# Patient Record
Sex: Female | Born: 1940 | Race: White | Hispanic: No | Smoking: Former smoker
Health system: Southern US, Community
[De-identification: ages and names within clinical notes are randomized; demographics above are authoritative.]

## PROBLEM LIST (undated history)

## (undated) DIAGNOSIS — M1990 Unspecified osteoarthritis, unspecified site: Secondary | ICD-10-CM

## (undated) DIAGNOSIS — I38 Endocarditis, valve unspecified: Secondary | ICD-10-CM

## (undated) DIAGNOSIS — Z8669 Personal history of other diseases of the nervous system and sense organs: Secondary | ICD-10-CM

## (undated) DIAGNOSIS — K219 Gastro-esophageal reflux disease without esophagitis: Secondary | ICD-10-CM

## (undated) DIAGNOSIS — Z8719 Personal history of other diseases of the digestive system: Secondary | ICD-10-CM

## (undated) DIAGNOSIS — N83202 Unspecified ovarian cyst, left side: Secondary | ICD-10-CM

## (undated) DIAGNOSIS — I5022 Chronic systolic (congestive) heart failure: Secondary | ICD-10-CM

## (undated) DIAGNOSIS — I340 Nonrheumatic mitral (valve) insufficiency: Secondary | ICD-10-CM

## (undated) DIAGNOSIS — T8189XA Other complications of procedures, not elsewhere classified, initial encounter: Secondary | ICD-10-CM

## (undated) DIAGNOSIS — E119 Type 2 diabetes mellitus without complications: Secondary | ICD-10-CM

## (undated) DIAGNOSIS — I071 Rheumatic tricuspid insufficiency: Secondary | ICD-10-CM

## (undated) DIAGNOSIS — R0602 Shortness of breath: Secondary | ICD-10-CM

## (undated) DIAGNOSIS — Z972 Presence of dental prosthetic device (complete) (partial): Secondary | ICD-10-CM

## (undated) DIAGNOSIS — I10 Essential (primary) hypertension: Secondary | ICD-10-CM

## (undated) DIAGNOSIS — F419 Anxiety disorder, unspecified: Secondary | ICD-10-CM

## (undated) DIAGNOSIS — I4891 Unspecified atrial fibrillation: Secondary | ICD-10-CM

## (undated) DIAGNOSIS — M4800 Spinal stenosis, site unspecified: Secondary | ICD-10-CM

## (undated) DIAGNOSIS — K279 Peptic ulcer, site unspecified, unspecified as acute or chronic, without hemorrhage or perforation: Secondary | ICD-10-CM

## (undated) DIAGNOSIS — I493 Ventricular premature depolarization: Secondary | ICD-10-CM

## (undated) DIAGNOSIS — H409 Unspecified glaucoma: Secondary | ICD-10-CM

## (undated) DIAGNOSIS — I428 Other cardiomyopathies: Secondary | ICD-10-CM

## (undated) DIAGNOSIS — E039 Hypothyroidism, unspecified: Secondary | ICD-10-CM

## (undated) DIAGNOSIS — N3941 Urge incontinence: Secondary | ICD-10-CM

## (undated) HISTORY — DX: Unspecified glaucoma: H40.9

## (undated) HISTORY — DX: Unspecified osteoarthritis, unspecified site: M19.90

## (undated) HISTORY — PX: TUBAL LIGATION: SHX77

## (undated) HISTORY — PX: VENTRAL HERNIA REPAIR: SHX424

## (undated) HISTORY — PX: HERNIA REPAIR: SHX51

## (undated) HISTORY — PX: TONSILLECTOMY: SUR1361

## (undated) HISTORY — DX: Spinal stenosis, site unspecified: M48.00

## (undated) HISTORY — PX: ABDOMINAL HERNIA REPAIR: SHX539

## (undated) HISTORY — PX: CARDIOVASCULAR STRESS TEST: SHX262

## (undated) HISTORY — PX: THYROIDECTOMY: SHX17

## (undated) HISTORY — DX: Anxiety disorder, unspecified: F41.9

## (undated) HISTORY — PX: OTHER SURGICAL HISTORY: SHX169

## (undated) HISTORY — DX: Ventricular premature depolarization: I49.3

## (undated) HISTORY — DX: Essential (primary) hypertension: I10

## (undated) HISTORY — DX: Other cardiomyopathies: I42.8

## (undated) HISTORY — DX: Endocarditis, valve unspecified: I38

---

## 1976-06-16 HISTORY — PX: GLAUCOMA SURGERY: SHX656

## 1977-06-16 HISTORY — PX: APPENDECTOMY: SHX54

## 1998-01-09 ENCOUNTER — Other Ambulatory Visit: Admission: RE | Admit: 1998-01-09 | Discharge: 1998-01-09 | Payer: Self-pay | Admitting: *Deleted

## 1998-02-02 ENCOUNTER — Other Ambulatory Visit: Admission: RE | Admit: 1998-02-02 | Discharge: 1998-02-02 | Payer: Self-pay | Admitting: *Deleted

## 1998-10-31 ENCOUNTER — Ambulatory Visit (HOSPITAL_COMMUNITY): Admission: RE | Admit: 1998-10-31 | Discharge: 1998-10-31 | Payer: Self-pay | Admitting: Cardiology

## 1999-07-02 ENCOUNTER — Other Ambulatory Visit: Admission: RE | Admit: 1999-07-02 | Discharge: 1999-07-02 | Payer: Self-pay | Admitting: *Deleted

## 2001-03-21 ENCOUNTER — Encounter: Payer: Self-pay | Admitting: Family Medicine

## 2001-03-21 ENCOUNTER — Encounter: Admission: RE | Admit: 2001-03-21 | Discharge: 2001-03-21 | Payer: Self-pay | Admitting: Family Medicine

## 2001-08-11 ENCOUNTER — Other Ambulatory Visit: Admission: RE | Admit: 2001-08-11 | Discharge: 2001-08-11 | Payer: Self-pay | Admitting: *Deleted

## 2002-08-17 ENCOUNTER — Other Ambulatory Visit: Admission: RE | Admit: 2002-08-17 | Discharge: 2002-08-17 | Payer: Self-pay | Admitting: *Deleted

## 2003-09-06 ENCOUNTER — Other Ambulatory Visit: Admission: RE | Admit: 2003-09-06 | Discharge: 2003-09-06 | Payer: Self-pay | Admitting: *Deleted

## 2004-06-05 ENCOUNTER — Ambulatory Visit (HOSPITAL_BASED_OUTPATIENT_CLINIC_OR_DEPARTMENT_OTHER): Admission: RE | Admit: 2004-06-05 | Discharge: 2004-06-05 | Payer: Self-pay | Admitting: Orthopedic Surgery

## 2004-06-05 ENCOUNTER — Ambulatory Visit (HOSPITAL_COMMUNITY): Admission: RE | Admit: 2004-06-05 | Discharge: 2004-06-05 | Payer: Self-pay | Admitting: Orthopedic Surgery

## 2004-06-05 HISTORY — PX: KNEE ARTHROSCOPY W/ MENISCECTOMY: SHX1879

## 2004-09-16 ENCOUNTER — Other Ambulatory Visit: Admission: RE | Admit: 2004-09-16 | Discharge: 2004-09-16 | Payer: Self-pay | Admitting: *Deleted

## 2005-12-10 ENCOUNTER — Other Ambulatory Visit: Admission: RE | Admit: 2005-12-10 | Discharge: 2005-12-10 | Payer: Self-pay | Admitting: *Deleted

## 2008-04-13 ENCOUNTER — Inpatient Hospital Stay (HOSPITAL_COMMUNITY): Admission: AD | Admit: 2008-04-13 | Discharge: 2008-04-15 | Payer: Self-pay | Admitting: Family Medicine

## 2008-04-13 ENCOUNTER — Ambulatory Visit: Payer: Self-pay | Admitting: Family Medicine

## 2008-04-21 ENCOUNTER — Encounter: Admission: RE | Admit: 2008-04-21 | Discharge: 2008-04-21 | Payer: Self-pay | Admitting: General Surgery

## 2008-05-12 ENCOUNTER — Encounter: Admission: RE | Admit: 2008-05-12 | Discharge: 2008-05-12 | Payer: Self-pay | Admitting: General Surgery

## 2008-08-18 ENCOUNTER — Encounter: Admission: RE | Admit: 2008-08-18 | Discharge: 2008-08-18 | Payer: Self-pay | Admitting: Family Medicine

## 2008-08-23 ENCOUNTER — Encounter: Admission: RE | Admit: 2008-08-23 | Discharge: 2008-08-23 | Payer: Self-pay | Admitting: Family Medicine

## 2009-01-01 ENCOUNTER — Encounter: Admission: RE | Admit: 2009-01-01 | Discharge: 2009-01-01 | Payer: Self-pay | Admitting: Family Medicine

## 2009-12-03 ENCOUNTER — Encounter: Admission: RE | Admit: 2009-12-03 | Discharge: 2009-12-03 | Payer: Self-pay | Admitting: General Surgery

## 2009-12-24 ENCOUNTER — Encounter: Admission: RE | Admit: 2009-12-24 | Discharge: 2009-12-24 | Payer: Self-pay | Admitting: Family Medicine

## 2010-02-01 ENCOUNTER — Ambulatory Visit: Payer: Self-pay | Admitting: Cardiology

## 2010-02-22 ENCOUNTER — Ambulatory Visit: Payer: Self-pay | Admitting: Cardiology

## 2010-04-12 ENCOUNTER — Ambulatory Visit: Payer: Self-pay | Admitting: Cardiovascular Disease

## 2010-07-12 DIAGNOSIS — R5383 Other fatigue: Secondary | ICD-10-CM

## 2010-07-12 DIAGNOSIS — I509 Heart failure, unspecified: Secondary | ICD-10-CM | POA: Insufficient documentation

## 2010-07-12 DIAGNOSIS — M19049 Primary osteoarthritis, unspecified hand: Secondary | ICD-10-CM | POA: Insufficient documentation

## 2010-07-12 DIAGNOSIS — E119 Type 2 diabetes mellitus without complications: Secondary | ICD-10-CM | POA: Insufficient documentation

## 2010-07-12 DIAGNOSIS — H409 Unspecified glaucoma: Secondary | ICD-10-CM | POA: Insufficient documentation

## 2010-07-12 DIAGNOSIS — R5381 Other malaise: Secondary | ICD-10-CM | POA: Insufficient documentation

## 2010-07-12 DIAGNOSIS — I10 Essential (primary) hypertension: Secondary | ICD-10-CM | POA: Insufficient documentation

## 2010-07-12 DIAGNOSIS — M19079 Primary osteoarthritis, unspecified ankle and foot: Secondary | ICD-10-CM | POA: Insufficient documentation

## 2010-07-16 ENCOUNTER — Ambulatory Visit: Payer: Self-pay | Admitting: Cardiology

## 2010-07-17 ENCOUNTER — Other Ambulatory Visit (HOSPITAL_COMMUNITY): Payer: Self-pay

## 2010-07-26 ENCOUNTER — Ambulatory Visit (HOSPITAL_COMMUNITY): Payer: Medicare Other | Attending: Cardiology

## 2010-07-26 DIAGNOSIS — E119 Type 2 diabetes mellitus without complications: Secondary | ICD-10-CM | POA: Insufficient documentation

## 2010-07-26 DIAGNOSIS — I079 Rheumatic tricuspid valve disease, unspecified: Secondary | ICD-10-CM | POA: Insufficient documentation

## 2010-07-26 DIAGNOSIS — I428 Other cardiomyopathies: Secondary | ICD-10-CM | POA: Insufficient documentation

## 2010-07-26 DIAGNOSIS — I080 Rheumatic disorders of both mitral and aortic valves: Secondary | ICD-10-CM | POA: Insufficient documentation

## 2010-07-26 DIAGNOSIS — I10 Essential (primary) hypertension: Secondary | ICD-10-CM | POA: Insufficient documentation

## 2010-07-26 DIAGNOSIS — I379 Nonrheumatic pulmonary valve disorder, unspecified: Secondary | ICD-10-CM | POA: Insufficient documentation

## 2010-07-26 DIAGNOSIS — F17200 Nicotine dependence, unspecified, uncomplicated: Secondary | ICD-10-CM | POA: Insufficient documentation

## 2010-09-12 ENCOUNTER — Encounter: Payer: Self-pay | Admitting: Cardiology

## 2010-09-12 ENCOUNTER — Encounter: Payer: Self-pay | Admitting: Nurse Practitioner

## 2010-09-12 ENCOUNTER — Ambulatory Visit (INDEPENDENT_AMBULATORY_CARE_PROVIDER_SITE_OTHER): Payer: Medicare Other | Admitting: Nurse Practitioner

## 2010-09-12 ENCOUNTER — Telehealth: Payer: Self-pay | Admitting: *Deleted

## 2010-09-12 VITALS — BP 120/78 | HR 72 | Ht 63.0 in | Wt 143.6 lb

## 2010-09-12 DIAGNOSIS — Z79899 Other long term (current) drug therapy: Secondary | ICD-10-CM

## 2010-09-12 DIAGNOSIS — I428 Other cardiomyopathies: Secondary | ICD-10-CM

## 2010-09-12 DIAGNOSIS — E119 Type 2 diabetes mellitus without complications: Secondary | ICD-10-CM

## 2010-09-12 DIAGNOSIS — Z720 Tobacco use: Secondary | ICD-10-CM

## 2010-09-12 DIAGNOSIS — F17200 Nicotine dependence, unspecified, uncomplicated: Secondary | ICD-10-CM

## 2010-09-12 DIAGNOSIS — I10 Essential (primary) hypertension: Secondary | ICD-10-CM

## 2010-09-12 LAB — LIPID PANEL: Triglycerides: 150 mg/dL — ABNORMAL HIGH (ref 0.0–149.0)

## 2010-09-12 LAB — COMPREHENSIVE METABOLIC PANEL
Calcium: 9.3 mg/dL (ref 8.4–10.5)
Creatinine, Ser: 0.9 mg/dL (ref 0.4–1.2)
Sodium: 144 mEq/L (ref 135–145)
Total Protein: 6.6 g/dL (ref 6.0–8.3)

## 2010-09-12 MED ORDER — CARVEDILOL 12.5 MG PO TABS: 12.5000 mg | ORAL_TABLET | Freq: Two times a day (BID) | ORAL | Status: DC

## 2010-09-12 NOTE — Telephone Encounter (Signed)
Pt notified of appointment with Dr. Caryl Comes on June 8 at 1015 am to discuss ICD implantation.

## 2010-09-12 NOTE — Progress Notes (Signed)
History of Present Illness: Teresa [name] comes in today for a follow up visit. She is seen for Dr. Doreatha Lew. She seems fairly well compensated from the standpoint of her heart failure. Her repeat echo last month still shows an EF of 25%. We discussed possible ICD implant and she is interested once she is out for the summer. She denies chest pain. She is not really short of breath. She does have some cramps in her feet. Her weight has been stable. She has had one episode of getting dizzy after she had been bent over working in the yard. She has not noticed any palpitations.  Current Outpatient Prescriptions on File Prior to Visit  Medication Sig Dispense Refill  . ALPRAZolam (NIRAVAM) 0.25 MG dissolvable tablet Take 0.25 mg by mouth 3 (three) times daily.        . fish oil-omega-3 fatty acids 1000 MG capsule Take 2 g by mouth 2 (two) times daily.        . furosemide (LASIX) 20 MG tablet Take 40 mg by mouth as directed. Taking one tablet (40mg ) in the am and taking 1/2 tab at night      . HYDROcodone-acetaminophen (VICODIN) 5-500 MG per tablet Take 1 tablet by mouth 3 (three) times daily.        Marland Kitchen levothyroxine (SYNTHROID, LEVOTHROID) 112 MCG tablet Take 112 mcg by mouth daily.        Marland Kitchen losartan (COZAAR) 50 MG tablet Take 50 mg by mouth daily.        . carvedilol (COREG) 12.5 MG tablet Take 1 tablet (12.5 mg total) by mouth 2 (two) times daily.  60 tablet  11    No Known Allergies  Past Medical History  Diagnosis Date  . Nonischemic cardiomyopathy     EF is 25% per echo February 2012  . HTN (hypertension)   . PVC (premature ventricular contraction)   . Diabetes mellitus   . Tobacco abuse   . Heart palpitations   . Valvular heart disease     Moderate to severe MR, moderate TR, LAE per echo 7/11  . Glaucoma   . Glaucoma     history of  . Cardiomyopathy, nonischemic   . PVC (premature ventricular contraction)   . Spinal stenosis     Past Surgical History  Procedure Date  . Knee arthroscopy    . Tubal ligation     History  Smoking status  . Current Everyday Smoker  . Types: Cigarettes  Smokeless tobacco  . Never Used    History  Alcohol Use No    Family History  Problem Relation Age of Onset  . Stroke Brother     Review of Systems: The review of systems is as above.  All other systems were reviewed and are negative.  Physical Exam: BP 120/78  Pulse 72  Ht 5\' 3"  (1.6 m)  Wt 143 lb 9.6 oz (65.137 kg)  BMI 25.44 kg/m2 She is pleasant and in no acute distress. Skin is warm and dry. Color is normal. She smells of tobacco. HEENT is unremarkable. Lungs are coarse. Cardiac exam shows a regular rate and rhythm. She has occasional ectopics. She has no edema. Gait and ROM are intact. She has no gross neurologic deficits.   ECG: N/A  ECHO from 2/12 shows EF still at 25%  Assessment / Plan:

## 2010-09-12 NOTE — Telephone Encounter (Signed)
Message copied by Raelene Bott on Thu Sep 12, 2010  2:00 PM ------      Message from: Truitt Merle      Created: Thu Sep 12, 2010  9:49 AM       Needs an appointment in June with Dr. Caryl Comes to discuss ICD implantation.             Teresa [name]

## 2010-09-12 NOTE — Assessment & Plan Note (Signed)
Blood pressure is satisfactory.

## 2010-09-12 NOTE — Assessment & Plan Note (Signed)
We discussed ICD implant. She would like to see Dr. Caryl Comes in June. I have increased her Coreg to 12.5mg  bid. I will see her back in 1 month to reassess. I think we need to try and maximize her medical regimen.

## 2010-09-12 NOTE — Assessment & Plan Note (Signed)
She is counseled once again about her tobacco. She is not ready to quit at this time.

## 2010-09-12 NOTE — Patient Instructions (Addendum)
Smoking cessation is encouraged. Continue with your current meds except we are going to increase your Coreg to 12.5 mg BID I will see you in one month.

## 2010-09-16 ENCOUNTER — Telehealth: Payer: Self-pay | Admitting: *Deleted

## 2010-09-16 NOTE — Telephone Encounter (Signed)
LM to call back for lab results.  

## 2010-09-16 NOTE — Telephone Encounter (Signed)
Pt notified of lab results and will try to restart Simvastatin 20 mg daily.  Pt will f/u with Cecille Rubin on 10/18/10.

## 2010-09-16 NOTE — Telephone Encounter (Signed)
Message copied by Raelene Bott on Mon Sep 16, 2010  2:05 PM ------      Message from: Truitt Merle      Created: Fri Sep 13, 2010  8:07 AM       Lipids are not good. Does not look like she is on therapy. Will discuss at return visit in one month.

## 2010-10-11 ENCOUNTER — Other Ambulatory Visit: Payer: Self-pay | Admitting: *Deleted

## 2010-10-11 DIAGNOSIS — I509 Heart failure, unspecified: Secondary | ICD-10-CM

## 2010-10-11 MED ORDER — FUROSEMIDE 40 MG PO TABS: ORAL_TABLET | ORAL | Status: DC

## 2010-10-18 ENCOUNTER — Encounter: Payer: Self-pay | Admitting: Nurse Practitioner

## 2010-10-18 ENCOUNTER — Telehealth: Payer: Self-pay | Admitting: *Deleted

## 2010-10-18 ENCOUNTER — Ambulatory Visit (INDEPENDENT_AMBULATORY_CARE_PROVIDER_SITE_OTHER): Payer: Medicare Other | Admitting: Nurse Practitioner

## 2010-10-18 DIAGNOSIS — I10 Essential (primary) hypertension: Secondary | ICD-10-CM

## 2010-10-18 DIAGNOSIS — I428 Other cardiomyopathies: Secondary | ICD-10-CM

## 2010-10-18 DIAGNOSIS — R0600 Dyspnea, unspecified: Secondary | ICD-10-CM

## 2010-10-18 DIAGNOSIS — F17200 Nicotine dependence, unspecified, uncomplicated: Secondary | ICD-10-CM

## 2010-10-18 DIAGNOSIS — R079 Chest pain, unspecified: Secondary | ICD-10-CM | POA: Insufficient documentation

## 2010-10-18 DIAGNOSIS — Z720 Tobacco use: Secondary | ICD-10-CM

## 2010-10-18 DIAGNOSIS — R0609 Other forms of dyspnea: Secondary | ICD-10-CM

## 2010-10-18 DIAGNOSIS — I509 Heart failure, unspecified: Secondary | ICD-10-CM

## 2010-10-18 LAB — BASIC METABOLIC PANEL
BUN: 22 mg/dL (ref 6–23)
CO2: 26 mEq/L (ref 19–32)
Calcium: 9 mg/dL (ref 8.4–10.5)
Chloride: 105 mEq/L (ref 96–112)
Creatinine, Ser: 0.8 mg/dL (ref 0.4–1.2)
GFR: 74.31 mL/min (ref >60.00)
Glucose, Bld: 115 mg/dL — ABNORMAL HIGH (ref 70–99)
Potassium: 4 mEq/L (ref 3.5–5.1)
Sodium: 139 mEq/L (ref 135–145)

## 2010-10-18 LAB — BRAIN NATRIURETIC PEPTIDE: Pro B Natriuretic peptide (BNP): 268 pg/mL — ABNORMAL HIGH (ref 0.0–100.0)

## 2010-10-18 MED ORDER — CARVEDILOL 25 MG PO TABS: 25.0000 mg | ORAL_TABLET | Freq: Two times a day (BID) | ORAL | Status: DC

## 2010-10-18 NOTE — Patient Instructions (Addendum)
We are going to check some labs today. Continue with your current medicines, except we are going to increase the Coreg to 25 mg BID I have sent a new prescription to [facility]. Use 2 of your 12.5 mg tablets 2 x day to use them up. Weigh yourself each morning and record. Take extra dose of diuretic for weight gain of 3 pounds in 24 hours. Limit sodium intake.  See Dr. Caryl Comes next week. I will see you in 2 months.

## 2010-10-18 NOTE — Assessment & Plan Note (Signed)
Once again, she is encouraged to stop.

## 2010-10-18 NOTE — Assessment & Plan Note (Addendum)
She appears to be NYHA Class 2. I have increased her Coreg on up to 25 mg BID. New prescription is sent to [facility]. I have asked her to take an extra dose of her Lasix today to get her weight back down. She is on ARB. We will consider adding Aldactone on her return visit. She will see Dr. Caryl Comes next week for ICD consideration. Patient is agreeable to this plan and will call if any problems develop in the interim.

## 2010-10-18 NOTE — Assessment & Plan Note (Signed)
Coreg is increased today.

## 2010-10-18 NOTE — Progress Notes (Signed)
Teresa [name] Date of Birth: 08-01-1940   History of Present Illness: Teresa [name] is seen back today for a follow up visit. She is seen for Dr. Doreatha [name]. She is doing well. She actually feels a little stronger. She is only short of breath with going up hills. She is now on 12.5mg  of Coreg BID. She has an appointment to see Dr. Caryl [name] on Tuesday for an ICD. She notes that her weight has gone up over night. She has not used extra Lasix. She has a few "pings" of chest pain. No edema. She is not bloated. She can lie flat and has no PND. She continues to smoke.   Current Outpatient Prescriptions on File Prior to Visit  Medication Sig Dispense Refill  . ALPRAZolam (NIRAVAM) 0.25 MG dissolvable tablet Take 0.25 mg by mouth at bedtime as needed.       . carvedilol (COREG) 12.5 MG tablet Take 1 tablet (12.5 mg total) by mouth 2 (two) times daily.  60 tablet  11  . fish oil-omega-3 fatty acids 1000 MG capsule Take 2 g by mouth 2 (two) times daily.        Marland Kitchen HYDROcodone-acetaminophen (VICODIN) 5-500 MG per tablet Take 1 tablet by mouth 3 (three) times daily.        Marland Kitchen levothyroxine (SYNTHROID, LEVOTHROID) 112 MCG tablet Take 112 mcg by mouth daily.        Marland Kitchen losartan (COZAAR) 50 MG tablet Take 50 mg by mouth daily.        . simvastatin (ZOCOR) 40 MG tablet Take 40 mg by mouth 3 (three) times a week.        Marland Kitchen DISCONTD: furosemide (LASIX) 40 MG tablet Take one to two tablets daily as needed.  60 tablet  6    No Known Allergies  Past Medical History  Diagnosis Date  . Nonischemic cardiomyopathy     EF is 25% per echo February 2012  . HTN (hypertension)   . PVC (premature ventricular contraction)   . Diabetes mellitus   . Tobacco abuse   . Heart palpitations   . Valvular heart disease     Moderate to severe MR, moderate TR, LAE per echo 7/11  . Glaucoma   . Glaucoma     history of  . Cardiomyopathy, nonischemic   . PVC (premature ventricular contraction)   . Spinal stenosis     Past Surgical  History  Procedure Date  . Knee arthroscopy   . Tubal ligation     History  Smoking status  . Current Everyday Smoker  . Types: Cigarettes  Smokeless tobacco  . Never Used    History  Alcohol Use No    Family History  Problem Relation Age of Onset  . Stroke Brother     Review of Systems: The review of systems is positive for chronic pain. She has just switched to a new PCP.  She is not lightheaded. She denies palpitations. She continues to work with no significant limitations. All other systems were reviewed and are negative.  Physical Exam: BP 140/90  Pulse 73  Ht 5\' 3"  (1.6 m)  Wt 148 lb 9.6 oz (67.405 kg)  BMI 26.32 kg/m2 Patient is very pleasant and in no acute distress. She smells of tobacco. Skin is warm and dry. Color is normal.  HEENT is unremarkable. Normocephalic/atraumatic. PERRL. Sclera are nonicteric. Neck is supple. No masses. No JVD. Lungs are coarse with a few wheezes. Cardiac exam shows a regular rate and rhythm.  Abdomen is soft. Extremities are without edema. Gait and ROM are intact. No gross neurologic deficits noted.  LABORATORY DATA:   BMET and BNP are pending. EKG today shows sinus with a rate of 72. She has diffuse T wave changes. The tracing is unchanged from prior tracing.    Assessment / Plan:

## 2010-10-18 NOTE — Telephone Encounter (Signed)
Labs reported

## 2010-10-18 NOTE — Assessment & Plan Note (Signed)
I don't think her "pings" are angina. She will continue to monitor.

## 2010-10-22 ENCOUNTER — Ambulatory Visit (INDEPENDENT_AMBULATORY_CARE_PROVIDER_SITE_OTHER): Payer: Medicare Other | Admitting: Internal Medicine

## 2010-10-22 ENCOUNTER — Encounter: Payer: Self-pay | Admitting: Internal Medicine

## 2010-10-22 DIAGNOSIS — I10 Essential (primary) hypertension: Secondary | ICD-10-CM

## 2010-10-22 DIAGNOSIS — Z720 Tobacco use: Secondary | ICD-10-CM

## 2010-10-22 DIAGNOSIS — F17200 Nicotine dependence, unspecified, uncomplicated: Secondary | ICD-10-CM

## 2010-10-22 DIAGNOSIS — I428 Other cardiomyopathies: Secondary | ICD-10-CM

## 2010-10-22 MED ORDER — SPIRONOLACTONE 25 MG PO TABS: ORAL_TABLET | ORAL | Status: DC

## 2010-10-22 NOTE — Assessment & Plan Note (Signed)
Encouraged to stop smoking

## 2010-10-22 NOTE — Patient Instructions (Addendum)
Your physician has recommended you make the following change in your medication: Start Aldactone 25mg  1/2 tablet by mouth once daily.  Your physician recommends that you return for lab work in: 2 weeks- BMP (425.4;401.1)  Your physician has requested that you have an echocardiogram just prior to your follow up appointment. Echocardiography is a painless test that uses sound waves to create images of your heart. It provides your doctor with information about the size and shape of your heart and how well your heart's chambers and valves are working. This procedure takes approximately one hour. There are no restrictions for this procedure.  Your physician recommends that you schedule a follow-up appointment in: 10 weeks.

## 2010-10-22 NOTE — Assessment & Plan Note (Signed)
The patient has persistent cardiomyopathy. There has been gradual up titration of her medications with a change last week from 12-1/2-25 carvedilol. I would like to add Aldactone and reassess her LV function in about 10 weeks. If he remains significantly impaired, at that point ICD implantation is clearly appropriate as maximal oncological therapy would have been  pursued.

## 2010-10-22 NOTE — Progress Notes (Signed)
HPI: Teresa [name] is a 70 y.o. female Seen at the request of Dr. Doreatha Lew for consideration of ICD implantation.  She presented to cardiac care July 2011 symptoms of orthopnea nocturnal dyspnea dyspnea on exertion and peripheral edema. She was found to have an ejection fraction of 20-25% with moderate to severe mitral and tricuspid regurgitation. A Myoview was consistent with nonischemic cardiomyopathy. She's been treated medically. Her ejection fraction has remained in the 20-25% range over the last 9 months despite a extensive medical therapy. Her symptoms have largely resolved she is currently functioning class II.  She has a history of irregular palpitations which she thinks is atrial fibrillation; she also thinks she may have PVCs. She has not had syncope.  Currently she has no orthopnea nocturnal dyspnea or peripheral edema.  Her last echo was earlier this month demonstrated ejection fraction 20-25% with now mild MR/TR Current Outpatient Prescriptions  Medication Sig Dispense Refill  . ALPRAZolam (NIRAVAM) 0.25 MG dissolvable tablet Take 0.25 mg by mouth at bedtime as needed.       . carvedilol (COREG) 25 MG tablet Take 1 tablet (25 mg total) by mouth 2 (two) times daily.  60 tablet  11  . fish oil-omega-3 fatty acids 1000 MG capsule Take 2 g by mouth 2 (two) times daily.        . furosemide (LASIX) 40 MG tablet 40 mg 2 (two) times daily. Take one  IN THE AM, AND 1/2 AT NIGHT.      Marland Kitchen HYDROcodone-acetaminophen (VICODIN) 5-500 MG per tablet Take 1 tablet by mouth 3 (three) times daily.        Marland Kitchen levothyroxine (SYNTHROID, LEVOTHROID) 112 MCG tablet Take 112 mcg by mouth daily.        Marland Kitchen losartan (COZAAR) 50 MG tablet Take 50 mg by mouth daily.        . simvastatin (ZOCOR) 40 MG tablet Take 40 mg by mouth 3 (three) times a week.          No Known Allergies  Past Medical History  Diagnosis Date  . Nonischemic cardiomyopathy     EF is 25% per echo February 2012  . HTN (hypertension)   .  PVC (premature ventricular contraction)   . Diabetes mellitus   . Tobacco abuse   . Heart palpitations   . Valvular heart disease     Moderate to severe MR, moderate TR, LAE per echo 7/11  . Glaucoma   . Glaucoma     history of  . Cardiomyopathy, nonischemic   . PVC (premature ventricular contraction)   . Spinal stenosis     Past Surgical History  Procedure Date  . Knee arthroscopy   . Tubal ligation     Family History  Problem Relation Age of Onset  . Stroke Brother     History   Social History  . Marital Status: Married    Spouse Name: N/A    Number of Children: N/A  . Years of Education: N/A   Occupational History  . Not on file.   Social History Main Topics  . Smoking status: Current Everyday Smoker    Types: Cigarettes  . Smokeless tobacco: Never Used  . Alcohol Use: No  . Drug Use: No  . Sexually Active:    Other Topics Concern  . Not on file   Social History Narrative  . No narrative on file    Fourteen point review of systems was negative except as noted in HPI and PMH  apart from anxiety; pain in her feet for bone spurs and chronic systemic pain for which she takes narcotics  PHYSICAL EXAMINATION  Blood pressure 140/68, pulse 66, height 5\' 3"  (1.6 m), weight 146 lb 12.8 oz (66.588 kg).   Well developed and nourished The Caucasian female appearing her stated age in no acute distress HENT normal with alopecia Neck supple with JVP-flat Carotids brisk and full without bruits Back without scoliosis or kyphosis Clear Irregular rate and rhythm consistent with PVCs PMI is displaced , no murmurs or gallops Abd-soft with active BS without hepatomegaly or midline pulsation Femoral pulses 2+ distal pulses intact No Clubbing cyanosis edema Skin-warm and dry LN-neg submandibular and supraclavicular A & Oriented CN 3-12 normal  Grossly normal sensory and motor function Affect engaging . ECG from last week demonstrated sinus rhythm at 73.16  5.09/0.37 Nonspecific ST-T changes

## 2010-10-22 NOTE — Assessment & Plan Note (Signed)
Blood pressure remained mildly elevated which gives [place] a chance to Aldactone. We'll check a metabolic profile in 2 weeks.

## 2010-10-29 NOTE — H&P (Signed)
NAMEEVEA, STANCO                ACCOUNT NO.:  [id number]   MEDICAL RECORD NO.:  [id number]          PATIENT TYPE:  INP   LOCATION:  4710                         FACILITY:  [facility]   PHYSICIAN:  Dalbert Mayotte, MD        DATE OF BIRTH:  1940-12-19   DATE OF ADMISSION:  04/13/2008  DATE OF DISCHARGE:                              HISTORY & PHYSICAL   PRIMARY CARE PHYSICIAN:  Suszanne Conners, MD   CHIEF COMPLAINT:  Abdominal pain, nausea, and vomiting.   HISTORY OF PRESENT ILLNESS:  Ms. Parchment is a 70 year old female with a  history of multiple abdominal surgeries, hypertension, diabetes,  hyperlipidemia, and hypothyroidism who presents with abdominal pain,  nausea, and vomiting since this past Tuesday night.  She states that she  ate dinner and then after that started having abdominal pain and felt  like she had gas, but Gas-X did not help.  She then continually has been  vomiting, initially bringing up food and then bringing up dark green  substance today.  Most recently, she has been having vomiting, but no  nausea and the pain has become worsen in her left lower quadrant.  She  has not passed gas or had a bowel movement since this past Tuesday.  She  denied any melena, blood in her stool, diarrhea, or constipation  previous to this.  She also states that she has not been urinating since  this morning.  She denies fever, but does report feeling cold and  clammy.  She went to urgent care today where a KUB was done that was  suspicious for a small bowel obstruction.  She was then directly  admitted to the hospital.   PAST MEDICAL HISTORY:  1. Hyperlipidemia.  2. Hypothyroidism status post thyroidectomy.  3. Osteoarthritis.  4. Hypertension, now not taking any medications since weight loss.  5. GERD.  6. History of peptic ulcer disease, but no currently active ulcers.  7. Type 2 diabetes.   PAST SURGICAL HISTORY:  1. Appendectomy approximately 30 years ago.  2. Five to six  abdominal hernia repairs in 5-10 years following her      appendectomy.  3. Thyroidectomy.  4. Medial meniscal tear repair in 2005.   MEDICATIONS:  1. Zocor 40 mg p.o. daily.  2. Metformin 500 mg p.o. b.i.d.  3. Synthroid 125 mg p.o. daily.  4. Pepcid AC.  5. Xanax p.r.n.  6. Darvocet p.r.n.  7. Hydrocodone p.r.n.  8. Gas-X p.r.n.   ALLERGIES:  No known drug allergies.   SOCIAL HISTORY:  The patient lives home alone and her husband is in the  nursing home.  She smokes 1/2 pack a day with a long-standing smoking  history.  She denies any alcohol or drugs.   FAMILY HISTORY:  The patient states diabetes, stroke, MI, cancer all run  in her family.  She has 7 siblings.  She states that she has a daughter  with cancer, which she describes as a tumor between her heart and her  lungs.  She also has a son with heart failure.   REVIEW  OF SYSTEMS:  GENERAL:  Endorses chills and sweats.  Denies  fevers.  HEENT:  Denies headache or sore throat.  CARDIOVASCULAR:  Denies chest pain or edema.  RESPIRATORY:  Denies cough or dyspnea.  GI:  Nausea, vomiting, and abdominal pain as per HPI.  Denies diarrhea,  hemoptysis, bright red per rectum, or melena.  GU:  Denies dysuria,  however, notes decreased urine output today.  SKIN:  Denies rash.  MUSCULOSKELETAL:  Denies myalgias.  Endorses multiple arthralgias due to  her osteoarthritis.  NEURO:  Endorses some feeling or weakness.  ENDO:  Denies heat or cold intolerance.   PHYSICAL EXAMINATION:  VITAL SIGNS:  Temperature 98.0, pulse 102-108,  respirations 20, blood pressure 136-146/64-72, and pulse ox 94-95% on  room air.  GENERAL:  Lying in bed, in no acute distress.  HEENT:  Somewhat dry and tacky mucous membranes.  No erythema or exudate  of the oropharynx.  Pupils equal, round, and reactive to light and  accommodation.  Extraocular movements intact.  NECK:  Supple.  No carotid bruits.  CARDIOVASCULAR:  Regular rate and rhythm.  No murmurs,  rubs, or gallops.  LUNGS:  Clear to auscultation bilaterally.  No wheezes, rhonchi, or  rales.  ABDOMEN:  Soft and nondistended with slight tenderness to palpation in  the left lower quadrant mainly, but diffuse.  No rebound or guarding.  No masses or hepatosplenomegaly.  Bowel sounds present but decreased.  BACK:  No lesions.  GU:  Deferred.  RECTAL:  Deferred.  EXTREMITIES:  1+ nonpitting lower extremity edema bilaterally.  DP  pulses 2+ bilaterally.  NEURO:  Cranial nerves II through XII intact.  Normal sensation  throughout.  MUSCULOSKELETAL:  Normal strength throughout.   LABORATORY DATA AND STUDIES:  EKG shows some possible ST depression in  leads V4 through V6 but difficult to tell if this is a change from  previous EKGs.  KUB per verbal report from urgent care, large dilated  bowel and air-fluid levels.  The formal report is not available.   ASSESSMENT AND PLAN:  Ms. Pellicane is a 70 year old female with history of  multiple abdominal surgeries who presents with abdominal pain, nausea,  and vomiting x3 days.  1. Small bowel obstruction:  This is the likely diagnosis based on x-      ray findings and the patient's history of multiple abdominal      surgeries.  We will order an acute abdominal series now and a      repeat in the morning.  The patient is n.p.o. entirely and we are      holding all of her p.o. medications.  We will drop an NG tube and      set it to intermittent suction.  The patient should receive      abdominal exams every 2 hours.  Surgery has been consulted and they      agree with this plan, and we will further evaluate the patient in      the morning.  We will also hydrate the patient with IV fluids 1 L      bolus of normal saline now followed by normal saline at 125 mL per      hour.  The patient will have strict Is and Os, and we will order a      CBC and a complete metabolic panel now.  We have given the patient      Zofran, Ativan, and morphine as  needed.  2. EKG, ST  changes:  This represents possibly new ST depression on      EKG.  Given the patient's comorbidities, we will rule out      myocardial infarction though the patient has not endorsed any chest      pain.  We will cycle cardiac enzymes x3 and repeat an EKG in the      morning.  The patient is also on telemetry.  3. Diabetes:  We will hold metformin in case the patient requires any      studies that require contrast.  The patient is on sliding scale      insulin.  4. Hypothyroidism:  We will continue the patient's Synthroid.  Fluids,      electrolytes, nutrition and prophylaxis:  The patient is n.p.o.      now, we are hydrating the patient with 1 L of bolus of normal      saline followed by 125 mL per hour of normal saline.  We will also      give the patient Protonix 40 mg IV daily.      Carin Hock, MD  Electronically Signed      Dalbert Mayotte, MD  Electronically Signed    SO/MEDQ  D:  04/13/2008  T:  04/14/2008  Job:  [id number]

## 2010-10-29 NOTE — Discharge Summary (Signed)
NAMESOILA, Teresa [name]                ACCOUNT NO.:  [id number]   MEDICAL RECORD NO.:  [id number]          PATIENT TYPE:  INP   LOCATION:  4710                         FACILITY:  [facility]   PHYSICIAN:  Jamal Collin. Hensel, M.D.DATE OF BIRTH:  [birth year]   DATE OF ADMISSION:  04/13/2008  DATE OF DISCHARGE:  04/15/2008                               DISCHARGE SUMMARY   DISCHARGE DIAGNOSES:  1. Partial small bowel obstruction, resolving.  2. Hyperlipidemia.  3. Hypothyroid.  4. Hypertension.  5. Type 2 diabetes.  6. Gastroesophageal reflux disease.  7. EKG changes - asymptomatic.   DISCHARGE MEDICATIONS:  1. Metformin 500 mg p.o. b.i.d.  2. Synthroid 0.125 mg p.o. daily.  3. Zocor 40 mg p.o. daily.  4. Darvocet p.r.n.  5. Xanax 0.5 mg p.r.n.  6. Gas-X p.r.n.  7. Pepcid a.c. p.o. nightly.   FOLLOWUP:  The patient was instructed on importance stressed to follow  up with [facility] on Sunday, April 16, 2008.  She will bring in the pink discharge sheet and we will hopefully get a  KUB x-ray.  The patient was also instructed to follow up with the office  of her primary care physician, Dr. Earlie Counts, which she will call for an  appointment in 1-2 weeks.   HOSPITAL COURSE:  This is a pleasant 70 year old female who was admitted  with severe nausea, vomiting, and unable to have a bowel movement and  found to have a partial small bowel obstruction at the [facility].  Repeat imaging also showed a partial small bowel obstruction.  Please see following for details:  1. Small bowel obstruction:  The patient was admitted and initially      was going to have an NG tube placed.  However, she had a bowel      movement fairly quickly and there was no need to place an NG tube.      Instead, we kept her n.p.o.  We kept her on aggressive IV fluid      hydration.  We repeated imaging her during hospital stay and we      consulted [facility].  The surgeons  felt that she was      not in need of surgery at this point and they recommended bowel      rest as well as repeat imaging.  Over hospital course, although the      repeat imaging did not appear to change the severity of the partial      small bowel obstruction, she clinically improved.  She was having      both loose and formed bowel movements.  She did not have any more      nausea and vomiting and she was tolerating a clear liquid diet.  On      day of discharge, the patient told us she did not want to stay and      she promised to have close followup the next day.  The surgeons      felt like one more day of observation with a repeat  KUB would      benefit her.  The patient was under undue stress with a sick family      member at the nursing home and we felt it would to be beneficial to      her to be able to go home and have close followup.  The most recent      KUB showed dilated bowel loops that was essentially unchanged.      However, the patient was remarkably better clinically.  2. Diabetes:  We held the patient's metformin while she was in the      hospital as she was n.p.o. and tolerating minimal diet.  She      received sliding scale only and her sugars remained in the 80s      throughout the hospital stay.  She was instructed to resume her      metformin once she was tolerating a regular diet.  3. Hypothyroid:  We initially put her on IV Synthroid; however, we      switched this to a p.o. dose once she was tolerating p.o.  4. Gastroesophageal reflux disease:  We we put the patient on Protonix      while she was here.  5. Hypertension:  The patient was on any medications and her blood      pressure remained well controlled during this hospital stay.  6. EKG changes:  It was noted that although the patient did not have      chest pain, a routine EKG was obtained in the emergency room given      the fact that she had nausea and vomiting and it was noted that      there were  some inverted T-waves in V1 through V6.  On compared to      previous x-rays, these were new findings.  However, the patient's      cardiac enzymes remained negative and she never had chest pain nor      shortness of breath throughout this hospital stay.  We hope that      her primary care physician can follow up this EKG and repeat      another one and likely have an outpatient stress test for further      cardiac workup.   PERTINENT IMAGES:  Initial acute abdominal series showed markedly  dilated loops of small bowel in the upper abdomen and stair-step air  fluid levels that coincide with the partial small bowel obstruction  without free intraperitoneal air that was on April 13, 2008, and  April 14, 2008.  Repeat imaging showed no significant change on  April 15, 2008, a KUB was performed which again showed no interval  change in the small bowel obstruction.  However, she clinically remained  improved.   PERTINENT LABORATORY DATA:  On the day of admission, April 13, 2008,  white count was normal at 7.7, hemoglobin 11.8, and platelets over  83,000, otherwise labs unremarkable.  Comprehensive metabolic panel on  April 13, 2008, showed a low potassium at 3.2 and elevated creatinine  1.69.  Normal LFTs.  AST 15 and ALT 12.  On the day of discharge, white  blood cells remained normal and hemoglobin remained stable at 11.4.  Basic metabolic panel showed potassium normal at 4.5 and her creatinine  normal now at 0.66.  Point-of-care enzymes remained negative.   DISCHARGE CONDITION:  Stable and improved.   ACTIVITY:  As tolerated.   FOLLOWUP:  Again, I would like  to reiterate that a fairly lengthy  discussion was had with this patient to discuss her request for early  discharge.  She understands the risk of having one less day of  observation to watch her small bowel obstruction.  She understands that  if she goes home she must have close followup the following day on  April 16, 2008, at the [facility] to  receive another KUB.  She understands that things and red flags to look  out for include severe nausea, vomiting, and unable to have bowel  movements as well as abdominal pain.  She understands that she is at  risk of having a small bowel obstruction worsen without having a  surgeon at hand to help her.  I do feel that she is stable and I have  talked our attending who agrees she is stable for discharge; however, we  did not want to go over these risks and red flags with her.  She is in  agreement and will follow up in the morning.      Kasandra Knudsen, M.D.  Electronically Signed      Jamal Collin. Andria Frames, M.D.  Electronically Signed    JT/MEDQ  D:  04/15/2008  T:  04/16/2008  Job:  [id number]   cc:   [facility]  Suszanne Conners, M.D.

## 2010-10-30 ENCOUNTER — Telehealth: Payer: Self-pay | Admitting: Internal Medicine

## 2010-10-30 NOTE — Telephone Encounter (Signed)
Pt calling to see if echo on 5-29 is too soon thought it was to be closer to July appt due to new med she's on, pls advise

## 2010-10-30 NOTE — Telephone Encounter (Signed)
I called and spoke with the pt. She does need her echo closer to her July appt. I have r/s her to 12/27/10.

## 2010-11-01 NOTE — Op Note (Signed)
NAMEWALAA, RIX                ACCOUNT NO.:  [id number]   MEDICAL RECORD NO.:  [id number]          PATIENT TYPE:  AMB   LOCATION:  DSC                          FACILITY:  [facility]   PHYSICIAN:  Alta Corning, M.D.   DATE OF BIRTH:  07-08-1940   DATE OF PROCEDURE:  06/05/2004  DATE OF DISCHARGE:                                 OPERATIVE REPORT   PREOPERATIVE DIAGNOSIS:  Medial meniscal tear.   POSTOPERATIVE DIAGNOSES:  1.  Medial meniscal tear.  2.  Lateral meniscal tear.  3.  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  4.  Chondromalacia of the patellofemoral compartment.   PRINCIPAL PROCEDURE:  1.  Partial medial and partial lateral meniscectomy with corresponding      debridement in the medial lateral compartment.  2.  Debridement of chondromalacia of patella.   SURGEON:  Alta Corning, M.D.   ASSISTANT:  Gary Fleet, PA.   ANESTHESIA:  General.   BRIEF HISTORY:  Ms. Clarey is a 70 year old female with a long history of  having significant for right knee pain.  She was evaluated in the office and  felt to have a medial meniscal tear.  She failed conservative care because  of continuing complaints of pain she was ultimately taken to the operating  room for arthroscopic evaluation of her right knee.   PROCEDURE:  The patient was taken to the operating room and general  anesthesia was obtained with endotracheal anesthesia.  The patient was  placed supine on the operating table.  The right leg was prepped and draped  in the usual sterile fashion.  Following this routine arthroscopic  examination of the knee revealed there was an obvious posterior horn medial  meniscal tear with a flap folded anteriorly.  The patellofemoral joint  showed some grade III and IV change over laterally.  There was also a  meniscal tear which was flapped anteriorly.  The medial compartment was  debrided back to smooth, stable rim and the medial femoral condyle was  debrided back to  smooth, stable rim.  There was still some articular flaking  around the corner even with about 100 degrees of flexion.  We really could  not get back to it but we did clean up all that we could get to.  On the  lateral side there was minimal chondromalacia with fairly significant  anterior horn lateral meniscal tear which was debrided.  Up in the  patellofemoral joint debridement was undertaken.  There was descent patella  tracking and there was no significant patellofemoral compression, no lateral  release was performed.   At this point the knee was copiously irrigated and suctioned dry.  Followup  check was made for any loose, fragment pieces to make sure the menisci were  stable both medial and lateral.  This was checked with a probe.  Once this  was accomplished the knee was copiously irrigated and suctioned dry and  closed with a bandage.  Sterile compressive dressing was applied.   The patient was taken to the recovery room and was noted to be in  satisfactory  condition.   ESTIMATED BLOOD LOSS:  None.      Juanita Craver  D:  06/05/2004  T:  06/06/2004  Job:  [id number]

## 2010-11-08 ENCOUNTER — Telehealth: Payer: Self-pay | Admitting: Cardiology

## 2010-11-08 NOTE — Telephone Encounter (Signed)
PT SAID SHE FORGOT TO TAKE HER HEART MEDS FOR AN ENTIRE DAY AND WANTS TO KNOW IF THIS WAS HARMFUL TO HER IN ANY WAY, CHART IN BOX.

## 2010-11-08 NOTE — Telephone Encounter (Signed)
Instructed pt to take her afternoon meds as instructed and not to double up since she missed the morning doses;  She verbalized and understanding of this

## 2010-11-12 ENCOUNTER — Other Ambulatory Visit (HOSPITAL_COMMUNITY): Payer: Medicare Other

## 2010-11-12 ENCOUNTER — Telehealth: Payer: Self-pay | Admitting: Internal Medicine

## 2010-11-12 ENCOUNTER — Other Ambulatory Visit (INDEPENDENT_AMBULATORY_CARE_PROVIDER_SITE_OTHER): Payer: Medicare Other | Admitting: *Deleted

## 2010-11-12 DIAGNOSIS — I428 Other cardiomyopathies: Secondary | ICD-10-CM

## 2010-11-12 DIAGNOSIS — I10 Essential (primary) hypertension: Secondary | ICD-10-CM

## 2010-11-12 LAB — BASIC METABOLIC PANEL
BUN: 34 mg/dL — ABNORMAL HIGH (ref 6–23)
CO2: 24 mEq/L (ref 19–32)
Chloride: 105 mEq/L (ref 96–112)
Potassium: 4.2 mEq/L (ref 3.5–5.1)

## 2010-11-12 NOTE — Telephone Encounter (Signed)
Copy of labs faxed to [place] [facility] at [phone].

## 2010-11-21 ENCOUNTER — Telehealth: Payer: Self-pay | Admitting: Internal Medicine

## 2010-11-21 NOTE — Telephone Encounter (Signed)
Pt rtn call to get test results

## 2010-11-21 NOTE — Telephone Encounter (Signed)
Lab results given to pt. She verbalized understanding.

## 2010-11-22 ENCOUNTER — Institutional Professional Consult (permissible substitution): Payer: Medicare Other | Admitting: Internal Medicine

## 2010-11-25 ENCOUNTER — Telehealth: Payer: Self-pay | Admitting: Cardiology

## 2010-11-25 NOTE — Telephone Encounter (Signed)
Pt requesting to receive a letter to be dismissed from [employer].  Dr. Doreatha Lew notified and instructed RN it was ok to excuse pt from [facility]  RN notified pt and pt will call back with the information needed for the letter.

## 2010-11-25 NOTE — Telephone Encounter (Signed)
Called in because she got called in for jury duty on July the 11th and she dosen't believe that she could sit for the entire duration of the trial. She takes medication that makes her use the restroom frequently and would like to receive a letter to be dismissed from jury duty. Please call back.

## 2010-11-26 ENCOUNTER — Encounter: Payer: Self-pay | Admitting: *Deleted

## 2010-11-26 ENCOUNTER — Telehealth: Payer: Self-pay | Admitting: *Deleted

## 2010-11-26 DIAGNOSIS — I428 Other cardiomyopathies: Secondary | ICD-10-CM

## 2010-11-26 NOTE — Telephone Encounter (Signed)
See most recent bmp.

## 2010-11-26 NOTE — Telephone Encounter (Signed)
RN mailed jury duty dismissal letter to pt per pt's request.

## 2010-12-04 ENCOUNTER — Telehealth: Payer: Self-pay | Admitting: Cardiology

## 2010-12-04 NOTE — Telephone Encounter (Signed)
Spoke with pt, pt is c/o pain with urination, frequency, and blood in urine.  RN advised pt to f/u with PCP ASAP.

## 2010-12-04 NOTE — Telephone Encounter (Signed)
Pt thinks she may have uti please call

## 2010-12-09 ENCOUNTER — Other Ambulatory Visit (INDEPENDENT_AMBULATORY_CARE_PROVIDER_SITE_OTHER): Payer: Medicare Other | Admitting: *Deleted

## 2010-12-09 ENCOUNTER — Telehealth: Payer: Self-pay | Admitting: Cardiology

## 2010-12-09 ENCOUNTER — Telehealth: Payer: Self-pay | Admitting: Internal Medicine

## 2010-12-09 DIAGNOSIS — I428 Other cardiomyopathies: Secondary | ICD-10-CM

## 2010-12-09 LAB — BASIC METABOLIC PANEL
CO2: 22 mEq/L (ref 19–32)
Calcium: 9.1 mg/dL (ref 8.4–10.5)
Creatinine, Ser: 0.9 mg/dL (ref 0.4–1.2)
GFR: 69.32 mL/min (ref >60.00)
Glucose, Bld: 113 mg/dL — ABNORMAL HIGH (ref 70–99)

## 2010-12-09 NOTE — Telephone Encounter (Signed)
Walk In pt Form " Pt dropped off Application for Handicapped Palcard" please completed and Mail when completed sent to Message Nurse 12/09/10/km

## 2010-12-09 NOTE — Telephone Encounter (Signed)
Dr. Doreatha Lew suggested pt follow up with Dr. Percival Spanish.  Pt would like to go ahead and have an appointment scheduled for Dr. Percival Spanish for mid August.  RN notified pt of appointment on 01/27/11 at 51 am at [facility].

## 2010-12-09 NOTE — Telephone Encounter (Signed)
PT CONFUSED AS TO WHO IS TAKING OVER HER CARE SINCE DR TENNANT HAS LEFT.

## 2010-12-12 ENCOUNTER — Telehealth: Payer: Self-pay | Admitting: *Deleted

## 2010-12-12 NOTE — Telephone Encounter (Signed)
[name] handicap placard renewal was mailed to her.

## 2010-12-16 ENCOUNTER — Ambulatory Visit: Payer: Medicare Other | Admitting: Internal Medicine

## 2010-12-23 ENCOUNTER — Ambulatory Visit: Payer: Medicare Other | Admitting: Nurse Practitioner

## 2010-12-24 ENCOUNTER — Ambulatory Visit: Payer: Medicare Other | Admitting: Internal Medicine

## 2010-12-27 ENCOUNTER — Ambulatory Visit (HOSPITAL_COMMUNITY): Payer: Medicare Other | Attending: Internal Medicine | Admitting: Radiology

## 2010-12-27 DIAGNOSIS — I079 Rheumatic tricuspid valve disease, unspecified: Secondary | ICD-10-CM | POA: Insufficient documentation

## 2010-12-27 DIAGNOSIS — I379 Nonrheumatic pulmonary valve disorder, unspecified: Secondary | ICD-10-CM | POA: Insufficient documentation

## 2010-12-27 DIAGNOSIS — I10 Essential (primary) hypertension: Secondary | ICD-10-CM | POA: Insufficient documentation

## 2010-12-27 DIAGNOSIS — E119 Type 2 diabetes mellitus without complications: Secondary | ICD-10-CM | POA: Insufficient documentation

## 2010-12-27 DIAGNOSIS — I428 Other cardiomyopathies: Secondary | ICD-10-CM | POA: Insufficient documentation

## 2010-12-27 DIAGNOSIS — I080 Rheumatic disorders of both mitral and aortic valves: Secondary | ICD-10-CM | POA: Insufficient documentation

## 2010-12-27 HISTORY — PX: TRANSTHORACIC ECHOCARDIOGRAM: SHX275

## 2011-01-03 ENCOUNTER — Ambulatory Visit: Payer: Medicare Other | Admitting: Internal Medicine

## 2011-01-23 ENCOUNTER — Other Ambulatory Visit: Payer: Self-pay | Admitting: Cardiology

## 2011-01-23 DIAGNOSIS — I119 Hypertensive heart disease without heart failure: Secondary | ICD-10-CM

## 2011-01-23 MED ORDER — LOSARTAN POTASSIUM 50 MG PO TABS: 50.0000 mg | ORAL_TABLET | Freq: Every day | ORAL | Status: DC

## 2011-01-23 NOTE — Telephone Encounter (Signed)
Wants RN to call [facility]. About her [place] Rx.  She said that the pharmacy called for a refill a couple of days ago and has not heard from [place] yet.

## 2011-01-23 NOTE — Telephone Encounter (Signed)
Left message on patients voice mail  Rx called to [facility]

## 2011-01-27 ENCOUNTER — Ambulatory Visit (INDEPENDENT_AMBULATORY_CARE_PROVIDER_SITE_OTHER): Payer: Medicare Other | Admitting: Cardiology

## 2011-01-27 ENCOUNTER — Encounter: Payer: Self-pay | Admitting: Cardiology

## 2011-01-27 VITALS — BP 120/68 | HR 60 | Ht 63.0 in | Wt 148.0 lb

## 2011-01-27 DIAGNOSIS — I428 Other cardiomyopathies: Secondary | ICD-10-CM

## 2011-01-27 DIAGNOSIS — F17200 Nicotine dependence, unspecified, uncomplicated: Secondary | ICD-10-CM

## 2011-01-27 DIAGNOSIS — I10 Essential (primary) hypertension: Secondary | ICD-10-CM

## 2011-01-27 DIAGNOSIS — Z720 Tobacco use: Secondary | ICD-10-CM

## 2011-01-27 NOTE — Assessment & Plan Note (Signed)
She seems to be euvolemic.  At this point, no change in therapy is indicated.  We have reviewed salt and fluid restrictions.  No further cardiovascular testing is indicated.   

## 2011-01-27 NOTE — Patient Instructions (Addendum)
Follow up in 6 months with Dr Percival Spanish.  You will receive a letter in the mail 2 months before you are due.  Please call us when you receive this letter to schedule your follow up appointment. The current medical regimen is effective;  continue present plan and medications.

## 2011-01-27 NOTE — Assessment & Plan Note (Signed)
The blood pressure is at target. No change in medications is indicated. We will continue with therapeutic lifestyle changes (TLC).  

## 2011-01-27 NOTE — Assessment & Plan Note (Signed)
We discussed this for greater than 3 minutes. She will try nicotine replacement first.  She will call me if she decides to take Chantix.

## 2011-01-27 NOTE — Progress Notes (Signed)
The patient presents for followup of a nonischemic cardiomyopathy. She was being considered for an ICD but her ejection fraction on echo last month was up to almost 50%.  She is continuing medical management and doing well with this.  The patient denies any new symptoms such as chest discomfort, neck or arm discomfort. There has been no new shortness of breath, PND or orthopnea. There have been no reported palpitations, presyncope or syncope.  I reviewed the echo results and reveiwed this with her today.    No Known Allergies  Current Outpatient Prescriptions  Medication Sig Dispense Refill  . ALPRAZolam (NIRAVAM) 0.25 MG dissolvable tablet Take 0.25 mg by mouth at bedtime as needed.       . carvedilol (COREG) 25 MG tablet Take 1 tablet (25 mg total) by mouth 2 (two) times daily.  60 tablet  11  . famotidine (PEPCID) 10 MG tablet Take 10 mg by mouth 2 (two) times daily.        . fish oil-omega-3 fatty acids 1000 MG capsule Take 2 g by mouth 2 (two) times daily.        . furosemide (LASIX) 40 MG tablet 40 mg 2 (two) times daily. Take one  IN THE AM, AND 1/2 AT NIGHT.      Marland Kitchen HYDROcodone-acetaminophen (VICODIN) 5-500 MG per tablet Take 1 tablet by mouth 3 (three) times daily.        Marland Kitchen levothyroxine (SYNTHROID, LEVOTHROID) 112 MCG tablet Take 112 mcg by mouth daily.       Marland Kitchen losartan (COZAAR) 50 MG tablet Take 1 tablet (50 mg total) by mouth daily.  30 tablet  1  . spironolactone (ALDACTONE) 25 MG tablet Take 1/2 tablet by mouth once daily  30 tablet  6    Past Medical History  Diagnosis Date  . Nonischemic cardiomyopathy     EF is 25% per echo February 2012, non ischemic myoview 12/2009  . HTN (hypertension)   . PVC (premature ventricular contraction)   . Diabetes mellitus   . Tobacco abuse   . Heart palpitations   . Valvular heart disease     Moderate to severe MR, moderate TR, LAE per echo 7/11  . Glaucoma   . Glaucoma     history of  . Spinal stenosis     Past Surgical History    Procedure Date  . Knee arthroscopy   . Tubal ligation   . Glaucoma surgery   . Abdominal hernia repair   . Right knee surgery   . Thyroidectomy     ROS:  Heartburn.  Otherwise as stated in the HPI and negative for all other systems.  PHYSICAL EXAM BP 120/68  Pulse 60  Ht 5\' 3"  (1.6 m)  Wt 148 lb (67.132 kg)  BMI 26.22 kg/m2 GENERAL:  Well appearing HEENT:  Pupils equal round and reactive, fundi not visualized, oral mucosa unremarkable NECK:  No jugular venous distention, waveform within normal limits, carotid upstroke brisk and symmetric, no bruits, no thyromegaly LYMPHATICS:  No cervical, inguinal adenopathy LUNGS:  Clear to auscultation bilaterally BACK:  No CVA tenderness CHEST:  Unremarkable HEART:  PMI not displaced or sustained,S1 and S2 within normal limits, no S3, no S4, no clicks, no rubs, no murmurs ABD:  Flat, positive bowel sounds normal in frequency in pitch, no bruits, no rebound, no guarding, no midline pulsatile mass, no hepatomegaly, no splenomegaly EXT:  2 plus pulses throughout, no edema, no cyanosis no clubbing SKIN:  No rashes no  nodules NEURO:  Cranial nerves II through XII grossly intact, motor grossly intact throughout PSYCH:  Cognitively intact, oriented to person place and time   EKG:  Sinus bradycardia, left deviation, left ventricular hypertrophy, and inferolateral T wave inversions  ASSESSMENT AND PLAN

## 2011-03-16 ENCOUNTER — Other Ambulatory Visit: Payer: Self-pay | Admitting: Cardiology

## 2011-03-18 LAB — CARDIAC PANEL(CRET KIN+CKTOT+MB+TROPI)
CK, MB: 3
CK, MB: 3.8
Relative Index: INVALID
Total CK: 60
Total CK: 66
Troponin I: 0.03

## 2011-03-18 LAB — BASIC METABOLIC PANEL
BUN: 30 — ABNORMAL HIGH
Calcium: 7.9 — ABNORMAL LOW
Calcium: 7.9 — ABNORMAL LOW
Calcium: 8.5
Creatinine, Ser: 0.87
GFR calc Af Amer: >60
GFR calc non Af Amer: 41 — ABNORMAL LOW
GFR calc non Af Amer: >60
GFR calc non Af Amer: >60
Glucose, Bld: 91
Potassium: 4.5
Sodium: 140
Sodium: 143

## 2011-03-18 LAB — COMPREHENSIVE METABOLIC PANEL
AST: 15
CO2: 22
Chloride: 102
Creatinine, Ser: 1.69 — ABNORMAL HIGH
GFR calc Af Amer: 37 — ABNORMAL LOW
GFR calc non Af Amer: 30 — ABNORMAL LOW
Total Bilirubin: 0.6

## 2011-03-18 LAB — GLUCOSE, CAPILLARY
Glucose-Capillary: 83
Glucose-Capillary: 89

## 2011-03-18 LAB — CBC
HCT: 34 — ABNORMAL LOW
HCT: 36
Hemoglobin: 11.4 — ABNORMAL LOW
Hemoglobin: 11.8 — ABNORMAL LOW
Platelets: 158
RBC: 3.81 — ABNORMAL LOW
WBC: 5.1
WBC: 7.7

## 2011-04-15 ENCOUNTER — Inpatient Hospital Stay (HOSPITAL_COMMUNITY): Payer: Medicare Other

## 2011-04-15 ENCOUNTER — Inpatient Hospital Stay (HOSPITAL_COMMUNITY)
Admission: EM | Admit: 2011-04-15 | Discharge: 2011-05-02 | DRG: 329 | Disposition: A | Discharge status: Home Health | Payer: Medicare Other | Attending: Surgery | Admitting: Surgery

## 2011-04-15 ENCOUNTER — Emergency Department (HOSPITAL_COMMUNITY): Payer: Medicare Other

## 2011-04-15 DIAGNOSIS — R109 Unspecified abdominal pain: Secondary | ICD-10-CM

## 2011-04-15 DIAGNOSIS — I429 Cardiomyopathy, unspecified: Secondary | ICD-10-CM | POA: Diagnosis present

## 2011-04-15 DIAGNOSIS — I079 Rheumatic tricuspid valve disease, unspecified: Secondary | ICD-10-CM | POA: Diagnosis present

## 2011-04-15 DIAGNOSIS — E871 Hypo-osmolality and hyponatremia: Secondary | ICD-10-CM | POA: Diagnosis present

## 2011-04-15 DIAGNOSIS — I071 Rheumatic tricuspid insufficiency: Secondary | ICD-10-CM | POA: Diagnosis present

## 2011-04-15 DIAGNOSIS — K9189 Other postprocedural complications and disorders of digestive system: Secondary | ICD-10-CM | POA: Diagnosis not present

## 2011-04-15 DIAGNOSIS — I129 Hypertensive chronic kidney disease with stage 1 through stage 4 chronic kidney disease, or unspecified chronic kidney disease: Secondary | ICD-10-CM | POA: Diagnosis present

## 2011-04-15 DIAGNOSIS — I428 Other cardiomyopathies: Secondary | ICD-10-CM

## 2011-04-15 DIAGNOSIS — E039 Hypothyroidism, unspecified: Secondary | ICD-10-CM | POA: Diagnosis present

## 2011-04-15 DIAGNOSIS — N179 Acute kidney failure, unspecified: Secondary | ICD-10-CM | POA: Diagnosis present

## 2011-04-15 DIAGNOSIS — M1990 Unspecified osteoarthritis, unspecified site: Secondary | ICD-10-CM | POA: Diagnosis present

## 2011-04-15 DIAGNOSIS — K567 Ileus, unspecified: Secondary | ICD-10-CM | POA: Diagnosis not present

## 2011-04-15 DIAGNOSIS — E876 Hypokalemia: Secondary | ICD-10-CM

## 2011-04-15 DIAGNOSIS — K560 Paralytic ileus: Secondary | ICD-10-CM | POA: Diagnosis not present

## 2011-04-15 DIAGNOSIS — I2589 Other forms of chronic ischemic heart disease: Secondary | ICD-10-CM | POA: Diagnosis present

## 2011-04-15 DIAGNOSIS — I059 Rheumatic mitral valve disease, unspecified: Secondary | ICD-10-CM | POA: Diagnosis present

## 2011-04-15 DIAGNOSIS — F17200 Nicotine dependence, unspecified, uncomplicated: Secondary | ICD-10-CM | POA: Diagnosis present

## 2011-04-15 DIAGNOSIS — E785 Hyperlipidemia, unspecified: Secondary | ICD-10-CM | POA: Diagnosis present

## 2011-04-15 DIAGNOSIS — N189 Chronic kidney disease, unspecified: Secondary | ICD-10-CM | POA: Diagnosis present

## 2011-04-15 DIAGNOSIS — K631 Perforation of intestine (nontraumatic): Secondary | ICD-10-CM | POA: Diagnosis present

## 2011-04-15 DIAGNOSIS — K56609 Unspecified intestinal obstruction, unspecified as to partial versus complete obstruction: Secondary | ICD-10-CM

## 2011-04-15 DIAGNOSIS — K219 Gastro-esophageal reflux disease without esophagitis: Secondary | ICD-10-CM | POA: Diagnosis present

## 2011-04-15 DIAGNOSIS — K565 Intestinal adhesions [bands], unspecified as to partial versus complete obstruction: Principal | ICD-10-CM | POA: Diagnosis present

## 2011-04-15 DIAGNOSIS — I340 Nonrheumatic mitral (valve) insufficiency: Secondary | ICD-10-CM

## 2011-04-15 LAB — URINE MICROSCOPIC-ADD ON

## 2011-04-15 LAB — CBC
Hemoglobin: 15.7 g/dL — ABNORMAL HIGH (ref 12.0–15.0)
MCH: 33.4 pg (ref 26.0–34.0)
Platelets: 265 10*3/uL (ref 150–400)
RBC: 4.7 MIL/uL (ref 3.87–5.11)
WBC: 9 10*3/uL (ref 4.0–10.5)

## 2011-04-15 LAB — URINALYSIS, ROUTINE W REFLEX MICROSCOPIC
Glucose, UA: NEGATIVE mg/dL
Specific Gravity, Urine: 1.024 (ref 1.005–1.030)
pH: 5 (ref 5.0–8.0)

## 2011-04-15 LAB — BASIC METABOLIC PANEL
CO2: 30 mEq/L (ref 19–32)
Calcium: 9.2 mg/dL (ref 8.4–10.5)
Chloride: <65 mEq/L — CL (ref 96–112)
Glucose, Bld: 114 mg/dL — ABNORMAL HIGH (ref 70–99)
Potassium: 4.6 mEq/L (ref 3.5–5.1)
Sodium: 123 mEq/L — ABNORMAL LOW (ref 135–145)

## 2011-04-15 LAB — DIFFERENTIAL
Basophils Absolute: 0 10*3/uL (ref 0.0–0.1)
Basophils Relative: 0 % (ref 0–1)
Lymphocytes Relative: 26 % (ref 12–46)
Monocytes Relative: 17 % — ABNORMAL HIGH (ref 3–12)
Neutro Abs: 5.2 10*3/uL (ref 1.7–7.7)
Neutrophils Relative %: 57 % (ref 43–77)

## 2011-04-16 ENCOUNTER — Inpatient Hospital Stay (HOSPITAL_COMMUNITY): Payer: Medicare Other

## 2011-04-16 LAB — BASIC METABOLIC PANEL WITH GFR
BUN: 93 mg/dL — ABNORMAL HIGH (ref 6–23)
CO2: 25 meq/L (ref 19–32)
Calcium: 7.4 mg/dL — ABNORMAL LOW (ref 8.4–10.5)
Chloride: 80 meq/L — ABNORMAL LOW (ref 96–112)
Creatinine, Ser: 5.09 mg/dL — ABNORMAL HIGH (ref 0.50–1.10)
GFR calc Af Amer: 9 mL/min — ABNORMAL LOW
GFR calc non Af Amer: 8 mL/min — ABNORMAL LOW
Glucose, Bld: 84 mg/dL (ref 70–99)
Potassium: 3.4 meq/L — ABNORMAL LOW (ref 3.5–5.1)
Sodium: 127 meq/L — ABNORMAL LOW (ref 135–145)

## 2011-04-16 LAB — CBC
MCV: 91.6 fL (ref 78.0–100.0)
Platelets: 180 10*3/uL (ref 150–400)
RBC: 3.92 MIL/uL (ref 3.87–5.11)
RDW: 12.6 % (ref 11.5–15.5)
WBC: 6.6 10*3/uL (ref 4.0–10.5)

## 2011-04-16 LAB — PRO B NATRIURETIC PEPTIDE: Pro B Natriuretic peptide (BNP): 1097 pg/mL — ABNORMAL HIGH (ref 0–125)

## 2011-04-16 LAB — OSMOLALITY: Osmolality: 286 mosm/kg (ref 275–300)

## 2011-04-16 LAB — MAGNESIUM: Magnesium: 1.9 mg/dL (ref 1.5–2.5)

## 2011-04-16 LAB — OSMOLALITY, URINE: Osmolality, Ur: 363 mosm/kg — ABNORMAL LOW (ref 390–1090)

## 2011-04-16 LAB — HEMOGLOBIN A1C
Hgb A1c MFr Bld: 5.7 % — ABNORMAL HIGH
Mean Plasma Glucose: 117 mg/dL — ABNORMAL HIGH

## 2011-04-16 LAB — TSH: TSH: 0.663 u[IU]/mL (ref 0.350–4.500)

## 2011-04-17 ENCOUNTER — Inpatient Hospital Stay (HOSPITAL_COMMUNITY): Payer: Medicare Other

## 2011-04-17 LAB — BASIC METABOLIC PANEL
BUN: 49 mg/dL — ABNORMAL HIGH (ref 6–23)
Calcium: 8.4 mg/dL (ref 8.4–10.5)
Calcium: 8.4 mg/dL (ref 8.4–10.5)
GFR calc Af Amer: 23 mL/min — ABNORMAL LOW (ref >90)
GFR calc Af Amer: 30 mL/min — ABNORMAL LOW (ref >90)
GFR calc non Af Amer: 20 mL/min — ABNORMAL LOW (ref >90)
GFR calc non Af Amer: 25 mL/min — ABNORMAL LOW (ref >90)
Glucose, Bld: 139 mg/dL — ABNORMAL HIGH (ref 70–99)
Glucose, Bld: 133 mg/dL — ABNORMAL HIGH (ref 70–99)
Potassium: 2.5 mEq/L — CL (ref 3.5–5.1)
Sodium: 136 mEq/L (ref 135–145)
Sodium: 135 mEq/L (ref 135–145)

## 2011-04-17 LAB — CBC
Hemoglobin: 12.2 g/dL (ref 12.0–15.0)
MCH: 32.4 pg (ref 26.0–34.0)
MCHC: 34.1 g/dL (ref 30.0–36.0)
RDW: 12.3 % (ref 11.5–15.5)

## 2011-04-18 ENCOUNTER — Other Ambulatory Visit (INDEPENDENT_AMBULATORY_CARE_PROVIDER_SITE_OTHER): Payer: Self-pay | Admitting: General Surgery

## 2011-04-18 DIAGNOSIS — K55059 Acute (reversible) ischemia of intestine, part and extent unspecified: Secondary | ICD-10-CM

## 2011-04-18 HISTORY — PX: OTHER SURGICAL HISTORY: SHX169

## 2011-04-18 LAB — GLUCOSE, CAPILLARY: Glucose-Capillary: 139 mg/dL — ABNORMAL HIGH (ref 70–99)

## 2011-04-18 LAB — BASIC METABOLIC PANEL
BUN: 38 mg/dL — ABNORMAL HIGH (ref 6–23)
Chloride: 90 mEq/L — ABNORMAL LOW (ref 96–112)
Creatinine, Ser: 1.53 mg/dL — ABNORMAL HIGH (ref 0.50–1.10)
Glucose, Bld: 146 mg/dL — ABNORMAL HIGH (ref 70–99)
Potassium: 3 mEq/L — ABNORMAL LOW (ref 3.5–5.1)

## 2011-04-18 NOTE — Consult Note (Signed)
Teresa [name], Teresa [name] NO.:  [id number]  MEDICAL RECORD NO.:  [id number]  LOCATION:  [id number]                         FACILITY:  [facility]  PHYSICIAN:  Thayer Headings, M.D. DATE OF BIRTH:  [birth year]  DATE OF CONSULTATION:  04/15/2011 DATE OF DISCHARGE:                                CONSULTATION   HISTORY OF PRESENT ILLNESS:  Teresa [name] is a 70 year old female with a history of nonischemic cardiomyopathy.  She is admitted with small bowel obstruction.  The patient has a history of nonischemic cardiomyopathy in the past. She has been on medical therapy and her ejection fraction has improved dramatically.  Her last EF by echo was 45% to 50%.  She denies any cardiac complaints.  Specifically, she denies any chest pain, shortness of breath, syncope, or presyncope.  She has been able to do all of her normal activities without any significant problems.  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  She does her housework and her yard work without any problems.  The patient has a history of small bowel obstruction in the past.  She started having some abdominal pain and some constipation yesterday. Today, she had intense belly pain and came to the [facility] Emergency Room.  CURRENT MEDICATIONS: 1. Alprazolam 0.25 mg at bedtime as needed. 2. Carvedilol 25 mg 2 times a day. 3. Pepcid 10 mg 2 times a day. 4. Omega-3 fatty acids 2 g twice a day. 5. Lasix 40 mg in the morning and 20 mg at night. 6. Hydrocodone 1 tablet 3 times a day. 7. Synthroid 112 mcg a day. 8. Cozaar 50 mg a day. 9. Spironolactone 12.5 mg a day.  ALLERGIES:  She has no known drug allergies.  PAST MEDICAL HISTORY: 1. History of congestive heart failure.  Her ejection fraction was 25%     by echo in 2012.  She has now been on good medical therapy and her     last EF is 45% to 50%.  She had a nonischemic Myoview in July 2011. 2. Hypertension. 3. Premature ventricular contractions. 4.  Diabetes mellitus. 5. History of cigarette smoking. 6. History of moderate-to-severe mitral regurgitation with moderate     tricuspid regurgitation.  FAMILY HISTORY:  Negative for premature cardiac disease.  REVIEW OF SYSTEMS:  Reviewed in the HPI.  All other systems are negative.  PHYSICAL EXAMINATION:  GENERAL:  She is an elderly female in no acute distress.  She is feeling a lot better now that she has an NG placed. VITAL SIGNS:  Her blood pressure is 95/52, heart rate is 95. NECK:  2+ carotids.  She has no bruits.  No JVD.  There is no thyromegaly.  Supple. LUNG:  Clear.  Her back is nontender. HEART:  Regular rate.  S1, S2.  She has no murmurs.  Her PMI is nondisplaced. ABDOMEN:  She is somewhat tender.  Her abdomen is distended.  She has an NG in place. EXTREMITIES:  She has no clubbing, cyanosis, or edema.  There are no palpable cords. NEURO:  Nonfocal.  Cranial nerves II through XII are intact.  Her motor and sensory function intact.  LABORATORY DATA:  Her white  blood cell count is 9.0, hemoglobin is 15.5, hematocrit is 43%.  Remaining labs are pending.  IMAGING STUDIES:  Her chest x-ray is not performed yet.  IMPRESSION/PLAN:  History of cardiomyopathy.  The patient is very well compensated.  Her ejection fraction has increased from 25% up to around 50% on medical therapy.  I do not think that she will have any immediate complications.  Currently, she is NPO because her medicines need to be held.  At this point, I do not think that she needs any of her medications as her blood pressure is a little low.  Specifically, she does not need furosemide as long as she is not taking in fluids or food.  I would slowly add back her heart failure medications once she is taking p.o.  If she becomes tachycardic, we can use IV metoprolol.  I do not think that she will have any significant worsening of her congestive heart failure.  There was a question about the use of IV  fluids.  Certainly, she can receive the IV fluids, deemed appropriate by the hospitalist team.  Her creatinine has been normal as recently as December 09, 2010.  I doubt that she will need to go to Surgery.  If she does need surgery, I think that she is at low risk for any cardiovascular complications since her ejection fraction is fairly well preserved.  We will sign off at this time.  Please call us if there are any other questions.     Thayer Headings, M.D.     PJN/MEDQ  D:  04/15/2011  T:  04/16/2011  Job:  [id number]  cc:   Minus Breeding, MD, [place]. [place] [address]  Electronically Signed by Mertie Moores M.D. on 04/18/2011 03:20:30 PM

## 2011-04-18 NOTE — Consult Note (Signed)
NAMEJAHNICE, PRINE NO.:  [id number]  MEDICAL RECORD NO.:  [id number]  LOCATION:  WLED                         FACILITY:  [facility]  PHYSICIAN:  Reyne Dumas, MD       DATE OF BIRTH:  1940/07/16  DATE OF CONSULTATION:  04/15/2011 DATE OF DISCHARGE:                                CONSULTATION   PRIMARY CARE PHYSICIAN:  Wendie Agreste, M.D. at [facility].  PRIMARY CARDIOLOGIST:  Minus Breeding, MD, [facility]  CHIEF COMPLAINT:  Nausea and vomiting.  SUBJECTIVE:  This is a 70 year old female, who presents to the ER with a chief complaint of nausea and vomiting for the last 4 days.  The symptoms started last Saturday, and she has been unable to eat and keep any fluids down.  The patient has been having bilious vomiting associated with some abdominal pain.  She does have a history of small bowel obstruction and multiple abdominal surgeries in the past.  She has not had a BM since yesterday.  She is not passing any flatus.  The patient was admitted by the Surgical Service for her small bowel obstruction.  Currently, she has an NG tube in place and appears to be comfortable with no recurrent nausea, vomiting.  In the ER, she was found to be mildly hypertensive and in acute renal failure.  Therefore, medical consultation was requested.  The patient is unaware of her baseline creatinine.  She states that she has been holding her Lasix over the last 4 to 5 days because of the nausea, vomiting, and inability to keep any fluids down.  PAST MEDICAL HISTORY:  History of nonischemic cardiomyopathy, hypertension, premature ventricular contractions, diabetes mellitus, tobacco abuse, valvular heart disease with moderate-to-severe mitral regurgitation, moderate tricuspid regurgitation, glaucoma, history of spinal stenosis.  PAST SURGICAL HISTORY:  Knee arthroscopy, tubal ligation, glaucoma, surgery of abdominal hernia repair, right knee surgery, thyroidectomy.  REVIEW  OF SYSTEMS:  Fourteen point review of systems was done and documented in HPI.  ALLERGIES:  No known drug allergies.  CURRENT HOME MEDICATIONS:  Fish oil, Clear Eyes, Levoxyl, Lasix, losartan, Coreg, Aldactone, alprazolam, hydrocodone, Tums, Gas-X, and Zofran.  SOCIAL HISTORY:  The patient lives home alone and her husband is in the nursing home.  She smokes half a pack a day, and has been a long- standing smoker.  FAMILY HISTORY:  The patient states that diabetes, stroke, MI and cancer, all run in the family.  She has 7 siblings, and she has a daughter with cancer.  She describes __________ tumor between the heart and the lungs.  PHYSICAL EXAMINATION:  VITAL SIGNS:  Blood pressure 95/52, heart rate 75. GENERAL:  Comfortable currently.  No acute cardiopulmonary distress. HEENT:  NG tube in place. NECK:  Supple.  No JVD. LUNGS:  Clear to auscultation bilaterally.  No wheezes.  No crackles. No rhonchi. CARDIOVASCULAR:  Regular rate and rhythm.  No murmurs, rubs, or gallops. ABDOMEN:  Soft, hyperactive bowel sounds. NEUROLOGIC:  Cranial nerves II-XII grossly intact. PSYCHIATRIC:  Appropriate mood and affect.  LABORATORY DATA:  Abdominal films done in [facility] confirmed that the patient has small-bowel obstruction.  Sodium 123, potassium 4.6,  chloride less than 65, bicarbonate 30, glucose 114, BUN 90, creatinine 6.42, calcium 9.2.  Urinalysis shows moderate amount of bilirubin, small amount of leukocytes, 3 to 6 rbc's per high-power field.  Cardiac enzymes are negative. CBC:  WBC 9.0, hemoglobin 15.7, hematocrit 43.0, and a platelet count of 265.  ASSESSMENT AND PLAN: 1. History of nonischemic cardiomyopathy, evaluated by Cardiology in     the ED.  Okay to go for surgery if surgery is indicated from a     cardiac standpoint. 2. Hypertension.  Currently hypotensive because of small bowel     obstruction and dehydration. 3. History of premature ventricular contraction  without any evidence     of significant electrolyte abnormalities. 4. Hyponatremia. 5. Acute on chronic renal insufficiency with a baseline creatinine     that is unknown.  PLAN:  The patient will be admitted to Telemetry.  We will discontinue her losartan and her Lasix and her Aldactone in the setting of her acute renal failure.  We will continue on her Coreg with hold parameters.  The patient will be closely monitored for her volume status.  We will avoid any NSAIDs or ACE inhibitors because of her acute renal failure.  Renal ultrasound will be obtained because of her hyponatremia, which I feel is due to dehydration.  The patient will have a urine osmolality, urine creatinine, and a urine sodium.  The most likely the patient has acute renal failure secondary to prerenal etiology in the setting of the use of diuretics as well as ARBs.  The patient does not have any gross electrolyte abnormalities to require an urgent nephrology consultation. If the patient's creatinine does not improve over the course of the next 1 to 2 days, then a Nephrology consultation will be considered.  We thank [facility] for this consultation.  We will continue to follow the patient.     Reyne Dumas, MD     NA/MEDQ  D:  04/15/2011  T:  04/15/2011  Job:  [id number]  Electronically Signed by Reyne Dumas MD on 04/18/2011 10:45:22 AM

## 2011-04-19 LAB — BASIC METABOLIC PANEL
CO2: 24 mEq/L (ref 19–32)
Calcium: 7 mg/dL — ABNORMAL LOW (ref 8.4–10.5)
Chloride: 108 mEq/L (ref 96–112)
Potassium: 4.3 mEq/L (ref 3.5–5.1)
Sodium: 140 mEq/L (ref 135–145)

## 2011-04-19 LAB — URINALYSIS, MICROSCOPIC ONLY
Bilirubin Urine: NEGATIVE
Ketones, ur: NEGATIVE mg/dL
Nitrite: NEGATIVE
Urobilinogen, UA: 0.2 mg/dL (ref 0.0–1.0)

## 2011-04-19 LAB — PHOSPHORUS: Phosphorus: 1.9 mg/dL — ABNORMAL LOW (ref 2.3–4.6)

## 2011-04-19 LAB — CBC
Platelets: 188 10*3/uL (ref 150–400)
RBC: 3.96 MIL/uL (ref 3.87–5.11)
WBC: 21.5 10*3/uL — ABNORMAL HIGH (ref 4.0–10.5)

## 2011-04-19 LAB — MAGNESIUM: Magnesium: 1.6 mg/dL (ref 1.5–2.5)

## 2011-04-19 MED ORDER — FAMOTIDINE IN NACL 20-0.9 MG/50ML-% IV SOLN
20.0000 mg | Freq: Two times a day (BID) | INTRAVENOUS | Status: DC
  Administered 2011-04-20 – 2011-04-24 (×10): 20 mg via INTRAVENOUS
  Filled 2011-04-19 (×15): qty 50

## 2011-04-19 MED ORDER — DIPHENHYDRAMINE HCL 12.5 MG/5ML PO ELIX: 12.5000 mg | ORAL_SOLUTION | Freq: Four times a day (QID) | ORAL | Status: DC | PRN

## 2011-04-19 MED ORDER — CHLORHEXIDINE GLUCONATE 0.12 % MT SOLN
15.0000 mL | Freq: Two times a day (BID) | OROMUCOSAL | Status: DC
  Administered 2011-04-20 – 2011-05-01 (×11): 15 mL via OROMUCOSAL
  Filled 2011-04-19 (×29): qty 15

## 2011-04-19 MED ORDER — KCL IN DEXTROSE-NACL 10-5-0.45 MEQ/L-%-% IV SOLN
INTRAVENOUS | Status: DC
  Administered 2011-04-20 – 2011-04-23 (×4): via INTRAVENOUS
  Administered 2011-04-23: 100 mL via INTRAVENOUS
  Administered 2011-04-24: 20 mL/h via INTRAVENOUS
  Filled 2011-04-19 (×15): qty 1000

## 2011-04-19 MED ORDER — ONDANSETRON HCL 4 MG/2ML IJ SOLN: 4.0000 mg | Freq: Four times a day (QID) | INTRAMUSCULAR | Status: DC | PRN

## 2011-04-19 MED ORDER — PROMETHAZINE HCL 25 MG/ML IJ SOLN: 6.2500 mg | Freq: Four times a day (QID) | INTRAMUSCULAR | Status: DC | PRN

## 2011-04-19 MED ORDER — KCL IN DEXTROSE-NACL 30-5-0.45 MEQ/L-%-% IV SOLN: INTRAVENOUS | Status: DC

## 2011-04-19 MED ORDER — DIPHENHYDRAMINE HCL 50 MG/ML IJ SOLN: 12.5000 mg | Freq: Every evening | INTRAMUSCULAR | Status: DC | PRN

## 2011-04-19 MED ORDER — DIPHENHYDRAMINE HCL 50 MG/ML IJ SOLN: 12.5000 mg | Freq: Four times a day (QID) | INTRAMUSCULAR | Status: DC | PRN

## 2011-04-19 MED ORDER — ONDANSETRON HCL 4 MG/2ML IJ SOLN: 4.0000 mg | INTRAMUSCULAR | Status: DC | PRN

## 2011-04-19 MED ORDER — KCL IN DEXTROSE-NACL 20-5-0.45 MEQ/L-%-% IV SOLN: INTRAVENOUS | Status: DC

## 2011-04-19 MED ORDER — SODIUM CHLORIDE 0.9 % IJ SOLN: 9.0000 mL | INTRAMUSCULAR | Status: DC | PRN

## 2011-04-19 MED ORDER — BIOTENE DRY MOUTH MT LIQD: 15.0000 mL | OROMUCOSAL | Status: DC

## 2011-04-19 MED ORDER — PROSIGHT PO TABS
1.0000 | ORAL_TABLET | Freq: Three times a day (TID) | ORAL | Status: DC | PRN
  Filled 2011-04-19: qty 1

## 2011-04-19 MED ORDER — NALOXONE HCL 0.4 MG/ML IJ SOLN: 0.4000 mg | INTRAMUSCULAR | Status: DC | PRN

## 2011-04-19 MED ORDER — DEXTROSE-NACL 5-0.45 % IV SOLN: INTRAVENOUS | Status: DC

## 2011-04-19 MED ORDER — CARVEDILOL 6.25 MG PO TABS
6.2500 mg | ORAL_TABLET | Freq: Two times a day (BID) | ORAL | Status: DC
  Filled 2011-04-19 (×4): qty 1

## 2011-04-19 MED ORDER — HYDROMORPHONE 0.3 MG/ML IV SOLN
INTRAVENOUS | Status: DC
  Administered 2011-04-20: 0.4 mg via INTRAVENOUS
  Administered 2011-04-20: 0.6 mg via INTRAVENOUS
  Administered 2011-04-20 (×3): 0.2 mg via INTRAVENOUS
  Administered 2011-04-20: 0.6 mg via INTRAVENOUS
  Administered 2011-04-21: 0.399 mg via INTRAVENOUS
  Administered 2011-04-22: 0.4 mg via INTRAVENOUS
  Administered 2011-04-22: 12:00:00 via INTRAVENOUS
  Administered 2011-04-22 – 2011-04-23 (×3): 0.2 mg via INTRAVENOUS
  Administered 2011-04-23: 0.199 mg via INTRAVENOUS
  Administered 2011-04-23: 0.2 mg via INTRAVENOUS
  Administered 2011-04-24: 0.6 mg via INTRAVENOUS
  Administered 2011-04-24: 1 mg via INTRAVENOUS
  Administered 2011-04-24: 1.99 mg via INTRAVENOUS
  Administered 2011-04-24: 0.6 mg via INTRAVENOUS
  Administered 2011-04-25: 4 mg via INTRAVENOUS
  Administered 2011-04-25: 0.2 mg via INTRAVENOUS
  Filled 2011-04-19 (×2): qty 25

## 2011-04-19 MED ORDER — HEPARIN SODIUM (PORCINE) 5000 UNIT/ML IJ SOLN
5000.0000 [IU] | Freq: Three times a day (TID) | INTRAMUSCULAR | Status: DC
  Administered 2011-04-20 – 2011-05-02 (×37): 5000 [IU] via SUBCUTANEOUS
  Filled 2011-04-19 (×43): qty 1

## 2011-04-19 MED ORDER — LORAZEPAM 2 MG/ML IJ SOLN
0.5000 mg | Freq: Four times a day (QID) | INTRAMUSCULAR | Status: DC | PRN
  Administered 2011-04-20 – 2011-04-23 (×3): 0.5 mg via INTRAVENOUS
  Filled 2011-04-19: qty 1

## 2011-04-20 LAB — CBC
HCT: 30 % — ABNORMAL LOW (ref 36.0–46.0)
Hemoglobin: 10.5 g/dL — ABNORMAL LOW (ref 12.0–15.0)
MCH: 33.8 pg (ref 26.0–34.0)
MCHC: 35 g/dL (ref 30.0–36.0)
RBC: 3.11 MIL/uL — ABNORMAL LOW (ref 3.87–5.11)

## 2011-04-20 LAB — URINE CULTURE
Colony Count: NO GROWTH
Culture  Setup Time: 201211031354
Culture: NO GROWTH
Special Requests: NEGATIVE

## 2011-04-20 LAB — BASIC METABOLIC PANEL
BUN: 19 mg/dL (ref 6–23)
CO2: 24 mEq/L (ref 19–32)
GFR calc non Af Amer: 58 mL/min — ABNORMAL LOW (ref >90)
Glucose, Bld: 105 mg/dL — ABNORMAL HIGH (ref 70–99)
Potassium: 3.6 mEq/L (ref 3.5–5.1)

## 2011-04-20 MED ORDER — POTASSIUM PHOSPHATE DIBASIC 3 MMOLE/ML IV SOLN
15.0000 mmol | Freq: Once | INTRAVENOUS | Status: AC
  Administered 2011-04-20: 15 mmol via INTRAVENOUS
  Filled 2011-04-20: qty 5

## 2011-04-20 MED ORDER — LORAZEPAM 2 MG/ML IJ SOLN
INTRAMUSCULAR | Status: AC
  Administered 2011-04-20: 0.5 mg via INTRAVENOUS
  Filled 2011-04-20: qty 1

## 2011-04-20 NOTE — Progress Notes (Signed)
  Subjective: Alert and reasonably comfortable. Getting out of bed to chair. Denies nausea but nasogastric drainage is high. No stool or flatus. No shortness of breath.  Objective: Vital signs in last 24 hours: Temp:  [98.3 F (36.8 C)-100.3 F (37.9 C)] 98.5 F (36.9 C) (11/04 0400) Pulse Rate:  [98] 98  (11/03 1600) Resp:  [20-24] 20  (11/04 0400) BP: (122)/(47) 122/47 mmHg (11/03 1600) SpO2:  [96 %-99 %] 99 % (11/04 0400) Weight:  [156 lb 12 oz (71.1 kg)] 156 lb 12 oz (71.1 kg) (11/04 0300)    Intake/Output from previous day: 11/03 0701 - 11/04 0700 In: 780 [I.V.:750; NG/GT:30] Out: 2520 [Urine:1420; Emesis/NG output:1100] Intake/Output this shift:    Resp: clear to auscultation bilaterally GI: abdomen is soft and a little bit tender. Midline wound is packed open. There are no bowel sounds.NG drainage is bilious.  Lab Results:   [facility] 04/20/11 0414 04/19/11 0251  WBC 15.1* 21.5*  HGB 10.5* 13.0  HCT 30.0* 39.1  PLT 128* 188   BMET  Basename 04/20/11 0414 04/19/11 0251  NA 138 140  K 3.6 4.3  CL 107 108  CO2 24 24  GLUCOSE 105* 96  BUN 19 31*  CREATININE 0.97 1.19*  CALCIUM 7.7* 7.0*   PT/INR No results found for this basename: LABPROT:2,INR:2 in the last 72 hours ABG No results found for this basename: PHART:2,PCO2:2,PO2:2,HCO3:2 in the last 72 hours  Studies/Results: No results found.  Anti-infectives: Anti-infectives    None      Assessment/Plan: s/p laparotomy, lysis of adhesions, small bowel resection x2, left lower quadrant hernia repair.  transfer to the floor today.Discontinue Foley catheter.  Wound care twice a day ordered.  Begin ambulation as tolerated.  Check CBC and B-met tomorrow.  LOS: 5 days    Amaris Garrette M 04/20/2011

## 2011-04-20 NOTE — Consult Note (Signed)
Subjective: Pt doing well today, she denies chest pain, no sob. Objective: Vital signs in last 24 hours: Filed Vitals:   04/20/11 0000 04/20/11 0300 04/20/11 0400 04/20/11 0800  BP:    102/40  Pulse:    93  Temp: 100.3 F (37.9 C)  98.5 F (36.9 C) 99.5 F (37.5 C)  TempSrc:    Oral  Resp: 22  20 22   Height:  5\' 4"  (1.626 m)    Weight:  71.1 kg (156 lb 12 oz)    SpO2: 99%  99% 99%   Weight change:   Intake/Output Summary (Last 24 hours) at 04/20/11 1105 Last data filed at 04/20/11 0600  Gross per 24 hour  Intake    780 ml  Output   2520 ml  Net  -1740 ml       Lungs:     Clear to auscultation bilaterally, respirations unlabored   Heart:    Regular rate and rhythm, S1 and S2 normal, no murmur, rub   or gallop  Abdomen:     Soft,decreased bowel sounds,    no masses, no organomegaly  Extremities:   Extremities normal, atraumatic, no cyanosis or edema  Neurologic:   CNII-XII intact, normal strength,      Lab Results: Results for orders placed during the hospital encounter of 04/15/11 (from the past 24 hour(s))  CBC     Status: Abnormal   Collection Time   04/20/11  4:14 AM      Component Value Range   WBC 15.1 (*) 4.0 - 10.5 (K/uL)   RBC 3.11 (*) 3.87 - 5.11 (MIL/uL)   Hemoglobin 10.5 (*) 12.0 - 15.0 (g/dL)   HCT 30.0 (*) 36.0 - 46.0 (%)   MCV 96.5  78.0 - 100.0 (fL)   MCH 33.8  26.0 - 34.0 (pg)   MCHC 35.0  30.0 - 36.0 (g/dL)   RDW 12.9  11.5 - 15.5 (%)   Platelets 128 (*) 150 - 400 (K/uL)  BASIC METABOLIC PANEL     Status: Abnormal   Collection Time   04/20/11  4:14 AM      Component Value Range   Sodium 138  135 - 145 (mEq/L)   Potassium 3.6  3.5 - 5.1 (mEq/L)   Chloride 107  96 - 112 (mEq/L)   CO2 24  19 - 32 (mEq/L)   Glucose, Bld 105 (*) 70 - 99 (mg/dL)   BUN 19  6 - 23 (mg/dL)   Creatinine, Ser 0.97  0.50 - 1.10 (mg/dL)   Calcium 7.7 (*) 8.4 - 10.5 (mg/dL)   GFR calc non Af Amer 58 (*) >90 (mL/min)   GFR calc Af Amer 67 (*) >90 (mL/min)  PHOSPHORUS      Status: Abnormal   Collection Time   04/20/11  4:14 AM      Component Value Range   Phosphorus 1.3 (*) 2.3 - 4.6 (mg/dL)    Studies/Results:  Scheduled Meds:   . chlorhexidine  15 mL Mouth/Throat BID  . famotidine (PEPCID) IV  20 mg Intravenous Q12H  . heparin subcutaneous  5,000 Units Subcutaneous Q8H  . HYDROmorphone PCA 0.3 mg/mL   Intravenous Q4H  . DISCONTD: antiseptic oral rinse  15 mL Mouth Rinse Custom  . DISCONTD: carvedilol  6.25 mg Oral BID WC   Continuous Infusions:   . dextrose 5 % and 0.45 % NaCl with KCl 10 mEq/L 125 mL/hr at 04/20/11 1015  . DISCONTD: dextrose 5 % and 0.45 % NaCl with  KCl 20 mEq/L    . DISCONTD: dextrose 5 % and 0.45% NaCl     PRN Meds:.diphenhydrAMINE, diphenhydrAMINE, diphenhydrAMINE, LORazepam, naloxone, ondansetron (ZOFRAN) IV, ondansetron (ZOFRAN) IV, promethazine, sodium chloride, DISCONTD: multivitamin Assessment/Plan: 1. ARF - resolved 2.Hyponatremia - resolved 4.SBO with ischemic enterotomy - per primary team. 3.Leukocytosis -  Likely ssecondary to #2 resolving 5.Hypophosphatemia - replete   [name] acute medical problems resolved at this time - will s/o, please call as needed.  LOS: 5 days   Teresa [name] C 04/20/2011, 11:05 AM

## 2011-04-20 NOTE — Plan of Care (Signed)
Problem: Phase II Progression Outcomes Goal: Progress activity as tolerated unless otherwise ordered Up to chair 3 hours. Ambulated short distance in hall way with assistance.  Tolerated well.

## 2011-04-20 NOTE — Progress Notes (Signed)
  Subjective:  Alert and reasonably comfortable. Getting out of bed to chair. Denies nausea but nasogastric drainage is high. No stool or flatus. No shortness of breath.  Objective:  Vital signs in last 24 hours:  Temp: [98.3 F (36.8 C)-100.3 F (37.9 C)] 98.5 F (36.9 C) (11/04 0400)  Pulse Rate: [98] 98 (11/03 1600)  Resp: [20-24] 20 (11/04 0400)  BP: (122)/(47) 122/47 mmHg (11/03 1600)  SpO2: [96 %-99 %] 99 % (11/04 0400)  Weight: [156 lb 12 oz (71.1 kg)] 156 lb 12 oz (71.1 kg) (11/04 0300)   Intake/Output from previous day:  11/03 0701 - 11/04 0700  In: 780 [I.V.:750; NG/GT:30]  Out: 2520 [Urine:1420; Emesis/NG output:1100]  Intake/Output this shift:   Resp: clear to auscultation bilaterally  GI: abdomen is soft and a little bit tender. Midline wound is packed open. There are no bowel sounds.NG drainage is bilious.  Lab Results:   [facility]  04/20/11 0414  04/19/11 0251   WBC  15.1*  21.5*   HGB  10.5*  13.0   HCT  30.0*  39.1   PLT  128*  188    BMET   Basename  04/20/11 0414  04/19/11 0251   NA  138  140   K  3.6  4.3   CL  107  108   CO2  24  24   GLUCOSE  105*  96   BUN  19  31*   CREATININE  0.97  1.19*   CALCIUM  7.7*  7.0*    PT/INR  No results found for this basename: LABPROT:2,INR:2 in the last 72 hours  ABG  No results found for this basename: PHART:2,PCO2:2,PO2:2,HCO3:2 in the last 72 hours  Studies/Results:  No results found.  Anti-infectives:  Anti-infectives    None      Assessment/Plan:  s/p laparotomy, lysis of adhesions, small bowel resection x2, left lower quadrant hernia repair.  transfer to the floor today.Discontinue Foley catheter.  Wound care twice a day ordered.  Begin ambulation as tolerated.  Check CBC and B-met tomorrow.  LOS: 5 days  Tristyn Pharris M  04/20/2011

## 2011-04-21 ENCOUNTER — Encounter (HOSPITAL_COMMUNITY): Payer: Self-pay | Admitting: General Practice

## 2011-04-21 LAB — URINE CULTURE
Colony Count: NO GROWTH
Culture: NO GROWTH

## 2011-04-21 LAB — BASIC METABOLIC PANEL
Chloride: 105 mEq/L (ref 96–112)
GFR calc Af Amer: 73 mL/min — ABNORMAL LOW (ref >90)
Potassium: 3.6 mEq/L (ref 3.5–5.1)

## 2011-04-21 LAB — PHOSPHORUS: Phosphorus: 1.9 mg/dL — ABNORMAL LOW (ref 2.3–4.6)

## 2011-04-21 MED ORDER — POTASSIUM PHOSPHATE DIBASIC 3 MMOLE/ML IV SOLN
15.0000 mmol | Freq: Once | INTRAVENOUS | Status: AC
  Administered 2011-04-21: 15 mmol via INTRAVENOUS
  Filled 2011-04-21: qty 5

## 2011-04-21 NOTE — Progress Notes (Signed)
Patient arrived from icu . Alert and oriented. Able to ambulate short distance. Vital signs stable. Afebrile. Oriented patient to room, call bell placed in reach. Intermittent suction placed to ng tube. Green drainage. C/o pain, reoriented to pca pump, patient pressed button herself. Ice chips given. Pulse ox applied sats 97% on room air.

## 2011-04-21 NOTE — Progress Notes (Signed)
04/21/11 1540 Pt transferred to 1535. Pt alert at bedside. Pt introduced to [employer]. Pt belonging placed at bedside. Pt oriented to new room and shown belongings.  Wyn Quaker RN

## 2011-04-21 NOTE — Progress Notes (Signed)
MEDICATION RELATED CONSULT NOTE - FOLLOW UP   Pharmacy Consult for phosphorus replacement Indication: hypophosphatemia  No Known Allergies  Patient Measurements: Height: 5\' 4"  (162.6 cm) Weight: 161 lb 2.5 oz (73.1 kg) IBW/kg (Calculated) : 54.7  Adjusted Body Weight:   Vital Signs:  Intake/Output from previous day: 11/04 0701 - 11/05 0700 In: 3290.9 [P.O.:223; I.V.:2937.9; NG/GT:30; IV Piggyback:100] Out: 2780 [Urine:1130; Emesis/NG output:1650] Intake/Output from this shift: Total I/O In: 250 [I.V.:250] Out: 120 [Urine:120]  Labs:  Basename 04/21/11 0315 04/20/11 0414 04/19/11 0251  WBC -- 15.1* 21.5*  HGB -- 10.5* 13.0  HCT -- 30.0* 39.1  PLT -- 128* 188  APTT -- -- --  CREATININE 0.90 0.97 1.19*  LABCREA -- -- --  CREATININE 0.90 0.97 1.19*  CREAT24HRUR -- -- --  MG -- -- 1.6  PHOS 1.9* 1.3* 1.9*  ALBUMIN -- -- --  PROT -- -- --  ALBUMIN -- -- --  AST -- -- --  ALT -- -- --  ALKPHOS -- -- --  BILITOT -- -- --  BILIDIR -- -- --  IBILI -- -- --   Estimated Creatinine Clearance: 57 ml/min (by C-G formula based on Cr of 0.9).   Microbiolo   Medications:  Infustions:    . dextrose 5 % and 0.45 % NaCl with KCl 10 mEq/L 125 mL/hr at 04/20/11 0400    Assessment: Phosphorus remains low s/p potassium phosphate 66mmol IV bolus 11/4.  Repeat same today & f/u am labs.  Goal of Therapy: normal range 2.3-4.6 mg/dL  Plan:  Potassium phosphate 66mmol IV today over 4hrs; f/u phosphorus in am.  Peggyann Juba R 04/21/2011,10:10 AM

## 2011-04-21 NOTE — Progress Notes (Signed)
  Subjective: Nose is sore.  No BM or flatus.  Objective: Vital signs in last 24 hours: Temp:  [97.7 F (36.5 C)-99.9 F (37.7 C)] 99 F (37.2 C) (11/05 0000) Pulse Rate:  [61-93] 64  (11/05 0300) Resp:  [21-28] 26  (11/05 0400) BP: (86-133)/(38-66) 86/38 mmHg (11/05 0400) SpO2:  [96 %-100 %] 98 % (11/05 0400) FiO2 (%):  [96 %-98 %] 98 % (11/05 0400) Weight:  [161 lb 2.5 oz (73.1 kg)] 161 lb 2.5 oz (73.1 kg) (11/05 0000)    BP = 116/42 currently. Intake/Output from previous day: 11/04 0701 - 11/05 0700 In: 3165.9 [P.O.:223; I.V.:2812.9; NG/GT:30; IV Piggyback:100] Out: 2780 [Urine:1130; Emesis/NG output:1650] Intake/Output this shift:    BM=0 Abd:  Open wound is clean with intact fascia; rare BS  Lab Results:   Basename 04/20/11 0414 04/19/11 0251  WBC 15.1* 21.5*  HGB 10.5* 13.0  HCT 30.0* 39.1  PLT 128* 188   BMET  Basename 04/21/11 0315 04/20/11 0414  NA 136 138  K 3.6 3.6  CL 105 107  CO2 23 24  GLUCOSE 94 105*  BUN 12 19  CREATININE 0.90 0.97  CALCIUM 7.6* 7.7*   PT/INR No results found for this basename: LABPROT:2,INR:2 in the last 72 hours ABG No results found for this basename: PHART:2,PCO2:2,PO2:2,HCO3:2 in the last 72 hours  Studies/Results: @RISRSLT24 @  Anti-infectives: Anti-infectives    None      Assessment 1.  s/p expl lap with loa and sb resection X 2-wound ok; has ileus as expected.  2.  ARF- resolved.   LOS: 6 days   Plan: Transfer to floor; wait for ileus to resolve; continue ng; start bid wet to dry dressing changes.   Teresa [name] J 04/21/2011

## 2011-04-22 ENCOUNTER — Telehealth: Payer: Self-pay | Admitting: Cardiology

## 2011-04-22 MED ORDER — AQUAPHOR EX OINT
TOPICAL_OINTMENT | CUTANEOUS | Status: AC
  Filled 2011-04-22: qty 50

## 2011-04-22 MED ORDER — CARVEDILOL 25 MG PO TABS
25.0000 mg | ORAL_TABLET | Freq: Two times a day (BID) | ORAL | Status: DC
  Administered 2011-04-22 – 2011-04-23 (×2): 25 mg via ORAL
  Filled 2011-04-22 (×4): qty 1

## 2011-04-22 MED ORDER — POTASSIUM PHOSPHATE DIBASIC 3 MMOLE/ML IV SOLN
10.0000 mmol | Freq: Once | INTRAVENOUS | Status: AC
  Administered 2011-04-22: 10 mmol via INTRAVENOUS
  Filled 2011-04-22: qty 3.33

## 2011-04-22 NOTE — Progress Notes (Signed)
MEDICATION RELATED CONSULT NOTE - FOLLOW UP   Pharmacy Consult for Phosphorous Replacement Indication: Hypophosphatemia  No Known Allergies  Patient Measurements: Height: 5\' 4"  (162.6 cm) Weight: 157 lb 11.2 oz (71.532 kg) IBW/kg (Calculated) : 54.7    Vital Signs: Temp: 98.1 F (36.7 C) (11/06 0515) Temp src: Oral (11/06 0515) BP: 125/77 mmHg (11/06 0515) Pulse Rate: 77  (11/06 0515) Intake/Output from previous day: 11/05 0701 - 11/06 0700 In: 3785 [P.O.:320; I.V.:3415; IV Piggyback:50] Out: 2380 [Urine:950; Emesis/NG output:1430] Intake/Output from this shift: Total I/O In: 30 [NG/GT:30] Out: 200 [Emesis/NG output:200]  Labs:  Basename 04/22/11 1028 04/21/11 0315 04/20/11 0414  WBC -- -- 15.1*  HGB -- -- 10.5*  HCT -- -- 30.0*  PLT -- -- 128*  APTT -- -- --  CREATININE -- 0.90 0.97  LABCREA -- -- --  CREATININE -- 0.90 0.97  CREAT24HRUR -- -- --  MG -- -- --  PHOS 2.2* 1.9* 1.3*  ALBUMIN -- -- --  PROT -- -- --  ALBUMIN -- -- --  AST -- -- --  ALT -- -- --  ALKPHOS -- -- --  BILITOT -- -- --  BILIDIR -- -- --  IBILI -- -- --   Estimated Creatinine Clearance: 56.4 ml/min (by C-G formula based on Cr of 0.9).   Microbiology: Recent Results (from the past 720 hour(s))  SURGICAL PCR SCREEN     Status: Normal   Collection Time   04/18/11  9:19 AM      Component Value Range Status Comment   MRSA, PCR NEGATIVE  NEGATIVE  Final    Staphylococcus aureus NEGATIVE  NEGATIVE  Final   URINE CULTURE     Status: Normal   Collection Time   04/19/11  9:29 AM      Component Value Range Status Comment   Specimen Description URINE, CATHETERIZED   Final    Special Requests IMMUNE:NORM UT SYMPT:NEG   Final    Setup Time [id number]   Final    Colony Count NO GROWTH   Final    Culture NO GROWTH   Final    Report Status 04/20/2011 FINAL   Final   URINE CULTURE     Status: Normal   Collection Time   04/20/11  6:52 AM      Component Value Range Status Comment   Specimen Description URINE, CATHETERIZED   Final    Special Requests NONE   Final    Setup Time [id number]   Final    Colony Count NO GROWTH   Final    Culture NO GROWTH   Final    Report Status 04/21/2011 FINAL   Final     Medications:  Scheduled:    . chlorhexidine  15 mL Mouth/Throat BID  . famotidine (PEPCID) IV  20 mg Intravenous Q12H  . heparin subcutaneous  5,000 Units Subcutaneous Q8H  . HYDROmorphone PCA 0.3 mg/mL   Intravenous Q4H  . potassium phosphate IVPB (mmol)  10 mmol Intravenous Once  . potassium phosphate IVPB (mmol)  15 mmol Intravenous Once    Assessment:  Phosphorous level improving, now 2.2 after receiving 97mmol KPhos x 2 days.  Still needs replacement to achieve normal phosphorous level.  Since Phos 2.2 and K+<4.0, will dose 0.73mmol/kg (~40mmol) IV over 6 hours for today's supplementation.   Goal of Therapy:  Normal phosphorous range: 2.3-4.6 mg/dL  Plan:  Potassium phosphate 78mmol IV today over 6 hours. F/u Phosphorous level and BMET in AM.  Teresa [name],  Teresa [name] 04/22/2011,11:34 AM

## 2011-04-22 NOTE — Telephone Encounter (Signed)
Per daughter - pt was admitted to [facility] on Sunday and had to have emergent surgery because her "intestines were twisted".  Daughter is concerned that pt is not receiving her home medications as ordered.  explained to daughter that pt's kidney functions were measurably elevated onadmission and now   they are back to normal her MD may go on and restart these.  Daughter states that pt's weight on admit was 143 lbs and is now 159 lbs and she is starting to have edema at her feet and ankles.  She wants Dr Percival Spanish to be notified that pt is at [facility].  Trish paged and a staff message sent to Dr Percival Spanish.

## 2011-04-22 NOTE — Telephone Encounter (Signed)
[name] daughter called back pt's mom is in [facility]

## 2011-04-22 NOTE — Progress Notes (Signed)
  Subjective: Pt ok. Small amt flatus this am. NG output still bilious. Pain control adequate. Has been OOB, walking well. Voiding well.  Objective: Vital signs in last 24 hours: Temp:  [97.7 F (36.5 C)-98.6 F (37 C)] 98.1 F (36.7 C) (11/06 0515) Pulse Rate:  [60-98] 77  (11/06 0515) Resp:  [18-27] 18  (11/06 0515) BP: (116-125)/(41-77) 125/77 mmHg (11/06 0515) SpO2:  [95 %-99 %] 95 % (11/06 0515) Weight:  [157 lb 11.2 oz (71.532 kg)-161 lb 2.5 oz (73.1 kg)] 157 lb 11.2 oz (71.532 kg) (11/06 0515) Last BM Date: 04/14/11  Intake/Output this shift:    Physical Exam: Abdomen: Few BS, NT, wd mostly clean. Some drainage deep in wound bed. No periwound erythema or tenderness.   Lab Results:   [facility] 04/20/11 0414  WBC 15.1*  HGB 10.5*  HCT 30.0*  PLT 128*   BMET  Basename 04/21/11 0315 04/20/11 0414  NA 136 138  K 3.6 3.6  CL 105 107  CO2 23 24  GLUCOSE 94 105*  BUN 12 19  CREATININE 0.90 0.97  CALCIUM 7.6* 7.7*   PT/INR No results found for this basename: LABPROT:2,INR:2 in the last 72 hours ABG No results found for this basename: PHART:2,PCO2:2,PO2:2,HCO3:2 in the last 72 hours  Studies/Results: No results found.   Assessment/Plan: SBO s/p extensive LOA and partial SBR X 2. Post op ileus Await return of bowel fxn. Was ready to start TNA today but positive flatus encouraging. If it continues can DC NG soon and initiate diet.    LOS: 7 days    Levon Penning F 04/22/2011  2

## 2011-04-22 NOTE — Progress Notes (Signed)
Pt seen and examined.  Agree with KB's note.

## 2011-04-22 NOTE — Telephone Encounter (Signed)
[name] dtr calling re pt being in hospital and not being administered heart meds, now her feet are swelling, wants [place] to call the hosptial and make sure she gets her meds

## 2011-04-23 DIAGNOSIS — R0602 Shortness of breath: Secondary | ICD-10-CM

## 2011-04-23 LAB — BASIC METABOLIC PANEL
CO2: 26 mEq/L (ref 19–32)
Calcium: 7.9 mg/dL — ABNORMAL LOW (ref 8.4–10.5)
Chloride: 102 mEq/L (ref 96–112)
Sodium: 135 mEq/L (ref 135–145)

## 2011-04-23 LAB — DIFFERENTIAL
Eosinophils Relative: 1 % (ref 0–5)
Monocytes Relative: 6 % (ref 3–12)
Neutrophils Relative %: 73 % (ref 43–77)

## 2011-04-23 LAB — CBC
Platelets: 155 10*3/uL (ref 150–400)
RBC: 2.98 MIL/uL — ABNORMAL LOW (ref 3.87–5.11)
WBC: 10.2 10*3/uL (ref 4.0–10.5)

## 2011-04-23 LAB — PHOSPHORUS: Phosphorus: 2.8 mg/dL (ref 2.3–4.6)

## 2011-04-23 MED ORDER — CARVEDILOL 12.5 MG PO TABS
12.5000 mg | ORAL_TABLET | Freq: Two times a day (BID) | ORAL | Status: DC
  Administered 2011-04-23 – 2011-05-02 (×18): 12.5 mg via ORAL
  Filled 2011-04-23 (×20): qty 1

## 2011-04-23 NOTE — Progress Notes (Signed)
INITIAL ADULT NUTRITION ASSESSMENT Date: 04/23/2011   Time: 3:32 PM Reason for Assessment: NPO/Clear liquids x 7 days  ASSESSMENT: Female 70 y.o.  Dx: SBO  Hx:  Past Medical History  Diagnosis Date  . Nonischemic cardiomyopathy     EF is 25% per echo February 2012, non ischemic myoview 12/2009.  EF 50% echo 7/12  . HTN (hypertension)   . PVC (premature ventricular contraction)   . Diabetes mellitus   . Tobacco abuse   . Heart palpitations   . Valvular heart disease     Moderate to severe MR, moderate TR, LAE per echo 7/11  . Glaucoma   . Glaucoma     history of  . Spinal stenosis    Related Meds:  Scheduled Meds:   . carvedilol  12.5 mg Oral BID WC  . chlorhexidine  15 mL Mouth/Throat BID  . famotidine (PEPCID) IV  20 mg Intravenous Q12H  . heparin subcutaneous  5,000 Units Subcutaneous Q8H  . HYDROmorphone PCA 0.3 mg/mL   Intravenous Q4H  . mineral oil-hydrophilic petrolatum      . potassium phosphate IVPB (mmol)  10 mmol Intravenous Once  . DISCONTD: carvedilol  25 mg Oral BID WC   Continuous Infusions:   . dextrose 5 % and 0.45 % NaCl with KCl 10 mEq/L 100 mL/hr at 04/23/11 1403   PRN Meds:.diphenhydrAMINE, diphenhydrAMINE, diphenhydrAMINE, LORazepam, naloxone, ondansetron (ZOFRAN) IV, ondansetron (ZOFRAN) IV, promethazine, sodium chloride  Ht: 5\' 4"  (162.6 cm)  Wt: 157 lb 11.2 oz (71.532 kg)  Ideal Wt: 54.7 kg  % Ideal Wt: 130   Body mass index is 27.07 kg/(m^2).  Food/Nutrition Related Hx: Pt reports eating well PTA with no changes in weight or appetite. Pt denies any difficulty swallowing. Pt tolerating clear liquid diet, no c/o nausea. Pt eager to have diet advanced.   Labs: CMP     Component Value Date/Time   NA 135 04/23/2011 0452   K 3.7 04/23/2011 0452   CL 102 04/23/2011 0452   CO2 26 04/23/2011 0452   GLUCOSE 102* 04/23/2011 0452   BUN 7 04/23/2011 0452   CREATININE 0.83 04/23/2011 0452   CALCIUM 7.9* 04/23/2011 0452   PROT 6.6 09/12/2010 0812   ALBUMIN 3.9 09/12/2010 0812   AST 23 09/12/2010 0812   ALT 22 09/12/2010 0812   ALKPHOS 73 09/12/2010 0812   BILITOT 0.2* 09/12/2010 0812   GFRNONAA 70* 04/23/2011 0452   GFRAA 81* 04/23/2011 0452    Intake/Output Summary (Last 24 hours) at 04/23/11 1555 Last data filed at 04/23/11 1418  Gross per 24 hour  Intake 2747.88 ml  Output   1950 ml  Net 797.88 ml    Diet Order: Clear Liquid  IVF:    dextrose 5 % and 0.45 % NaCl with KCl 10 mEq/L Last Rate: 100 mL/hr at 04/23/11 1403    Estimated Nutritional Needs:   Kcal:1550-1850 Protein:85-100g Fluid:1.5-1.8L  NUTRITION DIAGNOSIS: -Inadequate oral intake (NI-2.1).  Status: Ongoing  RELATED TO: SBO  AS EVIDENCE BY: clear liquid diet  MONITORING/EVALUATION(Goals): Advance diet as tolerated to low residue, diabetic diet  EDUCATION NEEDS: -No education needs identified at this time  INTERVENTION: Diet advancement per MD. Will monitor.   Dietitian # [phone]  DOCUMENTATION CODES Per approved criteria  -Not Applicable    Glory Rosebush 04/23/2011, 3:32 PM

## 2011-04-23 NOTE — Progress Notes (Signed)
MEDICATION RELATED CONSULT NOTE - FOLLOW UP   Pharmacy Consult for Phosphorous Replacement Indication: Hypophosphatemia  No Known Allergies  Patient Measurements: Height: 5\' 4"  (162.6 cm) Weight: 157 lb 11.2 oz (71.532 kg) IBW/kg (Calculated) : 54.7    Vital Signs: Temp: 98.8 F (37.1 C) (11/07 0510) Temp src: Oral (11/07 0510) BP: 127/72 mmHg (11/07 0840) Pulse Rate: 65  (11/07 0840) Intake/Output from previous day: 11/06 0701 - 11/07 0700 In: 1637.9 [P.O.:5; I.V.:1521.9; NG/GT:60; IV Piggyback:50] Out: 2600 [Urine:1150; Emesis/NG output:1450] Intake/Output from this shift: Total I/O In: 120 [P.O.:120] Out: -   Labs:  Basename 04/23/11 0452 04/22/11 1028 04/21/11 0315  WBC 10.2 -- --  HGB 9.8* -- --  HCT 28.4* -- --  PLT 155 -- --  APTT -- -- --  CREATININE 0.83 -- 0.90  LABCREA -- -- --  CREATININE 0.83 -- 0.90  CREAT24HRUR -- -- --  MG -- -- --  PHOS 2.8 2.2* 1.9*  ALBUMIN -- -- --  PROT -- -- --  ALBUMIN -- -- --  AST -- -- --  ALT -- -- --  ALKPHOS -- -- --  BILITOT -- -- --  BILIDIR -- -- --  IBILI -- -- --   Estimated Creatinine Clearance: 61.1 ml/min (by C-G formula based on Cr of 0.83).   Microbiology: Recent Results (from the past 720 hour(s))  SURGICAL PCR SCREEN     Status: Normal   Collection Time   04/18/11  9:19 AM      Component Value Range Status Comment   MRSA, PCR NEGATIVE  NEGATIVE  Final    Staphylococcus aureus NEGATIVE  NEGATIVE  Final   URINE CULTURE     Status: Normal   Collection Time   04/19/11  9:29 AM      Component Value Range Status Comment   Specimen Description URINE, CATHETERIZED   Final    Special Requests IMMUNE:NORM UT SYMPT:NEG   Final    Setup Time [id number]   Final    Colony Count NO GROWTH   Final    Culture NO GROWTH   Final    Report Status 04/20/2011 FINAL   Final   URINE CULTURE     Status: Normal   Collection Time   04/20/11  6:52 AM      Component Value Range Status Comment   Specimen  Description URINE, CATHETERIZED   Final    Special Requests NONE   Final    Setup Time [id number]   Final    Colony Count NO GROWTH   Final    Culture NO GROWTH   Final    Report Status 04/21/2011 FINAL   Final     Medications:  Scheduled:     . carvedilol  25 mg Oral BID WC  . chlorhexidine  15 mL Mouth/Throat BID  . famotidine (PEPCID) IV  20 mg Intravenous Q12H  . heparin subcutaneous  5,000 Units Subcutaneous Q8H  . HYDROmorphone PCA 0.3 mg/mL   Intravenous Q4H  . mineral oil-hydrophilic petrolatum      . potassium phosphate IVPB (mmol)  10 mmol Intravenous Once    Assessment:  Patient has achieved a normal phosphorous level after 3 days of potassium phosphorous supplementation. No further supplementation required today, but will continue to follow a level tomorrow to make sure patient remains WNL. K+ remained WNL as we supplemented.  Goal of Therapy:  Normal phosphorous range: 2.3-4.6 mg/dL  Plan:  No further phosphorous supplementation today. F/u Phosphorous level  in AM.  Hershal Coria 04/23/2011,12:54 PM

## 2011-04-23 NOTE — Progress Notes (Signed)
Pt seen and examined.  Agree with KB's note. Still with some necrotic tissue in wound.  Will increase dressing change frequency.

## 2011-04-23 NOTE — Consult Note (Signed)
Reason for Consult: Volume overload Referring Physician: Jackolyn Confer, M.D.  Teresa [name] is an 70 y.o. female.  HPI: I saw Mrs. true on October 30 for preoperative consultation. She has a history of a nonischemic cardiomyopathy. Ejection fraction originally was in the 20-25% range. Her most recent echocardiogram performed in July was 45-50%. She was admitted with a small bowel obstruction. He  She had surgery to correct her small bowel obstruction. She's done very well since that time. She denies any chest pain or shortness breath. Because of her surgery and because of her small bowel junction, she is not been taking anything by mouth. In addition, she is not taking her standard medications.  Her daughter who lives in [place] became very concerned that her weight was up and that she was on her medications. She requested [place] to come back and see Mrs. Lear for further evaluation.  Mrs. true is doing very well. She is recovering slowly but steadily from her surgery. Her bowels are just now starting to move. She just had her NG tube removed yesterday.  And she was admitted on October 3, she denied any syncope or presyncope. She denies any chest pain or shortness breath.  Past Medical History  Diagnosis Date  . Nonischemic cardiomyopathy     EF is 25% per echo February 2012, non ischemic myoview 12/2009.  EF 50% echo 7/12  . HTN (hypertension)   . PVC (premature ventricular contraction)   . Diabetes mellitus   . Tobacco abuse   . Heart palpitations   . Valvular heart disease     Moderate to severe MR, moderate TR, LAE per echo 7/11  . Glaucoma   . Glaucoma     history of  . Spinal stenosis     Past Surgical History  Procedure Date  . Knee arthroscopy   . Tubal ligation   . Glaucoma surgery   . Abdominal hernia repair   . Right knee surgery   . Thyroidectomy     Family History  Problem Relation Age of Onset  . Stroke Brother     Social History:  reports that she has been  smoking Cigarettes.  She has never used smokeless tobacco. She reports that she does not drink alcohol or use illicit drugs.  Medications:     . carvedilol  25 mg Oral BID WC  . chlorhexidine  15 mL Mouth/Throat BID  . famotidine (PEPCID) IV  20 mg Intravenous Q12H  . heparin subcutaneous  5,000 Units Subcutaneous Q8H  . HYDROmorphone PCA 0.3 mg/mL   Intravenous Q4H  . mineral oil-hydrophilic petrolatum      . potassium phosphate IVPB (mmol)  10 mmol Intravenous Once      . dextrose 5 % and 0.45 % NaCl with KCl 10 mEq/L 100 mL/hr at 04/23/11 1403    Allergies: No Known Allergies  Results for orders placed during the hospital encounter of 04/15/11 (from the past 48 hour(s))  PHOSPHORUS     Status: Abnormal   Collection Time   04/22/11 10:28 AM      Component Value Range Comment   Phosphorus 2.2 (*) 2.3 - 4.6 (mg/dL)   CBC     Status: Abnormal   Collection Time   04/23/11  4:52 AM      Component Value Range Comment   WBC 10.2  4.0 - 10.5 (K/uL)    RBC 2.98 (*) 3.87 - 5.11 (MIL/uL)    Hemoglobin 9.8 (*) 12.0 - 15.0 (g/dL)  HCT 28.4 (*) 36.0 - 46.0 (%)    MCV 95.3  78.0 - 100.0 (fL)    MCH 32.9  26.0 - 34.0 (pg)    MCHC 34.5  30.0 - 36.0 (g/dL)    RDW 12.6  11.5 - 15.5 (%)    Platelets 155  150 - 400 (K/uL)   DIFFERENTIAL     Status: Normal   Collection Time   04/23/11  4:52 AM      Component Value Range Comment   Neutrophils Relative 73  43 - 77 (%)    Lymphocytes Relative 20  12 - 46 (%)    Monocytes Relative 6  3 - 12 (%)    Eosinophils Relative 1  0 - 5 (%)    Basophils Relative 0  0 - 1 (%)    Neutro Abs 7.5  1.7 - 7.7 (K/uL)    Lymphs Abs 2.0  0.7 - 4.0 (K/uL)    Monocytes Absolute 0.6  0.1 - 1.0 (K/uL)    Eosinophils Absolute 0.1  0.0 - 0.7 (K/uL)    Basophils Absolute 0.0  0.0 - 0.1 (K/uL)    WBC Morphology ATYPICAL LYMPHOCYTES     BASIC METABOLIC PANEL     Status: Abnormal   Collection Time   04/23/11  4:52 AM      Component Value Range Comment   Sodium  135  135 - 145 (mEq/L)    Potassium 3.7  3.5 - 5.1 (mEq/L)    Chloride 102  96 - 112 (mEq/L)    CO2 26  19 - 32 (mEq/L)    Glucose, Bld 102 (*) 70 - 99 (mg/dL)    BUN 7  6 - 23 (mg/dL)    Creatinine, Ser 0.83  0.50 - 1.10 (mg/dL)    Calcium 7.9 (*) 8.4 - 10.5 (mg/dL)    GFR calc non Af Amer 70 (*) >90 (mL/min)    GFR calc Af Amer 81 (*) >90 (mL/min)   PHOSPHORUS     Status: Normal   Collection Time   04/23/11  4:52 AM      Component Value Range Comment   Phosphorus 2.8  2.3 - 4.6 (mg/dL)     No results found.  Review of systems as noted in the history of present illness. All of the systems were reviewed and are negative.    Intake/Output Summary (Last 24 hours) at 04/23/11 1430 Last data filed at 04/23/11 1418  Gross per 24 hour  Intake 2507.88 ml  Output   1950 ml  Net 557.88 ml   Blood pressure 127/72, pulse 65, temperature 98.8 F (37.1 C), temperature source Oral, resp. rate 18, height 5\' 4"  (1.626 m), weight 157 lb 11.2 oz (71.532 kg), SpO2 93.00%. The patient is alert and oriented x 3.  The mood and affect are normal.   Skin: warm and dry.  Color is normal.    HEENT:   the sclera are nonicteric.  The mucous membranes are moist.  The carotids are 2+ without bruits.  There is no thyromegaly.  There is no JVD.    Lungs: clear.  The chest wall is non tender.    Heart: regular rate with a normal S1 and S2.  There are no murmurs, gallops, or rubs. The PMI is not displaced.     Abdomen: good bowel sounds.  There is no guarding or rebound.  There is no hepatosplenomegaly or tenderness.  There are no masses.   Extremities:  no clubbing, cyanosis,  or edema.  The legs are without rashes.  The distal pulses are intact.   Neuro:  Cranial nerves II - XII are intact.  Motor and sensory functions are intact.     Assessment/Plan:  1. History of dilated cardiomyopathy  Mrs. Edminster has a history of a  cardiomyopathy but this has almost completely resolved.  Her ejection fraction is  now near normal ( 45-50%)   She started taking some medicines by mouth. She's currently on carvedilol 25 mg here and we'll decrease her carvedilol to 12.5 mg twice a day.  Her home dose of carvedilol was 25 mg twice a day   Said she is taking some by mouth, I'll reduce her IV fluids down to 20 cc an hour. We can discontinue these fluids completely when she's eating a regular diet - possible tomorrow.  I don't think that she needs Lasix at this point. She is breathing very comfortably. She's diuresing on her own very nicely.  She's also having diarrhea and abdominal her to become to volume depleted. We can give her Lasix 40 mg by mouth as needed.  Her home dose of Lasix 40 mg in the morning and 20 mg at night.  We will restart her on losartan 25 mg a day but she's eating a more normal diet.  Her home dose of losartan was 50 mg a day  I reassured her that we will start most of her medications when she's closer to discharge. We'll add further medications as an outpatient if they are needed.  2. Hypertension Her blood pressure is normal at this point.   3. Small bowel obstruction She's had surgery for small bowel suction. She's had repeated episodes of small bowel obstruction. She'll need a followup with her general medical doctor for this.  4. Diabetes mellitus Plans per general medical Dr.   Thayer Headings, Brooke Bonito., MD, [facility] 04/23/2011, 2:22 PM

## 2011-04-23 NOTE — Progress Notes (Signed)
  Subjective: Doing well. Passing plenty of flatus and having small BMs. No nausea or belching. Pain control adequate.  Objective: Vital signs in last 24 hours: Temp:  [97.7 F (36.5 C)-98.8 F (37.1 C)] 98.8 F (37.1 C) (11/07 0510) Pulse Rate:  [58-79] 72  (11/07 0510) Resp:  [16-18] 16  (11/07 0510) BP: (110-116)/(62-78) 116/62 mmHg (11/07 0510) SpO2:  [93 %-97 %] 93 % (11/07 0510) Last BM Date: 04/14/11  Intake/Output this shift:    Physical Exam: Abdomen: ND, minimal tenderness. Wound, clean still some dirty serous fluid drainage. No frank pus or feculent materiral. No erythema. Good BS   Lab Results:   [facility] 04/23/11 0452  WBC 10.2  HGB 9.8*  HCT 28.4*  PLT 155   BMET  Basename 04/23/11 0452 04/21/11 0315  NA 135 136  K 3.7 3.6  CL 102 105  CO2 26 23  GLUCOSE 102* 94  BUN 7 12  CREATININE 0.83 0.90  CALCIUM 7.9* 7.6*   PT/INR No results found for this basename: LABPROT:2,INR:2 in the last 72 hours ABG No results found for this basename: PHART:2,PCO2:2,PO2:2,HCO3:2 in the last 72 hours  Studies/Results: No results found.   Assessment/Plan: Patient Active Problem List  Diagnoses  . Nonischemic cardiomyopathy  . HTN (hypertension)  . Tobacco abuse  . Chest pain  SBO: s/p LOA and partial; SBR X 2 Post op ileus resolving.  Will DC NG and begin sips of clears. Cont oob, walking.    LOS: 8 days    Federico Flake 04/23/2011

## 2011-04-24 DIAGNOSIS — I5022 Chronic systolic (congestive) heart failure: Secondary | ICD-10-CM

## 2011-04-24 DIAGNOSIS — K56609 Unspecified intestinal obstruction, unspecified as to partial versus complete obstruction: Secondary | ICD-10-CM | POA: Diagnosis present

## 2011-04-24 DIAGNOSIS — I428 Other cardiomyopathies: Secondary | ICD-10-CM

## 2011-04-24 LAB — PHOSPHORUS: Phosphorus: 2.2 mg/dL — ABNORMAL LOW (ref 2.3–4.6)

## 2011-04-24 MED ORDER — LORAZEPAM 2 MG/ML IJ SOLN
0.5000 mg | Freq: Four times a day (QID) | INTRAMUSCULAR | Status: DC | PRN
  Administered 2011-04-24 – 2011-05-01 (×7): 0.5 mg via INTRAVENOUS
  Filled 2011-04-24 (×6): qty 1

## 2011-04-24 MED ORDER — POTASSIUM PHOSPHATE DIBASIC 3 MMOLE/ML IV SOLN
15.0000 mmol | Freq: Once | INTRAVENOUS | Status: AC
  Administered 2011-04-24: 15 mmol via INTRAVENOUS
  Filled 2011-04-24: qty 5

## 2011-04-24 NOTE — Progress Notes (Signed)
Subjective:  Pt feeling better. Passing flatus and had loose stools yesterday. No chest pain or dyspnea.  Objective:  Vital Signs in the last 24 hours: Temp:  [98.8 F (37.1 C)-99.3 F (37.4 C)] 99.3 F (37.4 C) (11/07 2100) Pulse Rate:  [60-104] 104  (11/07 2100) Resp:  [16-24] 16  (11/08 0400) BP: (115-127)/(68-90) 121/90 mmHg (11/07 2100) SpO2:  [2 %-94 %] 92 % (11/08 0400)  Intake/Output from previous day: 11/07 0701 - 11/08 0700 In: 2209 [P.O.:600; I.V.:1609] Out: 200 [Urine:200]  Physical Exam: Pt is alert and oriented, NAD HEENT: normal Neck: JVP - normal Lungs: CTA bilaterally CV: RRR without murmur or gallop Abd: soft, NT, Positive BS Ext: no C/C/E, distal pulses intact and equal Skin: warm/dry no rash   Lab Results:  Basename 04/23/11 0452  WBC 10.2  HGB 9.8*  PLT 155    Basename 04/23/11 0452  NA 135  K 3.7  CL 102  CO2 26  GLUCOSE 102*  BUN 7  CREATININE 0.83   No results found for this basename: TROPONINI:2,CK,MB:2 in the last 72 hours  Assessment/Plan:  Chronic systolic heart failure secondary to nonischemic cardiomyopathy. Pt remains stable from cardiac perspective. She is now taking PO and is back on carvedilol. Will follow and probably start back on losartan tomorrow depending on BP and fluid status. Doesn't need furosemide at present as she is clinically euvolemic and need to watch for volume depletion in setting of diarrhea.   Sherren Mocha, M.D. 04/24/2011, 6:00 AM

## 2011-04-24 NOTE — Progress Notes (Signed)
MEDICATION RELATED CONSULT NOTE - FOLLOW UP   Pharmacy Consult for Phosphorous Replacement Indication: Hypophosphatemia  No Known Allergies  Patient Measurements: Height: 5\' 4"  (162.6 cm) Weight: 159 lb 13.3 oz (72.5 kg) IBW/kg (Calculated) : 54.7    Vital Signs: Temp: 98.8 F (37.1 C) (11/08 0615) Temp src: Oral (11/08 0615) BP: 118/58 mmHg (11/08 0615) Pulse Rate: 71  (11/08 0615) Intake/Output from previous day: 11/07 0701 - 11/08 0700 In: 2621 [P.O.:600; I.V.:2021] Out: 200 [Urine:200] Intake/Output from this shift: Total I/O In: 120 [P.O.:120] Out: -   Labs:  Basename 04/24/11 0315 04/23/11 0452 04/22/11 1028  WBC -- 10.2 --  HGB -- 9.8* --  HCT -- 28.4* --  PLT -- 155 --  APTT -- -- --  CREATININE -- 0.83 --  LABCREA -- -- --  CREATININE -- 0.83 --  CREAT24HRUR -- -- --  MG -- -- --  PHOS 2.2* 2.8 2.2*  ALBUMIN -- -- --  PROT -- -- --  ALBUMIN -- -- --  AST -- -- --  ALT -- -- --  ALKPHOS -- -- --  BILITOT -- -- --  BILIDIR -- -- --  IBILI -- -- --   Estimated Creatinine Clearance: 61.5 ml/min (by C-G formula based on Cr of 0.83).   Microbiology:   Medications:  Scheduled:     . carvedilol  12.5 mg Oral BID WC  . chlorhexidine  15 mL Mouth/Throat BID  . famotidine (PEPCID) IV  20 mg Intravenous Q12H  . heparin subcutaneous  5,000 Units Subcutaneous Q8H  . HYDROmorphone PCA 0.3 mg/mL   Intravenous Q4H  . DISCONTD: carvedilol  25 mg Oral BID WC    Assessment: Phosphorus level falling again, just below normal range.  Goal of Therapy:  Normal phosphorous range: 2.3-4.6 mg/dL  Plan:  KPhos 15 mM IV x 1 today. F/u Phosphorous level in AM.  Shanteria Laye K 04/24/2011,12:58 PM

## 2011-04-24 NOTE — Progress Notes (Signed)
  Subjective: Hungry.  Bowels moving and passing gas. Objective: Vital signs in last 24 hours: Temp:  [98.8 F (37.1 C)-99.3 F (37.4 C)] 98.8 F (37.1 C) (11/08 0615) Pulse Rate:  [60-104] 71  (11/08 0615) Resp:  [16-18] 18  (11/08 0615) BP: (115-127)/(58-90) 118/58 mmHg (11/08 0615) SpO2:  [92 %-94 %] 92 % (11/08 0615) Weight:  [159 lb 13.3 oz (72.5 kg)] 159 lb 13.3 oz (72.5 kg) (11/08 0700) Last BM Date: 04/23/11  Intake/Output from previous day: 11/07 0701 - 11/08 0700 In: 2621 [P.O.:600; I.V.:2021] Out: 200 [Urine:200] Intake/Output this shift:    PE: Abd-soft, open wound with necrotic base-debrided.  Lab Results:   [facility] 04/23/11 0452  WBC 10.2  HGB 9.8*  HCT 28.4*  PLT 155   BMET  Basename 04/23/11 0452  NA 135  K 3.7  CL 102  CO2 26  GLUCOSE 102*  BUN 7  CREATININE 0.83  CALCIUM 7.9*   PT/INR No results found for this basename: LABPROT:2,INR:2 in the last 72 hours ABG No results found for this basename: PHART:2,PCO2:2,PO2:2,HCO3:2 in the last 72 hours  Studies/Results: No results found.  Anti-infectives: Anti-infectives    None      Assessment Active Problems:  SBO (small bowel obstruction), s/p expl lap-ileus has resolved.  Open wound    LOS: 9 days   Plan: Advance diet. Ambulate.  Abbygayle Helfand J 04/24/2011

## 2011-04-25 LAB — BASIC METABOLIC PANEL
Calcium: 7.7 mg/dL — ABNORMAL LOW (ref 8.4–10.5)
Creatinine, Ser: 0.78 mg/dL (ref 0.50–1.10)
GFR calc Af Amer: >90 mL/min (ref >90)

## 2011-04-25 LAB — CLOSTRIDIUM DIFFICILE BY PCR: Toxigenic C. Difficile by PCR: NEGATIVE

## 2011-04-25 LAB — PHOSPHORUS: Phosphorus: 3 mg/dL (ref 2.3–4.6)

## 2011-04-25 MED ORDER — OXYCODONE-ACETAMINOPHEN 5-325 MG PO TABS
1.0000 | ORAL_TABLET | ORAL | Status: DC | PRN
  Administered 2011-04-25 – 2011-05-02 (×25): 2 via ORAL
  Filled 2011-04-25 (×25): qty 2

## 2011-04-25 MED ORDER — POTASSIUM CHLORIDE CRYS ER 20 MEQ PO TBCR
40.0000 meq | EXTENDED_RELEASE_TABLET | Freq: Two times a day (BID) | ORAL | Status: AC
  Administered 2011-04-25 (×2): 40 meq via ORAL
  Filled 2011-04-25 (×3): qty 2

## 2011-04-25 MED ORDER — FUROSEMIDE 40 MG PO TABS
40.0000 mg | ORAL_TABLET | Freq: Every day | ORAL | Status: DC
  Administered 2011-04-25 – 2011-05-01 (×7): 40 mg via ORAL
  Filled 2011-04-25 (×9): qty 1

## 2011-04-25 MED ORDER — FAMOTIDINE 20 MG PO TABS
20.0000 mg | ORAL_TABLET | Freq: Two times a day (BID) | ORAL | Status: DC
  Administered 2011-04-25 – 2011-05-02 (×15): 20 mg via ORAL
  Filled 2011-04-25 (×20): qty 1

## 2011-04-25 MED ORDER — LOSARTAN POTASSIUM 25 MG PO TABS
25.0000 mg | ORAL_TABLET | Freq: Every day | ORAL | Status: DC
  Administered 2011-04-25 – 2011-05-02 (×8): 25 mg via ORAL
  Filled 2011-04-25 (×9): qty 1

## 2011-04-25 NOTE — Progress Notes (Signed)
Physical Therapy Evaluation Patient Details Name: Teresa [name] MRN: [id number] DOB: [birth year] Today's Date: 04/25/2011 1450-1521 Ev2  Problem List:  Patient Active Problem List  Diagnoses  . Nonischemic cardiomyopathy  . HTN (hypertension)  . Tobacco abuse  . Chest pain  . SBO (small bowel obstruction), s/p expl lap    Past Medical History:  Past Medical History  Diagnosis Date  . Nonischemic cardiomyopathy     EF is 25% per echo February 2012, non ischemic myoview 12/2009.  EF 50% echo 7/12  . HTN (hypertension)   . PVC (premature ventricular contraction)   . Diabetes mellitus   . Tobacco abuse   . Heart palpitations   . Valvular heart disease     Moderate to severe MR, moderate TR, LAE per echo 7/11  . Glaucoma   . Glaucoma     history of  . Spinal stenosis    Past Surgical History:  Past Surgical History  Procedure Date  . Knee arthroscopy   . Tubal ligation   . Glaucoma surgery   . Abdominal hernia repair   . Right knee surgery   . Thyroidectomy     PT Assessment/Plan/Recommendation PT Assessment Clinical Impression Statement: Patient presents to PT s/p exp lap for SBO with open abdominal wound with decreased independent ambulation and decreased activity tolerance with acute pain, decreased strength and will benefit from skilled PT in the acute care setting to allow for maximal indpendence for d/c home with HHPT and intermittent assist. PT Recommendation/Assessment: Patient will need skilled PT in the acute care venue PT Problem List: Decreased strength;Decreased activity tolerance;Decreased balance;Decreased mobility;Decreased knowledge of use of DME;Pain Barriers to Discharge: None PT Therapy Diagnosis : Difficulty walking;Acute pain;Generalized weakness PT Plan PT Frequency: Min 3X/week PT Treatment/Interventions: DME instruction;Gait training;Functional mobility training;Therapeutic exercise;Balance training;Patient/family education PT  Recommendation Follow Up Recommendations: Home health PT Equipment Recommended: Other (comment) (TBA may need cane versus walker versus no device.) PT Goals  Acute Rehab PT Goals PT Goal Formulation: With patient Time For Goal Achievement: 7 days Pt will go Supine/Side to Sit: with modified independence PT Goal: Supine/Side to Sit - Progress: Progressing toward goal Pt will Ambulate: >150 feet;with modified independence;with least restrictive assistive device PT Goal: Ambulate - Progress: Progressing toward goal  PT Evaluation Precautions/Restrictions  Restrictions Weight Bearing Restrictions: No Prior Functioning  Home Living Lives With: Alone Receives Help From: Family;Friend(s) Type of Home: House Home Layout: One level Home Access: Level entry Prior Function Level of Independence: Independent with basic ADLs;Independent with homemaking with ambulation;Independent with gait Driving: Yes Vocation: Part time employment Cognition Cognition Arousal/Alertness: Awake/alert Overall Cognitive Status: Appears within functional limits for tasks assessed Sensation/Coordination Sensation Light Touch: Appears Intact Extremity Assessment RLE Assessment RLE Assessment: Exceptions to [facility] RLE Strength Right Hip Flexion: 3+/5 Right Knee Flexion: 4/5 Right Knee Extension: 5/5 Right Ankle Dorsiflexion: 5/5 LLE Assessment LLE Assessment: Exceptions to [facility] LLE Strength Left Hip Flexion: 3+/5 Left Knee Flexion: 4/5 Left Knee Extension: 5/5 Left Ankle Dorsiflexion: 5/5 Mobility (including Balance) Bed Mobility Bed Mobility: Yes Rolling Left: 5: Supervision;With rail Rolling Left Details (indicate cue type and reason): cue for technique Left Sidelying to Sit: 5: Supervision;With rails;HOB elevated (comment degrees) (HOB 40 degrees) Left Sidelying to Sit Details (indicate cue type and reason): for techique Sit to Supine - Left: 6: Modified independent (Device/Increase time);With  rail;HOB flat Transfers Transfers: Yes Sit to Stand: 6: Modified independent (Device/Increase time) Stand to Sit: 6: Modified independent (Device/Increase time) Ambulation/Gait Ambulation/Gait: Yes  Ambulation/Gait Assistance: 4: Min assist Ambulation/Gait Assistance Details (indicate cue type and reason): tried w/o walker, Pt. held wall rail so used walker instead Ambulation Distance (Feet): 150 Feet Assistive device: Rolling walker Gait Pattern: Trunk flexed  Balance Balance Assessed: Yes Static Standing Balance Static Standing - Balance Support: No upper extremity supported Static Standing - Level of Assistance: 6: Modified independent (Device/Increase time) Exercise    End of Session PT - End of Session Activity Tolerance: Patient tolerated treatment well Patient left: in bed;with family/visitor present General Behavior During Session: [facility] for tasks performed Cognition: [facility] for tasks performed  [facility] 04/25/2011, 3:44 PM

## 2011-04-25 NOTE — Progress Notes (Signed)
  Subjective: Feeling better, a little stronger. Voiding well. +BMs yesterday and cont flatus. Pain control adequate.  Objective: Vital signs in last 24 hours: Temp:  [99.1 F (37.3 C)-99.2 F (37.3 C)] 99.2 F (37.3 C) (11/09 0700) Pulse Rate:  [71-94] 71  (11/09 0700) Resp:  [16-18] 18  (11/09 0700) BP: (119-124)/(71-75) 121/75 mmHg (11/09 0700) SpO2:  [92 %-99 %] 95 % (11/09 0700) Weight:  [161 lb 13.1 oz (73.4 kg)] 161 lb 13.1 oz (73.4 kg) (11/09 0700) Last BM Date: 04/24/11  Intake/Output this shift: Total I/O In: 122.3 [I.V.:122.3] Out: -   Physical Exam: Abdomen: soft, ND. Mildly tender at wound. Wound opened more, very soupy at base. Some necrotic fascia and fat, sutures visible but intact.  Labs: CBC  Basename 04/23/11 0452  WBC 10.2  HGB 9.8*  HCT 28.4*  PLT 155   BMET  Basename 04/25/11 0432 04/23/11 0452  NA 134* 135  K 3.3* 3.7  CL 103 102  CO2 24 26  GLUCOSE 91 102*  BUN 5* 7  CREATININE 0.78 0.83  CALCIUM 7.7* 7.9*   PT/INR No results found for this basename: LABPROT:2,INR:2 in the last 72 hours ABG No results found for this basename: PHART:2,PCO2:2,PO2:2,HCO3:2 in the last 72 hours  Studies/Results: No results found.   Assessment: Active Problems:  SBO (small bowel obstruction), s/p expl lap   Plan: DC PCA, increase activity, wound care. Maximize nutrition.  LOS: 10 days    Federico Flake 04/25/2011

## 2011-04-25 NOTE — Progress Notes (Signed)
Patient seen and examined. Wound still has some necrotic tissue. This was debrided sharply. A small fascial dehiscence is present. No evisceration. We'll place an abdominal binder. Will Hep-Lock IV. We'll start oral pain medication.

## 2011-04-25 NOTE — Progress Notes (Signed)
Occupational Therapy Evaluation Patient Details Name: Teresa [name] MRN: [id number] DOB: 04/14/1941 Today's Date: 04/25/2011 10:21-10:44  Problem List:  Patient Active Problem List  Diagnoses  . Nonischemic cardiomyopathy  . HTN (hypertension)  . Tobacco abuse  . Chest pain  . SBO (small bowel obstruction), s/p expl lap    Past Medical History:  Past Medical History  Diagnosis Date  . Nonischemic cardiomyopathy     EF is 25% per echo February 2012, non ischemic myoview 12/2009.  EF 50% echo 7/12  . HTN (hypertension)   . PVC (premature ventricular contraction)   . Diabetes mellitus   . Tobacco abuse   . Heart palpitations   . Valvular heart disease     Moderate to severe MR, moderate TR, LAE per echo 7/11  . Glaucoma   . Glaucoma     history of  . Spinal stenosis    Past Surgical History:  Past Surgical History  Procedure Date  . Knee arthroscopy   . Tubal ligation   . Glaucoma surgery   . Abdominal hernia repair   . Right knee surgery   . Thyroidectomy     OT Assessment/Plan/Recommendation OT Assessment Clinical Impression Statement: This 70 year old WM presents with s/p surgery for bowel obstruction. Eval and education completed--no further OT needs, will sign off. OT Recommendation/Assessment: Patient does not need any further OT services OT Goals    OT Evaluation Precautions/Restrictions  Precautions Precautions: Other (comment) (No bending over so as to pur pressure on her abdomen) Required Braces or Orthoses: No Restrictions Weight Bearing Restrictions: No Prior Functioning Home Living Lives With: Alone Receives Help From: Family;Friend(s);Other (Comment) (prn) Type of Home: House Home Layout: One level Home Access: Level entry Bathroom Shower/Tub: Tub/shower unit;Other (comment) (Pt aware that she should only sponge bath untl MD clears) Bathroom Toilet: Standard Bathroom Accessibility: Yes How Accessible: Accessible via walker Home Adaptive  Equipment: None Prior Function Level of Independence: Independent with basic ADLs;Independent with homemaking with ambulation;Independent with gait;Independent with transfers Driving: Yes Vocation: Part time employment ADL ADL Eating/Feeding: Simulated Where Assessed - Eating/Feeding: Edge of bed Grooming: Performed;Modified independent;Wash/dry hands Where Assessed - Grooming: Standing at sink Upper Body Bathing: Simulated;Independent Where Assessed - Upper Body Bathing: Sitting, bed Lower Body Bathing: Simulated;Modified independent Where Assessed - Lower Body Bathing: Sit to stand from bed Upper Body Dressing: Simulated;Independent Where Assessed - Upper Body Dressing: Sitting, bed Lower Body Dressing: Performed;Modified independent Where Assessed - Lower Body Dressing: Sit to stand from bed Toilet Transfer: Performed;Modified independent Toilet Transfer Method: Counselling psychologist: Regular height toilet;Grab bars Toileting - Clothing Manipulation: Performed;Independent Where Assessed - Toileting Clothing Manipulation: Sit to stand from 3-in-1 or toilet Toileting - Hygiene: Performed;Independent Where Assessed - Toileting Hygiene: Sit on 3-in-1 or toilet Tub/Shower Transfer: Not assessed Tub/Shower Transfer Method: Not assessed Equipment Used: Rolling walker Vision/Perception  Vision - History Baseline Vision: No visual deficits Cognition Cognition Arousal/Alertness: Awake/alert Overall Cognitive Status: Appears within functional limits for tasks assessed Sensation/Coordination   Extremity Assessment   Mobility  Bed Mobility Bed Mobility: Yes Sit to Supine - Left: 6: Modified independent (Device/Increase time) Transfers Transfers: Yes Sit to Stand: 6: Modified independent (Device/Increase time) Stand to Sit: 6: Modified independent (Device/Increase time) Exercises   End of Session OT - End of Session Equipment Utilized During Treatment: Other  (comment) (RW) Activity Tolerance: Patient tolerated treatment well Patient left: in bed;with call bell in reach;Other (comment) (phone left within reach) Nurse Communication: Mobility status for  transfers;Mobility status for ambulation General Behavior During Session: [facility] for tasks performed Cognition: [facility] for tasks performed   Almon Register [id number] 04/25/2011, 11:11 AM

## 2011-04-25 NOTE — Progress Notes (Signed)
Subjective:  Pt feeling better, tolerating PO yesterday. No chest pain, dyspnea.  Objective:  Vital Signs in the last 24 hours: Temp:  [99.1 F (37.3 C)-99.2 F (37.3 C)] 99.2 F (37.3 C) (11/09 0700) Pulse Rate:  [71-94] 71  (11/09 0700) Resp:  [16-18] 18  (11/09 0700) BP: (119-124)/(71-75) 121/75 mmHg (11/09 0700) SpO2:  [92 %-99 %] 95 % (11/09 0700) Weight:  [73.4 kg (161 lb 13.1 oz)] 161 lb 13.1 oz (73.4 kg) (11/09 0700)  Intake/Output from previous day: 11/08 0701 - 11/09 0700 In: 1110 [P.O.:720; I.V.:340; IV Piggyback:50] Out: [id number] [Urine:1750]  Physical Exam: Pt is alert and oriented, NAD HEENT: normal Neck: JVP - normal, carotids 2+= without bruits Lungs: CTA bilaterally CV: RRR without murmur or gallop Abd: soft, NT, Positive BS, no hepatomegaly Ext: no C/C/E, distal pulses intact and equal Skin: warm/dry no rash   Lab Results:  Basename 04/23/11 0452  WBC 10.2  HGB 9.8*  PLT 155    Basename 04/25/11 0432 04/23/11 0452  NA 134* 135  K 3.3* 3.7  CL 103 102  CO2 24 26  GLUCOSE 91 102*  BUN 5* 7  CREATININE 0.78 0.83   No results found for this basename: TROPONINI:2,CK,MB:2 in the last 72 hours  Assessment/Plan:  1. Chronic systolic heart failure secondary to NICM. LVEF improved on recent echo. Recommend resume furosemide 40 mg orally per day and cozaar 25 mg daily (half of her home dose). Although no overt signs of CHF, her weight is up. Would continue carvedilol at current dose of 12.5 mg bid (also half of her home dose). Meds can be titrated back up as an outpatient. Will sign off. Please call if any questions. thx   Sherren Mocha, M.D. 04/25/2011, 8:04 AM

## 2011-04-26 MED ORDER — ENSURE PUDDING PO PUDG
1.0000 | Freq: Three times a day (TID) | ORAL | Status: DC
  Administered 2011-04-26: 1 via ORAL
  Filled 2011-04-26 (×12): qty 1

## 2011-04-26 MED ORDER — NAPHAZOLINE HCL 0.012 % OP SOLN: 1.0000 [drp] | OPHTHALMIC | Status: DC | PRN

## 2011-04-26 MED ORDER — NAPHAZOLINE HCL 0.1 % OP SOLN
1.0000 [drp] | Freq: Four times a day (QID) | OPHTHALMIC | Status: DC | PRN
  Filled 2011-04-26: qty 15

## 2011-04-26 NOTE — Progress Notes (Signed)
     Subjective: No new issues, no complaints. Tolerating diet. Pain controlled except for dressing changes   Objective: Vital signs in last 24 hours: Temp:  [97.7 F (36.5 C)-98.8 F (37.1 C)] 98.8 F (37.1 C) (11/10 0525) Pulse Rate:  [56-71] 71  (11/10 0753) Resp:  [18] 18  (11/10 0525) BP: (110-126)/(63-72) 126/67 mmHg (11/10 0753) SpO2:  [96 %] 96 % (11/10 0525) Weight:  [159 lb 2.8 oz (72.2 kg)-160 lb 15 oz (73 kg)] 159 lb 2.8 oz (72.2 kg) (11/10 0600) Last BM Date: 04/24/11  Intake/Output from previous day: 11/09 0701 - 11/10 0700 In: 1226.7 [P.O.:1080; I.V.:146.7] Out: 1500 [Urine:1500] Intake/Output this shift:    General appearance: alert, cooperative and no distress GI: wound open, foul smelling and necrotic fascia but no evidence of exposed bowel, dressing changed  Lab Results:  @LABLAST2 (wbc:2,hgb:2,hct:2,plt:2) BMET  Basename 04/25/11 0432  NA 134*  K 3.3*  CL 103  CO2 24  GLUCOSE 91  BUN 5*  CREATININE 0.78  CALCIUM 7.7*   PT/INR No results found for this basename: LABPROT:2,INR:2 in the last 72 hours ABG No results found for this basename: PHART:2,PCO2:2,PO2:2,HCO3:2 in the last 72 hours  Studies/Results: No results found.  Anti-infectives: Anti-infectives    None      Assessment/Plan: s/p  ensure tid, regular diet, continue wound care at this time but may need fascial debridement  LOS: 11 days    Madilyn Hook DAVID 04/26/2011

## 2011-04-27 NOTE — Progress Notes (Signed)
     Subjective: No new issues.  Bowels functioning.  She states that the smell is not as foul today.  Objective: Vital signs in last 24 hours: Temp:  [97.4 F (36.3 C)-98.9 F (37.2 C)] 98.9 F (37.2 C) (11/11 0618) Pulse Rate:  [69-87] 76  (11/11 0858) Resp:  [18] 18  (11/11 0618) BP: (113-122)/(62-79) 120/62 mmHg (11/11 0858) SpO2:  [93 %-95 %] 93 % (11/11 0618) Weight:  [157 lb 10.1 oz (71.5 kg)] 157 lb 10.1 oz (71.5 kg) (11/11 0500) Last BM Date: 04/26/11  Intake/Output from previous day: 11/10 0701 - 11/11 0700 In: 480 [P.O.:480] Out: 900 [Urine:900] Intake/Output this shift:    General appearance: alert, cooperative and no distress Abdominal wound dressed, wound already packed today.  Lab Results:  @LABLAST2 (wbc:2,hgb:2,hct:2,plt:2) BMET  Basename 04/25/11 0432  NA 134*  K 3.3*  CL 103  CO2 24  GLUCOSE 91  BUN 5*  CREATININE 0.78  CALCIUM 7.7*   PT/INR No results found for this basename: LABPROT:2,INR:2 in the last 72 hours ABG No results found for this basename: PHART:2,PCO2:2,PO2:2,HCO3:2 in the last 72 hours  Studies/Results: No results found.  Anti-infectives: Anti-infectives    None      Assessment/Plan: s/p  continue wound care for now, will consider formal debridement in OR  LOS: 12 days    Madilyn Hook DAVID 04/27/2011

## 2011-04-27 NOTE — Plan of Care (Signed)
Problem: Phase III Progression Outcomes Goal: IV/normal saline lock discontinued Outcome: Not Applicable Date Met:  [id number] nsl remains--pt to dc in am 04/28/11

## 2011-04-28 NOTE — Progress Notes (Signed)
   *   No surgery found *  Subjective: Weak but feeling better.   Objective: Vital signs in last 24 hours: Temp:  [98.1 F (36.7 C)-98.4 F (36.9 C)] 98.1 F (36.7 C) (11/12 1408) Pulse Rate:  [63-101] 72  (11/12 1408) Resp:  [16-18] 18  (11/12 1408) BP: (105-132)/(56-80) 105/56 mmHg (11/12 1408) SpO2:  [93 %-96 %] 93 % (11/12 1408)   Intake/Output from previous day: 11/11 0701 - 11/12 0700 In: 480 [P.O.:480] Out: 900 [Urine:900] Intake/Output this shift: Total I/O In: 320 [P.O.:320] Out: 200 [Urine:200]  GI: wound open and fascia is necrotic.  nothing to be drained  Lab Results:  No results found for this basename: WBC:2,HGB:2,HCT:2,PLT:2 in the last 72 hours BMET No results found for this basename: NA:2,K:2,CL:2,CO2:2,GLUCOSE:2,BUN:2,CREATININE:2,CALCIUM:2 in the last 72 hours PT/INR No results found for this basename: LABPROT:2,INR:2 in the last 72 hours  Studies/Results: No results found.  Anti-infectives: Anti-infectives    None      Assessment/Plan: continue wet to dry and abdominal binder * No surgery found *    LOS: 13 days    Matt B. Hassell Done, MD, [facility] [phone] beeper [phone]  04/28/2011 3:47 PM

## 2011-04-29 DIAGNOSIS — N179 Acute kidney failure, unspecified: Secondary | ICD-10-CM

## 2011-04-29 LAB — COMPREHENSIVE METABOLIC PANEL
ALT: 17 U/L (ref 0–35)
AST: 19 U/L (ref 0–37)
Alkaline Phosphatase: 136 U/L — ABNORMAL HIGH (ref 39–117)
CO2: 29 mEq/L (ref 19–32)
Calcium: 7.8 mg/dL — ABNORMAL LOW (ref 8.4–10.5)
GFR calc Af Amer: 83 mL/min — ABNORMAL LOW (ref >90)
GFR calc non Af Amer: 72 mL/min — ABNORMAL LOW (ref >90)
Glucose, Bld: 104 mg/dL — ABNORMAL HIGH (ref 70–99)
Potassium: 3 mEq/L — ABNORMAL LOW (ref 3.5–5.1)
Sodium: 135 mEq/L (ref 135–145)
Total Protein: 5.5 g/dL — ABNORMAL LOW (ref 6.0–8.3)

## 2011-04-29 LAB — CBC
HCT: 28.5 % — ABNORMAL LOW (ref 36.0–46.0)
Hemoglobin: 9.8 g/dL — ABNORMAL LOW (ref 12.0–15.0)
MCH: 32.7 pg (ref 26.0–34.0)
MCHC: 34.4 g/dL (ref 30.0–36.0)
RDW: 12.9 % (ref 11.5–15.5)

## 2011-04-29 MED ORDER — NON FORMULARY: Freq: Two times a day (BID) | Status: DC

## 2011-04-29 MED ORDER — ACETIC ACID 0.25 % IR SOLN
Freq: Three times a day (TID) | Status: DC
  Administered 2011-04-29 – 2011-05-01 (×3)
  Administered 2011-05-01: 1
  Administered 2011-05-02 (×2)
  Filled 2011-04-29 (×2): qty 1000

## 2011-04-29 MED ORDER — MAGNESIUM SULFATE 50 % IJ SOLN
2.0000 g | Freq: Once | INTRAVENOUS | Status: DC
  Filled 2011-04-29: qty 4

## 2011-04-29 MED ORDER — POTASSIUM CHLORIDE CRYS ER 20 MEQ PO TBCR: 40.0000 meq | EXTENDED_RELEASE_TABLET | Freq: Two times a day (BID) | ORAL | Status: DC

## 2011-04-29 MED ORDER — POTASSIUM CHLORIDE CRYS ER 20 MEQ PO TBCR
40.0000 meq | EXTENDED_RELEASE_TABLET | ORAL | Status: AC
  Administered 2011-04-29 (×2): 40 meq via ORAL
  Filled 2011-04-29 (×3): qty 2

## 2011-04-29 NOTE — Progress Notes (Signed)
Physical Therapy Treatment Patient Details Name: Teresa [name] MRN: [id number] DOB: 1940/08/22 Today's Date: 04/29/2011 Time: [phone] Charge: Elliot Cousin PT Assessment/Plan  PT - Assessment/Plan Comments on Treatment Session: Pt reports being up and ambulating in halls without supervision this weekend.  Discussed meeting goals with pt and D/C from acute PT, and pt agreeable.  Encouraged pt to continue ambulating with RW for comfort and have staff assist if needed.  Pt reports she does have a RW to use upon return home. PT Plan: All goals met and education completed, patient dischaged from PT services Follow Up Recommendations: Home health PT Equipment Recommended: None recommended by PT PT Goals  Acute Rehab PT Goals PT Goal: Supine/Side to Sit - Progress: Met PT Goal: Ambulate - Progress: Met  PT Treatment Precautions/Restrictions  Precautions Precautions: Other (comment) (No bending over so as to pur pressure on her abdomen) Required Braces or Orthoses: Yes Other Brace/Splint: abdominal binder Restrictions Weight Bearing Restrictions: No Mobility (including Balance) Bed Mobility Bed Mobility: Yes Rolling Left: 6: Modified independent (Device/Increase time) Left Sidelying to Sit: 6: Modified independent (Device/Increase time);HOB elevated (comment degrees) Left Sidelying to Sit Details (indicate cue type and reason): HOB 45* Sit to Supine - Left: 6: Modified independent (Device/Increase time);HOB elevated (comment degrees) Transfers Transfers: Yes Sit to Stand: 6: Modified independent (Device/Increase time);From bed;From chair/3-in-1 Stand to Sit: To bed;To chair/3-in-1;6: Modified independent (Device/Increase time) Ambulation/Gait Ambulation/Gait: Yes Ambulation/Gait Assistance: 6: Modified independent (Device/Increase time) Ambulation/Gait Assistance Details (indicate cue type and reason): Pt felt comfortable with RW and demonstrates safe, appropriate use Ambulation Distance  (Feet): 300 Feet Assistive device: Rolling walker Gait Pattern:  (minimal trunk flexion) Gait velocity: slow cadence    Exercise    End of Session PT - End of Session Activity Tolerance: Patient tolerated treatment well Patient left: in bed;with call bell in reach General Behavior During Session: [facility] for tasks performed Cognition: [facility] for tasks performed  [facility] 04/29/2011, 11:51 AM

## 2011-04-29 NOTE — Progress Notes (Signed)
Subjective: POD #14 SBO with exploratory lap.  Has allot of fascial necrosis, and malodorous.  Fascial sutures are loose.  Tolerating po's well. Objective: Vital signs in last 24 hours: Temp:  [98.1 F (36.7 C)-98.7 F (37.1 C)] 98.6 F (37 C) (11/13 0500) Pulse Rate:  [70-99] 86  (11/13 0856) Resp:  [18] 18  (11/13 0500) BP: (105-122)/(56-76) 111/72 mmHg (11/13 0856) SpO2:  [92 %-93 %] 92 % (11/13 0500) Weight:  [67.4 kg (148 lb 9.4 oz)] 148 lb 9.4 oz (67.4 kg) (11/13 0838) Last BM Date: 04/28/11  Intake/Output from previous day: 11/12 0701 - 11/13 0700 In: 560 [P.O.:560] Out: 200 [Urine:200] Intake/Output this shift:    General appearance: alert, cooperative, fatigued and no distress Resp: clear to auscultation bilaterally GI: soft, non-tender; bowel sounds normal; no masses,  no organomegaly and Open abdominal wound, Fascia is necrotic and malodorus. Sutures are all loose  Lab Results:  No results found for this basename: WBC:2,HGB:2,HCT:2,PLT:2 in the last 72 hours  BMET No results found for this basename: NA:2,K:2,CL:2,CO2:2,GLUCOSE:2,BUN:2,CREATININE:2,CALCIUM:2 in the last 72 hours PT/INR No results found for this basename: LABPROT:2,INR:2 in the last 72 hours   Studies/Results: No results found.  Anti-infectives: Anti-infectives    None     Current Facility-Administered Medications  Medication Dose Route Frequency Provider Last Rate Last Dose  . carvedilol (COREG) tablet 12.5 mg  12.5 mg Oral BID WC Darden Amber., MD   12.5 mg at 04/29/11 0856  . chlorhexidine (PERIDEX) 0.12 % solution 15 mL  15 mL Mouth/Throat BID Samul Dada, PHARMD   15 mL at 04/28/11 0806  . famotidine (PEPCID) tablet 20 mg  20 mg Oral BID Federico Flake, PA   20 mg at 04/29/11 0859  . feeding supplement (ENSURE) pudding 1 Container  1 Container Oral TID WC Judieth Keens, DO   1 Container at 04/26/11 1200  . furosemide (LASIX) tablet 40 mg  40 mg Oral Daily Blane Ohara, MD   40 mg at 04/29/11 0857  . heparin injection 5,000 Units  5,000 Units Subcutaneous [address] [place], [place]   5,000 Units at 04/29/11 [id number]  . LORazepam (ATIVAN) injection 0.5 mg  0.5 mg Intravenous Q6H PRN Brand Males, MD   0.5 mg at 04/28/11 2339  . losartan (COZAAR) tablet 25 mg  25 mg Oral Daily Blane Ohara, MD   25 mg at 04/29/11 0858  . naphazoline (NAPHCON) 0.1 % ophthalmic solution 1 drop  1 drop Both Eyes QID PRN Minda Ditto, PHARMD      . ondansetron (ZOFRAN) injection 4 mg  4 mg Intravenous Q6H PRN Gillermina Phy, PHARMD      . ondansetron [facility]) injection 4 mg  4 mg Intravenous Q4H PRN Sloan Fabio Asa, PHARMD      . oxyCODONE-acetaminophen (PERCOCET) 5-325 MG per tablet 1-2 tablet  1-2 tablet Oral Q4H PRN Odis Hollingshead, MD   2 tablet at 04/29/11 0732  . promethazine (PHENERGAN) injection 6.25 mg  6.25 mg Intravenous Q6H PRN Gillermina Phy, PHARMD        Assessment/Plan   POD#14 sbo, exploratory lap, with poor healing of abd fascia. 2.  Patient Active Problem List  Diagnoses  . Nonischemic cardiomyopathy  . HTN (hypertension)  . Tobacco abuse  . Chest pain  . SBO (small bowel obstruction), s/p expl lap  Plan:  NPO, check labs, will discuss with Dr. Hassell Done  LOS: 14 days    Earnstine Regal  04/29/2011  Will change to 0.5% acetic acid soaks to help control wound odor and healing.  Restart PO and try to get her home with home health care

## 2011-04-29 NOTE — Progress Notes (Signed)
Nutrition Follow Up Note   Diet: Regular, intake 50-80% of meals.   -POD# 14 SBO with exploratory laparotomy. Pt c/o changes in taste from medications, however reports she can taste things better today. Pt strongly not interested in nutritional supplements and stated that the Ensure pudding made her dry heave.   Scheduled Meds:   . acetic acid   Irrigation TID  . carvedilol  12.5 mg Oral BID WC  . chlorhexidine  15 mL Mouth/Throat BID  . famotidine  20 mg Oral BID  . furosemide  40 mg Oral Daily  . heparin subcutaneous  5,000 Units Subcutaneous Q8H  . losartan  25 mg Oral Daily  . magnesium sulfate infusion  2 g Intravenous Once  . potassium chloride  40 mEq Oral Q4H  . DISCONTD: feeding supplement  1 Container Oral TID WC  . DISCONTD: NON FORMULARY   Topical BID AC  . DISCONTD: potassium chloride  40 mEq Oral BID   Continuous Infusions:  PRN Meds:.LORazepam, naphazoline, ondansetron (ZOFRAN) IV, ondansetron (ZOFRAN) IV, oxyCODONE-acetaminophen, promethazine  CMP     Component Value Date/Time   NA 135 04/29/2011 1115   K 3.0* 04/29/2011 1115   CL 99 04/29/2011 1115   CO2 29 04/29/2011 1115   GLUCOSE 104* 04/29/2011 1115   BUN 14 04/29/2011 1115   CREATININE 0.81 04/29/2011 1115   CALCIUM 7.8* 04/29/2011 1115   PROT 5.5* 04/29/2011 1115   ALBUMIN 2.0* 04/29/2011 1115   AST 19 04/29/2011 1115   ALT 17 04/29/2011 1115   ALKPHOS 136* 04/29/2011 1115   BILITOT 0.3 04/29/2011 1115   GFRNONAA 72* 04/29/2011 1115   GFRAA 83* 04/29/2011 1115    Intake/Output Summary (Last 24 hours) at 04/29/11 1602 Last data filed at 04/29/11 1500  Gross per 24 hour  Intake    480 ml  Output      0 ml  Net    480 ml   Last BM 11/12 - loose per nursing  Nutrition diagnosis - Inadequate oral intake - resolving  Goal - Diet advancement - met, pt on regular diet  New goal - Pt to consistently consume >90% of meals.  Plan - Encouraged increased intake now that taste buds working better  today per pt report. No educational needs at this time. Ordered snacks per pt request. Will monitor.   Dietitian # [phone]

## 2011-04-29 NOTE — Plan of Care (Signed)
Problem: Inadequate Intake (NI-2.1) Goal: Food and/or nutrient delivery Individualized approach for food/nutrient provision.  Outcome: Progressing Pt eating 50-80% of meals on regular diet

## 2011-04-29 NOTE — Progress Notes (Signed)
Ur review completed 04/28/2011, Focus note and LOS completed 04/29/2011

## 2011-04-30 LAB — BASIC METABOLIC PANEL
CO2: 30 mEq/L (ref 19–32)
Calcium: 8.1 mg/dL — ABNORMAL LOW (ref 8.4–10.5)
Creatinine, Ser: 0.84 mg/dL (ref 0.50–1.10)
GFR calc non Af Amer: 69 mL/min — ABNORMAL LOW (ref >90)
Glucose, Bld: 101 mg/dL — ABNORMAL HIGH (ref 70–99)
Sodium: 132 mEq/L — ABNORMAL LOW (ref 135–145)

## 2011-04-30 MED ORDER — POTASSIUM CHLORIDE CRYS ER 20 MEQ PO TBCR
20.0000 meq | EXTENDED_RELEASE_TABLET | Freq: Two times a day (BID) | ORAL | Status: DC
  Administered 2011-04-30 – 2011-05-02 (×4): 20 meq via ORAL
  Filled 2011-04-30 (×5): qty 1

## 2011-04-30 NOTE — Progress Notes (Signed)
Mesh and exudate debrided at the bedside.  Wound looks better and will be able to be managed with home health care.

## 2011-04-30 NOTE — Progress Notes (Signed)
Subjective: No Change  Objective: Vital signs in last 24 hours: Temp:  [97.3 F (36.3 C)-98.6 F (37 C)] 98.6 F (37 C) (11/14 0520) Pulse Rate:  [73-79] 73  (11/14 0713) Resp:  [18] 18  (11/14 0520) BP: (108-135)/(71-80) 135/80 mmHg (11/14 0713) SpO2:  [96 %-98 %] 96 % (11/14 0520) Weight:  [68.6 kg (151 lb 3.8 oz)] 151 lb 3.8 oz (68.6 kg) (11/14 0713) Last BM Date: 04/30/11  Intake/Output from previous day: 11/13 0701 - 11/14 0700 In: 480 [P.O.:480] Out: 3 [Urine:1; Stool:2] Intake/Output this shift: Total I/O In: 240 [P.O.:240] Out: -   General appearance: alert, cooperative and no distress Resp: rales bibasilar and bilaterally GI: soft, non-tender; bowel sounds normal; no masses,  no organomegaly and Incision unchanged from yesterday.  Still malodorus, and fascia midline necrotic.  Lab Results:   [facility] 04/29/11 1115  WBC 12.2*  HGB 9.8*  HCT 28.5*  PLT 511*    BMET  Basename 04/30/11 0410 04/29/11 1115  NA 132* 135  K 3.3* 3.0*  CL 96 99  CO2 30 29  GLUCOSE 101* 104*  BUN 15 14  CREATININE 0.84 0.81  CALCIUM 8.1* 7.8*   PT/INR No results found for this basename: LABPROT:2,INR:2 in the last 72 hours   Studies/Results: No results found.  Anti-infectives: Anti-infectives    None     Current Facility-Administered Medications  Medication Dose Route Frequency Provider Last Rate Last Dose  . acetic acid 0.25 % irrigation   Irrigation TID Pedro Earls, MD      . carvedilol (COREG) tablet 12.5 mg  12.5 mg Oral BID WC Darden Amber., MD   12.5 mg at 04/30/11 0713  . chlorhexidine (PERIDEX) 0.12 % solution 15 mL  15 mL Mouth/Throat BID Samul Dada, PHARMD   15 mL at 04/28/11 0806  . famotidine (PEPCID) tablet 20 mg  20 mg Oral BID Federico Flake, PA   20 mg at 04/30/11 0810  . furosemide (LASIX) tablet 40 mg  40 mg Oral Daily Blane Ohara, MD   40 mg at 04/30/11 0811  . heparin injection 5,000 Units  5,000 Units Subcutaneous  Q8H Sloan Burch [place], PHARMD   5,000 Units at 04/30/11 1357  . LORazepam (ATIVAN) injection 0.5 mg  0.5 mg Intravenous Q6H PRN Brand Males, MD   0.5 mg at 04/29/11 2239  . losartan (COZAAR) tablet 25 mg  25 mg Oral Daily Blane Ohara, MD   25 mg at 04/30/11 [id number]  . magnesium sulfate 2 g in dextrose 5 % 250 mL infusion  2 g Intravenous Once Earnstine Regal, PA   2 g at 04/29/11 1400  . naphazoline (NAPHCON) 0.1 % ophthalmic solution 1 drop  1 drop Both Eyes QID PRN Minda Ditto, PHARMD      . ondansetron (ZOFRAN) injection 4 mg  4 mg Intravenous Q6H PRN Gillermina Phy, PHARMD      . ondansetron [facility]) injection 4 mg  4 mg Intravenous Q4H PRN Sloan Fabio Asa, PHARMD      . oxyCODONE-acetaminophen (PERCOCET) 5-325 MG per tablet 1-2 tablet  1-2 tablet Oral Q4H PRN Odis Hollingshead, MD   2 tablet at 04/30/11 1403  . potassium chloride SA (K-DUR,KLOR-CON) CR tablet 40 mEq  40 mEq Oral Q4H Earnstine Regal, [place]   40 mEq at 04/29/11 1737  . promethazine (PHENERGAN) injection 6.25 mg  6.25 mg Intravenous Q6H PRN Gillermina Phy, PHARMD      .  DISCONTD: feeding supplement (ENSURE) pudding 1 Container  1 Container Oral TID WC Judieth Keens, DO   1 Container at 04/26/11 1200    Assessment/Plan    POD # 15 s/p SBO, Exploratory laparotomy, with poor healing of abd. Fascia. Patient Active Problem List  Diagnoses  . Nonischemic cardiomyopathy  . HTN (hypertension)  . Tobacco abuse  . Chest pain  . SBO (small bowel obstruction), s/p expl lap  Plan: Continue wet to dry dressing with acetic acid solution, Encourage IS .  LOS: 15 days    JENNINGS,WILLARD 04/30/2011

## 2011-05-01 DIAGNOSIS — E039 Hypothyroidism, unspecified: Secondary | ICD-10-CM | POA: Diagnosis present

## 2011-05-01 DIAGNOSIS — M1990 Unspecified osteoarthritis, unspecified site: Secondary | ICD-10-CM | POA: Diagnosis present

## 2011-05-01 DIAGNOSIS — E785 Hyperlipidemia, unspecified: Secondary | ICD-10-CM | POA: Insufficient documentation

## 2011-05-01 DIAGNOSIS — K567 Ileus, unspecified: Secondary | ICD-10-CM | POA: Diagnosis not present

## 2011-05-01 DIAGNOSIS — N189 Chronic kidney disease, unspecified: Secondary | ICD-10-CM | POA: Diagnosis present

## 2011-05-01 DIAGNOSIS — I340 Nonrheumatic mitral (valve) insufficiency: Secondary | ICD-10-CM

## 2011-05-01 DIAGNOSIS — I429 Cardiomyopathy, unspecified: Secondary | ICD-10-CM | POA: Diagnosis present

## 2011-05-01 DIAGNOSIS — I071 Rheumatic tricuspid insufficiency: Secondary | ICD-10-CM | POA: Diagnosis present

## 2011-05-01 DIAGNOSIS — K219 Gastro-esophageal reflux disease without esophagitis: Secondary | ICD-10-CM | POA: Diagnosis present

## 2011-05-01 MED ORDER — LOSARTAN POTASSIUM 25 MG PO TABS: 25.0000 mg | ORAL_TABLET | Freq: Every day | ORAL | Status: DC

## 2011-05-01 MED ORDER — OXYCODONE-ACETAMINOPHEN 5-325 MG PO TABS: 1.0000 | ORAL_TABLET | ORAL | Status: AC | PRN

## 2011-05-01 MED ORDER — CARVEDILOL 12.5 MG PO TABS: 12.5000 mg | ORAL_TABLET | Freq: Two times a day (BID) | ORAL | Status: DC

## 2011-05-01 NOTE — Progress Notes (Signed)
CM spoke with pt.  See [facility] note in shadow chart.  Focus and LOS updated.

## 2011-05-01 NOTE — Progress Notes (Signed)
   *   No surgery found *  Subjective: Patient wants to go home.  Tomorrow is target.  Will need home health care with daily dressing changes.  Wound debrided last night and old polyester mesh removed.   Objective: Vital signs in last 24 hours: Temp:  [98.4 F (36.9 C)-98.8 F (37.1 C)] 98.7 F (37.1 C) (11/15 0500) Pulse Rate:  [61-97] 92  (11/15 0500) Resp:  [18] 18  (11/15 0500) BP: (126-138)/(64-73) 126/70 mmHg (11/15 0500) SpO2:  [94 %-99 %] 94 % (11/15 0500)   Intake/Output from previous day: 11/14 0701 - 11/15 0700 In: 360 [P.O.:360] Out: 1 [Urine:1] Intake/Output this shift:    Sutures and mesh debrided from open incision  Lab Results:   [facility] 04/29/11 1115  WBC 12.2*  HGB 9.8*  HCT 28.5*  PLT 511*   BMET  Basename 04/30/11 0410 04/29/11 1115  NA 132* 135  K 3.3* 3.0*  CL 96 99  CO2 30 29  GLUCOSE 101* 104*  BUN 15 14  CREATININE 0.84 0.81  CALCIUM 8.1* 7.8*   PT/INR No results found for this basename: LABPROT:2,INR:2 in the last 72 hours  Studies/Results: No results found.  Anti-infectives: Anti-infectives    None      Assessment/Plan: Plan for discharge tomorrow * No surgery found *    LOS: 16 days    Matt B. Hassell Done, MD, [facility] [phone] beeper [phone]  05/01/2011 7:23 AM

## 2011-05-02 ENCOUNTER — Telehealth (INDEPENDENT_AMBULATORY_CARE_PROVIDER_SITE_OTHER): Payer: Self-pay

## 2011-05-02 NOTE — Telephone Encounter (Signed)
Returned Sherrie Mustache (family member) phone call regarding FMLA forms, not able to leave a message.  Forms have been completed and faxed to [employer] [employer], forms to medical records to be scanned with confirmation of fax attached.

## 2011-05-02 NOTE — Discharge Summary (Signed)
Physician Discharge Summary  Patient ID: Teresa [name] MRN: [id number] DOB/AGE: [birth year] 70 y.o.  Admit date: 04/15/2011 Discharge date: 05/02/2011  Admission Diagnoses:  Discharge Diagnoses:  Active Problems:  SBO (small bowel obstruction), s/p expl lap  Ileus following gastrointestinal surgery  Hypothyroid  Dyslipidemia  GERD (gastroesophageal reflux disease)  Osteoarthritis  Cardiomyopathy  Mitral regurgitation  Tricuspid regurgitation  Chronic renal insufficiency   Discharged Condition: Improved  Hospital Course: The patient underwent laparotomy for bowel obstruction. The laparotomy incision went through some Parietex mesh that was placed previously by Dr. Rebekah Chesterfield.  This resulted in some fascial discoloration in the mesh separating at the edge. This was debrided in the open wound at a resulted after the exploratory  Laparotomy.  The wound is open and being packed twice daily and this will continue following discharge. Consults: none  Significant Diagnostic Studies: too numerous to mention  Treatments: surgery: See op note  Discharge Exam: Blood pressure 140/82, pulse 105, temperature 98.3 F (36.8 C), temperature source Oral, resp. rate 18, height 5\' 4"  (1.626 m), weight 147 lb 11.3 oz (67 kg), SpO2 95.00%. Wound has had the sutures removed and is packed open with gauze. This will be continued at home by home health care.  Disposition:  Home  Current Discharge Medication List    START taking these medications   Details  oxyCODONE-acetaminophen (PERCOCET) 5-325 MG per tablet Take 1-2 tablets by mouth every 4 (four) hours as needed. Qty: 40 tablet, Refills: 0      CONTINUE these medications which have CHANGED   Details  carvedilol (COREG) 12.5 MG tablet Take 1 tablet (12.5 mg total) by mouth 2 (two) times daily with a meal. Qty: 60 tablet, Refills: 0    losartan (COZAAR) 25 MG tablet Take 1 tablet (25 mg total) by mouth daily. Qty: 30 tablet, Refills: 0        CONTINUE these medications which have NOT CHANGED   Details  ALPRAZolam (NIRAVAM) 0.25 MG dissolvable tablet Take 0.25 mg by mouth at bedtime as needed. For anxiety    calcium carbonate (OS-CAL) 1250 MG chewable tablet Chew 1 tablet by mouth as needed. For heartburn     fish oil-omega-3 fatty acids 1000 MG capsule Take 2 g by mouth 2 (two) times daily.     furosemide (LASIX) 40 MG tablet 40 mg daily.     levothyroxine (SYNTHROID, LEVOTHROID) 88 MCG tablet Take 88 mcg by mouth daily.      naphazoline (CLEAR EYES) 0.012 % ophthalmic solution Place 1 drop into both eyes as needed. For blood shot eyes     ondansetron (ZOFRAN-ODT) 8 MG disintegrating tablet Take 8 mg by mouth every 8 (eight) hours as needed. nausea     simethicone (MYLICON) 80 MG chewable tablet Chew 80 mg by mouth as needed. Gas pain       STOP taking these medications     HYDROcodone-acetaminophen (VICODIN) 5-500 MG per tablet      spironolactone (ALDACTONE) 25 MG tablet        Follow-up Information    Follow up with LAYTON, [place], DO. Make an appointment in 1 week. (Try to get in next 7-10 days.  Call if you have problems with wound. as needed if symptoms worsen)    Contact information:   [address] [place] [phone]       Follow up with Minus Breeding, MD. Make an appointment in 2 weeks.   Contact information:  [address] [place], [place] [phone]       Follow up with Kennon Portela in 2 weeks. (As needed)    Contact information:   Urgent [facility] [place] [phone]          Signed: Pedro Earls 05/02/2011, 10:31 AM

## 2011-05-06 ENCOUNTER — Telehealth: Payer: Self-pay | Admitting: Cardiology

## 2011-05-06 NOTE — Telephone Encounter (Signed)
I do not want to restart meds not knowing what labs or BP is.  She needs to be seen before we restart these meds.  She needs to see somebody (primary care or surgeon or [place]) to evaluate the leg for possible DVT.

## 2011-05-06 NOTE — Telephone Encounter (Signed)
Per pt - had recent abdominal surgery and her medications were changed.  Her losartan was decreased to 25 mg a day, her carvedilol was decreased to 12.5 mg bid, spironolactone was discontinued but her furosemide remains the same at 40 mg a day.  Pt is having swelling in her left foot only.  She denies SOB.  She has not been able to weigh due to difficulty get up after her surgery.  She is watching the sodium levels in her diet.  She would like to know if she should increase any of her medications to what they were before or re-start the spironolactone.  Will forward to Dr Percival Spanish for review and orders.

## 2011-05-06 NOTE — Telephone Encounter (Signed)
Pt denies any redness, streaking or pain in foot or leg.  She will keep it elevated tonight and maintain a low sodium diet.  She will call her surgeon in the AM to see if she can be seen there for the foot swelling, possible DVT and to address the medication changes that were made peri-operatively.

## 2011-05-06 NOTE — Telephone Encounter (Signed)
Pt was in the hospital last week, and having swelling in left foot starting this am, meds were cut in half, what to do? # [phone]

## 2011-05-07 ENCOUNTER — Telehealth (INDEPENDENT_AMBULATORY_CARE_PROVIDER_SITE_OTHER): Payer: Self-pay | Admitting: General Surgery

## 2011-05-07 ENCOUNTER — Encounter (INDEPENDENT_AMBULATORY_CARE_PROVIDER_SITE_OTHER): Payer: Self-pay | Admitting: General Surgery

## 2011-05-07 NOTE — Telephone Encounter (Signed)
Per pt - had recent abdominal surgery and her medications were changed. Her losartan was decreased to 25 mg a day, her carvedilol was decreased to 12.5 mg bid, spironolactone was discontinued but her furosemide remains the same at 40 mg a day. Pt is having a small amount of swelling in both her feet. States better today than last night after elevation. She denies SOB.She would like to know if she should increase any of her medications to what they were before or re-start the spironolactone. She was informed to call Dr Lilyan Punt per Dr Hochrein's office. States the home health nurse come out and took vitals. Pulse- 78. Oxygen 96%. BP 128/60, today. Paged Dr Lilyan Punt.

## 2011-05-07 NOTE — Telephone Encounter (Signed)
Spoke with Dr Lilyan Punt who advised patient could continue on medications she was on prior to surgery. I advised patient and she will do this and call with any problems.

## 2011-05-09 ENCOUNTER — Telehealth (INDEPENDENT_AMBULATORY_CARE_PROVIDER_SITE_OTHER): Payer: Self-pay | Admitting: Surgery

## 2011-05-09 NOTE — Telephone Encounter (Signed)
Pt called for refill on hydrocodone.  ~ 2weeks post-op from exlap/LOA for SBO.  Has open wound w packing.  HAs 2-3 formed BM a day.  No N/V. No f/c/s.  Due to see [facility] this Monday.  Due to see PCP Dr. Carlota Raspberry soon.  I called in [place] #40 to [employer] [phone]

## 2011-05-12 ENCOUNTER — Ambulatory Visit (INDEPENDENT_AMBULATORY_CARE_PROVIDER_SITE_OTHER): Payer: Medicare Other | Admitting: General Surgery

## 2011-05-12 ENCOUNTER — Other Ambulatory Visit (INDEPENDENT_AMBULATORY_CARE_PROVIDER_SITE_OTHER): Payer: Self-pay | Admitting: General Surgery

## 2011-05-12 ENCOUNTER — Encounter (INDEPENDENT_AMBULATORY_CARE_PROVIDER_SITE_OTHER): Payer: Self-pay | Admitting: General Surgery

## 2011-05-12 VITALS — BP 140/70 | HR 72 | Temp 97.9°F | Resp 16 | Ht 63.0 in | Wt 140.0 lb

## 2011-05-12 DIAGNOSIS — Z4889 Encounter for other specified surgical aftercare: Secondary | ICD-10-CM

## 2011-05-12 DIAGNOSIS — Z5189 Encounter for other specified aftercare: Secondary | ICD-10-CM

## 2011-05-12 DIAGNOSIS — K912 Postsurgical malabsorption, not elsewhere classified: Secondary | ICD-10-CM

## 2011-05-12 NOTE — Progress Notes (Signed)
Subjective:     Patient ID: Teresa [name], female   DOB: 07/24/1940, 70 y.o.   MRN: [id number]  HPI This patient follows up explored her laparotomy and small bowel resection for small bowel obstruction performed on November 2.  EPIC was not working today so please see scanned note.Overall she's doing well and doing daily wound care for her midline wound. Her only complaint is swelling in her bilateral lower extremities and she is wondering if she can restart her home medications at her regular doses.  Review of Systems     Objective:   Physical Exam No distress and nontoxic-appearing  Her abdomen is healing well the necrotic tissues all gone now and she is left with normal, healthy appearing granulation tissue in the midline.She has some mild swelling of her lower extremities is equal bilaterally and she is nontender calves and no evidence of DVT.    Assessment:     Status post total laparotomy-doing well  Her incision is healing well and it seems like it has auto-debrided. I think with continued wound care this should heal up completely. She will likely have a ventral hernia and I discussed this with her we can fix this electively when her wound is completely healed. Otherwise we will plan to check some laboratory studies today and if her labs were normal she can resume her home medications as previously prescribed. Also recommended multivitamin and protein intake and nutrition to encourage wound healing.    Plan:     She will call back tomorrow for laboratory studies and if her labs are normal then she can restart her home medications and regular doses.

## 2011-05-13 LAB — COMPREHENSIVE METABOLIC PANEL
ALT: <8 U/L (ref 0–35)
Alkaline Phosphatase: 91 U/L (ref 39–117)
CO2: 30 mEq/L (ref 19–32)
Creat: 0.78 mg/dL (ref 0.50–1.10)
Sodium: 139 mEq/L (ref 135–145)
Total Bilirubin: 0.4 mg/dL (ref 0.3–1.2)
Total Protein: 5.7 g/dL — ABNORMAL LOW (ref 6.0–8.3)

## 2011-05-16 ENCOUNTER — Telehealth: Payer: Self-pay | Admitting: *Deleted

## 2011-05-16 ENCOUNTER — Telehealth (INDEPENDENT_AMBULATORY_CARE_PROVIDER_SITE_OTHER): Payer: Self-pay

## 2011-05-16 DIAGNOSIS — Z79899 Other long term (current) drug therapy: Secondary | ICD-10-CM

## 2011-05-16 DIAGNOSIS — I5022 Chronic systolic (congestive) heart failure: Secondary | ICD-10-CM

## 2011-05-16 MED ORDER — POTASSIUM CHLORIDE CRYS ER 20 MEQ PO TBCR: 20.0000 meq | EXTENDED_RELEASE_TABLET | Freq: Every day | ORAL | Status: DC

## 2011-05-16 NOTE — Telephone Encounter (Signed)
Dr. Lilyan Punt paged to discuss abnormal CMET lab results, per Dr. Lilyan Punt patient okay to start cardiac medication, patient need's to see PCP or Cardiologist to manage her Lasix and to address her low levels of potassium of 3.2.   I have faxed a copy of her recent (05/12/11)  lab's to Dr. Rosezella [place] nurse Jeannene Patella) at [facility] [phone]) for further potassium management.  Teresa [name] is having some mid-abdominal pain that she feels is related to her binder being too tight.  Pain seems to present itself during daytime hours.  Teresa [name] reports no fever, nausea or vomiting, normal BM's during the day and seem's to have loose BM's during nighttime hours.  She states that her abdominal pain decreases when she loosens her abdominal binder.  Patient also reports that home health nurse feel's her wound is decreasing in size and is healing well.  Per Dr. Lilyan Punt she can adjust abdominal binder to a more comfortable fit or she can discontinue wearing it as well.  Patient is aware to call our office if she develops fever, increase in pain or drainage from abdominal wound, nausea or vomiting develops.

## 2011-05-16 NOTE — Telephone Encounter (Signed)
Received lab results form [facility] that were drawn on 05/12/11.  [name] potassium level was 3.2.  She is not on any supplementation.  Reviewed with Dr Aundra Dubin who gave orders for pt to start potassium chloride 20 mEq qd and eat bananas.  She is to have lab work repeated on Monday.  Pt states understanding.  An order will be sent to [facility], per pt request, electronically for the RX.

## 2011-05-26 ENCOUNTER — Telehealth (INDEPENDENT_AMBULATORY_CARE_PROVIDER_SITE_OTHER): Payer: Self-pay | Admitting: General Surgery

## 2011-05-26 NOTE — Telephone Encounter (Signed)
Called Teresa [name] at [facility] to let her know Dr. Lilyan Punt had approved order to change from bid dressing changes to qd with Silvadene.  Pt will follow up with Dr. Lilyan Punt on 06/06/11.

## 2011-05-26 NOTE — Telephone Encounter (Signed)
Stephanie at [employer] called stated she wanted to know if orders can be changed. Pt currently has wet to dry changes twice per day. Colletta Maryland wants to know if can be changed to silvadene once a day instead. Currently a family member is providing the dressing changes and it is "too much for them".  Colletta Maryland said patient also having increased pain with binder. Colletta Maryland requested call back on [phone] to advise.

## 2011-05-27 ENCOUNTER — Ambulatory Visit (INDEPENDENT_AMBULATORY_CARE_PROVIDER_SITE_OTHER): Payer: Medicare Other

## 2011-05-27 ENCOUNTER — Emergency Department (HOSPITAL_COMMUNITY): Payer: Medicare Other

## 2011-05-27 ENCOUNTER — Other Ambulatory Visit: Payer: Self-pay

## 2011-05-27 ENCOUNTER — Encounter (HOSPITAL_COMMUNITY): Payer: Self-pay | Admitting: *Deleted

## 2011-05-27 ENCOUNTER — Emergency Department (HOSPITAL_COMMUNITY)
Admission: EM | Admit: 2011-05-27 | Discharge: 2011-05-27 | Disposition: A | Discharge status: Home / Self Care | Payer: Medicare Other | Attending: Emergency Medicine | Admitting: Emergency Medicine

## 2011-05-27 DIAGNOSIS — G8918 Other acute postprocedural pain: Secondary | ICD-10-CM

## 2011-05-27 DIAGNOSIS — R1013 Epigastric pain: Secondary | ICD-10-CM

## 2011-05-27 DIAGNOSIS — I10 Essential (primary) hypertension: Secondary | ICD-10-CM | POA: Insufficient documentation

## 2011-05-27 DIAGNOSIS — R109 Unspecified abdominal pain: Secondary | ICD-10-CM | POA: Insufficient documentation

## 2011-05-27 DIAGNOSIS — E119 Type 2 diabetes mellitus without complications: Secondary | ICD-10-CM | POA: Insufficient documentation

## 2011-05-27 DIAGNOSIS — R609 Edema, unspecified: Secondary | ICD-10-CM | POA: Insufficient documentation

## 2011-05-27 DIAGNOSIS — N390 Urinary tract infection, site not specified: Secondary | ICD-10-CM

## 2011-05-27 DIAGNOSIS — R197 Diarrhea, unspecified: Secondary | ICD-10-CM | POA: Insufficient documentation

## 2011-05-27 DIAGNOSIS — Z9889 Other specified postprocedural states: Secondary | ICD-10-CM | POA: Insufficient documentation

## 2011-05-27 DIAGNOSIS — F17200 Nicotine dependence, unspecified, uncomplicated: Secondary | ICD-10-CM | POA: Insufficient documentation

## 2011-05-27 DIAGNOSIS — I428 Other cardiomyopathies: Secondary | ICD-10-CM | POA: Insufficient documentation

## 2011-05-27 DIAGNOSIS — I509 Heart failure, unspecified: Secondary | ICD-10-CM | POA: Insufficient documentation

## 2011-05-27 LAB — CBC
HCT: 31.6 % — ABNORMAL LOW (ref 36.0–46.0)
MCH: 30.3 pg (ref 26.0–34.0)
MCV: 91.3 fL (ref 78.0–100.0)
Platelets: 593 10*3/uL — ABNORMAL HIGH (ref 150–400)
RDW: 13 % (ref 11.5–15.5)
WBC: 13.7 10*3/uL — ABNORMAL HIGH (ref 4.0–10.5)

## 2011-05-27 LAB — COMPREHENSIVE METABOLIC PANEL
CO2: 24 mEq/L (ref 19–32)
Calcium: 9.9 mg/dL (ref 8.4–10.5)
Creatinine, Ser: 0.81 mg/dL (ref 0.50–1.10)
GFR calc Af Amer: 83 mL/min — ABNORMAL LOW (ref >90)
GFR calc non Af Amer: 72 mL/min — ABNORMAL LOW (ref >90)
Glucose, Bld: 96 mg/dL (ref 70–99)
Sodium: 128 mEq/L — ABNORMAL LOW (ref 135–145)
Total Protein: 8.1 g/dL (ref 6.0–8.3)

## 2011-05-27 LAB — DIFFERENTIAL
Basophils Absolute: 0.1 10*3/uL (ref 0.0–0.1)
Eosinophils Absolute: 0.1 10*3/uL (ref 0.0–0.7)
Eosinophils Relative: 1 % (ref 0–5)
Lymphocytes Relative: 18 % (ref 12–46)
Lymphs Abs: 2.4 10*3/uL (ref 0.7–4.0)
Monocytes Absolute: 0.8 10*3/uL (ref 0.1–1.0)

## 2011-05-27 LAB — APTT: aPTT: 41 s — ABNORMAL HIGH (ref 24–37)

## 2011-05-27 LAB — URINALYSIS, ROUTINE W REFLEX MICROSCOPIC
Nitrite: NEGATIVE
Protein, ur: NEGATIVE mg/dL
Specific Gravity, Urine: 1.02 (ref 1.005–1.030)
Urobilinogen, UA: 0.2 mg/dL (ref 0.0–1.0)

## 2011-05-27 LAB — TROPONIN I: Troponin I: <0.3 ng/mL (ref <0.30)

## 2011-05-27 LAB — PROTIME-INR: Prothrombin Time: 13.9 seconds (ref 11.6–15.2)

## 2011-05-27 LAB — LACTIC ACID, PLASMA: Lactic Acid, Venous: 1.1 mmol/L (ref 0.5–2.2)

## 2011-05-27 LAB — URINE MICROSCOPIC-ADD ON

## 2011-05-27 LAB — GLUCOSE, CAPILLARY: Glucose-Capillary: 99 mg/dL (ref 70–99)

## 2011-05-27 MED ORDER — ONDANSETRON HCL 4 MG/2ML IJ SOLN
4.0000 mg | Freq: Once | INTRAMUSCULAR | Status: AC
  Administered 2011-05-27: 4 mg via INTRAVENOUS
  Filled 2011-05-27: qty 2

## 2011-05-27 MED ORDER — IOHEXOL 300 MG/ML  SOLN
80.0000 mL | Freq: Once | INTRAMUSCULAR | Status: AC | PRN
  Administered 2011-05-27: 80 mL via INTRAVENOUS

## 2011-05-27 MED ORDER — HYDROMORPHONE HCL PF 1 MG/ML IJ SOLN
1.0000 mg | Freq: Once | INTRAMUSCULAR | Status: AC
  Administered 2011-05-27: 1 mg via INTRAVENOUS
  Filled 2011-05-27: qty 1

## 2011-05-27 MED ORDER — HYDROMORPHONE HCL PF 1 MG/ML IJ SOLN
0.5000 mg | Freq: Once | INTRAMUSCULAR | Status: AC
  Administered 2011-05-27: 0.5 mg via INTRAVENOUS
  Filled 2011-05-27: qty 1

## 2011-05-27 MED ORDER — SULFAMETHOXAZOLE-TRIMETHOPRIM 800-160 MG PO TABS: 1.0000 | ORAL_TABLET | Freq: Two times a day (BID) | ORAL | Status: AC

## 2011-05-27 MED ORDER — SODIUM CHLORIDE 0.9 % IV SOLN
INTRAVENOUS | Status: DC
  Administered 2011-05-27: 19:00:00 via INTRAVENOUS

## 2011-05-27 MED ORDER — OXYCODONE-ACETAMINOPHEN 5-325 MG PO TABS: 1.0000 | ORAL_TABLET | ORAL | Status: AC | PRN

## 2011-05-27 NOTE — ED Provider Notes (Addendum)
History     CSN: [id number] Arrival date & time: 05/27/2011  2:26 PM   First MD Initiated Contact with Patient 05/27/11 1714      Chief Complaint  Patient presents with  . Abdominal Pain    (Consider location/radiation/quality/duration/timing/severity/associated sxs/prior treatment) HPI Comments: Is a 70 year old female with a history of congestive heart failure and hypertension as well as a recent history of bowel obstruction that was operated on on 04/18/2011 by Dr. Lilyan Punt, with an uncomplicated postoperative course other than a laparotomy incision that is healing by secondary intention. The patient is changing the dressings on it regularly and it appears to be healthy granulation tissue there. The patient reports that she had had intermittent abdominal pain of similar character to what she is presenting for today ever since her laparotomy, but it had been intermittent. She reports that she had several episodes of loose nonbloody stool last night before midnight, and after midnight developed severe unrelenting and persistent pain, aching in character at the upper abdomen, without radiation, without shortness of breath or palpitations, and without nausea or vomiting. She has not had a bowel movement since the pain began. She has also not vomited since the pain began. She had presented to her primary care physician at [facility] urgent care this afternoon for evaluation. An acute abdominal series x-ray was performed to evaluate and that was sent with the patient on CDE, but I am unable to open it on any of our computers to review the study. Per the note in the clinic, the pulmonary urgent care doctors had spoken with [facility] to let them know they were concerned that the patient may have a recurrent small bowel obstruction and was being sent to [facility] on emergency department for evaluation. I have spoken with Dr. Dalbert Batman of [facility] surgery and apprised him of the patient's presence  in the ED, her history, her presentation including physical examination, and symptoms, and he has advised me to proceed with a workup to evaluate for bowel obstruction as well as other causes such as cholecystitis, and to order a CT scan and to call him back after those results to discuss the patient further.  The history is provided by the patient and medical records.    Past Medical History  Diagnosis Date  . Nonischemic cardiomyopathy     EF is 25% per echo February 2012, non ischemic myoview 12/2009.  EF 50% echo 7/12  . HTN (hypertension)   . PVC (premature ventricular contraction)   . Tobacco abuse   . Heart palpitations   . Valvular heart disease     Moderate to severe MR, moderate TR, LAE per echo 7/11  . Glaucoma   . Glaucoma     history of  . Spinal stenosis   . Abdominal pain   . Hernia   . Arthritis   . Menopause   . Diabetes mellitus     type 2    Past Surgical History  Procedure Date  . Knee arthroscopy   . Tubal ligation   . Glaucoma surgery   . Abdominal hernia repair   . Right knee surgery   . Thyroidectomy   . Eye surgery     glaucoma  . Laparotomy 2012    Family History  Problem Relation Age of Onset  . Stroke Brother   . Stroke Sister   . Heart disease Sister     History  Substance Use Topics  . Smoking status: Never Smoker   .  Smokeless tobacco: Never Used  . Alcohol Use: No    OB History    Grav Para Term Preterm Abortions TAB SAB Ect Mult Living                  Review of Systems  Constitutional: Positive for appetite change. Negative for fever, chills, diaphoresis, activity change and fatigue.  HENT: Negative for congestion, sore throat, rhinorrhea and trouble swallowing.   Eyes: Negative.   Respiratory: Negative for cough, chest tightness, shortness of breath and wheezing.   Cardiovascular: Positive for leg swelling. Negative for chest pain and palpitations.  Gastrointestinal: Positive for abdominal pain and diarrhea. Negative  for nausea, vomiting, constipation, blood in stool, abdominal distention, anal bleeding and rectal pain.  Genitourinary: Negative for dysuria, frequency, flank pain, decreased urine volume, difficulty urinating and pelvic pain.  Musculoskeletal: Negative for myalgias and back pain.  Skin: Positive for wound. Negative for color change, pallor and rash.       Laparotomy incision at the abdomen healing by secondary intention  Neurological: Negative for dizziness, tremors, speech difficulty, weakness, light-headedness and headaches.  Hematological: Does not bruise/bleed easily.  Psychiatric/Behavioral: Negative.     Allergies  Aspirin  Home Medications   Current Outpatient Rx  Name Route Sig Dispense Refill  . ALPRAZOLAM 0.25 MG PO TBDP Oral Take 0.25 mg by mouth at bedtime as needed. For anxiety    . CALCIUM CARBONATE 1250 MG PO CHEW Oral Chew 1 tablet by mouth as needed. For heartburn     . CARVEDILOL 12.5 MG PO TABS Oral Take 1 tablet (12.5 mg total) by mouth 2 (two) times daily with a meal. 60 tablet 0  . OMEGA-3 FATTY ACIDS 1000 MG PO CAPS Oral Take 2 g by mouth 2 (two) times daily.     . FUROSEMIDE 40 MG PO TABS  40 mg daily.     Marland Kitchen LEVOTHYROXINE SODIUM 88 MCG PO TABS Oral Take 88 mcg by mouth daily.      Marland Kitchen LOSARTAN POTASSIUM 25 MG PO TABS Oral Take 1 tablet (25 mg total) by mouth daily. 30 tablet 0  . NAPHAZOLINE HCL 0.012 % OP SOLN Both Eyes Place 1 drop into both eyes as needed. For blood shot eyes     . ONDANSETRON 8 MG PO TBDP Oral Take 8 mg by mouth every 8 (eight) hours as needed. nausea     . POTASSIUM CHLORIDE CRYS CR 20 MEQ PO TBCR Oral Take 1 tablet (20 mEq total) by mouth daily. 30 tablet 6  . SIMETHICONE 80 MG PO CHEW Oral Chew 80 mg by mouth as needed. Gas pain       BP 136/67  Pulse 89  Temp(Src) 97.5 F (36.4 C) (Axillary)  Resp 17  SpO2 97%  Physical Exam  Nursing note and vitals reviewed. Constitutional: She is oriented to person, place, and time. She appears  well-developed and well-nourished. She appears distressed.  HENT:  Head: Normocephalic and atraumatic.  Nose: Nose normal.  Mouth/Throat: Oropharynx is clear and moist. No oropharyngeal exudate.  Eyes: Conjunctivae and EOM are normal. Pupils are equal, round, and reactive to light. Right eye exhibits no discharge. Left eye exhibits no discharge. No scleral icterus.  Neck: Normal range of motion. Neck supple. No JVD present. No tracheal deviation present.  Cardiovascular: Normal rate, regular rhythm, normal heart sounds and intact distal pulses.  Exam reveals no gallop and no friction rub.   No murmur heard. Pulmonary/Chest: Effort normal and breath sounds  normal. No stridor. No respiratory distress. She has no wheezes. She has no rales. She exhibits no tenderness.  Abdominal: Soft. Bowel sounds are normal. She exhibits no shifting dullness, no distension, no pulsatile liver, no fluid wave, no abdominal bruit, no ascites, no pulsatile midline mass and no mass. There is no hepatosplenomegaly. There is tenderness in the epigastric area. There is no rigidity, no rebound, no guarding, no CVA tenderness, no tenderness at McBurney's point and negative Murphy's sign. No hernia.    Musculoskeletal: Normal range of motion. She exhibits edema. She exhibits no tenderness.  Neurological: She is alert and oriented to person, place, and time. She has normal reflexes. No cranial nerve deficit. She exhibits normal muscle tone. Coordination normal.  Skin: Skin is warm and dry. No rash noted. She is not diaphoretic. No erythema. No pallor.  Psychiatric: She has a normal mood and affect. Her behavior is normal.    ED Course  Procedures (including critical care time)   Date: 05/27/2011  Rate: 83  Rhythm: normal sinus rhythm  QRS Axis: left  Intervals: QT prolonged  ST/T Wave abnormalities: nonspecific ST/T changes  Conduction Disutrbances:none  Narrative Interpretation: Non-provocative EKG  Old EKG  Reviewed: The patient has no new EKG changes when comparing her current EKG to prior EKG. She redemonstrates mild anterolateral ST depression and nonspecific T-wave inversions     Labs Reviewed  CBC  DIFFERENTIAL  COMPREHENSIVE METABOLIC PANEL  LIPASE, BLOOD  URINALYSIS, ROUTINE W REFLEX MICROSCOPIC  LACTIC ACID, PLASMA  PROTIME-INR  APTT  TROPONIN I   No results found.   No diagnosis found.  6:29 PM Spoke with Dr. Dalbert Batman of [facility] surgery as mentioned in the history of present illness. We agreed that I will call him back after the workup is complete to discuss further management and any role he needs to play in being involved in the patient's ED case and course.  9:06 PM I spoke with Dr. Dalbert Batman of [facility] surgery again after completing the diagnostic workup for the patient. The CT scan shows no bowel obstruction or intra-abdominal infection, and her lab work up is non-provocative other than a mild leukocytosis and possibly a white urinary tract infection. The patient's pain is significantly improved and she is in no apparent distress awake, alert, and oriented at this time. I discussed these results as well as the patient's current status with Dr. Dalbert Batman and at this time he advises for outpatient followup with [facility] surgery, pain control as an outpatient, and treatment of the urinary tract infection as I had planned. He does not feel that he needs to evaluate the patient at this time in the ED and I to have no compelling reason to request his evaluation in person. I've inform the patient and her family of the results and the plan of care and they state their understanding of and agreement with them. MDM  Small bowel obstruction, intra-abdominal abscess or infection, ileus, peritonitis, cholecystitis, pancreatitis, gastritis, enteritis, urinary tract infection, myocardial infarction, postoperative pain are all entertained in the patient's differential  diagnosis amongst other potential etiologies.        Charlena Cross, MD 05/27/11 1830  Charlena Cross, MD 05/27/11 Valerie Roys  Charlena Cross, MD 05/27/11 2108

## 2011-05-27 NOTE — ED Notes (Signed)
Pt sent from [facility] urgent care for CT scan for possible blockage. Pt c/o epigastric and RUQ pain. Had surgery for bowel obstruction Nov 5. Has dressing present to lower abd from that. Denies n/v.

## 2011-05-29 ENCOUNTER — Telehealth (INDEPENDENT_AMBULATORY_CARE_PROVIDER_SITE_OTHER): Payer: Self-pay | Admitting: General Surgery

## 2011-05-29 ENCOUNTER — Telehealth (INDEPENDENT_AMBULATORY_CARE_PROVIDER_SITE_OTHER): Payer: Self-pay

## 2011-05-29 LAB — URINE CULTURE: Colony Count: >=100000

## 2011-05-29 NOTE — Telephone Encounter (Signed)
Colletta Maryland for [facility] called and wanted to know about pt having a wound vac placed. I paged Dr Lilyan Punt and he stated that he will going about pt having a wound vac when pt come in clinic on 06-06-11. Colletta Maryland call back number is [phone]

## 2011-05-29 NOTE — Telephone Encounter (Signed)
Patient called stating she's having RUQ pain, she states her pain medication wears off and she is taking it every 4 hours, she states she only has 7 left and would like a refill or possible something that last longer.

## 2011-06-06 ENCOUNTER — Encounter (INDEPENDENT_AMBULATORY_CARE_PROVIDER_SITE_OTHER): Payer: Self-pay | Admitting: General Surgery

## 2011-06-06 ENCOUNTER — Ambulatory Visit (INDEPENDENT_AMBULATORY_CARE_PROVIDER_SITE_OTHER): Payer: Medicare Other | Admitting: General Surgery

## 2011-06-06 VITALS — BP 122/66 | HR 68 | Temp 97.2°F | Resp 14 | Ht 63.0 in | Wt 133.8 lb

## 2011-06-06 DIAGNOSIS — Z4889 Encounter for other specified surgical aftercare: Secondary | ICD-10-CM

## 2011-06-06 DIAGNOSIS — Z5189 Encounter for other specified aftercare: Secondary | ICD-10-CM

## 2011-06-06 MED ORDER — HYDROCODONE-ACETAMINOPHEN 5-500 MG PO TABS: 1.0000 | ORAL_TABLET | Freq: Four times a day (QID) | ORAL | Status: DC | PRN

## 2011-06-06 NOTE — Progress Notes (Signed)
Subjective:     Patient ID: Teresa [name], female   DOB: [birth year], 70 y.o.   MRN: [id number]  HPI She follows up today for wound evaluation. She has been doing dressing changes twice daily. She's been taking the Nexium and 2 days after starting the Nexium her previous right upper quadrant abdominal pain has stopped. She feels well otherwise.  Review of Systems     Objective:   Physical Exam No acute distress and nontoxic-appearing Her abdominal wound is healing well without sign of infection. She has healthy granulation tissue at the base of the wound and her wound is much more superficial. She still has some exposed mesh from her prior hernia operation but this is well incorporated into the granulation tissue.    Assessment:     Status post  exploratory laparotomy with lysis of adhesions and small bowel resection complicated with fascial necrosis. Her wound is healing well and she looks well overall her abdominal pain has improved. Continue Nexium and current wound care    Plan:     She will follow up in 3 weeks for evaluation and for that she could have the wound VAC applied if she wants or she can continue current wound care. She will continue with current dressing changes and she will followup in a couple weeks for reevaluation. I. recommended multivitamin and increased protein intake with some protein supplements for approximately 50 g of protein daily.

## 2011-06-06 NOTE — Patient Instructions (Signed)
May change dressing once daily as long as the dressings continue to be clean and not "soupy" Take multivitamin Increase protein intake.

## 2011-06-09 ENCOUNTER — Other Ambulatory Visit: Payer: Self-pay | Admitting: Cardiology

## 2011-06-11 ENCOUNTER — Other Ambulatory Visit: Payer: Self-pay | Admitting: Cardiology

## 2011-06-25 ENCOUNTER — Telehealth (INDEPENDENT_AMBULATORY_CARE_PROVIDER_SITE_OTHER): Payer: Self-pay

## 2011-06-25 NOTE — Telephone Encounter (Signed)
Teresa [name] w/[facility] called she would like to apply Silver Alginate to Teresa [name]'s abdominal wound, she states the wound continues to heal, no infection present.  Dr. Lilyan Punt approved Silver Alginate for wound care, Teresa [name] has a follow up appt w/Dr. Lilyan Punt on 07/01/11.

## 2011-06-26 ENCOUNTER — Other Ambulatory Visit: Payer: Self-pay | Admitting: Cardiology

## 2011-07-01 ENCOUNTER — Ambulatory Visit (INDEPENDENT_AMBULATORY_CARE_PROVIDER_SITE_OTHER): Payer: Medicare Other | Admitting: General Surgery

## 2011-07-01 ENCOUNTER — Encounter (INDEPENDENT_AMBULATORY_CARE_PROVIDER_SITE_OTHER): Payer: Self-pay | Admitting: General Surgery

## 2011-07-01 VITALS — BP 118/64 | HR 68 | Temp 98.2°F | Resp 18 | Ht 63.0 in | Wt 138.8 lb

## 2011-07-01 DIAGNOSIS — S31109A Unspecified open wound of abdominal wall, unspecified quadrant without penetration into peritoneal cavity, initial encounter: Secondary | ICD-10-CM

## 2011-07-01 NOTE — Progress Notes (Signed)
Subjective:     Patient ID: Teresa [name], female   DOB: [birth year], 71 y.o.   MRN: [id number]  HPI She follows up today for evaluation of her abdominal wound. The ulna process changed her to a silver alginate dressings and every other day dressings. She complained today of foul-smelling wound. Otherwise her abdominal pain has resolved since being on the Nexium. She is back to work.  Review of Systems     Objective:   Physical Exam Her wound is healing well without sign of infection she does have some foul-smelling fibrinous exudate in the wound bed but she still has healthy-appearing granulation tissue. I packed the wound with wet to dry dressings. She did have a slight appearance of a fungal rash on the adjacent skin. Overall the wound appears to be healing well.    Assessment:     Open abdominal wound This appears to be healing well without sign of infection although the silver alginate and is foul smelling and so we will plan on going back to b.i.d. wet to dry dressings. After a week or so if the wound is cleaned up, we will plan on letting down some Acell to see if this will epithelialize sooner.    Plan:     B.i.d. wet to dry dressing changes and follow up in one week.

## 2011-07-10 ENCOUNTER — Encounter (INDEPENDENT_AMBULATORY_CARE_PROVIDER_SITE_OTHER): Payer: Medicare Other | Admitting: General Surgery

## 2011-07-11 ENCOUNTER — Encounter (INDEPENDENT_AMBULATORY_CARE_PROVIDER_SITE_OTHER): Payer: Self-pay | Admitting: General Surgery

## 2011-07-11 ENCOUNTER — Ambulatory Visit (INDEPENDENT_AMBULATORY_CARE_PROVIDER_SITE_OTHER): Payer: Medicare Other | Admitting: General Surgery

## 2011-07-11 VITALS — BP 118/84 | HR 72 | Temp 98.4°F | Resp 16 | Ht 63.0 in | Wt 138.6 lb

## 2011-07-11 DIAGNOSIS — T8130XA Disruption of wound, unspecified, initial encounter: Secondary | ICD-10-CM

## 2011-07-11 NOTE — Progress Notes (Signed)
Subjective:     Patient ID: Teresa [name], female   DOB: 05-06-1941, 71 y.o.   MRN: [id number]  HPI She follows up today for repeat wound check and application of Acell. She is feeling well and her wound is no longer spell he says she's been doing wet to dry dressings.  Review of Systems     Objective:   Physical Exam She has healthy-appearing granulation tissue in the midline wound she has a cratering around the edges and a small portion of her previous mesh exposed along the right lateral edge which appears to be pretty well incorporated with granulation tissue. There is no sign of infection.    Assessment:     Status post exporter laparotomy with small bowel resection now with fascial dehiscence and chronic wound. A cell dressing was applied using 3 vials of powder and one sheet of thin a cell. Vaseline gauze was applied with some sterile K-Y jelly and sterile dressing. Wound care structures were given to the patient and she will follow up in one week for repeat evaluation and repeat application.    Plan:     F/u 1 week

## 2011-07-11 NOTE — Patient Instructions (Signed)
Follow up 7 days Dr. Lilyan Punt Change OUTER dressing every other day. Remove gauze but leave vaseline coated gauze layer intact (do not remove) Apply small amount of saline solution. Apply thin layer of KY jelly or vaseline. Then apply moist saline gauze. Cover with dry gauze and tape.

## 2011-07-18 ENCOUNTER — Ambulatory Visit (INDEPENDENT_AMBULATORY_CARE_PROVIDER_SITE_OTHER): Payer: Medicare Other | Admitting: General Surgery

## 2011-07-18 VITALS — BP 126/84 | HR 72 | Temp 97.3°F | Resp 16 | Ht 63.0 in | Wt 143.6 lb

## 2011-07-18 DIAGNOSIS — Z4889 Encounter for other specified surgical aftercare: Secondary | ICD-10-CM

## 2011-07-18 DIAGNOSIS — Z5189 Encounter for other specified aftercare: Secondary | ICD-10-CM

## 2011-07-18 NOTE — Progress Notes (Signed)
Subjective:     Patient ID: Teresa [name], female   DOB: [birth year], 71 y.o.   MRN: [id number]  HPI This patient follows up for recheck of her abdominal wound. Last week we applied Acell graft and she complains of the smell this week. Otherwise no complaints.  Review of Systems     Objective:   Physical Exam The wound is healing well without sign of infection there is no residual Acell. She still has some cratering around the edges of the wound and it has not healed it is nice as I would have liked. But there is no sign of infection.I reapplied a thicker sheet of Acell and the wound was dressed again.    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 Wound care dressing instructions were given. Otherwise the wound looks fine and without infection.    Plan:     Follow up 1 week

## 2011-07-23 ENCOUNTER — Telehealth: Payer: Self-pay | Admitting: Physician Assistant

## 2011-07-23 MED ORDER — ALPRAZOLAM 0.25 MG PO TABS: ORAL_TABLET | ORAL | Status: DC

## 2011-07-23 NOTE — Telephone Encounter (Signed)
Ok to rf xanax x 1 Epiphany Seltzer

## 2011-07-25 ENCOUNTER — Encounter (INDEPENDENT_AMBULATORY_CARE_PROVIDER_SITE_OTHER): Payer: Self-pay | Admitting: General Surgery

## 2011-07-25 ENCOUNTER — Ambulatory Visit (INDEPENDENT_AMBULATORY_CARE_PROVIDER_SITE_OTHER): Payer: Medicare Other | Admitting: General Surgery

## 2011-07-25 VITALS — BP 142/98 | HR 70 | Temp 97.3°F | Resp 16 | Ht 63.0 in | Wt 142.2 lb

## 2011-07-25 DIAGNOSIS — T8130XA Disruption of wound, unspecified, initial encounter: Secondary | ICD-10-CM

## 2011-07-25 NOTE — Progress Notes (Signed)
Subjective:     Patient ID: Teresa [name], female   DOB: 01-Mar-1941, 71 y.o.   MRN: [id number]  HPI She is here today for repeat application of Acell. No new issues.  Review of Systems     Objective:   Physical Exam The wound appears minimally changed. She still has some cratering around the left lateral aspect of the wound. I did trim up some of the exposed mesh on the right side of the wound and we applied some Acell powder, and applied a rolled up sheet around the edges to fill in the greater and we applied a medium sheet across the center portion of the wound.    Assessment:     Chronic abdominal wound She's making slow progress the wound. Have not seen much improvement in healing in the defect the wound still appeared clean and without any sign of infection.    Plan:     She will continue wound care as we have done for the last 2 weeks. We'll see her back in one week.

## 2011-08-01 ENCOUNTER — Telehealth (INDEPENDENT_AMBULATORY_CARE_PROVIDER_SITE_OTHER): Payer: Self-pay

## 2011-08-01 ENCOUNTER — Ambulatory Visit (INDEPENDENT_AMBULATORY_CARE_PROVIDER_SITE_OTHER): Payer: Medicare Other | Admitting: General Surgery

## 2011-08-01 VITALS — BP 136/82 | Temp 97.4°F | Resp 16 | Wt 142.8 lb

## 2011-08-01 DIAGNOSIS — T8130XA Disruption of wound, unspecified, initial encounter: Secondary | ICD-10-CM

## 2011-08-01 NOTE — Telephone Encounter (Signed)
Called Teresa [name] to confirm dates she would like surgery on, Dr. Lilyan Punt was given the dates (3/29-09/21/11) Prefers 09/10/11 or 09/11/11

## 2011-08-01 NOTE — Progress Notes (Signed)
Subjective:     Patient ID: Teresa [name], female   DOB: [birth year], 71 y.o.   MRN: [id number]  HPI This patient follows up again this week for wound check. We applied another layer of Acell last week.she has no complaints and no issues.    Review of Systems     Objective:   Physical Exam The wound is clean and granulating well. There is not much noticeable  improvement  from last week. I applied with a dry dressing. She still has some slight tunneling of the left lateral aspect of the wound and there is not much contraction of the wound.    Assessment:     Chronic abdominal wound status post abdominal wound he has since The wound is granulating well as healthy without sign of infection but she has stalled out on her wound healing. I decided to stop applying the Acell. We decided to go back to wet to dry dressings and we will plan for skin graft when convenient for the patient.    Plan:     We will return to wet to dry dressings and plan for skin graft in another couple weeks.

## 2011-08-08 ENCOUNTER — Telehealth (INDEPENDENT_AMBULATORY_CARE_PROVIDER_SITE_OTHER): Payer: Self-pay

## 2011-08-08 NOTE — Telephone Encounter (Signed)
Appointment made for Teresa [name] [phone], patient appt for 08/20/11 @ 9:00 (arrive 8:45)  Will fax records to [facility]

## 2011-08-08 NOTE — Telephone Encounter (Signed)
error 

## 2011-08-13 ENCOUNTER — Other Ambulatory Visit (INDEPENDENT_AMBULATORY_CARE_PROVIDER_SITE_OTHER): Payer: Self-pay

## 2011-08-13 DIAGNOSIS — S31109A Unspecified open wound of abdominal wall, unspecified quadrant without penetration into peritoneal cavity, initial encounter: Secondary | ICD-10-CM

## 2011-08-15 ENCOUNTER — Telehealth (INDEPENDENT_AMBULATORY_CARE_PROVIDER_SITE_OTHER): Payer: Self-pay

## 2011-08-15 ENCOUNTER — Ambulatory Visit: Payer: Medicare Other

## 2011-08-15 NOTE — Telephone Encounter (Signed)
Appointment rescheduled at patient request to 08/27/11 w/Dr. Migdalia Dk at [facility]

## 2011-08-17 ENCOUNTER — Ambulatory Visit (INDEPENDENT_AMBULATORY_CARE_PROVIDER_SITE_OTHER): Payer: Medicare Other | Admitting: Family Medicine

## 2011-08-17 VITALS — BP 133/69 | HR 61 | Temp 98.4°F | Resp 18 | Ht 63.25 in | Wt 144.0 lb

## 2011-08-17 DIAGNOSIS — R6889 Other general symptoms and signs: Secondary | ICD-10-CM

## 2011-08-17 DIAGNOSIS — R899 Unspecified abnormal finding in specimens from other organs, systems and tissues: Secondary | ICD-10-CM

## 2011-08-17 LAB — POCT CBC
HCT, POC: 36.9 % — AB (ref 37.7–47.9)
Hemoglobin: 11.2 g/dL — AB (ref 12.2–16.2)
Lymph, poc: 2.2 (ref 0.6–3.4)
MCH, POC: 27.7 pg (ref 27–31.2)
MCHC: 30.4 g/dL — AB (ref 31.8–35.4)
MCV: 91.1 fL (ref 80–97)
MPV: 8.3 fL (ref 0–99.8)
POC MID %: 4.9 %M (ref 0–12)
WBC: 8.7 10*3/uL (ref 4.6–10.2)

## 2011-08-19 ENCOUNTER — Encounter: Payer: Self-pay | Admitting: Family Medicine

## 2011-08-19 ENCOUNTER — Other Ambulatory Visit: Payer: Self-pay | Admitting: Physician Assistant

## 2011-08-19 NOTE — Progress Notes (Signed)
Here to have CBC as her last CBC may have been in error

## 2011-08-22 ENCOUNTER — Encounter (INDEPENDENT_AMBULATORY_CARE_PROVIDER_SITE_OTHER): Payer: Medicare Other | Admitting: General Surgery

## 2011-08-27 ENCOUNTER — Encounter (HOSPITAL_BASED_OUTPATIENT_CLINIC_OR_DEPARTMENT_OTHER): Payer: Medicare Other | Attending: Plastic Surgery

## 2011-08-27 DIAGNOSIS — I509 Heart failure, unspecified: Secondary | ICD-10-CM | POA: Insufficient documentation

## 2011-08-27 DIAGNOSIS — L98499 Non-pressure chronic ulcer of skin of other sites with unspecified severity: Secondary | ICD-10-CM | POA: Insufficient documentation

## 2011-08-27 DIAGNOSIS — E079 Disorder of thyroid, unspecified: Secondary | ICD-10-CM | POA: Insufficient documentation

## 2011-08-27 DIAGNOSIS — I10 Essential (primary) hypertension: Secondary | ICD-10-CM | POA: Insufficient documentation

## 2011-08-28 ENCOUNTER — Other Ambulatory Visit: Payer: Self-pay | Admitting: *Deleted

## 2011-08-28 MED ORDER — LEVOTHYROXINE SODIUM 88 MCG PO TABS: 88.0000 ug | ORAL_TABLET | Freq: Every day | ORAL | Status: DC

## 2011-08-28 NOTE — Progress Notes (Signed)
[facility]  NAME:  Teresa [name], Teresa [name]                ACCOUNT NO.:  [id number]  MEDICAL RECORD NO.:  [id number]      DATE OF BIRTH:  29-Jun-1940  PHYSICIAN:  Theodoro Kos, DO            VISIT DATE:                                  OFFICE VISIT   CHIEF COMPLAINT:  Abdominal ulcer.  HISTORY OF PRESENT ILLNESS:  The patient is a 71 year old female here with her daughter for evaluation of an abdominal ulcer.  The patient states that she has been using wet-to-dry normal saline dressings on the area with very little improvement.  Her original injury was a laparotomy for small bowel resection in November for treatment of a bowel obstruction.  She has done very well from the healing aspect of the bowel, but the abdomen has remained open and been healing secondarily. Due to the secondary healing, it is not at the point of closure.  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  She has also had ACell placed on it in the past and it looks like Oasis as well.  There does not appear to be infected tissue or fibrous tissue.  There is nice granulation tissue with normal biological film over it.  The surrounding area is soft and not hard. There does appear to be a small amount of healing from the edges in the inferior aspect of the wound, very little undermining as well.  PAST MEDICAL HISTORY:  Positive for CHF, hypertension, thyroid disease, and bowel obstruction.  SURGERY:  Includes glaucoma; tonsillectomy; lumpectomy of right breast, benign; tubal ligation, ruptured that led to an appendectomy; peritonitis; right knee surgery; umbilical hernia repair with multiple surgeries for that; thyroidectomy and small bowel resection.  MEDICATIONS:  Coreg, losartan, and levothyroxine.  ALLERGIES:  ASPIRIN.  SOCIAL HISTORY:  The patient lives at home and is not a smoker.  REVIEW OF SYSTEMS:  No significant change in weight,  blood in her urine or her stool, difficulty breathing, or chest pain.  PHYSICAL EXAMINATION:  GENERAL:  The patient is alert, oriented, cooperative, not in any acute distress.  She is a good historian.  She is very pleasant. HEENT:  Pupils are equal.  Extraocular muscles are intact. NECK:  No cervical lymphadenopathy. LUNGS:  Her breathing is unlabored. HEART:  Regular.  Her abdominal wound is described above and noted in the nurse's note. The surrounding tissue is soft and nontender and the edges come together.  ASSESSMENT:  Abdominal ulcer.  PLAN:  Dakin's wet-to-dry dressing changes twice a day to help decrease bacterial count and get her ready for the operating room.  It does look like this is something that can be closed after debridement and alleviate her from having to do dressing changes for the next several months and I did place a call to Dr. __________ to discuss this and we will also apply for the [facility] as I think this would respond very well to negative pressure treatment.     Theodoro Kos, DO     CS/MEDQ  D:  08/27/2011  T:  08/28/2011  Job:  [id number]

## 2011-08-29 ENCOUNTER — Encounter (INDEPENDENT_AMBULATORY_CARE_PROVIDER_SITE_OTHER): Payer: Self-pay | Admitting: General Surgery

## 2011-08-29 ENCOUNTER — Telehealth (INDEPENDENT_AMBULATORY_CARE_PROVIDER_SITE_OTHER): Payer: Self-pay | Admitting: General Surgery

## 2011-08-29 NOTE — Telephone Encounter (Signed)
I called her to see how she was doing.  She is doing BID Dakins solution as per Dr. Migdalia Dk.  She saw her in consultation for possible wound closure and she thinks that she can close the skin.  I recommended that she close the skin over the wound and if she develops a hernia (which I explained that she probably will), then we be able to fix that in the future which would be better since we would have more mesh options and higher likelihood of success.  She agreed with this plan and will plan for wound closure with Dr. Migdalia Dk.  She will follow up with me after her procedure or sooner as needed.

## 2011-09-04 NOTE — Progress Notes (Signed)
[facility]  NAME:  Teresa [name], Teresa [name]                ACCOUNT NO.:  [id number]  MEDICAL RECORD NO.:  [id number]      DATE OF BIRTH:  1940/11/10  PHYSICIAN:  Theodoro Kos, DO       VISIT DATE:  09/03/2011                                  OFFICE VISIT   The patient is a 71 year old female who is here for followup on her abdominal surgical wound.  She has been using wet-to-dry Dakin's and the area does look like it is cleaning up and getting beefy red in color instead of a pale pink.  She is pleased with the progress.  She does have very good laxity.  PHYSICAL EXAMINATION:  She is alert, oriented, cooperative, not in any acute distress.  She is very pleasant.  Her pupils are equal. Extraocular muscles are intact.  No cervical lymphadenopathy.  Her breathing is unlabored.  Her heart is regular.  Her abdomen is soft. The lateral skin is able to fold up over the area and looks like it will close.  I did speak with Dr. Lilyan Punt and we discussed in detail the abdominal hernia and whether or not to do it at the same time.  He feels strongly that having the wound closed and doing a second procedure for the hernia will be in her best interest.  I think this is reasonable and we will move towards getting her on the schedule for abdominal wall repair.     Theodoro Kos, DO     CS/MEDQ  D:  09/03/2011  T:  09/04/2011  Job:  [id number]

## 2011-09-11 NOTE — Progress Notes (Signed)
[facility]  NAME:  Teresa [name], Teresa [name]                ACCOUNT NO.:  [id number]  MEDICAL RECORD NO.:  [id number]      DATE OF BIRTH:  August 29, 1940  PHYSICIAN:  Theodoro Kos, DO       VISIT DATE:  09/10/2011                                  OFFICE VISIT   The patient is a 71 year old female who is here for followup on her abdominal wound.  She has been using Dakin wet to dry with good improvement.  There has been no change in her medications or social history.  PHYSICAL EXAMINATION:  GENERAL:  She is alert, oriented, and cooperative, not in any acute distress.  She is pleasant. HEENT:  Pupils are equal.  Extraocular muscles are intact.  No cervical lymphadenopathy.  Her breathing is unlabored. HEART:  Regular.  The wound shows some forms of granulation, but no great change in its overall appearance.  She still has skin laxity, and I am still able to get the skin edges together.  We are also working on getting her on the schedule.  In the meantime, I recommend to continue with a normal saline wet-to-dry dressings.     Theodoro Kos, DO     CS/MEDQ  D:  09/10/2011  T:  09/11/2011  Job:  [id number]

## 2011-09-15 ENCOUNTER — Other Ambulatory Visit: Payer: Self-pay | Admitting: Physician Assistant

## 2011-09-17 ENCOUNTER — Encounter (HOSPITAL_BASED_OUTPATIENT_CLINIC_OR_DEPARTMENT_OTHER): Payer: Medicare Other | Attending: Plastic Surgery

## 2011-09-17 DIAGNOSIS — T8189XA Other complications of procedures, not elsewhere classified, initial encounter: Secondary | ICD-10-CM | POA: Insufficient documentation

## 2011-09-17 DIAGNOSIS — Y838 Other surgical procedures as the cause of abnormal reaction of the patient, or of later complication, without mention of misadventure at the time of the procedure: Secondary | ICD-10-CM | POA: Insufficient documentation

## 2011-09-19 ENCOUNTER — Other Ambulatory Visit: Payer: Self-pay | Admitting: Plastic Surgery

## 2011-09-22 ENCOUNTER — Encounter (HOSPITAL_BASED_OUTPATIENT_CLINIC_OR_DEPARTMENT_OTHER): Payer: Self-pay | Admitting: *Deleted

## 2011-09-22 NOTE — Progress Notes (Addendum)
NPO AFTER MN WITH EXCEPTION WATER/ GATORADE/ BLACK COFFEE (NO CREAM/ MILK PRODUCTS) UNTIL 0730. ARRIVES AT 1200. NEEDS ISTAT AND CXR. CURRENT EKG W/ CHART. WILL TAKE SYNTHROID AND XANAX AM OF SURG W/ SIP OF WATER. REVIEW [facility] GUIDELINES, WILL BRING MEDS.  REVIEWED CHART W/ DR ROSE MDA, OK TO PROCEED.

## 2011-09-24 ENCOUNTER — Encounter (HOSPITAL_BASED_OUTPATIENT_CLINIC_OR_DEPARTMENT_OTHER): Payer: Medicare Other

## 2011-09-25 ENCOUNTER — Ambulatory Visit (HOSPITAL_BASED_OUTPATIENT_CLINIC_OR_DEPARTMENT_OTHER): Payer: Medicare Other | Admitting: Anesthesiology

## 2011-09-25 ENCOUNTER — Ambulatory Visit (HOSPITAL_COMMUNITY): Payer: Medicare Other

## 2011-09-25 ENCOUNTER — Encounter (HOSPITAL_BASED_OUTPATIENT_CLINIC_OR_DEPARTMENT_OTHER)
Admission: RE | Disposition: A | Discharge status: Home / Self Care | Payer: Self-pay | Source: Ambulatory Visit | Attending: Plastic Surgery

## 2011-09-25 ENCOUNTER — Encounter (HOSPITAL_BASED_OUTPATIENT_CLINIC_OR_DEPARTMENT_OTHER): Payer: Self-pay | Admitting: Anesthesiology

## 2011-09-25 ENCOUNTER — Ambulatory Visit (HOSPITAL_BASED_OUTPATIENT_CLINIC_OR_DEPARTMENT_OTHER)
Admission: RE | Admit: 2011-09-25 | Discharge: 2011-09-26 | Disposition: A | Discharge status: Home / Self Care | Payer: Medicare Other | Source: Ambulatory Visit | Attending: Plastic Surgery | Admitting: Plastic Surgery

## 2011-09-25 ENCOUNTER — Encounter (HOSPITAL_BASED_OUTPATIENT_CLINIC_OR_DEPARTMENT_OTHER): Payer: Self-pay | Admitting: *Deleted

## 2011-09-25 DIAGNOSIS — I10 Essential (primary) hypertension: Secondary | ICD-10-CM | POA: Insufficient documentation

## 2011-09-25 DIAGNOSIS — T8189XA Other complications of procedures, not elsewhere classified, initial encounter: Secondary | ICD-10-CM | POA: Insufficient documentation

## 2011-09-25 DIAGNOSIS — L98499 Non-pressure chronic ulcer of skin of other sites with unspecified severity: Secondary | ICD-10-CM | POA: Insufficient documentation

## 2011-09-25 DIAGNOSIS — E119 Type 2 diabetes mellitus without complications: Secondary | ICD-10-CM | POA: Insufficient documentation

## 2011-09-25 DIAGNOSIS — Z79899 Other long term (current) drug therapy: Secondary | ICD-10-CM | POA: Insufficient documentation

## 2011-09-25 DIAGNOSIS — K219 Gastro-esophageal reflux disease without esophagitis: Secondary | ICD-10-CM | POA: Insufficient documentation

## 2011-09-25 DIAGNOSIS — Y838 Other surgical procedures as the cause of abnormal reaction of the patient, or of later complication, without mention of misadventure at the time of the procedure: Secondary | ICD-10-CM | POA: Insufficient documentation

## 2011-09-25 HISTORY — DX: Peptic ulcer, site unspecified, unspecified as acute or chronic, without hemorrhage or perforation: K27.9

## 2011-09-25 HISTORY — DX: Other complications of procedures, not elsewhere classified, initial encounter: T81.89XA

## 2011-09-25 HISTORY — DX: Nonrheumatic mitral (valve) insufficiency: I34.0

## 2011-09-25 HISTORY — DX: Rheumatic tricuspid insufficiency: I07.1

## 2011-09-25 HISTORY — DX: Personal history of other diseases of the nervous system and sense organs: Z86.69

## 2011-09-25 HISTORY — DX: Gastro-esophageal reflux disease without esophagitis: K21.9

## 2011-09-25 HISTORY — DX: Type 2 diabetes mellitus without complications: E11.9

## 2011-09-25 HISTORY — DX: Urge incontinence: N39.41

## 2011-09-25 HISTORY — DX: Unspecified ovarian cyst, left side: N83.202

## 2011-09-25 HISTORY — DX: Personal history of other diseases of the digestive system: Z87.19

## 2011-09-25 HISTORY — PX: WOUND DEBRIDEMENT: SHX247

## 2011-09-25 LAB — GLUCOSE, CAPILLARY: Glucose-Capillary: 94 mg/dL (ref 70–99)

## 2011-09-25 LAB — POCT I-STAT 4, (NA,K, GLUC, HGB,HCT)
Glucose, Bld: 105 mg/dL — ABNORMAL HIGH (ref 70–99)
HCT: 31 % — ABNORMAL LOW (ref 36.0–46.0)
Potassium: 4.2 mEq/L (ref 3.5–5.1)

## 2011-09-25 SURGERY — IRRIGATION AND DEBRIDEMENT, WOUND, ABDOMEN, WITH CLOSURE
Anesthesia: General | Site: Abdomen | Wound class: Clean

## 2011-09-25 MED ORDER — SODIUM CHLORIDE 0.9 % IR SOLN
Status: DC | PRN
  Administered 2011-09-25: 13:00:00

## 2011-09-25 MED ORDER — POLYETHYLENE GLYCOL 3350 17 G PO PACK: 17.0000 g | PACK | Freq: Every day | ORAL | Status: DC | PRN

## 2011-09-25 MED ORDER — BUPIVACAINE HCL (PF) 0.25 % IJ SOLN
INTRAMUSCULAR | Status: DC | PRN
  Administered 2011-09-25: 26 mL

## 2011-09-25 MED ORDER — SENNA 8.6 MG PO TABS: 1.0000 | ORAL_TABLET | Freq: Two times a day (BID) | ORAL | Status: DC

## 2011-09-25 MED ORDER — CEFAZOLIN SODIUM 1-5 GM-% IV SOLN
1.0000 g | INTRAVENOUS | Status: AC
  Administered 2011-09-25: 1 g via INTRAVENOUS

## 2011-09-25 MED ORDER — MIDAZOLAM HCL 5 MG/5ML IJ SOLN
INTRAMUSCULAR | Status: DC | PRN
  Administered 2011-09-25: 1 mg via INTRAVENOUS

## 2011-09-25 MED ORDER — PROMETHAZINE HCL 25 MG/ML IJ SOLN: 6.2500 mg | INTRAMUSCULAR | Status: DC | PRN

## 2011-09-25 MED ORDER — PANTOPRAZOLE SODIUM 40 MG IV SOLR: 40.0000 mg | Freq: Every day | INTRAVENOUS | Status: DC

## 2011-09-25 MED ORDER — CEFAZOLIN SODIUM 1-5 GM-% IV SOLN
1.0000 g | Freq: Three times a day (TID) | INTRAVENOUS | Status: DC
  Administered 2011-09-25 – 2011-09-26 (×2): 1 g via INTRAVENOUS

## 2011-09-25 MED ORDER — MORPHINE SULFATE 2 MG/ML IJ SOLN: 2.0000 mg | INTRAMUSCULAR | Status: DC | PRN

## 2011-09-25 MED ORDER — LEVOTHYROXINE SODIUM 88 MCG PO TABS: 88.0000 ug | ORAL_TABLET | Freq: Every morning | ORAL | Status: DC

## 2011-09-25 MED ORDER — ONDANSETRON HCL 4 MG/2ML IJ SOLN: 4.0000 mg | Freq: Four times a day (QID) | INTRAMUSCULAR | Status: DC | PRN

## 2011-09-25 MED ORDER — FENTANYL CITRATE 0.05 MG/ML IJ SOLN
25.0000 ug | INTRAMUSCULAR | Status: DC | PRN
  Administered 2011-09-25: 50 ug via INTRAVENOUS
  Administered 2011-09-25 (×2): 25 ug via INTRAVENOUS

## 2011-09-25 MED ORDER — PROPOFOL 10 MG/ML IV EMUL
INTRAVENOUS | Status: DC | PRN
  Administered 2011-09-25 (×2): 10 mg via INTRAVENOUS
  Administered 2011-09-25: 200 mg via INTRAVENOUS
  Administered 2011-09-25 (×2): 10 mg via INTRAVENOUS

## 2011-09-25 MED ORDER — LIDOCAINE HCL (CARDIAC) 20 MG/ML IV SOLN
INTRAVENOUS | Status: DC | PRN
  Administered 2011-09-25: 80 mg via INTRAVENOUS

## 2011-09-25 MED ORDER — LOSARTAN POTASSIUM 25 MG PO TABS: 25.0000 mg | ORAL_TABLET | Freq: Every day | ORAL | Status: DC

## 2011-09-25 MED ORDER — FIBRIN SEALANT COMPONENT 5 ML EX KIT
PACK | CUTANEOUS | Status: DC | PRN
  Administered 2011-09-25: 5 mL

## 2011-09-25 MED ORDER — CARVEDILOL 12.5 MG PO TABS: 12.5000 mg | ORAL_TABLET | Freq: Two times a day (BID) | ORAL | Status: DC

## 2011-09-25 MED ORDER — GLYCOPYRROLATE 0.2 MG/ML IJ SOLN
INTRAMUSCULAR | Status: DC | PRN
  Administered 2011-09-25: 0.2 mg via INTRAVENOUS

## 2011-09-25 MED ORDER — SPIRONOLACTONE 25 MG PO TABS: 25.0000 mg | ORAL_TABLET | Freq: Every day | ORAL | Status: DC

## 2011-09-25 MED ORDER — BISACODYL 5 MG PO TBEC: 5.0000 mg | DELAYED_RELEASE_TABLET | Freq: Every day | ORAL | Status: DC | PRN

## 2011-09-25 MED ORDER — HYDROMORPHONE HCL PF 2 MG/ML IJ SOLN
0.2500 mg | INTRAMUSCULAR | Status: DC | PRN
  Administered 2011-09-25 – 2011-09-26 (×5): 0.5 mg via INTRAVENOUS

## 2011-09-25 MED ORDER — FENTANYL CITRATE 0.05 MG/ML IJ SOLN
INTRAMUSCULAR | Status: DC | PRN
  Administered 2011-09-25: 25 ug via INTRAVENOUS
  Administered 2011-09-25 (×2): 50 ug via INTRAVENOUS
  Administered 2011-09-25 (×4): 25 ug via INTRAVENOUS
  Administered 2011-09-25: 50 ug via INTRAVENOUS
  Administered 2011-09-25 (×9): 25 ug via INTRAVENOUS

## 2011-09-25 MED ORDER — LACTATED RINGERS IV SOLN
INTRAVENOUS | Status: DC
  Administered 2011-09-25 (×3): via INTRAVENOUS

## 2011-09-25 MED ORDER — HYDROCODONE-ACETAMINOPHEN 5-325 MG PO TABS
1.0000 | ORAL_TABLET | Freq: Four times a day (QID) | ORAL | Status: DC | PRN
  Administered 2011-09-25 – 2011-09-26 (×3): 1 via ORAL

## 2011-09-25 SURGICAL SUPPLY — 105 items
ADH SKN CLS APL DERMABOND .7 (GAUZE/BANDAGES/DRESSINGS) ×2
APL SKNCLS STERI-STRIP NONHPOA (GAUZE/BANDAGES/DRESSINGS)
BAG DECANTER FOR FLEXI CONT (MISCELLANEOUS) IMPLANT
BANDAGE ELASTIC 3 VELCRO ST LF (GAUZE/BANDAGES/DRESSINGS) IMPLANT
BANDAGE ELASTIC 4 VELCRO ST LF (GAUZE/BANDAGES/DRESSINGS) IMPLANT
BANDAGE ELASTIC 6 VELCRO ST LF (GAUZE/BANDAGES/DRESSINGS) IMPLANT
BANDAGE GAUZE ELAST BULKY 4 IN (GAUZE/BANDAGES/DRESSINGS) IMPLANT
BENZOIN TINCTURE PRP APPL 2/3 (GAUZE/BANDAGES/DRESSINGS) IMPLANT
BINDER ABD UNIV 12 30-45 (MISCELLANEOUS) ×1 IMPLANT
BINDER ABDOMINAL 12 (MISCELLANEOUS) ×3
BLADE MINI RND TIP GREEN BEAV (BLADE) IMPLANT
BLADE SURG 10 STRL SS (BLADE) ×2 IMPLANT
BLADE SURG 15 STRL LF DISP TIS (BLADE) ×2 IMPLANT
BLADE SURG 15 STRL SS (BLADE) ×3
BNDG CMPR 9X4 STRL LF SNTH (GAUZE/BANDAGES/DRESSINGS)
BNDG COHESIVE 1X5 TAN STRL LF (GAUZE/BANDAGES/DRESSINGS) IMPLANT
BNDG COHESIVE 4X5 TAN STRL (GAUZE/BANDAGES/DRESSINGS) IMPLANT
BNDG ESMARK 4X9 LF (GAUZE/BANDAGES/DRESSINGS) IMPLANT
CANISTER OMNI JUG 16 LITER (MISCELLANEOUS) IMPLANT
CANISTER SUCTION 1200CC (MISCELLANEOUS) IMPLANT
CANISTER SUCTION 2500CC (MISCELLANEOUS) ×2 IMPLANT
CHLORAPREP W/TINT 26ML (MISCELLANEOUS) IMPLANT
CLOTH BEACON ORANGE TIMEOUT ST (SAFETY) ×3 IMPLANT
CORDS BIPOLAR (ELECTRODE) IMPLANT
COVER MAYO STAND STRL (DRAPES) ×3 IMPLANT
COVER TABLE BACK 60X90 (DRAPES) ×3 IMPLANT
DECANTER SPIKE VIAL GLASS SM (MISCELLANEOUS) IMPLANT
DERMABOND ADVANCED (GAUZE/BANDAGES/DRESSINGS) ×1
DERMABOND ADVANCED .7 DNX12 (GAUZE/BANDAGES/DRESSINGS) ×1 IMPLANT
DRAIN CHANNEL 19F RND (DRAIN) ×4 IMPLANT
DRAIN PENROSE 18X1/2 LTX STRL (DRAIN) ×3 IMPLANT
DRAPE EXTREMITY T 121X128X90 (DRAPE) IMPLANT
DRAPE INCISE IOBAN 66X45 STRL (DRAPES) ×2 IMPLANT
DRAPE LAPAROTOMY TRNSV 102X78 (DRAPE) ×2 IMPLANT
DRSG EMULSION OIL 3X3 NADH (GAUZE/BANDAGES/DRESSINGS) IMPLANT
DRSG PAD ABDOMINAL 8X10 ST (GAUZE/BANDAGES/DRESSINGS) ×2 IMPLANT
ELECT COATED BLADE 2.86 ST (ELECTRODE) ×2 IMPLANT
ELECT NDL TIP 2.8 STRL (NEEDLE) IMPLANT
ELECT NEEDLE TIP 2.8 STRL (NEEDLE) IMPLANT
ELECT REM PT RETURN 9FT ADLT (ELECTROSURGICAL) ×3
ELECTRODE REM PT RTRN 9FT ADLT (ELECTROSURGICAL) ×2 IMPLANT
EVACUATOR DRAINAGE 7X20 100CC (MISCELLANEOUS) ×2 IMPLANT
EVACUATOR SILICONE 100CC (MISCELLANEOUS) ×6
EVICEL FIBRIN SEALANT ×3 IMPLANT
GAUZE SPONGE 4X4 12PLY STRL LF (GAUZE/BANDAGES/DRESSINGS) IMPLANT
GAUZE XEROFORM 1X8 LF (GAUZE/BANDAGES/DRESSINGS) ×2 IMPLANT
GAUZE XEROFORM 5X9 LF (GAUZE/BANDAGES/DRESSINGS) IMPLANT
GLOVE BIO SURGEON STRL SZ 6.5 (GLOVE) ×3 IMPLANT
GOWN PREVENTION PLUS XLARGE (GOWN DISPOSABLE) ×3 IMPLANT
HANDPIECE INTERPULSE COAX TIP (DISPOSABLE)
IV NS IRRIG 3000ML ARTHROMATIC (IV SOLUTION) IMPLANT
NDL HYPO 30GX1 BEV (NEEDLE) IMPLANT
NEEDLE 27GAX1X1/2 (NEEDLE) IMPLANT
NEEDLE HYPO 30GX1 BEV (NEEDLE) IMPLANT
NS IRRIG 1000ML POUR BTL (IV SOLUTION) ×3 IMPLANT
PACK BASIN DAY SURGERY FS (CUSTOM PROCEDURE TRAY) ×3 IMPLANT
PADDING CAST ABS 3INX4YD NS (CAST SUPPLIES)
PADDING CAST ABS 4INX4YD NS (CAST SUPPLIES)
PADDING CAST ABS COTTON 3X4 (CAST SUPPLIES) IMPLANT
PADDING CAST ABS COTTON 4X4 ST (CAST SUPPLIES) IMPLANT
PENCIL BUTTON HOLSTER BLD 10FT (ELECTRODE) ×2 IMPLANT
SET HNDPC FAN SPRY TIP SCT (DISPOSABLE) IMPLANT
SHEET MEDIUM DRAPE 40X70 STRL (DRAPES) IMPLANT
SLEEVE SCD COMPRESS KNEE MED (MISCELLANEOUS) ×2 IMPLANT
SPLINT PLASTER CAST XFAST 3X15 (CAST SUPPLIES) IMPLANT
SPLINT PLASTER XTRA FASTSET 3X (CAST SUPPLIES)
SPONGE GAUZE 4X4 12PLY (GAUZE/BANDAGES/DRESSINGS) ×3 IMPLANT
SPONGE LAP 18X18 X RAY DECT (DISPOSABLE) ×4 IMPLANT
SPONGE LAP 4X18 X RAY DECT (DISPOSABLE) IMPLANT
STAPLER VISISTAT 35W (STAPLE) IMPLANT
STOCKINETTE 4X48 STRL (DRAPES) IMPLANT
STOCKINETTE 6  STRL (DRAPES) ×1
STOCKINETTE 6 STRL (DRAPES) ×2 IMPLANT
STOCKINETTE IMPERVIOUS LG (DRAPES) IMPLANT
STRIP CLOSURE SKIN 1/2X4 (GAUZE/BANDAGES/DRESSINGS) IMPLANT
SUCTION FRAZIER TIP 10 FR DISP (SUCTIONS) IMPLANT
SURGILUBE 2OZ TUBE FLIPTOP (MISCELLANEOUS) IMPLANT
SUT ETHILON 3 0 PS 1 (SUTURE) IMPLANT
SUT ETHILON 4 0 P 3 18 (SUTURE) IMPLANT
SUT ETHILON 5 0 PS 2 18 (SUTURE) IMPLANT
SUT MNCRL AB 4-0 PS2 18 (SUTURE) ×4 IMPLANT
SUT PDS AB 2-0 CT2 27 (SUTURE) ×12 IMPLANT
SUT PROLENE 3 0 PS 2 (SUTURE) ×2 IMPLANT
SUT SILK 3 0 PS 1 (SUTURE) ×4 IMPLANT
SUT VIC AB 2-0 CT1 27 (SUTURE) ×6
SUT VIC AB 2-0 CT1 TAPERPNT 27 (SUTURE) ×2 IMPLANT
SUT VIC AB 3-0 FS2 27 (SUTURE) IMPLANT
SUT VIC AB 3-0 PS1 18 (SUTURE) ×9
SUT VIC AB 3-0 PS1 18XBRD (SUTURE) ×3 IMPLANT
SUT VIC AB 4-0 SH 27 (SUTURE) ×3
SUT VIC AB 4-0 SH 27XANBCTRL (SUTURE) ×2 IMPLANT
SUT VIC AB 5-0 P-3 18X BRD (SUTURE) IMPLANT
SUT VIC AB 5-0 P3 18 (SUTURE)
SUT VIC AB 5-0 PS2 18 (SUTURE) IMPLANT
SUT VICRYL 4-0 PS2 18IN ABS (SUTURE) ×2 IMPLANT
SYR BULB IRRIGATION 50ML (SYRINGE) ×2 IMPLANT
SYR CONTROL 10ML LL (SYRINGE) ×3 IMPLANT
TAPE HYPAFIX 6X30 (GAUZE/BANDAGES/DRESSINGS) IMPLANT
TIP RIGID 35CM EVICEL (HEMOSTASIS) ×2 IMPLANT
TOWEL OR 17X24 6PK STRL BLUE (TOWEL DISPOSABLE) ×3 IMPLANT
TRAY DSU PREP LF (CUSTOM PROCEDURE TRAY) ×3 IMPLANT
TUBE CONNECTING 12X1/4 (SUCTIONS) ×2 IMPLANT
UNDERPAD 30X30 INCONTINENT (UNDERPADS AND DIAPERS) ×3 IMPLANT
WATER STERILE IRR 1000ML POUR (IV SOLUTION) ×3 IMPLANT
YANKAUER SUCT BULB TIP NO VENT (SUCTIONS) ×2 IMPLANT

## 2011-09-25 NOTE — Anesthesia Preprocedure Evaluation (Addendum)
Anesthesia Evaluation  Patient identified by MRN, date of birth, ID band Patient awake    Reviewed: Allergy & Precautions, H&P , NPO status , Patient's Chart, lab work & pertinent test results  Airway Mallampati: II TM Distance: <3 FB Neck ROM: Full    Dental No notable dental hx.    Pulmonary neg pulmonary ROS,  breath sounds clear to auscultation  Pulmonary exam normal       Cardiovascular hypertension, Pt. on medications +CHF + Valvular Problems/Murmurs MR Rhythm:Regular Rate:Normal  Cardiomyopathy EF 50%   Neuro/Psych Anxiety negative neurological ROS     GI/Hepatic Neg liver ROS, GERD-  ,  Endo/Other  Hypothyroidism   Renal/GU negative Renal ROS  negative genitourinary   Musculoskeletal negative musculoskeletal ROS (+)   Abdominal   Peds negative pediatric ROS (+)  Hematology negative hematology ROS (+)   Anesthesia Other Findings   Reproductive/Obstetrics negative OB ROS                           Anesthesia Physical Anesthesia Plan  ASA: III  Anesthesia Plan: General   Post-op Pain Management:    Induction: Intravenous  Airway Management Planned: LMA  Additional Equipment:   Intra-op Plan:   Post-operative Plan: Extubation in OR  Informed Consent: I have reviewed the patients History and Physical, chart, labs and discussed the procedure including the risks, benefits and alternatives for the proposed anesthesia with the patient or authorized representative who has indicated his/her understanding and acceptance.   Dental advisory given  Plan Discussed with: CRNA  Anesthesia Plan Comments:        Anesthesia Quick Evaluation

## 2011-09-25 NOTE — Transfer of Care (Signed)
Immediate Anesthesia Transfer of Care Note  Patient: Teresa [name]  Procedure(s) Performed: Procedure(s) (LRB): DEBRIDEMENT CLOSURE/ABDOMINAL WOUND (N/A)  Patient Location: Patient transported to PACU with oxygen via face mask at 4 Liters / Min  Anesthesia Type: General  Level of Consciousness: awake and alert   Airway & Oxygen Therapy: Patient Spontanous Breathing and Patient connected to face mask oxygen  Post-op Assessment: Report given to PACU RN and Post -op Vital signs reviewed and stable  Post vital signs: Reviewed and stable  Dentition: Teeth and oropharynx remain in pre-op condition  Complications: No apparent anesthesia complications

## 2011-09-25 NOTE — Anesthesia Postprocedure Evaluation (Signed)
Anesthesia Post Note  Patient: Teresa [name]  Procedure(s) Performed: Procedure(s) (LRB): DEBRIDEMENT CLOSURE/ABDOMINAL WOUND (N/A)  Anesthesia type: General  Patient location: PACU  Post pain: Pain level controlled  Post assessment: Post-op Vital signs reviewed  Last Vitals:  Filed Vitals:   09/25/11 1810  BP: 133/65  Pulse: 63  Temp: 36.7 C  Resp: 18    Post vital signs: Reviewed  Level of consciousness: sedated  Complications: No apparent anesthesia complications

## 2011-09-25 NOTE — Anesthesia Procedure Notes (Signed)
Procedure Name: LMA Insertion Date/Time: 09/25/2011 12:33 PM Performed by: Christiana Fuchs Pre-anesthesia Checklist: Patient identified, Emergency Drugs available, Suction available and Patient being monitored Patient Re-evaluated:Patient Re-evaluated prior to inductionOxygen Delivery Method: Circle System Utilized Preoxygenation: Pre-oxygenation with 100% oxygen Intubation Type: IV induction Ventilation: Mask ventilation without difficulty LMA: LMA inserted LMA Size: 4.0 Number of attempts: 1 Airway Equipment and Method: bite block Placement Confirmation: positive ETCO2 Tube secured with: Tape Dental Injury: Teeth and Oropharynx as per pre-operative assessment

## 2011-09-25 NOTE — H&P (Signed)
Teresa [name] is an 71 y.o. female.   Chief Complaint: Abdominal wound HPI: The patient is a 71 yrs old wf here with her son for treatment of her abdominal wound.  She has been doing dressing changes over the past several weeks.  She is otherwise doing well and not had any recent illnesses.  She has a component of an abdominal hernia and general surgery is prepared to deal with it after the wall is healed.  Past Medical History  Diagnosis Date  . Nonischemic cardiomyopathy CARDIOLOGIST- DR [facility] - LAST VISIT  AUG 2012  (IN EPIC)    EF is 25% per echo February 2012, non ischemic myoview 12/2009.  EF 50% echo 7/12  . HTN (hypertension)   . PVC (premature ventricular contraction)   . Heart palpitations   . Valvular heart disease     Moderate to severe MR, moderate TR, LAE per echo 7/11  . Spinal stenosis   . Abdominal pain   . History of glaucoma   . Non-healing surgical wound ABDOMINAL S/P EXPLORATOY LAPAROTOMY 04-18-2011  . History of small bowel obstruction 2009 AND NOV 2012  . Peptic ulcer disease   . GERD (gastroesophageal reflux disease)   . H/O CHF   . Moderate mitral regurgitation   . Moderate tricuspid regurgitation   . Arthritis FEET, KNEES, HANDS  . H/O hiatal hernia   . Urge urinary incontinence   . Left ovarian cyst   . Diabetes mellitus type 2, diet-controlled     Past Surgical History  Procedure Date  . Tubal ligation   . Glaucoma surgery 1978  . Exploratomy lap. / extensive lysis adhesions/ small bowel resection x2 with primary anastomosis x2 04-18-2011    SMALL BOWEL OBSTRUCTION  . Knee arthroscopy w/ meniscectomy 06-05-2004  . Transthoracic echocardiogram 12-27-2010    EF 45-50%/ MODERATE MV REGURG. / LEFT ATRIUM MILDLY DILATED/ MILD TRICUSPID REGURG.  . Cardiovascular stress test JULY 2011-  DR HOCHREIN    NO ISCHEMIA  . Thyroidectomy 1978 GOITOR    AND CERVICAL COLD KNIFE CONE BX  . Tonsillectomy AGE 39  . Benign right breast tumor removed   .  Appendectomy 1979    RUPTURED  . Abdominal hernia repair 34 YRS AGO    PERITINITIS  . Ventral hernia repair X4  LAST ONE 20 YRS AGO    ABDOMINAL --  EACH ONE IN DIFFERENT AREA    Family History  Problem Relation Age of Onset  . Stroke Brother   . Stroke Sister   . Heart disease Sister    Social History:  reports that she quit smoking about a year ago. Her smoking use included Cigarettes. She quit after 30 years of use. She has never used smokeless tobacco. She reports that she does not drink alcohol or use illicit drugs.  Allergies:  Allergies  Allergen Reactions  . Aspirin Other (See Comments)    HX Bleeding Ulcer     Medications Prior to Admission  Medication Dose Route Frequency Provider Last Rate Last Dose  . ceFAZolin (ANCEF) IVPB 1 g/50 mL premix  1 g Intravenous 60 min Pre-Op Isaiah Torok Sanger, DO      . lactated ringers infusion   Intravenous Continuous Myrtie Soman, MD 100 mL/hr at 09/25/11 1145     Medications Prior to Admission  Medication Sig Dispense Refill  . ALPRAZolam (XANAX) 0.25 MG tablet       . COREG 12.5 MG tablet TAKE 1 TABLET TWICE DAILY WITH A MEAL.  60 each  6  . [facility]S ZINC PO Take 1 tablet by mouth daily.      Marland Kitchen esomeprazole (NEXIUM) 40 MG capsule Take 40 mg by mouth 3 (three) times a week. PT STATES TO EXPENSIVE TO TAKE DAILY      . fish oil-omega-3 fatty acids 1000 MG capsule Take 2 g by mouth 2 (two) times daily.       . furosemide (LASIX) 40 MG tablet Take 40 mg by mouth every morning.       Marland Kitchen levothyroxine (SYNTHROID, LEVOTHROID) 88 MCG tablet Take 88 mcg by mouth every morning.      Marland Kitchen losartan (COZAAR) 25 MG tablet Take 25 mg by mouth daily.      . Multiple Vitamin (MULTIVITAMIN) tablet Take 1 tablet by mouth daily.      . naphazoline (CLEAR EYES) 0.012 % ophthalmic solution Place 1 drop into both eyes as needed. For blood shot eyes      . simethicone (MYLICON) 80 MG chewable tablet Chew 80 mg by mouth every 6 (six) hours as needed. Gas pain      .  spironolactone (ALDACTONE) 25 MG tablet Take 25 mg by mouth daily.        Results for orders placed during the hospital encounter of 09/25/11 (from the past 48 hour(s))  POCT I-STAT 4, (NA,K, GLUC, HGB,HCT)     Status: Abnormal   Collection Time   09/25/11 11:46 AM      Component Value Range Comment   Sodium 143  135 - 145 (mEq/L)    Potassium 4.2  3.5 - 5.1 (mEq/L)    Glucose, Bld 105 (*) 70 - 99 (mg/dL)    HCT 31.0 (*) 36.0 - 46.0 (%)    Hemoglobin 10.5 (*) 12.0 - 15.0 (g/dL)    Dg Chest 2 View  09/25/2011  *RADIOLOGY REPORT*  Clinical Data: Preop for closure of the abdominal wound, former smoking history  CHEST - 2 VIEW  Comparison: Chest x-ray of 12/24/2009  Findings: No active infiltrate or effusion is seen.  Mediastinal contours are stable.  The heart is within upper limits of normal. No acute bony abnormality is seen.  IMPRESSION: No active lung disease.  Original Report Authenticated By: Joretta Bachelor, M.D.    Review of Systems  Constitutional: Negative.   HENT: Negative.   Eyes: Negative.   Respiratory: Negative.   Cardiovascular: Negative.   Gastrointestinal:       Abdominal wound  Genitourinary: Negative.   Musculoskeletal: Negative.   Skin: Negative.   Neurological: Negative.   Endo/Heme/Allergies: Negative.   Psychiatric/Behavioral: Negative.     Blood pressure 139/79, pulse 95, temperature 97.1 F (36.2 C), temperature source Oral, resp. rate 18, height 5\' 3"  (1.6 m), weight 64.411 kg (142 lb), SpO2 96.00%. Physical Exam  Constitutional: She appears well-developed and well-nourished.  HENT:  Head: Normocephalic and atraumatic.  Eyes: Conjunctivae and EOM are normal. Pupils are equal, round, and reactive to light.  Neck: Normal range of motion.  Cardiovascular: Normal rate.   Respiratory: Effort normal. No respiratory distress. She has no wheezes.  GI: Soft. She exhibits no distension. There is tenderness. There is guarding. There is no rebound.    Musculoskeletal: Normal range of motion.  Neurological: She is alert.  Skin: Skin is warm.  Psychiatric: She has a normal mood and affect. Her behavior is normal. Thought content normal.     Assessment/Plan Abdominal wound with plan to excise the wound and close the abdominal  defect.  SANGER,Wray Goehring 09/25/2011, 12:21 PM

## 2011-09-25 NOTE — Brief Op Note (Signed)
09/25/2011  2:27 PM  PATIENT:  Teresa [name]  71 y.o. female  PRE-OPERATIVE DIAGNOSIS:  NON HEALING ABDOMINAL WOUND  POST-OPERATIVE DIAGNOSIS:  NON HEALING ABDOMINAL WOUND  PROCEDURE:  Procedure(s) (LRB): EXCISION OF ABDOMINAL WOUND with PRIMARY CLOSURE  SURGEON:  Surgeon(s) and Role:    * Nyasha Rahilly Sanger, DO - Primary  PHYSICIAN ASSISTANT:   ASSISTANTS: none   ANESTHESIA:   local and general  EBL:  Total I/O In: 1000 [I.V.:1000] Out: -   BLOOD ADMINISTERED:none  DRAINS: (2) Jackson-Pratt drain(s) with closed bulb suction in the abdominal wall   LOCAL MEDICATIONS USED:  MARCAINE     SPECIMEN:  No Specimen  DISPOSITION OF SPECIMEN:  N/A  COUNTS:  YES  TOURNIQUET:  * No tourniquets in log *  DICTATION: .Dragon Dictation  PLAN OF CARE: Admit for overnight observation  PATIENT DISPOSITION:  PACU - hemodynamically stable.   Delay start of Pharmacological VTE agent (>24hrs) due to surgical blood loss or risk of bleeding: no

## 2011-09-26 ENCOUNTER — Encounter (HOSPITAL_BASED_OUTPATIENT_CLINIC_OR_DEPARTMENT_OTHER): Payer: Self-pay | Admitting: Plastic Surgery

## 2011-09-26 NOTE — Discharge Instructions (Addendum)
Wound Care Wound care helps prevent pain and infection.  You may need a tetanus shot if:  You cannot remember when you had your last tetanus shot.   You have never had a tetanus shot.   The injury broke your skin.  If you need a tetanus shot and you choose not to have one, you may get tetanus. Sickness from tetanus can be serious. HOME CARE   Only take medicine as told by your doctor.   Clean the wound daily with mild soap and water.   Change any bandages (dressings) as told by your doctor.   Put medicated cream and a bandage on the wound as told by your doctor.   Change the bandage if it gets wet, dirty, or starts to smell.   Take showers. Do not take baths, swim, or do anything that puts your wound under water.   Rest and raise (elevate) the wound until the pain and puffiness (swelling) are better.   Keep all doctor visits as told.  GET HELP RIGHT AWAY IF:   Yellowish-white fluid (pus) comes from the wound.   Medicine does not lessen your pain.   There is a red streak going away from the wound.   You cannot move your finger or toe.   You have a fever.  MAKE SURE YOU:   Understand these instructions.   Will watch your condition.   Will get help right away if you are not doing well or get worse.  Document Released: 03/11/2008 Document Revised: 05/22/2011 Document Reviewed: 10/06/2010 [facility] Patient Information 2012 [email or web address] A bulb drain is a small, plastic reservoir which creates a gentle suction. It is used to remove excess fluid from a surgical wound. The color and amount of fluid will vary. Immediately after surgery, the fluid is bright red. It may gradually change to a yellow color. When the amount decreases to about 1 or 2 tablespoons (15 to 30 cc) per 24 hours, your caregiver will usually remove it. DAILY CARE  Keep the bulb compressed at all times, except while emptying it. The compression creates suction.   Keep sites where the  tubes enter the skin dry and covered with a light bandage (dressing).   Tape the tubes to your skin, 1 to 2 inches below the insertion sites, to keep from pulling on your stitches. Tubes are stitched in place and will not slip out.   Pin the bulb to your shirt (not to your pants) with a safety pin.   For the first few days after surgery, there usually is more fluid in the bulb. Empty the bulb whenever it becomes half full because the bulb does not create enough suction if it is too full. Include this amount in your 24 hour totals.   When the amount of drainage decreases, empty the bulb at the same time every day. Write down the amounts and the 24 hour totals. Your caregiver will want to know them. This helps your caregiver know when the tubes can be removed.   Once you are home, it is no longer necessary to strip the tubes as may have been done in the hospital. If there is drainage around the tube sites, change dressings and keep the area dry. If you see a clot in the tube, leave it alone. However, if the tube does not appear to be draining, let your caregiver know.  TO EMPTY THE BULB  Open the stopper to release suction.   Holding the  stopper out of the way, pour drainage into the measuring cup that was sent home with you.   Measure and write down the amount. If there are 2 bulbs, note the amount of drainage from bulb 1 or bulb 2 and keep the totals separate. Your caregiver will want to know which tube is draining more.   Compress the bulb by folding it in half.   Replace the stopper.   Check the tape that holds the tube to your skin, and pin the bulb to your shirt.  SEEK MEDICAL CARE IF:  The drainage develops a bad odor.   You have an oral temperature above 102 F (38.9 C).   The amount of drainage from your wound suddenly increases or decreases.   You accidentally pull out your drain.   You have any other questions or concerns.  MAKE SURE YOU:   Understand these  instructions.   Will watch your condition.   Will get help right away if you are not doing well or get worse.  Document Released: 05/30/2000 Document Revised: 05/22/2011 Document Reviewed: 06/02/2005 [facility] Patient Information 2012 [place].Delayed Wound Closure Sometimes, your caregiver may decide to delay closing a wound for several days. This is done when the wound is badly bruised, dirty, or when it has been too long since the injury. By delaying the closure of your cut, the risk of infection is reduced. Wounds that are closed in 3 to 7 days after being cleaned up and dressed heal just as well as those that are closed right away. Rest and elevate the injured area until all the pain and swelling are gone.  SEEK MEDICAL CARE IF:  You develop unusual swelling or redness around the wound.   You have increasing pain or tenderness.   There is a bad smelling fluid (drainage) coming from the wound.  Have your wound checked as instructed by your caregiver. Document Released: 06/02/2005 Document Revised: 05/22/2011 Document Reviewed: 11/11/2006 [facility] Patient Information 2012 [place], [place].

## 2011-09-26 NOTE — Progress Notes (Signed)
Called Dr. Migdalia Dk to report patient with temperature of 102. With orders to just monitor.

## 2011-09-29 ENCOUNTER — Other Ambulatory Visit: Payer: Self-pay | Admitting: Physician Assistant

## 2011-10-15 ENCOUNTER — Encounter (HOSPITAL_BASED_OUTPATIENT_CLINIC_OR_DEPARTMENT_OTHER): Payer: Medicare Other | Attending: Plastic Surgery

## 2011-10-15 DIAGNOSIS — L98499 Non-pressure chronic ulcer of skin of other sites with unspecified severity: Secondary | ICD-10-CM | POA: Insufficient documentation

## 2011-10-15 DIAGNOSIS — Z09 Encounter for follow-up examination after completed treatment for conditions other than malignant neoplasm: Secondary | ICD-10-CM | POA: Insufficient documentation

## 2011-10-16 NOTE — Progress Notes (Signed)
[facility]  NAME:  CHANTRICE, BOHNEN                ACCOUNT NO.:  [id number]  MEDICAL RECORD NO.:  [id number]      DATE OF BIRTH:  1940-10-30  PHYSICIAN:  Theodoro Kos, DO       VISIT DATE:  10/15/2011                                  OFFICE VISIT   The patient is a 71 year old female who is here for followup after repair of her abdominal ulcer with excision and closure.  Overall, she is doing extremely well.  The incision is not red.  There is no inflammation, but she does have some purulent drainage from the drain tube, which is not present.  I think that to take it out would be a mistake; however, leaving it in certainly increases her risk of further infection.  I am going to continue her on the Cipro.  I am going to have her to use Silvercel around the drain area and then have her follow up in 1 week.  She is aware to call me if anything changes at all and is aware that we may need to go back to the operating room and open and clean her, and re-close her.  This is certainly a possibility, although we will keep a close eye on her.     Theodoro Kos, DO     CS/MEDQ  D:  10/15/2011  T:  10/15/2011  Job:  [id number]

## 2011-10-22 ENCOUNTER — Other Ambulatory Visit: Payer: Self-pay | Admitting: Physician Assistant

## 2011-10-23 ENCOUNTER — Telehealth: Payer: Self-pay

## 2011-10-23 ENCOUNTER — Other Ambulatory Visit: Payer: Self-pay | Admitting: Physician Assistant

## 2011-10-23 NOTE — Telephone Encounter (Signed)
Pt calling about rx refills - states she contacted [facility] about refills and hasnt heard back. Recently had surgery and needs rx's for pain. States happy to rtc if necessary for refills, just needs to know asap b/c she is in pain.  Alprazolam - has a few left Hydrocodone - out  [place]

## 2011-10-23 NOTE — Telephone Encounter (Signed)
We did not rx norco

## 2011-10-23 NOTE — Telephone Encounter (Signed)
Notified pt that we are [place] both meds and faxed them to [facility].

## 2011-10-23 NOTE — Progress Notes (Signed)
[facility]  NAME:  Teresa [name], Teresa [name]                ACCOUNT NO.:  [id number]  MEDICAL RECORD NO.:  [id number]      DATE OF BIRTH:  [birth year]  PHYSICIAN:  Theodoro Kos, DO            VISIT DATE:                                  OFFICE VISIT   The patient is a 71 year old female here for followup on her abdominal wound.  The drainage has decreased in its thickness and in the amount per day.  She denies any fever, increasing pain, chills and overall states she is feeling pretty good.  There is no increase in redness around the drain or incision site, and the incision site does not have any redness.  The drain was removed, and she is to use triple antibiotic ointment on the incision and the drain site.  We had a long discussion about the possibility of needing to open her up and irrigate if the infection does not resolve, and she acknowledges understanding of that.     Theodoro Kos, DO     CS/MEDQ  D:  10/22/2011  T:  10/23/2011  Job:  [id number]

## 2011-10-29 NOTE — Brief Op Note (Signed)
09/25/2011 - 09/26/2011  2:58 PM  PATIENT:  [name]  71 y.o. female  PRE-OPERATIVE DIAGNOSIS:  NON HEALING ABDOMINAL WOUND  POST-OPERATIVE DIAGNOSIS:  NON HEALING ABDOMINAL WOUND  PROCEDURE:  Procedure(s) (LRB):DEBRIDEMENT PRIMARY CLOSURE/ABDOMINAL WOUND (N/A)  SURGEON:  Surgeon(s) and Role:    * Ellyana Crigler Sanger, DO - Primary  PHYSICIAN ASSISTANT:   ASSISTANTS: none   ANESTHESIA:   general  EBL:     BLOOD ADMINISTERED:none  DRAINS: (2) Jackson-Pratt drain(s) with closed bulb suction in the abdominal wall space   LOCAL MEDICATIONS USED:  NONE  SPECIMEN:  No Specimen  DISPOSITION OF SPECIMEN:  N/A  COUNTS:  YES  TOURNIQUET:  * No tourniquets in log *  DICTATION: dictated  PLAN OF CARE: Discharge to home after PACU  PATIENT DISPOSITION:  PACU - hemodynamically stable.   Delay start of Pharmacological VTE agent (>24hrs) due to surgical blood loss or risk of bleeding: no

## 2011-10-30 NOTE — Progress Notes (Signed)
[facility]  NAME:  Teresa [name], Teresa [name]                ACCOUNT NO.:  [id number]  MEDICAL RECORD NO.:  [id number]      DATE OF BIRTH:  [birth year]  PHYSICIAN:  Theodoro Kos, DO            VISIT DATE:                                  OFFICE VISIT   The patient is a 71 year old female who is here for followup after undergoing excision of an abdominal ulcer with primary closure.  She is having some purulent drainage from her drain and that was improving, which confirmed the drainage.  Today, she looks really good.  There are no signs of infection or drainage.  The incision site is clean and dry. I removed the rest of the stitches.  If she has any further drainage, she knows to let us know.  If she has fever, redness, or pain, we want know that immediately.  I am going to have her continue the antibiotics for 1 more week, and I would like to see her back in 3 weeks.     Theodoro Kos, DO     CS/MEDQ  D:  10/29/2011  T:  10/30/2011  Job:  [id number]

## 2011-10-31 NOTE — Op Note (Signed)
Teresa [name], Teresa [name]                ACCOUNT NO.:  [id number]  MEDICAL RECORD NO.:  [id number]  LOCATION:                               FACILITY:  [facility]  PHYSICIAN:  Theodoro Kos, DO      DATE OF BIRTH:  20-May-1941  DATE OF PROCEDURE:  09/25/2011 DATE OF DISCHARGE:                              OPERATIVE REPORT   PREOPERATIVE DIAGNOSIS:  Abdominal ulcer.  POSTOPERATIVE DIAGNOSIS:  Abdominal ulcer.  PROCEDURE:  Excision of abdominal ulcer 6 x 3 cm with primary closure after undermining.  ATTENDING:  Theodoro Kos, DO.  ANESTHESIA:  General.  INDICATION FOR PROCEDURE:  The patient is a 71 year old female who had abdominal bowel rupture and underwent a complicated course with closure by secondary intention and hernia formation.  She presents for closure. Long discussions were undertaken with General Surgery who opted to wait to repair the hernia.  Local care was tried, but an extended period of time before any progress was noted, the decision was made to take her to the OR for an attempt at primary closure.  Risks and complications were reviewed including bleeding pain, scar, recurrent herniation, infection and risk of the anesthesia.  Consent was confirmed and signed.  DESCRIPTION OF PROCEDURE:  The patient was taken to the operating room, placed on the operating room table in supine position.  General anesthesia was administered.  Once adequate, a time-out was called.  All information was confirmed to be correct.  The patient was prepped and draped in the usual sterile fashion.  A 15 blade was used to make an elliptical incision at the edge of the ulceration excising the ulcer completely.  The Bovie was used to obtain hemostasis and dissect down to the abdominal wall.  The area was undermined approximately 4 cm in all direction in order to obtain closure without tension, and the pocket was irrigated with antibiotic solution.  There was one area on the right upper portion that  had a weak fascia with a hole and this was reinforced with 3-0 Vicryl.  Two drains were placed, one on the left and one on the right, and tacked to the abdominal wall with 4-0 silk.  The deep layers were closed with 3-0 Vicryl, followed by 4-0 Vicryl, and 5-0 Monocryl was used to reapproximate the skin with additional vertical mattress nylon sutures.  The patient tolerated the procedure well.  There were no complications.  She was awoken and taken to recovery room in stable condition.     Theodoro Kos, DO    CS/MEDQ  D:  10/30/2011  T:  10/31/2011  Job:  [id number]

## 2011-11-03 NOTE — Op Note (Signed)
Teresa [name], Teresa [name]                ACCOUNT NO.:  [id number]  MEDICAL RECORD NO.:  [id number]  LOCATION:                               FACILITY:  [facility]  PHYSICIAN:  Theodoro Kos, DO      DATE OF BIRTH:  [birth year]  DATE OF PROCEDURE:  09/25/2011 DATE OF DISCHARGE:                              OPERATIVE REPORT   PREOPERATIVE DIAGNOSIS:  Abdominal wound.  POSTOPERATIVE DIAGNOSIS:  Abdominal wound.  PROCEDURE:  Excision of abdominal wound, 10 cm x 6 cm with primary closure.  ATTENDING:  Theodoro Kos, DO.  ANESTHESIA:  General.  INDICATION FOR PROCEDURE:  The patient is a 71 year old female who had a ruptured bowel with a prolonged recovery and then developed a hernia. The bowel is not showing at present.  She just has the open wound. There is no sign of infection.  The decision was made to bring her to the operating room for excision of the ulcer area with attempt to primarily close it.  We discussed this with General Surgery.  She did not want to address the hernia at this time and wanted to be sure that she was going to recover from the primary closure.  Consent was signed. Risks and complications were reviewed and included bleeding, pain, scar, risk of anesthesia, infection, and recurrent and possible worsening of her hernia.  DESCRIPTION OF PROCEDURE:  The patient was taken to the operating room and placed on the operating table in supine position.  General anesthesia was administered.  Once adequate, a time-out was called.  All information was confirmed to be correct.  She was prepped and draped in usual sterile fashion.  A 15-blade was used to make an incision around the ulcerated area.  A 10-blade was used to excise the ulcer.  The hernia was noted with tear in the anterior fascia.  This was reinforced with 3-0 Vicryl.  A 3 cm of undermining was done in all directions in order to decrease the tension, and obtained the closure.  The pocket was irrigated with antibiotic  solution and warm saline.  Hemostasis was achieved with electrocautery.  The deep layers were closed with 3-0 Vicryl followed by 4-0 Vicryl, and a 5-0 Monocryl was used to close the skin.  The patient tolerated the procedure well.  There were no complications.  Two drains were placed, one on each side and secured to the abdominal wall with 4-0 silk.  A breast binder was placed around the abdomen to help with postoperative pain management.  She tolerated the procedure well.  There were no complications.  She was awoken and taken to recovery room in stable condition.    Theodoro Kos, DO    CS/MEDQ  D:  10/29/2011  T:  11/01/2011  Job:  [id number]

## 2011-11-11 ENCOUNTER — Other Ambulatory Visit: Payer: Self-pay | Admitting: Physician Assistant

## 2011-11-11 ENCOUNTER — Other Ambulatory Visit: Payer: Self-pay | Admitting: Internal Medicine

## 2011-11-12 NOTE — Telephone Encounter (Signed)
I see that we've refilled her pain maeds before, but I'm unclear why we did, rather than her surgeon, since her pain is post-operative.  And now, I'm concerned that she is still having pain, and needs re-evaluation.  Please get additional details.

## 2011-11-13 ENCOUNTER — Other Ambulatory Visit: Payer: Self-pay | Admitting: Cardiology

## 2011-11-14 ENCOUNTER — Telehealth: Payer: Self-pay

## 2011-11-14 MED ORDER — HYDROCODONE-ACETAMINOPHEN 5-500 MG PO TABS: 1.0000 | ORAL_TABLET | Freq: Four times a day (QID) | ORAL | Status: DC | PRN

## 2011-11-14 NOTE — Telephone Encounter (Signed)
Pt states she has contacted pharmacy multiple times for refill on her hydrocodone and states they have told her they contacted [place] with no response. Pt had surgery last month and developed infection so she has had to 'occasionally' take an extra pill during the day. States she will run out on Sunday. Requests refill asap.  [place] Pt best: [phone]

## 2011-11-14 NOTE — Telephone Encounter (Signed)
Done

## 2011-11-15 NOTE — Telephone Encounter (Signed)
Pt states she will make an appt to be seen for follow up.  Notified that rx has been faxed in

## 2011-11-19 ENCOUNTER — Encounter (HOSPITAL_BASED_OUTPATIENT_CLINIC_OR_DEPARTMENT_OTHER): Payer: Medicare Other | Attending: Plastic Surgery

## 2011-11-19 ENCOUNTER — Other Ambulatory Visit: Payer: Self-pay | Admitting: Physician Assistant

## 2011-11-19 DIAGNOSIS — Z09 Encounter for follow-up examination after completed treatment for conditions other than malignant neoplasm: Secondary | ICD-10-CM | POA: Insufficient documentation

## 2011-11-19 DIAGNOSIS — L98499 Non-pressure chronic ulcer of skin of other sites with unspecified severity: Secondary | ICD-10-CM | POA: Insufficient documentation

## 2011-11-19 MED ORDER — ALPRAZOLAM 0.25 MG PO TABS: 0.2500 mg | ORAL_TABLET | Freq: Three times a day (TID) | ORAL | Status: DC | PRN

## 2011-11-21 ENCOUNTER — Encounter: Payer: Self-pay | Admitting: Cardiology

## 2011-11-24 NOTE — Progress Notes (Signed)
[facility]  NAME:  Teresa [name], Teresa [name]                ACCOUNT NO.:  [id number]  MEDICAL RECORD NO.:  [id number]      DATE OF BIRTH:  1941-06-10  PHYSICIAN:  Theodoro Kos, DO       VISIT DATE:  11/24/2011                                  OFFICE VISIT   Teresa [name] is a 71 year old female who is here for follow up on her abdominal wound.  She has been using Silvercel on the area.  She has one tiny little area on the inferior portion of the incision that is opened up.  It is the size of a backside of the Q-tip and it tracks down a little bit, but there does not seemed to be any purulence or infection. There is no redness.  It is nontender.  This maybe just a superficial opening.  There has been no change in her medications or social history.  PHYSICAL EXAMINATION:  She is alert, oriented, cooperative, not in any acute distress.  She is pleasant.  Pupils equal.  Extraocular muscles are intact.  No cervical lymphadenopathy.  Her breathing is unlabored. Her heart is regular.  The wound is as described above.  We will continue with Silvercel and have her follow up in 1 week.     Lyndee Leo Sanger, DO     CS/MEDQ  D:  11/24/2011  T:  11/24/2011  Job:  [id number]

## 2011-11-28 ENCOUNTER — Ambulatory Visit (INDEPENDENT_AMBULATORY_CARE_PROVIDER_SITE_OTHER): Payer: Medicare Other | Admitting: Family Medicine

## 2011-11-28 ENCOUNTER — Encounter: Payer: Self-pay | Admitting: Family Medicine

## 2011-11-28 VITALS — BP 105/58 | HR 65 | Temp 97.4°F | Resp 16 | Ht 62.75 in | Wt 156.4 lb

## 2011-11-28 DIAGNOSIS — M949 Disorder of cartilage, unspecified: Secondary | ICD-10-CM

## 2011-11-28 DIAGNOSIS — G8929 Other chronic pain: Secondary | ICD-10-CM

## 2011-11-28 DIAGNOSIS — D649 Anemia, unspecified: Secondary | ICD-10-CM

## 2011-11-28 DIAGNOSIS — M1990 Unspecified osteoarthritis, unspecified site: Secondary | ICD-10-CM

## 2011-11-28 DIAGNOSIS — M129 Arthropathy, unspecified: Secondary | ICD-10-CM

## 2011-11-28 DIAGNOSIS — E039 Hypothyroidism, unspecified: Secondary | ICD-10-CM

## 2011-11-28 DIAGNOSIS — M898X9 Other specified disorders of bone, unspecified site: Secondary | ICD-10-CM

## 2011-11-28 DIAGNOSIS — Z780 Asymptomatic menopausal state: Secondary | ICD-10-CM

## 2011-11-28 DIAGNOSIS — Z1231 Encounter for screening mammogram for malignant neoplasm of breast: Secondary | ICD-10-CM

## 2011-11-28 DIAGNOSIS — R252 Cramp and spasm: Secondary | ICD-10-CM

## 2011-11-28 LAB — CBC
Hemoglobin: 11.5 g/dL — ABNORMAL LOW (ref 12.0–15.0)
MCH: 28.6 pg (ref 26.0–34.0)
MCHC: 32 g/dL (ref 30.0–36.0)
Platelets: 267 10*3/uL (ref 150–400)
RDW: 15 % (ref 11.5–15.5)

## 2011-11-28 MED ORDER — HYDROCODONE-ACETAMINOPHEN 5-500 MG PO TABS: 1.0000 | ORAL_TABLET | Freq: Four times a day (QID) | ORAL | Status: DC | PRN

## 2011-11-28 NOTE — Patient Instructions (Addendum)
Hypothyroidism The thyroid is a large gland located in the lower front of your neck. The thyroid gland helps control metabolism. Metabolism is how your body handles food. It controls metabolism with the hormone thyroxine. When this gland is underactive (hypothyroid), it produces too little hormone.  CAUSES These include:   Absence or destruction of thyroid tissue.   Goiter due to iodine deficiency.   Goiter due to medications.   Congenital defects (since birth).   Problems with the pituitary. This causes a lack of TSH (thyroid stimulating hormone). This hormone tells the thyroid to turn out more hormone.  SYMPTOMS  Lethargy (feeling as though you have no energy)   Cold intolerance   Weight gain (in spite of normal food intake)   Dry skin   Coarse hair   Menstrual irregularity (if severe, may lead to infertility)   Slowing of thought processes  Cardiac problems are also caused by insufficient amounts of thyroid hormone. Hypothyroidism in the newborn is cretinism, and is an extreme form. It is important that this form be treated adequately and immediately or it will lead rapidly to retarded physical and mental development. DIAGNOSIS  To prove hypothyroidism, your caregiver may do blood tests and ultrasound tests. Sometimes the signs are hidden. It may be necessary for your caregiver to watch this illness with blood tests either before or after diagnosis and treatment. TREATMENT  Low levels of thyroid hormone are increased by using synthetic thyroid hormone. This is a safe, effective treatment. It usually takes about four weeks to gain the full effects of the medication. After you have the full effect of the medication, it will generally take another four weeks for problems to leave. Your caregiver may start you on low doses. If you have had heart problems the dose may be gradually increased. It is generally not an emergency to get rapidly to normal. HOME CARE INSTRUCTIONS   Take  your medications as your caregiver suggests. Let your caregiver know of any medications you are taking or start taking. Your caregiver will help you with dosage schedules.   As your condition improves, your dosage needs may increase. It will be necessary to have continuing blood tests as suggested by your caregiver.   Report all suspected medication side effects to your caregiver.  SEEK MEDICAL CARE IF: Seek medical care if you develop:  Sweating.   Tremulousness (tremors).   Anxiety.   Rapid weight loss.   Heat intolerance.   Emotional swings.   Diarrhea.   Weakness.  SEEK IMMEDIATE MEDICAL CARE IF:  You develop chest pain, an irregular heart beat (palpitations), or a rapid heart beat. MAKE SURE YOU:   Understand these instructions.   Will watch your condition.   Will get help right away if you are not doing well or get worse.  Document Released: 06/02/2005 Document Revised: 05/22/2011 Document Reviewed: 01/21/2008 [facility] Patient Information 2012 [employer], [place]     Vitamin D Deficiency  Not having enough vitamin D is called a deficiency. Your body needs this vitamin to keep your bones strong and healthy. Having too little of it can make your bones soft or can cause other health problems.  HOME CARE  Take all vitamins, herbs, or nutrition drinks (supplements) as told by your doctor.   Have your blood tested 2 months after taking vitamins, herbs, or nutrition drinks.   Eat foods that have vitamin D. This includes:   Dairy products, cereals, or juices with added vitamin D. Check the label.  Fatty fish like salmon or trout.   Eggs.   Oysters.   Go outside for 10 to 15 minutes when the sun is shining. Do this 3 times a week. Do not do this if you have skin cancer.   Do not use tanning beds.   Stay at a healthy weight. Lose weight if needed.   Keep all doctor visits as told.  GET HELP IF:  You have questions.   You continue to have problems.   You  feel sick to your stomach (nauseous) or throw up (vomit).   You cannot go poop (constipated).   You feel confused.   You have severe belly (abdominal) or back pain.  MAKE SURE YOU:  Understand these instructions.   Will watch your condition.   Will get help right away if you are not doing well or get worse.  Document Released: 05/22/2011 Document Reviewed: 05/20/2011 [facility] Patient Information 2012 [place].Marland Kitchen      Chronic Pain Chronic pain can be defined as pain that is lasting, off and on, and lasts for 3 to 6 months or longer. Many things cause chronic pain, which can make it difficult to make a discrete diagnosis. There are many treatment options available for chronic pain. However, finding a treatment that works well for you may require trying various approaches until a suitable one is found. CAUSES  In some types of chronic medical conditions, the pain is caused by a normal pain response within the body. A normal pain response helps the body identify illness or injury and prevent further damage from being done. In these cases, the cause of the pain may be identified and treated, even if it may not be cured completely. Examples of chronic conditions which can cause chronic pain include:  Inflammation of the joints (arthritis).   Back pain or neck pain (including bulging or herniated disks).   Migraine headaches.   Cancer.  In some other types of chronic pain syndromes, the pain is caused by an abnormal pain response within the body. An abnormal pain response is present when there is no ongoing cause (or stimulus) for the pain, or when the cause of the pain is arising from the nerves or nervous system itself. Examples of conditions which can cause chronic pain due to an abnormal pain response include:  Fibromyalgia.   Reflex sympathetic dystrophy (RSD).   Neuropathy (when the nerves themselves are damaged, and may cause pain).  DIAGNOSIS  Your caregiver will help  diagnose your condition over time. In many cases, the initial focus will be on excluding conditions that could be causing the pain. Depending on your symptoms, your caregiver may order some tests to diagnose your condition. Some of these tests include:  Blood tests.   Computerized X-ray scans (CT scan).   Computerized magnetic scans (MRI).   X-rays.   Ultrasounds.   Nerve conduction studies.   Consultation with other physicians or specialists.  TREATMENT  There are many treatment options for people suffering from chronic pain. Finding a treatment that works well may take time.   You may be referred to a pain management specialist.   You may be put on medication to help with the pain. Unfortunately, some medications (such as opiate medications) may not be very effective in cases where chronic pain is due to abnormal pain responses. Finding the right medications can take some time.   Adjunctive therapies may be used to provide additional relief and improve a patient's quality of life. These  therapies include:   Mindfulness meditation.   Acupuncture.   Biofeedback.   Cognitive-behavioral therapy.   In certain cases, surgical interventions may be attempted.  HOME CARE INSTRUCTIONS   Make sure you understand these instructions prior to discharge.   Ask any questions and share any further concerns you have with your caregiver prior to discharge.   Take all medications as directed by your caregiver.   Keep all follow-up appointments.  SEEK MEDICAL CARE IF:   Your pain gets worse.   You develop a new pain that was not present before.   You cannot tolerate any medications prescribed by your caregiver.   You develop new symptoms since your last visit with your caregiver.  SEEK IMMEDIATE MEDICAL CARE IF:   You develop muscular weakness.   You have decreased sensation or numbness.   You lose control of bowel or bladder function.   Your pain suddenly gets much worse.    You have an oral temperature above 102 F (38.9 C), not controlled by medication.   You develop shaking chills, confusion, chest pain, or shortness of breath.  Document Released: 02/22/2002 Document Revised: 05/22/2011 Document Reviewed: 05/31/2008 [facility] Patient Information 2012 [place].

## 2011-11-29 LAB — TSH: TSH: 3.962 u[IU]/mL (ref 0.350–4.500)

## 2011-11-29 LAB — T4, FREE: Free T4: 0.85 ng/dL (ref 0.80–1.80)

## 2011-11-29 LAB — BASIC METABOLIC PANEL
Calcium: 9.5 mg/dL (ref 8.4–10.5)
Chloride: 104 mEq/L (ref 96–112)
Creat: 0.93 mg/dL (ref 0.50–1.10)

## 2011-11-29 LAB — VITAMIN D 25 HYDROXY (VIT D DEFICIENCY, FRACTURES): Vit D, 25-Hydroxy: 28 ng/mL — ABNORMAL LOW (ref 30–89)

## 2011-11-29 LAB — RHEUMATOID FACTOR: Rhuematoid fact SerPl-aCnc: <10 IU/mL (ref <=14)

## 2011-12-02 ENCOUNTER — Encounter: Payer: Self-pay | Admitting: Family Medicine

## 2011-12-02 NOTE — Progress Notes (Signed)
[facility]  NAME:  Teresa [name], Teresa [name]                ACCOUNT NO.:  [id number]  MEDICAL RECORD NO.:  [id number]      DATE OF BIRTH:  1940-10-30  PHYSICIAN:  Theodoro Kos, DO       VISIT DATE:  12/01/2011                                  OFFICE VISIT   The patient is a 71 year old female who is here for followup after abdominal ulcer repair by primary closure.  She has actually done extremely well.  There is one small area on the inferior portion of the wound that opened up.  We have been using Silvercel on the area.  It is healing in.  It got a little larger in its circumference but the depth has improved.  I used little silver nitrate to help with hypergranulation today and we will continue with the Silvercel.  It does not appear to be infected and if we need to, we can try reexcision and closure, possibly even ACell.  In the meantime, she should continue with increasing her protein in taking her multivitamin.     Theodoro Kos, DO     CS/MEDQ  D:  12/01/2011  T:  12/02/2011  Job:  [id number]

## 2011-12-02 NOTE — Progress Notes (Signed)
Quick Note:  Please call pt and advise that the following labs are abnormal... Thyroid tests are in the normal range so you are taking the appropriate dose of Thyroid medication. Anemia numbers are stable. Try to eat more iron-rich foods. Potassium is normal so decrease dose of OTC K+ supplement.  Vitamin D level is a little low so get OTC Vitamin D3 2000 IU and take one capsule daily; also need to get some sun exposure most days of the week.   Otherwise, labs look very good.  Copy to pt. ______

## 2011-12-02 NOTE — Progress Notes (Signed)
  Subjective:    Patient ID: Teresa [name], female    DOB: [birth year], 71 y.o.   MRN: [id number]  HPI  This 71 y.o Cauc female is her for follow-up (last seen at [facility] in March). She c/o joint pain  involving hands and knees, muscle cramps and abdominal distention. She has been also under care  of surgeon who has been managing closure of abdominal wound after complicated surgery for SBO.  Medication changes of significance: K+ was discontinued but she is now taking OTC K+ supplement.  She has improved appetite with weight gain.  Review of Systems  Constitutional: Positive for fatigue and unexpected weight change. Negative for fever, chills, activity change and appetite change.  Respiratory: Negative for cough, chest tightness and shortness of breath.   Cardiovascular: Negative for chest pain, palpitations and leg swelling.  Gastrointestinal: Positive for abdominal distention. Negative for nausea, abdominal pain, diarrhea, constipation and blood in stool.  Musculoskeletal: Positive for joint swelling and arthralgias. Negative for gait problem.  Skin: Negative.   Neurological: Negative.        Objective:   Physical Exam  Nursing note and vitals reviewed. Constitutional: She is oriented to person, place, and time. She appears well-developed and well-nourished. No distress.  HENT:  Head: Normocephalic and atraumatic.  Eyes: Conjunctivae and EOM are normal. No scleral icterus.  Cardiovascular: Normal rate.   Pulmonary/Chest: Effort normal. No respiratory distress.  Abdominal: She exhibits distension.       Healing midline scar with small area covered with bandage  Musculoskeletal: She exhibits edema and tenderness.       Hands: digits with deg changes and ulnar deviation at MCP joints. Mild erythema at MCP joints of index digits and 2nd MCP joints bilaterally. Knees: Left is swollen with deg changes  Neurological: She is alert and oriented to person, place, and time. No cranial nerve  deficit. Coordination normal.  Skin: Skin is warm and dry.  Psychiatric: She has a normal mood and affect. Her behavior is normal. Thought content normal.    LABS:  Hgb-  Nov 2012= 9.24 May 2011= 10.19 August 2011= 11.2      Assessment & Plan:   1. Anemia - improving Hgb over last 6 months CBC  2. Hypothyroidism - Sept TSH= 0.393 TSH, T4, Free  3. Muscle cramps - problems with K+ Basic metabolic panel  4. Bone pain  Vitamin D, 25-hydroxy, DG Bone Density  5. Arthritis  Rheumatoid factor, DG Bone Density  6. Chronic pain  Narcotic is prescribed by another specialist  7. Other screening mammogram  MM Digital Screening

## 2011-12-03 ENCOUNTER — Encounter: Payer: Self-pay | Admitting: Nurse Practitioner

## 2011-12-03 ENCOUNTER — Ambulatory Visit (INDEPENDENT_AMBULATORY_CARE_PROVIDER_SITE_OTHER): Payer: Medicare Other | Admitting: Nurse Practitioner

## 2011-12-03 VITALS — BP 132/68 | HR 81 | Ht 63.0 in | Wt 157.2 lb

## 2011-12-03 DIAGNOSIS — I5020 Unspecified systolic (congestive) heart failure: Secondary | ICD-10-CM

## 2011-12-03 DIAGNOSIS — R0609 Other forms of dyspnea: Secondary | ICD-10-CM

## 2011-12-03 DIAGNOSIS — I428 Other cardiomyopathies: Secondary | ICD-10-CM

## 2011-12-03 DIAGNOSIS — R0600 Dyspnea, unspecified: Secondary | ICD-10-CM

## 2011-12-03 DIAGNOSIS — R0989 Other specified symptoms and signs involving the circulatory and respiratory systems: Secondary | ICD-10-CM

## 2011-12-03 LAB — BRAIN NATRIURETIC PEPTIDE: Pro B Natriuretic peptide (BNP): 158 pg/mL — ABNORMAL HIGH (ref 0.0–100.0)

## 2011-12-03 NOTE — Patient Instructions (Addendum)
We are going to check your fluid level test today  We are going to update your echo  I will see you back next week to discuss  Minimize the salt  Same medicines for now.  Call the [facility] office at [phone] if you have any questions, problems or concerns.

## 2011-12-03 NOTE — Assessment & Plan Note (Signed)
She presents with weight gain, progressive DOE and edema. We will check a BNP level today. We will go ahead and recheck her echo. Hopefully her EF has not dropped back. I will see her back next week for further discussion and up titration of her medicines if indicated. Patient is agreeable to this plan and will call if any problems develop in the interim.

## 2011-12-03 NOTE — Progress Notes (Signed)
Teresa [name] Date of Birth: Sep 07, 1940 Medical Record [id number]  History of Present Illness: Teresa [name] is seen today for a work in visit. She is seen for Dr. Percival Spanish. She has a nonischemic CM. Past EF was down to 25%. She had her medicines maximized and last echo back in September showed her EF to be up to almost 50%. Plans for her ICD were cancelled. Her other problems include past tobacco abuse, HTN, PVC's, DM, valvular heart disease, spinal stenosis and glaucoma. She was hospitalized back in November with small bowel obstruction. She required surgery and had delay in wound healing. She is still going to the wound center.   She comes in today. She has not felt well over the past month or so. She is getting more DOE. She is getting more swelling. Weight went up 4 pounds overnight earlier this week. She has taken extra Lasix. No chest pain. Some palpitations and she has known PVC's. She is no longer smoking. While she was quite sick back in the fall, she says she did recover and has been doing very well until just the last month or so. She does use a little salt. Her usual weight is around 148. She has had recent labs but no BNP. Those results were reviewed with her. Her anemia (which I suspect was post op) continues to improve. She is on less ARB and Coreg due to her illness back in the fall. Blood pressure apparently got too low.   Current Outpatient Prescriptions on File Prior to Visit  Medication Sig Dispense Refill  . ALDACTONE 25 MG tablet TAKE (1/2) TABLET DAILY.  45 each  0  . ALPRAZolam (XANAX) 0.25 MG tablet Take 1 tablet (0.25 mg total) by mouth 3 (three) times daily as needed for sleep.  90 tablet  0  . COREG 12.5 MG tablet TAKE 1 TABLET TWICE DAILY WITH A MEAL.  60 each  6  . CVS ZINC PO Take 1 tablet by mouth daily.      Marland Kitchen esomeprazole (NEXIUM) 40 MG capsule Take 40 mg by mouth 3 (three) times a week. PT STATES TO EXPENSIVE TO TAKE DAILY      . HYDROcodone-acetaminophen (VICODIN)  5-500 MG per tablet Take 1 tablet by mouth every 6 (six) hours as needed for pain.  90 tablet  0  . LASIX 40 MG tablet TAKE 1 OR 2 TABLETS DAILY AS NEEDED.  60 each  3  . LEVOXYL 88 MCG tablet TAKE 1 TABLET ONCE DAILY.  30 each  1  . losartan (COZAAR) 25 MG tablet Take 25 mg by mouth daily.      . Multiple Vitamin (MULTIVITAMIN) tablet Take 1 tablet by mouth daily.      . naphazoline (CLEAR EYES) 0.012 % ophthalmic solution Place 1 drop into both eyes as needed. For blood shot eyes      . potassium chloride SA (K-DUR,KLOR-CON) 20 MEQ tablet Take 20 mEq by mouth daily.      . simethicone (MYLICON) 80 MG chewable tablet Chew 80 mg by mouth every 6 (six) hours as needed. Gas pain        Allergies  Allergen Reactions  . Aspirin Other (See Comments)    HX Bleeding Ulcer     Past Medical History  Diagnosis Date  . Nonischemic cardiomyopathy CARDIOLOGIST- [facility] - LAST VISIT  AUG 2012  (IN EPIC)    EF is 25% per echo February 2012, non ischemic myoview 12/2009.  EF 50% echo  7/12 and plans for ICD cancelled.   Marland Kitchen HTN (hypertension)   . PVC (premature ventricular contraction)   . Heart palpitations   . Valvular heart disease     Moderate to severe MR, moderate TR, LAE per echo 7/11  . Spinal stenosis   . Abdominal pain   . History of glaucoma   . Non-healing surgical wound ABDOMINAL S/P EXPLORATOY LAPAROTOMY 04-18-2011  . History of small bowel obstruction 2009 AND NOV 2012  . Peptic ulcer disease   . GERD (gastroesophageal reflux disease)   . H/O CHF   . Moderate mitral regurgitation   . Moderate tricuspid regurgitation   . Arthritis FEET, KNEES, HANDS  . H/O hiatal hernia   . Urge urinary incontinence   . Left ovarian cyst   . Diabetes mellitus type 2, diet-controlled     Past Surgical History  Procedure Date  . Tubal ligation   . Glaucoma surgery 1978  . Exploratomy lap. / extensive lysis adhesions/ small bowel resection x2 with primary anastomosis x2 04-18-2011    SMALL  BOWEL OBSTRUCTION  . Knee arthroscopy w/ meniscectomy 06-05-2004  . Transthoracic echocardiogram 12-27-2010    EF 45-50%/ MODERATE MV REGURG. / LEFT ATRIUM MILDLY DILATED/ MILD TRICUSPID REGURG.  . Cardiovascular stress test JULY 2011-  DR HOCHREIN    NO ISCHEMIA  . Thyroidectomy 1978 GOITOR    AND CERVICAL COLD KNIFE CONE BX  . Tonsillectomy AGE 50  . Benign right breast tumor removed   . Appendectomy 1979    RUPTURED  . Abdominal hernia repair 76 YRS AGO    PERITINITIS  . Ventral hernia repair X4  LAST ONE 20 YRS AGO    ABDOMINAL --  EACH ONE IN DIFFERENT AREA  . Wound debridement 09/25/2011    Procedure: DEBRIDEMENT CLOSURE/ABDOMINAL WOUND;  Surgeon: Theodoro Kos, DO;  Location: [facility];  Service: Plastics;  Laterality: N/A;  excision of abdominal wound with primary closure    History  Smoking status  . Former Smoker -- 48 years  . Types: Cigarettes  . Quit date: 09/22/2010  Smokeless tobacco  . Never Used    History  Alcohol Use No    Family History  Problem Relation Age of Onset  . Stroke Brother   . Stroke Sister   . Heart disease Sister     Review of Systems: The review of systems is per the HPI.  All other systems were reviewed and are negative.  Physical Exam: BP 132/68  Pulse 81  Ht 5\' 3"  (1.6 m)  Wt 157 lb 3.2 oz (71.305 kg)  BMI 27.85 kg/m2  SpO2 97% Patient is very pleasant and in no acute distress. Skin is warm and dry. Color is normal.  HEENT is unremarkable. Normocephalic/atraumatic. PERRL. Sclera are nonicteric. Neck is supple. No masses. No JVD. Lungs are clear. Cardiac exam shows a regular rate and rhythm. Occasional ectopic noted. Abdomen is soft. Extremities are with trace edema. Gait and ROM are intact. No gross neurologic deficits noted.   LABORATORY DATA: Lab Results  Component Value Date   WBC 8.5 11/28/2011   HGB 11.5* 11/28/2011   HCT 35.9* 11/28/2011   PLT 267 11/28/2011   GLUCOSE 85 11/28/2011   CHOL 251*  09/12/2010   TRIG 150.0* 09/12/2010   HDL 41.70 09/12/2010   LDLDIRECT 182.5 09/12/2010   ALT 6 05/27/2011   AST 20 05/27/2011   NA 137 11/28/2011   K 4.6 11/28/2011   CL 104 11/28/2011  CREATININE 0.93 11/28/2011   BUN 24* 11/28/2011   CO2 25 11/28/2011   TSH 3.962 11/28/2011   INR 1.05 05/27/2011   HGBA1C 5.7* 04/16/2011     Assessment / Plan:

## 2011-12-04 ENCOUNTER — Other Ambulatory Visit: Payer: Self-pay | Admitting: Family Medicine

## 2011-12-04 DIAGNOSIS — M1990 Unspecified osteoarthritis, unspecified site: Secondary | ICD-10-CM

## 2011-12-05 ENCOUNTER — Other Ambulatory Visit: Payer: Self-pay | Admitting: Physician Assistant

## 2011-12-08 ENCOUNTER — Other Ambulatory Visit: Payer: Self-pay | Admitting: Physician Assistant

## 2011-12-08 ENCOUNTER — Other Ambulatory Visit (HOSPITAL_COMMUNITY): Payer: Medicare Other

## 2011-12-09 ENCOUNTER — Ambulatory Visit (HOSPITAL_COMMUNITY): Payer: Medicare Other | Attending: Nurse Practitioner

## 2011-12-09 DIAGNOSIS — I080 Rheumatic disorders of both mitral and aortic valves: Secondary | ICD-10-CM | POA: Insufficient documentation

## 2011-12-09 DIAGNOSIS — M7989 Other specified soft tissue disorders: Secondary | ICD-10-CM | POA: Insufficient documentation

## 2011-12-09 DIAGNOSIS — R0609 Other forms of dyspnea: Secondary | ICD-10-CM | POA: Insufficient documentation

## 2011-12-09 DIAGNOSIS — E119 Type 2 diabetes mellitus without complications: Secondary | ICD-10-CM | POA: Insufficient documentation

## 2011-12-09 DIAGNOSIS — I428 Other cardiomyopathies: Secondary | ICD-10-CM

## 2011-12-09 DIAGNOSIS — I5020 Unspecified systolic (congestive) heart failure: Secondary | ICD-10-CM

## 2011-12-09 DIAGNOSIS — R0600 Dyspnea, unspecified: Secondary | ICD-10-CM

## 2011-12-09 DIAGNOSIS — R0989 Other specified symptoms and signs involving the circulatory and respiratory systems: Secondary | ICD-10-CM | POA: Insufficient documentation

## 2011-12-09 DIAGNOSIS — I079 Rheumatic tricuspid valve disease, unspecified: Secondary | ICD-10-CM | POA: Insufficient documentation

## 2011-12-09 DIAGNOSIS — I379 Nonrheumatic pulmonary valve disorder, unspecified: Secondary | ICD-10-CM | POA: Insufficient documentation

## 2011-12-09 DIAGNOSIS — Z87891 Personal history of nicotine dependence: Secondary | ICD-10-CM | POA: Insufficient documentation

## 2011-12-09 NOTE — Progress Notes (Signed)
[facility]  NAME:  Teresa [name], Teresa [name]                     ACCOUNT NO.:  MEDICAL RECORD NO.:  [id number]      DATE OF BIRTH:  [birth year]  PHYSICIAN:  Theodoro Kos, DO       VISIT DATE:  12/08/2011                                  OFFICE VISIT   The patient is a 71 year old female, who is here for followup on her abdominal ulcer.  She is doing overall very well.  She still has one area on the lower portion of the abdominal incision that has some hypergranulation tissue that tracks down.  There is no a significant amount of drainage noted.  MEDICATIONS:  Unchanged.  SOCIAL HISTORY:  Unchanged.  PHYSICAL EXAMINATION:  GENERAL:  She is alert, oriented, cooperative, pleasant like usual. HEENT:  Pupils are equal.  Extraocular muscles intact.  No cervical lymphadenopathy. LUNGS:  Breathing is unlabored. HEART:  Regular. ABDOMEN:  Soft.  Her wound is described above.  We will continue with the Silvercel and triple-antibiotic ointment and have her follow up in 1 week.     Theodoro Kos, DO     CS/MEDQ  D:  12/08/2011  T:  12/08/2011  Job:  [id number]

## 2011-12-09 NOTE — Progress Notes (Signed)
Echocardiogram performed.  

## 2011-12-11 NOTE — Addendum Note (Signed)
Addended by: Jannette Spanner on: 12/11/2011 09:05 AM   Modules accepted: Orders

## 2011-12-12 ENCOUNTER — Ambulatory Visit: Payer: Medicare Other | Admitting: Nurse Practitioner

## 2011-12-15 ENCOUNTER — Encounter (HOSPITAL_BASED_OUTPATIENT_CLINIC_OR_DEPARTMENT_OTHER): Payer: Medicare Other | Attending: Plastic Surgery

## 2011-12-15 DIAGNOSIS — L98499 Non-pressure chronic ulcer of skin of other sites with unspecified severity: Secondary | ICD-10-CM | POA: Insufficient documentation

## 2011-12-16 NOTE — Progress Notes (Signed)
[facility]  NAME:  PAVANI, PENNOYER                ACCOUNT NO.:  [id number]  MEDICAL RECORD NO.:  [id number]      DATE OF BIRTH:  06-Jul-1940  PHYSICIAN:  Theodoro Kos, DO       VISIT DATE:  12/15/2011                                  OFFICE VISIT   The patient is a 71 year old female who is here with her daughter for followup on her abdominal wound.  It is actually decreased in size particularly in the depth and is less tender.  She has been using SilverCel over the past week with very good improvement.  She denies any fevers and no other issues have come up for this week.  We will continue with the SilverCel and have her follow up in 1 week.     Theodoro Kos, DO     CS/MEDQ  D:  12/15/2011  T:  12/16/2011  Job:  [id number]

## 2011-12-17 ENCOUNTER — Other Ambulatory Visit: Payer: Self-pay | Admitting: Physician Assistant

## 2011-12-23 ENCOUNTER — Other Ambulatory Visit: Payer: Self-pay | Admitting: Family Medicine

## 2011-12-23 ENCOUNTER — Other Ambulatory Visit: Payer: Self-pay | Admitting: Physician Assistant

## 2011-12-23 MED ORDER — ALPRAZOLAM 0.25 MG PO TABS: 0.2500 mg | ORAL_TABLET | Freq: Three times a day (TID) | ORAL | Status: DC | PRN

## 2011-12-23 MED ORDER — HYDROCODONE-ACETAMINOPHEN 5-500 MG PO TABS: 1.0000 | ORAL_TABLET | Freq: Four times a day (QID) | ORAL | Status: DC | PRN

## 2011-12-23 NOTE — Progress Notes (Signed)
[facility]  NAME:  ARMONII, MARKEL                     ACCOUNT NO.:  MEDICAL RECORD NO.:  [id number]      DATE OF BIRTH:  1941-01-24  PHYSICIAN:  Theodoro Kos, DO       VISIT DATE:  12/22/2011                                  OFFICE VISIT   The patient is a 71 year old female who is here for followup on her abdominal ulcer.  She underwent excision with primary closure.  She has one little eraser-sized area that has not healed up yet and we have been using Silvercel on this.  It is not really draining, but today it seemed to track down to the abdominal wall.  She is otherwise doing well and there does not appear to be any infection.  She is alert, oriented, cooperative, not in any acute distress.  She is very pleasant.  There is no malodor or drainage.  Recommend packing it with Nu-Gauze wet- to-dry and then planning on taking her to the OR in the next few weeks if we do not see any change for excision and re-closure.  She acknowledges understanding and agreeing with the plan.     Theodoro Kos, DO     CS/MEDQ  D:  12/22/2011  T:  12/22/2011  Job:  [id number]

## 2011-12-30 NOTE — Progress Notes (Signed)
[facility]  NAME:  Teresa [name], Teresa [name]                ACCOUNT NO.:  [id number]  MEDICAL RECORD NO.:  [id number]      DATE OF BIRTH:  1940-12-11  PHYSICIAN:  Theodoro Kos, DO       VISIT DATE:  12/29/2011                                  OFFICE VISIT   The patient is a 71 year old female who is here for followup after abdominal ulcer, excision and debridement with primary closure.  She has been dealing with open wound that is actually getting better and less deep.  We will continue with collagen.  There is no change in her medications or social history.  On exam, she is alert, oriented, cooperative, not in any acute distress. The area does not look to be infected.  It does not have malodor or drainage.  We will see her back in 1 week.     Theodoro Kos, DO     CS/MEDQ  D:  12/29/2011  T:  12/30/2011  Job:  [id number]

## 2012-01-15 ENCOUNTER — Encounter (HOSPITAL_BASED_OUTPATIENT_CLINIC_OR_DEPARTMENT_OTHER): Admission: RE | Payer: Self-pay | Source: Ambulatory Visit

## 2012-01-15 ENCOUNTER — Ambulatory Visit (HOSPITAL_BASED_OUTPATIENT_CLINIC_OR_DEPARTMENT_OTHER): Admission: RE | Admit: 2012-01-15 | Payer: Medicare Other | Source: Ambulatory Visit | Admitting: Plastic Surgery

## 2012-01-15 ENCOUNTER — Other Ambulatory Visit: Payer: Self-pay | Admitting: Physician Assistant

## 2012-01-15 SURGERY — APPLICATION OF A-CELL OF CHEST/ABDOMEN
Anesthesia: General | Site: Abdomen

## 2012-01-15 NOTE — Telephone Encounter (Signed)
Patient requesting refills on Hydrocodone to [facility] please advise pt was dispensed #90 on 12/23/11

## 2012-01-15 NOTE — Telephone Encounter (Signed)
Here is the electronic refill request.

## 2012-01-17 ENCOUNTER — Other Ambulatory Visit: Payer: Self-pay | Admitting: Cardiology

## 2012-01-19 ENCOUNTER — Other Ambulatory Visit: Payer: Self-pay | Admitting: Physician Assistant

## 2012-01-19 ENCOUNTER — Other Ambulatory Visit: Payer: Self-pay | Admitting: Cardiology

## 2012-01-19 ENCOUNTER — Encounter (HOSPITAL_BASED_OUTPATIENT_CLINIC_OR_DEPARTMENT_OTHER): Payer: Medicare Other | Attending: Plastic Surgery

## 2012-01-19 DIAGNOSIS — L98499 Non-pressure chronic ulcer of skin of other sites with unspecified severity: Secondary | ICD-10-CM | POA: Insufficient documentation

## 2012-01-19 NOTE — Telephone Encounter (Signed)
..   Requested Prescriptions   Pending Prescriptions Disp Refills  . COREG 12.5 MG tablet [Pharmacy Med Name: COREG 12.5 MG TABLET] 60 each 10    Sig: TAKE 1 TABLET TWICE DAILY WITH A MEAL.

## 2012-01-22 ENCOUNTER — Ambulatory Visit: Payer: Medicare Other | Admitting: Nurse Practitioner

## 2012-01-23 ENCOUNTER — Other Ambulatory Visit: Payer: Self-pay | Admitting: Plastic Surgery

## 2012-01-23 ENCOUNTER — Encounter (HOSPITAL_BASED_OUTPATIENT_CLINIC_OR_DEPARTMENT_OTHER): Payer: Self-pay | Admitting: *Deleted

## 2012-01-23 NOTE — H&P (Signed)
Teresa [name] is an 71 y.o. female.   Chief Complaint: abdominal ulcer HPI: The patient is a 71 yrs old wf here for evaluation of an abdominal ulcer. She underwent excision with primary closure in the past with most of the area completely resolved.  There was one area at the base of the ulcer that opened up with granulation tissue present.  She has not has signs of infection or excessive drainage.  Past Medical History  Diagnosis Date  . Nonischemic cardiomyopathy CARDIOLOGIST- [facility] - LAST VISIT  AUG 2012  (IN EPIC)    EF is 25% per echo February 2012, non ischemic myoview 12/2009.  EF 50% echo 7/12 and plans for ICD cancelled.   Marland Kitchen HTN (hypertension)   . PVC (premature ventricular contraction)   . Heart palpitations   . Valvular heart disease     Moderate to severe MR, moderate TR, LAE per echo 7/11  . Spinal stenosis   . Abdominal pain   . History of glaucoma   . Non-healing surgical wound ABDOMINAL S/P EXPLORATOY LAPAROTOMY 04-18-2011  . History of small bowel obstruction 2009 AND NOV 2012  . Peptic ulcer disease   . GERD (gastroesophageal reflux disease)   . H/O CHF   . Moderate mitral regurgitation   . Moderate tricuspid regurgitation   . Arthritis FEET, KNEES, HANDS  . H/O hiatal hernia   . Urge urinary incontinence   . Left ovarian cyst   . Diabetes mellitus type 2, diet-controlled   . Shortness of breath   . Wears dentures     upper denture-lower partial    Past Surgical History  Procedure Date  . Tubal ligation   . Glaucoma surgery 1978  . Exploratomy lap. / extensive lysis adhesions/ small bowel resection x2 with primary anastomosis x2 04-18-2011    SMALL BOWEL OBSTRUCTION  . Knee arthroscopy w/ meniscectomy 06-05-2004  . Transthoracic echocardiogram 12-27-2010    EF 45-50%/ MODERATE MV REGURG. / LEFT ATRIUM MILDLY DILATED/ MILD TRICUSPID REGURG.  . Cardiovascular stress test JULY 2011-  DR HOCHREIN    NO ISCHEMIA  . Thyroidectomy 1978 GOITOR    AND  CERVICAL COLD KNIFE CONE BX  . Tonsillectomy AGE 12  . Benign right breast tumor removed   . Appendectomy 1979    RUPTURED  . Abdominal hernia repair 67 YRS AGO    PERITINITIS  . Ventral hernia repair X4  LAST ONE 20 YRS AGO    ABDOMINAL --  EACH ONE IN DIFFERENT AREA  . Wound debridement 09/25/2011    Procedure: DEBRIDEMENT CLOSURE/ABDOMINAL WOUND;  Surgeon: Theodoro Kos, DO;  Location: [place];  Service: Plastics;  Laterality: N/A;  excision of abdominal wound with primary closure    Family History  Problem Relation Age of Onset  . Stroke Brother   . Stroke Sister   . Heart disease Sister    Social History:  reports that she quit smoking about 16 months ago. Her smoking use included Cigarettes. She quit after 30 years of use. She has never used smokeless tobacco. She reports that she does not drink alcohol or use illicit drugs.  Allergies:  Allergies  Allergen Reactions  . Aspirin Other (See Comments)    HX Bleeding Ulcer      (Not in a hospital admission)  No results found for this or any previous visit (from the past 48 hour(s)). No results found.  Review of Systems  Constitutional: Negative.   HENT: Negative.   Eyes:  Negative.   Respiratory: Negative.   Cardiovascular: Negative.   Gastrointestinal: Negative.   Genitourinary: Negative.   Musculoskeletal: Negative.   Skin: Negative.   Neurological: Negative.   Endo/Heme/Allergies: Negative.   Psychiatric/Behavioral: Negative.     There were no vitals taken for this visit. Physical Exam  Constitutional: She appears well-developed and well-nourished.  HENT:  Head: Normocephalic.  Eyes: Conjunctivae and EOM are normal. Pupils are equal, round, and reactive to light.  Neck: Normal range of motion.  Cardiovascular: Normal rate.   Respiratory: Effort normal.  GI: Soft. She exhibits no distension. There is no tenderness.  Musculoskeletal: Normal range of motion.  Neurological: She is alert.    Skin: Skin is warm.  Psychiatric: She has a normal mood and affect. Her behavior is normal.     Assessment/Plan Abdominal ulcer - excision of the ulcer with placement of Acell and closure.  SANGER,CLAIRE 01/23/2012, 2:46 PM

## 2012-01-23 NOTE — Progress Notes (Signed)
Reviewed case with dr Albertina Parr prior to talking to pt. Looked at dr hochrein note 6/13 Pt is about the same-no more SOB-no problems since then Wound has been healing and almost closed- To come in for bmet- Take am meds with water-bring them dos- Did put in call to [place] NP that sees pt most of time-to let her know about surgery

## 2012-01-26 ENCOUNTER — Ambulatory Visit: Payer: Medicare Other | Admitting: Nurse Practitioner

## 2012-01-27 ENCOUNTER — Encounter (HOSPITAL_BASED_OUTPATIENT_CLINIC_OR_DEPARTMENT_OTHER)
Admission: RE | Admit: 2012-01-27 | Discharge: 2012-01-27 | Disposition: A | Discharge status: Home / Self Care | Payer: Medicare Other | Source: Ambulatory Visit | Attending: Plastic Surgery | Admitting: Plastic Surgery

## 2012-01-27 LAB — BASIC METABOLIC PANEL
CO2: 24 mEq/L (ref 19–32)
Calcium: 9.4 mg/dL (ref 8.4–10.5)
Chloride: 103 mEq/L (ref 96–112)
Glucose, Bld: 99 mg/dL (ref 70–99)
Potassium: 4.7 mEq/L (ref 3.5–5.1)
Sodium: 138 mEq/L (ref 135–145)

## 2012-01-28 NOTE — Progress Notes (Signed)
[facility]  NAME:  Teresa [name], Teresa [name]                     ACCOUNT NO.:  MEDICAL RECORD NO.:  [id number]      DATE OF BIRTH:  1940-08-19  PHYSICIAN:  Theodoro Kos, DO       VISIT DATE:  01/26/2012                                  OFFICE VISIT   The patient is a 71 year old female who is here for followup on her abdominal ulcer.  She underwent primary closure previously and did well for the majority of the wound.  She has a very small area on the anterior portion that opened up.  She has been using collagen on the area with good improvement, but it is now stalled out, and the decision was made to take her back to the OR for excision and an attempt at another primary closure.  We will continue with the collagen for this week and see her back after surgery.     Theodoro Kos, DO     CS/MEDQ  D:  01/26/2012  T:  01/27/2012  Job:  [id number]

## 2012-01-29 ENCOUNTER — Encounter (HOSPITAL_BASED_OUTPATIENT_CLINIC_OR_DEPARTMENT_OTHER): Payer: Self-pay | Admitting: Plastic Surgery

## 2012-01-29 ENCOUNTER — Ambulatory Visit (HOSPITAL_BASED_OUTPATIENT_CLINIC_OR_DEPARTMENT_OTHER): Payer: Medicare Other | Admitting: Anesthesiology

## 2012-01-29 ENCOUNTER — Encounter (HOSPITAL_BASED_OUTPATIENT_CLINIC_OR_DEPARTMENT_OTHER): Payer: Self-pay | Admitting: *Deleted

## 2012-01-29 ENCOUNTER — Encounter (HOSPITAL_BASED_OUTPATIENT_CLINIC_OR_DEPARTMENT_OTHER): Payer: Self-pay | Admitting: Anesthesiology

## 2012-01-29 ENCOUNTER — Encounter (HOSPITAL_BASED_OUTPATIENT_CLINIC_OR_DEPARTMENT_OTHER)
Admission: RE | Disposition: A | Discharge status: Home / Self Care | Payer: Self-pay | Source: Ambulatory Visit | Attending: Plastic Surgery

## 2012-01-29 ENCOUNTER — Ambulatory Visit (HOSPITAL_BASED_OUTPATIENT_CLINIC_OR_DEPARTMENT_OTHER)
Admission: RE | Admit: 2012-01-29 | Discharge: 2012-01-29 | Disposition: A | Discharge status: Home / Self Care | Payer: Medicare Other | Source: Ambulatory Visit | Attending: Plastic Surgery | Admitting: Plastic Surgery

## 2012-01-29 DIAGNOSIS — E119 Type 2 diabetes mellitus without complications: Secondary | ICD-10-CM | POA: Insufficient documentation

## 2012-01-29 DIAGNOSIS — I428 Other cardiomyopathies: Secondary | ICD-10-CM | POA: Insufficient documentation

## 2012-01-29 DIAGNOSIS — K219 Gastro-esophageal reflux disease without esophagitis: Secondary | ICD-10-CM | POA: Insufficient documentation

## 2012-01-29 DIAGNOSIS — L98499 Non-pressure chronic ulcer of skin of other sites with unspecified severity: Secondary | ICD-10-CM | POA: Diagnosis present

## 2012-01-29 DIAGNOSIS — I10 Essential (primary) hypertension: Secondary | ICD-10-CM | POA: Insufficient documentation

## 2012-01-29 DIAGNOSIS — I059 Rheumatic mitral valve disease, unspecified: Secondary | ICD-10-CM | POA: Insufficient documentation

## 2012-01-29 DIAGNOSIS — K439 Ventral hernia without obstruction or gangrene: Secondary | ICD-10-CM | POA: Insufficient documentation

## 2012-01-29 DIAGNOSIS — Y849 Medical procedure, unspecified as the cause of abnormal reaction of the patient, or of later complication, without mention of misadventure at the time of the procedure: Secondary | ICD-10-CM | POA: Insufficient documentation

## 2012-01-29 DIAGNOSIS — I079 Rheumatic tricuspid valve disease, unspecified: Secondary | ICD-10-CM | POA: Insufficient documentation

## 2012-01-29 DIAGNOSIS — Z01812 Encounter for preprocedural laboratory examination: Secondary | ICD-10-CM | POA: Insufficient documentation

## 2012-01-29 DIAGNOSIS — T8189XA Other complications of procedures, not elsewhere classified, initial encounter: Secondary | ICD-10-CM | POA: Insufficient documentation

## 2012-01-29 HISTORY — DX: Shortness of breath: R06.02

## 2012-01-29 HISTORY — DX: Presence of dental prosthetic device (complete) (partial): Z97.2

## 2012-01-29 HISTORY — PX: VENTRAL HERNIA REPAIR: SHX424

## 2012-01-29 LAB — POCT HEMOGLOBIN-HEMACUE: Hemoglobin: 11.3 g/dL — ABNORMAL LOW (ref 12.0–15.0)

## 2012-01-29 SURGERY — DEBRIDEMENT, WOUND, WITH CLOSURE
Anesthesia: General | Site: Abdomen | Wound class: Clean Contaminated

## 2012-01-29 MED ORDER — SODIUM CHLORIDE 0.9 % IR SOLN
Status: DC | PRN
  Administered 2012-01-29: 14:00:00

## 2012-01-29 MED ORDER — LACTATED RINGERS IV SOLN
INTRAVENOUS | Status: DC
  Administered 2012-01-29: 13:00:00 via INTRAVENOUS

## 2012-01-29 MED ORDER — ACETAMINOPHEN 10 MG/ML IV SOLN
1000.0000 mg | Freq: Once | INTRAVENOUS | Status: AC
  Administered 2012-01-29: 1000 mg via INTRAVENOUS

## 2012-01-29 MED ORDER — FENTANYL CITRATE 0.05 MG/ML IJ SOLN
INTRAMUSCULAR | Status: DC | PRN
  Administered 2012-01-29: 50 ug via INTRAVENOUS

## 2012-01-29 MED ORDER — ATROPINE SULFATE 0.4 MG/ML IJ SOLN
INTRAMUSCULAR | Status: DC | PRN
  Administered 2012-01-29: 0.4 mg via INTRAVENOUS

## 2012-01-29 MED ORDER — DEXAMETHASONE SODIUM PHOSPHATE 4 MG/ML IJ SOLN
INTRAMUSCULAR | Status: DC | PRN
  Administered 2012-01-29: 5 mg via INTRAVENOUS

## 2012-01-29 MED ORDER — PROPOFOL 10 MG/ML IV EMUL
INTRAVENOUS | Status: DC | PRN
  Administered 2012-01-29: 150 mg via INTRAVENOUS

## 2012-01-29 MED ORDER — LIDOCAINE HCL (CARDIAC) 20 MG/ML IV SOLN
INTRAVENOUS | Status: DC | PRN
  Administered 2012-01-29: 50 mg via INTRAVENOUS

## 2012-01-29 MED ORDER — CEFAZOLIN SODIUM-DEXTROSE 2-3 GM-% IV SOLR
2.0000 g | INTRAVENOUS | Status: AC
  Administered 2012-01-29: 2 g via INTRAVENOUS

## 2012-01-29 SURGICAL SUPPLY — 46 items
ADH SKN CLS APL DERMABOND .7 (GAUZE/BANDAGES/DRESSINGS) ×1
APPLICATOR COTTON TIP 6IN STRL (MISCELLANEOUS) ×1 IMPLANT
BAG DECANTER FOR FLEXI CONT (MISCELLANEOUS) ×1 IMPLANT
BINDER ABD UNIV 9 30-45 (GAUZE/BANDAGES/DRESSINGS) IMPLANT
BINDER ABDOMINAL 9 (GAUZE/BANDAGES/DRESSINGS) ×2
BLADE HEX COATED 2.75 (ELECTRODE) ×2 IMPLANT
BLADE SURG 10 STRL SS (BLADE) ×1 IMPLANT
BLADE SURG 15 STRL LF DISP TIS (BLADE) ×2 IMPLANT
BLADE SURG 15 STRL SS (BLADE) ×2
CLOTH BEACON ORANGE TIMEOUT ST (SAFETY) ×2 IMPLANT
COVER MAYO STAND STRL (DRAPES) ×2 IMPLANT
COVER TABLE BACK 60X90 (DRAPES) ×2 IMPLANT
DECANTER SPIKE VIAL GLASS SM (MISCELLANEOUS) ×3 IMPLANT
DERMABOND ADVANCED (GAUZE/BANDAGES/DRESSINGS) ×1
DERMABOND ADVANCED .7 DNX12 (GAUZE/BANDAGES/DRESSINGS) IMPLANT
DRAPE LAPAROSCOPIC ABDOMINAL (DRAPES) ×1 IMPLANT
DRSG EMULSION OIL 3X3 NADH (GAUZE/BANDAGES/DRESSINGS) IMPLANT
DRSG PAD ABDOMINAL 8X10 ST (GAUZE/BANDAGES/DRESSINGS) ×1 IMPLANT
ELECT REM PT RETURN 9FT ADLT (ELECTROSURGICAL) ×2
ELECTRODE REM PT RTRN 9FT ADLT (ELECTROSURGICAL) ×1 IMPLANT
GAUZE SPONGE 4X4 12PLY STRL LF (GAUZE/BANDAGES/DRESSINGS) ×1 IMPLANT
GLOVE BIO SURGEON STRL SZ 6.5 (GLOVE) ×3 IMPLANT
GLOVE BIOGEL M 7.0 STRL (GLOVE) ×1 IMPLANT
GOWN PREVENTION PLUS XLARGE (GOWN DISPOSABLE) ×4 IMPLANT
NS IRRIG 1000ML POUR BTL (IV SOLUTION) ×2 IMPLANT
PACK BASIN DAY SURGERY FS (CUSTOM PROCEDURE TRAY) ×2 IMPLANT
PENCIL BUTTON HOLSTER BLD 10FT (ELECTRODE) ×2 IMPLANT
SHEET MEDIUM DRAPE 40X70 STRL (DRAPES) ×1 IMPLANT
SLEEVE SCD COMPRESS KNEE MED (MISCELLANEOUS) ×2 IMPLANT
SPONGE GAUZE 4X4 12PLY (GAUZE/BANDAGES/DRESSINGS) ×1 IMPLANT
SPONGE LAP 18X18 X RAY DECT (DISPOSABLE) ×4 IMPLANT
SUT MNCRL AB 4-0 PS2 18 (SUTURE) ×1 IMPLANT
SUT MON AB 5-0 PS2 18 (SUTURE) ×1 IMPLANT
SUT PDS AB 2-0 CT2 27 (SUTURE) ×1 IMPLANT
SUT PDS AB 3-0 PS2 18 (SUTURE) ×1 IMPLANT
SUT SILK 3 0 PS 1 (SUTURE) IMPLANT
SWAB CULTURE LIQ STUART DBL (MISCELLANEOUS) IMPLANT
SYR BULB 3OZ (MISCELLANEOUS) ×1 IMPLANT
SYR CONTROL 10ML LL (SYRINGE) ×1 IMPLANT
TOWEL OR 17X24 6PK STRL BLUE (TOWEL DISPOSABLE) ×4 IMPLANT
TRAY DSU PREP LF (CUSTOM PROCEDURE TRAY) ×2 IMPLANT
TUBE ANAEROBIC SPECIMEN COL (MISCELLANEOUS) IMPLANT
TUBE CONNECTING 20X1/4 (TUBING) ×2 IMPLANT
UNDERPAD 30X30 INCONTINENT (UNDERPADS AND DIAPERS) ×2 IMPLANT
WATER STERILE IRR 1000ML POUR (IV SOLUTION) ×2 IMPLANT
YANKAUER SUCT BULB TIP NO VENT (SUCTIONS) ×2 IMPLANT

## 2012-01-29 NOTE — Interval H&P Note (Signed)
History and Physical Interval Note:  01/29/2012 1:21 PM  Teresa [name]  has presented today for surgery, with the diagnosis of abdominal skin ulcer  The various methods of treatment have been discussed with the patient and family. After consideration of risks, benefits and other options for treatment, the patient has consented to  Procedure(s) (LRB): DEBRIDEMENT AND CLOSURE WOUND (N/A) as a surgical intervention .  The patient's history has been reviewed, patient examined, no change in status, stable for surgery.  I have reviewed the patient's chart and labs.  Questions were answered to the patient's satisfaction.     SANGER,Alegra Rost

## 2012-01-29 NOTE — H&P (View-Only) (Signed)
Teresa [name] is an 71 y.o. female.   Chief Complaint: abdominal ulcer HPI: The patient is a 71 yrs old wf here for evaluation of an abdominal ulcer. She underwent excision with primary closure in the past with most of the area completely resolved.  There was one area at the base of the ulcer that opened up with granulation tissue present.  She has not has signs of infection or excessive drainage.  Past Medical History  Diagnosis Date  . Nonischemic cardiomyopathy CARDIOLOGIST- DR Teresa [name] - LAST VISIT  AUG 2012  (IN EPIC)    EF is 25% per echo February 2012, non ischemic myoview 12/2009.  EF 50% echo 7/12 and plans for ICD cancelled.   Marland Kitchen HTN (hypertension)   . PVC (premature ventricular contraction)   . Heart palpitations   . Valvular heart disease     Moderate to severe MR, moderate TR, LAE per echo 7/11  . Spinal stenosis   . Abdominal pain   . History of glaucoma   . Non-healing surgical wound ABDOMINAL S/P EXPLORATOY LAPAROTOMY 04-18-2011  . History of small bowel obstruction 2009 AND NOV 2012  . Peptic ulcer disease   . GERD (gastroesophageal reflux disease)   . H/O CHF   . Moderate mitral regurgitation   . Moderate tricuspid regurgitation   . Arthritis FEET, KNEES, HANDS  . H/O hiatal hernia   . Urge urinary incontinence   . Left ovarian cyst   . Diabetes mellitus type 2, diet-controlled   . Shortness of breath   . Wears dentures     upper denture-lower partial    Past Surgical History  Procedure Date  . Tubal ligation   . Glaucoma surgery 1978  . Exploratomy lap. / extensive lysis adhesions/ small bowel resection x2 with primary anastomosis x2 04-18-2011    SMALL BOWEL OBSTRUCTION  . Knee arthroscopy w/ meniscectomy 06-05-2004  . Transthoracic echocardiogram 12-27-2010    EF 45-50%/ MODERATE MV REGURG. / LEFT ATRIUM MILDLY DILATED/ MILD TRICUSPID REGURG.  . Cardiovascular stress test JULY 2011-  DR HOCHREIN    NO ISCHEMIA  . Thyroidectomy 1978 GOITOR    AND  CERVICAL COLD KNIFE CONE BX  . Tonsillectomy AGE 47  . Benign right breast tumor removed   . Appendectomy 1979    RUPTURED  . Abdominal hernia repair 39 YRS AGO    PERITINITIS  . Ventral hernia repair X4  LAST ONE 20 YRS AGO    ABDOMINAL --  EACH ONE IN DIFFERENT AREA  . Wound debridement 09/25/2011    Procedure: DEBRIDEMENT CLOSURE/ABDOMINAL WOUND;  Surgeon: Teresa Kos, DO;  Location: [place];  Service: Plastics;  Laterality: N/A;  excision of abdominal wound with primary closure    Family History  Problem Relation Age of Onset  . Stroke Brother   . Stroke Sister   . Heart disease Sister    Social History:  reports that she quit smoking about 16 months ago. Her smoking use included Cigarettes. She quit after 30 years of use. She has never used smokeless tobacco. She reports that she does not drink alcohol or use illicit drugs.  Allergies:  Allergies  Allergen Reactions  . Aspirin Other (See Comments)    HX Bleeding Ulcer      (Not in a hospital admission)  No results found for this or any previous visit (from the past 48 hour(s)). No results found.  Review of Systems  Constitutional: Negative.   HENT: Negative.   Eyes:  Negative.   Respiratory: Negative.   Cardiovascular: Negative.   Gastrointestinal: Negative.   Genitourinary: Negative.   Musculoskeletal: Negative.   Skin: Negative.   Neurological: Negative.   Endo/Heme/Allergies: Negative.   Psychiatric/Behavioral: Negative.     There were no vitals taken for this visit. Physical Exam  Constitutional: She appears well-developed and well-nourished.  HENT:  Head: Normocephalic.  Eyes: Conjunctivae and EOM are normal. Pupils are equal, round, and reactive to light.  Neck: Normal range of motion.  Cardiovascular: Normal rate.   Respiratory: Effort normal.  GI: Soft. She exhibits no distension. There is no tenderness.  Musculoskeletal: Normal range of motion.  Neurological: She is alert.    Skin: Skin is warm.  Psychiatric: She has a normal mood and affect. Her behavior is normal.     Assessment/Plan Abdominal ulcer - excision of the ulcer with placement of Acell and closure.  [name],[name] 01/23/2012, 2:46 PM

## 2012-01-29 NOTE — Brief Op Note (Signed)
01/29/2012  2:34 PM  PATIENT:  Teresa [name]  71 y.o. female  PRE-OPERATIVE DIAGNOSIS:  abdominal skin ulcer  POST-OPERATIVE DIAGNOSIS:  abdominal skin ulcer, ventral hernia repair  PROCEDURE:  Procedure(s) (LRB): DEBRIDEMENT AND CLOSURE ABDOMINAL ULCER WITH VENTRAL HERNIA REPAIR  SURGEON:  Surgeon(s) and Role:    * Claire Sanger, DO - Primary  PHYSICIAN ASSISTANT: Shawn Rayburn  ASSISTANTS: none   ANESTHESIA:   general  EBL:  Total I/O In: 700 [I.V.:700] Out: -   BLOOD ADMINISTERED:none  DRAINS: none   LOCAL MEDICATIONS USED:  NONE  SPECIMEN:  No Specimen  DISPOSITION OF SPECIMEN:  N/A  COUNTS:  YES  TOURNIQUET:  * No tourniquets in log *  DICTATION: dictated  PLAN OF CARE: Discharge to home after PACU  PATIENT DISPOSITION:  PACU - hemodynamically stable.   Delay start of Pharmacological VTE agent (>24hrs) due to surgical blood loss or risk of bleeding: no

## 2012-01-29 NOTE — Anesthesia Postprocedure Evaluation (Signed)
Anesthesia Post Note  Patient: Teresa [name]  Procedure(s) Performed: Procedure(s) (LRB): DEBRIDEMENT AND CLOSURE WOUND (N/A) HERNIA REPAIR VENTRAL ADULT (N/A)  Anesthesia type: General  Patient location: PACU  Post pain: Pain level controlled  Post assessment: Patient's Cardiovascular Status Stable  Last Vitals:  Filed Vitals:   01/29/12 1515  BP: 124/49  Pulse: 44  Temp:   Resp: 18    Post vital signs: Reviewed and stable  Level of consciousness: alert  Complications: No apparent anesthesia complications

## 2012-01-29 NOTE — Anesthesia Preprocedure Evaluation (Signed)
Anesthesia Evaluation  Patient identified by MRN, date of birth, ID band Patient awake    Reviewed: Allergy & Precautions, H&P , NPO status , Patient's Chart, lab work & pertinent test results, reviewed documented beta blocker date and time   Airway Mallampati: II TM Distance: >3 FB Neck ROM: full    Dental   Pulmonary shortness of breath and with exertion,  breath sounds clear to auscultation        Cardiovascular hypertension, Pt. on medications and Pt. on home beta blockers + dysrhythmias + Valvular Problems/Murmurs MR Rhythm:regular     Neuro/Psych negative neurological ROS  negative psych ROS   GI/Hepatic Neg liver ROS, hiatal hernia, PUD, GERD-  Medicated and Controlled,  Endo/Other  Hypothyroidism   Renal/GU Renal InsufficiencyRenal disease  negative genitourinary   Musculoskeletal   Abdominal   Peds  Hematology negative hematology ROS (+)   Anesthesia Other Findings See surgeon's H&P   Reproductive/Obstetrics negative OB ROS                           Anesthesia Physical Anesthesia Plan  ASA: III  Anesthesia Plan: General   Post-op Pain Management:    Induction: Intravenous  Airway Management Planned: LMA  Additional Equipment:   Intra-op Plan:   Post-operative Plan: Extubation in OR  Informed Consent: I have reviewed the patients History and Physical, chart, labs and discussed the procedure including the risks, benefits and alternatives for the proposed anesthesia with the patient or authorized representative who has indicated his/her understanding and acceptance.   Dental Advisory Given  Plan Discussed with: CRNA and Surgeon  Anesthesia Plan Comments:         Anesthesia Quick Evaluation

## 2012-01-29 NOTE — Transfer of Care (Signed)
Immediate Anesthesia Transfer of Care Note  Patient: Teresa [name]  Procedure(s) Performed: Procedure(s) (LRB): DEBRIDEMENT AND CLOSURE WOUND (N/A) HERNIA REPAIR VENTRAL ADULT (N/A)  Patient Location: PACU  Anesthesia Type: General  Level of Consciousness: awake, alert  and oriented  Airway & Oxygen Therapy: Patient Spontanous Breathing and Patient connected to face mask oxygen  Post-op Assessment: Report given to PACU RN and Post -op Vital signs reviewed and stable  Post vital signs: Reviewed and stable  Complications: No apparent anesthesia complications

## 2012-01-30 NOTE — Op Note (Signed)
NAMEJUSTI, DEMBY NO.:  [id number]  MEDICAL RECORD NO.:  [id number]  LOCATION:[facility]   [facility]  PHYSICIAN:  Theodoro Kos, DO      DATE OF BIRTH:  04/23/1941  DATE OF PROCEDURE:  01/29/2012 DATE OF DISCHARGE:                              OPERATIVE REPORT   PREOPERATIVE DIAGNOSIS:  Abdominal skin ulcer.  POSTOPERATIVE DIAGNOSIS:  Abdominal skin ulcer with ventral hernia.  PROCEDURE:  Excision of abdominal skin ulcer skin and subcutaneous tissue with primary closure and closure of a ventral hernia.  ATTENDING SURGEON:  Theodoro Kos, DO.  ASSISTANT:  Shawn Rayburn, P.A.  ANESTHESIA:  General.  INDICATION FOR PROCEDURE:  The patient is a 71 year old who had an extremely large abdominal wound and underwent debridement with primary closure in the past.  She had a very small area of the inferior portion of her wound that was nonhealing.  Decision was made to bring her back to the OR for debridement and closure.  Risks and complications were reviewed and discussed in detail and included bleeding, pain, scar, risk of anesthesia.  Consent was signed and confirmed.  DESCRIPTION OF PROCEDURE:  The patient was taken to the operating room, placed on the operating room table in a supine position.  General anesthesia was administered.  Once adequate, a time-out was called.  All information was confirmed to be correct.  She was prepped and draped in usual sterile fashion.  An elliptical incision was made around the opening at the previous scar site in order to include the ulceration.  A #15 blade was used to cut down through the skin and then scissors were used to cut around the opening.  Q-Tips were placed in there, so I could follow the track.  Once I got past the soft tissue, it was apparent that it was a ventral hernia, and therefore the skin and subcutaneous tissue were removed and the bowel was released from the just  underneath the sac and then the sac was closed with 2 and 3-0 PDS with a running stitch and a couple of interrupted stitches.  Small amount of plication was done to strengthen it, but the sides were very weak.  The deep layers were then closed with 4-0 Monocryl followed by 5-0 Monocryl.  Dermabond was applied.  The patient tolerated the procedure well.  There were no complications.  She was awoken and taken to recovery room in stable condition, and an abdominal binder was applied.     Theodoro Kos, DO     CS/MEDQ  D:  01/29/2012  T:  01/30/2012  Job:  [id number]

## 2012-02-02 ENCOUNTER — Encounter (HOSPITAL_BASED_OUTPATIENT_CLINIC_OR_DEPARTMENT_OTHER): Payer: Self-pay | Admitting: Plastic Surgery

## 2012-02-03 ENCOUNTER — Encounter (HOSPITAL_BASED_OUTPATIENT_CLINIC_OR_DEPARTMENT_OTHER): Payer: Self-pay

## 2012-02-04 ENCOUNTER — Ambulatory Visit (INDEPENDENT_AMBULATORY_CARE_PROVIDER_SITE_OTHER): Payer: Medicare Other | Admitting: General Surgery

## 2012-02-04 ENCOUNTER — Encounter (INDEPENDENT_AMBULATORY_CARE_PROVIDER_SITE_OTHER): Payer: Self-pay | Admitting: General Surgery

## 2012-02-04 VITALS — BP 132/84 | HR 80 | Temp 98.4°F | Ht 63.5 in | Wt 159.2 lb

## 2012-02-04 DIAGNOSIS — K432 Incisional hernia without obstruction or gangrene: Secondary | ICD-10-CM

## 2012-02-04 MED ORDER — CIPROFLOXACIN HCL 500 MG PO TABS: 500.0000 mg | ORAL_TABLET | Freq: Two times a day (BID) | ORAL | Status: AC

## 2012-02-04 NOTE — Progress Notes (Signed)
Prescription for Ciprofloxacin 500mg , called to [facility] at patient request.  Spoke with Brooke @[facility] (Cipro 500 mg, take 1 tablet po,  Bid, #20, 0 refills)

## 2012-02-04 NOTE — Progress Notes (Signed)
Subjective:     Patient ID: Teresa [name], female   DOB: 04/24/1941, 71 y.o.   MRN: [id number]  HPI This patient is well-known to me since last fall. She had an exploratory laparotomy with small bowel resection for a perforated an obstructed bowel. 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 She eventually had closure of her skin by Dr. Migdalia Dk and 6 days ago had repeat skin closure for a nonhealing ulcer at the lower portion of her wound. At the time, Dr. Migdalia Dk noted some small bowel underneath the skin and concern for ventral hernia she referred her back to me for evaluation. She does have a sensation of bulging in her midline and both upper and lower aspects but she states that her bowels are functioning and has no tract symptoms. She has some mild discomfort but certainly not the discomfort that she had similar to her prior procedure. She also has some redness inner skin in the area of her recent procedure but no fevers and chills. She recently finished a course of Cipro.  Review of Systems     Objective:   Physical Exam No distress and nontoxic-appearing sitting currently on the bed Her incision is well healed and there is no drainage but she does have some blanching erythema and warmth in the area of the incision concerning for possible early wound infection and cellulitis. She does not have a single definite bulge but she does have weakness in her whole central abdomen the most likely has a fairly large central defect.    Assessment:     Cellulitis and possible early wound infection I discussed the case with Dr. Migdalia Dk and she recommended that I refill the patient's Cipro and she will evaluate her on Friday. There is no drainage and I do not see any bulge in the area and I do not have much concern for strangulated hernia at this time. She is much nontender in this area and again, I do not appreciate any bulge  underneath the incision. Ventral hernia I do suspect that she has a fairly large central incisional hernia given her fascial dehiscence postoperatively. This was an expected outcome and I would be very surprised if she does not have a hernia. There is no evidence of any incarceration or strangulation at this time she has no obstructive symptoms. I have recommended CT scan of the abdomen for further evaluation so we can plan possible hernia repair electively. However, I would like to see that her wound is completely healed and that she has no evidence of infection from her other recent procedures. This will likely be a difficult hernia repair since she has had a prior ventral hernia repair with mesh.    Plan:     CT scan of the abdomen and pelvis and she will follow up after this Refill Cipro and follow up with Dr. Migdalia Dk for evaluation of her wound and possible wound infection.

## 2012-02-05 ENCOUNTER — Other Ambulatory Visit: Payer: Self-pay | Admitting: Family Medicine

## 2012-02-05 MED ORDER — HYDROCODONE-ACETAMINOPHEN 5-500 MG PO TABS: 1.0000 | ORAL_TABLET | Freq: Four times a day (QID) | ORAL | Status: DC | PRN

## 2012-02-06 ENCOUNTER — Ambulatory Visit
Admission: RE | Admit: 2012-02-06 | Discharge: 2012-02-06 | Disposition: A | Discharge status: Home / Self Care | Payer: Medicare Other | Source: Ambulatory Visit | Attending: General Surgery | Admitting: General Surgery

## 2012-02-06 DIAGNOSIS — K432 Incisional hernia without obstruction or gangrene: Secondary | ICD-10-CM

## 2012-02-06 MED ORDER — IOHEXOL 300 MG/ML  SOLN
100.0000 mL | Freq: Once | INTRAMUSCULAR | Status: AC | PRN
  Administered 2012-02-06: 100 mL via INTRAVENOUS

## 2012-02-09 ENCOUNTER — Telehealth (INDEPENDENT_AMBULATORY_CARE_PROVIDER_SITE_OTHER): Payer: Self-pay

## 2012-02-09 NOTE — Telephone Encounter (Signed)
Return call to Teresa [name] regarding her CT results:  she wanted to make sure the results were available during her follow up w/Dr. Migdalia Dk on 02/10/12.  Called the office, per Dara they have access to [facility] and results are available.

## 2012-02-10 DIAGNOSIS — K439 Ventral hernia without obstruction or gangrene: Secondary | ICD-10-CM | POA: Insufficient documentation

## 2012-02-19 ENCOUNTER — Other Ambulatory Visit: Payer: Self-pay | Admitting: Family Medicine

## 2012-02-19 MED ORDER — HYDROCODONE-ACETAMINOPHEN 5-500 MG PO TABS: 1.0000 | ORAL_TABLET | Freq: Four times a day (QID) | ORAL | Status: DC | PRN

## 2012-02-20 ENCOUNTER — Other Ambulatory Visit: Payer: Self-pay | Admitting: Family Medicine

## 2012-02-20 MED ORDER — ALPRAZOLAM 0.25 MG PO TABS: 0.2500 mg | ORAL_TABLET | Freq: Three times a day (TID) | ORAL | Status: DC | PRN

## 2012-02-23 ENCOUNTER — Encounter (HOSPITAL_BASED_OUTPATIENT_CLINIC_OR_DEPARTMENT_OTHER): Payer: Medicare Other | Attending: Plastic Surgery

## 2012-02-23 DIAGNOSIS — L98499 Non-pressure chronic ulcer of skin of other sites with unspecified severity: Secondary | ICD-10-CM | POA: Insufficient documentation

## 2012-02-24 NOTE — Progress Notes (Signed)
[facility]  NAME:  NUSAIBAH, HEIDEL                ACCOUNT NO.:  [id number]  MEDICAL RECORD NO.:  [id number]      DATE OF BIRTH:  Feb 20, 1941  PHYSICIAN:  Theodoro Kos, DO       VISIT DATE:  02/23/2012                                  OFFICE VISIT   The patient is a 71 year old female who is here for followup on her abdominal ulcer.  She had ACell placed a week ago and has shown remarkable improvement with a decrease in the overall depth and size of the wound.  We will continue with the Hydrogel and have her follow up in 1 week.     Theodoro Kos, DO     CS/MEDQ  D:  02/23/2012  T:  02/24/2012  Job:  [id number]

## 2012-02-26 ENCOUNTER — Other Ambulatory Visit: Payer: Self-pay | Admitting: Cardiology

## 2012-02-27 NOTE — Telephone Encounter (Signed)
..   Requested Prescriptions   Pending Prescriptions Disp Refills  . COZAAR 25 MG tablet [Pharmacy Med Name: COZAAR 25 MG TABLET] 30 tablet 3    Sig: TAKE 1 TABLET DAILY.  Marland Kitchen ALDACTONE 25 MG tablet [Pharmacy Med Name: ALDACTONE 25 MG TABLET] 45 tablet 2    Sig: TAKE (1/2) TABLET DAILY.  Marland Kitchen.Patient needs to contact office to schedule  Appointment  for future refills.Ph:[phone]. Thank you.

## 2012-03-02 NOTE — Progress Notes (Signed)
[facility]  NAME:  CHYVONNE, LUEKE                ACCOUNT NO.:  [id number]  MEDICAL RECORD NO.:  [id number]      DATE OF BIRTH:  12/02/1940  PHYSICIAN:  Theodoro Kos, DO       VISIT DATE:  03/01/2012                                  OFFICE VISIT   The patient is a 71 year old female who is here for followup on her abdominal ulcer.  She has been putting some Hydrogel on the area with some improvement over the last week.  It just about flush with the skin. Now, we just needed to epithelialize.  We will have her continue with the dressing change every couple of days and we will see her back in a week.     Theodoro Kos, DO     CS/MEDQ  D:  03/01/2012  T:  03/02/2012  Job:  [id number]

## 2012-03-05 ENCOUNTER — Ambulatory Visit (INDEPENDENT_AMBULATORY_CARE_PROVIDER_SITE_OTHER): Payer: Medicare Other | Admitting: Family Medicine

## 2012-03-05 ENCOUNTER — Encounter: Payer: Self-pay | Admitting: Family Medicine

## 2012-03-05 VITALS — BP 138/72 | HR 83 | Temp 97.6°F | Resp 17 | Ht 63.0 in | Wt 161.6 lb

## 2012-03-05 DIAGNOSIS — E039 Hypothyroidism, unspecified: Secondary | ICD-10-CM

## 2012-03-05 DIAGNOSIS — R5381 Other malaise: Secondary | ICD-10-CM

## 2012-03-05 DIAGNOSIS — R0609 Other forms of dyspnea: Secondary | ICD-10-CM

## 2012-03-05 DIAGNOSIS — R5383 Other fatigue: Secondary | ICD-10-CM

## 2012-03-05 DIAGNOSIS — E559 Vitamin D deficiency, unspecified: Secondary | ICD-10-CM

## 2012-03-05 DIAGNOSIS — R0989 Other specified symptoms and signs involving the circulatory and respiratory systems: Secondary | ICD-10-CM

## 2012-03-05 NOTE — Patient Instructions (Addendum)

## 2012-03-05 NOTE — Progress Notes (Addendum)
  Subjective:    Patient ID: Teresa [name], female    DOB: Aug 23, 1940, 71 y.o.   MRN: [id number]  HPI  This 71 y.o. Cauc female has multiple chronic medical problems. She is here today to review  her medications/ get refills. She is recuperating from incisional hernia surgery and has a chronic problem  with poor wound healing. She has weekly General Surgery visits. She has been  fatigued and  SOB with palpitations as well as chronic lower ext edema. Not sleeping well.    Review of Systems  Constitutional: Positive for fatigue. Negative for fever, activity change and appetite change.  Respiratory: Positive for shortness of breath. Negative for chest tightness and wheezing.   Cardiovascular: Positive for palpitations and leg swelling. Negative for chest pain.  Musculoskeletal: Negative for gait problem.  Skin: Negative for pallor.  Neurological: Negative for dizziness, syncope, weakness and light-headedness.  Psychiatric/Behavioral: Negative.        Objective:   Physical Exam  Nursing note and vitals reviewed. Constitutional: She is oriented to person, place, and time. She appears well-developed and well-nourished. No distress.  HENT:  Head: Normocephalic and atraumatic.  Right Ear: External ear normal.  Left Ear: External ear normal.  Mouth/Throat: Oropharynx is clear and moist.  Eyes: Conjunctivae normal and EOM are normal. No scleral icterus.  Neck: Neck supple. No thyromegaly present.  Cardiovascular: Normal rate, regular rhythm and normal heart sounds.   Pulmonary/Chest: Effort normal and breath sounds normal. No respiratory distress.  Musculoskeletal: She exhibits edema. She exhibits no tenderness.  Lymphadenopathy:    She has no cervical adenopathy.  Neurological: She is alert and oriented to person, place, and time. She has normal reflexes. No cranial nerve deficit. She exhibits normal muscle tone. Coordination normal.  Skin: Skin is warm and dry. There is pallor.    Psychiatric: She has a normal mood and affect. Her behavior is normal. Thought content normal.          Assessment & Plan:   1. Hypothyroidism  TSH, T4, Free Continue current supplement  2. Unspecified vitamin D deficiency  Vitamin D, 25-hydroxy  3. Dyspnea on exertion    4. Other malaise and fatigue  Magnesium (had low Magnesium in the past)

## 2012-03-07 NOTE — Progress Notes (Signed)
Quick Note:  Please call pt and advise that the following labs are abnormal... Labs are normal except Vit D level is just within the normal range. Consider getting a good multivitamin for seniors that has additional Vit D3.  Try to eat more Vitamin D- rich foods like salmon and tuna (if you are not allergic to seafood), mushrooms, eggs and fortified dairy products.  Some sun exposure most days of the week is helpful also.   Copy to pt. ______

## 2012-03-09 ENCOUNTER — Telehealth: Payer: Self-pay

## 2012-03-09 NOTE — Telephone Encounter (Signed)
?'  s answered.

## 2012-03-09 NOTE — Telephone Encounter (Signed)
--  Pt is returning call from our office regarding her lab results.   [phone]

## 2012-03-09 NOTE — Progress Notes (Signed)
[facility]  NAME:  Teresa [name], Teresa [name]                     ACCOUNT NO.:  MEDICAL RECORD NO.:  [id number]      DATE OF BIRTH:  1941-06-12  PHYSICIAN:  Theodoro Kos, DO       VISIT DATE:  03/08/2012                                  OFFICE VISIT   The patient is a 71 year old female who is here for followup on her abdominal ulcer.  She has been using Adaptic over the past week.  She had had ACell placed at my office, and it is doing better.  Today I see 2 sutures that I went ahead and clipped.  I am very concerned that the hernia is going to reopen, and I will call-in let Dr. __________ will continue with Endoform and we will have her follow up in 1 week.     Theodoro Kos, DO     CS/MEDQ  D:  03/08/2012  T:  03/09/2012  Job:  [id number]

## 2012-03-10 ENCOUNTER — Telehealth: Payer: Self-pay

## 2012-03-10 DIAGNOSIS — G894 Chronic pain syndrome: Secondary | ICD-10-CM

## 2012-03-10 NOTE — Telephone Encounter (Signed)
I spoke to patient ,told her it looks like it is too soon. She got # 90 on 02/19/12. She states she takes 3 /day and states she got a 23 day supply. She would like for you to renew this so she can work. She has taken the hydrocodone for many years and needs it to be sent in for her to [facility]

## 2012-03-10 NOTE — Telephone Encounter (Signed)
I also called [employer] to find out who authorized RF on 02/19/12; it was [employer], PA-C. I will not authorize a refill; it is too early and if pt takes 3/day, she should still have 30 tabs left. Pharmacy verified that she received 90 tabs.

## 2012-03-10 NOTE — Telephone Encounter (Signed)
PT STATES HER PHARMACY STATES WE DENIED HER HYDROCODONE  AND SHE DOESN'T UNDERSTAND WHY SINCE IT HAVE NEVER DENIED IT [place] [phone]

## 2012-03-11 NOTE — Telephone Encounter (Signed)
Left message for patient to have her call me back about this, so I can advise.

## 2012-03-11 NOTE — Telephone Encounter (Signed)
I have spoken to patient about this, and she wants to know if you can renew this for her once, and refer her to a pain clinic. She states she has arthritis pain and she also has pain from 2 failed hernia surgeries/ please advise and I will call her back.

## 2012-03-11 NOTE — Telephone Encounter (Signed)
PT IS AT HOME NOW.  SHE WAS A WORK WHEN WE CALLED EARLIER.  PLEASE CALL [phone]

## 2012-03-12 ENCOUNTER — Telehealth: Payer: Self-pay

## 2012-03-12 MED ORDER — HYDROCODONE-ACETAMINOPHEN 5-500 MG PO TABS: 1.0000 | ORAL_TABLET | Freq: Four times a day (QID) | ORAL | Status: DC | PRN

## 2012-03-12 NOTE — Telephone Encounter (Signed)
Pharmacist called for pt and asked if we can go ahead and fill Rx for Vicodin bc Rx is written for Q 6 hrs. Explained that Dr Leward Quan has already reviewed this and stated she will not fill early and this was explained to pt this morning - see prev phone message notes. Pharmacist agreed to not fill early.

## 2012-03-12 NOTE — Telephone Encounter (Signed)
I have called patient to advise of this. She states the Rx she got was 23 day supply, I advised it was not. The 90 pills she got should last for 1 month. She should be able to fill this on Oct 5th, on schedule.  She is advised also we will call with the appt for the pain management clinic.

## 2012-03-12 NOTE — Telephone Encounter (Signed)
I will renew it ONCE and refer her for pain management; she will not be able to pick up refill until Mar 20, 2012.

## 2012-03-16 ENCOUNTER — Ambulatory Visit (INDEPENDENT_AMBULATORY_CARE_PROVIDER_SITE_OTHER): Payer: Medicare Other | Admitting: General Surgery

## 2012-03-16 ENCOUNTER — Encounter (INDEPENDENT_AMBULATORY_CARE_PROVIDER_SITE_OTHER): Payer: Self-pay | Admitting: General Surgery

## 2012-03-16 VITALS — BP 132/66 | HR 68 | Temp 97.0°F | Resp 16 | Ht 63.0 in | Wt 158.8 lb

## 2012-03-16 DIAGNOSIS — K439 Ventral hernia without obstruction or gangrene: Secondary | ICD-10-CM

## 2012-03-16 NOTE — Progress Notes (Signed)
Subjective:     Patient ID: Teresa [name], female   DOB: 02-14-1941, 71 y.o.   MRN: [id number]  HPI This patient is known to me for an exploratory laparotomy with lysis of adhesions and bowel resection for perforated bowel back in October of 2012.  Her hospital course was complicated with necrotic fascia and wound dehiscence.  She has been working with plastic surgery to heal her wound but she has as expected a recurrent ventral hernia. She comes back today for with a 3 cm x 3 cm opening at the lower portion of her incision with some granulation tissue and she was referred for concern of possible fistula. The patient says that she has had some dark drainage this is not unusual for her wound and she does not feel that this smells foul or is concerning for enteral contents. She's not had any abdominal pain or fevers or chills and overall feels in her usual state of health since this all began. Further complicating the issue of is a history for 3 or 4 prior incisional hernia repairs at least 2 of these with the placement of Marlex mesh many years ago  Review of Systems     Objective:   Physical Exam She is in no acute distress and nontoxic-appearing Her incision is healed in the upper portion of the she has a 2-3 cm area of granulation tissue at the lower aspect of her abdomen. I probed this area with a Q-tip and it does not seem to enter into the abdomen and does not appear to have any enteral contents concerning for a fistula.  She does have a wide fascial defect in the upper abdomen consistent with a known ventral hernia which is nontender    Assessment:     Recurrent ventral hernia with chronic wound This is a difficult problem partly because of her open  And chronic wound, and partly due to her history of multiple prior ventral hernia repairs with mesh. I had a long discussion with her regarding the options and given her open wound and chronic infections, I'm not sure that ventral hernia repair  with a synthetic mesh would be a great option for her but she would be very high risk for recurrence of it we used a biologic mesh or a primary repair. I recommended that she see Dr. Alvan Dame at [facility] for evaluation of her ventral hernia given thecomplex nature.  She is not really interested in traveling to [facility] despite my recommendations. I do not think that this is an enterocutaneous fistula at this time although this could be a very low output fistula or chronic infection. Even if it is a fistula, I do not see the need for any emergent intervention currently. Again, I recommended evaluation and surgery at [facility] and we will be happy to set her up with the proper referral    Plan:

## 2012-03-16 NOTE — Progress Notes (Signed)
[facility]  NAME:  Teresa [name], Teresa [name]                ACCOUNT NO.:  [id number]  MEDICAL RECORD NO.:  [id number]      DATE OF BIRTH:  06-02-1941  PHYSICIAN:  Theodoro Kos, DO       VISIT DATE:  03/15/2012                                  OFFICE VISIT   The patient is a 71 year old female who is here for followup on her abdominal ulcer.  She has got a lot irritation around the periwound area and the Q-tip probes 2 to 3 cm in the inferior left direction.  I am very concerned that this is connecting into the intra-abdominal area. We will continue with Silvercel on the area to try and help the superficial opening.  I also called Dr. Lilyan Punt and expressed my concern and he will plan to see her tomorrow.     Theodoro Kos, DO     CS/MEDQ  D:  03/15/2012  T:  03/16/2012  Job:  [id number]

## 2012-03-17 ENCOUNTER — Encounter (INDEPENDENT_AMBULATORY_CARE_PROVIDER_SITE_OTHER): Payer: PRIVATE HEALTH INSURANCE | Admitting: General Surgery

## 2012-03-19 ENCOUNTER — Telehealth: Payer: Self-pay | Admitting: Physician Assistant

## 2012-03-19 MED ORDER — ALPRAZOLAM 0.25 MG PO TABS: 0.2500 mg | ORAL_TABLET | Freq: Three times a day (TID) | ORAL | Status: DC | PRN

## 2012-03-19 NOTE — Telephone Encounter (Signed)
Rx printed

## 2012-03-19 NOTE — Telephone Encounter (Signed)
Rx at front desk ready for pick up pt notified.

## 2012-03-22 ENCOUNTER — Other Ambulatory Visit: Payer: Self-pay

## 2012-03-22 ENCOUNTER — Encounter (HOSPITAL_BASED_OUTPATIENT_CLINIC_OR_DEPARTMENT_OTHER): Payer: Medicare Other | Attending: Plastic Surgery

## 2012-03-22 DIAGNOSIS — L98499 Non-pressure chronic ulcer of skin of other sites with unspecified severity: Secondary | ICD-10-CM | POA: Insufficient documentation

## 2012-03-22 DIAGNOSIS — Y838 Other surgical procedures as the cause of abnormal reaction of the patient, or of later complication, without mention of misadventure at the time of the procedure: Secondary | ICD-10-CM | POA: Insufficient documentation

## 2012-03-22 DIAGNOSIS — T8189XA Other complications of procedures, not elsewhere classified, initial encounter: Secondary | ICD-10-CM | POA: Insufficient documentation

## 2012-03-22 NOTE — Telephone Encounter (Signed)
[facility] called for refill on:    Alprazolam 0.25 mg tablet One (1) tablet, three (3) times daily for sleep. Rx'd by Christell Faith, [facility] Return phone for pharmacy: [phone].

## 2012-03-22 NOTE — Telephone Encounter (Signed)
Called in renewal, it was approved by [place].

## 2012-03-29 ENCOUNTER — Encounter (HOSPITAL_BASED_OUTPATIENT_CLINIC_OR_DEPARTMENT_OTHER): Payer: Medicare Other

## 2012-04-02 ENCOUNTER — Encounter (INDEPENDENT_AMBULATORY_CARE_PROVIDER_SITE_OTHER): Payer: Medicare Other | Admitting: General Surgery

## 2012-04-05 NOTE — Progress Notes (Signed)
[facility]  NAME:  Teresa [name], STALLCUP                ACCOUNT NO.:  [id number]  MEDICAL RECORD NO.:  [id number]      DATE OF BIRTH:  04-Jul-1940  PHYSICIAN:  Theodoro Kos, DO            VISIT DATE:                                  OFFICE VISIT   Ms. Frangella is here for followup after undergoing excision of abdominal ulcer with a repeat.  She did very well initially but then had a 1/2 to 1 cm area opening in the lower portion of the abdominal incision.  This is concerning for recurrent hernia and she is seeing Dr. Lilyan Punt for this.  We are going to continue with the Pace. There is no sign of infection, but it is still concerning for the hernia.  I do recommend that she follow up with her consultation at [facility] for a second opinion, and we will see her back in 3 weeks.     Theodoro Kos, DO     CS/MEDQ  D:  04/05/2012  T:  04/05/2012  Job:  [id number]

## 2012-04-07 ENCOUNTER — Telehealth (INDEPENDENT_AMBULATORY_CARE_PROVIDER_SITE_OTHER): Payer: Self-pay | Admitting: General Surgery

## 2012-04-07 NOTE — Telephone Encounter (Signed)
Patient calling to check on referral to [facility] for hernia repair. Please call patient and let her know appt date/time or where this is in the referral process. [phone] or [phone].

## 2012-04-09 ENCOUNTER — Telehealth (INDEPENDENT_AMBULATORY_CARE_PROVIDER_SITE_OTHER): Payer: Self-pay

## 2012-04-09 ENCOUNTER — Other Ambulatory Visit (INDEPENDENT_AMBULATORY_CARE_PROVIDER_SITE_OTHER): Payer: Self-pay

## 2012-04-09 DIAGNOSIS — K439 Ventral hernia without obstruction or gangrene: Secondary | ICD-10-CM

## 2012-04-09 NOTE — Telephone Encounter (Signed)
Pt has appt with Dr. Migdalia Dk in three weeks.  She would like some guidance on wound care until that appt.  She wants to know if appt at [facility] can be moved up.  Please call asap.

## 2012-04-09 NOTE — Telephone Encounter (Signed)
Patient notified of Referral appointment to Dr. Alvan Dame @ [facility]

## 2012-04-09 NOTE — Telephone Encounter (Signed)
Referral to General Surgery- Dr. Jenna Luo @ [facility] [phone] #1.  Appointment scheduled May 06, 2012 @ 2:30 pm w/Dr. Alvan Dame.

## 2012-04-11 ENCOUNTER — Ambulatory Visit (INDEPENDENT_AMBULATORY_CARE_PROVIDER_SITE_OTHER): Payer: Medicare Other | Admitting: Emergency Medicine

## 2012-04-11 VITALS — BP 127/76 | HR 92 | Temp 98.2°F | Resp 18 | Ht 63.0 in | Wt 163.6 lb

## 2012-04-11 DIAGNOSIS — K432 Incisional hernia without obstruction or gangrene: Secondary | ICD-10-CM

## 2012-04-11 DIAGNOSIS — M1990 Unspecified osteoarthritis, unspecified site: Secondary | ICD-10-CM

## 2012-04-11 MED ORDER — HYDROCODONE-ACETAMINOPHEN 5-500 MG PO TABS: 1.0000 | ORAL_TABLET | Freq: Four times a day (QID) | ORAL | Status: DC | PRN

## 2012-04-11 NOTE — Progress Notes (Signed)
[facility] [address], [place] [address] [phone]  Date:  04/11/2012   Name:  Teresa [name]   DOB:  1941/02/01   MRN:  [id number]  PCP:  Teresa Lennox, MD    Chief Complaint: Medication Refill   History of Present Illness:  Teresa [name] is a 71 y.o. very pleasant female patient who presents with the following:  States that she is in chronic pain and has been prescribed vicodin qid for 4-5 years.  Has a few days medication remaining and wanted to come in and "face to face" get a refill as her 90 pills only last her 23 days.  She has been offered pain management consultation and apparently she has a ventral hernia repair upcoming and it has been decided that she would be better served to have the consultation following recovery from her surgery.  She is concerned that she will run out of her medication prior to a possible appt with Teresa [name].  Patient Active Problem List  Diagnosis  . Nonischemic cardiomyopathy  . HTN (hypertension)  . Tobacco abuse  . Chest pain  . SBO (small bowel obstruction), s/p expl lap  . Ileus following gastrointestinal surgery  . Hypothyroid  . Dyslipidemia  . GERD (gastroesophageal reflux disease)  . Osteoarthritis  . Cardiomyopathy  . Mitral regurgitation  . Tricuspid regurgitation  . Chronic renal insufficiency  . Abdominal wall skin ulcer    Past Medical History  Diagnosis Date  . Nonischemic cardiomyopathy CARDIOLOGIST- Teresa [name] - LAST VISIT  AUG 2012  (IN EPIC)    EF is 25% per echo February 2012, non ischemic myoview 12/2009.  EF 50% echo 7/12 and plans for ICD cancelled.   Marland Kitchen HTN (hypertension)   . PVC (premature ventricular contraction)   . Heart palpitations   . Valvular heart disease     Moderate to severe MR, moderate TR, LAE per echo 7/11  . Spinal stenosis   . Abdominal pain   . History of glaucoma   . Non-healing surgical wound ABDOMINAL S/P EXPLORATOY LAPAROTOMY 04-18-2011  . History  of small bowel obstruction 2009 AND NOV 2012  . Peptic ulcer disease   . GERD (gastroesophageal reflux disease)   . H/O CHF   . Moderate mitral regurgitation   . Moderate tricuspid regurgitation   . Arthritis FEET, KNEES, HANDS  . H/O hiatal hernia   . Urge urinary incontinence   . Left ovarian cyst   . Diabetes mellitus type 2, diet-controlled   . Shortness of breath   . Wears dentures     upper denture-lower partial    Past Surgical History  Procedure Date  . Tubal ligation   . Glaucoma surgery 1978  . Exploratomy lap. / extensive lysis adhesions/ small bowel resection x2 with primary anastomosis x2 04-18-2011    SMALL BOWEL OBSTRUCTION  . Knee arthroscopy w/ meniscectomy 06-05-2004  . Transthoracic echocardiogram 12-27-2010    EF 45-50%/ MODERATE MV REGURG. / LEFT ATRIUM MILDLY DILATED/ MILD TRICUSPID REGURG.  . Cardiovascular stress test JULY 2011-  Teresa [name]    NO ISCHEMIA  . Thyroidectomy 1978 GOITOR    AND CERVICAL COLD KNIFE CONE BX  . Tonsillectomy AGE 53  . Benign right breast tumor removed   . Appendectomy 1979    RUPTURED  . Abdominal hernia repair 15 YRS AGO    PERITINITIS  . Ventral hernia repair X4  LAST ONE 20 YRS AGO    ABDOMINAL --  EACH ONE IN DIFFERENT AREA  . Wound debridement 09/25/2011    Procedure: DEBRIDEMENT CLOSURE/ABDOMINAL WOUND;  Surgeon: Teresa Kos, DO;  Location: [place];  Service: Plastics;  Laterality: N/A;  excision of abdominal wound with primary closure  . Ventral hernia repair 01/29/2012    Procedure: HERNIA REPAIR VENTRAL ADULT;  Surgeon: Teresa Kos, DO;  Location: [place];  Service: Plastics;  Laterality: N/A;  . Hernia repair     History  Substance Use Topics  . Smoking status: Former Smoker -- 30 years    Types: Cigarettes    Quit date: 09/22/2010  . Smokeless tobacco: Never Used   Comment: quit 2011  . Alcohol Use: No    Family History  Problem Relation Age of Onset  . Stroke  Brother   . Stroke Sister   . Heart disease Sister     Allergies  Allergen Reactions  . Aspirin Other (See Comments)    HX Bleeding Ulcer     Medication list has been reviewed and updated.  Current Outpatient Prescriptions on File Prior to Visit  Medication Sig Dispense Refill  . ALDACTONE 25 MG tablet TAKE (1/2) TABLET DAILY.  45 tablet  2  . ALPRAZolam (XANAX) 0.25 MG tablet Take 1 tablet (0.25 mg total) by mouth 3 (three) times daily as needed for anxiety.  90 tablet  0  . COREG 12.5 MG tablet TAKE 1 TABLET TWICE DAILY WITH A MEAL.  60 each  9  . COZAAR 25 MG tablet TAKE 1 TABLET DAILY.  30 tablet  3  . CVS ZINC PO Take 1 tablet by mouth daily.      Marland Kitchen HYDROcodone-acetaminophen (VICODIN) 5-500 MG per tablet Take 1 tablet by mouth every 6 (six) hours as needed for pain.  90 tablet  0  . LASIX 40 MG tablet TAKE 1 OR 2 TABLETS DAILY AS NEEDED.  60 each  3  . LEVOXYL 88 MCG tablet TAKE 1 TABLET ONCE DAILY.  30 each  6  . Multiple Vitamin (MULTIVITAMIN) tablet Take 1 tablet by mouth daily.      . naphazoline (CLEAR EYES) 0.012 % ophthalmic solution Place 1 drop into both eyes as needed. For blood shot eyes      . NEXIUM 40 MG capsule TAKE (1) CAPSULE DAILY.  30 each  3  . potassium chloride SA (K-DUR,KLOR-CON) 20 MEQ tablet Take 20 mEq by mouth daily.      . simethicone (MYLICON) 80 MG chewable tablet Chew 80 mg by mouth every 6 (six) hours as needed. Gas pain        Review of Systems:  As per HPI, otherwise negative.    Physical Examination: Filed Vitals:   04/11/12 1027  BP: 127/76  Pulse: 92  Temp: 98.2 F (36.8 C)  Resp: 18   Filed Vitals:   04/11/12 1027  Height: 5\' 3"  (1.6 m)  Weight: 163 lb 9.6 oz (74.208 kg)   Body mass index is 28.98 kg/(m^2). Ideal Body Weight: Weight in (lb) to have BMI = 25: 140.8    GEN: WDWN, NAD, Non-toxic, Alert & Oriented x 3 HEENT: Atraumatic, Normocephalic.  Ears and Nose: No external deformity. EXTR: No  clubbing/cyanosis/edema NEURO: Normal gait.  PSYCH: Normally interactive. Conversant. Not depressed or anxious appearing.  Calm demeanor.  ABDOMEN:  Ventral hernia with small area of inferior incision that is open.  Assessment and Plan: Osteoarthritis Ventral incisional hernia Chronic pain I asked her to follow up with  Teresa [name] in the future.  I think it important that she be followed by the same provider for chronic pain medication.  I gave her a 7 day supply and asked that she follow up up with Teresa [name].  Roselee Culver, MD

## 2012-04-19 ENCOUNTER — Telehealth: Payer: Self-pay

## 2012-04-19 MED ORDER — ALPRAZOLAM 0.25 MG PO TABS: 0.2500 mg | ORAL_TABLET | Freq: Three times a day (TID) | ORAL | Status: DC | PRN

## 2012-04-19 NOTE — Progress Notes (Signed)
Reviewed and agree.

## 2012-04-19 NOTE — Telephone Encounter (Signed)
Pended this Rx, please advise.

## 2012-04-19 NOTE — Telephone Encounter (Signed)
Holly from [facility] called to request refill for patient for Alprazolam .25mg  #90 1 tablet 3 times daily as needed. Please call [place] at [phone].

## 2012-04-19 NOTE — Telephone Encounter (Signed)
Called in for her to [facility]

## 2012-04-19 NOTE — Telephone Encounter (Signed)
Rx done and ready to be faxed

## 2012-04-24 ENCOUNTER — Telehealth: Payer: Self-pay

## 2012-04-24 NOTE — Telephone Encounter (Signed)
PATIENT IS REQUESTING HYDROcodone-acetaminophen (VICODIN). PHAMRAMACY WAS WAITING TO HEAR BACK FROM [facility]. PATIENT STATES SHE NEEDS ENOUGH TO GET THROUGH NEXT Friday. FOR PAIN MANAGEMENT CLINIC.

## 2012-04-25 MED ORDER — HYDROCODONE-ACETAMINOPHEN 5-500 MG PO TABS: 1.0000 | ORAL_TABLET | Freq: Four times a day (QID) | ORAL | Status: DC | PRN

## 2012-04-25 NOTE — Telephone Encounter (Signed)
Prescription for #30 signed at desk to get her through until pain management appt

## 2012-04-25 NOTE — Telephone Encounter (Signed)
Vicodin was given on 04/11/12  #30. Please advise

## 2012-04-26 ENCOUNTER — Encounter (HOSPITAL_BASED_OUTPATIENT_CLINIC_OR_DEPARTMENT_OTHER): Payer: Medicare Other | Attending: Plastic Surgery

## 2012-04-26 ENCOUNTER — Encounter (HOSPITAL_BASED_OUTPATIENT_CLINIC_OR_DEPARTMENT_OTHER): Payer: Medicare Other

## 2012-04-26 DIAGNOSIS — T8189XA Other complications of procedures, not elsewhere classified, initial encounter: Secondary | ICD-10-CM | POA: Insufficient documentation

## 2012-04-26 DIAGNOSIS — Y838 Other surgical procedures as the cause of abnormal reaction of the patient, or of later complication, without mention of misadventure at the time of the procedure: Secondary | ICD-10-CM | POA: Insufficient documentation

## 2012-04-26 DIAGNOSIS — L98499 Non-pressure chronic ulcer of skin of other sites with unspecified severity: Secondary | ICD-10-CM | POA: Insufficient documentation

## 2012-04-26 NOTE — Telephone Encounter (Signed)
RX faxed, called pt to let her know.

## 2012-04-28 ENCOUNTER — Telehealth (INDEPENDENT_AMBULATORY_CARE_PROVIDER_SITE_OTHER): Payer: Self-pay

## 2012-04-28 NOTE — Telephone Encounter (Signed)
Medical records faxed to Dr. Alvan Dame @ [facility] [phone] for appointment on May 06, 2012 @ 2:30 pm.

## 2012-05-06 ENCOUNTER — Other Ambulatory Visit: Payer: Self-pay | Admitting: Pain Medicine

## 2012-05-06 DIAGNOSIS — M25561 Pain in right knee: Secondary | ICD-10-CM

## 2012-05-06 DIAGNOSIS — I10 Essential (primary) hypertension: Secondary | ICD-10-CM | POA: Insufficient documentation

## 2012-05-06 DIAGNOSIS — F419 Anxiety disorder, unspecified: Secondary | ICD-10-CM | POA: Insufficient documentation

## 2012-05-10 DIAGNOSIS — K432 Incisional hernia without obstruction or gangrene: Secondary | ICD-10-CM | POA: Insufficient documentation

## 2012-05-17 ENCOUNTER — Encounter (HOSPITAL_BASED_OUTPATIENT_CLINIC_OR_DEPARTMENT_OTHER): Payer: Medicare Other | Attending: Plastic Surgery

## 2012-05-17 DIAGNOSIS — R109 Unspecified abdominal pain: Secondary | ICD-10-CM | POA: Insufficient documentation

## 2012-05-17 DIAGNOSIS — L98499 Non-pressure chronic ulcer of skin of other sites with unspecified severity: Secondary | ICD-10-CM | POA: Insufficient documentation

## 2012-05-17 NOTE — Progress Notes (Signed)
[facility]  NAME:  Teresa [name], Teresa [name] NO.:  [id number]  MEDICAL RECORD NO.:  [id number]      DATE OF BIRTH:  [birth year]  PHYSICIAN:  Theodoro Kos, DO            VISIT DATE:                                  OFFICE VISIT   The patient is a 70 year old female who is here for followup on her abdominal ulcer.  She was seen by a general surgeon at [facility] and no intervention was recommended.  She complains about some abdominal cramping.  I have recommended that she see General Surgery back here for evaluation of her bowel.  From a wound standpoint, she actually looks better.  I put a little bit of silver nitrate on the hypergranulated area.  The measurements are noted in the notes.  I do not see any overt infection.  Recommend Endoform and we will see her back after she sees General Surgery.     Theodoro Kos, DO     CS/MEDQ  D:  05/17/2012  T:  05/17/2012  Job:  [id number]

## 2012-05-20 ENCOUNTER — Telehealth: Payer: Self-pay

## 2012-05-20 NOTE — Telephone Encounter (Signed)
Pt previously called Teresa [name] re: Alprazolam refill.  They told her they were unable to fax electronically, but had faxed paper copies twice.  Pt would like to get refill.  [id number]

## 2012-05-20 NOTE — Telephone Encounter (Signed)
Please advise on Renewal of the Alprazolam.

## 2012-05-21 ENCOUNTER — Ambulatory Visit
Admission: RE | Admit: 2012-05-21 | Discharge: 2012-05-21 | Disposition: A | Discharge status: Home / Self Care | Payer: Medicare Other | Source: Ambulatory Visit | Attending: Pain Medicine | Admitting: Pain Medicine

## 2012-05-21 DIAGNOSIS — M25561 Pain in right knee: Secondary | ICD-10-CM

## 2012-05-21 MED ORDER — ALPRAZOLAM 0.25 MG PO TABS: 0.2500 mg | ORAL_TABLET | Freq: Three times a day (TID) | ORAL | Status: DC | PRN

## 2012-05-21 NOTE — Telephone Encounter (Signed)
I authorized Alprazolam refill via phone.

## 2012-05-21 NOTE — Telephone Encounter (Signed)
Notified pt that RF was called in. She thanked [place].

## 2012-05-24 ENCOUNTER — Encounter (HOSPITAL_BASED_OUTPATIENT_CLINIC_OR_DEPARTMENT_OTHER): Payer: Medicare Other

## 2012-06-01 ENCOUNTER — Ambulatory Visit (INDEPENDENT_AMBULATORY_CARE_PROVIDER_SITE_OTHER): Payer: Medicare Other | Admitting: General Surgery

## 2012-06-01 ENCOUNTER — Encounter (INDEPENDENT_AMBULATORY_CARE_PROVIDER_SITE_OTHER): Payer: Self-pay | Admitting: General Surgery

## 2012-06-01 VITALS — BP 130/78 | HR 66 | Temp 97.6°F | Resp 18 | Wt 163.5 lb

## 2012-06-01 DIAGNOSIS — L98499 Non-pressure chronic ulcer of skin of other sites with unspecified severity: Secondary | ICD-10-CM

## 2012-06-01 DIAGNOSIS — K432 Incisional hernia without obstruction or gangrene: Secondary | ICD-10-CM

## 2012-06-06 ENCOUNTER — Encounter (INDEPENDENT_AMBULATORY_CARE_PROVIDER_SITE_OTHER): Payer: Self-pay | Admitting: General Surgery

## 2012-06-06 NOTE — Progress Notes (Signed)
Subjective:     Patient ID: Teresa [name], female   DOB: [birth year], 71 y.o.   MRN: [id number]  HPI 16 yof who was previously healthy and active but then required a laprotomy with sbr.  She had open wound postop. This was complicated by fascial dehiscence and necrosis.  Eventually she left hospital but has had some sort of wound since then. She has been managed by Dr. Migdalia Dk of plastic surgery for these wounds.  She has had multiple procedures to try and get her wounds to heal.  She also has a resulting large ventral hernia with her fascial edges pretty close to being near her asis bilaterally and her abdomen has began to bulge more as time has passed.  She has no difficulty in having bms, eating, or with any n/v.  She is limited in her activity by both the wounds that she does dressing changes on and the fact that her abominal wall does not exist any more and this is making activities more difficult.  She has been seen by Dr. Lilyan Punt in my practice and her referred her to see someone in [facility] about this as well who recommended a binder to her.  She comes in today for another opinion.  Review of Systems     Objective:   Physical Exam Large ventral hernia with retracted fascia, abdomen is nontender she has two holes in lower portion of her incisions both of which lead to larger cavities laterally and are not adequately drained at this time, no infection    Assessment:     Incisional hernia Open abdominal wall wound    Plan:     I think the first problem is trying to get the wounds to heal.  I think opening these laterally in the cavities that are there is the best plan.  I would open these away from midline and try to get them to heal by secondary intention.  I would attempt this prior to any more involved attempt.  I think if these healed and she did not have to do dressing changes she would be much happier.   The hernia is another issues.  After a long conversation about how this bothers  her and what her goals would be I don't know that she would be better off having this repaired.  We discussed component separation, combination with plastics but this all still would require some bridging with mesh and risk of infection as well as having the bridge of mesh which could still be significant.  I am going to discuss with Dr.Sanger and I would be happy to do this along with her to see if we can get wounds healed first.  I don't think she needs hernia fixed to get wounds to heal.

## 2012-06-07 NOTE — Progress Notes (Signed)
[facility]  NAME:  ARTHI, TONN NO.:  [id number]  MEDICAL RECORD NO.:  [id number]      DATE OF BIRTH:  20-Dec-1940  PHYSICIAN:  Theodoro Kos, DO            VISIT DATE:                                  OFFICE VISIT   The patient is a 71 year old female, who is here for followup on her abdominal ulcer.  She opened up again and has been draining.  It does not appear to be infected and it actually looks better than it did the last time I saw her.  I talked with Dr. Donne Hazel and we are planning on getting her to the operating room for excision of the ulcer and closure hopefully primarily.  Otherwise her medications have not changed.  Her social history is unchanged and the size of the ulcers are noted in the notes and they do track no overt infection or surrounding erythema.  We will plan for the operating room, and we will see her back after that.     Theodoro Kos, DO     CS/MEDQ  D:  06/07/2012  T:  06/07/2012  Job:  [id number]

## 2012-06-17 ENCOUNTER — Telehealth: Payer: Self-pay | Admitting: Radiology

## 2012-06-17 MED ORDER — ALPRAZOLAM 0.25 MG PO TABS: 0.2500 mg | ORAL_TABLET | Freq: Three times a day (TID) | ORAL | Status: DC | PRN

## 2012-06-17 NOTE — Telephone Encounter (Signed)
Alprazolam 0.25 mg  #90 (0 RF) - Called to [facility].

## 2012-06-17 NOTE — Telephone Encounter (Signed)
Pharmacy called and is asking for renewal of Xanax 0.25mg  please advise [facility].

## 2012-06-21 ENCOUNTER — Encounter (HOSPITAL_BASED_OUTPATIENT_CLINIC_OR_DEPARTMENT_OTHER): Payer: Medicare Other | Attending: Plastic Surgery

## 2012-06-21 DIAGNOSIS — Y838 Other surgical procedures as the cause of abnormal reaction of the patient, or of later complication, without mention of misadventure at the time of the procedure: Secondary | ICD-10-CM | POA: Insufficient documentation

## 2012-06-21 DIAGNOSIS — L98499 Non-pressure chronic ulcer of skin of other sites with unspecified severity: Secondary | ICD-10-CM | POA: Insufficient documentation

## 2012-06-21 DIAGNOSIS — S31109A Unspecified open wound of abdominal wall, unspecified quadrant without penetration into peritoneal cavity, initial encounter: Secondary | ICD-10-CM | POA: Insufficient documentation

## 2012-06-21 NOTE — Progress Notes (Signed)
[facility]  NAME:  Teresa [name], Teresa [name]                ACCOUNT NO.:  [id number]  MEDICAL RECORD NO.:  [id number]      DATE OF BIRTH:  21-May-1941  PHYSICIAN:  Theodoro Kos, DO       VISIT DATE:  06/21/2012                                  OFFICE VISIT   The patient is a 72 year old female here for followup on her abdominal ulcer.  She underwent surgery in the past and healed and now unfortunately she has opened up again.  She is going to be taken to the OR with Dr. Donne Hazel and myself for repair.  Risks and complications have been discussed, but we will wait on the arrangements to get her scheduled together.  In the meantime, she has been using Endoform, the areas are stable, not getting any worse.  The surrounding area is clean. It does not appear to be infected.  We will switch her to silver alginate to help bring in some silver decreased bacterial count for surgery.  In the meantime, she needs to continue with multivitamin, vitamin C, zinc, and follow up in 3 weeks.     Theodoro Kos, DO     CS/MEDQ  D:  06/21/2012  T:  06/21/2012  Job:  [id number]

## 2012-06-25 ENCOUNTER — Telehealth (INDEPENDENT_AMBULATORY_CARE_PROVIDER_SITE_OTHER): Payer: Self-pay

## 2012-06-25 NOTE — Telephone Encounter (Signed)
LMOM giving pt the next appt with Dr Donne Hazel for 1/31 arrive at 11:15.

## 2012-06-26 ENCOUNTER — Other Ambulatory Visit: Payer: Self-pay | Admitting: Physician Assistant

## 2012-06-26 ENCOUNTER — Other Ambulatory Visit: Payer: Self-pay | Admitting: Cardiology

## 2012-07-13 NOTE — Progress Notes (Signed)
[facility]  NAME:  Teresa [name], Teresa [name]                     ACCOUNT NO.:  MEDICAL RECORD NO.:  [id number]      DATE OF BIRTH:  [birth year]  PHYSICIAN:  Theodoro Kos, DO       VISIT DATE:  07/12/2012                                  OFFICE VISIT   The patient is a 72 year old female who is here for followup on her abdominal ulcers.  We are using Silvercel, which seems to be holding things steady for the moment.  She does have an appointment with Dr. Donne Hazel for this week.  We will wait to see what he says about going back to the operating room.  In the meantime, we will continue with the Silvercel.  There does not appear to be any sign of infection at present.  No medications have changed and her social history is the same.     Theodoro Kos, DO     CS/MEDQ  D:  07/12/2012  T:  07/12/2012  Job:  [id number]

## 2012-07-16 ENCOUNTER — Encounter (INDEPENDENT_AMBULATORY_CARE_PROVIDER_SITE_OTHER): Payer: Self-pay | Admitting: General Surgery

## 2012-07-16 ENCOUNTER — Ambulatory Visit (INDEPENDENT_AMBULATORY_CARE_PROVIDER_SITE_OTHER): Payer: Medicare Other | Admitting: General Surgery

## 2012-07-16 VITALS — BP 128/80 | HR 76 | Temp 97.8°F | Resp 12 | Ht 63.5 in | Wt 166.8 lb

## 2012-07-16 DIAGNOSIS — L98499 Non-pressure chronic ulcer of skin of other sites with unspecified severity: Secondary | ICD-10-CM

## 2012-07-16 DIAGNOSIS — K632 Fistula of intestine: Secondary | ICD-10-CM

## 2012-07-16 NOTE — Addendum Note (Signed)
Addended byRolm Bookbinder on: 07/16/2012 12:29 PM   Modules accepted: Orders

## 2012-07-16 NOTE — Progress Notes (Signed)
Patient ID: Teresa [name], female   DOB: Aug 19, 1940, 72 y.o.   MRN: [id number]  Chief Complaint  Patient presents with  . Follow-up    HPI Teresa [name] is a 72 y.o. female.   HPI 33 yof who was previously healthy and active but then required a laprotomy with sbr. She had open wound postop. This was complicated by fascial dehiscence and necrosis. Eventually she left hospital but has had some sort of wound since then. She has been managed by Dr. Migdalia Dk of plastic surgery for these wounds. She has had multiple procedures to try and get her wounds to heal. She also has a resulting large ventral hernia with her fascial edges pretty close to being near her asis bilaterally and her abdomen has began to bulge more as time has passed. She has no difficulty in having bms, eating, or with any n/v. She is limited in her activity by both the wounds that she does dressing changes on and the fact that her abominal wall does not exist any more and this is making activities more difficult. She has been seen by Dr. Lilyan Punt in my practice and he referred her to see someone in [facility] about this as well who recommended a binder to her.  I think the first problem is trying to get the wounds to heal. I think opening these laterally in the cavities that are there is the best plan. I would open these away from midline and try to get them to heal by secondary intention. I would attempt this prior to any more involved attempt. I think if these healed and she did not have to do dressing changes she would be much happier.  The hernia is another issues. After a long conversation about how this bothers her and what her goals would be I don't know that she would be better off having this repaired. We discussed component separation, combination with plastics but this all still would require some bridging with mesh and risk of infection as well as having the bridge of mesh which could still be significant. I am going to discuss  with Dr.Sanger and I would be happy to do this along with her to see if we can get wounds healed first. I don't think she needs hernia fixed to get wounds to heal.  Past Medical History  Diagnosis Date  . Nonischemic cardiomyopathy CARDIOLOGIST- [facility] - LAST VISIT  AUG 2012  (IN EPIC)    EF is 25% per echo February 2012, non ischemic myoview 12/2009.  EF 50% echo 7/12 and plans for ICD cancelled.   Marland Kitchen HTN (hypertension)   . PVC (premature ventricular contraction)   . Heart palpitations   . Valvular heart disease     Moderate to severe MR, moderate TR, LAE per echo 7/11  . Spinal stenosis   . Abdominal pain   . History of glaucoma   . Non-healing surgical wound ABDOMINAL S/P EXPLORATOY LAPAROTOMY 04-18-2011  . History of small bowel obstruction 2009 AND NOV 2012  . Peptic ulcer disease   . GERD (gastroesophageal reflux disease)   . H/O CHF   . Moderate mitral regurgitation   . Moderate tricuspid regurgitation   . Arthritis FEET, KNEES, HANDS  . H/O hiatal hernia   . Urge urinary incontinence   . Left ovarian cyst   . Diabetes mellitus type 2, diet-controlled   . Shortness of breath   . Wears dentures     upper denture-lower partial  Past Surgical History  Procedure Date  . Tubal ligation   . Glaucoma surgery 1978  . Exploratomy lap. / extensive lysis adhesions/ small bowel resection x2 with primary anastomosis x2 04-18-2011    SMALL BOWEL OBSTRUCTION  . Knee arthroscopy w/ meniscectomy 06-05-2004  . Transthoracic echocardiogram 12-27-2010    EF 45-50%/ MODERATE MV REGURG. / LEFT ATRIUM MILDLY DILATED/ MILD TRICUSPID REGURG.  . Cardiovascular stress test JULY 2011-  DR HOCHREIN    NO ISCHEMIA  . Thyroidectomy 1978 GOITOR    AND CERVICAL COLD KNIFE CONE BX  . Tonsillectomy AGE 89  . Benign right breast tumor removed   . Appendectomy 1979    RUPTURED  . Abdominal hernia repair 80 YRS AGO    PERITINITIS  . Ventral hernia repair X4  LAST ONE 20 YRS AGO    ABDOMINAL  --  EACH ONE IN DIFFERENT AREA  . Wound debridement 09/25/2011    Procedure: DEBRIDEMENT CLOSURE/ABDOMINAL WOUND;  Surgeon: Theodoro Kos, DO;  Location: [facility];  Service: Plastics;  Laterality: N/A;  excision of abdominal wound with primary closure  . Ventral hernia repair 01/29/2012    Procedure: HERNIA REPAIR VENTRAL ADULT;  Surgeon: Theodoro Kos, DO;  Location: [place];  Service: Plastics;  Laterality: N/A;  . Hernia repair     Family History  Problem Relation Age of Onset  . Stroke Brother   . Stroke Sister   . Heart disease Sister     Social History History  Substance Use Topics  . Smoking status: Former Smoker -- 30 years    Types: Cigarettes    Quit date: 09/22/2010  . Smokeless tobacco: Never Used     Comment: quit 2011  . Alcohol Use: No    Allergies  Allergen Reactions  . Aspirin Other (See Comments)    HX Bleeding Ulcer     Current Outpatient Prescriptions  Medication Sig Dispense Refill  . ALDACTONE 25 MG tablet TAKE (1/2) TABLET DAILY.  45 tablet  2  . ALPRAZolam (XANAX) 0.25 MG tablet Take 1 tablet (0.25 mg total) by mouth 3 (three) times daily as needed for anxiety.  90 tablet  0  . COREG 12.5 MG tablet TAKE 1 TABLET TWICE DAILY WITH A MEAL.  60 each  9  . [facility]S ZINC PO Take 1 tablet by mouth daily.      . furosemide (LASIX) 40 MG tablet TAKE 1 OR 2 TABLETS DAILY AS NEEDED.  60 tablet  2  . HYDROcodone-acetaminophen (VICODIN) 5-500 MG per tablet Take 1 tablet by mouth every 6 (six) hours as needed for pain.  30 tablet  0  . levothyroxine (SYNTHROID, LEVOTHROID) 88 MCG tablet Take 1 tablet (88 mcg total) by mouth daily. Need office visit for additional refills.  30 tablet  0  . losartan (COZAAR) 25 MG tablet TAKE 1 TABLET DAILY.  30 tablet  2  . Multiple Vitamin (MULTIVITAMIN) tablet Take 1 tablet by mouth daily.      . naphazoline (CLEAR EYES) 0.012 % ophthalmic solution Place 1 drop into both eyes as needed. For blood shot  eyes      . NEXIUM 40 MG capsule TAKE (1) CAPSULE DAILY.  30 each  3  . potassium chloride SA (K-DUR,KLOR-CON) 20 MEQ tablet Take 20 mEq by mouth daily.      . simethicone (MYLICON) 80 MG chewable tablet Chew 80 mg by mouth every 6 (six) hours as needed. Gas pain  Review of Systems Review of Systems  Blood pressure 128/80, pulse 76, temperature 97.8 F (36.6 C), temperature source Temporal, resp. rate 12, height 5' 3.5" (1.613 m), weight 166 lb 12.8 oz (75.66 kg).  Physical Exam Physical Exam  Vitals reviewed. Constitutional: She appears well-developed and well-nourished.  Neck: Neck supple.  Cardiovascular: Normal rate, regular rhythm and normal heart sounds.   Pulmonary/Chest: Effort normal and breath sounds normal. She has no wheezes. She has no rales.  Abdominal: Soft. Bowel sounds are normal. There is no tenderness. A hernia is present. Hernia confirmed positive in the ventral area.    Lymphadenopathy:    She has no cervical adenopathy.    Data Reviewed CT ABDOMEN AND PELVIS WITH CONTRAST  Technique: Multidetector CT imaging of the abdomen and pelvis was  performed following the standard protocol during bolus  administration of intravenous contrast.  Contrast: 183mL OMNIPAQUE IOHEXOL 300 MG/ML SOLN  Comparison: CT abdomen pelvis of 05/27/2011  Findings: The lung bases are clear. The heart is mildly enlarged  and stable. A small epicardial lymph node is present without  enlargement. The liver enhances with no focal abnormality, and no  ductal dilatation is seen.  There is a midline ventral hernia present containing nondistended  portion of the stomach and transverse colon.  The gallbladder is unremarkable with no gallstones noted. The  pancreas is normal in size and the pancreatic duct is not dilated.  The adrenal glands and spleen are unremarkable. The stomach is  moderately fluid distended and does extend into the ventral hernia  as noted above. The kidneys  enhance with no calculus or mass and  on delayed images, the pelvocaliceal systems are unremarkable. The  abdominal aorta is normal in caliber with atheromatous change  present. A slightly prominent loop of small bowel is present in  the left upper quadrant of questionable significance most likely  related to peristalsis.  Within the midline of the pelvic abdominal wall there is a rounded  fluid collection present of 4.0 x 2.8 x 7.0 cm, containing a few  air bubbles. This area has an attenuation of 23 HU and is  suspicious for a small superficial abscess. Communication with  underlying bowel is difficult to assess but no contrast is seen  within this fluid collection. The urinary bladder is decompressed.  No fluid is noted within the pelvis. The uterus is normal in size.  There are rectosigmoid colonic diverticula present. The terminal  ileum is unremarkable. Degenerative disc disease is present at L5-  S1 and there is degenerative change throughout the facet joints of  the lower lumbar spine.  IMPRESSION:  1. Midline upper abdominal ventral hernia containing nondilated  portion of the stomach and transverse colon.  2. Superficial fluid collection in the midline of the mid pelvis  containing air bubbles possibly representing a superficial abscess.  No definite communication with underlying bowel is seen.   Assessment    Nonhealing abdominal wounds Incisional hernia    Plan     I had a long conversation again today with her. I told her again that I don't think that the hernia should be repaired or that  she would tolerate that. There is no good options for repair either. The wounds bother her a lot though. I think is a reasonable thing to try to open them to get them to heal by secondary intention. I told her there is a chance of injuring her bowel during this as well as a significant chance that  this they will not heal even after this. She would like to go ahead and try this just to  see if we can get these to heal. Her last CT scan had  a question of some air which I think is just in her wound problem like to repeat that prior to reoperation on her just to make sure that there is not an abnormality that would need to be dealt with.       Jessup Ogas 07/16/2012, 12:21 PM

## 2012-07-17 ENCOUNTER — Telehealth: Payer: Self-pay

## 2012-07-17 MED ORDER — ALPRAZOLAM 0.25 MG PO TABS: 0.2500 mg | ORAL_TABLET | Freq: Three times a day (TID) | ORAL | Status: DC | PRN

## 2012-07-17 NOTE — Telephone Encounter (Signed)
[facility] has sent refill request for alprazolam 3 times and they have not heard anything yet. I did find request in PA Pool at nurse's station and I told them we have it and will send it back asap. Patient is out so if we can do this sooner rather than later she would appreciate it.

## 2012-07-17 NOTE — Telephone Encounter (Signed)
rx printed and ready to send

## 2012-07-19 ENCOUNTER — Encounter (HOSPITAL_BASED_OUTPATIENT_CLINIC_OR_DEPARTMENT_OTHER): Payer: Medicare Other

## 2012-07-19 ENCOUNTER — Other Ambulatory Visit: Payer: Self-pay | Admitting: Physician Assistant

## 2012-07-21 ENCOUNTER — Encounter (HOSPITAL_COMMUNITY): Payer: Self-pay

## 2012-07-21 ENCOUNTER — Other Ambulatory Visit (INDEPENDENT_AMBULATORY_CARE_PROVIDER_SITE_OTHER): Payer: Self-pay | Admitting: General Surgery

## 2012-07-21 ENCOUNTER — Ambulatory Visit (HOSPITAL_COMMUNITY)
Admission: RE | Admit: 2012-07-21 | Discharge: 2012-07-21 | Disposition: A | Discharge status: Home / Self Care | Payer: Medicare Other | Source: Ambulatory Visit | Attending: General Surgery | Admitting: General Surgery

## 2012-07-21 DIAGNOSIS — K632 Fistula of intestine: Secondary | ICD-10-CM

## 2012-07-21 DIAGNOSIS — Z9889 Other specified postprocedural states: Secondary | ICD-10-CM | POA: Insufficient documentation

## 2012-07-21 DIAGNOSIS — K5730 Diverticulosis of large intestine without perforation or abscess without bleeding: Secondary | ICD-10-CM | POA: Insufficient documentation

## 2012-07-21 DIAGNOSIS — L98499 Non-pressure chronic ulcer of skin of other sites with unspecified severity: Secondary | ICD-10-CM

## 2012-07-21 DIAGNOSIS — K439 Ventral hernia without obstruction or gangrene: Secondary | ICD-10-CM | POA: Insufficient documentation

## 2012-07-21 DIAGNOSIS — R109 Unspecified abdominal pain: Secondary | ICD-10-CM | POA: Insufficient documentation

## 2012-07-21 LAB — CREATININE, SERUM
Creatinine, Ser: 0.94 mg/dL (ref 0.50–1.10)
GFR calc Af Amer: 69 mL/min — ABNORMAL LOW (ref >90)
GFR calc non Af Amer: 60 mL/min — ABNORMAL LOW (ref >90)

## 2012-07-21 MED ORDER — IOHEXOL 300 MG/ML  SOLN
80.0000 mL | Freq: Once | INTRAMUSCULAR | Status: AC | PRN
  Administered 2012-07-21: 80 mL via INTRAVENOUS

## 2012-08-05 ENCOUNTER — Telehealth (INDEPENDENT_AMBULATORY_CARE_PROVIDER_SITE_OTHER): Payer: Self-pay

## 2012-08-05 ENCOUNTER — Telehealth (INDEPENDENT_AMBULATORY_CARE_PROVIDER_SITE_OTHER): Payer: Self-pay | Admitting: General Surgery

## 2012-08-05 NOTE — Telephone Encounter (Signed)
Called pt to let her know that Dr Teresa [name] said we could go ahead and get her scheduled for surgery. I turned pt's surgical orders into scheduling for them to call Dr Leafy Ro office to coordinate the case. The pt understands.

## 2012-08-05 NOTE — Telephone Encounter (Signed)
Pt called to ask about her CT results and Dr. Cristal Generous plan going forward for her.  Please call her (use cell phone number:  [phone]) to let her know the plan.

## 2012-08-15 ENCOUNTER — Other Ambulatory Visit: Payer: Self-pay | Admitting: Physician Assistant

## 2012-08-16 ENCOUNTER — Encounter (HOSPITAL_BASED_OUTPATIENT_CLINIC_OR_DEPARTMENT_OTHER): Payer: Medicare Other | Attending: Plastic Surgery

## 2012-08-16 DIAGNOSIS — X58XXXA Exposure to other specified factors, initial encounter: Secondary | ICD-10-CM | POA: Insufficient documentation

## 2012-08-16 DIAGNOSIS — S31109A Unspecified open wound of abdominal wall, unspecified quadrant without penetration into peritoneal cavity, initial encounter: Secondary | ICD-10-CM | POA: Insufficient documentation

## 2012-08-16 DIAGNOSIS — L98499 Non-pressure chronic ulcer of skin of other sites with unspecified severity: Secondary | ICD-10-CM | POA: Insufficient documentation

## 2012-08-16 NOTE — Progress Notes (Signed)
[facility]  NAME:  Teresa [name], Teresa [name]                ACCOUNT NO.:  [id number]  MEDICAL RECORD NO.:  [id number]      DATE OF BIRTH:  10-13-1940  PHYSICIAN:  Theodoro Kos, DO       VISIT DATE:  08/16/2012                                  OFFICE VISIT   The patient is a 72 year old female who is here for followup on her abdominal ulcer.  Overall, it is stable and she is doing well.  The surgery is scheduled for the 31st, with Dr. Donne Hazel.  There has been no change in her medications or social history.  PHYSICAL EXAMINATION:  She is alert and oriented, cooperative, not in any acute distress.  She is pleasant.  Pupils are equal.  Extraocular muscles are intact.  No cervical lymphadenopathy.  Her breathing is unlabored and her heart is regular.  The wound seems to be filling in some, but has a little bit of hypergranulation and the silver nitrate was used on that area.  We will continue with the Silvercel, and we will see her back after her surgery.     Theodoro Kos, DO     CS/MEDQ  D:  08/16/2012  T:  08/16/2012  Job:  [id number]

## 2012-08-17 ENCOUNTER — Other Ambulatory Visit: Payer: Self-pay | Admitting: Internal Medicine

## 2012-08-17 NOTE — Telephone Encounter (Signed)
Please advise on Alprazolam renewal

## 2012-08-17 NOTE — Telephone Encounter (Signed)
Forward to PCP: Dr. Leward Quan

## 2012-08-17 NOTE — Telephone Encounter (Signed)
PT STATES WE REFILLED HER SYNTHROID MEDICINE BUT NOT HER ALPRAZOLAM AND SHE IS IN NEED. PLEASE CALL [phone]   [place]

## 2012-08-17 NOTE — Telephone Encounter (Signed)
Medication called to [facility] w/ advisement that I am authorizing enough medication for 2 weeks; last visit w/ me was Sept 2013. Pt was advised to sch appt back in Dec 2013 (she has been hospitalized).

## 2012-08-27 ENCOUNTER — Encounter (HOSPITAL_COMMUNITY): Payer: Self-pay | Admitting: Pharmacy Technician

## 2012-09-01 ENCOUNTER — Encounter: Payer: Self-pay | Admitting: Family Medicine

## 2012-09-01 ENCOUNTER — Ambulatory Visit (INDEPENDENT_AMBULATORY_CARE_PROVIDER_SITE_OTHER): Payer: Medicare Other | Admitting: Family Medicine

## 2012-09-01 VITALS — BP 124/76 | HR 76 | Temp 97.4°F | Resp 16 | Ht 62.0 in | Wt 166.0 lb

## 2012-09-01 DIAGNOSIS — R7309 Other abnormal glucose: Secondary | ICD-10-CM

## 2012-09-01 DIAGNOSIS — F411 Generalized anxiety disorder: Secondary | ICD-10-CM

## 2012-09-01 DIAGNOSIS — R7302 Impaired glucose tolerance (oral): Secondary | ICD-10-CM

## 2012-09-01 DIAGNOSIS — I10 Essential (primary) hypertension: Secondary | ICD-10-CM

## 2012-09-01 DIAGNOSIS — I498 Other specified cardiac arrhythmias: Secondary | ICD-10-CM

## 2012-09-01 DIAGNOSIS — E785 Hyperlipidemia, unspecified: Secondary | ICD-10-CM

## 2012-09-01 DIAGNOSIS — E039 Hypothyroidism, unspecified: Secondary | ICD-10-CM

## 2012-09-01 DIAGNOSIS — R001 Bradycardia, unspecified: Secondary | ICD-10-CM

## 2012-09-01 LAB — COMPREHENSIVE METABOLIC PANEL
Albumin: 4.1 g/dL (ref 3.5–5.2)
Alkaline Phosphatase: 129 U/L — ABNORMAL HIGH (ref 39–117)
BUN: 23 mg/dL (ref 6–23)
CO2: 24 mEq/L (ref 19–32)
Calcium: 9.4 mg/dL (ref 8.4–10.5)
Chloride: 107 mEq/L (ref 96–112)
Glucose, Bld: 96 mg/dL (ref 70–99)
Potassium: 4.6 mEq/L (ref 3.5–5.3)
Sodium: 139 mEq/L (ref 135–145)
Total Protein: 6.8 g/dL (ref 6.0–8.3)

## 2012-09-01 LAB — LIPID PANEL
Cholesterol: 171 mg/dL (ref 0–200)
HDL: 28 mg/dL — ABNORMAL LOW (ref >39)
LDL Cholesterol: 104 mg/dL — ABNORMAL HIGH (ref 0–99)
Triglycerides: 196 mg/dL — ABNORMAL HIGH (ref <150)

## 2012-09-01 LAB — POCT CBC
Granulocyte percent: 58.9 %G (ref 37–80)
HCT, POC: 38.2 % (ref 37.7–47.9)
Hemoglobin: 11.8 g/dL — AB (ref 12.2–16.2)
MCV: 96.5 fL (ref 80–97)
POC Granulocyte: 5.9 (ref 2–6.9)
POC LYMPH PERCENT: 33.9 %L (ref 10–50)
RBC: 3.96 M/uL — AB (ref 4.04–5.48)

## 2012-09-01 MED ORDER — LEVOTHYROXINE SODIUM 88 MCG PO TABS: 88.0000 ug | ORAL_TABLET | Freq: Every day | ORAL | Status: DC

## 2012-09-01 MED ORDER — ALPRAZOLAM 0.25 MG PO TABS: 0.2500 mg | ORAL_TABLET | Freq: Three times a day (TID) | ORAL | Status: DC | PRN

## 2012-09-01 NOTE — Patient Instructions (Addendum)
Hyperlipidemia - Plan: Lipid panel  Unspecified essential hypertension - Plan: POCT CBC, Comprehensive metabolic panel, TSH  Unspecified hypothyroidism - Plan: TSH, levothyroxine (SYNTHROID, LEVOTHROID) 88 MCG tablet  Glucose intolerance (impaired glucose tolerance) - Plan: POCT glycosylated hemoglobin (Hb A1C)  Heart rate slow - Plan: EKG 12-Lead  Generalized anxiety disorder - Plan: ALPRAZolam (XANAX) 0.25 MG tablet

## 2012-09-01 NOTE — Progress Notes (Signed)
[place], [place]  [address]   [phone]  Subjective:    Patient ID: Teresa [name], female    DOB: [birth year], 72 y.o.   MRN: [id number]  HPI This 72 y.o. female presents for evaluation of the following:  1.  Hypothyroidism: six month follow-up.  Needs refill.  Asymptomatic; cannot tell when thyroid levels are therapeutic or abnormal.  S/p thyroidectomy at age 38 for goiter toxic.  Reports compliance with medication; good tolerance to medication; good symptom control.  2.  Insomnia: takes 1-2 at bedtime for insomnia; takes one in afternoon if needed for anxiety.  Scheduled for surgery on 09/13/12; 04/2011 underwent bowel resection due to SBO; poor wound healing; s/p two revisions.  Undergoing surgery via general surgery and plastic surgery for debridement; will be discharged with open wound.    Increase anxiety. Husband died in 03/11/23.  Son had a CVA.  No other medication for anxiety; previously took Zoloft years ago for depression.  Mind races at night due to stressors.  Dr. Shelbie Proctor started pt on medication/Xanax; Dr. Glade Nurse only stayed two years.  Has seen Dr. Leward Quan and has also seen Dr. Carlota Raspberry.  Dosage of Xanax has not change.  Rarely takes during the day.    3.  HTN:  Not checking blood pressure.  Reports compliance with medication; good tolerance to medication; good symptom control.  Denies CP/palpitations; does suffer with SOB intermittently and mild intermittent leg swelling.  4.  Hyperlipidemia: no current treatment. Fasting today.    5. Chronic pain syndrome:  Followed by pain management; +spinal stenosis/ DDD lumbar and abdominal pain:  Managed by pain management.  6.  CHF: followed once to twice yearly for heart failure.  S/p echo in past six months.  Denies orthopnea, significant leg swelling.  Compliance with medications.  7.  DMII:  Diet controlled; previously weighed 200 pounds; weight decreased to 145 until last year; has gained weight in past year.  No  recent glucose monitoring. Previously took Metformin but was able to wean off of medication with weight loss.     Review of Systems  Constitutional: Positive for unexpected weight change. Negative for fever, chills, diaphoresis and fatigue.  Respiratory: Positive for shortness of breath. Negative for cough, wheezing and stridor.   Cardiovascular: Positive for leg swelling. Negative for chest pain and palpitations.  Gastrointestinal: Positive for abdominal pain.  Endocrine: Negative for cold intolerance, heat intolerance, polydipsia, polyphagia and polyuria.  Skin: Negative for rash and wound.  Neurological: Negative for dizziness, tremors, syncope, facial asymmetry, speech difficulty, weakness, light-headedness, numbness and headaches.  Psychiatric/Behavioral: Positive for sleep disturbance. Negative for suicidal ideas, self-injury and dysphoric mood. The patient is nervous/anxious.         Past Medical History  Diagnosis Date  . Nonischemic cardiomyopathy CARDIOLOGIST- DR [facility] - LAST VISIT  AUG 2012  (IN EPIC)    EF is 25% per echo February 2012, non ischemic myoview 12/2009.  EF 50% echo 7/12 and plans for ICD cancelled.   Marland Kitchen HTN (hypertension)   . PVC (premature ventricular contraction)   . Heart palpitations   . Valvular heart disease     Moderate to severe MR, moderate TR, LAE per echo 7/11  . Spinal stenosis   . Abdominal pain   . History of glaucoma   . Non-healing surgical wound ABDOMINAL S/P EXPLORATOY LAPAROTOMY 04-18-2011  . History of small bowel obstruction 2009 AND NOV 2012  . Peptic ulcer disease   .  GERD (gastroesophageal reflux disease)   . H/O CHF   . Moderate mitral regurgitation   . Moderate tricuspid regurgitation   . Arthritis FEET, KNEES, HANDS  . H/O hiatal hernia   . Urge urinary incontinence   . Left ovarian cyst   . Diabetes mellitus type 2, diet-controlled   . Shortness of breath   . Wears dentures     upper denture-lower partial    Past  Surgical History  Procedure Laterality Date  . Tubal ligation    . Glaucoma surgery  1978  . Exploratomy lap. / extensive lysis adhesions/ small bowel resection x2 with primary anastomosis x2  04-18-2011    SMALL BOWEL OBSTRUCTION  . Knee arthroscopy w/ meniscectomy  06-05-2004  . Transthoracic echocardiogram  12-27-2010    EF 45-50%/ MODERATE MV REGURG. / LEFT ATRIUM MILDLY DILATED/ MILD TRICUSPID REGURG.  . Cardiovascular stress test  JULY 2011-  DR HOCHREIN    NO ISCHEMIA  . Thyroidectomy  1978 GOITOR    AND CERVICAL COLD KNIFE CONE BX  . Tonsillectomy  AGE 89  . Benign right breast tumor removed    . Appendectomy  1979    RUPTURED  . Abdominal hernia repair  70 YRS AGO    PERITINITIS  . Ventral hernia repair  X4  LAST ONE 20 YRS AGO    ABDOMINAL --  EACH ONE IN DIFFERENT AREA  . Wound debridement  09/25/2011    Procedure: DEBRIDEMENT CLOSURE/ABDOMINAL WOUND;  Surgeon: Theodoro Kos, DO;  Location: [facility];  Service: Plastics;  Laterality: N/A;  excision of abdominal wound with primary closure  . Ventral hernia repair  01/29/2012    Procedure: HERNIA REPAIR VENTRAL ADULT;  Surgeon: Theodoro Kos, DO;  Location: [facility] [place];  Service: Plastics;  Laterality: N/A;  . Hernia repair      Prior to Admission medications   Medication Sig Start Date End Date Taking? Authorizing Provider  ALPRAZolam (XANAX) 0.25 MG tablet Take 0.25 mg by mouth 3 (three) times daily as needed for anxiety.   Yes Historical Provider, MD  carvedilol (COREG) 12.5 MG tablet Take 12.5 mg by mouth 2 (two) times daily with a meal.   Yes Historical Provider, MD  famotidine (PEPCID) 20 MG tablet Take 20-40 mg by mouth 2 (two) times daily as needed for heartburn.   Yes Historical Provider, MD  furosemide (LASIX) 40 MG tablet Take 40 mg by mouth daily.   Yes Historical Provider, MD  HYDROcodone-acetaminophen (NORCO/VICODIN) 5-325 MG per tablet Take 1 tablet by mouth every 6 (six) hours as  needed for pain.   Yes Historical Provider, MD  levothyroxine (SYNTHROID, LEVOTHROID) 88 MCG tablet Take 88 mcg by mouth daily. 08/15/12  Yes Ryan M Dunn, PA-C  losartan (COZAAR) 25 MG tablet Take 25 mg by mouth daily.   Yes Historical Provider, MD  spironolactone (ALDACTONE) 25 MG tablet Take 12.5 mg by mouth daily.   Yes Historical Provider, MD    Allergies  Allergen Reactions  . Aspirin Other (See Comments)    HX Bleeding Ulcer     History   Social History  . Marital Status: Widowed    Spouse Name: N/A    Number of Children: N/A  . Years of Education: N/A   Occupational History  . Not on file.   Social History Main Topics  . Smoking status: Former Smoker -- 30 years    Types: Cigarettes    Quit date: 09/22/2010  . Smokeless tobacco:  Never Used     Comment: quit 2011  . Alcohol Use: No  . Drug Use: No  . Sexually Active: Not Currently   Other Topics Concern  . Not on file   Social History Narrative   Marital status:  Widowed as of 2012; not dating.      Children: four children; six grandchildren; 5 gg.      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      Tobacco:  None; quit in 2012.      Alcohol: none      Drugs:none      Exercise:  No formal exercise program.    Family History  Problem Relation Age of Onset  . Stroke Brother   . Stroke Sister   . Heart disease Sister     Objective:   Physical Exam  Nursing note and vitals reviewed. Constitutional: She is oriented to person, place, and time. She appears well-developed and well-nourished. No distress.  HENT:  Head: Normocephalic and atraumatic.  Right Ear: External ear normal.  Left Ear: External ear normal.  Nose: Nose normal.  Mouth/Throat: Oropharynx is clear and moist.  Cerumen R ear canal.  Eyes: Conjunctivae and EOM are normal. Pupils are equal, round, and reactive to light.  Neck: Normal range of motion. Neck supple. No JVD present. No thyromegaly  present.  Cardiovascular: Bradycardia present.   Pulmonary/Chest: Effort normal and breath sounds normal. No respiratory distress. She has no wheezes. She has no rales.  Lymphadenopathy:    She has no cervical adenopathy.  Neurological: She is alert and oriented to person, place, and time.  Skin: Skin is warm and dry. No rash noted. She is not diaphoretic. No erythema.  Psychiatric: She has a normal mood and affect. Her behavior is normal.   EKG:  NSR; PVC.    Results for orders placed in visit on 09/01/12  POCT CBC      Result Value Range   WBC 10.0  4.6 - 10.2 K/uL   Lymph, poc 3.4  0.6 - 3.4   POC LYMPH PERCENT 33.9  10 - 50 %L   MID (cbc) 0.7  0 - 0.9   POC MID % 7.2  0 - 12 %M   POC Granulocyte 5.9  2 - 6.9   Granulocyte percent 58.9  37 - 80 %G   RBC 3.96 (*) 4.04 - 5.48 M/uL   Hemoglobin 11.8 (*) 12.2 - 16.2 g/dL   HCT, POC 38.2  37.7 - 47.9 %   MCV 96.5  80 - 97 fL   MCH, POC 29.8  27 - 31.2 pg   MCHC 30.9 (*) 31.8 - 35.4 g/dL   RDW, POC 13.5     Platelet Count, POC 289  142 - 424 K/uL   MPV 8.4  0 - 99.8 fL  POCT GLYCOSYLATED HEMOGLOBIN (HGB A1C)      Result Value Range   Hemoglobin A1C 5.6      Assessment & Plan:  Hyperlipidemia - Plan: Lipid panel  Unspecified essential hypertension - Plan: POCT CBC, Comprehensive metabolic panel, TSH  Unspecified hypothyroidism - Plan: TSH  Glucose intolerance (impaired glucose tolerance) - Plan: POCT glycosylated hemoglobin (Hb A1C)  Heart rate slow - Plan: EKG 12-Lead  Generalized anxiety disorder   1.  Anxiety: Stable; refill of Xanax provided.  Discussed treatment with SSRI but pt reluctant at this time; she has been stable on current regimen of Xanax  for many years.   2.  Hypothyroidism surgical:  Stable; obtain labs; refill provided. 3. HTN: controlled; no change in management. 4.  Glucose Intolerance: controlled with dietary modification. 5. Bradycardia: New. EKG stable in NSR. 6.  Hyperlipidemia: stable;  obtain labs; pt fasting today. 7. CHF: stable; euvolemic today.  Followed by cardiology every 6-12 months. 8.  Abdominal incision with poor wound healing: scheduled for debridement 09/13/12.  Meds ordered this encounter  Medications  . levothyroxine (SYNTHROID, LEVOTHROID) 88 MCG tablet    Sig: Take 1 tablet (88 mcg total) by mouth daily.    Dispense:  30 tablet    Refill:  5  . ALPRAZolam (XANAX) 0.25 MG tablet    Sig: Take 1 tablet (0.25 mg total) by mouth 3 (three) times daily as needed for anxiety.    Dispense:  90 tablet    Refill:  5

## 2012-09-03 ENCOUNTER — Encounter (HOSPITAL_COMMUNITY)
Admission: RE | Admit: 2012-09-03 | Discharge: 2012-09-03 | Disposition: A | Discharge status: Home / Self Care | Payer: Medicare Other | Source: Ambulatory Visit | Attending: General Surgery | Admitting: General Surgery

## 2012-09-03 NOTE — Pre-Procedure Instructions (Signed)
CHANY MONTS  09/03/2012   Your procedure is scheduled on:  Monday September 13, 2012  Report to [facility] at 0900 AM.  Call this number if you have problems the morning of surgery: [phone]   Remember:   Do not eat food or drink liquids after midnight.   Take these medicines the morning of surgery with A SIP OF WATER: Alprazolam(Xanax), Carvedilol(Coreg), Famotidine(Pepcid), Levothyroxine(Synthroid), and Hydrocodone-Acetaminophen if needed for pain.   Do not wear jewelry, make-up or nail polish.  Do not wear lotions, powders, or perfumes. You may wear deodorant.  Do not shave 48 hours prior to surgery.  Do not bring valuables to the hospital.  Contacts, dentures or bridgework may not be worn into surgery.  Leave suitcase in the car. After surgery it may be brought to your room.  For patients admitted to the hospital, checkout time is 11:00 AM the day of  discharge.   Patients discharged the day of surgery will not be allowed to drive  home.    Special Instructions: Shower using CHG 2 nights before surgery and the night before surgery.  If you shower the day of surgery use CHG.  Use special wash - you have one bottle of CHG for all showers.  You should use approximately 1/3 of the bottle for each shower.   Please read over the following fact sheets that you were given: Pain Booklet, Coughing and Deep Breathing, MRSA Information and Surgical Site Infection Prevention

## 2012-09-09 ENCOUNTER — Encounter (HOSPITAL_COMMUNITY)
Admission: RE | Admit: 2012-09-09 | Discharge: 2012-09-09 | Disposition: A | Discharge status: Home / Self Care | Payer: Medicare Other | Source: Ambulatory Visit | Attending: General Surgery | Admitting: General Surgery

## 2012-09-09 ENCOUNTER — Encounter (HOSPITAL_COMMUNITY): Payer: Self-pay

## 2012-09-09 HISTORY — DX: Hypothyroidism, unspecified: E03.9

## 2012-09-09 LAB — BASIC METABOLIC PANEL
Calcium: 9.9 mg/dL (ref 8.4–10.5)
Chloride: 102 mEq/L (ref 96–112)
Creatinine, Ser: 0.97 mg/dL (ref 0.50–1.10)
GFR calc Af Amer: 67 mL/min — ABNORMAL LOW (ref >90)
Sodium: 139 mEq/L (ref 135–145)

## 2012-09-09 LAB — SURGICAL PCR SCREEN
MRSA, PCR: NEGATIVE
Staphylococcus aureus: NEGATIVE

## 2012-09-09 LAB — CBC WITH DIFFERENTIAL/PLATELET
Basophils Absolute: 0.1 10*3/uL (ref 0.0–0.1)
HCT: 36.9 % (ref 36.0–46.0)
Lymphocytes Relative: 35 % (ref 12–46)
Lymphs Abs: 3.8 10*3/uL (ref 0.7–4.0)
Neutro Abs: 5.9 10*3/uL (ref 1.7–7.7)
Platelets: 239 10*3/uL (ref 150–400)
RBC: 3.94 MIL/uL (ref 3.87–5.11)
RDW: 13 % (ref 11.5–15.5)
WBC: 10.8 10*3/uL — ABNORMAL HIGH (ref 4.0–10.5)

## 2012-09-12 MED ORDER — DEXTROSE 5 % IV SOLN
2.0000 g | INTRAVENOUS | Status: AC
  Administered 2012-09-13: 2 g via INTRAVENOUS
  Filled 2012-09-12: qty 2

## 2012-09-13 ENCOUNTER — Observation Stay (HOSPITAL_COMMUNITY)
Admission: RE | Admit: 2012-09-13 | Discharge: 2012-09-14 | Disposition: A | Discharge status: Home / Self Care | Payer: Medicare Other | Source: Ambulatory Visit | Attending: General Surgery | Admitting: General Surgery

## 2012-09-13 ENCOUNTER — Encounter (HOSPITAL_COMMUNITY)
Admission: RE | Disposition: A | Discharge status: Home / Self Care | Payer: Self-pay | Source: Ambulatory Visit | Attending: General Surgery

## 2012-09-13 ENCOUNTER — Inpatient Hospital Stay (HOSPITAL_COMMUNITY): Payer: Medicare Other | Admitting: Certified Registered"

## 2012-09-13 ENCOUNTER — Encounter (HOSPITAL_COMMUNITY): Payer: Self-pay | Admitting: Certified Registered"

## 2012-09-13 ENCOUNTER — Encounter (HOSPITAL_COMMUNITY): Payer: Self-pay | Admitting: *Deleted

## 2012-09-13 DIAGNOSIS — I498 Other specified cardiac arrhythmias: Secondary | ICD-10-CM

## 2012-09-13 DIAGNOSIS — T8189XA Other complications of procedures, not elsewhere classified, initial encounter: Principal | ICD-10-CM | POA: Insufficient documentation

## 2012-09-13 DIAGNOSIS — L02219 Cutaneous abscess of trunk, unspecified: Secondary | ICD-10-CM

## 2012-09-13 DIAGNOSIS — I5022 Chronic systolic (congestive) heart failure: Secondary | ICD-10-CM | POA: Insufficient documentation

## 2012-09-13 DIAGNOSIS — L03319 Cellulitis of trunk, unspecified: Secondary | ICD-10-CM

## 2012-09-13 DIAGNOSIS — I5033 Acute on chronic diastolic (congestive) heart failure: Secondary | ICD-10-CM

## 2012-09-13 DIAGNOSIS — I10 Essential (primary) hypertension: Secondary | ICD-10-CM | POA: Insufficient documentation

## 2012-09-13 DIAGNOSIS — I2510 Atherosclerotic heart disease of native coronary artery without angina pectoris: Secondary | ICD-10-CM | POA: Insufficient documentation

## 2012-09-13 DIAGNOSIS — I471 Supraventricular tachycardia: Secondary | ICD-10-CM

## 2012-09-13 DIAGNOSIS — T85898A Other specified complication of other internal prosthetic devices, implants and grafts, initial encounter: Secondary | ICD-10-CM

## 2012-09-13 DIAGNOSIS — T8579XA Infection and inflammatory reaction due to other internal prosthetic devices, implants and grafts, initial encounter: Secondary | ICD-10-CM | POA: Insufficient documentation

## 2012-09-13 DIAGNOSIS — I509 Heart failure, unspecified: Secondary | ICD-10-CM

## 2012-09-13 DIAGNOSIS — E119 Type 2 diabetes mellitus without complications: Secondary | ICD-10-CM | POA: Insufficient documentation

## 2012-09-13 DIAGNOSIS — I428 Other cardiomyopathies: Secondary | ICD-10-CM | POA: Insufficient documentation

## 2012-09-13 DIAGNOSIS — Y836 Removal of other organ (partial) (total) as the cause of abnormal reaction of the patient, or of later complication, without mention of misadventure at the time of the procedure: Secondary | ICD-10-CM | POA: Insufficient documentation

## 2012-09-13 HISTORY — DX: Chronic systolic (congestive) heart failure: I50.22

## 2012-09-13 HISTORY — PX: INCISION AND DRAINAGE OF WOUND: SHX1803

## 2012-09-13 LAB — CBC
HCT: 35 % — ABNORMAL LOW (ref 36.0–46.0)
Hemoglobin: 11.8 g/dL — ABNORMAL LOW (ref 12.0–15.0)
MCH: 30.8 pg (ref 26.0–34.0)
MCHC: 33.7 g/dL (ref 30.0–36.0)
MCV: 91.4 fL (ref 78.0–100.0)
Platelets: 212 K/uL (ref 150–400)
RBC: 3.83 MIL/uL — ABNORMAL LOW (ref 3.87–5.11)
RDW: 13 % (ref 11.5–15.5)
WBC: 9.5 K/uL (ref 4.0–10.5)

## 2012-09-13 LAB — BASIC METABOLIC PANEL
BUN: 18 mg/dL (ref 6–23)
CO2: 26 mEq/L (ref 19–32)
Chloride: 108 mEq/L (ref 96–112)
GFR calc non Af Amer: 66 mL/min — ABNORMAL LOW (ref >90)
Glucose, Bld: 94 mg/dL (ref 70–99)
Potassium: 4.7 mEq/L (ref 3.5–5.1)
Sodium: 141 mEq/L (ref 135–145)

## 2012-09-13 SURGERY — IRRIGATION AND DEBRIDEMENT WOUND
Anesthesia: General | Site: Abdomen | Wound class: Dirty or Infected

## 2012-09-13 MED ORDER — FENTANYL CITRATE 0.05 MG/ML IJ SOLN
INTRAMUSCULAR | Status: DC | PRN
  Administered 2012-09-13 (×3): 50 ug via INTRAVENOUS

## 2012-09-13 MED ORDER — EPHEDRINE SULFATE 50 MG/ML IJ SOLN
INTRAMUSCULAR | Status: DC | PRN
  Administered 2012-09-13 (×2): 5 mg via INTRAVENOUS

## 2012-09-13 MED ORDER — CARVEDILOL 12.5 MG PO TABS
12.5000 mg | ORAL_TABLET | Freq: Two times a day (BID) | ORAL | Status: DC
  Administered 2012-09-13 – 2012-09-14 (×2): 12.5 mg via ORAL
  Filled 2012-09-13 (×3): qty 1

## 2012-09-13 MED ORDER — ONDANSETRON HCL 4 MG/2ML IJ SOLN: 4.0000 mg | Freq: Four times a day (QID) | INTRAMUSCULAR | Status: DC | PRN

## 2012-09-13 MED ORDER — HYDROCODONE-ACETAMINOPHEN 5-325 MG PO TABS
1.0000 | ORAL_TABLET | Freq: Four times a day (QID) | ORAL | Status: DC | PRN
  Administered 2012-09-14 (×2): 1 via ORAL
  Filled 2012-09-13 (×2): qty 1

## 2012-09-13 MED ORDER — ACETAMINOPHEN 325 MG PO TABS: 650.0000 mg | ORAL_TABLET | Freq: Four times a day (QID) | ORAL | Status: DC | PRN

## 2012-09-13 MED ORDER — ROCURONIUM BROMIDE 100 MG/10ML IV SOLN
INTRAVENOUS | Status: DC | PRN
  Administered 2012-09-13: 50 mg via INTRAVENOUS

## 2012-09-13 MED ORDER — FENTANYL CITRATE 0.05 MG/ML IJ SOLN: 50.0000 ug | Freq: Once | INTRAMUSCULAR | Status: DC

## 2012-09-13 MED ORDER — BUPIVACAINE-EPINEPHRINE PF 0.25-1:200000 % IJ SOLN
INTRAMUSCULAR | Status: AC
  Filled 2012-09-13: qty 30

## 2012-09-13 MED ORDER — FAMOTIDINE 20 MG PO TABS: 20.0000 mg | ORAL_TABLET | Freq: Two times a day (BID) | ORAL | Status: DC | PRN

## 2012-09-13 MED ORDER — SCOPOLAMINE 1 MG/3DAYS TD PT72
1.0000 | MEDICATED_PATCH | Freq: Once | TRANSDERMAL | Status: DC
  Filled 2012-09-13: qty 1

## 2012-09-13 MED ORDER — ARTIFICIAL TEARS OP OINT
TOPICAL_OINTMENT | OPHTHALMIC | Status: DC | PRN
  Administered 2012-09-13: 1 via OPHTHALMIC

## 2012-09-13 MED ORDER — HEPARIN SODIUM (PORCINE) 5000 UNIT/ML IJ SOLN
5000.0000 [IU] | Freq: Three times a day (TID) | INTRAMUSCULAR | Status: DC
  Administered 2012-09-13 – 2012-09-14 (×2): 5000 [IU] via SUBCUTANEOUS
  Filled 2012-09-13 (×4): qty 1

## 2012-09-13 MED ORDER — SODIUM CHLORIDE 0.9 % IV SOLN: INTRAVENOUS | Status: DC

## 2012-09-13 MED ORDER — PROMETHAZINE HCL 25 MG/ML IJ SOLN: 6.2500 mg | INTRAMUSCULAR | Status: DC | PRN

## 2012-09-13 MED ORDER — LEVOTHYROXINE SODIUM 88 MCG PO TABS
88.0000 ug | ORAL_TABLET | Freq: Every day | ORAL | Status: DC
  Administered 2012-09-14: 88 ug via ORAL
  Filled 2012-09-13 (×2): qty 1

## 2012-09-13 MED ORDER — BUPIVACAINE-EPINEPHRINE 0.25% -1:200000 IJ SOLN
INTRAMUSCULAR | Status: DC | PRN
  Administered 2012-09-13: 30 mL

## 2012-09-13 MED ORDER — MIDAZOLAM HCL 5 MG/5ML IJ SOLN
INTRAMUSCULAR | Status: DC | PRN
  Administered 2012-09-13: 0.5 mg via INTRAVENOUS

## 2012-09-13 MED ORDER — LOSARTAN POTASSIUM 25 MG PO TABS
25.0000 mg | ORAL_TABLET | Freq: Every day | ORAL | Status: DC
  Administered 2012-09-13 – 2012-09-14 (×2): 25 mg via ORAL
  Filled 2012-09-13 (×2): qty 1

## 2012-09-13 MED ORDER — FUROSEMIDE 40 MG PO TABS
40.0000 mg | ORAL_TABLET | Freq: Every day | ORAL | Status: DC
  Administered 2012-09-13 – 2012-09-14 (×2): 40 mg via ORAL
  Filled 2012-09-13 (×2): qty 1

## 2012-09-13 MED ORDER — LACTATED RINGERS IV SOLN
INTRAVENOUS | Status: DC | PRN
  Administered 2012-09-13 (×2): via INTRAVENOUS

## 2012-09-13 MED ORDER — GLYCOPYRROLATE 0.2 MG/ML IJ SOLN
INTRAMUSCULAR | Status: DC | PRN
  Administered 2012-09-13: 0.6 mg via INTRAVENOUS
  Administered 2012-09-13: 0.2 mg via INTRAVENOUS

## 2012-09-13 MED ORDER — HYDROMORPHONE HCL PF 1 MG/ML IJ SOLN
INTRAMUSCULAR | Status: AC
  Filled 2012-09-13: qty 1

## 2012-09-13 MED ORDER — MORPHINE SULFATE 2 MG/ML IJ SOLN
2.0000 mg | INTRAMUSCULAR | Status: DC | PRN
  Administered 2012-09-13 – 2012-09-14 (×3): 2 mg via INTRAVENOUS
  Filled 2012-09-13 (×3): qty 1

## 2012-09-13 MED ORDER — LIDOCAINE HCL (CARDIAC) 20 MG/ML IV SOLN
INTRAVENOUS | Status: DC | PRN
  Administered 2012-09-13: 80 mg via INTRAVENOUS

## 2012-09-13 MED ORDER — NEOSTIGMINE METHYLSULFATE 1 MG/ML IJ SOLN
INTRAMUSCULAR | Status: DC | PRN
  Administered 2012-09-13: 4 mg via INTRAVENOUS

## 2012-09-13 MED ORDER — ACETAMINOPHEN 650 MG RE SUPP: 650.0000 mg | Freq: Four times a day (QID) | RECTAL | Status: DC | PRN

## 2012-09-13 MED ORDER — MIDAZOLAM HCL 2 MG/2ML IJ SOLN: 1.0000 mg | INTRAMUSCULAR | Status: DC | PRN

## 2012-09-13 MED ORDER — PROPOFOL 10 MG/ML IV BOLUS
INTRAVENOUS | Status: DC | PRN
  Administered 2012-09-13: 120 mg via INTRAVENOUS

## 2012-09-13 MED ORDER — 0.9 % SODIUM CHLORIDE (POUR BTL) OPTIME
TOPICAL | Status: DC | PRN
  Administered 2012-09-13: 1000 mL

## 2012-09-13 MED ORDER — LACTATED RINGERS IV SOLN
INTRAVENOUS | Status: DC
  Administered 2012-09-13: 10:00:00 via INTRAVENOUS

## 2012-09-13 MED ORDER — ONDANSETRON HCL 4 MG/2ML IJ SOLN
INTRAMUSCULAR | Status: DC | PRN
  Administered 2012-09-13: 4 mg via INTRAVENOUS

## 2012-09-13 MED ORDER — ALPRAZOLAM 0.25 MG PO TABS
0.2500 mg | ORAL_TABLET | Freq: Three times a day (TID) | ORAL | Status: DC | PRN
  Administered 2012-09-13: 0.25 mg via ORAL
  Filled 2012-09-13: qty 1

## 2012-09-13 MED ORDER — HYDROMORPHONE HCL PF 1 MG/ML IJ SOLN
0.2500 mg | INTRAMUSCULAR | Status: DC | PRN
  Administered 2012-09-13: 0.25 mg via INTRAVENOUS

## 2012-09-13 MED ORDER — SPIRONOLACTONE 12.5 MG HALF TABLET
12.5000 mg | ORAL_TABLET | Freq: Every day | ORAL | Status: DC
  Administered 2012-09-13: 12.5 mg via ORAL
  Filled 2012-09-13 (×2): qty 1

## 2012-09-13 SURGICAL SUPPLY — 40 items
BANDAGE GAUZE ELAST BULKY 4 IN (GAUZE/BANDAGES/DRESSINGS) ×1 IMPLANT
CANISTER SUCTION 2500CC (MISCELLANEOUS) ×2 IMPLANT
CLOTH BEACON ORANGE TIMEOUT ST (SAFETY) ×2 IMPLANT
CONT SPEC STER OR (MISCELLANEOUS) ×1 IMPLANT
COVER SURGICAL LIGHT HANDLE (MISCELLANEOUS) ×2 IMPLANT
DECANTER SPIKE VIAL GLASS SM (MISCELLANEOUS) ×1 IMPLANT
DRAPE LAPAROSCOPIC ABDOMINAL (DRAPES) ×1 IMPLANT
DRAPE PED LAPAROTOMY (DRAPES) IMPLANT
DRAPE UTILITY 15X26 W/TAPE STR (DRAPE) ×4 IMPLANT
DRSG PAD ABDOMINAL 8X10 ST (GAUZE/BANDAGES/DRESSINGS) ×1 IMPLANT
ELECT CAUTERY BLADE 6.4 (BLADE) ×2 IMPLANT
ELECT REM PT RETURN 9FT ADLT (ELECTROSURGICAL) ×2
ELECTRODE REM PT RTRN 9FT ADLT (ELECTROSURGICAL) ×1 IMPLANT
GLOVE BIO SURGEON STRL SZ 6.5 (GLOVE) ×2 IMPLANT
GLOVE BIO SURGEON STRL SZ7 (GLOVE) ×3 IMPLANT
GLOVE BIOGEL PI IND STRL 6.5 (GLOVE) IMPLANT
GLOVE BIOGEL PI IND STRL 7.0 (GLOVE) IMPLANT
GLOVE BIOGEL PI IND STRL 7.5 (GLOVE) ×1 IMPLANT
GLOVE BIOGEL PI INDICATOR 6.5 (GLOVE) ×1
GLOVE BIOGEL PI INDICATOR 7.0 (GLOVE) ×1
GLOVE BIOGEL PI INDICATOR 7.5 (GLOVE) ×1
GLOVE ECLIPSE 6.5 STRL STRAW (GLOVE) ×1 IMPLANT
GLOVE SURG SS PI 6.5 STRL IVOR (GLOVE) ×3 IMPLANT
GOWN STRL NON-REIN LRG LVL3 (GOWN DISPOSABLE) ×8 IMPLANT
KIT BASIN OR (CUSTOM PROCEDURE TRAY) ×2 IMPLANT
KIT ROOM TURNOVER OR (KITS) ×2 IMPLANT
NDL HYPO 25GX1X1/2 BEV (NEEDLE) IMPLANT
NEEDLE HYPO 25GX1X1/2 BEV (NEEDLE) ×2 IMPLANT
NS IRRIG 1000ML POUR BTL (IV SOLUTION) ×2 IMPLANT
PACK GENERAL/GYN (CUSTOM PROCEDURE TRAY) ×2 IMPLANT
PAD ARMBOARD 7.5X6 YLW CONV (MISCELLANEOUS) ×2 IMPLANT
SPECIMEN JAR SMALL (MISCELLANEOUS) ×2 IMPLANT
SPONGE GAUZE 4X4 12PLY (GAUZE/BANDAGES/DRESSINGS) ×1 IMPLANT
STRAP MONTGOMERY 1.25X11-1/8 (MISCELLANEOUS) ×2 IMPLANT
SUT MNCRL AB 4-0 PS2 18 (SUTURE) ×3 IMPLANT
SUT SILK 2 0 SH CR/8 (SUTURE) ×1 IMPLANT
SYR CONTROL 10ML LL (SYRINGE) ×1 IMPLANT
TAPE UMBILICAL 1/8 X36 TWILL (MISCELLANEOUS) ×1 IMPLANT
TOWEL OR 17X24 6PK STRL BLUE (TOWEL DISPOSABLE) ×2 IMPLANT
TOWEL OR 17X26 10 PK STRL BLUE (TOWEL DISPOSABLE) ×2 IMPLANT

## 2012-09-13 NOTE — Op Note (Signed)
No op note needed for [employer]

## 2012-09-13 NOTE — Progress Notes (Signed)
Patient admitted to [facility]. S/P I&D abdominal wall wound. Dressing intact. Telemetry applied. Heart rate irregular. Questionable SR vs Afib. Anticipating  Cardiology consult. Patient oriented to room. Call bell in reach.

## 2012-09-13 NOTE — Preoperative (Signed)
Beta Blockers   Reason not to administer Beta Blockers:Not Applicable 

## 2012-09-13 NOTE — Op Note (Signed)
Preoperative diagnosis: Open abdominal wall wounds Postoperative diagnosis: Prosthetic mesh infection Procedure: Incision and debridement of 2 abdominal wall cavities Excision of 4 x 4 centimeter prosthetic mesh Surgeon: Dr. Serita Grammes Co-surgeon Dr. Theodoro Kos Anesthesia: Gen. Estimated blood loss: Minimal Drains: None Complications: None Sponge and needle count correct at end of operation Disposition to recovery in stable condition  Indications: This is a 72 year old female who underwent a laparotomy with small bowel resection and repair of a ventral hernia with mesh. She is a long-time difficulty with wounds. These have been attempted to be cured for but they have persisted. I obtained a CT scan that she has had one previously there was a question of a fistula. She has got multiple opinions for this. I told her that I would not recommend fixing her hernia. We did discuss opening these wounds to see if we can get these to heal well. We plan on opening these wounds and healing by secondary intention. I were to do this in conjunction with Dr. Greig Castilla she knows her well from before.  Procedure: After informed consent was obtained the patient was taken to the operating room. She was given cefazolin. Sequential compression devices were on. She was placed under general anesthesia. She had a fair amount of ectopy and supraventricular tachycardia when she went to sleep. This was treated with lidocaine. This got better but then did recur at the end of the surgery. Her abdomen was then prepped and draped in the standard sterile surgical fashion. Surgical timeout was performed.  I approached the superior lesion that was a little bit to the left side of her abdomen first. I opened up this cavity with cautery. There were some old tissue as well as some biologic mesh with a small amount of prosthetic mesh that appeared to be causing the issue with this continuing to drain. I excised all of this I felt  comfortable at this time. He is difficult to tell what is her bowel at this point in time. I then irrigated this. I then went down to the bottom wound. I made this bigger than what it was. It ended up being about a 6 x 4 cm cavity. What there was down here was a cavity that appeared to be between either fascia or biologic mesh and permanent mesh that had a lot of necrotic tissue present in it. I initially thought might have gotten into the bowel but this proved not to be the case. I ended up debriding this whole area and excising it. There still is more biologic and possibly more permanent mesh there thought this area was causing the problem. It would be a significant amount or surgery to remove all of this at this point so I decided to stop. There was not an enterotomy that I can tell at this point either. In conjunction with Dr. Migdalia Dk we then closed some of the biologic mesh to itself with Monocryl suture in both locations. I then placed wet-to-dry dressings. A field block was performed by Dr. Migdalia Dk. She was brought to recovery. I will have cardiology see her and plan on keeping her overnight.

## 2012-09-13 NOTE — Progress Notes (Signed)
BNP> 800. Heart rate remains irregular with occasional PVCs and PACs. Patient remains asymptomatic. Ronda P.A. with cardiology service notified. Order to contiue monitoring and daily weights and obtain EKG if concerned of possible change to afib. EKG done. Reading sinus tachycardia

## 2012-09-13 NOTE — Anesthesia Preprocedure Evaluation (Addendum)
Anesthesia Evaluation  Patient identified by MRN, date of birth, ID band Patient awake    Reviewed: Allergy & Precautions, H&P , NPO status , Patient's Chart, lab work & pertinent test results  Airway Mallampati: II TM Distance: <3 FB Neck ROM: Full    Dental no notable dental hx. (+) Dental Advidsory Given   Pulmonary neg pulmonary ROS, shortness of breath, COPDformer smoker,  + rhonchi   Pulmonary exam normal       Cardiovascular hypertension, Pt. on medications +CHF + Valvular Problems/Murmurs MR Rhythm:Regular Rate:Normal  Cardiomyopathy EF 50%   Neuro/Psych Anxiety negative neurological ROS     GI/Hepatic Neg liver ROS, hiatal hernia, PUD, GERD-  Medicated and Controlled,  Endo/Other  diabetesHypothyroidism   Renal/GU negative Renal ROS  negative genitourinary   Musculoskeletal negative musculoskeletal ROS (+)   Abdominal   Peds negative pediatric ROS (+)  Hematology negative hematology ROS (+)   Anesthesia Other Findings   Reproductive/Obstetrics negative OB ROS                          Anesthesia Physical Anesthesia Plan  ASA: III  Anesthesia Plan: General   Post-op Pain Management:    Induction: Intravenous  Airway Management Planned: Oral ETT  Additional Equipment:   Intra-op Plan:   Post-operative Plan: Extubation in OR  Informed Consent: I have reviewed the patients History and Physical, chart, labs and discussed the procedure including the risks, benefits and alternatives for the proposed anesthesia with the patient or authorized representative who has indicated his/her understanding and acceptance.   Dental Advisory Given  Plan Discussed with: CRNA, Surgeon and Anesthesiologist  Anesthesia Plan Comments:        Anesthesia Quick Evaluation

## 2012-09-13 NOTE — H&P (Signed)
72 yof who was previously healthy and active but then required a laprotomy with sbr. She had open wound postop. This was complicated by fascial dehiscence and necrosis. Eventually she left hospital but has had some sort of wound since then. She has been managed by Dr. Migdalia Dk of plastic surgery for these wounds. She has had multiple procedures to try and get her wounds to heal. She also has a resulting large ventral hernia with her fascial edges pretty close to being near her ASIS bilaterally and her abdomen has began to bulge more as time has passed. She has no difficulty in having bms, eating, or with any n/v. She is limited in her activity by both the wounds that she does dressing changes on and the fact that her abominal wall does not exist any more and this is making activities more difficult. She has been seen by Dr. Lilyan Punt in my practice and he referred her to see someone in [facility] about this as well who recommended a binder to her.  I think the first problem is trying to get the wounds to heal. I think opening these laterally in the cavities that are there is the best plan. I would open these away from midline and try to get them to heal by secondary intention. I would attempt this prior to any more involved attempt. I think if these healed and she did not have to do dressing changes she would be much happier.  The hernia is another issue. After a long conversation about how this bothers her and what her goals would be I don't know that she would be better off having this repaired. We discussed component separation, combination with plastics but this all still would require some bridging with mesh and risk of infection as well as having the bridge of mesh which could still be significant. I am going to discuss with Dr.Sanger and I would be happy to do this along with her to see if we can get wounds healed first. I don't think she needs hernia fixed to get wounds to heal.  She has had no real  changes since our last visit.   Past Medical History   Diagnosis  Date   .  Nonischemic cardiomyopathy  CARDIOLOGIST- DR [facility] - LAST VISIT AUG 2012 (IN EPIC)     EF is 25% per echo February 2012, non ischemic myoview 12/2009. EF 50% echo 7/12 and plans for ICD cancelled.   Marland Kitchen  HTN (hypertension)    .  PVC (premature ventricular contraction)    .  Heart palpitations    .  Valvular heart disease      Moderate to severe MR, moderate TR, LAE per echo 7/11   .  Spinal stenosis    .  Abdominal pain    .  History of glaucoma    .  Non-healing surgical wound  ABDOMINAL S/P EXPLORATOY LAPAROTOMY 04-18-2011   .  History of small bowel obstruction  2009 AND NOV 2012   .  Peptic ulcer disease    .  GERD (gastroesophageal reflux disease)    .  H/O CHF    .  Moderate mitral regurgitation    .  Moderate tricuspid regurgitation    .  Arthritis  FEET, KNEES, HANDS   .  H/O hiatal hernia    .  Urge urinary incontinence    .  Left ovarian cyst    .  Diabetes mellitus type 2, diet-controlled    .  Shortness of breath    .  Wears dentures      upper denture-lower partial    Past Surgical History   Procedure  Date   .  Tubal ligation    .  Glaucoma surgery  1978   .  Exploratomy lap. / extensive lysis adhesions/ small bowel resection x2 with primary anastomosis x2  04-18-2011     SMALL BOWEL OBSTRUCTION   .  Knee arthroscopy w/ meniscectomy  06-05-2004   .  Transthoracic echocardiogram  12-27-2010     EF 45-50%/ MODERATE MV REGURG. / LEFT ATRIUM MILDLY DILATED/ MILD TRICUSPID REGURG.   .  Cardiovascular stress test  JULY 2011- DR HOCHREIN     NO ISCHEMIA   .  Thyroidectomy  1978 GOITOR     AND CERVICAL COLD KNIFE CONE BX   .  Tonsillectomy  AGE 72   .  Benign right breast tumor removed    .  Appendectomy  1979     RUPTURED   .  Abdominal hernia repair  26 YRS AGO     PERITINITIS   .  Ventral hernia repair  X4 LAST ONE 20 YRS AGO     ABDOMINAL -- EACH ONE IN DIFFERENT AREA   .  Wound  debridement  09/25/2011     Procedure: DEBRIDEMENT CLOSURE/ABDOMINAL WOUND; Surgeon: Theodoro Kos, DO; Location: [place]; Service: Plastics; Laterality: N/A; excision of abdominal wound with primary closure   .  Ventral hernia repair  01/29/2012     Procedure: HERNIA REPAIR VENTRAL ADULT; Surgeon: Theodoro Kos, DO; Location: [place]; Service: Plastics; Laterality: N/A;   .  Hernia repair     Family History   Problem  Relation  Age of Onset   .  Stroke  Brother    .  Stroke  Sister    .  Heart disease  Sister    Social History  History   Substance Use Topics   .  Smoking status:  Former Smoker -- 30 years     Types:  Cigarettes     Quit date:  09/22/2010   .  Smokeless tobacco:  Never Used      Comment: quit 2011   .  Alcohol Use:  No    Allergies   Allergen  Reactions   .  Aspirin  Other (See Comments)     HX Bleeding Ulcer    Current Outpatient Prescriptions   Medication  Sig  Dispense  Refill   .  ALDACTONE 25 MG tablet  TAKE (1/2) TABLET DAILY.  45 tablet  2   .  ALPRAZolam (XANAX) 0.25 MG tablet  Take 1 tablet (0.25 mg total) by mouth 3 (three) times daily as needed for anxiety.  90 tablet  0   .  COREG 12.5 MG tablet  TAKE 1 TABLET TWICE DAILY WITH A MEAL.  60 each  9   .  [facility] ZINC PO  Take 1 tablet by mouth daily.     .  furosemide (LASIX) 40 MG tablet  TAKE 1 OR 2 TABLETS DAILY AS NEEDED.  60 tablet  2   .  HYDROcodone-acetaminophen (VICODIN) 5-500 MG per tablet  Take 1 tablet by mouth every 6 (six) hours as needed for pain.  30 tablet  0   .  levothyroxine (SYNTHROID, LEVOTHROID) 88 MCG tablet  Take 1 tablet (88 mcg total) by mouth daily. Need office visit for additional refills.  30 tablet  0   .  losartan (COZAAR) 25 MG tablet  TAKE 1 TABLET DAILY.  30 tablet  2   .  Multiple Vitamin (MULTIVITAMIN) tablet  Take 1 tablet by mouth daily.     .  naphazoline (CLEAR EYES) 0.012 % ophthalmic solution  Place 1 drop into both eyes as needed.  For blood shot eyes     .  NEXIUM 40 MG capsule  TAKE (1) CAPSULE DAILY.  30 each  3   .  potassium chloride SA (K-DUR,KLOR-CON) 20 MEQ tablet  Take 20 mEq by mouth daily.     .  simethicone (MYLICON) 80 MG chewable tablet  Chew 80 mg by mouth every 6 (six) hours as needed. Gas pain     Review of Systems  Review of Systems  Blood pressure 128/80, pulse 76, temperature 97.8 F (36.6 C), temperature source Temporal, resp. rate 12, height 5' 3.5" (1.613 m), weight 166 lb 12.8 oz (75.66 kg).  Physical Exam  Physical Exam  Vitals reviewed.  Constitutional: She appears well-developed and well-nourished.  Abdominal: Soft. Bowel sounds are normal. There is no tenderness. A hernia is present. Hernia confirmed positive in the ventral area.    Lymphadenopathy:  She has no cervical adenopathy.  Data Reviewed  CT ABDOMEN AND PELVIS WITH CONTRAST  Technique: Multidetector CT imaging of the abdomen and pelvis was  performed following the standard protocol during bolus  administration of intravenous contrast.  Contrast: 59mL OMNIPAQUE IOHEXOL 300 MG/ML SOLN  Comparison: 01/16/2012  Findings: Previously seen fluid collection within the lower midline  anterior abdominal wall subcutaneous tissues is no longer seen. A  new hernia is seen near the site in the suprapubic region which is  broad-based and contains a small bowel loops. There is no evidence  of small bowel obstruction or ischemia.  A rim enhancing low attenuation collection is seen along the  lateral aspect the right hepatic lobe which is minimally increased  in size, now measuring approximately 1.0 x 4.3 cm. This could  represent postoperative collection or abscess. No other fluid  collections are identified within the abdomen or pelvis. Sigmoid  diverticulosis is noted, however there is no evidence of  diverticulitis. Uterus and adnexal regions are unremarkable in  appearance.  No liver masses are identified. The gallbladder, pancreas,  spleen,  adrenal glands, and kidneys are normal in appearance. No evidence  of hydronephrosis. No other soft tissue masses or lymphadenopathy  identified.  IMPRESSION:  1. Previously seen fluid collection in the midline lower anterior  abdominal wall subcutaneous tissues is no longer visualized.  2. New suprapubic midline anterior abdominal wall hernia  containing small bowel. No evidence of small bowel obstruction or  ischemia.  3. Minimal increase in size remains fluid collection along the  lateral aspect the right hepatic lobe now measuring 1 x 4 cm. This  could represent a postop fluid collection or abscess.  4. Diverticulosis. No radiographic evidence of diverticulitis  Assessment  Nonhealing abdominal wounds  Incisional hernia  Plan  I had a long conversation again today with her. I told her again that I don't think that the hernia should be repaired or that she would tolerate that. There is no good options for repair either. The wounds bother her a lot though. I think is a reasonable thing to try to open them to get them to heal by secondary intention. I told her there is a chance of injuring her bowel during this as well as a significant chance that this they will  not heal even after this. She would like to go ahead and try this just to see if we can get these to heal. Will plan on doing in conjunction with Dr Migdalia Dk who has operated on her abdominal wall before

## 2012-09-13 NOTE — Anesthesia Postprocedure Evaluation (Signed)
  Anesthesia Post-op Note  Patient: Teresa [name]  Procedure(s) Performed: Procedure(s) with comments: INCISION  AND DEBRIDEMENT WOUND abdominal wall  (N/A) - coordination with Dr Migdalia Dk   Patient Location: PACU  Anesthesia Type:General  Level of Consciousness: awake  Airway and Oxygen Therapy: Patient Spontanous Breathing  Post-op Pain: mild  Post-op Assessment: Post-op Vital signs reviewed  Post-op Vital Signs: Reviewed  Complications: No apparent anesthesia complications

## 2012-09-13 NOTE — Consult Note (Addendum)
CARDIOLOGY CONSULT NOTE   Patient ID: Teresa [name] MRN: [id number] DOB/AGE: 07/07/1940 38 y.o.  Admit date: 09/13/2012  Primary Physician   [name], MD Primary Cardiologist  SK/JH Reason for Consultation  Possible Afib  HPI: Teresa [name] is a 72 year old female without history of CAD but with a history of nonischemic cardiomyopathy (Echo 6/13 - EF 50-55%, mild LVH, mild AR and MR, mild LA dilation), systolic CHF (EF 123456 by echo 2/12), HTN, GERD, hypothyroidism, and DM who presents postoperatively with afib/slow ventricular response. She underwent an abdominal wall wound I&D procedure earlier today. Per Dr. Cristal Generous op note: "She had a fair amount of ectopy and supraventricular tachycardia when she went to sleep...did recur at the end of the surgery."  Cardiology was asked to consult.   Currently she complains of mild abdominal pain but denies chest pain, palpitations, HA, lightheadedness, dizziness, or SOB.   Arrhythmias seen on tele/EKG:  During operation - SVT Postop (13:00) - sinus tachycardia with PACs Now (16:00) - NSR with PACs   Past Medical History  Diagnosis Date  . Nonischemic cardiomyopathy CARDIOLOGIST- DR [facility] - LAST VISIT  AUG 2012  (IN EPIC)    EF is 25% per echo February 2012, non ischemic myoview 12/2009.  EF 50% echo 7/12 and plans for ICD cancelled.   Marland Kitchen HTN (hypertension)   . PVC (premature ventricular contraction)   . Heart palpitations   . Valvular heart disease     Moderate to severe MR, moderate TR, LAE per echo 7/11  . Spinal stenosis   . Abdominal pain   . History of glaucoma   . Non-healing surgical wound ABDOMINAL S/P EXPLORATOY LAPAROTOMY 04-18-2011  . History of small bowel obstruction 2009 AND NOV 2012  . Peptic ulcer disease   . GERD (gastroesophageal reflux disease)   . H/O CHF   . Moderate mitral regurgitation   . Moderate tricuspid regurgitation   . Arthritis FEET, KNEES, HANDS  . H/O hiatal hernia   . Urge urinary  incontinence   . Left ovarian cyst   . Diabetes mellitus type 2, diet-controlled   . Shortness of breath   . Wears dentures     upper denture-lower partial  . Heart murmur   . Hypothyroidism      Past Surgical History  Procedure Laterality Date  . Tubal ligation    . Glaucoma surgery  1978  . Exploratomy lap. / extensive lysis adhesions/ small bowel resection x2 with primary anastomosis x2  04-18-2011    SMALL BOWEL OBSTRUCTION  . Knee arthroscopy w/ meniscectomy  06-05-2004  . Transthoracic echocardiogram  12-27-2010    EF 45-50%/ MODERATE MV REGURG. / LEFT ATRIUM MILDLY DILATED/ MILD TRICUSPID REGURG.  . Cardiovascular stress test  JULY 2011-  DR HOCHREIN    NO ISCHEMIA  . Thyroidectomy  1978 GOITOR    AND CERVICAL COLD KNIFE CONE BX  . Tonsillectomy  AGE 74  . Benign right breast tumor removed    . Appendectomy  1979    RUPTURED  . Abdominal hernia repair  56 YRS AGO    PERITINITIS  . Ventral hernia repair  X4  LAST ONE 20 YRS AGO    ABDOMINAL --  EACH ONE IN DIFFERENT AREA  . Wound debridement  09/25/2011    Procedure: DEBRIDEMENT CLOSURE/ABDOMINAL WOUND;  Surgeon: Theodoro Kos, DO;  Location: [place];  Service: Plastics;  Laterality: N/A;  excision of abdominal wound with primary closure  . Ventral hernia  repair  01/29/2012    Procedure: HERNIA REPAIR VENTRAL ADULT;  Surgeon: Theodoro Kos, DO;  Location: [place];  Service: Plastics;  Laterality: N/A;  . Hernia repair      Allergies  Allergen Reactions  . Aspirin Other (See Comments)    HX Bleeding Ulcer     I have reviewed the patient's current medications . HYDROmorphone       . lactated ringers 50 mL/hr at 09/13/12 1027     Prior to Admission medications   Medication Sig Start Date End Date Taking? Authorizing Provider  ALPRAZolam (XANAX) 0.25 MG tablet Take 1 tablet (0.25 mg total) by mouth 3 (three) times daily as needed for anxiety. 09/01/12  Yes Wardell Honour, MD   carvedilol (COREG) 12.5 MG tablet Take 12.5 mg by mouth 2 (two) times daily with a meal.   Yes Historical Provider, MD  famotidine (PEPCID) 20 MG tablet Take 20-40 mg by mouth 2 (two) times daily as needed for heartburn.   Yes Historical Provider, MD  furosemide (LASIX) 40 MG tablet Take 40 mg by mouth daily.   Yes Historical Provider, MD  HYDROcodone-acetaminophen (NORCO/VICODIN) 5-325 MG per tablet Take 1 tablet by mouth every 6 (six) hours as needed for pain.   Yes Historical Provider, MD  levothyroxine (SYNTHROID, LEVOTHROID) 88 MCG tablet Take 1 tablet (88 mcg total) by mouth daily. 09/01/12  Yes Wardell Honour, MD  losartan (COZAAR) 25 MG tablet Take 25 mg by mouth daily.   Yes Historical Provider, MD  spironolactone (ALDACTONE) 25 MG tablet Take 12.5 mg by mouth daily.   Yes Historical Provider, MD     History   Social History  . Marital Status: Widowed    Spouse Name: N/A    Number of Children: N/A  . Years of Education: N/A   Occupational History  . Not on file.   Social History Main Topics  . Smoking status: Former Smoker -- 30 years    Types: Cigarettes    Quit date: 09/22/2010  . Smokeless tobacco: Never Used     Comment: quit 2011  . Alcohol Use: No  . Drug Use: No  . Sexually Active: Not Currently   Other Topics Concern  . Not on file   Social History Narrative   Marital status:  Widowed as of 2012; not dating.      Children: four children; six grandchildren; 5 gg.      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      Tobacco:  None; quit in 2012.      Alcohol: none      Drugs:none      Exercise:  No formal exercise program.    Family Status  Relation Status Death Age  . Brother Deceased 59  . Father Deceased 39    unknown causes  . Mother Deceased   . Sister Deceased    Family History  Problem Relation Age of Onset  . Stroke Brother   . Stroke Sister   . Heart disease Sister      ROS:  Full 14 point  review of systems complete and found to be negative unless listed above.  Physical Exam: Blood pressure 127/55, pulse 39, temperature 96.7 F (35.9 C), temperature source Oral, resp. rate 16, SpO2 98.00%.  General: Well developed, well nourished, female in no acute distress Head: Eyes PERRLA, No xanthomas. Normocephalic and atraumatic, oropharynx without edema or exudate. Dentition:  poor Lungs: Slight crackles dependently.  Heart: HRRR S1 S2, no rub/gallop, Heart irregular rate and rhythm with S1, S2  murmur. pulses are 2+ extrem.  Trace ankle edema. Neck: No carotid bruits. No lymphadenopathy.  JVP 10 cm.  Abdomen: Bowel sounds present, abdomen soft and non-tender without masses or hernias noted. Msk:  No spine or cva tenderness. No weakness, no joint deformities or effusions. Extremities: No clubbing or cyanosis.   Neuro: Alert and oriented X 3. No focal deficits noted. Psych:  Good affect, responds appropriately Skin: No rashes or lesions noted.  Labs:   Lab Results  Component Value Date   WBC 10.8* 09/09/2012   HGB 12.4 09/09/2012   HCT 36.9 09/09/2012   MCV 93.7 09/09/2012   PLT 239 09/09/2012     Recent Labs Lab 09/09/12 1436  NA 139  K 4.0  CL 102  CO2 27  BUN 23  CREATININE 0.97  CALCIUM 9.9  GLUCOSE 92   Magnesium  Date Value Range Status  03/05/2012 1.8  1.5 - 2.5 mg/dL Final    Pro B Natriuretic peptide (BNP)  Date/Time Value Range Status  12/03/2011  3:02 PM 158.0* 0.0 - 100.0 pg/mL Final  04/16/2011  5:10 AM 1097.0* 0 - 125 pg/mL Final   Lab Results  Component Value Date   CHOL 171 09/01/2012   HDL 28* 09/01/2012   LDLCALC 104* 09/01/2012   TRIG 196* 09/01/2012    Lipase  Date/Time Value Range Status  05/27/2011  6:15 PM 20  11 - 59 U/L Final   TSH  Date/Time Value Range Status  09/01/2012  2:34 PM 0.441  0.350 - 4.500 uIU/mL Final    Echo: 0625/13 Study Conclusions: - Left ventricle: The cavity size was normal. Wall thickness was increased in a  pattern of mild LVH. Systolic function was normal. The estimated ejection fraction was in the range of 50% to 55%. Wall motion was normal; there were no regional wall motion abnormalities. Doppler parameters are consistent with abnormal left ventricular relaxation (grade 1 diastolic dysfunction). - Aortic valve: Mild regurgitation. - Mitral valve: Mild regurgitation. - Left atrium: The atrium was mildly dilated. - Pulmonary arteries: Systolic pressure was mildly increased. PA peak pressure: 28mm Hg (S).  ECG:  NSR with some PACs  ASSESSMENT AND PLAN:     1. Atrial arrhythmias - on tele, not definite afib but will give evening Coreg for rate control and monitor overnight. Consider anticoagulation after if afib found, per MD. 2. CHF - elevated volume status on exam, probable acute on chronic diastolic HF, last echo EF 50-55%. Will stop IV fluids, check BNP, repeat echo, and recheck volume status in the AM.   Signed: Stormy Card, [place] 09/13/2012 2:43 PM   Patient seen with PA, agree with the above note.  1. Atrial arrhythmias: I do not see definite atrial fibrillation, however she has frequent PACs and runs of probable atrial tachycardia.  With this, I would not be surprised to see atrial fibrillation with further monitoring.  For now, will go ahead and give her evening Coreg dose and continue to monitor closely.  If atrial fibrillation is found, she will likely need anticoagulation long-term. 2. CHF: Suspect a degree of acute on chronic diastolic CHF.  EF 50-55% on last echo.  She does appear somewhat volume overloaded with elevated JVP.   - Stop IV fluid as she is taking po. - Check BNP - Will repeat echo.   - Followup in the am, ?  If she will need more aggressive diuresis.   Loralie Champagne 09/13/2012 3:51 PM

## 2012-09-13 NOTE — H&P (Signed)
Teresa [name] is an 72 y.o. female.   Chief Complaint: chronic abdominal wound HPI: The patient is a 72 yrs old wf here for treatment of an abdominal ulcer that she has been dealing with for the past year.  She has undergone excision with closure twice in the past year.  She presents with breakdown in the area again.  There is concern about retained mesh or infection.  Past Medical History  Diagnosis Date  . Nonischemic cardiomyopathy CARDIOLOGIST- DR [facility] - LAST VISIT  AUG 2012  (IN EPIC)    EF is 25% per echo February 2012, non ischemic myoview 12/2009.  EF 50% echo 7/12 and plans for ICD cancelled.   Marland Kitchen HTN (hypertension)   . PVC (premature ventricular contraction)   . Heart palpitations   . Valvular heart disease     Moderate to severe MR, moderate TR, LAE per echo 7/11  . Spinal stenosis   . Abdominal pain   . History of glaucoma   . Non-healing surgical wound ABDOMINAL S/P EXPLORATOY LAPAROTOMY 04-18-2011  . History of small bowel obstruction 2009 AND NOV 2012  . Peptic ulcer disease   . GERD (gastroesophageal reflux disease)   . H/O CHF   . Moderate mitral regurgitation   . Moderate tricuspid regurgitation   . Arthritis FEET, KNEES, HANDS  . H/O hiatal hernia   . Urge urinary incontinence   . Left ovarian cyst   . Diabetes mellitus type 2, diet-controlled   . Shortness of breath   . Wears dentures     upper denture-lower partial  . Heart murmur   . Hypothyroidism     Past Surgical History  Procedure Laterality Date  . Tubal ligation    . Glaucoma surgery  1978  . Exploratomy lap. / extensive lysis adhesions/ small bowel resection x2 with primary anastomosis x2  04-18-2011    SMALL BOWEL OBSTRUCTION  . Knee arthroscopy w/ meniscectomy  06-05-2004  . Transthoracic echocardiogram  12-27-2010    EF 45-50%/ MODERATE MV REGURG. / LEFT ATRIUM MILDLY DILATED/ MILD TRICUSPID REGURG.  . Cardiovascular stress test  JULY 2011-  DR HOCHREIN    NO ISCHEMIA  . Thyroidectomy   1978 GOITOR    AND CERVICAL COLD KNIFE CONE BX  . Tonsillectomy  AGE 27  . Benign right breast tumor removed    . Appendectomy  1979    RUPTURED  . Abdominal hernia repair  43 YRS AGO    PERITINITIS  . Ventral hernia repair  X4  LAST ONE 20 YRS AGO    ABDOMINAL --  EACH ONE IN DIFFERENT AREA  . Wound debridement  09/25/2011    Procedure: DEBRIDEMENT CLOSURE/ABDOMINAL WOUND;  Surgeon: Theodoro Kos, DO;  Location: [facility];  Service: Plastics;  Laterality: N/A;  excision of abdominal wound with primary closure  . Ventral hernia repair  01/29/2012    Procedure: HERNIA REPAIR VENTRAL ADULT;  Surgeon: Theodoro Kos, DO;  Location: [facility];  Service: Plastics;  Laterality: N/A;  . Hernia repair      Family History  Problem Relation Age of Onset  . Stroke Brother   . Stroke Sister   . Heart disease Sister    Social History:  reports that she quit smoking about 1 years ago. Her smoking use included Cigarettes. She smoked 0.00 packs per day for 30 years. She has never used smokeless tobacco. She reports that she does not drink alcohol or use illicit drugs.  Allergies:  Allergies  Allergen Reactions  . Aspirin Other (See Comments)    HX Bleeding Ulcer     Medications Prior to Admission  Medication Sig Dispense Refill  . ALPRAZolam (XANAX) 0.25 MG tablet Take 1 tablet (0.25 mg total) by mouth 3 (three) times daily as needed for anxiety.  90 tablet  5  . carvedilol (COREG) 12.5 MG tablet Take 12.5 mg by mouth 2 (two) times daily with a meal.      . famotidine (PEPCID) 20 MG tablet Take 20-40 mg by mouth 2 (two) times daily as needed for heartburn.      . furosemide (LASIX) 40 MG tablet Take 40 mg by mouth daily.      Marland Kitchen HYDROcodone-acetaminophen (NORCO/VICODIN) 5-325 MG per tablet Take 1 tablet by mouth every 6 (six) hours as needed for pain.      Marland Kitchen levothyroxine (SYNTHROID, LEVOTHROID) 88 MCG tablet Take 1 tablet (88 mcg total) by mouth daily.  30 tablet   5  . losartan (COZAAR) 25 MG tablet Take 25 mg by mouth daily.      Marland Kitchen spironolactone (ALDACTONE) 25 MG tablet Take 12.5 mg by mouth daily.        No results found for this or any previous visit (from the past 48 hour(s)). No results found.  Review of Systems  Constitutional: Negative.   HENT: Negative.   Eyes: Negative.   Respiratory: Negative.   Cardiovascular: Negative.   Gastrointestinal: Negative.   Genitourinary: Negative.   Skin: Negative.   Psychiatric/Behavioral: Negative.     Blood pressure 122/77, pulse 80, temperature 97.4 F (36.3 C), temperature source Oral, resp. rate 20, SpO2 96.00%. Physical Exam  Constitutional: She appears well-developed and well-nourished.  HENT:  Head: Normocephalic and atraumatic.  Eyes: EOM are normal. Pupils are equal, round, and reactive to light.  Cardiovascular: Normal rate.   Respiratory: Effort normal.  GI: She exhibits distension. She exhibits no mass. There is tenderness. There is no rebound.  Musculoskeletal: Normal range of motion.  Neurological: She is alert.  Skin: Skin is warm.  Psychiatric: She has a normal mood and affect. Her behavior is normal. Thought content normal.     Assessment/Plan The abdominal ulcer - irrigation and debridement with general surgery.  SANGER,CLAIRE 09/13/2012, 12:46 PM

## 2012-09-13 NOTE — Transfer of Care (Signed)
Immediate Anesthesia Transfer of Care Note  Patient: Teresa [name]  Procedure(s) Performed: Procedure(s) with comments: INCISION  AND DEBRIDEMENT WOUND abdominal wall  (N/A) - coordination with Dr Migdalia Dk   Patient Location: PACU  Anesthesia Type:General  Level of Consciousness: awake, alert  and oriented  Airway & Oxygen Therapy: Patient Spontanous Breathing and Patient connected to nasal cannula oxygen  Post-op Assessment: Report given to PACU RN, Post -op Vital signs reviewed and stable and Patient moving all extremities X 4  Post vital signs: Reviewed and stable  Complications: No apparent anesthesia complications

## 2012-09-13 NOTE — Brief Op Note (Signed)
09/13/2012  12:49 PM  PATIENT:  Teresa [name]  72 y.o. female  PRE-OPERATIVE DIAGNOSIS:  abdominal wall wounds   POST-OPERATIVE DIAGNOSIS:  abdominal wall wounds   PROCEDURE:  Procedure(s) with comments: INCISION  AND DEBRIDEMENT WOUND abdominal wall  (N/A) - coordination with Dr Migdalia Dk   SURGEON:  Surgeon(s) and Role:    * Rolm Bookbinder, MD - Primary    * Theodoro Kos, DO  PHYSICIAN ASSISTANT: Shawn Rayburn, PA  ASSISTANTS: none   ANESTHESIA:   general  EBL:  Total I/O In: 1000 [I.V.:1000] Out: -   BLOOD ADMINISTERED:none  DRAINS: none   LOCAL MEDICATIONS USED:  MARCAINE     SPECIMEN:  Source of Specimen:  mesh  DISPOSITION OF SPECIMEN:  micro  COUNTS:  YES  TOURNIQUET:  * No tourniquets in log *  DICTATION: .Dragon Dictation  PLAN OF CARE: Admit for overnight observation  PATIENT DISPOSITION:  PACU - hemodynamically stable.   Delay start of Pharmacological VTE agent (>24hrs) due to surgical blood loss or risk of bleeding: no

## 2012-09-14 ENCOUNTER — Telehealth (INDEPENDENT_AMBULATORY_CARE_PROVIDER_SITE_OTHER): Payer: Self-pay | Admitting: General Surgery

## 2012-09-14 ENCOUNTER — Encounter (HOSPITAL_COMMUNITY): Payer: Self-pay | Admitting: General Surgery

## 2012-09-14 LAB — BASIC METABOLIC PANEL
BUN: 15 mg/dL (ref 6–23)
Chloride: 100 mEq/L (ref 96–112)
GFR calc Af Amer: 72 mL/min — ABNORMAL LOW (ref >90)
GFR calc non Af Amer: 62 mL/min — ABNORMAL LOW (ref >90)
Glucose, Bld: 105 mg/dL — ABNORMAL HIGH (ref 70–99)
Potassium: 3.8 mEq/L (ref 3.5–5.1)
Sodium: 138 mEq/L (ref 135–145)

## 2012-09-14 LAB — CBC
HCT: 36.4 % (ref 36.0–46.0)
Hemoglobin: 12.3 g/dL (ref 12.0–15.0)
MCH: 30.8 pg (ref 26.0–34.0)
RBC: 4 MIL/uL (ref 3.87–5.11)

## 2012-09-14 MED ORDER — HYDROCODONE-ACETAMINOPHEN 5-325 MG PO TABS: 1.0000 | ORAL_TABLET | Freq: Four times a day (QID) | ORAL | Status: DC | PRN

## 2012-09-14 NOTE — Progress Notes (Signed)
SUBJECTIVE:  Wants to go home.  No SOB.  Her back hurts and she has some soreness at her incision.    PHYSICAL EXAM Filed Vitals:   09/13/12 1800 09/13/12 2112 09/14/12 0224 09/14/12 0509  BP: 117/61 103/85 99/47 93/56   Pulse: 82 80 97 98  Temp: 97.7 F (36.5 C) 98.1 F (36.7 C) 99.1 F (37.3 C) 98.9 F (37.2 C)  TempSrc: Oral Oral Oral Oral  Resp: 20 18 16 18   Height:      Weight:      SpO2: 98% 99% 94% 95%   General:  No distress Lungs:  clear Heart:  Positive bowel sounds, no rebound no guarding Abdomen:  Bandaged.   Extremities:  No edema  LABS: Lab Results  Component Value Date   CKTOTAL 74 07/21/2012   CKMB 3.8 04/14/2008   TROPONINI <0.30 05/27/2011   Results for orders placed during the hospital encounter of 09/13/12 (from the past 24 hour(s))  TISSUE CULTURE     Status: None   Collection Time    09/13/12 12:40 PM      Result Value Range   Specimen Description TISSUE ABDOMEN     Special Requests NONE     Gram Stain       Value: NO WBC SEEN     NO SQUAMOUS EPITHELIAL CELLS SEEN     NO ORGANISMS SEEN   Culture NO GROWTH 1 DAY     Report Status PENDING    CBC     Status: Abnormal   Collection Time    09/13/12  3:38 PM      Result Value Range   WBC 9.5  4.0 - 10.5 K/uL   RBC 3.83 (*) 3.87 - 5.11 MIL/uL   Hemoglobin 11.8 (*) 12.0 - 15.0 g/dL   HCT 35.0 (*) 36.0 - 46.0 %   MCV 91.4  78.0 - 100.0 fL   MCH 30.8  26.0 - 34.0 pg   MCHC 33.7  30.0 - 36.0 g/dL   RDW 13.0  11.5 - 15.5 %   Platelets 212  150 - 400 K/uL  BASIC METABOLIC PANEL     Status: Abnormal   Collection Time    09/13/12  3:38 PM      Result Value Range   Sodium 141  135 - 145 mEq/L   Potassium 4.7  3.5 - 5.1 mEq/L   Chloride 108  96 - 112 mEq/L   CO2 26  19 - 32 mEq/L   Glucose, Bld 94  70 - 99 mg/dL   BUN 18  6 - 23 mg/dL   Creatinine, Ser 0.86  0.50 - 1.10 mg/dL   Calcium 9.0  8.4 - 10.5 mg/dL   GFR calc non Af Amer 66 (*) >90 mL/min   GFR calc Af Amer 77 (*) >90 mL/min  PRO  B NATRIURETIC PEPTIDE     Status: Abnormal   Collection Time    09/13/12  3:38 PM      Result Value Range   Pro B Natriuretic peptide (BNP) 854.3 (*) 0 - 125 pg/mL  BASIC METABOLIC PANEL     Status: Abnormal   Collection Time    09/14/12  6:00 AM      Result Value Range   Sodium 138  135 - 145 mEq/L   Potassium 3.8  3.5 - 5.1 mEq/L   Chloride 100  96 - 112 mEq/L   CO2 25  19 - 32 mEq/L   Glucose, Bld 105 (*)  70 - 99 mg/dL   BUN 15  6 - 23 mg/dL   Creatinine, Ser 0.91  0.50 - 1.10 mg/dL   Calcium 9.1  8.4 - 10.5 mg/dL   GFR calc non Af Amer 62 (*) >90 mL/min   GFR calc Af Amer 72 (*) >90 mL/min  CBC     Status: None   Collection Time    09/14/12  6:00 AM      Result Value Range   WBC 10.2  4.0 - 10.5 K/uL   RBC 4.00  3.87 - 5.11 MIL/uL   Hemoglobin 12.3  12.0 - 15.0 g/dL   HCT 36.4  36.0 - 46.0 %   MCV 91.0  78.0 - 100.0 fL   MCH 30.8  26.0 - 34.0 pg   MCHC 33.8  30.0 - 36.0 g/dL   RDW 13.1  11.5 - 15.5 %   Platelets 200  150 - 400 K/uL    Intake/Output Summary (Last 24 hours) at 09/14/12 0949 Last data filed at 09/14/12 0510  Gross per 24 hour  Intake   1660 ml  Output   2800 ml  Net  -1140 ml    ASSESSMENT AND PLAN:  CHF:  There was a question of possible volume overload with an element of diastolic dysfunction.  Pro BNP was mildly elevated. She received diuretic and had good UO last night.  No evidence of volume overload this AM.  OK to go home on previous meds.    Atrial arrhythmias:  No arrhythmias on telemetry overnight.  No further workup.  OK to discontinue telemetry.    Jeneen Rinks [facility] 09/14/2012 9:49 AM

## 2012-09-14 NOTE — Progress Notes (Signed)
Agree with the above plan 

## 2012-09-14 NOTE — Progress Notes (Signed)
1 Day Post-Op  Subjective: Feels ok, hungry wants to go home  Objective: Vital signs in last 24 hours: Temp:  [96.7 F (35.9 C)-99.1 F (37.3 C)] 98.9 F (37.2 C) (04/01 0509) Pulse Rate:  [28-98] 98 (04/01 0509) Resp:  [13-23] 18 (04/01 0509) BP: (93-135)/(43-106) 93/56 mmHg (04/01 0509) SpO2:  [94 %-100 %] 95 % (04/01 0509) Weight:  [171 lb 8.3 oz (77.8 kg)] 171 lb 8.3 oz (77.8 kg) (03/31 1443) Last BM Date: 09/13/12  Intake/Output from previous day: 03/31 0701 - 04/01 0700 In: 1660 [P.O.:260; I.V.:1400] Out: 2800 [Urine:2800] Intake/Output this shift:    General appearance: no distress Resp: clear to auscultation bilaterally Cardio: ?irregular GI: bs present wounds clean and open soft approp tender  Lab Results:   Recent Labs  09/13/12 1538 09/14/12 0600  WBC 9.5 10.2  HGB 11.8* 12.3  HCT 35.0* 36.4  PLT 212 200   BMET  Recent Labs  09/13/12 1538 09/14/12 0600  NA 141 138  K 4.7 3.8  CL 108 100  CO2 26 25  GLUCOSE 94 105*  BUN 18 15  CREATININE 0.86 0.91  CALCIUM 9.0 9.1    Assessment/Plan: POD 1  Will send home with bid dressing changes and follow up Appreciate cards seeing her, ? Afib, if ok with cardiology will dc home   [facility] 09/14/2012

## 2012-09-14 NOTE — Telephone Encounter (Signed)
Pt called to clarify how often to do wet-to-dry dressing changes at home; her discharge instructions were not specific.  Per VO Dr. Donne Hazel, do them BID.  Pt understands and will begin in the morning.

## 2012-09-14 NOTE — Progress Notes (Signed)
Orthopedic Tech Progress Note Patient Details:  LYNCOLN HOUSLER 1941-03-18 [id number]  Ortho Devices Type of Ortho Device: Abdominal binder Ortho Device/Splint Interventions: Ordered   Irish Elders 09/14/2012, 11:44 AM

## 2012-09-14 NOTE — Progress Notes (Signed)
Discharged to home

## 2012-09-14 NOTE — Progress Notes (Signed)
1 Day Post-Op  Subjective: Pt reports difficult night due to having to get OOB to urinate and Morphine made her anxious. She took oral pain medication earlier and states she was able to rest following this.  She is having some mild pain in the abdominal incisional area this am.  Some ectopy overnight and cardiology following for concerns of A fib.   Objective: Vital signs in last 24 hours: Temp:  [96.7 F (35.9 C)-99.1 F (37.3 C)] 98.9 F (37.2 C) (04/01 0509) Pulse Rate:  [28-98] 98 (04/01 0509) Resp:  [13-23] 18 (04/01 0509) BP: (93-135)/(43-106) 93/56 mmHg (04/01 0509) SpO2:  [94 %-100 %] 95 % (04/01 0509) Weight:  [77.8 kg (171 lb 8.3 oz)] 77.8 kg (171 lb 8.3 oz) (03/31 1443) Last BM Date: 09/13/12  Intake/Output from previous day: 03/31 0701 - 04/01 0700 In: 1660 [P.O.:260; I.V.:1400] Out: 2800 [Urine:2800] Intake/Output this shift:    General appearance: alert, cooperative and mild distress Resp: clear to auscultation bilaterally Cardio: irregularly irregular rhythm GI: +BS, soft, Incision/Wound: Dressings from the OR had minimal bloody drainage.   Lab Results:   Recent Labs  09/13/12 1538 09/14/12 0600  WBC 9.5 10.2  HGB 11.8* 12.3  HCT 35.0* 36.4  PLT 212 200   BMET  Recent Labs  09/13/12 1538 09/14/12 0600  NA 141 138  K 4.7 3.8  CL 108 100  CO2 26 25  GLUCOSE 94 105*  BUN 18 15  CREATININE 0.86 0.91  CALCIUM 9.0 9.1   PT/INR No results found for this basename: LABPROT, INR,  in the last 72 hours ABG No results found for this basename: PHART, PCO2, PO2, HCO3,  in the last 72 hours  Studies/Results: No results found.  Anti-infectives: Anti-infectives   Start     Dose/Rate Route Frequency Ordered Stop   09/13/12 0600  cefOXitin (MEFOXIN) 2 g in dextrose 5 % 50 mL IVPB     2 g 100 mL/hr over 30 Minutes Intravenous On call to O.R. 09/12/12 1522 09/13/12 1105      Assessment/Plan: s/p Procedure(s) with comments: INCISION  AND  DEBRIDEMENT WOUND abdominal wall  (N/A) - coordination with Dr Migdalia Dk  Continue dressing changes to the wound.  She can follow up in the [facility] and they will contact her concerning appointment for next Monday, April 7th, With Dr. Migdalia Dk.    LOS: 1 day    Ulysees Barns 09/14/2012 Plastic Surgery  [phone]

## 2012-09-16 LAB — TISSUE CULTURE

## 2012-09-16 NOTE — Discharge Summary (Signed)
Physician Discharge Summary  Patient ID: Teresa [name] MRN: [id number] DOB/AGE: 1940-08-27 72 y.o.  Admit date: 09/13/2012 Discharge date: 09/16/2012  Admission Diagnoses: Open abdominal wounds, chronic HTN CAD  Discharge Diagnoses:  Active Problems:   * No active hospital problems. *   Discharged Condition: good  Hospital Course: 89 yof who has long surgical history that has resulted in two open anterior abdominal wall nonhealing wounds. These wounds have been attempted to heal by conservative measures as well as returns to the or.  These have failed.  She also has loss of domain and large hernia which I told her should not be repaired now.  We discussed going to or to try and clean wounds up and debride tissue so it will heal.  I took her to or and debrided both wounds finding permanent mesh that appeared to be the source of this.  I debrided much of this that appeared to be causing problem in conjunction with Dr Migdalia Dk who has been caring for her wounds after having care turned over to her by another general surgeon. She did well overnight and was ready for discharge the following am.  Consults: None  Significant Diagnostic Studies: none  Treatments: surgery:     Disposition: 01-Home or Self Care   Future Appointments Provider Department Dept Phone   10/12/2012 1:30 PM Rolm Bookbinder, MD [facility] [place] [phone]       Medication List    TAKE these medications       ALPRAZolam 0.25 MG tablet  Commonly known as:  XANAX  Take 1 tablet (0.25 mg total) by mouth 3 (three) times daily as needed for anxiety.     carvedilol 12.5 MG tablet  Commonly known as:  COREG  Take 12.5 mg by mouth 2 (two) times daily with a meal.     famotidine 20 MG tablet  Commonly known as:  PEPCID  Take 20-40 mg by mouth 2 (two) times daily as needed for heartburn.     furosemide 40 MG tablet  Commonly known as:  LASIX  Take 40 mg by mouth daily.     HYDROcodone-acetaminophen 5-325 MG per tablet  Commonly known as:  NORCO/VICODIN  Take 1 tablet by mouth every 6 (six) hours as needed.     HYDROcodone-acetaminophen 5-325 MG per tablet  Commonly known as:  NORCO/VICODIN  Take 1 tablet by mouth every 6 (six) hours as needed for pain.     levothyroxine 88 MCG tablet  Commonly known as:  SYNTHROID, LEVOTHROID  Take 1 tablet (88 mcg total) by mouth daily.     losartan 25 MG tablet  Commonly known as:  COZAAR  Take 25 mg by mouth daily.     spironolactone 25 MG tablet  Commonly known as:  ALDACTONE  Take 12.5 mg by mouth daily.           Follow-up Information   Follow up with [facility], [place] In 3 weeks.   Contact information:   [address] [place] [address] [phone]       Signed: Rolm Bookbinder 09/16/2012, 2:08 PM

## 2012-09-21 ENCOUNTER — Other Ambulatory Visit: Payer: Self-pay | Admitting: Cardiology

## 2012-10-12 ENCOUNTER — Ambulatory Visit (INDEPENDENT_AMBULATORY_CARE_PROVIDER_SITE_OTHER): Payer: Medicare Other | Admitting: General Surgery

## 2012-10-12 ENCOUNTER — Encounter (INDEPENDENT_AMBULATORY_CARE_PROVIDER_SITE_OTHER): Payer: Self-pay | Admitting: General Surgery

## 2012-10-12 VITALS — BP 128/76 | HR 80 | Temp 98.0°F | Resp 16 | Ht 63.0 in | Wt 165.2 lb

## 2012-10-12 DIAGNOSIS — Z09 Encounter for follow-up examination after completed treatment for conditions other than malignant neoplasm: Secondary | ICD-10-CM

## 2012-10-12 NOTE — Progress Notes (Signed)
Subjective:     Patient ID: Teresa [name], female   DOB: [birth year], 72 y.o.   MRN: [id number]  HPI This is a 72 year old female whose history is well documented in her notes. She has a large abdominal wall hernia with nonhealing wound for about a year and a half. I took her to the operating room in conjunction with plastic surgery recently. I debrided these wounds and removed some old mesh as well as suture material. The pathology shows that this is benign ulcerated skin with underlying marked acute and chronic inflammation with granulation tissue. There is also embedded suture material present. She is doing very well now and returns today very happy. She is using a binder for her hernia this is really making this tolerable at this point. The lowermost wound essentially is healed and the uppermost one just has really some hypertrophic granulation tissue present at this point.  Review of Systems     Objective:   Physical Exam  Vitals reviewed. Abdominal: Soft. There is no tenderness.         Assessment:     Abdominal wall open wounds s/p debridement Abdominal wall hernia     Plan:     I did with some silver nitrate on the superior wound today and dressed these. She will see plastic surgery tomorrow again. Ill plan on seeing her back in about a month but I think these will finally healed now. She is doing well with her hernia and maintaining this with a binder.

## 2012-10-20 ENCOUNTER — Other Ambulatory Visit: Payer: Self-pay | Admitting: Cardiology

## 2012-11-12 ENCOUNTER — Encounter (INDEPENDENT_AMBULATORY_CARE_PROVIDER_SITE_OTHER): Payer: Medicare Other | Admitting: General Surgery

## 2012-11-15 ENCOUNTER — Encounter (INDEPENDENT_AMBULATORY_CARE_PROVIDER_SITE_OTHER): Payer: Self-pay | Admitting: General Surgery

## 2012-11-15 ENCOUNTER — Ambulatory Visit (INDEPENDENT_AMBULATORY_CARE_PROVIDER_SITE_OTHER): Payer: Medicare Other | Admitting: General Surgery

## 2012-11-15 VITALS — BP 128/78 | HR 93 | Temp 98.0°F | Resp 18 | Ht 62.0 in | Wt 163.0 lb

## 2012-11-15 DIAGNOSIS — Z09 Encounter for follow-up examination after completed treatment for conditions other than malignant neoplasm: Secondary | ICD-10-CM

## 2012-11-15 NOTE — Progress Notes (Signed)
Subjective:     Patient ID: Teresa [name], female   DOB: December 04, 1940, 72 y.o.   MRN: [id number]  HPI This is a 72 year old female who I know well after her an exploration of a long-term abdominal wound. I removed some mash. This has finally healed now. She is very happy with the result and she returns today without any complaints.  Review of Systems     Objective:   Physical Exam Abdomen is soft with large hernia, wound is all completely healed    Assessment:     Ventral hernia Healed wound     Plan:     I told her again that I think is reasonable not to repair her hernia given its size as well as what it would involve. She is very happy with the result after the wound is healed. I will plan on seeing her back as needed.

## 2012-11-22 ENCOUNTER — Telehealth (INDEPENDENT_AMBULATORY_CARE_PROVIDER_SITE_OTHER): Payer: Self-pay

## 2012-11-22 NOTE — Telephone Encounter (Signed)
Returned pt's call after speaking with Dr Donne Hazel. He advised the same thing the pt stated that what was going on with the seperation was coming from the thin skin also. The pt was advised to just keep a watch on the area if any changes to occur the pt needs to call us. The pt understands.

## 2012-11-22 NOTE — Telephone Encounter (Signed)
Pt calling in to notify Dr Donne Hazel that she did have some bleeding from the bottom of the wound over the weekend. The pt states that her skin is really thin so it seperated a little from the bottom. The pt did have some redness but that is all gone a way now. There is no odor,fever,or chills associated with the drainage. I will notify Dr Donne Hazel and get back in touch with her.

## 2012-11-26 ENCOUNTER — Encounter (INDEPENDENT_AMBULATORY_CARE_PROVIDER_SITE_OTHER): Payer: Self-pay | Admitting: General Surgery

## 2012-11-26 ENCOUNTER — Ambulatory Visit (INDEPENDENT_AMBULATORY_CARE_PROVIDER_SITE_OTHER): Payer: Medicare Other | Admitting: General Surgery

## 2012-11-26 VITALS — BP 136/80 | HR 90 | Temp 97.8°F | Resp 16 | Ht 63.5 in | Wt 167.0 lb

## 2012-11-26 DIAGNOSIS — L98499 Non-pressure chronic ulcer of skin of other sites with unspecified severity: Secondary | ICD-10-CM

## 2012-11-26 NOTE — Progress Notes (Signed)
Subjective:     Patient ID: Teresa [name], female   DOB: March 01, 1941, 72 y.o.   MRN: [id number]  HPI 50 yof well known to me from excision of chronic abdominal wounds.  They were healed last time I saw her but returns today with a small amount drainage from pinhole and some llq abdominal pain.  The abdominal pain is better today and improved after a bm.  The drainage has been very minimal. She has no fevers or other associated symptoms.  Review of Systems     Objective:   Physical Exam The wound on left side of scar has about 3 mm opening and some serosang drainage with a couple cm cavity present, abdomen otherwise nontender    Assessment:     Abdominal wound     Plan:     She is going to continue bowel regimen.  I put some packing in would to let this drain and will see her again early next week.  I think we can get this to heal.

## 2012-11-30 ENCOUNTER — Encounter (INDEPENDENT_AMBULATORY_CARE_PROVIDER_SITE_OTHER): Payer: Self-pay | Admitting: General Surgery

## 2012-11-30 ENCOUNTER — Ambulatory Visit (INDEPENDENT_AMBULATORY_CARE_PROVIDER_SITE_OTHER): Payer: Medicare Other | Admitting: General Surgery

## 2012-11-30 VITALS — BP 129/80 | HR 78 | Temp 97.4°F | Resp 16 | Ht 63.0 in | Wt 167.4 lb

## 2012-11-30 DIAGNOSIS — L98499 Non-pressure chronic ulcer of skin of other sites with unspecified severity: Secondary | ICD-10-CM

## 2012-11-30 DIAGNOSIS — L98491 Non-pressure chronic ulcer of skin of other sites limited to breakdown of skin: Secondary | ICD-10-CM

## 2012-12-01 NOTE — Progress Notes (Signed)
Subjective:     Patient ID: Teresa [name], female   DOB: May 02, 1941, 72 y.o.   MRN: [id number]  HPI 33 yof well known to me has recently undergone operative debridement and excision of mesh from chronic wounds. The worst of these has healed but left sided wound has opened up again.  I packed this last week and she returns today unchanged.  Review of Systems     Objective:   Physical Exam    all wounds healed except midline to left sided wound that tracks laterally.  I don't think there is any more foreign body here and may have just closed too quickly. No infection Assessment:     Anterior abd wall wound     Plan:     I opened this area some more and repacked today.  Hopefully will heal by secondary intention.  Will return Friday.

## 2012-12-03 ENCOUNTER — Encounter (INDEPENDENT_AMBULATORY_CARE_PROVIDER_SITE_OTHER): Payer: Self-pay | Admitting: General Surgery

## 2012-12-03 ENCOUNTER — Ambulatory Visit (INDEPENDENT_AMBULATORY_CARE_PROVIDER_SITE_OTHER): Payer: Medicare Other | Admitting: General Surgery

## 2012-12-03 VITALS — BP 128/68 | HR 78 | Resp 18 | Ht 63.0 in | Wt 166.0 lb

## 2012-12-03 DIAGNOSIS — L98491 Non-pressure chronic ulcer of skin of other sites limited to breakdown of skin: Secondary | ICD-10-CM

## 2012-12-03 DIAGNOSIS — L98499 Non-pressure chronic ulcer of skin of other sites with unspecified severity: Secondary | ICD-10-CM

## 2012-12-03 NOTE — Progress Notes (Signed)
Subjective:     Patient ID: Teresa [name], female   DOB: [birth year], 72 y.o.   MRN: [id number]  HPI 72 yof well known to me has recently undergone operative debridement and excision of mesh from chronic wounds. The worst of these has healed but left sided wound had opened up again. I packed this last week and she returns today unchanged.It is draining some murky material. No fevers   Review of Systems     Objective:   Physical Exam Open wound left abdominal wall with cavity lateral, easily packed, granulation tissue present, no real infection today    Assessment:     Ab wall wound     Plan:     I think this will heal eventually.  There was no mesh really on that side that I saw.  I think it closed up too early. May need to open up more at some point but will try to get it to heal conservatively for now.  I am out next week so will try to have her see Dr Migdalia Dk next week and then I will follow up in 2 weeks.  I assured her we would get this side to heal also

## 2012-12-09 ENCOUNTER — Other Ambulatory Visit: Payer: Self-pay | Admitting: Cardiology

## 2012-12-10 ENCOUNTER — Other Ambulatory Visit: Payer: Self-pay | Admitting: *Deleted

## 2012-12-10 MED ORDER — SPIRONOLACTONE 25 MG PO TABS: 12.5000 mg | ORAL_TABLET | Freq: Every day | ORAL | Status: DC

## 2012-12-10 NOTE — Telephone Encounter (Signed)
Patient calling to have spironolactone refilled. I informed patient I will be able to refill for 30 days but she must schedule an appointment for further refills. She verbally agreed and said she will call back a little later to schedule that appointment.

## 2012-12-14 ENCOUNTER — Encounter (INDEPENDENT_AMBULATORY_CARE_PROVIDER_SITE_OTHER): Payer: Self-pay | Admitting: General Surgery

## 2012-12-14 ENCOUNTER — Other Ambulatory Visit: Payer: Self-pay

## 2012-12-14 ENCOUNTER — Ambulatory Visit (INDEPENDENT_AMBULATORY_CARE_PROVIDER_SITE_OTHER): Payer: Medicare Other | Admitting: General Surgery

## 2012-12-14 VITALS — BP 114/60 | HR 100 | Resp 16 | Ht 63.0 in | Wt 167.2 lb

## 2012-12-14 DIAGNOSIS — Z09 Encounter for follow-up examination after completed treatment for conditions other than malignant neoplasm: Secondary | ICD-10-CM

## 2012-12-14 DIAGNOSIS — L98499 Non-pressure chronic ulcer of skin of other sites with unspecified severity: Secondary | ICD-10-CM

## 2012-12-14 DIAGNOSIS — L98491 Non-pressure chronic ulcer of skin of other sites limited to breakdown of skin: Secondary | ICD-10-CM

## 2012-12-14 MED ORDER — SPIRONOLACTONE 25 MG PO TABS: 12.5000 mg | ORAL_TABLET | Freq: Every day | ORAL | Status: DC

## 2012-12-14 NOTE — Progress Notes (Signed)
Subjective:     Patient ID: Teresa [name], female   DOB: [birth year], 72 y.o.   MRN: [id number]  HPI 57 yof well known to me has recently undergone operative debridement and excision of mesh from chronic wounds. The worst of these has healed but left sided wound had opened up again. This appears to be slowly getting better. The amount that is getting packed is decreased. She has no pain at this site and has no fevers.   Review of Systems     Objective:   Physical Exam She is about an 8 x 8 mm shallow granulating ulcer. I cannot really prove this to connect anywhere else today. There is no fluid collection or any evidence of infection.    Assessment:     Healing abdominal wall ulceration     Plan:     I think this will slowly heal at this point. There is certainly a chance it may open back up and we will need to go back to the operating room and debride this. We will just monitor this for now and she will continue to pack this. I will plan on seeing her back in several weeks if she is due to see plastic surgery next week.

## 2012-12-22 ENCOUNTER — Other Ambulatory Visit: Payer: Self-pay | Admitting: *Deleted

## 2012-12-22 MED ORDER — CARVEDILOL 12.5 MG PO TABS: 12.5000 mg | ORAL_TABLET | Freq: Two times a day (BID) | ORAL | Status: DC

## 2012-12-31 ENCOUNTER — Encounter: Payer: Self-pay | Admitting: Cardiology

## 2013-01-04 DIAGNOSIS — L98499 Non-pressure chronic ulcer of skin of other sites with unspecified severity: Secondary | ICD-10-CM | POA: Insufficient documentation

## 2013-01-11 ENCOUNTER — Encounter: Payer: Self-pay | Admitting: Nurse Practitioner

## 2013-01-11 ENCOUNTER — Ambulatory Visit (INDEPENDENT_AMBULATORY_CARE_PROVIDER_SITE_OTHER): Payer: Medicare Other | Admitting: Nurse Practitioner

## 2013-01-11 VITALS — BP 118/74 | HR 60 | Ht 63.0 in | Wt 172.8 lb

## 2013-01-11 DIAGNOSIS — I428 Other cardiomyopathies: Secondary | ICD-10-CM

## 2013-01-11 MED ORDER — CARVEDILOL 12.5 MG PO TABS: 12.5000 mg | ORAL_TABLET | Freq: Two times a day (BID) | ORAL | Status: DC

## 2013-01-11 MED ORDER — SPIRONOLACTONE 25 MG PO TABS: 12.5000 mg | ORAL_TABLET | Freq: Every day | ORAL | Status: DC

## 2013-01-11 NOTE — Progress Notes (Signed)
Teresa [name] Date of Birth: January 13, 1941 Medical Record [id number]  History of Present Illness: Teresa [name] is seen back today for a follow up visit. Seen for Dr. Percival Spanish - former patient of Dr. Susa Simmonds. Has a nonischemic CM. Past EF down to 25%. EF improved with medicines. Plans for an ICD in the past were cancelled. Other issues include tobacco abuse, HTN, PVCs, DM, valvular heart disease, spinal stenosis and glaucoma. Had a small bowel obstruction back in November of 2012 and had surgery with delayed wound healing.   Seen over a year ago. Was complaining of dyspnea. Echo was updated. EF was normal. She had stopped smoking when I last saw her.   Comes in today. Here alone. Doing ok. Still having issues with her abdominal wound not healing. Weight is way up. She is not smoking. 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 No chest pain. Breathing is ok. No swelling. Loves diet [employer]. Needs her medicines refilled.    Current Outpatient Prescriptions  Medication Sig Dispense Refill  . ALPRAZolam (XANAX) 0.25 MG tablet Take 1 tablet (0.25 mg total) by mouth 3 (three) times daily as needed for anxiety.  90 tablet  5  . carvedilol (COREG) 12.5 MG tablet Take 1 tablet (12.5 mg total) by mouth 2 (two) times daily with a meal.  60 tablet  11  . famotidine (PEPCID) 20 MG tablet Take 20-40 mg by mouth 2 (two) times daily as needed for heartburn.      . furosemide (LASIX) 40 MG tablet Take 40 mg by mouth daily.       Marland Kitchen HYDROcodone-acetaminophen (NORCO/VICODIN) 5-325 MG per tablet Take 1 tablet by mouth every 6 (six) hours as needed.  30 tablet  0  . levothyroxine (SYNTHROID, LEVOTHROID) 88 MCG tablet Take 1 tablet (88 mcg total) by mouth daily.  30 tablet  5  . losartan (COZAAR) 25 MG tablet TAKE 1 TABLET DAILY.  30 tablet  3  . spironolactone (ALDACTONE) 25 MG tablet Take 0.5 tablets (12.5 mg total) by mouth daily.  30 tablet  11   No current facility-administered medications for this  visit.    Allergies  Allergen Reactions  . Aspirin Other (See Comments)    HX Bleeding Ulcer   . Nsaids     Hx of bleeding ulcers  . Tolmetin Rash    Hx of bleeding ulcers    Past Medical History  Diagnosis Date  . Nonischemic cardiomyopathy DR [facility]     EF is 25% per echo February 2012, non ischemic myoview 12/2009.  EF 50% echo 7/12 and plans for ICD cancelled.   Marland Kitchen HTN (hypertension)   . PVC (premature ventricular contraction)   . Valvular heart disease     Moderate to severe MR, moderate TR, LAE per echo 7/11  . Spinal stenosis   . Abdominal pain   . History of glaucoma   . Non-healing surgical wound     ABDOMINAL S/P EXPLORATOY LAPAROTOMY 04-18-2011  . History of small bowel obstruction 2009 AND NOV 2012  . Peptic ulcer disease   . GERD (gastroesophageal reflux disease)   . Chronic systolic CHF (congestive heart failure), NYHA class 2     EF 25% 2011 MV,  50-55% echo 6/13  . Moderate mitral regurgitation   . Moderate tricuspid regurgitation   . Arthritis     FEET, KNEES, HANDS  . H/O hiatal hernia   . Urge urinary incontinence   . Left ovarian cyst   .  Diabetes mellitus type 2, diet-controlled   . Shortness of breath   . Wears dentures     upper denture-lower partial  . Hypothyroidism     Past Surgical History  Procedure Laterality Date  . Tubal ligation    . Glaucoma surgery  1978  . Exploratomy lap. / extensive lysis adhesions/ small bowel resection x2 with primary anastomosis x2  04-18-2011    SMALL BOWEL OBSTRUCTION  . Knee arthroscopy w/ meniscectomy  06-05-2004  . Transthoracic echocardiogram  12-27-2010    EF 45-50%/ MODERATE MV REGURG. / LEFT ATRIUM MILDLY DILATED/ MILD TRICUSPID REGURG.  . Cardiovascular stress test  JULY 2011-  DR HOCHREIN    NO ISCHEMIA  . Thyroidectomy  1978 GOITOR    AND CERVICAL COLD KNIFE CONE BX  . Tonsillectomy  AGE 72  . Benign right breast tumor removed    . Appendectomy  1979    RUPTURED  . Abdominal hernia  repair  90 YRS AGO    PERITINITIS  . Ventral hernia repair  X4  LAST ONE 20 YRS AGO    ABDOMINAL --  EACH ONE IN DIFFERENT AREA  . Wound debridement  09/25/2011    Procedure: DEBRIDEMENT CLOSURE/ABDOMINAL WOUND;  Surgeon: Theodoro Kos, DO;  Location: [place];  Service: Plastics;  Laterality: N/A;  excision of abdominal wound with primary closure  . Ventral hernia repair  01/29/2012    Procedure: HERNIA REPAIR VENTRAL ADULT;  Surgeon: Theodoro Kos, DO;  Location: [place];  Service: Plastics;  Laterality: N/A;  . Hernia repair    . Incision and drainage of wound N/A 09/13/2012    Procedure: INCISION  AND DEBRIDEMENT WOUND abdominal wall ;  Surgeon: Rolm Bookbinder, MD;  Location: [place];  Service: General;  Laterality: N/A;  coordination with Dr Migdalia Dk     History  Smoking status  . Former Smoker -- 7 years  . Types: Cigarettes  . Quit date: 09/22/2010  Smokeless tobacco  . Never Used    Comment: quit 2011    History  Alcohol Use No    Family History  Problem Relation Age of Onset  . Stroke Brother   . Stroke Sister   . Heart disease Sister     Review of Systems: The review of systems is per the HPI.  All other systems were reviewed and are negative.  Physical Exam: BP 118/74  Pulse 60  Ht 5\' 3"  (1.6 m)  Wt 172 lb 12.8 oz (78.382 kg)  BMI 30.62 kg/m2 Patient is very pleasant and in no acute distress. Skin is warm and dry. Color is normal.  HEENT is unremarkable. Normocephalic/atraumatic. PERRL. Sclera are nonicteric. Neck is supple. No masses. No JVD. Lungs are clear. Cardiac exam shows a regular rate and rhythm. Abdomen is soft. Extremities are without edema. Gait and ROM are intact. No gross neurologic deficits noted.  LABORATORY DATA: Lab Results  Component Value Date   WBC 10.2 09/14/2012   HGB 12.3 09/14/2012   HCT 36.4 09/14/2012   PLT 200 09/14/2012   GLUCOSE 105* 09/14/2012   CHOL 171 09/01/2012   TRIG 196* 09/01/2012   HDL 28*  09/01/2012   LDLDIRECT 182.5 09/12/2010   LDLCALC 104* 09/01/2012   ALT 16 09/01/2012   AST 21 09/01/2012   NA 138 09/14/2012   K 3.8 09/14/2012   CL 100 09/14/2012   CREATININE 0.91 09/14/2012   BUN 15 09/14/2012   CO2 25 09/14/2012   TSH 0.441 09/01/2012  INR 1.05 05/27/2011   HGBA1C 5.6 09/01/2012   Echo Study Conclusions from June 2013  - Left ventricle: The cavity size was normal. Wall thickness was increased in a pattern of mild LVH. Systolic function was normal. The estimated ejection fraction was in the range of 50% to 55%. Wall motion was normal; there were no regional wall motion abnormalities. Doppler parameters are consistent with abnormal left ventricular relaxation (grade 1 diastolic dysfunction). - Aortic valve: Mild regurgitation. - Mitral valve: Mild regurgitation. - Left atrium: The atrium was mildly dilated. - Pulmonary arteries: Systolic pressure was mildly increased. PA peak pressure: 109mm Hg (S).   Assessment / Plan: 1. Nonischemic CM - EF per echo last year at 50 to 55%. She is felt to be doing well. I have refilled her medicines today. See her back in a year.   2. Tobacco abuse - not smoking  3. Obesity - needs to stop the diet drinks. Encouraged activity.   I will see her back in a year.   Patient is agreeable to this plan and will call if any problems develop in the interim.   Burtis Junes, RN, ANP-C [facility] [address] [place], [place]  [address]

## 2013-01-11 NOTE — Patient Instructions (Addendum)
Stay on your current medicines  I have refilled your medicines today  I will see you in a year  Call the [place] office at [phone] if you have any questions, problems or concerns.

## 2013-01-14 ENCOUNTER — Encounter (INDEPENDENT_AMBULATORY_CARE_PROVIDER_SITE_OTHER): Payer: Self-pay | Admitting: General Surgery

## 2013-01-14 ENCOUNTER — Ambulatory Visit (INDEPENDENT_AMBULATORY_CARE_PROVIDER_SITE_OTHER): Payer: Medicare Other | Admitting: General Surgery

## 2013-01-14 VITALS — BP 144/82 | HR 76 | Temp 98.2°F | Resp 16 | Ht 63.0 in | Wt 171.6 lb

## 2013-01-14 DIAGNOSIS — L98491 Non-pressure chronic ulcer of skin of other sites limited to breakdown of skin: Secondary | ICD-10-CM

## 2013-01-14 DIAGNOSIS — L98499 Non-pressure chronic ulcer of skin of other sites with unspecified severity: Secondary | ICD-10-CM

## 2013-01-16 NOTE — Progress Notes (Signed)
Subjective:     Patient ID: Teresa [name], female   DOB: [birth year], 72 y.o.   MRN: [id number]  HPI  72 yof who has long history of open abdominal wound after surgery by one of my partners.  I have returned her to the or and debrided some old mesh from rlq. This wound has healed.  The left sided wound healed and then opened back up again.  We have been trying to get this to heal conservatively.  It is draining very little now, no pain, no redness.  She is otherwise well and without complaints. She is just keeping band-aid on it now. Review of Systems     Objective:   Physical Exam Healed midline wound, ventral hernia, right side healed, left side with 2 mm opening that tracks laterally but really not draining anything, no infection    Assessment:     Open abdominal wound    Plan:    we will continue to try to heal conservatively.  It looks like about to close up.  If this is not successful may need to return to open this cavity. Will have come back in four weeks or sooner if needed

## 2013-01-17 ENCOUNTER — Other Ambulatory Visit: Payer: Self-pay

## 2013-01-17 MED ORDER — FUROSEMIDE 40 MG PO TABS: 40.0000 mg | ORAL_TABLET | Freq: Every day | ORAL | Status: DC

## 2013-02-04 ENCOUNTER — Inpatient Hospital Stay (HOSPITAL_COMMUNITY)
Admission: EM | Admit: 2013-02-04 | Discharge: 2013-02-07 | DRG: 373 | Disposition: A | Discharge status: Home Health | Payer: Medicare Other | Attending: General Surgery | Admitting: General Surgery

## 2013-02-04 ENCOUNTER — Ambulatory Visit (HOSPITAL_COMMUNITY)
Admission: RE | Admit: 2013-02-04 | Discharge: 2013-02-04 | Disposition: A | Discharge status: Home / Self Care | Payer: Medicare Other | Source: Ambulatory Visit | Attending: Family Medicine | Admitting: Family Medicine

## 2013-02-04 ENCOUNTER — Ambulatory Visit (HOSPITAL_COMMUNITY): Payer: Medicare Other

## 2013-02-04 ENCOUNTER — Ambulatory Visit: Payer: Medicare Other

## 2013-02-04 ENCOUNTER — Ambulatory Visit (INDEPENDENT_AMBULATORY_CARE_PROVIDER_SITE_OTHER): Payer: Medicare Other | Admitting: Family Medicine

## 2013-02-04 ENCOUNTER — Encounter (HOSPITAL_COMMUNITY): Payer: Self-pay

## 2013-02-04 VITALS — BP 126/72 | HR 86 | Temp 97.8°F | Resp 18 | Ht 62.0 in | Wt 167.0 lb

## 2013-02-04 DIAGNOSIS — R509 Fever, unspecified: Secondary | ICD-10-CM

## 2013-02-04 DIAGNOSIS — E119 Type 2 diabetes mellitus without complications: Secondary | ICD-10-CM | POA: Diagnosis present

## 2013-02-04 DIAGNOSIS — K651 Peritoneal abscess: Principal | ICD-10-CM | POA: Diagnosis present

## 2013-02-04 DIAGNOSIS — Z87891 Personal history of nicotine dependence: Secondary | ICD-10-CM

## 2013-02-04 DIAGNOSIS — D72829 Elevated white blood cell count, unspecified: Secondary | ICD-10-CM

## 2013-02-04 DIAGNOSIS — Z9049 Acquired absence of other specified parts of digestive tract: Secondary | ICD-10-CM

## 2013-02-04 DIAGNOSIS — M549 Dorsalgia, unspecified: Secondary | ICD-10-CM

## 2013-02-04 DIAGNOSIS — R5381 Other malaise: Secondary | ICD-10-CM | POA: Diagnosis present

## 2013-02-04 LAB — COMPREHENSIVE METABOLIC PANEL
ALT: 10 U/L (ref 0–35)
AST: 10 U/L (ref 0–37)
Alkaline Phosphatase: 118 U/L — ABNORMAL HIGH (ref 39–117)
CO2: 23 mEq/L (ref 19–32)
Chloride: 98 mEq/L (ref 96–112)
GFR calc Af Amer: 72 mL/min — ABNORMAL LOW (ref >90)
GFR calc non Af Amer: 62 mL/min — ABNORMAL LOW (ref >90)
Glucose, Bld: 120 mg/dL — ABNORMAL HIGH (ref 70–99)
Sodium: 132 mEq/L — ABNORMAL LOW (ref 135–145)
Total Bilirubin: 0.3 mg/dL (ref 0.3–1.2)

## 2013-02-04 LAB — POCT URINALYSIS DIPSTICK
Bilirubin, UA: NEGATIVE
Blood, UA: NEGATIVE
Ketones, UA: NEGATIVE
Spec Grav, UA: 1.015
pH, UA: 5

## 2013-02-04 LAB — POCT CBC
Granulocyte percent: 80.3 %G — AB (ref 37–80)
MID (cbc): 0.8 (ref 0–0.9)
POC Granulocyte: 13.3 — AB (ref 2–6.9)
POC LYMPH PERCENT: 14.8 %L (ref 10–50)
Platelet Count, POC: 377 10*3/uL (ref 142–424)
RDW, POC: 13.5 %

## 2013-02-04 LAB — POCT UA - MICROSCOPIC ONLY: Yeast, UA: NEGATIVE

## 2013-02-04 LAB — PROTIME-INR: INR: 1.08 (ref 0.00–1.49)

## 2013-02-04 LAB — CREATININE, SERUM: GFR calc non Af Amer: 61 mL/min — ABNORMAL LOW (ref >90)

## 2013-02-04 MED ORDER — CARVEDILOL 12.5 MG PO TABS
12.5000 mg | ORAL_TABLET | Freq: Two times a day (BID) | ORAL | Status: DC
  Administered 2013-02-05 – 2013-02-07 (×4): 12.5 mg via ORAL
  Filled 2013-02-04 (×7): qty 1

## 2013-02-04 MED ORDER — FUROSEMIDE 40 MG PO TABS
40.0000 mg | ORAL_TABLET | Freq: Every day | ORAL | Status: DC
  Administered 2013-02-06 – 2013-02-07 (×2): 40 mg via ORAL
  Filled 2013-02-04 (×3): qty 1

## 2013-02-04 MED ORDER — MORPHINE SULFATE 2 MG/ML IJ SOLN
1.0000 mg | INTRAMUSCULAR | Status: DC | PRN
  Administered 2013-02-04 – 2013-02-05 (×2): 2 mg via INTRAVENOUS
  Filled 2013-02-04 (×2): qty 1

## 2013-02-04 MED ORDER — LEVOTHYROXINE SODIUM 88 MCG PO TABS
88.0000 ug | ORAL_TABLET | Freq: Every day | ORAL | Status: DC
Start: 2013-02-05 — End: 2013-02-07
  Administered 2013-02-05 – 2013-02-07 (×3): 88 ug via ORAL
  Filled 2013-02-04 (×4): qty 1

## 2013-02-04 MED ORDER — KCL IN DEXTROSE-NACL 20-5-0.9 MEQ/L-%-% IV SOLN
INTRAVENOUS | Status: DC
  Administered 2013-02-04 – 2013-02-06 (×4): via INTRAVENOUS
  Filled 2013-02-04 (×6): qty 1000

## 2013-02-04 MED ORDER — ZOLPIDEM TARTRATE 5 MG PO TABS
5.0000 mg | ORAL_TABLET | Freq: Every evening | ORAL | Status: DC | PRN
  Administered 2013-02-04 – 2013-02-05 (×2): 5 mg via ORAL
  Filled 2013-02-04 (×2): qty 1

## 2013-02-04 MED ORDER — SPIRONOLACTONE 12.5 MG HALF TABLET
12.5000 mg | ORAL_TABLET | Freq: Every day | ORAL | Status: DC
  Administered 2013-02-05 – 2013-02-07 (×3): 12.5 mg via ORAL
  Filled 2013-02-04 (×3): qty 1

## 2013-02-04 MED ORDER — ONDANSETRON HCL 4 MG/2ML IJ SOLN: 4.0000 mg | Freq: Four times a day (QID) | INTRAMUSCULAR | Status: DC | PRN

## 2013-02-04 MED ORDER — IOHEXOL 300 MG/ML  SOLN
100.0000 mL | Freq: Once | INTRAMUSCULAR | Status: AC | PRN
  Administered 2013-02-04: 100 mL via INTRAVENOUS

## 2013-02-04 MED ORDER — HYDROCODONE-ACETAMINOPHEN 5-325 MG PO TABS
1.0000 | ORAL_TABLET | ORAL | Status: DC | PRN
  Administered 2013-02-05: 2 via ORAL
  Administered 2013-02-05: 1 via ORAL
  Administered 2013-02-05 – 2013-02-07 (×5): 2 via ORAL
  Filled 2013-02-04 (×6): qty 2

## 2013-02-04 MED ORDER — ALPRAZOLAM 0.25 MG PO TABS
0.2500 mg | ORAL_TABLET | Freq: Three times a day (TID) | ORAL | Status: DC | PRN
  Administered 2013-02-06 – 2013-02-07 (×2): 0.25 mg via ORAL
  Filled 2013-02-04 (×2): qty 1

## 2013-02-04 MED ORDER — PIPERACILLIN-TAZOBACTAM 3.375 G IVPB
3.3750 g | Freq: Three times a day (TID) | INTRAVENOUS | Status: DC
  Administered 2013-02-04 – 2013-02-07 (×8): 3.375 g via INTRAVENOUS
  Filled 2013-02-04 (×10): qty 50

## 2013-02-04 MED ORDER — PANTOPRAZOLE SODIUM 40 MG IV SOLR
40.0000 mg | Freq: Every day | INTRAVENOUS | Status: DC
  Administered 2013-02-04 – 2013-02-06 (×3): 40 mg via INTRAVENOUS
  Filled 2013-02-04 (×4): qty 40

## 2013-02-04 MED ORDER — LOSARTAN POTASSIUM 25 MG PO TABS
25.0000 mg | ORAL_TABLET | Freq: Every day | ORAL | Status: DC
  Administered 2013-02-05 – 2013-02-07 (×3): 25 mg via ORAL
  Filled 2013-02-04 (×3): qty 1

## 2013-02-04 NOTE — ED Notes (Signed)
Pt sent over from urgent care with concerns from CT scan. Pt roomed waiting for surgeon to visit. Pt does not complain of any pain at this time. VS stable. Family at bedside.

## 2013-02-04 NOTE — ED Provider Notes (Signed)
CSN: [id number]     Arrival date & time 02/04/13  1921 History     First MD Initiated Contact with Patient 02/04/13 1929     Chief Complaint  Patient presents with  . Abscess   (Consider location/radiation/quality/duration/timing/severity/associated sxs/prior Treatment) Patient is a 72 y.o. female presenting with abdominal pain. The history is provided by the patient. No language interpreter was used.  Abdominal Pain Pain location:  RUQ Pain quality: aching   Pain radiates to:  Does not radiate Pain severity:  Moderate Onset quality:  Gradual Timing:  Constant Progression:  Worsening Chronicity:  New Context: previous surgery (multiple with hx of poor wound healing, abscess formation)   Context: not sick contacts and not suspicious food intake   Relieved by:  Nothing Worsened by:  Movement and palpation Ineffective treatments:  None tried Associated symptoms: fatigue and nausea   Associated symptoms: no anorexia, no chest pain, no chills, no cough, no diarrhea, no dysuria, no fever (low temperature, per pt), no shortness of breath, no sore throat and no vomiting   Risk factors: being elderly, multiple surgeries and obesity     Past Medical History  Diagnosis Date  . Nonischemic cardiomyopathy DR [facility]     EF is 25% per echo February 2012, non ischemic myoview 12/2009.  EF 50% echo 7/12 and plans for ICD cancelled.   Marland Kitchen HTN (hypertension)   . PVC (premature ventricular contraction)   . Valvular heart disease     Moderate to severe MR, moderate TR, LAE per echo 7/11  . Spinal stenosis   . Abdominal pain   . History of glaucoma   . Non-healing surgical wound     ABDOMINAL S/P EXPLORATOY LAPAROTOMY 04-18-2011  . History of small bowel obstruction 2009 AND NOV 2012  . Peptic ulcer disease   . GERD (gastroesophageal reflux disease)   . Chronic systolic CHF (congestive heart failure), NYHA class 2     EF 25% 2011 MV,  50-55% echo 6/13  . Moderate mitral regurgitation   .  Moderate tricuspid regurgitation   . Arthritis     FEET, KNEES, HANDS  . H/O hiatal hernia   . Urge urinary incontinence   . Left ovarian cyst   . Diabetes mellitus type 2, diet-controlled   . Shortness of breath   . Wears dentures     upper denture-lower partial  . Hypothyroidism    Past Surgical History  Procedure Laterality Date  . Tubal ligation    . Glaucoma surgery  1978  . Exploratomy lap. / extensive lysis adhesions/ small bowel resection x2 with primary anastomosis x2  04-18-2011    SMALL BOWEL OBSTRUCTION  . Knee arthroscopy w/ meniscectomy  06-05-2004  . Transthoracic echocardiogram  12-27-2010    EF 45-50%/ MODERATE MV REGURG. / LEFT ATRIUM MILDLY DILATED/ MILD TRICUSPID REGURG.  . Cardiovascular stress test  JULY 2011-  DR HOCHREIN    NO ISCHEMIA  . Thyroidectomy  1978 GOITOR    AND CERVICAL COLD KNIFE CONE BX  . Tonsillectomy  AGE 61  . Benign right breast tumor removed    . Appendectomy  1979    RUPTURED  . Abdominal hernia repair  26 YRS AGO    PERITINITIS  . Ventral hernia repair  X4  LAST ONE 20 YRS AGO    ABDOMINAL --  EACH ONE IN DIFFERENT AREA  . Wound debridement  09/25/2011    Procedure: DEBRIDEMENT CLOSURE/ABDOMINAL WOUND;  Surgeon: Theodoro Kos, DO;  Location: [facility];  Service: Plastics;  Laterality: N/A;  excision of abdominal wound with primary closure  . Ventral hernia repair  01/29/2012    Procedure: HERNIA REPAIR VENTRAL ADULT;  Surgeon: Theodoro Kos, DO;  Location: [facility];  Service: Plastics;  Laterality: N/A;  . Hernia repair    . Incision and drainage of wound N/A 09/13/2012    Procedure: INCISION  AND DEBRIDEMENT WOUND abdominal wall ;  Surgeon: Rolm Bookbinder, MD;  Location: [facility];  Service: General;  Laterality: N/A;  coordination with Dr Migdalia Dk    Family History  Problem Relation Age of Onset  . Stroke Brother   . Stroke Sister   . Heart disease Sister    History  Substance Use Topics  .  Smoking status: Former Smoker -- 30 years    Types: Cigarettes    Quit date: 09/22/2010  . Smokeless tobacco: Never Used     Comment: quit 2011  . Alcohol Use: No   OB History   Grav Para Term Preterm Abortions TAB SAB Ect Mult Living                 Review of Systems  Constitutional: Positive for fatigue. Negative for fever (low temperature, per pt), chills, diaphoresis, activity change and appetite change.  HENT: Negative for congestion, sore throat, facial swelling, rhinorrhea, neck pain and neck stiffness.   Eyes: Negative for photophobia and discharge.  Respiratory: Negative for cough, chest tightness and shortness of breath.   Cardiovascular: Negative for chest pain, palpitations and leg swelling.  Gastrointestinal: Positive for nausea and abdominal pain. Negative for vomiting, diarrhea and anorexia.  Endocrine: Negative for polydipsia and polyuria.  Genitourinary: Negative for dysuria, frequency, difficulty urinating and pelvic pain.  Musculoskeletal: Negative for back pain and arthralgias.  Skin: Negative for color change and wound.  Allergic/Immunologic: Negative for immunocompromised state.  Neurological: Negative for facial asymmetry, weakness, numbness and headaches.  Hematological: Does not bruise/bleed easily.  Psychiatric/Behavioral: Negative for confusion and agitation.    Allergies  Aspirin; Nsaids; and Tolmetin  Home Medications   Current Outpatient Rx  Name  Route  Sig  Dispense  Refill  . ALPRAZolam (XANAX) 0.25 MG tablet   Oral   Take 1 tablet (0.25 mg total) by mouth 3 (three) times daily as needed for anxiety.   90 tablet   5   . carvedilol (COREG) 12.5 MG tablet   Oral   Take 1 tablet (12.5 mg total) by mouth 2 (two) times daily with a meal.   60 tablet   11   . famotidine (PEPCID) 20 MG tablet   Oral   Take 20-40 mg by mouth 2 (two) times daily as needed for heartburn.         . furosemide (LASIX) 40 MG tablet   Oral   Take 1 tablet (40  mg total) by mouth daily.   30 tablet   6   . HYDROcodone-acetaminophen (NORCO/VICODIN) 5-325 MG per tablet   Oral   Take 1 tablet by mouth every 6 (six) hours as needed.   30 tablet   0   . levothyroxine (SYNTHROID, LEVOTHROID) 88 MCG tablet   Oral   Take 1 tablet (88 mcg total) by mouth daily.   30 tablet   5   . losartan (COZAAR) 25 MG tablet      TAKE 1 TABLET DAILY.   30 tablet   3   . spironolactone (ALDACTONE) 25 MG tablet   Oral  Take 0.5 tablets (12.5 mg total) by mouth daily.   30 tablet   11     Patient must schedule appointment for further refi ...    BP 116/61  Pulse 75  Temp(Src) 97.6 F (36.4 C) (Oral)  Resp 16  SpO2 99% Physical Exam  Constitutional: She is oriented to person, place, and time. She appears well-developed and well-nourished. No distress.  HENT:  Head: Normocephalic and atraumatic.  Mouth/Throat: No oropharyngeal exudate.  Eyes: Pupils are equal, round, and reactive to light.  Neck: Normal range of motion. Neck supple.  Cardiovascular: Normal rate and regular rhythm.  Exam reveals no gallop and no friction rub.   Murmur heard. Pulmonary/Chest: Effort normal and breath sounds normal. No respiratory distress. She has no wheezes. She has no rales.  Abdominal: Soft. Bowel sounds are normal. She exhibits no distension and no mass. There is tenderness in the right upper quadrant. There is no rigidity, no rebound and no guarding.  Musculoskeletal: Normal range of motion. She exhibits no edema and no tenderness.  Neurological: She is alert and oriented to person, place, and time.  Skin: Skin is warm and dry.  Psychiatric: She has a normal mood and affect.    ED Course   Procedures (including critical care time)  Labs Reviewed - No data to display Dg Chest 2 View  02/04/2013   *RADIOLOGY REPORT*  Clinical Data: Fever and right flank pain.  CHEST - 2 VIEW  Comparison:  09/25/2011  Findings:  The heart size and mediastinal contours are  within normal limits. Both lungs are essentially clear.  There are a few densities along the medial right lung base but suspect this is related to overlying shadows based on the previous study.  Trachea is midline.  Bony thorax is intact.  IMPRESSION: No clear evidence for acute disease.  Few densities at the medial right lung base as described.  If there is high clinical concern for infection, consider treatment and followup imaging.   Original Report Authenticated By: Markus Daft, M.D.   Ct Abdomen Pelvis W Contrast  02/04/2013   CLINICAL DATA:  Mid abdominal pain for the past 2-3 weeks. Leukocytosis. Open wound from abdominal surgery 2 years ago.  EXAM: CT ABDOMEN AND PELVIS WITH CONTRAST  TECHNIQUE: Multidetector CT imaging of the abdomen and pelvis was performed using the standard protocol following bolus administration of intravenous contrast.  CONTRAST:  120mL OMNIPAQUE IOHEXOL 300 MG/ML  SOLN  COMPARISON:  07/21/2012.  FINDINGS: Interval increase in the size of the previously seen small fluid collection lateral to the right lobe of the liver inferiorly. On image number 22, this currently measures 7.7 x 3.2 cm in maximum dimensions and previously measured 4.3 x 1.0 cm in corresponding dimensions. A mildly thickened rim of surrounding enhancing tissue is noted.  No new fluid collections are seen. A large midline anterior abdominal wall hernia is again demonstrated at the level of the inferior pelvis, containing herniated small bowel without obstruction. A single dilated anastomotic bowel loop is again demonstrated in the left lower abdomen. Multiple sigmoid colon diverticula are again demonstrated without evidence of diverticulitis.  Unremarkable liver, spleen, pancreas, gallbladder, adrenal glands, kidneys, urinary bladder, uterus and ovaries. The appendix is surgically absent. Clear lung bases. Lumbar lower thoracic spine degenerative changes. Atheromatous arterial calcifications.  IMPRESSION: 1. 7.7 x 3.2  cm enlarging abscess lateral to the inferior portion of the right lobe of the liver.  2.  Large ventral hernia containing small bowel without  obstruction.  3.  Sigmoid diverticulosis.   Electronically Signed   By: Enrique Sack   On: 02/04/2013 18:06   Dg Abd Acute W/chest  02/04/2013   *RADIOLOGY REPORT*  Clinical Data: Right flank pain.  Fever.  ACUTE ABDOMEN SERIES (ABDOMEN 2 VIEW & CHEST 1 VIEW)  Comparison: CT abdomen and pelvis 07/21/2012 [facility], 02/06/2012 [facility], and 05/27/2011 [facility].  Findings: Bowel gas pattern unremarkable without evidence of obstruction or significant ileus.  On the erect image, there is suggestion of a large soft tissue mass in the mid upper abdomen, with mass effect upon the lesser curvature of the J-shaped stomach. No free intraperitoneal air.  No visible opaque urinary tract calculi.  Degenerative changes involving the lumbar spine.  IMPRESSION: Possible mass in the mid upper abdomen.  No acute abdominal abnormality otherwise.  Enhanced CT abdomen and pelvis may be helpful in further evaluation to confirm or deny this finding.  Clinically significant discrepancy from primary report, if provided: None provided.  These results will be called to the ordering clinician or representative by the Radiologist Assistant, and communication documented in the PACS Dashboard.   Original Report Authenticated By: Evangeline Dakin, M.D.   1. Intra-abdominal abscess     MDM   Pt is a 72 y.o. female with Pmhx as above who presents with 7.7 x 3.2 cm intraabdominal abscess in RUQ from outpt CT ordered by PCP today.  Pt had been having RUQ pain, low temps, fatigue. She has a complicated surgical hx and is well known to surgery dept.  Dr. Excell Seltzer with gen surg in dept at time or pt arrival, plan to admit to his service for IR drain in the morning. VSS, pt in NAD.  +ttp in RUQ on PE.  CMP, Pt/PTT added per surgery request.   1. Intra-abdominal abscess        Neta Ehlers, MD 02/05/13 1031

## 2013-02-04 NOTE — Progress Notes (Signed)
[facility] [address], [place] [address] [phone]  Date:  02/04/2013   Name:  Teresa [name]   DOB:  1940-07-08   MRN:  [id number]  PCP:  [name], MD    Chief Complaint: Shortness of Breath, Fatigue, Dizziness, Chest Pain and Fever   History of Present Illness:  Teresa [name] is a 72 y.o. very pleasant female patient who presents with the following:  She is here today with illness. She went to see Dr. Migdalia Dk today for a recheck of her surgical wound and was noted to be ill, so Dr. Migdalia Dk asked her to come in for evaluation.   She notes she is feeling very tired, is running a fever off an on, fatigue.  Her sx seemed to come on slowly.  Over the last couple of weeks she has noted increasing SOB, feeling tired with walking. No cough.  Her temp has run up to 100.5.  She has had sweats and chills.    She notes a pain in her right side for 2 or 3 days.   She had bowel surgery 2 years ago, and never seemed to "fully recover her strength."  Poor appeite, but no nausea, vomiting. She has had some diarrhea No urinary sx    See recent cardiology note 01/11/13;  Has a nonischemic CM. Past EF down to 25%. EF improved with medicines. Plans for an ICD in the past were cancelled. Other issues include tobacco abuse, HTN, PVCs, DM, valvular heart disease, spinal stenosis and glaucoma. Had a small bowel obstruction back in November of 2012 and had surgery with delayed wound healing.    Patient Active Problem List   Diagnosis Date Noted  . Abdominal wall skin ulcer 01/29/2012  . Ileus following gastrointestinal surgery 05/01/2011  . Hypothyroid 05/01/2011  . Dyslipidemia 05/01/2011  . GERD (gastroesophageal reflux disease) 05/01/2011  . Osteoarthritis 05/01/2011  . Cardiomyopathy 05/01/2011  . Mitral regurgitation 05/01/2011  . Tricuspid regurgitation 05/01/2011  . Chronic renal insufficiency 05/01/2011  . SBO (small bowel obstruction), s/p expl lap  04/24/2011  . Chest pain 10/18/2010  . Nonischemic cardiomyopathy   . HTN (hypertension)   . Tobacco abuse     Past Medical History  Diagnosis Date  . Nonischemic cardiomyopathy DR [facility]     EF is 25% per echo February 2012, non ischemic myoview 12/2009.  EF 50% echo 7/12 and plans for ICD cancelled.   Marland Kitchen HTN (hypertension)   . PVC (premature ventricular contraction)   . Valvular heart disease     Moderate to severe MR, moderate TR, LAE per echo 7/11  . Spinal stenosis   . Abdominal pain   . History of glaucoma   . Non-healing surgical wound     ABDOMINAL S/P EXPLORATOY LAPAROTOMY 04-18-2011  . History of small bowel obstruction 2009 AND NOV 2012  . Peptic ulcer disease   . GERD (gastroesophageal reflux disease)   . Chronic systolic CHF (congestive heart failure), NYHA class 2     EF 25% 2011 MV,  50-55% echo 6/13  . Moderate mitral regurgitation   . Moderate tricuspid regurgitation   . Arthritis     FEET, KNEES, HANDS  . H/O hiatal hernia   . Urge urinary incontinence   . Left ovarian cyst   . Diabetes mellitus type 2, diet-controlled   . Shortness of breath   . Wears dentures     upper denture-lower partial  . Hypothyroidism  Past Surgical History  Procedure Laterality Date  . Tubal ligation    . Glaucoma surgery  1978  . Exploratomy lap. / extensive lysis adhesions/ small bowel resection x2 with primary anastomosis x2  04-18-2011    SMALL BOWEL OBSTRUCTION  . Knee arthroscopy w/ meniscectomy  06-05-2004  . Transthoracic echocardiogram  12-27-2010    EF 45-50%/ MODERATE MV REGURG. / LEFT ATRIUM MILDLY DILATED/ MILD TRICUSPID REGURG.  . Cardiovascular stress test  JULY 2011-  DR HOCHREIN    NO ISCHEMIA  . Thyroidectomy  1978 GOITOR    AND CERVICAL COLD KNIFE CONE BX  . Tonsillectomy  AGE 63  . Benign right breast tumor removed    . Appendectomy  1979    RUPTURED  . Abdominal hernia repair  66 YRS AGO    PERITINITIS  . Ventral hernia repair  X4  LAST ONE  20 YRS AGO    ABDOMINAL --  EACH ONE IN DIFFERENT AREA  . Wound debridement  09/25/2011    Procedure: DEBRIDEMENT CLOSURE/ABDOMINAL WOUND;  Surgeon: Theodoro Kos, DO;  Location: [place];  Service: Plastics;  Laterality: N/A;  excision of abdominal wound with primary closure  . Ventral hernia repair  01/29/2012    Procedure: HERNIA REPAIR VENTRAL ADULT;  Surgeon: Theodoro Kos, DO;  Location: [place];  Service: Plastics;  Laterality: N/A;  . Hernia repair    . Incision and drainage of wound N/A 09/13/2012    Procedure: INCISION  AND DEBRIDEMENT WOUND abdominal wall ;  Surgeon: Rolm Bookbinder, MD;  Location: [place];  Service: General;  Laterality: N/A;  coordination with Dr Migdalia Dk     History  Substance Use Topics  . Smoking status: Former Smoker -- 30 years    Types: Cigarettes    Quit date: 09/22/2010  . Smokeless tobacco: Never Used     Comment: quit 2011  . Alcohol Use: No    Family History  Problem Relation Age of Onset  . Stroke Brother   . Stroke Sister   . Heart disease Sister     Allergies  Allergen Reactions  . Aspirin Other (See Comments)    HX Bleeding Ulcer   . Nsaids     Hx of bleeding ulcers  . Tolmetin Rash    Hx of bleeding ulcers    Medication list has been reviewed and updated.  Current Outpatient Prescriptions on File Prior to Visit  Medication Sig Dispense Refill  . ALPRAZolam (XANAX) 0.25 MG tablet Take 1 tablet (0.25 mg total) by mouth 3 (three) times daily as needed for anxiety.  90 tablet  5  . carvedilol (COREG) 12.5 MG tablet Take 1 tablet (12.5 mg total) by mouth 2 (two) times daily with a meal.  60 tablet  11  . famotidine (PEPCID) 20 MG tablet Take 20-40 mg by mouth 2 (two) times daily as needed for heartburn.      . furosemide (LASIX) 40 MG tablet Take 1 tablet (40 mg total) by mouth daily.  30 tablet  6  . HYDROcodone-acetaminophen (NORCO/VICODIN) 5-325 MG per tablet Take 1 tablet by mouth every 6 (six)  hours as needed.  30 tablet  0  . levothyroxine (SYNTHROID, LEVOTHROID) 88 MCG tablet Take 1 tablet (88 mcg total) by mouth daily.  30 tablet  5  . losartan (COZAAR) 25 MG tablet TAKE 1 TABLET DAILY.  30 tablet  3  . spironolactone (ALDACTONE) 25 MG tablet Take 0.5 tablets (12.5 mg total) by  mouth daily.  30 tablet  11   No current facility-administered medications on file prior to visit.    Review of Systems:  As per HPI- otherwise negative. History of appendectomy,  Still has her gallbladder  Physical Examination: Filed Vitals:   02/04/13 1344  BP: 126/72  Pulse: 86  Temp: 97.8 F (36.6 C)  Resp: 18   Filed Vitals:   02/04/13 1344  Height: 5\' 2"  (1.575 m)  Weight: 167 lb (75.751 kg)   Body mass index is 30.54 kg/(m^2). Ideal Body Weight: Weight in (lb) to have BMI = 25: 136.4  GEN: WDWN, NAD, Non-toxic, A & O x 3 HEENT: Atraumatic, Normocephalic. Neck supple. No masses, No LAD. Ears and Nose: No external deformity. CV: RRR, No M/G/R. No JVD. No thrill. No extra heart sounds. PULM: CTA B, no wheezes, crackles, rhonchi. No retractions. No resp. distress. No accessory muscle use. ABD: S, ND, +BS. No rebound. No HSM.  She has RUQ tenderness and a positive Murphy's sign.   EXTR: No c/c/e NEURO Normal gait.  PSYCH: Normally interactive. Conversant. Not depressed or anxious appearing.  Calm demeanor.   [facility] reading (PRIMARY) by  Dr. Lorelei Pont. CXR:  Possible minimal RLL infilrate Abdomen: history of small bowel resection.  Increased air left colon.    ACUTE ABDOMEN SERIES (ABDOMEN 2 VIEW & CHEST 1 VIEW)  Comparison: CT abdomen and pelvis 07/21/2012 [facility], 02/06/2012 [facility], and 05/27/2011 [facility].  Findings: Bowel gas pattern unremarkable without evidence of obstruction or significant ileus. On the erect image, there is suggestion of a large soft tissue mass in the mid upper abdomen, with mass effect upon the lesser curvature of the  J-shaped stomach. No free intraperitoneal air. No visible opaque urinary tract calculi. Degenerative changes involving the lumbar spine.  IMPRESSION: Possible mass in the mid upper abdomen. No acute abdominal abnormality otherwise.  Enhanced CT abdomen and pelvis may be helpful in further evaluation to confirm or deny this finding.   CHEST - 2 VIEW  Comparison: 09/25/2011  Findings: The heart size and mediastinal contours are within normal limits. Both lungs are essentially clear. There are a few densities along the medial right lung base but suspect this is related to overlying shadows based on the previous study. Trachea is midline. Bony thorax is intact.         Impression        No clear evidence for acute disease. Few densities at the medial right lung base as described. If there is high clinical concern for infection, consider treatment and followup imaging.          Results for orders placed in visit on 02/04/13  POCT CBC      Result Value Range   WBC 16.6 (*) 4.6 - 10.2 K/uL   Lymph, poc 2.5  0.6 - 3.4   POC LYMPH PERCENT 14.8  10 - 50 %L   MID (cbc) 0.8  0 - 0.9   POC MID % 4.9  0 - 12 %M   POC Granulocyte 13.3 (*) 2 - 6.9   Granulocyte percent 80.3 (*) 37 - 80 %G   RBC 3.82 (*) 4.04 - 5.48 M/uL   Hemoglobin 11.4 (*) 12.2 - 16.2 g/dL   HCT, POC 36.7 (*) 37.7 - 47.9 %   MCV 96.0  80 - 97 fL   MCH, POC 29.8  27 - 31.2 pg   MCHC 31.1 (*) 31.8 - 35.4 g/dL   RDW, POC  13.5     Platelet Count, POC 377  142 - 424 K/uL   MPV 7.9  0 - 99.8 fL  POCT UA - MICROSCOPIC ONLY      Result Value Range   WBC, Ur, HPF, POC 0-2     RBC, urine, microscopic 0-1     Bacteria, U Microscopic 1+     Mucus, UA large     Epithelial cells, urine per micros 0-1     Crystals, Ur, HPF, POC neg     Casts, Ur, LPF, POC neg     Yeast, UA neg    POCT URINALYSIS DIPSTICK      Result Value Range   Color, UA yellow     Clarity, UA clear     Glucose, UA neg     Bilirubin, UA  neg     Ketones, UA neg     Spec Grav, UA 1.015     Blood, UA neg     pH, UA 5.0     Protein, UA neg     Urobilinogen, UA 0.2     Nitrite, UA neg     Leukocytes, UA Negative      Assessment and Plan: Fever, unspecified - Plan: POCT CBC, DG Chest 2 View, DG Abd Acute W/Chest, CT Abdomen Pelvis W Contrast, BUN+Creat  Back pain - Plan: POCT UA - Microscopic Only, POCT urinalysis dipstick, Urine culture, CT Abdomen Pelvis W Contrast  Teresa [name] has a history of multiple abdominal operations.  She is here today with non- specific malaise and fevers, and leukocytosis.  abdominal tenderness is concerning.  Sent for CT scan for further evaluation.  Received plan abdominal film report as above but pt already at [facility] for CT scan.    Signed Lamar Blinks, MD   Received CT scan report:  CT ABDOMEN AND PELVIS WITH CONTRAST  TECHNIQUE: Multidetector CT imaging of the abdomen and pelvis was performed using the standard protocol following bolus administration of intravenous contrast.  CONTRAST: 135mL OMNIPAQUE IOHEXOL 300 MG/ML SOLN  COMPARISON: 07/21/2012.  FINDINGS: Interval increase in the size of the previously seen small fluid collection lateral to the right lobe of the liver inferiorly. On image number 22, this currently measures 7.7 x 3.2 cm in maximum dimensions and previously measured 4.3 x 1.0 cm in corresponding dimensions. A mildly thickened rim of surrounding enhancing tissue is noted.  No new fluid collections are seen. A large midline anterior abdominal wall hernia is again demonstrated at the level of the inferior pelvis, containing herniated small bowel without obstruction. A single dilated anastomotic bowel loop is again demonstrated in the left lower abdomen. Multiple sigmoid colon diverticula are again demonstrated without evidence of diverticulitis.  Unremarkable liver, spleen, pancreas, gallbladder, adrenal glands, kidneys, urinary bladder, uterus and ovaries.  The appendix is surgically absent. Clear lung bases. Lumbar lower thoracic spine degenerative changes. Atheromatous arterial calcifications.  IMPRESSION: 1. 7.7 x 3.2 cm enlarging abscess lateral to the inferior portion of the right lobe of the liver.  2. Large ventral hernia containing small bowel without obstruction.  3. Sigmoid diverticulosis.  Called and discussed with Katurah's daughter (who is with her) and surgeon on call Dr. Excell Seltzer.  She will report to the ER for triage and will see Dr. Excell Seltzer there.

## 2013-02-04 NOTE — Progress Notes (Signed)
ANTIBIOTIC CONSULT NOTE - INITIAL  Pharmacy Consult for zosyn Indication: abdominal abscess  Allergies  Allergen Reactions  . Aspirin Other (See Comments)    HX Bleeding Ulcer   . Nsaids     Hx of bleeding ulcers  . Tolmetin Rash    Hx of bleeding ulcers    Patient Measurements:   Adjusted Body Weight:   Vital Signs: Temp: 97.6 F (36.4 C) (08/22 1940) Temp src: Oral (08/22 1940) BP: 116/61 mmHg (08/22 1940) Pulse Rate: 75 (08/22 1940) Intake/Output from previous day:   Intake/Output from this shift:    Labs:  Recent Labs  02/04/13 1419 02/04/13 1639 02/04/13 2016  WBC 16.6*  --   --   HGB 11.4*  --   --   CREATININE  --  0.92 0.90   The CrCl is unknown because both a height and weight (above a minimum accepted value) are required for this calculation. No results found for this basename: VANCOTROUGH, VANCOPEAK, VANCORANDOM, GENTTROUGH, GENTPEAK, GENTRANDOM, TOBRATROUGH, TOBRAPEAK, TOBRARND, AMIKACINPEAK, AMIKACINTROU, AMIKACIN,  in the last 72 hours   Microbiology: No results found for this or any previous visit (from the past 720 hour(s)).  Medical History: Past Medical History  Diagnosis Date  . Nonischemic cardiomyopathy DR [facility]     EF is 25% per echo February 2012, non ischemic myoview 12/2009.  EF 50% echo 7/12 and plans for ICD cancelled.   Marland Kitchen HTN (hypertension)   . PVC (premature ventricular contraction)   . Valvular heart disease     Moderate to severe MR, moderate TR, LAE per echo 7/11  . Spinal stenosis   . Abdominal pain   . History of glaucoma   . Non-healing surgical wound     ABDOMINAL S/P EXPLORATOY LAPAROTOMY 04-18-2011  . History of small bowel obstruction 2009 AND NOV 2012  . Peptic ulcer disease   . GERD (gastroesophageal reflux disease)   . Chronic systolic CHF (congestive heart failure), NYHA class 2     EF 25% 2011 MV,  50-55% echo 6/13  . Moderate mitral regurgitation   . Moderate tricuspid regurgitation   . Arthritis    FEET, KNEES, HANDS  . H/O hiatal hernia   . Urge urinary incontinence   . Left ovarian cyst   . Diabetes mellitus type 2, diet-controlled   . Shortness of breath   . Wears dentures     upper denture-lower partial  . Hypothyroidism    Assessment: 63 YOF with abdominal wall abscess, She has prior h/o abdominal wall infections s/p small bowel resection. IR to see for percutaneous drain  Goal of Therapy:  Appropriately dose for renal fx  Plan:   Zosyn 3.375gm IV q8h each dose over 4h infusion  Doreene Eland, PharmD, BCPS.   Pager: [phone]  02/04/2013,9:33 PM

## 2013-02-04 NOTE — ED Notes (Signed)
[id number] ASSIGNED @2105 

## 2013-02-04 NOTE — Progress Notes (Signed)
Called for report Jie Stickels, RN

## 2013-02-04 NOTE — H&P (Signed)
Teresa [name] is an 72 y.o. female.   Chief Complaint: fatigue and right-sided abdominal pain  HPI: patient is a very pleasant 72 year old female with an extensive surgical history over the last couple of years. About 2 years ago she underwent laparotomy and small bowel resection for small bowel obstruction. She had abdominal mesh in place from previous hernia repairs. She apparently developed infection related to this and has had initially a large open abdominal wound postoperatively and subsequently some recurrent abdominal wounds that have required debridement and closure on several occasions. She has been cared for by Dr. Donne Hazel and Dr. Migdalia Dk. She last had a wound debridement about 6 weeks ago. For about the past 2 weeks she has had gradually worsening symptoms consisting mainly of fatigue and malaise and also right sided flank or abdominal pain is made worse with any movement. More recently she has had some fever and chills as well. She has noted some shortness of breath only on exertion. She presented to Dr. Leafy Ro office today for routine followup of a residual small midline abdominal wound in the lower abdomen and complained of these symptoms and appeared ill. She was evaluated then by Dr. Edilia Bo who obtained a CT scan showing a right sided abdominal abscess as described below on the CT scan. The patient states that about 6 months ago she was having some similar right-sided pain no not as severe. At that time she developed spontaneous opening of her midline incision with drainage and the right-sided abdominal pain resolved.  Past Medical History  Diagnosis Date  . Nonischemic cardiomyopathy DR [facility]     EF is 25% per echo February 2012, non ischemic myoview 12/2009.  EF 50% echo 7/12 and plans for ICD cancelled.   Marland Kitchen HTN (hypertension)   . PVC (premature ventricular contraction)   . Valvular heart disease     Moderate to severe MR, moderate TR, LAE per echo 7/11  . Spinal stenosis   .  Abdominal pain   . History of glaucoma   . Non-healing surgical wound     ABDOMINAL S/P EXPLORATOY LAPAROTOMY 04-18-2011  . History of small bowel obstruction 2009 AND NOV 2012  . Peptic ulcer disease   . GERD (gastroesophageal reflux disease)   . Chronic systolic CHF (congestive heart failure), NYHA class 2     EF 25% 2011 MV,  50-55% echo 6/13  . Moderate mitral regurgitation   . Moderate tricuspid regurgitation   . Arthritis     FEET, KNEES, HANDS  . H/O hiatal hernia   . Urge urinary incontinence   . Left ovarian cyst   . Diabetes mellitus type 2, diet-controlled   . Shortness of breath   . Wears dentures     upper denture-lower partial  . Hypothyroidism     Past Surgical History  Procedure Laterality Date  . Tubal ligation    . Glaucoma surgery  1978  . Exploratomy lap. / extensive lysis adhesions/ small bowel resection x2 with primary anastomosis x2  04-18-2011    SMALL BOWEL OBSTRUCTION  . Knee arthroscopy w/ meniscectomy  06-05-2004  . Transthoracic echocardiogram  12-27-2010    EF 45-50%/ MODERATE MV REGURG. / LEFT ATRIUM MILDLY DILATED/ MILD TRICUSPID REGURG.  . Cardiovascular stress test  JULY 2011-  DR HOCHREIN    NO ISCHEMIA  . Thyroidectomy  1978 GOITOR    AND CERVICAL COLD KNIFE CONE BX  . Tonsillectomy  AGE 56  . Benign right breast tumor removed    .  Appendectomy  1979    RUPTURED  . Abdominal hernia repair  28 YRS AGO    PERITINITIS  . Ventral hernia repair  X4  LAST ONE 20 YRS AGO    ABDOMINAL --  EACH ONE IN DIFFERENT AREA  . Wound debridement  09/25/2011    Procedure: DEBRIDEMENT CLOSURE/ABDOMINAL WOUND;  Surgeon: Theodoro Kos, DO;  Location: [facility];  Service: Plastics;  Laterality: N/A;  excision of abdominal wound with primary closure  . Ventral hernia repair  01/29/2012    Procedure: HERNIA REPAIR VENTRAL ADULT;  Surgeon: Theodoro Kos, DO;  Location: [place];  Service: Plastics;  Laterality: N/A;  . Hernia  repair    . Incision and drainage of wound N/A 09/13/2012    Procedure: INCISION  AND DEBRIDEMENT WOUND abdominal wall ;  Surgeon: Rolm Bookbinder, MD;  [facility] OR;  Service: General;  Laterality: N/A;  coordination with Dr Migdalia Dk     Family History  Problem Relation Age of Onset  . Stroke Brother   . Stroke Sister   . Heart disease Sister    Social History:  reports that she quit smoking about 2 years ago. Her smoking use included Cigarettes. She smoked 0.00 packs per day for 30 years. She has never used smokeless tobacco. She reports that she does not drink alcohol or use illicit drugs.  Allergies:  Allergies  Allergen Reactions  . Aspirin Other (See Comments)    HX Bleeding Ulcer   . Nsaids     Hx of bleeding ulcers  . Tolmetin Rash    Hx of bleeding ulcers     (Not in a hospital admission)  Results for orders placed during the hospital encounter of 02/04/13 (from the past 48 hour(s))  BUN     Status: None   Collection Time    02/04/13  4:39 PM      Result Value Range   BUN 17  6 - 23 mg/dL  CREATININE, SERUM     Status: Abnormal   Collection Time    02/04/13  4:39 PM      Result Value Range   Creatinine, Ser 0.92  0.50 - 1.10 mg/dL   GFR calc non Af Amer 61 (*) >90 mL/min   GFR calc Af Amer 70 (*) >90 mL/min   Comment: (NOTE)     The eGFR has been calculated using the CKD EPI equation.     This calculation has not been validated in all clinical situations.     eGFR's persistently <90 mL/min signify possible Chronic Kidney     Disease.   Dg Chest 2 View  02/04/2013   *RADIOLOGY REPORT*  Clinical Data: Fever and right flank pain.  CHEST - 2 VIEW  Comparison:  09/25/2011  Findings:  The heart size and mediastinal contours are within normal limits. Both lungs are essentially clear.  There are a few densities along the medial right lung base but suspect this is related to overlying shadows based on the previous study.  Trachea is midline.  Bony thorax is intact.   IMPRESSION: No clear evidence for acute disease.  Few densities at the medial right lung base as described.  If there is high clinical concern for infection, consider treatment and followup imaging.   Original Report Authenticated By: Markus Daft, M.D.   Ct Abdomen Pelvis W Contrast  02/04/2013   CLINICAL DATA:  Mid abdominal pain for the past 2-3 weeks. Leukocytosis. Open wound from abdominal surgery 2  years ago.  EXAM: CT ABDOMEN AND PELVIS WITH CONTRAST  TECHNIQUE: Multidetector CT imaging of the abdomen and pelvis was performed using the standard protocol following bolus administration of intravenous contrast.  CONTRAST:  181mL OMNIPAQUE IOHEXOL 300 MG/ML  SOLN  COMPARISON:  07/21/2012.  FINDINGS: Interval increase in the size of the previously seen small fluid collection lateral to the right lobe of the liver inferiorly. On image number 22, this currently measures 7.7 x 3.2 cm in maximum dimensions and previously measured 4.3 x 1.0 cm in corresponding dimensions. A mildly thickened rim of surrounding enhancing tissue is noted.  No new fluid collections are seen. A large midline anterior abdominal wall hernia is again demonstrated at the level of the inferior pelvis, containing herniated small bowel without obstruction. A single dilated anastomotic bowel loop is again demonstrated in the left lower abdomen. Multiple sigmoid colon diverticula are again demonstrated without evidence of diverticulitis.  Unremarkable liver, spleen, pancreas, gallbladder, adrenal glands, kidneys, urinary bladder, uterus and ovaries. The appendix is surgically absent. Clear lung bases. Lumbar lower thoracic spine degenerative changes. Atheromatous arterial calcifications.  IMPRESSION: 1. 7.7 x 3.2 cm enlarging abscess lateral to the inferior portion of the right lobe of the liver.  2.  Large ventral hernia containing small bowel without obstruction.  3.  Sigmoid diverticulosis.   Electronically Signed   By: Enrique Sack   On:  02/04/2013 18:06   Dg Abd Acute W/chest  02/04/2013   *RADIOLOGY REPORT*  Clinical Data: Right flank pain.  Fever.  ACUTE ABDOMEN SERIES (ABDOMEN 2 VIEW & CHEST 1 VIEW)  Comparison: CT abdomen and pelvis 07/21/2012 [facility], 02/06/2012 [facility], and 05/27/2011 [facility].  Findings: Bowel gas pattern unremarkable without evidence of obstruction or significant ileus.  On the erect image, there is suggestion of a large soft tissue mass in the mid upper abdomen, with mass effect upon the lesser curvature of the J-shaped stomach. No free intraperitoneal air.  No visible opaque urinary tract calculi.  Degenerative changes involving the lumbar spine.  IMPRESSION: Possible mass in the mid upper abdomen.  No acute abdominal abnormality otherwise.  Enhanced CT abdomen and pelvis may be helpful in further evaluation to confirm or deny this finding.  Clinically significant discrepancy from primary report, if provided: None provided.  These results will be called to the ordering clinician or representative by the Radiologist Assistant, and communication documented in the PACS Dashboard.   Original Report Authenticated By: Evangeline Dakin, M.D.    Review of Systems  Constitutional: Positive for fever, chills and malaise/fatigue.  HENT: Negative.   Respiratory: Positive for shortness of breath. Negative for cough, sputum production and wheezing.   Cardiovascular: Negative.   Gastrointestinal: Positive for abdominal pain. Negative for nausea, vomiting, diarrhea, constipation and blood in stool.  Genitourinary: Negative.   Neurological: Positive for weakness.    Blood pressure 116/61, pulse 75, temperature 97.6 F (36.4 C), temperature source Oral, resp. rate 16, SpO2 99.00%. Physical Exam  General: Alert, pleasant elderly Caucasian female who appears mildly ill but in no distress Skin: Warm and dry without rash or infection. HEENT: No palpable masses or thyromegaly. Sclera  nonicteric. Pupils equal round and reactive. Oropharynx clear. Lymph nodes: No cervical, supraclavicular, or inguinal nodes palpable. Lungs: Breath sounds clear and equal without increased work of breathing Cardiovascular: Regular rate and rhythm without murmur. No JVD or edema. Peripheral pulses intact. Abdomen: Nondistended. There is well localized tenderness in the lateral right upper quadrant toward  the right flank.. No masses palpable. No organomegaly. There is a long midline incision with a very small, less than 1 cm, superficial open wound in the lower abdomen covered with DuoDERM. There is a moderate lower abdominal ventral hernia that is soft and nontender. Extremities: No edema or joint swelling or deformity. No chronic venous stasis changes. Neurologic: Alert and fully oriented. Gait normal.  Assessment/Plan Patient with history of abdominal wall infections status post laparotomy and small bowel resection 2 years ago. She has had previously placed mesh and felt to be the source of infection. By history she apparently had some spontaneous drainage through her wound of possibly this right upper quadrant abdominal abscess some months ago. She now has a clearly enlarged right upper quadrant abscess with fever and leukocytosis, pain and malaise. Patient will be admitted for IV antibiotics and I have discussed percutaneous drainage of her abdominal abscess with interventional radiology.  Zulema Pulaski T 02/04/2013, 8:10 PM

## 2013-02-04 NOTE — ED Notes (Signed)
Bed: [id number] Expected date:  Expected time:  Means of arrival:  Comments: Hold for [employer]

## 2013-02-04 NOTE — Patient Instructions (Addendum)
Please proceed to [facility] radiology department for your CT scan.  I will talk to you on the phone once the CT is complete.   Drink one bottle of contrast now and another in one hour.   You will have your kidney function checked (blood test) prior to your scan.

## 2013-02-05 ENCOUNTER — Encounter (HOSPITAL_COMMUNITY): Payer: Self-pay

## 2013-02-05 ENCOUNTER — Inpatient Hospital Stay (HOSPITAL_COMMUNITY): Payer: Medicare Other

## 2013-02-05 MED ORDER — FENTANYL CITRATE 0.05 MG/ML IJ SOLN
INTRAMUSCULAR | Status: AC | PRN
  Administered 2013-02-05 (×2): 50 ug via INTRAVENOUS

## 2013-02-05 MED ORDER — FENTANYL CITRATE 0.05 MG/ML IJ SOLN
INTRAMUSCULAR | Status: AC
  Filled 2013-02-05: qty 6

## 2013-02-05 MED ORDER — MIDAZOLAM HCL 2 MG/2ML IJ SOLN
INTRAMUSCULAR | Status: AC
  Filled 2013-02-05: qty 6

## 2013-02-05 MED ORDER — MIDAZOLAM HCL 2 MG/2ML IJ SOLN
INTRAMUSCULAR | Status: AC | PRN
  Administered 2013-02-05 (×3): 1 mg via INTRAVENOUS

## 2013-02-05 NOTE — Progress Notes (Signed)
Patient ID: Teresa [name], female   DOB: December 30, 1940, 72 y.o.   MRN: [id number]    Subjective: Feels better. Still some right-sided pain. Slept well. Feels hungry.  Objective: Vital signs in last 24 hours: Temp:  [97.6 F (36.4 C)-99.7 F (37.6 C)] 99.7 F (37.6 C) (08/23 0629) Pulse Rate:  [69-86] 69 (08/23 0629) Resp:  [16-18] 16 (08/23 0629) BP: (116-126)/(49-72) 125/49 mmHg (08/23 0629) SpO2:  [99 %-100 %] 100 % (08/23 0629) Weight:  [167 lb (75.751 kg)-168 lb 0.9 oz (76.23 kg)] 168 lb 0.9 oz (76.23 kg) (08/22 2230)    Intake/Output from previous day: 08/22 0701 - 08/23 0700 In: 731.3 [I.V.:631.3; IV Piggyback:100] Out: -  Intake/Output this shift:    General appearance: alert, cooperative and no distress GI: now minimal tenderness in the right upper quadrant/lateral abdomen  Lab Results:   Recent Labs  02/04/13 1419  WBC 16.6*  HGB 11.4*  HCT 36.7*   BMET  Recent Labs  02/04/13 1639 02/04/13 2016  NA  --  132*  K  --  3.6  CL  --  98  CO2  --  23  GLUCOSE  --  120*  BUN 17 16  CREATININE 0.92 0.90  CALCIUM  --  9.0     Studies/Results: Dg Chest 2 View  02/04/2013   *RADIOLOGY REPORT*  Clinical Data: Fever and right flank pain.  CHEST - 2 VIEW  Comparison:  09/25/2011  Findings:  The heart size and mediastinal contours are within normal limits. Both lungs are essentially clear.  There are a few densities along the medial right lung base but suspect this is related to overlying shadows based on the previous study.  Trachea is midline.  Bony thorax is intact.  IMPRESSION: No clear evidence for acute disease.  Few densities at the medial right lung base as described.  If there is high clinical concern for infection, consider treatment and followup imaging.   Original Report Authenticated By: Markus Daft, M.D.   Ct Abdomen Pelvis W Contrast  02/04/2013   CLINICAL DATA:  Mid abdominal pain for the past 2-3 weeks. Leukocytosis. Open wound from abdominal surgery 2  years ago.  EXAM: CT ABDOMEN AND PELVIS WITH CONTRAST  TECHNIQUE: Multidetector CT imaging of the abdomen and pelvis was performed using the standard protocol following bolus administration of intravenous contrast.  CONTRAST:  136mL OMNIPAQUE IOHEXOL 300 MG/ML  SOLN  COMPARISON:  07/21/2012.  FINDINGS: Interval increase in the size of the previously seen small fluid collection lateral to the right lobe of the liver inferiorly. On image number 22, this currently measures 7.7 x 3.2 cm in maximum dimensions and previously measured 4.3 x 1.0 cm in corresponding dimensions. A mildly thickened rim of surrounding enhancing tissue is noted.  No new fluid collections are seen. A large midline anterior abdominal wall hernia is again demonstrated at the level of the inferior pelvis, containing herniated small bowel without obstruction. A single dilated anastomotic bowel loop is again demonstrated in the left lower abdomen. Multiple sigmoid colon diverticula are again demonstrated without evidence of diverticulitis.  Unremarkable liver, spleen, pancreas, gallbladder, adrenal glands, kidneys, urinary bladder, uterus and ovaries. The appendix is surgically absent. Clear lung bases. Lumbar lower thoracic spine degenerative changes. Atheromatous arterial calcifications.  IMPRESSION: 1. 7.7 x 3.2 cm enlarging abscess lateral to the inferior portion of the right lobe of the liver.  2.  Large ventral hernia containing small bowel without obstruction.  3.  Sigmoid diverticulosis.  Electronically Signed   By: Enrique Sack   On: 02/04/2013 18:06   Dg Abd Acute W/chest  02/04/2013   *RADIOLOGY REPORT*  Clinical Data: Right flank pain.  Fever.  ACUTE ABDOMEN SERIES (ABDOMEN 2 VIEW & CHEST 1 VIEW)  Comparison: CT abdomen and pelvis 07/21/2012 [facility], 02/06/2012 [facility], and 05/27/2011 [facility].  Findings: Bowel gas pattern unremarkable without evidence of obstruction or significant ileus.  On the erect  image, there is suggestion of a large soft tissue mass in the mid upper abdomen, with mass effect upon the lesser curvature of the J-shaped stomach. No free intraperitoneal air.  No visible opaque urinary tract calculi.  Degenerative changes involving the lumbar spine.  IMPRESSION: Possible mass in the mid upper abdomen.  No acute abdominal abnormality otherwise.  Enhanced CT abdomen and pelvis may be helpful in further evaluation to confirm or deny this finding.  Clinically significant discrepancy from primary report, if provided: None provided.  These results will be called to the ordering clinician or representative by the Radiologist Assistant, and communication documented in the PACS Dashboard.   Original Report Authenticated By: Evangeline Dakin, M.D.    Anti-infectives: Anti-infectives   Start     Dose/Rate Route Frequency Ordered Stop   02/04/13 2230  piperacillin-tazobactam (ZOSYN) IVPB 3.375 g     3.375 g 12.5 mL/hr over 240 Minutes Intravenous 3 times per day 02/04/13 2140        Assessment/Plan: Right upper quadrant intra-abdominal abscess. History of laparotomy and small bowel resection 2 years ago with recurrent wound infections. For percutaneous drainage of intra-abdominal abscess this morning.    LOS: 1 day    Yadir Zentner T 02/05/2013

## 2013-02-05 NOTE — Procedures (Signed)
CT RUQ abscess drain 5ml purulent out, for GS, C&S No complication No blood loss. See complete dictation in [facility].

## 2013-02-06 LAB — URINE CULTURE

## 2013-02-06 LAB — CBC
HCT: 28.4 % — ABNORMAL LOW (ref 36.0–46.0)
MCHC: 32 g/dL (ref 30.0–36.0)
Platelets: 273 10*3/uL (ref 150–400)
RDW: 12.9 % (ref 11.5–15.5)

## 2013-02-06 LAB — BASIC METABOLIC PANEL
BUN: 9 mg/dL (ref 6–23)
GFR calc Af Amer: 72 mL/min — ABNORMAL LOW (ref >90)
GFR calc non Af Amer: 62 mL/min — ABNORMAL LOW (ref >90)
Potassium: 3.7 mEq/L (ref 3.5–5.1)
Sodium: 137 mEq/L (ref 135–145)

## 2013-02-06 MED ORDER — ZOLPIDEM TARTRATE 10 MG PO TABS
10.0000 mg | ORAL_TABLET | Freq: Every evening | ORAL | Status: DC | PRN
  Administered 2013-02-06: 10 mg via ORAL
  Filled 2013-02-06: qty 1

## 2013-02-06 NOTE — Progress Notes (Signed)
Patient ID: Teresa [name], female   DOB: [birth year], 72 y.o.   MRN: [id number]    Subjective: Did not sleep well. However her pain is significantly improved. No other complaints. Percutaneous drainage went well.  Objective: Vital signs in last 24 hours: Temp:  [97.9 F (36.6 C)-98.2 F (36.8 C)] 97.9 F (36.6 C) (08/24 0549) Pulse Rate:  [63-84] 63 (08/24 0549) Resp:  [18-26] 18 (08/24 0549) BP: (95-121)/(40-66) 121/54 mmHg (08/24 0549) SpO2:  [86 %-100 %] 97 % (08/24 0549)    Intake/Output from previous day: 08/23 0701 - 08/24 0700 In: 2535 [P.O.:960; I.V.:1470; IV Piggyback:100] Out: 410 [Urine:350; Drains:60] Intake/Output this shift:    General appearance: alert, cooperative and no distress GI: now only minimal tenderness just around the percutaneous drain site Incision/Wound: drain in place with bloody fluid.  Lab Results:   Recent Labs  02/04/13 1419 02/06/13 0418  WBC 16.6* 9.0  HGB 11.4* 9.1*  HCT 36.7* 28.4*  PLT  --  273   BMET  Recent Labs  02/04/13 2016 02/06/13 0418  NA 132* 137  K 3.6 3.7  CL 98 107  CO2 23 23  GLUCOSE 120* 118*  BUN 16 9  CREATININE 0.90 0.90  CALCIUM 9.0 8.5   Gram stain and cultures are pending.  Studies/Results: Dg Chest 2 View  02/04/2013   *RADIOLOGY REPORT*  Clinical Data: Fever and right flank pain.  CHEST - 2 VIEW  Comparison:  09/25/2011  Findings:  The heart size and mediastinal contours are within normal limits. Both lungs are essentially clear.  There are a few densities along the medial right lung base but suspect this is related to overlying shadows based on the previous study.  Trachea is midline.  Bony thorax is intact.  IMPRESSION: No clear evidence for acute disease.  Few densities at the medial right lung base as described.  If there is high clinical concern for infection, consider treatment and followup imaging.   Original Report Authenticated By: Markus Daft, M.D.   Ct Guided Abscess Drain  02/05/2013    *RADIOLOGY REPORT*  Clinical data:  Previous small bowel resection, abdominal hernia mesh post infection and debridement.  Worsening right flank pain, with abscess noted on recent CT.  CT-GUIDED PERITONEAL ABSCESS DRAINAGE CATHETER PLACEMENT  Technique and findings: The procedure, risks (including but not limited to bleeding, infection, organ damage), benefits, and alternatives were explained to the patient.  Questions regarding the procedure were encouraged and answered.  The patient understands and consents to the procedure.The patient placed supine and select axial scans through the upper abdomen obtained.  The collection was localized and an appropriate skin entry site determined.  Site was marked, prepped with Betadine, draped in usual sterile fashion, infiltrated locally with 1% lidocaine.  Intravenous Fentanyl and Versed were administered as conscious sedation during continuous cardiorespiratory monitoring by the radiology RN, with a total moderate sedation time of 12 minutes.  Under CT fluoroscopic guidance, a 19 gauge percutaneous entry needle was advanced into the collection.  Purulent fluid could be aspirated.  An Amplatz wire advanced easily within the collection, its position confirmed on CT.  Tract was dilated to facilitate placement of a 12-French pigtail catheter.  Approximate 30 ml of purulent material were aspirated, sent for Gram stain, culture and sensitivity.  The catheter was secured externally with O-Prolene suture and Statlock and placed to gravity bag.  CT confirms appropriate catheter position. The patient tolerated the procedure well.  No immediate complication.  IMPRESSION: Technically  successful CT-guided peritoneal abscess drainage catheter placement.   Original Report Authenticated By: D. Wallace Going, MD   Ct Abdomen Pelvis W Contrast  02/04/2013   CLINICAL DATA:  Mid abdominal pain for the past 2-3 weeks. Leukocytosis. Open wound from abdominal surgery 2 years ago.  EXAM: CT  ABDOMEN AND PELVIS WITH CONTRAST  TECHNIQUE: Multidetector CT imaging of the abdomen and pelvis was performed using the standard protocol following bolus administration of intravenous contrast.  CONTRAST:  1106mL OMNIPAQUE IOHEXOL 300 MG/ML  SOLN  COMPARISON:  07/21/2012.  FINDINGS: Interval increase in the size of the previously seen small fluid collection lateral to the right lobe of the liver inferiorly. On image number 22, this currently measures 7.7 x 3.2 cm in maximum dimensions and previously measured 4.3 x 1.0 cm in corresponding dimensions. A mildly thickened rim of surrounding enhancing tissue is noted.  No new fluid collections are seen. A large midline anterior abdominal wall hernia is again demonstrated at the level of the inferior pelvis, containing herniated small bowel without obstruction. A single dilated anastomotic bowel loop is again demonstrated in the left lower abdomen. Multiple sigmoid colon diverticula are again demonstrated without evidence of diverticulitis.  Unremarkable liver, spleen, pancreas, gallbladder, adrenal glands, kidneys, urinary bladder, uterus and ovaries. The appendix is surgically absent. Clear lung bases. Lumbar lower thoracic spine degenerative changes. Atheromatous arterial calcifications.  IMPRESSION: 1. 7.7 x 3.2 cm enlarging abscess lateral to the inferior portion of the right lobe of the liver.  2.  Large ventral hernia containing small bowel without obstruction.  3.  Sigmoid diverticulosis.   Electronically Signed   By: Enrique Sack   On: 02/04/2013 18:06   Dg Abd Acute W/chest  02/04/2013   *RADIOLOGY REPORT*  Clinical Data: Right flank pain.  Fever.  ACUTE ABDOMEN SERIES (ABDOMEN 2 VIEW & CHEST 1 VIEW)  Comparison: CT abdomen and pelvis 07/21/2012 [facility], 02/06/2012 [facility], and 05/27/2011 [facility].  Findings: Bowel gas pattern unremarkable without evidence of obstruction or significant ileus.  On the erect image, there is  suggestion of a large soft tissue mass in the mid upper abdomen, with mass effect upon the lesser curvature of the J-shaped stomach. No free intraperitoneal air.  No visible opaque urinary tract calculi.  Degenerative changes involving the lumbar spine.  IMPRESSION: Possible mass in the mid upper abdomen.  No acute abdominal abnormality otherwise.  Enhanced CT abdomen and pelvis may be helpful in further evaluation to confirm or deny this finding.  Clinically significant discrepancy from primary report, if provided: None provided.  These results will be called to the ordering clinician or representative by the Radiologist Assistant, and communication documented in the PACS Dashboard.   Original Report Authenticated By: Evangeline Dakin, M.D.    Anti-infectives: Anti-infectives   Start     Dose/Rate Route Frequency Ordered Stop   02/04/13 2230  piperacillin-tazobactam (ZOSYN) IVPB 3.375 g     3.375 g 12.5 mL/hr over 240 Minutes Intravenous 3 times per day 02/04/13 2140        Assessment/Plan: Status post percutaneous drainage of right upper quadrant abscess. Origin not clear although she has had recurrent infections for some time. Much improved after percutaneous drainage. White count normalized. Patient is very anxious to go home. I told her she might be ready for discharge tomorrow on oral antibiotics.    LOS: 2 days    Eian Vandervelden T 02/06/2013

## 2013-02-06 NOTE — Progress Notes (Signed)
Subjective: Feels much better this am. Pain improved.  Objective: Physical Exam: BP 121/54  Pulse 63  Temp(Src) 97.9 F (36.6 C) (Oral)  Resp 18  Ht 5\' 2"  (1.575 m)  Wt 168 lb 0.9 oz (76.23 kg)  BMI 30.73 kg/m2  SpO2 97% RUQ drain intact, site clean, min tender Output bloody purulent, 58mL recorded yesterday, ~15-33mL in bag this am.   Labs: CBC  Recent Labs  02/04/13 1419 02/06/13 0418  WBC 16.6* 9.0  HGB 11.4* 9.1*  HCT 36.7* 28.4*  PLT  --  273   BMET  Recent Labs  02/04/13 2016 02/06/13 0418  NA 132* 137  K 3.6 3.7  CL 98 107  CO2 23 23  GLUCOSE 120* 118*  BUN 16 9  CREATININE 0.90 0.90  CALCIUM 9.0 8.5   LFT  Recent Labs  02/04/13 2016  PROT 7.1  ALBUMIN 3.0*  AST 10  ALT 10  ALKPHOS 118*  BILITOT 0.3   PT/INR  Recent Labs  02/04/13 2016  LABPROT 13.8  INR 1.08     Studies/Results: Dg Chest 2 View  02/04/2013   *RADIOLOGY REPORT*  Clinical Data: Fever and right flank pain.  CHEST - 2 VIEW  Comparison:  09/25/2011  Findings:  The heart size and mediastinal contours are within normal limits. Both lungs are essentially clear.  There are a few densities along the medial right lung base but suspect this is related to overlying shadows based on the previous study.  Trachea is midline.  Bony thorax is intact.  IMPRESSION: No clear evidence for acute disease.  Few densities at the medial right lung base as described.  If there is high clinical concern for infection, consider treatment and followup imaging.   Original Report Authenticated By: Markus Daft, M.D.   Ct Guided Abscess Drain  02/05/2013   *RADIOLOGY REPORT*  Clinical data:  Previous small bowel resection, abdominal hernia mesh post infection and debridement.  Worsening right flank pain, with abscess noted on recent CT.  CT-GUIDED PERITONEAL ABSCESS DRAINAGE CATHETER PLACEMENT  Technique and findings: The procedure, risks (including but not limited to bleeding, infection, organ damage),  benefits, and alternatives were explained to the patient.  Questions regarding the procedure were encouraged and answered.  The patient understands and consents to the procedure.The patient placed supine and select axial scans through the upper abdomen obtained.  The collection was localized and an appropriate skin entry site determined.  Site was marked, prepped with Betadine, draped in usual sterile fashion, infiltrated locally with 1% lidocaine.  Intravenous Fentanyl and Versed were administered as conscious sedation during continuous cardiorespiratory monitoring by the radiology RN, with a total moderate sedation time of 12 minutes.  Under CT fluoroscopic guidance, a 19 gauge percutaneous entry needle was advanced into the collection.  Purulent fluid could be aspirated.  An Amplatz wire advanced easily within the collection, its position confirmed on CT.  Tract was dilated to facilitate placement of a 12-French pigtail catheter.  Approximate 30 ml of purulent material were aspirated, sent for Gram stain, culture and sensitivity.  The catheter was secured externally with O-Prolene suture and Statlock and placed to gravity bag.  CT confirms appropriate catheter position. The patient tolerated the procedure well.  No immediate complication.  IMPRESSION: Technically successful CT-guided peritoneal abscess drainage catheter placement.   Original Report Authenticated By: D. Wallace Going, MD   Ct Abdomen Pelvis W Contrast  02/04/2013   CLINICAL DATA:  Mid abdominal pain for the past 2-3  weeks. Leukocytosis. Open wound from abdominal surgery 2 years ago.  EXAM: CT ABDOMEN AND PELVIS WITH CONTRAST  TECHNIQUE: Multidetector CT imaging of the abdomen and pelvis was performed using the standard protocol following bolus administration of intravenous contrast.  CONTRAST:  11mL OMNIPAQUE IOHEXOL 300 MG/ML  SOLN  COMPARISON:  07/21/2012.  FINDINGS: Interval increase in the size of the previously seen small fluid collection  lateral to the right lobe of the liver inferiorly. On image number 22, this currently measures 7.7 x 3.2 cm in maximum dimensions and previously measured 4.3 x 1.0 cm in corresponding dimensions. A mildly thickened rim of surrounding enhancing tissue is noted.  No new fluid collections are seen. A large midline anterior abdominal wall hernia is again demonstrated at the level of the inferior pelvis, containing herniated small bowel without obstruction. A single dilated anastomotic bowel loop is again demonstrated in the left lower abdomen. Multiple sigmoid colon diverticula are again demonstrated without evidence of diverticulitis.  Unremarkable liver, spleen, pancreas, gallbladder, adrenal glands, kidneys, urinary bladder, uterus and ovaries. The appendix is surgically absent. Clear lung bases. Lumbar lower thoracic spine degenerative changes. Atheromatous arterial calcifications.  IMPRESSION: 1. 7.7 x 3.2 cm enlarging abscess lateral to the inferior portion of the right lobe of the liver.  2.  Large ventral hernia containing small bowel without obstruction.  3.  Sigmoid diverticulosis.   Electronically Signed   By: Enrique Sack   On: 02/04/2013 18:06   Dg Abd Acute W/chest  02/04/2013   *RADIOLOGY REPORT*  Clinical Data: Right flank pain.  Fever.  ACUTE ABDOMEN SERIES (ABDOMEN 2 VIEW & CHEST 1 VIEW)  Comparison: CT abdomen and pelvis 07/21/2012 [facility], 02/06/2012 [facility], and 05/27/2011 [facility].  Findings: Bowel gas pattern unremarkable without evidence of obstruction or significant ileus.  On the erect image, there is suggestion of a large soft tissue mass in the mid upper abdomen, with mass effect upon the lesser curvature of the J-shaped stomach. No free intraperitoneal air.  No visible opaque urinary tract calculi.  Degenerative changes involving the lumbar spine.  IMPRESSION: Possible mass in the mid upper abdomen.  No acute abdominal abnormality otherwise.  Enhanced CT  abdomen and pelvis may be helpful in further evaluation to confirm or deny this finding.  Clinically significant discrepancy from primary report, if provided: None provided.  These results will be called to the ordering clinician or representative by the Radiologist Assistant, and communication documented in the PACS Dashboard.   Original Report Authenticated By: Evangeline Dakin, M.D.    Assessment/Plan: S/p perc drain RUQ abscess WBC down to 9.0 Cx pending Drain flushes daily after DC Pt reports poss DC for tomorrow.    LOS: 2 days    Ascencion Dike PA-C 02/06/2013 10:14 AM

## 2013-02-07 ENCOUNTER — Telehealth (INDEPENDENT_AMBULATORY_CARE_PROVIDER_SITE_OTHER): Payer: Self-pay | Admitting: *Deleted

## 2013-02-07 MED ORDER — HYDROCODONE-ACETAMINOPHEN 5-325 MG PO TABS: 1.0000 | ORAL_TABLET | ORAL | Status: DC | PRN

## 2013-02-07 MED ORDER — CIPROFLOXACIN HCL 500 MG PO TABS: 500.0000 mg | ORAL_TABLET | Freq: Two times a day (BID) | ORAL | Status: DC

## 2013-02-07 MED ORDER — METRONIDAZOLE 500 MG PO TABS: 500.0000 mg | ORAL_TABLET | Freq: Three times a day (TID) | ORAL | Status: DC

## 2013-02-07 NOTE — Discharge Summary (Signed)
ATTENDING ADDENDUM:  I personally reviewed patient's record, examined the patient, and formulated the following assessment and plan:  Drain with bloody output.  Stable for d/c home.  Will f/u with Donne Hazel in the office.

## 2013-02-07 NOTE — Progress Notes (Signed)
Pt for d/c today with [facility] as ordered. Dressing CDI to abdominal incision. Flushing instructions given to pt for drain. Pt will d/c with abscess drain bag to R side of the abdomen. Discharge intructions given to pt & family with verbalized understanding. Son at bedside to assist with d/c.

## 2013-02-07 NOTE — Telephone Encounter (Signed)
Patient called asking about changing her appt to earlier since she has been inpatient.  Spoke to Dr. Donne Hazel who stated patient's current appt on 02/18/13 is appropriate and just to have patient keep that appt.  Patient updated at this time and agreeable with plan.

## 2013-02-07 NOTE — Care Management Note (Signed)
    Page 1 of 1   02/07/2013     10:24:59 AM   CARE MANAGEMENT NOTE 02/07/2013  Patient:  Teresa [name],Teresa [name]   Account Number:  [id number]  Date Initiated:  02/06/2013  Documentation initiated by:  Dessa Phi  Subjective/Objective Assessment:   72 y/o f admitted w/abd wall infection.     Action/Plan:   From home.   Anticipated DC Date:  02/09/2013   Anticipated DC Plan:  [place]  CM consult      [facility] Choice  HOME HEALTH   Choice offered to / List presented to:  C-1 Patient        [place] arranged  HH-1 RN      [place].   Status of service:  Completed, signed off Medicare Important Message given?   (If response is "NO", the following Medicare IM given date fields will be blank) Date Medicare IM given:   Date Additional Medicare IM given:    Discharge Disposition:  [place]  Per UR Regulation:  Reviewed for med. necessity/level of care/duration of stay  If discussed at [place] of Stay Meetings, dates discussed:    Comments:

## 2013-02-07 NOTE — Discharge Summary (Signed)
Physician Discharge Summary  Patient ID: Teresa [name] MRN: [id number] DOB/AGE: [birth year] 72 y.o.  Admit date: 02/04/2013 Discharge date: 02/07/2013  Admitting Diagnosis: RLQ abdominal abscess H/o Abdominal wall infections Fever Leukocytosis  Discharge Diagnosis Patient Active Problem List   Diagnosis Date Noted  . Intra-abdominal abscess 02/04/2013  . Abdominal wall skin ulcer 01/29/2012  . Ileus following gastrointestinal [name] 05/01/2011  . Hypothyroid 05/01/2011  . Dyslipidemia 05/01/2011  . GERD (gastroesophageal reflux disease) 05/01/2011  . Osteoarthritis 05/01/2011  . Cardiomyopathy 05/01/2011  . Mitral regurgitation 05/01/2011  . Tricuspid regurgitation 05/01/2011  . Chronic renal insufficiency 05/01/2011  . SBO (small bowel obstruction), s/p expl lap 04/24/2011  . Chest pain 10/18/2010  . Nonischemic cardiomyopathy   . HTN (hypertension)   . Tobacco abuse     Consultants None  Imaging: No results found.  Procedures Dr. Jarvis Newcomer (02/05/13) - Perc drain intra-abdominal abscess, CT guided abscess drainage  [name] Course:  72 year old female with an extensive surgical history over the last couple of years. About 2 years ago she underwent laparotomy and small bo4wel resection for small bowel obstruction. She had abdominal mesh [name] place from previous hernia repairs. She apparently developed infection related to this and has had initially a large open abdominal wound postoperatively and subsequently some recurrent abdominal wounds that have required debridement and closure on several occasions. She has been cared for by Dr. Donne Hazel and Dr. Migdalia Dk. She last had a wound debridement about 6 weeks ago. For about the past 2 weeks she has had gradually worsening symptoms consisting mainly of fatigue and malaise and also right sided flank or abdominal pain is made worse with any movement. More recently she has had some fever and chills as well. She has noted some shortness  of breath only on exertion. She presented to Dr. Leafy Ro office today for routine followup of a residual small midline abdominal wound [name] the lower abdomen and complained of these symptoms and appeared ill. She was evaluated then by Dr. Edilia Bo who obtained a CT scan showing a right sided abdominal abscess as described below on the CT scan. The patient states that about 6 months ago she was having some similar right-sided pain not as severe. At that time she developed spontaneous opening of her midline incision with drainage and the right-sided abdominal pain resolved.  Workup showed an abdominal wall infection with RUQ abscess, fever, pain, and leukocytosis.  Patient was admitted and underwent procedure listed above.  Tolerated procedure well and was transferred to the floor.  Diet was advanced as tolerated.  On HD #4, the patient was voiding well, tolerating diet, ambulating well, pain well controlled, vital signs stable, drain c/d and felt stable for discharge home.  Patient will follow up [name] our office on 02/09/13 with Dr. Donne Hazel for drain check, and knows to call with questions or concerns.  She will go home with [facility] nursing for drain management and midline wound check.  Physical Exam: General:  Alert, NAD, pleasant, comfortable Abd:  Soft, ND, mild tenderness around drain site, drain site clean, midline wound draining , drain with serosanguinous drainage    Medication List         ALPRAZolam 0.25 MG tablet  Commonly known as:  XANAX  Take 1 tablet (0.25 mg total) by mouth 3 (three) times daily as needed for anxiety.     carvedilol 12.5 MG tablet  Commonly known as:  COREG  Take 1 tablet (12.5 mg total) by mouth 2 (two) times daily with  a meal.     ciprofloxacin 500 MG tablet  Commonly known as:  CIPRO  Take 1 tablet (500 mg total) by mouth 2 (two) times daily.     doxylamine (Sleep) 25 MG tablet  Commonly known as:  UNISOM  Take 25 mg by mouth at bedtime as needed for sleep.      furosemide 40 MG tablet  Commonly known as:  LASIX  Take 1 tablet (40 mg total) by mouth daily.     HYDROcodone-acetaminophen 5-325 MG per tablet  Commonly known as:  NORCO/VICODIN  Take 1-2 tablets by mouth every 4 (four) hours as needed.     levothyroxine 88 MCG tablet  Commonly known as:  SYNTHROID, LEVOTHROID  Take 1 tablet (88 mcg total) by mouth daily.     losartan 25 MG tablet  Commonly known as:  COZAAR  TAKE 1 TABLET DAILY.     metroNIDAZOLE 500 MG tablet  Commonly known as:  FLAGYL  Take 1 tablet (500 mg total) by mouth 3 (three) times daily.     spironolactone 25 MG tablet  Commonly known as:  ALDACTONE  Take 12.5 mg by mouth at bedtime.             Follow-up Information   Follow up with [name], Teresa [name] 5 days. (Follow up by the end of this week or eary next week)    Specialty:  General [name]   Contact information:   [address] [place] [address] [phone]       Signed: Excell Seltzer [name] [phone]  02/07/2013, 9:47 AM

## 2013-02-08 LAB — CULTURE, ROUTINE-ABSCESS: Special Requests: NORMAL

## 2013-02-09 ENCOUNTER — Encounter: Payer: Self-pay | Admitting: Family Medicine

## 2013-02-09 ENCOUNTER — Telehealth: Payer: Self-pay

## 2013-02-09 NOTE — Telephone Encounter (Signed)
Patient had a surgical procedure the other week, is now having a yeast infection and it is affecting other areas. She is concerned. Please call at [phone].

## 2013-02-09 NOTE — Telephone Encounter (Signed)
Please advise this patient to contact her surgeon at [facility] (Dr. Donne Hazel). They will address any complications resulting from the procedure they performed. I see that she contacted [facility] several days ago, inquiring about moving up her follow-up visit (02/18/2013) and was advised that the current appointment is appropriate.  However, it doesn't appear that she told them she was having a problem.

## 2013-02-10 ENCOUNTER — Telehealth (INDEPENDENT_AMBULATORY_CARE_PROVIDER_SITE_OTHER): Payer: Self-pay

## 2013-02-10 NOTE — Telephone Encounter (Signed)
Alisha, Could you call her and ask her what is going on?

## 2013-02-10 NOTE — Telephone Encounter (Signed)
Pt CB and I explained that Dr Cristal Generous office is aware that she is trying to get in touch with them and should be calling. Pt reported that the monistat seems to be helping her yeast infection, but will expect call from surgeon.

## 2013-02-10 NOTE — Telephone Encounter (Signed)
Pt called back and states her abd wound is doing fine but she has developed a vaginal yeast infection from the abx.  She asked if it's ok that she is using Monistat cream because it is helping.  I told her this is fine.  She has no fever.  She has follow up next week with Dr Donne Hazel.

## 2013-02-10 NOTE — Telephone Encounter (Signed)
Opened in error

## 2013-02-10 NOTE — Telephone Encounter (Signed)
Thanks, I have forwarded message to the surgeon and instructed patient to call the office as well.

## 2013-02-11 NOTE — Telephone Encounter (Signed)
That is fine.  If worse i can give her diflucan

## 2013-02-17 ENCOUNTER — Telehealth: Payer: Self-pay

## 2013-02-17 ENCOUNTER — Other Ambulatory Visit: Payer: Self-pay

## 2013-02-17 DIAGNOSIS — F411 Generalized anxiety disorder: Secondary | ICD-10-CM

## 2013-02-17 MED ORDER — LOSARTAN POTASSIUM 25 MG PO TABS: ORAL_TABLET | ORAL | Status: DC

## 2013-02-17 NOTE — Telephone Encounter (Signed)
Pt is calling because she will run out of her xanax next week and she wants to go ahead an get a refill or know if she needs to come in the office to be seen Call back number [phone]

## 2013-02-18 ENCOUNTER — Ambulatory Visit (INDEPENDENT_AMBULATORY_CARE_PROVIDER_SITE_OTHER): Payer: Medicare Other | Admitting: General Surgery

## 2013-02-18 ENCOUNTER — Encounter (INDEPENDENT_AMBULATORY_CARE_PROVIDER_SITE_OTHER): Payer: Self-pay | Admitting: General Surgery

## 2013-02-18 VITALS — BP 138/76 | HR 65 | Temp 98.0°F | Resp 18 | Ht 64.0 in | Wt 166.0 lb

## 2013-02-18 DIAGNOSIS — K651 Peritoneal abscess: Secondary | ICD-10-CM

## 2013-02-18 MED ORDER — ALPRAZOLAM 0.25 MG PO TABS: 0.2500 mg | ORAL_TABLET | Freq: Three times a day (TID) | ORAL | Status: DC | PRN

## 2013-02-18 NOTE — Progress Notes (Signed)
Subjective:     Patient ID: Teresa [name], female   DOB: 05/14/1941, 72 y.o.   MRN: [id number]  HPI 29 yof history well documented in prior notes who I have removed mesh from abdominal wall for open wounds.  Continued to drain from one of these. She had acute illness which showed enlarging ruq fluid collection and had this drained.  She returns today feeling well.  Drain really not putting anything out now and the one remaining wound on her abdomen has healed.  She denies fevers or any other problems.  Review of Systems     Objective:   Physical Exam Drain in place with minimal serous fluid present, abdomen nontender with healed anterior abdominal wounds without drainage    Assessment:     ruq abscess Healed abdominal wall wounds     Plan:     I removed her drain today.  We discussed obtaining ct prior but she did not want to do this and I think won't change anything right now due to clinical status either.  She is going to monitor wounds and I will see her back in four weeks.  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

## 2013-02-18 NOTE — Telephone Encounter (Signed)
Alprazolam refill phoned to pt's pharmacy; pharmacy to notify pt that she needs to sch appt. I last saw this pt 1 year ago.

## 2013-03-21 ENCOUNTER — Ambulatory Visit (INDEPENDENT_AMBULATORY_CARE_PROVIDER_SITE_OTHER): Payer: Medicare Other | Admitting: General Surgery

## 2013-03-21 ENCOUNTER — Encounter (INDEPENDENT_AMBULATORY_CARE_PROVIDER_SITE_OTHER): Payer: Self-pay | Admitting: General Surgery

## 2013-03-21 VITALS — BP 114/66 | HR 76 | Temp 98.7°F | Resp 14 | Ht 63.0 in | Wt 170.0 lb

## 2013-03-21 DIAGNOSIS — L98491 Non-pressure chronic ulcer of skin of other sites limited to breakdown of skin: Secondary | ICD-10-CM

## 2013-03-21 DIAGNOSIS — L98499 Non-pressure chronic ulcer of skin of other sites with unspecified severity: Secondary | ICD-10-CM

## 2013-03-21 NOTE — Progress Notes (Signed)
Subjective:     Patient ID: Teresa [name], female   DOB: April 14, 1941, 72 y.o.   MRN: [id number]  HPI This is a 72 year old female who has had a long-standing history of open abdominal wounds after her surgery done by one of my partners. I debrided her abdominal wall. It looked like this was  going to heal and then popped back open were treated conservatively. Then she ended up having a right upper quadrant abscess. She previously had some fluid above her liver which was not draining and she clinically did not appear to have an abscess and I did not think this was connected to these areas in her lower abdominal wall. This area was drained. I removed her drain last week. Since then she has done well and returns today without any complaints. She is she feels better than she has a couple of years right now. The wounds have all healed over and she is without complaint.  Review of Systems     Objective:   Physical Exam Abdomen with large ventral hernia, wounds all healed    Assessment:     S/p ruq abscess drainage Abdominal wounds healed     Plan:     She is well now.  Everything has healed and she is back to normal.  I asked her to come back and see me as needed.

## 2013-03-23 ENCOUNTER — Other Ambulatory Visit: Payer: Self-pay | Admitting: Family Medicine

## 2013-03-25 ENCOUNTER — Other Ambulatory Visit: Payer: Self-pay | Admitting: Pain Medicine

## 2013-03-25 ENCOUNTER — Ambulatory Visit
Admission: RE | Admit: 2013-03-25 | Discharge: 2013-03-25 | Disposition: A | Discharge status: Home / Self Care | Payer: Medicare Other | Source: Ambulatory Visit | Attending: Pain Medicine | Admitting: Pain Medicine

## 2013-03-25 DIAGNOSIS — M25551 Pain in right hip: Secondary | ICD-10-CM

## 2013-03-25 DIAGNOSIS — M25552 Pain in left hip: Secondary | ICD-10-CM

## 2013-03-26 ENCOUNTER — Ambulatory Visit (INDEPENDENT_AMBULATORY_CARE_PROVIDER_SITE_OTHER): Payer: Medicare Other | Admitting: Family Medicine

## 2013-03-26 VITALS — BP 120/74 | HR 92 | Temp 98.1°F | Resp 18 | Ht 63.0 in | Wt 170.2 lb

## 2013-03-26 DIAGNOSIS — F411 Generalized anxiety disorder: Secondary | ICD-10-CM

## 2013-03-26 MED ORDER — ALPRAZOLAM 0.25 MG PO TABS: 0.2500 mg | ORAL_TABLET | Freq: Three times a day (TID) | ORAL | Status: DC | PRN

## 2013-03-26 NOTE — Patient Instructions (Signed)
Same dose of xanax for now, but think about therapist or daily med as we discussed.  You can discuss this further with Dr. Tamala Julian, but follow up needed with her in next 1 month either as walk in or by appointment. Let me know if you would like names sooner.  Return to the clinic or go to the nearest emergency room if any of your symptoms worsen or new symptoms occur.

## 2013-03-26 NOTE — Progress Notes (Signed)
Subjective:    Patient ID: Teresa [name], female    DOB: 1940/10/26, 72 y.o.   MRN: [id number]  HPI Teresa [name] is a 72 y.o. female  PCP: Dr. Leward Quan prior, but per pt had discussed with Dr. Tamala Julian at last ov that Dr. Tamala Julian will take her on as PCP.  Plans on scheduling this today.   Complicated medical history with nonhealing abdominal wound, and abdominal abscess.  Healed now. Feels better about this.    Insomnia - seen by Dr. Tamala Julian in March of this year for multiple med refills. At that time: xanax 1-2 at bedtime for insomnia; takes one in afternoon if needed for anxiety.Increased anxiety then surrounding multiple surgeries, Husband died 2years, son had a CVA. No other medication for anxiety.  previously took Zoloft years ago for depression. multiple prior providers (Dr. Pleas Koch, then Tawni Millers, and Dr. Leward Quan last September. Alprazolam #90 refilled 02/17/13 per phone notes, but also advised to schedule appt.   Takes xanax for sleep and anxiety/panic attacks.  Takes xanax 3 times per day. Taking same dose for years, no recent increases or change in doses. Denies sedation with this, no dizziness/lightheadedness, no MVC's. Denies side effects of this medicine. Worry.  Denies depression, No current therapist/counselor/support group. Feels like nothing to talk about. Son with chronic illness - heart issues, with EF about 10-15%.  Hard to adjust to living without husband who died 2 years ago.    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  Has good support system with friends, including one that has gone to medical appts with her.   Review of Systems  Psychiatric/Behavioral: Positive for sleep disturbance. Negative for suicidal ideas, self-injury and dysphoric mood. The patient is nervous/anxious.        Objective:   Physical Exam  Vitals reviewed. Constitutional: She is oriented to person, place, and time. She appears well-developed and well-nourished. No  distress.  HENT:  Head: Normocephalic and atraumatic.  Neck:  Healed scar.   Cardiovascular: Normal rate, regular rhythm, normal heart sounds and intact distal pulses.   Pulmonary/Chest: Effort normal.  Neurological: She is alert and oriented to person, place, and time.  Skin: Skin is warm and dry. No rash noted.  Psychiatric: She has a normal mood and affect. Her behavior is normal.      Assessment & Plan:  Teresa [name] is a 72 y.o. female Generalized anxiety disorder - Plan: ALPRAZolam (XANAX) 0.25 MG tablet stable dosing for years, discussed/counseeld in office x 33mins about CBT, therapy or support group for social stressors and grief from husband's death 2 years ago, and SSRi benefit with daily worry/GAD. As stable dose of xanax, and no side effects, refilled today, but will need appt or OV in next month for refill. She plans to see Dr. Tamala Julian in next 1 month as planned. rtc precautions.   Meds ordered this encounter  Medications  . ALPRAZolam (XANAX) 0.25 MG tablet    Sig: Take 1 tablet (0.25 mg total) by mouth 3 (three) times daily as needed for anxiety.    Dispense:  90 tablet    Refill:  0    Office visit needed for additional refills.  phoned in to [facility].   Patient Instructions  Same dose of xanax for now, but think about therapist or daily med as we discussed.  You can discuss this further with Dr. Tamala Julian, but follow up needed with her in next 1 month either as walk in  or by appointment. Let me know if you would like names sooner.  Return to the clinic or go to the nearest emergency room if any of your symptoms worsen or new symptoms occur.

## 2013-04-20 ENCOUNTER — Encounter: Payer: Self-pay | Admitting: Family Medicine

## 2013-04-20 ENCOUNTER — Telehealth: Payer: Self-pay | Admitting: *Deleted

## 2013-04-20 ENCOUNTER — Ambulatory Visit (INDEPENDENT_AMBULATORY_CARE_PROVIDER_SITE_OTHER): Payer: Medicare Other | Admitting: Family Medicine

## 2013-04-20 VITALS — BP 130/70 | HR 73 | Temp 97.8°F | Resp 16 | Ht 63.0 in | Wt 169.0 lb

## 2013-04-20 DIAGNOSIS — K651 Peritoneal abscess: Secondary | ICD-10-CM

## 2013-04-20 DIAGNOSIS — F411 Generalized anxiety disorder: Secondary | ICD-10-CM

## 2013-04-20 DIAGNOSIS — E039 Hypothyroidism, unspecified: Secondary | ICD-10-CM

## 2013-04-20 DIAGNOSIS — R7309 Other abnormal glucose: Secondary | ICD-10-CM

## 2013-04-20 DIAGNOSIS — E7800 Pure hypercholesterolemia, unspecified: Secondary | ICD-10-CM

## 2013-04-20 DIAGNOSIS — I10 Essential (primary) hypertension: Secondary | ICD-10-CM

## 2013-04-20 DIAGNOSIS — E785 Hyperlipidemia, unspecified: Secondary | ICD-10-CM

## 2013-04-20 DIAGNOSIS — Z23 Encounter for immunization: Secondary | ICD-10-CM

## 2013-04-20 LAB — COMPREHENSIVE METABOLIC PANEL
ALT: 13 U/L (ref 0–35)
AST: 15 U/L (ref 0–37)
Albumin: 4.1 g/dL (ref 3.5–5.2)
BUN: 30 mg/dL — ABNORMAL HIGH (ref 6–23)
CO2: 24 mEq/L (ref 19–32)
Calcium: 9.3 mg/dL (ref 8.4–10.5)
Chloride: 107 mEq/L (ref 96–112)
Creat: 1.02 mg/dL (ref 0.50–1.10)
Potassium: 4.6 mEq/L (ref 3.5–5.3)
Sodium: 137 mEq/L (ref 135–145)

## 2013-04-20 LAB — CBC WITH DIFFERENTIAL/PLATELET
HCT: 37.6 % (ref 36.0–46.0)
Hemoglobin: 12.7 g/dL (ref 12.0–15.0)
Lymphs Abs: 2.1 10*3/uL (ref 0.7–4.0)
MCH: 30.8 pg (ref 26.0–34.0)
MCHC: 33.8 g/dL (ref 30.0–36.0)
Monocytes Absolute: 0.5 10*3/uL (ref 0.1–1.0)
Monocytes Relative: 8 % (ref 3–12)
Neutro Abs: 3.2 10*3/uL (ref 1.7–7.7)
Neutrophils Relative %: 53 % (ref 43–77)
RBC: 4.12 MIL/uL (ref 3.87–5.11)

## 2013-04-20 LAB — LIPID PANEL
Cholesterol: 201 mg/dL — ABNORMAL HIGH (ref 0–200)
LDL Cholesterol: 142 mg/dL — ABNORMAL HIGH (ref 0–99)
Triglycerides: 145 mg/dL (ref <150)
VLDL: 29 mg/dL (ref 0–40)

## 2013-04-20 LAB — TSH: TSH: 0.625 u[IU]/mL (ref 0.350–4.500)

## 2013-04-20 LAB — HEMOGLOBIN A1C: Hgb A1c MFr Bld: 5.6 % (ref <5.7)

## 2013-04-20 LAB — T4, FREE: Free T4: 1.15 ng/dL (ref 0.80–1.80)

## 2013-04-20 MED ORDER — LEVOTHYROXINE SODIUM 88 MCG PO TABS: 88.0000 ug | ORAL_TABLET | Freq: Every day | ORAL | Status: DC

## 2013-04-20 MED ORDER — ALPRAZOLAM 0.25 MG PO TABS: 0.2500 mg | ORAL_TABLET | Freq: Three times a day (TID) | ORAL | Status: DC | PRN

## 2013-04-20 NOTE — Progress Notes (Signed)
[place], [place]  [id number]   [phone]  Subjective:    Patient ID: Teresa [name], female    DOB: [birth year], 72 y.o.   MRN: [id number] This chart was scribed for Reginia Forts, MD by Anastasia Pall, ED Scribe. This patient was seen in room 21 and the patient's care was started at 8:28 AM.  HPI Teresa [name] is a 72 y.o. female with a h/o high blood pressure, high cholesterol, hypothyroidism, and who presents to the [facility] for a follow up.   She reports that her abscess wound has healed up. She states that for 2 years she had a wound that would not heal, which sapped away all her energy. She reports having the wound closed up twice before, unsuccessfully, and was getting exasperated. She reports being upbeat about being able to work again, and not have to change bandages every day. She reports she has been released from Dr. Donne Hazel. She reports having been seen by Dr. Migdalia Dk, a plastic surgeon, who did the work on her wound, and is very pleased with the results.   She states she is here today to get a Rx refill for Alprazolam. She is willing to make an appointment for an annual exam.  She states she has taken her BP medicine today. She denies checking her BP or sugar levels at home. She reports taking her other medications regularly, including Alprazolam and Hydrocodone. She states she has been going to pain management, and states they have been looking after her. She reports doing okay with her anxiety. She reports a lot of the anxiety now is from getting older, and from her son's health. She reports sleeping well, and denies nightmares.   She reports having a recent echocardiogram. She denies having her flu vaccination. She reports h/o goiter, but denies cancer. She states they only took part of her thyroid out.   She states she is the oldest of her siblings, and they all passed away young.   She reports that she has not eaten today. She reports regaining weight recently due  to her improved health.   She denies any chest pain, palpitations, SOB, LE swelling, bladder or bowel problems, thyroid complications, and any other associated symptoms.  PCP [name], MD   Patient Active Problem List   Diagnosis Date Noted  . Intra-abdominal abscess 02/04/2013  . Abdominal wall skin ulcer 01/29/2012  . Ileus following gastrointestinal surgery 05/01/2011  . Hypothyroid 05/01/2011  . Dyslipidemia 05/01/2011  . GERD (gastroesophageal reflux disease) 05/01/2011  . Osteoarthritis 05/01/2011  . Cardiomyopathy 05/01/2011  . Mitral regurgitation 05/01/2011  . Tricuspid regurgitation 05/01/2011  . Chronic renal insufficiency 05/01/2011  . SBO (small bowel obstruction), s/p expl lap 04/24/2011  . Chest pain 10/18/2010  . Nonischemic cardiomyopathy   . HTN (hypertension)   . Tobacco abuse    Past Medical History  Diagnosis Date  . Nonischemic cardiomyopathy DR [facility]     EF is 25% per echo February 2012, non ischemic myoview 12/2009.  EF 50% echo 7/12 and plans for ICD cancelled.   Marland Kitchen HTN (hypertension)   . PVC (premature ventricular contraction)   . Valvular heart disease     Moderate to severe MR, moderate TR, LAE per echo 7/11  . Spinal stenosis   . Abdominal pain   . History of glaucoma   . Non-healing surgical wound     ABDOMINAL S/P EXPLORATOY LAPAROTOMY 04-18-2011  . History of small bowel obstruction 2009  AND NOV 2012  . Peptic ulcer disease   . GERD (gastroesophageal reflux disease)   . Chronic systolic CHF (congestive heart failure), NYHA class 2     EF 25% 2011 MV,  50-55% echo 6/13  . Moderate mitral regurgitation   . Moderate tricuspid regurgitation   . Arthritis     FEET, KNEES, HANDS  . H/O hiatal hernia   . Urge urinary incontinence   . Left ovarian cyst   . Diabetes mellitus type 2, diet-controlled   . Shortness of breath   . Wears dentures     upper denture-lower partial  . Hypothyroidism    Past Surgical History  Procedure  Laterality Date  . Tubal ligation    . Glaucoma surgery  1978  . Exploratomy lap. / extensive lysis adhesions/ small bowel resection x2 with primary anastomosis x2  04-18-2011    SMALL BOWEL OBSTRUCTION  . Knee arthroscopy w/ meniscectomy  06-05-2004  . Transthoracic echocardiogram  12-27-2010    EF 45-50%/ MODERATE MV REGURG. / LEFT ATRIUM MILDLY DILATED/ MILD TRICUSPID REGURG.  . Cardiovascular stress test  JULY 2011-  DR HOCHREIN    NO ISCHEMIA  . Thyroidectomy  1978 GOITOR    AND CERVICAL COLD KNIFE CONE BX  . Tonsillectomy  AGE 24  . Benign right breast tumor removed    . Appendectomy  1979    RUPTURED  . Abdominal hernia repair  18 YRS AGO    PERITINITIS  . Ventral hernia repair  X4  LAST ONE 20 YRS AGO    ABDOMINAL --  EACH ONE IN DIFFERENT AREA  . Wound debridement  09/25/2011    Procedure: DEBRIDEMENT CLOSURE/ABDOMINAL WOUND;  Surgeon: Theodoro Kos, DO;  Location: [facility];  Service: Plastics;  Laterality: N/A;  excision of abdominal wound with primary closure  . Ventral hernia repair  01/29/2012    Procedure: HERNIA REPAIR VENTRAL ADULT;  Surgeon: Theodoro Kos, DO;  Location: [facility];  Service: Plastics;  Laterality: N/A;  . Hernia repair    . Incision and drainage of wound N/A 09/13/2012    Procedure: INCISION  AND DEBRIDEMENT WOUND abdominal wall ;  Surgeon: Rolm Bookbinder, MD;  Location: [facility];  Service: General;  Laterality: N/A;  coordination with Dr Migdalia Dk    Allergies  Allergen Reactions  . Aspirin Other (See Comments)    HX Bleeding Ulcer   . Nsaids     Hx of bleeding ulcers  . Tolmetin Rash    Hx of bleeding ulcers   Prior to Admission medications   Medication Sig Start Date End Date Taking? Authorizing Provider  ALPRAZolam (XANAX) 0.25 MG tablet Take 1 tablet (0.25 mg total) by mouth 3 (three) times daily as needed for anxiety. 03/26/13  Yes Wendie Agreste, MD  carvedilol (COREG) 12.5 MG tablet Take 1 tablet (12.5 mg  total) by mouth 2 (two) times daily with a meal. 01/11/13  Yes Burtis Junes, NP  doxylamine, Sleep, (UNISOM) 25 MG tablet Take 25 mg by mouth at bedtime as needed for sleep.   Yes Historical Provider, MD  furosemide (LASIX) 40 MG tablet Take 1 tablet (40 mg total) by mouth daily. 01/17/13  Yes Minus Breeding, MD  levothyroxine (SYNTHROID, LEVOTHROID) 88 MCG tablet Take 1 tablet (88 mcg total) by mouth daily before breakfast. PATIENT NEEDS OFFICE VISIT FOR ADDITIONAL REFILLS 03/23/13  Yes Wardell Honour, MD  losartan (COZAAR) 25 MG tablet TAKE 1 TABLET DAILY. 02/17/13  Yes  Burtis Junes, NP  spironolactone (ALDACTONE) 25 MG tablet Take 12.5 mg by mouth at bedtime.   Yes Historical Provider, MD  ciprofloxacin (CIPRO) 500 MG tablet Take 1 tablet (500 mg total) by mouth 2 (two) times daily. 02/07/13   Megan Dort, PA-C  HYDROcodone-acetaminophen (NORCO/VICODIN) 5-325 MG per tablet Take 1-2 tablets by mouth every 4 (four) hours as needed. 02/07/13   Coralie Keens, PA-C    Review of Systems  Constitutional: Negative for diaphoresis and fatigue.  Respiratory: Negative for shortness of breath.   Cardiovascular: Negative for chest pain, palpitations and leg swelling.  Gastrointestinal: Negative.  Negative for abdominal pain, diarrhea and constipation.  Endocrine: Negative.   Genitourinary: Negative.   Musculoskeletal: Positive for arthralgias and myalgias.  Psychiatric/Behavioral: Negative for sleep disturbance and dysphoric mood. The patient is not nervous/anxious.        Objective:   Physical Exam  Nursing note and vitals reviewed. Constitutional: She is oriented to person, place, and time. She appears well-developed and well-nourished. No distress.  HENT:  Head: Normocephalic and atraumatic.  Eyes: Conjunctivae and EOM are normal. Pupils are equal, round, and reactive to light.  Neck: Normal range of motion. Neck supple. No tracheal deviation present. No thyromegaly present.  Well-healed incision  anterior neck.  Cardiovascular: Normal rate, regular rhythm and normal heart sounds.  Exam reveals no gallop and no friction rub.   No murmur heard. Pulmonary/Chest: Effort normal and breath sounds normal. No respiratory distress. She has no wheezes. She has no rales.  Abdominal: Soft. Bowel sounds are normal. She exhibits no distension. There is no tenderness. There is no rebound.  Musculoskeletal: Normal range of motion.  Neurological: She is alert and oriented to person, place, and time.  Skin: Skin is warm and dry.  Psychiatric: She has a normal mood and affect. Her behavior is normal.    Triage Vitals: BP 130/70  Pulse 73  Temp(Src) 97.8 F (36.6 C) (Oral)  Resp 16  Ht 5\' 3"  (1.6 m)  Wt 169 lb (76.658 kg)  BMI 29.94 kg/m2  SpO2 98%     Assessment & Plan:  Generalized anxiety disorder - Plan: ALPRAZolam (XANAX) 0.25 MG tablet, Comprehensive metabolic panel  Hypothyroid - Plan: TSH, T4, Free  HTN (hypertension) - Plan: CBC with Differential, Comprehensive metabolic panel, Lipid Panel  Other abnormal glucose - Plan: Hemoglobin A1c  Need for prophylactic vaccination and inoculation against influenza - Plan: Flu Vaccine QUAD 36+ mos IM  Dyslipidemia  Intra-abdominal abscess  1.  Generalized anxiety disorder: controlled; refill of Xanax tid.  Has been maintained on current dose of Xanax for years and doing very well; reluctant to start SSRI at this time; advised that if clinically declined, would warrant addition of SSRI as opposed to increasing Xanax dose; pt agreeable. 2.  Hypothyroidism: stable; obtain labs; refill provided. 3.  HTN: controlled; obtain labs; continue current regimen. 4.  Glucose Intolerance:  Stable; obtain labs; recent weight gain thus sugars may be elevated. 5.  Dyslipidemia: stable; recent weight gain as health has improved; obtain labs; dietary modification advised. 6.  Intra-abdominal abscess: Improved; s/p drainage in 01/2013; clinically improved and  no longer suffering with abdominal wounds.  Recent clearance from surgeon.  7.  S/p flu vaccine. 8. Chronic pain syndrome:  Stable; followed by the pain clinic; usually taking Hydrocodone tid.  Meds ordered this encounter  Medications  . ALPRAZolam (XANAX) 0.25 MG tablet    Sig: Take 1 tablet (0.25 mg total) by mouth 3 (  three) times daily as needed for anxiety.    Dispense:  90 tablet    Refill:  5    Office visit needed for additional refills.  Marland Kitchen levothyroxine (SYNTHROID, LEVOTHROID) 88 MCG tablet    Sig: Take 1 tablet (88 mcg total) by mouth daily before breakfast.    Dispense:  30 tablet    Refill:  11    I personally performed the services described in this documentation, which was scribed in my presence.  The recorded information has been reviewed and is accurate.

## 2013-04-20 NOTE — Telephone Encounter (Signed)
Faxed prescription for Xanax 0.25 mg to pharmacy [phone]) per Dr Tamala Julian. Confirmation page received at 8:59 am

## 2013-09-12 ENCOUNTER — Ambulatory Visit (INDEPENDENT_AMBULATORY_CARE_PROVIDER_SITE_OTHER): Payer: Medicare Other | Admitting: Family Medicine

## 2013-09-12 ENCOUNTER — Ambulatory Visit: Payer: Self-pay | Admitting: Family Medicine

## 2013-09-12 ENCOUNTER — Encounter: Payer: Self-pay | Admitting: Family Medicine

## 2013-09-12 VITALS — BP 138/82 | HR 84 | Temp 97.8°F | Resp 16 | Ht 62.75 in | Wt 177.0 lb

## 2013-09-12 DIAGNOSIS — I10 Essential (primary) hypertension: Secondary | ICD-10-CM

## 2013-09-12 DIAGNOSIS — H6120 Impacted cerumen, unspecified ear: Secondary | ICD-10-CM

## 2013-09-12 DIAGNOSIS — K651 Peritoneal abscess: Secondary | ICD-10-CM

## 2013-09-12 DIAGNOSIS — F411 Generalized anxiety disorder: Secondary | ICD-10-CM

## 2013-09-12 DIAGNOSIS — I428 Other cardiomyopathies: Secondary | ICD-10-CM

## 2013-09-12 LAB — COMPLETE METABOLIC PANEL WITH GFR
ALK PHOS: 90 U/L (ref 39–117)
ALT: 17 U/L (ref 0–35)
AST: 18 U/L (ref 0–37)
Albumin: 4.2 g/dL (ref 3.5–5.2)
BILIRUBIN TOTAL: 0.3 mg/dL (ref 0.2–1.2)
BUN: 23 mg/dL (ref 6–23)
CO2: 20 mEq/L (ref 19–32)
CREATININE: 0.81 mg/dL (ref 0.50–1.10)
Calcium: 9 mg/dL (ref 8.4–10.5)
Chloride: 109 mEq/L (ref 96–112)
GFR, EST NON AFRICAN AMERICAN: 73 mL/min
GFR, Est African American: 84 mL/min
GLUCOSE: 75 mg/dL (ref 70–99)
Potassium: 4.6 mEq/L (ref 3.5–5.3)
Sodium: 138 mEq/L (ref 135–145)
Total Protein: 6.5 g/dL (ref 6.0–8.3)

## 2013-09-12 LAB — CBC WITH DIFFERENTIAL/PLATELET
Basophils Absolute: 0.1 10*3/uL (ref 0.0–0.1)
Basophils Relative: 1 % (ref 0–1)
EOS ABS: 0.1 10*3/uL (ref 0.0–0.7)
EOS PCT: 2 % (ref 0–5)
HCT: 37.1 % (ref 36.0–46.0)
Hemoglobin: 12.5 g/dL (ref 12.0–15.0)
Lymphocytes Relative: 38 % (ref 12–46)
Lymphs Abs: 2.7 10*3/uL (ref 0.7–4.0)
MCH: 32.1 pg (ref 26.0–34.0)
MCHC: 33.7 g/dL (ref 30.0–36.0)
MCV: 95.1 fL (ref 78.0–100.0)
MONO ABS: 0.5 10*3/uL (ref 0.1–1.0)
MONOS PCT: 7 % (ref 3–12)
Neutro Abs: 3.7 10*3/uL (ref 1.7–7.7)
Neutrophils Relative %: 52 % (ref 43–77)
Platelets: 203 10*3/uL (ref 150–400)
RBC: 3.9 MIL/uL (ref 3.87–5.11)
RDW: 13.3 % (ref 11.5–15.5)
WBC: 7.2 10*3/uL (ref 4.0–10.5)

## 2013-09-12 MED ORDER — ALPRAZOLAM 0.25 MG PO TABS: 0.2500 mg | ORAL_TABLET | Freq: Three times a day (TID) | ORAL | Status: DC | PRN

## 2013-09-12 NOTE — Progress Notes (Signed)
Subjective:    Patient ID: Teresa [name], female    DOB: 06-23-1940, 73 y.o.   MRN: [id number]  09/12/2013  Medication Refill, Hypertension and Anxiety   HPI This 73 y.o. female presents for four month follow-up:  1.  Liver fluid collectionAbscess: s/p drainage of fluid in 02/2013; no further drainage from umbilicus since procedure.  Energy level is much improved.  Abscess had been causing severe fatigue.  Plastic surgeon had been managing abdominal wound; admitted to hospital from [facility]; underwent surgery on Saturday morning; released on Monday; worked on Tuesday.Energy level is much better.  Has huge abdominal hernia; has intestinal problems.  Has loose stools.  Unable to repair hernia; abdominal muscles are retracted from previous surgery and secondary intention.  S/p followed by the [facility].    2.  HTN:  Does not check BP at home. Patient reports good compliance with medication, good tolerance to medication, and good symptom control.  Denies CP/palp/SOB.  +chronic leg swelling.   3.  OA: B hips and B knees.  followed by pain management every three months.  Taking Hydrocodone four tablets daily.    4.  Hypothyroidism:  Stable on current medicaitons. Patient reports good compliance with medication, good tolerance to medication, and good symptom control.    5.  Anxiety: stable; husband died two years ago; children and grandchildren are grown; still living in family home; last year working.  Will have to sell home due to forced retirement.  Has family locally. Feels very lonely a lot.  Denies SI/HI. Realizes that time on earth is coming to an end and then can be depressing at times. Having more good days than bad.  Compliance with Xanax and feels that it is working well.   6.  CM non-ischemic: followed as needed by cardiology.  Last visit 01/11/13.  Compliance with medications. Denies CP/palp/SOB/DOE/orthopnea.  Suffers with chronic leg swelling; compliance with diuretic.  Last echo 11/2011  with EF 16-83%, grade I diastolic dysfunction.  Chronic PVCs.    7. Decreased hearing R ear: requesting evaluation of R ear; having difficulty hearing out of it.  Review of Systems  Constitutional: Negative for fever, chills, diaphoresis and fatigue.  Respiratory: Negative for cough, shortness of breath and stridor.   Cardiovascular: Positive for palpitations and leg swelling. Negative for chest pain.  Gastrointestinal: Negative for nausea, vomiting, abdominal pain and diarrhea.  Musculoskeletal: Positive for arthralgias.  Neurological: Negative for dizziness, tremors, facial asymmetry, speech difficulty, weakness, light-headedness, numbness and headaches.  Psychiatric/Behavioral: Positive for dysphoric mood. Negative for suicidal ideas, sleep disturbance and self-injury. The patient is nervous/anxious.     Past Medical History  Diagnosis Date  . Nonischemic cardiomyopathy DR [facility]     EF is 25% per echo February 2012, non ischemic myoview 12/2009.  EF 50% echo 7/12 and plans for ICD cancelled.   Marland Kitchen HTN (hypertension)   . PVC (premature ventricular contraction)   . Valvular heart disease     Moderate to severe MR, moderate TR, LAE per echo 7/11  . Spinal stenosis   . Abdominal pain   . History of glaucoma   . Non-healing surgical wound     ABDOMINAL S/P EXPLORATOY LAPAROTOMY 04-18-2011  . History of small bowel obstruction 2009 AND NOV 2012  . Peptic ulcer disease   . GERD (gastroesophageal reflux disease)   . Chronic systolic CHF (congestive heart failure), NYHA class 2     EF 25% 2011 MV,  50-55% echo 6/13  .  Moderate mitral regurgitation   . Moderate tricuspid regurgitation   . Arthritis     FEET, KNEES, HANDS  . H/O hiatal hernia   . Urge urinary incontinence   . Left ovarian cyst   . Diabetes mellitus type 2, diet-controlled   . Shortness of breath   . Wears dentures     upper denture-lower partial  . Hypothyroidism   . Anxiety     maintained on Xanax tid for years.    Allergies  Allergen Reactions  . Aspirin Other (See Comments)    HX Bleeding Ulcer   . Nsaids     Hx of bleeding ulcers  . Tolmetin Rash    Hx of bleeding ulcers   Current Outpatient Prescriptions  Medication Sig Dispense Refill  . ALPRAZolam (XANAX) 0.25 MG tablet Take 1 tablet (0.25 mg total) by mouth 3 (three) times daily as needed for anxiety.  90 tablet  5  . carvedilol (COREG) 12.5 MG tablet Take 1 tablet (12.5 mg total) by mouth 2 (two) times daily with a meal.  60 tablet  11  . furosemide (LASIX) 40 MG tablet Take 1 tablet (40 mg total) by mouth daily.  30 tablet  6  . HYDROcodone-acetaminophen (NORCO/VICODIN) 5-325 MG per tablet Take 1-2 tablets by mouth every 4 (four) hours as needed.  40 tablet  0  . levothyroxine (SYNTHROID, LEVOTHROID) 88 MCG tablet Take 1 tablet (88 mcg total) by mouth daily before breakfast.  30 tablet  11  . losartan (COZAAR) 25 MG tablet TAKE 1 TABLET DAILY.  30 tablet  6  . spironolactone (ALDACTONE) 25 MG tablet Take 12.5 mg by mouth at bedtime.       No current facility-administered medications for this visit.   History   Social History  . Marital Status: Widowed    Spouse Name: N/A    Number of Children: N/A  . Years of Education: N/A   Occupational History  . Not on file.   Social History Main Topics  . Smoking status: Former Smoker -- 30 years    Types: Cigarettes    Quit date: 09/22/2010  . Smokeless tobacco: Never Used     Comment: quit 2011  . Alcohol Use: No  . Drug Use: No  . Sexual Activity: Not Currently   Other Topics Concern  . Not on file   Social History Narrative   Marital status:  Widowed as of 2012; not dating.      Children: four children; six grandchildren; 5 gg.      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      Tobacco:  None; quit in 2012.      Alcohol: none      Drugs:none      Exercise:  No formal exercise program.       Objective:    BP 138/82   Pulse 84  Temp(Src) 97.8 F (36.6 C) (Oral)  Resp 16  Ht 5' 2.75" (1.594 m)  Wt 177 lb (80.287 kg)  BMI 31.60 kg/m2  SpO2 97% Physical Exam  Constitutional: She is oriented to person, place, and time. She appears well-developed and well-nourished. No distress.  HENT:  Head: Normocephalic and atraumatic.  Right Ear: External ear normal.  Left Ear: External ear normal.  Nose: Nose normal.  Mouth/Throat: Oropharynx is clear and moist.  R ear canal with moderate cerumen.  Eyes: Conjunctivae and EOM are normal. Pupils are equal, round, and  reactive to light.  Neck: Normal range of motion. Neck supple. Carotid bruit is not present. No thyromegaly present.  Cardiovascular: Normal rate, regular rhythm, normal heart sounds and intact distal pulses.  Frequent extrasystoles are present. Exam reveals no gallop and no friction rub.   No murmur heard. Pulmonary/Chest: Effort normal and breath sounds normal. She has no wheezes. She has no rales.  Abdominal: Soft. Bowel sounds are normal. She exhibits no distension and no mass. There is no tenderness. There is no rebound and no guarding.  Well healed incisions of abdomen.  Lymphadenopathy:    She has no cervical adenopathy.  Neurological: She is alert and oriented to person, place, and time. No cranial nerve deficit.  Skin: Skin is warm and dry. No rash noted. She is not diaphoretic. No erythema. No pallor.  Psychiatric: She has a normal mood and affect. Her behavior is normal.   Results for orders placed in visit on 09/12/13  CBC WITH DIFFERENTIAL      Result Value Ref Range   WBC 7.2  4.0 - 10.5 K/uL   RBC 3.90  3.87 - 5.11 MIL/uL   Hemoglobin 12.5  12.0 - 15.0 g/dL   HCT 37.1  36.0 - 46.0 %   MCV 95.1  78.0 - 100.0 fL   MCH 32.1  26.0 - 34.0 pg   MCHC 33.7  30.0 - 36.0 g/dL   RDW 13.3  11.5 - 15.5 %   Platelets 203  150 - 400 K/uL   Neutrophils Relative % 52  43 - 77 %   Neutro Abs 3.7  1.7 - 7.7 K/uL   Lymphocytes Relative 38  12 - 46  %   Lymphs Abs 2.7  0.7 - 4.0 K/uL   Monocytes Relative 7  3 - 12 %   Monocytes Absolute 0.5  0.1 - 1.0 K/uL   Eosinophils Relative 2  0 - 5 %   Eosinophils Absolute 0.1  0.0 - 0.7 K/uL   Basophils Relative 1  0 - 1 %   Basophils Absolute 0.1  0.0 - 0.1 K/uL   Smear Review Criteria for review not met    COMPLETE METABOLIC PANEL WITH GFR      Result Value Ref Range   Sodium 138  135 - 145 mEq/L   Potassium 4.6  3.5 - 5.3 mEq/L   Chloride 109  96 - 112 mEq/L   CO2 20  19 - 32 mEq/L   Glucose, Bld 75  70 - 99 mg/dL   BUN 23  6 - 23 mg/dL   Creat 0.81  0.50 - 1.10 mg/dL   Total Bilirubin 0.3  0.2 - 1.2 mg/dL   Alkaline Phosphatase 90  39 - 117 U/L   AST 18  0 - 37 U/L   ALT 17  0 - 35 U/L   Total Protein 6.5  6.0 - 8.3 g/dL   Albumin 4.2  3.5 - 5.2 g/dL   Calcium 9.0  8.4 - 10.5 mg/dL   GFR, Est African American 84     GFR, Est Non African American 73     EKG: sinus brady rate 56; diffuse ST changes.  R ear irrigated during visit.    Assessment & Plan:  Essential hypertension, benign - Plan: CBC with Differential, COMPLETE METABOLIC PANEL WITH GFR, EKG 12-Lead  Generalized anxiety disorder - Plan: ALPRAZolam (XANAX) 0.25 MG tablet  Nonischemic cardiomyopathy  HTN (hypertension)  Cerumen impaction  Intra-abdominal abscess  1. HTN: controlled; no  change in therapy; obtain labs. 2.  Anxiety: controlled; some depressive symptoms but overall stable. Refill of Xanax tid provided. If depression worsens, add SSRI. 3.  Non-ischemic CM: stable at this time; last echo 2013 with improvement; recommend annual follow-up with cardiology. 4. Cerumen impaction R: New.  S/p irrigation and removal of cerumen in office.   5. Intra-abdominal abscess: now resolved.  Meds ordered this encounter  Medications  . ALPRAZolam (XANAX) 0.25 MG tablet    Sig: Take 1 tablet (0.25 mg total) by mouth 3 (three) times daily as needed for anxiety.    Dispense:  90 tablet    Refill:  5    Office visit  needed for additional refills.    Return in about 6 months (around 03/15/2014) for complete physical examiniation.   Reginia Forts, M.D.  [facility] [address] [place], [place]  [address] [phone] phone [phone] fax

## 2013-09-15 ENCOUNTER — Encounter: Payer: Self-pay | Admitting: Family Medicine

## 2013-09-15 DIAGNOSIS — F411 Generalized anxiety disorder: Secondary | ICD-10-CM | POA: Insufficient documentation

## 2013-09-27 ENCOUNTER — Other Ambulatory Visit: Payer: Self-pay | Admitting: *Deleted

## 2013-09-27 MED ORDER — LOSARTAN POTASSIUM 25 MG PO TABS: ORAL_TABLET | ORAL | Status: DC

## 2013-12-14 ENCOUNTER — Other Ambulatory Visit: Payer: Self-pay | Admitting: Cardiology

## 2014-01-09 ENCOUNTER — Other Ambulatory Visit: Payer: Self-pay | Admitting: Cardiology

## 2014-01-30 ENCOUNTER — Encounter: Payer: Medicare Other | Admitting: Family Medicine

## 2014-02-06 ENCOUNTER — Other Ambulatory Visit: Payer: Self-pay | Admitting: Nurse Practitioner

## 2014-02-06 ENCOUNTER — Other Ambulatory Visit: Payer: Self-pay | Admitting: Cardiology

## 2014-02-07 ENCOUNTER — Ambulatory Visit: Payer: Medicare Other | Admitting: Nurse Practitioner

## 2014-02-23 ENCOUNTER — Ambulatory Visit (INDEPENDENT_AMBULATORY_CARE_PROVIDER_SITE_OTHER): Payer: Medicare Other | Admitting: Nurse Practitioner

## 2014-02-23 ENCOUNTER — Encounter: Payer: Self-pay | Admitting: Nurse Practitioner

## 2014-02-23 VITALS — BP 118/82 | HR 77 | Ht 63.0 in | Wt 178.1 lb

## 2014-02-23 DIAGNOSIS — I428 Other cardiomyopathies: Secondary | ICD-10-CM

## 2014-02-23 LAB — CBC
HCT: 37.1 % (ref 36.0–46.0)
Hemoglobin: 12.8 g/dL (ref 12.0–15.0)
MCHC: 34.6 g/dL (ref 30.0–36.0)
MCV: 94.4 fl (ref 78.0–100.0)
Platelets: 212 10*3/uL (ref 150.0–400.0)
RBC: 3.93 Mil/uL (ref 3.87–5.11)
RDW: 12.3 % (ref 11.5–15.5)
WBC: 7.7 10*3/uL (ref 4.0–10.5)

## 2014-02-23 LAB — BASIC METABOLIC PANEL
BUN: 20 mg/dL (ref 6–23)
CO2: 25 mEq/L (ref 19–32)
Calcium: 9.2 mg/dL (ref 8.4–10.5)
Chloride: 107 mEq/L (ref 96–112)
Creatinine, Ser: 1 mg/dL (ref 0.4–1.2)
GFR: 56.42 mL/min — ABNORMAL LOW (ref >60.00)
Glucose, Bld: 82 mg/dL (ref 70–99)
Potassium: 4.2 mEq/L (ref 3.5–5.1)
Sodium: 137 mEq/L (ref 135–145)

## 2014-02-23 LAB — HEPATIC FUNCTION PANEL
ALT: 17 U/L (ref 0–35)
AST: 18 U/L (ref 0–37)
Albumin: 4 g/dL (ref 3.5–5.2)
Alkaline Phosphatase: 74 U/L (ref 39–117)
Bilirubin, Direct: 0 mg/dL (ref 0.0–0.3)
Total Bilirubin: 0.5 mg/dL (ref 0.2–1.2)
Total Protein: 7.1 g/dL (ref 6.0–8.3)

## 2014-02-23 MED ORDER — FUROSEMIDE 40 MG PO TABS: ORAL_TABLET | ORAL | Status: DC

## 2014-02-23 MED ORDER — CARVEDILOL 12.5 MG PO TABS: ORAL_TABLET | ORAL | Status: DC

## 2014-02-23 MED ORDER — SPIRONOLACTONE 25 MG PO TABS: 12.5000 mg | ORAL_TABLET | Freq: Every day | ORAL | Status: DC

## 2014-02-23 MED ORDER — LOSARTAN POTASSIUM 25 MG PO TABS: ORAL_TABLET | ORAL | Status: DC

## 2014-02-23 NOTE — Progress Notes (Addendum)
Teresa [name] Date of Birth: 14-Oct-1940 Medical Record [id number]  History of Present Illness: Teresa [name] is seen back today for a follow up visit. This is a one year check. Seen for Dr. Percival Spanish - former patient of Dr. Susa Simmonds. Has a nonischemic CM. Past EF down to 25%. EF improved with medicines. Plans for an ICD in the past were cancelled. Other issues include tobacco abuse, HTN, PVCs, DM, valvular heart disease, spinal stenosis and glaucoma. Had a small bowel obstruction back in November of 2012 and had surgery with delayed wound healing.   Seen over 2 years ago. Was complaining of dyspnea. Echo was updated. EF was normal. She had stopped smoking. Last seen a year ago so she could get her medicines refilled.   Comes in today. Here alone. Doing ok. She continues to struggle with her weight. Breathing ok unless she really over exerts. She is not very active. No longer working - says she "aged out". No swelling. No chest pain. Not smoking. Her abdominal wound finally healed but this was after an abscess erupted on her right side. Having her physical soon over at [facility].   Current Outpatient Prescriptions  Medication Sig Dispense Refill  . ALPRAZolam (XANAX) 0.25 MG tablet Take 1 tablet (0.25 mg total) by mouth 3 (three) times daily as needed for anxiety.  90 tablet  5  . carvedilol (COREG) 12.5 MG tablet TAKE 1 TABLET TWICE DAILY WITH A MEAL.  60 tablet  0  . furosemide (LASIX) 40 MG tablet prn      . HYDROcodone-acetaminophen (NORCO/VICODIN) 5-325 MG per tablet Take 1-2 tablets by mouth every 4 (four) hours as needed.  40 tablet  0  . levothyroxine (SYNTHROID, LEVOTHROID) 88 MCG tablet Take 1 tablet (88 mcg total) by mouth daily before breakfast.  30 tablet  11  . losartan (COZAAR) 25 MG tablet TAKE 1 TABLET DAILY.  30 tablet  0  . spironolactone (ALDACTONE) 25 MG tablet Take 12.5 mg by mouth at bedtime.       No current facility-administered medications for this visit.    Allergies    Allergen Reactions  . Aspirin Other (See Comments)    HX Bleeding Ulcer   . Nsaids     Hx of bleeding ulcers  . Tolmetin Rash    Hx of bleeding ulcers    Past Medical History  Diagnosis Date  . Nonischemic cardiomyopathy DR [facility]     EF is 25% per echo February 2012, non ischemic myoview 12/2009.  EF 50% echo 7/12 and plans for ICD cancelled.   Marland Kitchen HTN (hypertension)   . PVC (premature ventricular contraction)   . Valvular heart disease     Moderate to severe MR, moderate TR, LAE per echo 7/11  . Spinal stenosis   . Abdominal pain   . History of glaucoma   . Non-healing surgical wound     ABDOMINAL S/P EXPLORATOY LAPAROTOMY 04-18-2011  . History of small bowel obstruction 2009 AND NOV 2012  . Peptic ulcer disease   . GERD (gastroesophageal reflux disease)   . Chronic systolic CHF (congestive heart failure), NYHA class 2     EF 25% 2011 MV,  50-55% echo 6/13  . Moderate mitral regurgitation   . Moderate tricuspid regurgitation   . Arthritis     FEET, KNEES, HANDS  . H/O hiatal hernia   . Urge urinary incontinence   . Left ovarian cyst   . Diabetes mellitus type 2, diet-controlled   . Shortness  of breath   . Wears dentures     upper denture-lower partial  . Hypothyroidism   . Anxiety     maintained on Xanax tid for years.    Past Surgical History  Procedure Laterality Date  . Tubal ligation    . Glaucoma surgery  1978  . Exploratomy lap. / extensive lysis adhesions/ small bowel resection x2 with primary anastomosis x2  04-18-2011    SMALL BOWEL OBSTRUCTION  . Knee arthroscopy w/ meniscectomy  06-05-2004  . Transthoracic echocardiogram  12-27-2010    EF 45-50%/ MODERATE MV REGURG. / LEFT ATRIUM MILDLY DILATED/ MILD TRICUSPID REGURG.  . Cardiovascular stress test  JULY 2011-  DR HOCHREIN    NO ISCHEMIA  . Thyroidectomy  1978 GOITOR    AND CERVICAL COLD KNIFE CONE BX  . Tonsillectomy  AGE 33  . Benign right breast tumor removed    . Appendectomy  1979     RUPTURED  . Abdominal hernia repair  42 YRS AGO    PERITINITIS  . Ventral hernia repair  X4  LAST ONE 20 YRS AGO    ABDOMINAL --  EACH ONE IN DIFFERENT AREA  . Wound debridement  09/25/2011    Procedure: DEBRIDEMENT CLOSURE/ABDOMINAL WOUND;  Surgeon: Theodoro Kos, DO;  Location: [facility];  Service: Plastics;  Laterality: N/A;  excision of abdominal wound with primary closure  . Ventral hernia repair  01/29/2012    Procedure: HERNIA REPAIR VENTRAL ADULT;  Surgeon: Theodoro Kos, DO;  Location: [facility];  Service: Plastics;  Laterality: N/A;  . Hernia repair    . Incision and drainage of wound N/A 09/13/2012    Procedure: INCISION  AND DEBRIDEMENT WOUND abdominal wall ;  Surgeon: Rolm Bookbinder, MD;  Location: [facility];  Service: General;  Laterality: N/A;  coordination with Dr Migdalia Dk     History  Smoking status  . Former Smoker -- 68 years  . Types: Cigarettes  . Quit date: 09/22/2010  Smokeless tobacco  . Never Used    Comment: quit 2011    History  Alcohol Use No    Family History  Problem Relation Age of Onset  . Stroke Brother   . Stroke Sister   . Heart disease Sister     Review of Systems: The review of systems is per the HPI.  All other systems were reviewed and are negative.  Physical Exam: BP 118/82  Pulse 77  Ht 5\' 3"  (1.6 m)  Wt 178 lb 1.9 oz (80.795 kg)  BMI 31.56 kg/m2  SpO2 91% Patient is very pleasant and in no acute distress. She is obese. Skin is warm and dry. Color is normal.  HEENT is unremarkable. Normocephalic/atraumatic. PERRL. Sclera are nonicteric. Neck is supple. No masses. No JVD. Lungs are clear. Cardiac exam shows a regular rate and rhythm. Abdomen is soft. Extremities are without edema. Gait and ROM are intact. No gross neurologic deficits noted.  Wt Readings from Last 3 Encounters:  02/23/14 178 lb 1.9 oz (80.795 kg)  09/12/13 177 lb (80.287 kg)  04/20/13 169 lb (76.658 kg)    LABORATORY  DATA/PROCEDURES:  EKG today shows sinus rhythm with diffuse ST/T wave changes - unchanged.   Lab Results  Component Value Date   WBC 7.2 09/12/2013   HGB 12.5 09/12/2013   HCT 37.1 09/12/2013   PLT 203 09/12/2013   GLUCOSE 75 09/12/2013   CHOL 201* 04/20/2013   TRIG 145 04/20/2013   HDL 30* 04/20/2013  LDLDIRECT 182.5 09/12/2010   LDLCALC 142* 04/20/2013   ALT 17 09/12/2013   AST 18 09/12/2013   NA 138 09/12/2013   K 4.6 09/12/2013   CL 109 09/12/2013   CREATININE 0.81 09/12/2013   BUN 23 09/12/2013   CO2 20 09/12/2013   TSH 0.625 04/20/2013   INR 1.08 02/04/2013   HGBA1C 5.6 04/20/2013    BNP (last 3 results) No results found for this basename: PROBNP,  in the last 8760 hours  Echo Study Conclusions from June 2013  - Left ventricle: The cavity size was normal. Wall thickness was increased in a pattern of mild LVH. Systolic function was normal. The estimated ejection fraction was in the range of 50% to 55%. Wall motion was normal; there were no regional wall motion abnormalities. Doppler parameters are consistent with abnormal left ventricular relaxation (grade 1 diastolic dysfunction). - Aortic valve: Mild regurgitation. - Mitral valve: Mild regurgitation. - Left atrium: The atrium was mildly dilated. - Pulmonary arteries: Systolic pressure was mildly increased. PA peak pressure: 40mm Hg (S).  Assessment / Plan:  1. Nonischemic CM - EF per echo in 2013 was at 50 to 55%. She is felt to be doing well. I have refilled her medicines today. See her back in a year. Check baseline labs today.   2. Tobacco abuse - not smoking   3. Obesity -  Encouraged activity.   I will see her back in a year. Check baseline labs today. Seems stable from our standpoint.   Patient is agreeable to this plan and will call if any problems develop in the interim.   Burtis Junes, RN, [facility] [address] [place], [place]  [address] [phone]

## 2014-02-23 NOTE — Patient Instructions (Addendum)
Stay on your current medicines -  I have refilled your cardiac medicines  We will check lab today  See me in one year  Stay active  Call the [place] office at [phone] if you have any questions, problems or concerns.

## 2014-03-01 ENCOUNTER — Encounter: Payer: Self-pay | Admitting: Family Medicine

## 2014-03-01 ENCOUNTER — Ambulatory Visit (INDEPENDENT_AMBULATORY_CARE_PROVIDER_SITE_OTHER): Payer: Medicare Other | Admitting: Family Medicine

## 2014-03-01 VITALS — BP 136/80 | HR 80 | Temp 97.7°F | Resp 16 | Ht 63.5 in | Wt 183.0 lb

## 2014-03-01 DIAGNOSIS — I428 Other cardiomyopathies: Secondary | ICD-10-CM

## 2014-03-01 DIAGNOSIS — F411 Generalized anxiety disorder: Secondary | ICD-10-CM

## 2014-03-01 DIAGNOSIS — Z0000 Encounter for general adult medical examination without abnormal findings: Secondary | ICD-10-CM

## 2014-03-01 DIAGNOSIS — N181 Chronic kidney disease, stage 1: Secondary | ICD-10-CM

## 2014-03-01 DIAGNOSIS — E7800 Pure hypercholesterolemia, unspecified: Secondary | ICD-10-CM

## 2014-03-01 DIAGNOSIS — N898 Other specified noninflammatory disorders of vagina: Secondary | ICD-10-CM

## 2014-03-01 DIAGNOSIS — E785 Hyperlipidemia, unspecified: Secondary | ICD-10-CM

## 2014-03-01 DIAGNOSIS — Z23 Encounter for immunization: Secondary | ICD-10-CM

## 2014-03-01 DIAGNOSIS — Z01419 Encounter for gynecological examination (general) (routine) without abnormal findings: Secondary | ICD-10-CM

## 2014-03-01 DIAGNOSIS — I10 Essential (primary) hypertension: Secondary | ICD-10-CM

## 2014-03-01 DIAGNOSIS — E119 Type 2 diabetes mellitus without complications: Secondary | ICD-10-CM

## 2014-03-01 DIAGNOSIS — E039 Hypothyroidism, unspecified: Secondary | ICD-10-CM

## 2014-03-01 LAB — POCT URINALYSIS DIPSTICK
BILIRUBIN UA: NEGATIVE
Glucose, UA: NEGATIVE
Ketones, UA: NEGATIVE
NITRITE UA: NEGATIVE
PH UA: 5
Protein, UA: NEGATIVE
RBC UA: NEGATIVE
SPEC GRAV UA: 1.01
Urobilinogen, UA: 0.2

## 2014-03-01 LAB — CBC WITH DIFFERENTIAL/PLATELET
Basophils Absolute: 0.1 10*3/uL (ref 0.0–0.1)
Basophils Relative: 1 % (ref 0–1)
EOS ABS: 0.1 10*3/uL (ref 0.0–0.7)
Eosinophils Relative: 2 % (ref 0–5)
HEMATOCRIT: 38.3 % (ref 36.0–46.0)
HEMOGLOBIN: 12.9 g/dL (ref 12.0–15.0)
Lymphocytes Relative: 38 % (ref 12–46)
Lymphs Abs: 2.2 10*3/uL (ref 0.7–4.0)
MCH: 32.2 pg (ref 26.0–34.0)
MCHC: 33.7 g/dL (ref 30.0–36.0)
MCV: 95.5 fL (ref 78.0–100.0)
MONO ABS: 0.4 10*3/uL (ref 0.1–1.0)
MONOS PCT: 7 % (ref 3–12)
NEUTROS PCT: 52 % (ref 43–77)
Neutro Abs: 3 10*3/uL (ref 1.7–7.7)
Platelets: 216 10*3/uL (ref 150–400)
RBC: 4.01 MIL/uL (ref 3.87–5.11)
RDW: 12.8 % (ref 11.5–15.5)
WBC: 5.8 10*3/uL (ref 4.0–10.5)

## 2014-03-01 LAB — LIPID PANEL
CHOL/HDL RATIO: 6.6 ratio
Cholesterol: 219 mg/dL — ABNORMAL HIGH (ref 0–200)
HDL: 33 mg/dL — AB (ref >39)
LDL Cholesterol: 139 mg/dL — ABNORMAL HIGH (ref 0–99)
Triglycerides: 233 mg/dL — ABNORMAL HIGH (ref <150)
VLDL: 47 mg/dL — ABNORMAL HIGH (ref 0–40)

## 2014-03-01 LAB — COMPLETE METABOLIC PANEL WITH GFR
ALK PHOS: 81 U/L (ref 39–117)
ALT: 18 U/L (ref 0–35)
AST: 17 U/L (ref 0–37)
Albumin: 4.2 g/dL (ref 3.5–5.2)
BILIRUBIN TOTAL: 0.3 mg/dL (ref 0.2–1.2)
BUN: 16 mg/dL (ref 6–23)
CO2: 24 meq/L (ref 19–32)
Calcium: 9.1 mg/dL (ref 8.4–10.5)
Chloride: 108 mEq/L (ref 96–112)
Creat: 1.04 mg/dL (ref 0.50–1.10)
GFR, EST AFRICAN AMERICAN: 62 mL/min
GFR, EST NON AFRICAN AMERICAN: 53 mL/min — AB
GLUCOSE: 110 mg/dL — AB (ref 70–99)
Potassium: 4.7 mEq/L (ref 3.5–5.3)
Sodium: 140 mEq/L (ref 135–145)
Total Protein: 6.5 g/dL (ref 6.0–8.3)

## 2014-03-01 LAB — TSH: TSH: 0.454 u[IU]/mL (ref 0.350–4.500)

## 2014-03-01 LAB — POCT GLYCOSYLATED HEMOGLOBIN (HGB A1C): Hemoglobin A1C: 5.4

## 2014-03-01 LAB — MICROALBUMIN, URINE: Microalb, Ur: 0.51 mg/dL (ref 0.00–1.89)

## 2014-03-01 MED ORDER — ALPRAZOLAM 0.25 MG PO TABS: 0.2500 mg | ORAL_TABLET | Freq: Three times a day (TID) | ORAL | Status: DC | PRN

## 2014-03-01 NOTE — Patient Instructions (Signed)

## 2014-03-01 NOTE — Progress Notes (Signed)
Subjective:   This chart was scribed for Wardell Honour, MD by Edison Simon, ED Scribe. This patient was seen in room 21 and the patient's care was started at 9:46 AM.    Patient ID: Teresa [name], female    DOB: 04-12-1941, 73 y.o.   MRN: [id number]  03/01/2014  Annual Exam, Hypertension, Hypothyroidism and Anxiety   HPI  HPI Comments: Teresa [name] is a 73 y.o. female who presents to the [facility] for physical exam, she states she does not remember when her last exam was. She reports extensive abdominal surgeries in the past 3 years. She reports hearing problems with her right ear despite it being cleaned out last time she was here. She states her last pap smear was several years ago, her last mammogram was 5 years ago, her last colonoscopy was 5 years ago with abdominal resectioning surgery since then, tetanus shot was less than 10 years ago, last pneumonia shot was 3 years ago, and states she has not had a shingles vaccine. She reports her last eye exam was many years ago and reports a past glaucoma surgery. She states she has no surviving siblings.   She denies recent smoking or alcohol use. 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 She states she goes on walks without need of a cane. She states she pays her own bills, cleans her home, and lives alone. She states she does not have a living will.   She states she has been well emotionally and takes Xanax 3 times a day. She states she takes Lasix approximately 3 times a week, more with swelling as needed. She states she takes hydrocodone 3 times a day. She states she is compliant with her thyroid medication and Spironolactone.   She states she  sees her cardiologist once a year and goes to the pain clinic every 3 months.   She denies recent falls, headaches, dizziness, tinnitus, chest pain, palpitations, shortness of breath, cough, or neck pain. She reports sometimes having occasional shoulder pain, foot  cramps, rhinorrhea and seasonal allergies,   BLADDER CONTROL: NO LEAKING DURING THE DAY UNLESS COUGHS OR SNEEZES.  NOCTURIA X 1.  DOES NOT NEED TO WEAR PAD OR DEPENDS.   Review of Systems  Constitutional: Negative for fever, chills, diaphoresis, activity change, appetite change, fatigue and unexpected weight change.  HENT: Positive for hearing loss (right ear) and rhinorrhea. Negative for congestion, dental problem, drooling, ear discharge, ear pain, facial swelling, mouth sores, nosebleeds, postnasal drip, sinus pressure, sneezing, sore throat, tinnitus, trouble swallowing and voice change.   Eyes: Negative for photophobia, pain, discharge, redness, itching and visual disturbance.  Respiratory: Negative for apnea, cough, choking, chest tightness, shortness of breath, wheezing and stridor.   Cardiovascular: Positive for palpitations and leg swelling. Negative for chest pain.  Gastrointestinal: Positive for diarrhea. Negative for nausea, vomiting, abdominal pain, constipation, blood in stool, abdominal distention, anal bleeding and rectal pain.  Endocrine: Positive for cold intolerance. Negative for heat intolerance, polydipsia, polyphagia and polyuria.  Genitourinary: Negative for dysuria, urgency, frequency, hematuria, flank pain, decreased urine volume, vaginal bleeding, vaginal discharge, enuresis, difficulty urinating, genital sores, vaginal pain, menstrual problem, pelvic pain and dyspareunia.  Musculoskeletal: Positive for arthralgias (from arthritis) and joint swelling. Negative for back pain, gait problem, myalgias, neck pain and neck stiffness.       Foot cramps, shoulder pain  Skin: Negative for color change, pallor, rash and wound.  Allergic/Immunologic: Positive for environmental  allergies. Negative for food allergies and immunocompromised state.  Neurological: Negative for dizziness, tremors, seizures, syncope, facial asymmetry, speech difficulty, weakness, light-headedness, numbness  and headaches.  Hematological: Negative for adenopathy. Does not bruise/bleed easily.  Psychiatric/Behavioral: Negative for suicidal ideas, hallucinations, behavioral problems, confusion, sleep disturbance, self-injury, dysphoric mood, decreased concentration and agitation. The patient is not nervous/anxious and is not hyperactive.     Past Medical History  Diagnosis Date  . Nonischemic cardiomyopathy [facility]     EF is 25% per echo February 2012, non ischemic myoview 12/2009.  EF 50% echo 7/12 and plans for ICD cancelled.   Marland Kitchen HTN (hypertension)   . PVC (premature ventricular contraction)   . Valvular heart disease     Moderate to severe MR, moderate TR, LAE per echo 7/11  . Spinal stenosis   . Abdominal pain   . History of glaucoma   . Non-healing surgical wound     ABDOMINAL S/P EXPLORATOY LAPAROTOMY 04-18-2011  . History of small bowel obstruction 2009 AND NOV 2012  . Peptic ulcer disease   . GERD (gastroesophageal reflux disease)   . Chronic systolic CHF (congestive heart failure), NYHA class 2     EF 25% 2011 MV,  50-55% echo 6/13  . Moderate mitral regurgitation   . Moderate tricuspid regurgitation   . Arthritis     FEET, KNEES, HANDS  . H/O hiatal hernia   . Urge urinary incontinence   . Left ovarian cyst   . Diabetes mellitus type 2, diet-controlled   . Shortness of breath   . Wears dentures     upper denture-lower partial  . Hypothyroidism   . Anxiety     maintained on Xanax tid for years.  . Glaucoma     s/p surgery B in 29s.  [place].   Past Surgical History  Procedure Laterality Date  . Tubal ligation    . Glaucoma surgery  1978  . Exploratomy lap. / extensive lysis adhesions/ small bowel resection x2 with primary anastomosis x2  04-18-2011    SMALL BOWEL OBSTRUCTION  . Knee arthroscopy w/ meniscectomy  06-05-2004  . Transthoracic echocardiogram  12-27-2010    EF 45-50%/ MODERATE MV REGURG. / LEFT ATRIUM MILDLY DILATED/ MILD TRICUSPID REGURG.  .  Cardiovascular stress test  JULY 2011-  DR HOCHREIN    NO ISCHEMIA  . Thyroidectomy  1978 GOITOR    AND CERVICAL COLD KNIFE CONE BX  . Tonsillectomy  AGE 40  . Benign right breast tumor removed    . Appendectomy  1979    RUPTURED  . Abdominal hernia repair  47 YRS AGO    PERITINITIS  . Ventral hernia repair  X4  LAST ONE 20 YRS AGO    ABDOMINAL --  EACH ONE IN DIFFERENT AREA  . Wound debridement  09/25/2011    Procedure: DEBRIDEMENT CLOSURE/ABDOMINAL WOUND;  Surgeon: Theodoro Kos, DO;  Location: [place];  Service: Plastics;  Laterality: N/A;  excision of abdominal wound with primary closure  . Ventral hernia repair  01/29/2012    Procedure: HERNIA REPAIR VENTRAL ADULT;  Surgeon: Theodoro Kos, DO;  Location: [place];  Service: Plastics;  Laterality: N/A;  . Hernia repair    . Incision and drainage of wound N/A 09/13/2012    Procedure: INCISION  AND DEBRIDEMENT WOUND abdominal wall ;  Surgeon: Rolm Bookbinder, MD;  Location: [place];  Service: General;  Laterality: N/A;  coordination with Dr Migdalia Dk    Allergies  Allergen  Reactions  . Aspirin Other (See Comments)    HX Bleeding Ulcer   . Nsaids     Hx of bleeding ulcers  . Tolmetin Rash    Hx of bleeding ulcers   Current Outpatient Prescriptions  Medication Sig Dispense Refill  . ALPRAZolam (XANAX) 0.25 MG tablet Take 1 tablet (0.25 mg total) by mouth 3 (three) times daily as needed for anxiety.  90 tablet  5  . carvedilol (COREG) 12.5 MG tablet TAKE 1 TABLET TWICE DAILY WITH A MEAL.  60 tablet  11  . furosemide (LASIX) 40 MG tablet prn  30 tablet  11  . HYDROcodone-acetaminophen (NORCO/VICODIN) 5-325 MG per tablet Take 1-2 tablets by mouth every 4 (four) hours as needed.  40 tablet  0  . levothyroxine (SYNTHROID, LEVOTHROID) 88 MCG tablet Take 1 tablet (88 mcg total) by mouth daily before breakfast.  30 tablet  11  . losartan (COZAAR) 25 MG tablet TAKE 1 TABLET DAILY.  30 tablet  11  .  spironolactone (ALDACTONE) 25 MG tablet Take 0.5 tablets (12.5 mg total) by mouth at bedtime.  30 tablet  11   No current facility-administered medications for this visit.   History   Social History  . Marital Status: Widowed    Spouse Name: N/A    Number of Children: N/A  . Years of Education: N/A   Occupational History  . Not on file.   Social History Main Topics  . Smoking status: Former Smoker -- 30 years    Types: Cigarettes    Quit date: 09/22/2010  . Smokeless tobacco: Never Used     Comment: quit 2011  . Alcohol Use: No  . Drug Use: No  . Sexual Activity: Not Currently   Other Topics Concern  . Not on file   Social History Narrative   Marital status:  Widowed as of 2012; not dating.        Children: four children; six grandchildren; 5 gg.      Lives: alone with a yorkie.      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      Tobacco:  None; quit in 2012.        Alcohol: none      Drugs:none      Exercise:  No formal exercise program.  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  Cannot swim.        ADLs:  Driving; cleans own house; does grocery shopping; pays bills; does not use cane for ambulation.      Advanced Directives: no living will; no HCPOA.  DNR/DNI.     Family History  Problem Relation Age of Onset  . Stroke Brother   . Stroke Sister   . Heart disease Sister        Objective:    BP 136/80  Pulse 80  Temp(Src) 97.7 F (36.5 C) (Oral)  Resp 16  Ht 5' 3.5" (1.613 m)  Wt 183 lb (83.008 kg)  BMI 31.90 kg/m2  SpO2 98% Physical Exam  Nursing note and vitals reviewed. Constitutional: She is oriented to person, place, and time. She appears well-developed and well-nourished. No distress.  HENT:  Head: Normocephalic and atraumatic.  Right Ear: External ear normal.  Left Ear: External ear normal.  Nose: Nose normal.  Mouth/Throat: Oropharynx is clear and moist.  TMs normal  Eyes: Conjunctivae and EOM are  normal. Pupils are equal, round, and reactive to light.  Neck: Normal range of  motion and full passive range of motion without pain. Neck supple. No JVD present. Carotid bruit is not present. No tracheal deviation present. No thyromegaly present.  No carotid bruit  Cardiovascular: Normal rate, regular rhythm and normal heart sounds.  Exam reveals no gallop and no friction rub.   No murmur heard. Pulmonary/Chest: Effort normal and breath sounds normal. No respiratory distress. She has no wheezes. She has no rales. She exhibits no tenderness. Right breast exhibits no inverted nipple, no mass, no nipple discharge, no skin change and no tenderness. Left breast exhibits no inverted nipple, no mass, no nipple discharge, no skin change and no tenderness. Breasts are symmetrical.    Normal breast exam  Abdominal: Soft. Bowel sounds are normal. She exhibits no distension and no mass. There is no tenderness. There is no rebound and no guarding.  Well healed incisions/scars of abdomen.  Obese.  Genitourinary: Uterus normal. No breast swelling, tenderness, discharge or bleeding. There is no rash, tenderness or lesion on the right labia. There is no rash, tenderness or lesion on the left labia. Cervix exhibits discharge. Cervix exhibits no motion tenderness and no friability. Right adnexum displays no mass, no tenderness and no fullness. Left adnexum displays no mass, no tenderness and no fullness. Vaginal discharge (scant amount of yellow brown discharge) found.  Scant yellow-brown discharge.  Musculoskeletal: Normal range of motion.       Right shoulder: Normal.       Left shoulder: Normal.       Cervical back: Normal.  Lymphadenopathy:    She has no cervical adenopathy.  Neurological: She is alert and oriented to person, place, and time. She has normal reflexes. No cranial nerve deficit. She exhibits normal muscle tone. Coordination normal.  Skin: Skin is warm and dry. No rash noted. She is not  diaphoretic. No erythema. No pallor.  Psychiatric: She has a normal mood and affect. Her behavior is normal. Judgment and thought content normal.   Results for orders placed in visit on 03/01/14  CBC WITH DIFFERENTIAL      Result Value Ref Range   WBC 5.8  4.0 - 10.5 K/uL   RBC 4.01  3.87 - 5.11 MIL/uL   Hemoglobin 12.9  12.0 - 15.0 g/dL   HCT 38.3  36.0 - 46.0 %   MCV 95.5  78.0 - 100.0 fL   MCH 32.2  26.0 - 34.0 pg   MCHC 33.7  30.0 - 36.0 g/dL   RDW 12.8  11.5 - 15.5 %   Platelets 216  150 - 400 K/uL   Neutrophils Relative % 52  43 - 77 %   Neutro Abs 3.0  1.7 - 7.7 K/uL   Lymphocytes Relative 38  12 - 46 %   Lymphs Abs 2.2  0.7 - 4.0 K/uL   Monocytes Relative 7  3 - 12 %   Monocytes Absolute 0.4  0.1 - 1.0 K/uL   Eosinophils Relative 2  0 - 5 %   Eosinophils Absolute 0.1  0.0 - 0.7 K/uL   Basophils Relative 1  0 - 1 %   Basophils Absolute 0.1  0.0 - 0.1 K/uL   Smear Review Criteria for review not met    COMPLETE METABOLIC PANEL WITH GFR      Result Value Ref Range   Sodium 140  135 - 145 mEq/L   Potassium 4.7  3.5 - 5.3 mEq/L   Chloride 108  96 - 112 mEq/L   CO2 24  19 -  32 mEq/L   Glucose, Bld 110 (*) 70 - 99 mg/dL   BUN 16  6 - 23 mg/dL   Creat 1.04  0.50 - 1.10 mg/dL   Total Bilirubin 0.3  0.2 - 1.2 mg/dL   Alkaline Phosphatase 81  39 - 117 U/L   AST 17  0 - 37 U/L   ALT 18  0 - 35 U/L   Total Protein 6.5  6.0 - 8.3 g/dL   Albumin 4.2  3.5 - 5.2 g/dL   Calcium 9.1  8.4 - 10.5 mg/dL   GFR, Est African American 62     GFR, Est Non African American 53 (*)   LIPID PANEL      Result Value Ref Range   Cholesterol 219 (*) 0 - 200 mg/dL   Triglycerides 233 (*) <150 mg/dL   HDL 33 (*) >39 mg/dL   Total CHOL/HDL Ratio 6.6     VLDL 47 (*) 0 - 40 mg/dL   LDL Cholesterol 139 (*) 0 - 99 mg/dL  TSH      Result Value Ref Range   TSH 0.454  0.350 - 4.500 uIU/mL  MICROALBUMIN, URINE      Result Value Ref Range   Microalb, Ur 0.51  0.00 - 1.89 mg/dL  POCT URINALYSIS  DIPSTICK      Result Value Ref Range   Color, UA yellow     Clarity, UA clear     Glucose, UA neg     Bilirubin, UA neg     Ketones, UA neg     Spec Grav, UA 1.010     Blood, UA neg     pH, UA 5.0     Protein, UA neg     Urobilinogen, UA 0.2     Nitrite, UA neg     Leukocytes, UA small (1+)    POCT GLYCOSYLATED HEMOGLOBIN (HGB A1C)      Result Value Ref Range   Hemoglobin A1C 5.4    PAP IG (IMAGE GUIDED)      Result Value Ref Range   Specimen adequacy: SEE NOTE     FINAL DIAGNOSIS: SEE NOTE     Cytotechnologist: SEE NOTE     PREVNAR 13 ADMINISTERED.     Assessment & Plan:   1. Routine general medical examination at a health care facility   2. Essential hypertension, benign   3. Pure hypercholesterolemia   4. Type II or unspecified type diabetes mellitus without mention of complication, not stated as uncontrolled   5. Routine gynecological examination   6. Generalized anxiety disorder   7. Need for prophylactic vaccination against Streptococcus pneumoniae (pneumococcus)   8. Dyslipidemia   9. Chronic renal insufficiency, stage 1   10. Nonischemic cardiomyopathy     1. Annual Wellness Exam: anticipatory guidance --- exercise, weight loss.  Pap smear obtained.  Pt REFUSED mammogram and bone density scan.  Colonoscopy UTD and should be due in upcoming year.  S/p Prevnar-13; pt refused flu vaccine and Zostavax.  Independent with ADLs; moderate fall risk due to chronic narcotic use and benzo use.  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  No evidence of depression but being treated for anxiety.   No advanced directives but desires DNR/DNI.  No bladder incontinence.  2. Gynecological exam: completed; pap smear obtained; pt REFUSED mammogram; breast exam completed.  Menopausal. 3.  HTN: controlled; obtain labs and u/a; refills provided. 4.  Hypothyroidism: controlled; obtain labs; refill provided. 5.  Anxiety: controlled; refill of  Xanax tid provided; pt has been maintained on this regimen  for years and very resistant to changing medications when stable.  Agreed. 6.  Dyslipidemia: stable; obtain labs; continue with dietary modification unless warrants medication. 7. DMII: controlled with diet; obtain labs and urine microalbumin. 8.  Renal insufficiency: stable; obtain u/a and creatinine. 9.  Cardiomyopathy: stable; asymptomatic; followed by cardiology yearly. 10. Hypothyroidism: controlled; obtain labs; refill provided.   11.  S/p Prevnar -13. 12. Chronic pain syndrome: stable; followed by pain management every three months.  Maintained on narcotics daily.    Meds ordered this encounter  Medications  . ALPRAZolam (XANAX) 0.25 MG tablet    Sig: Take 1 tablet (0.25 mg total) by mouth 3 (three) times daily as needed for anxiety.    Dispense:  90 tablet    Refill:  5    Office visit needed for additional refills.    Return in about 6 months (around 08/30/2014) for recheck thyroid, blood pressure.  I personally performed the services described in this documentation, which was scribed in my presence.  The recorded information has been reviewed and is accurate.  Reginia Forts, M.D.  [facility] [address] [place], [place]  [address] [phone] phone [phone] fax

## 2014-03-02 LAB — PAP IG (IMAGE GUIDED)

## 2014-03-03 MED ORDER — FLUCONAZOLE 150 MG PO TABS: 150.0000 mg | ORAL_TABLET | Freq: Once | ORAL | Status: DC

## 2014-03-03 MED ORDER — LEVOTHYROXINE SODIUM 88 MCG PO TABS: 88.0000 ug | ORAL_TABLET | Freq: Every day | ORAL | Status: DC

## 2014-05-25 ENCOUNTER — Telehealth: Payer: Self-pay

## 2014-05-25 NOTE — Telephone Encounter (Signed)
LMVM reminding patient to get her flu shot.

## 2014-08-29 ENCOUNTER — Other Ambulatory Visit: Payer: Self-pay | Admitting: Family Medicine

## 2014-08-30 ENCOUNTER — Encounter: Payer: Self-pay | Admitting: Family Medicine

## 2014-08-30 ENCOUNTER — Ambulatory Visit (INDEPENDENT_AMBULATORY_CARE_PROVIDER_SITE_OTHER): Payer: Medicare Other

## 2014-08-30 ENCOUNTER — Ambulatory Visit (INDEPENDENT_AMBULATORY_CARE_PROVIDER_SITE_OTHER): Payer: Medicare Other | Admitting: Family Medicine

## 2014-08-30 VITALS — BP 140/60 | HR 91 | Temp 98.3°F | Resp 16 | Ht 63.0 in | Wt 183.8 lb

## 2014-08-30 DIAGNOSIS — R7302 Impaired glucose tolerance (oral): Secondary | ICD-10-CM | POA: Diagnosis not present

## 2014-08-30 DIAGNOSIS — E038 Other specified hypothyroidism: Secondary | ICD-10-CM

## 2014-08-30 DIAGNOSIS — M25511 Pain in right shoulder: Secondary | ICD-10-CM

## 2014-08-30 DIAGNOSIS — H9191 Unspecified hearing loss, right ear: Secondary | ICD-10-CM | POA: Diagnosis not present

## 2014-08-30 DIAGNOSIS — E7800 Pure hypercholesterolemia, unspecified: Secondary | ICD-10-CM

## 2014-08-30 DIAGNOSIS — I428 Other cardiomyopathies: Secondary | ICD-10-CM

## 2014-08-30 DIAGNOSIS — I429 Cardiomyopathy, unspecified: Secondary | ICD-10-CM

## 2014-08-30 DIAGNOSIS — I10 Essential (primary) hypertension: Secondary | ICD-10-CM

## 2014-08-30 DIAGNOSIS — F411 Generalized anxiety disorder: Secondary | ICD-10-CM | POA: Diagnosis not present

## 2014-08-30 DIAGNOSIS — E034 Atrophy of thyroid (acquired): Secondary | ICD-10-CM

## 2014-08-30 LAB — CBC WITH DIFFERENTIAL/PLATELET
BASOS ABS: 0.1 10*3/uL (ref 0.0–0.1)
Basophils Relative: 1 % (ref 0–1)
Eosinophils Absolute: 0.1 10*3/uL (ref 0.0–0.7)
Eosinophils Relative: 2 % (ref 0–5)
HEMATOCRIT: 39 % (ref 36.0–46.0)
Hemoglobin: 12.8 g/dL (ref 12.0–15.0)
Lymphocytes Relative: 32 % (ref 12–46)
Lymphs Abs: 1.8 10*3/uL (ref 0.7–4.0)
MCH: 31.8 pg (ref 26.0–34.0)
MCHC: 32.8 g/dL (ref 30.0–36.0)
MCV: 96.8 fL (ref 78.0–100.0)
MONO ABS: 0.3 10*3/uL (ref 0.1–1.0)
MPV: 10.3 fL (ref 8.6–12.4)
Monocytes Relative: 6 % (ref 3–12)
NEUTROS ABS: 3.2 10*3/uL (ref 1.7–7.7)
Neutrophils Relative %: 59 % (ref 43–77)
Platelets: 191 10*3/uL (ref 150–400)
RBC: 4.03 MIL/uL (ref 3.87–5.11)
RDW: 12.9 % (ref 11.5–15.5)
WBC: 5.5 10*3/uL (ref 4.0–10.5)

## 2014-08-30 LAB — LIPID PANEL
CHOL/HDL RATIO: 6.2 ratio
CHOLESTEROL: 216 mg/dL — AB (ref 0–200)
HDL: 35 mg/dL — AB (ref >=46)
LDL Cholesterol: 143 mg/dL — ABNORMAL HIGH (ref 0–99)
Triglycerides: 188 mg/dL — ABNORMAL HIGH (ref <150)
VLDL: 38 mg/dL (ref 0–40)

## 2014-08-30 LAB — COMPREHENSIVE METABOLIC PANEL
ALT: 17 U/L (ref 0–35)
AST: 18 U/L (ref 0–37)
Albumin: 4.2 g/dL (ref 3.5–5.2)
Alkaline Phosphatase: 66 U/L (ref 39–117)
BUN: 19 mg/dL (ref 6–23)
CHLORIDE: 106 meq/L (ref 96–112)
CO2: 23 mEq/L (ref 19–32)
CREATININE: 1.01 mg/dL (ref 0.50–1.10)
Calcium: 9.3 mg/dL (ref 8.4–10.5)
Glucose, Bld: 123 mg/dL — ABNORMAL HIGH (ref 70–99)
Potassium: 5 mEq/L (ref 3.5–5.3)
SODIUM: 137 meq/L (ref 135–145)
TOTAL PROTEIN: 6.6 g/dL (ref 6.0–8.3)
Total Bilirubin: 0.4 mg/dL (ref 0.2–1.2)

## 2014-08-30 LAB — TSH: TSH: 1.49 u[IU]/mL (ref 0.350–4.500)

## 2014-08-30 MED ORDER — ALPRAZOLAM 0.25 MG PO TABS: 0.2500 mg | ORAL_TABLET | Freq: Three times a day (TID) | ORAL | Status: DC | PRN

## 2014-08-30 NOTE — Patient Instructions (Signed)

## 2014-08-30 NOTE — Progress Notes (Signed)
Subjective:    Patient ID: Teresa [name], female    DOB: 10-10-1940, 74 y.o.   MRN: [id number]  08/30/2014  Medication Refill; Anxiety; and Hypothyroidism   HPI This 74 y.o. female presents for six month follow-up:  1. HTN: Patient reports good compliance with medication, good tolerance to medication, and good symptom control.  Denies CP/palp/SOB/leg swelling.   2. Hypothyroidism: Patient reports good compliance with medication, good tolerance to medication, and good symptom control.    3. Anxiety:  Son in ICU at [facility]; had syncopal event in bathroom; defibrillator kept going off; potassium dangerously low.  Ran out of Alprazolam yesterday.  Stays scared; feels helpless to help son.  Son lives alone; son cannot live with pt unless forced; pt cannot live with son because he lives in [place].  4.  Cardiomyopathy:  Followed annually by cardiology.  Denies CP/palp/SOB/orthopnea/leg swelling.    5. Chronic pain syndrome: followed by pain management.    6. R arm/shoulder pain: onset two months ago.  Radiates into R elbow.  When goes to lift shoulder, hurts; decreased ROM.  Tolerable.  No injury or overuse.    7. R hearing loss: had ears irrigated at last visit without improvement in hearing.  Starting to interfere with quality of life.  Not interested in ENT evaluation at this time due to son in ICU.   Review of Systems  Constitutional: Negative for fever, chills, diaphoresis and fatigue.  Eyes: Negative for visual disturbance.  Respiratory: Negative for cough and shortness of breath.   Cardiovascular: Negative for chest pain, palpitations and leg swelling.  Gastrointestinal: Negative for nausea, vomiting, abdominal pain, diarrhea and constipation.  Endocrine: Negative for cold intolerance, heat intolerance, polydipsia, polyphagia and polyuria.  Musculoskeletal: Positive for arthralgias.  Neurological: Negative for dizziness, tremors, seizures, syncope, facial asymmetry, speech  difficulty, weakness, light-headedness, numbness and headaches.  Psychiatric/Behavioral: Positive for dysphoric mood. Negative for suicidal ideas, sleep disturbance and self-injury. The patient is nervous/anxious.     Past Medical History  Diagnosis Date  . Nonischemic cardiomyopathy DR [facility]     EF is 25% per echo February 2012, non ischemic myoview 12/2009.  EF 50% echo 7/12 and plans for ICD cancelled.   Marland Kitchen HTN (hypertension)   . PVC (premature ventricular contraction)   . Valvular heart disease     Moderate to severe MR, moderate TR, LAE per echo 7/11  . Spinal stenosis   . Abdominal pain   . History of glaucoma   . Non-healing surgical wound     ABDOMINAL S/P EXPLORATOY LAPAROTOMY 04-18-2011  . History of small bowel obstruction 2009 AND NOV 2012  . Peptic ulcer disease   . GERD (gastroesophageal reflux disease)   . Chronic systolic CHF (congestive heart failure), NYHA class 2     EF 25% 2011 MV,  50-55% echo 6/13  . Moderate mitral regurgitation   . Moderate tricuspid regurgitation   . Arthritis     FEET, KNEES, HANDS  . H/O hiatal hernia   . Urge urinary incontinence   . Left ovarian cyst   . Diabetes mellitus type 2, diet-controlled   . Shortness of breath   . Wears dentures     upper denture-lower partial  . Hypothyroidism   . Anxiety     maintained on Xanax tid for years.  . Glaucoma     s/p surgery B in 12s.  [place].   Past Surgical History  Procedure Laterality Date  . Tubal ligation    .  Glaucoma surgery  1978  . Exploratomy lap. / extensive lysis adhesions/ small bowel resection x2 with primary anastomosis x2  04-18-2011    SMALL BOWEL OBSTRUCTION  . Knee arthroscopy w/ meniscectomy  06-05-2004  . Transthoracic echocardiogram  12-27-2010    EF 45-50%/ MODERATE MV REGURG. / LEFT ATRIUM MILDLY DILATED/ MILD TRICUSPID REGURG.  . Cardiovascular stress test  JULY 2011-  DR HOCHREIN    NO ISCHEMIA  . Thyroidectomy  1978 GOITOR    AND CERVICAL COLD KNIFE CONE  BX  . Tonsillectomy  AGE 34  . Benign right breast tumor removed    . Appendectomy  1979    RUPTURED  . Abdominal hernia repair  92 YRS AGO    PERITINITIS  . Ventral hernia repair  X4  LAST ONE 20 YRS AGO    ABDOMINAL --  EACH ONE IN DIFFERENT AREA  . Wound debridement  09/25/2011    Procedure: DEBRIDEMENT CLOSURE/ABDOMINAL WOUND;  Surgeon: Theodoro Kos, DO;  Location: [facility];  Service: Plastics;  Laterality: N/A;  excision of abdominal wound with primary closure  . Ventral hernia repair  01/29/2012    Procedure: HERNIA REPAIR VENTRAL ADULT;  Surgeon: Theodoro Kos, DO;  Location: [facility];  Service: Plastics;  Laterality: N/A;  . Hernia repair    . Incision and drainage of wound N/A 09/13/2012    Procedure: INCISION  AND DEBRIDEMENT WOUND abdominal wall ;  Surgeon: Rolm Bookbinder, MD;  Location: [facility];  Service: General;  Laterality: N/A;  coordination with Dr Migdalia Dk    Allergies  Allergen Reactions  . Aspirin Other (See Comments)    HX Bleeding Ulcer   . Nsaids     Hx of bleeding ulcers  . Tolmetin Rash    Hx of bleeding ulcers   Current Outpatient Prescriptions  Medication Sig Dispense Refill  . ALPRAZolam (XANAX) 0.25 MG tablet Take 1 tablet (0.25 mg total) by mouth 3 (three) times daily as needed for anxiety. 90 tablet 5  . carvedilol (COREG) 12.5 MG tablet TAKE 1 TABLET TWICE DAILY WITH A MEAL. 60 tablet 11  . furosemide (LASIX) 40 MG tablet prn 30 tablet 11  . HYDROcodone-acetaminophen (NORCO/VICODIN) 5-325 MG per tablet Take 1-2 tablets by mouth every 4 (four) hours as needed. 40 tablet 0  . levothyroxine (SYNTHROID, LEVOTHROID) 88 MCG tablet Take 1 tablet (88 mcg total) by mouth daily before breakfast. 30 tablet 11  . losartan (COZAAR) 25 MG tablet TAKE 1 TABLET DAILY. 30 tablet 11  . spironolactone (ALDACTONE) 25 MG tablet Take 0.5 tablets (12.5 mg total) by mouth at bedtime. 30 tablet 11   No current facility-administered  medications for this visit.       Objective:    BP 140/60 mmHg  Pulse 91  Temp(Src) 98.3 F (36.8 C) (Oral)  Resp 16  Ht 5\' 3"  (1.6 m)  Wt 183 lb 12.8 oz (83.371 kg)  BMI 32.57 kg/m2  SpO2 94% Physical Exam  Constitutional: She is oriented to person, place, and time. She appears well-developed and well-nourished. No distress.  obese  HENT:  Head: Normocephalic and atraumatic.  Right Ear: External ear normal.  Left Ear: External ear normal.  Nose: Nose normal.  Mouth/Throat: Oropharynx is clear and moist.  Eyes: Conjunctivae and EOM are normal. Pupils are equal, round, and reactive to light.  Neck: Normal range of motion. Neck supple. Carotid bruit is not present. No thyromegaly present.  Cardiovascular: Normal rate, regular rhythm, normal heart  sounds and intact distal pulses.  Exam reveals no gallop and no friction rub.   No murmur heard. Pulmonary/Chest: Effort normal and breath sounds normal. She has no wheezes. She has no rales.  Abdominal: Soft. Bowel sounds are normal. She exhibits no distension and no mass. There is no tenderness. There is no rebound and no guarding.  Musculoskeletal:       Left shoulder: She exhibits decreased range of motion and pain. She exhibits no tenderness, no bony tenderness, normal pulse and normal strength.       Cervical back: Normal. She exhibits normal range of motion, no tenderness and no bony tenderness.  Lymphadenopathy:    She has no cervical adenopathy.  Neurological: She is alert and oriented to person, place, and time. No cranial nerve deficit.  Skin: Skin is warm and dry. No rash noted. She is not diaphoretic. No erythema. No pallor.  Psychiatric: She has a normal mood and affect. Her behavior is normal.    [facility] reading (PRIMARY) by  Dr. Tamala Julian.  R SHOULDER: NAD      Assessment & Plan:   1. Generalized anxiety disorder   2. Glucose intolerance (impaired glucose tolerance)   3. Hypothyroidism due to acquired atrophy of thyroid    4. Pain in joint, shoulder region, right   5. Nonischemic cardiomyopathy   6. Essential hypertension   7. Pure hypercholesterolemia   8. Hearing loss, right     1.  Generalized anxiety disorder: worsening due to son's decline in health; refill of Xanax provided; has been maintained on current regimen for years. 2.  Glucose intolerance: stable; obtain labs; continue with dietary modification.  3. Hypothyroidism: controlled; obtain labs; continue current medications. 4.  R shoulder pain: New.  Recommend home exercise program to perform daily; if persists, refer to ortho. 5.  Cardiomyopathy: stable; euvolemic at this time; followed by cardiology annually. 6.  HTN: controlled; obtain labs; continue current medications. 7. Hypercholesterolemia: uncontrolled; pt refuses statin therapy; obtain labs. 8.  R hearing loss: persistent; pt declined referral to ENT at this time.   Meds ordered this encounter  Medications  . ALPRAZolam (XANAX) 0.25 MG tablet    Sig: Take 1 tablet (0.25 mg total) by mouth 3 (three) times daily as needed for anxiety.    Dispense:  90 tablet    Refill:  5    Office visit needed for additional refills.    Return in about 6 months (around 03/02/2015) for complete physical examiniation.     Vanessia Bokhari Elayne Guerin, M.D. [facility] [address] [place], [place]  [address] [phone] phone [phone] fax

## 2014-08-31 LAB — HEMOGLOBIN A1C
Hgb A1c MFr Bld: 5.8 % — ABNORMAL HIGH (ref <5.7)
MEAN PLASMA GLUCOSE: 120 mg/dL — AB (ref <117)

## 2014-09-11 ENCOUNTER — Telehealth: Payer: Self-pay | Admitting: Cardiology

## 2014-09-11 NOTE — Telephone Encounter (Signed)
New Message       Pt calling stating that she is experiencing extreme fatigue and wants to know what she should do until she is able to see Truitt Merle. Please call back and advise.

## 2014-09-11 NOTE — Telephone Encounter (Signed)
Pt recently seen by PCP for annual visit.  She had been recommended to schedule OV w/ [place], on concerns of increased fatigue.  This has been persistent over a period of ~2 months. Pt reports waking up fatigued, wanting to sit down all the time. Dyspnea on walking/exertion. She notes no recent med chgs.  Labwork on 3/16 shows normal TSH, CBC, CMET.   She does report being under significant stress due to son's hospitalization.  Pt has appt w/ Truitt Merle at end of April.   Routing to Dr. Percival Spanish to advise if needed to be seen sooner or additional recommendations.

## 2014-09-13 NOTE — Telephone Encounter (Signed)
OK to keep appt as scheduled in April.

## 2014-09-14 NOTE — Telephone Encounter (Signed)
Called pt, updated her on Dr. Rosezella Florida advice. Pt voiced understanding.

## 2014-10-10 NOTE — Progress Notes (Signed)
CARDIOLOGY OFFICE NOTE  Date:  10/11/2014    Teresa [name] Date of Birth: 1940/10/29 Medical Record [id number]  PCP:  Reginia Forts, MD  Cardiologist:  [facility]    Chief Complaint  Patient presents with  . Fatigue    Work in visit - seen for Dr. Percival Spanish  . Shortness of Breath     History of Present Illness: Teresa [name] is a 74 y.o. female who presents today for a work in visit. Seen for Dr. Percival Spanish - former patient of Dr. Susa Simmonds. Has a nonischemic CM. Past EF down to 25%. EF improved with medicines. Plans for an ICD in the past were cancelled. Other issues include tobacco abuse, HTN, PVCs, DM, valvular heart disease, spinal stenosis and glaucoma. Had a small bowel obstruction back in November of 2012 and had surgery with delayed wound healing.   Seen over 2 years ago. Was complaining of dyspnea. Echo was updated. EF was normal. She had stopped smoking. I last saw her in September of 2015 - she was doing ok - struggling with her weight, not very active but appeared stable.   Referred back here by PCP for an approximate 2 month history of fatigue and DOE.   Comes in today. Here alone. Lots of stress with her son that has an ICD - he is still hospitalized recently with multiple ICD discharges. Feels helpless in his regards. No chest pain. She is taking her medicines. She is very fatigued. No energy. She is more short of breath with just minimal activity. She admits that she is depressed. Hurts a lot due to her joints.   Past Medical History  Diagnosis Date  . Nonischemic cardiomyopathy DR [facility]     EF is 25% per echo February 2012, non ischemic myoview 12/2009.  EF 50% echo 7/12 and plans for ICD cancelled.   Marland Kitchen HTN (hypertension)   . PVC (premature ventricular contraction)   . Valvular heart disease     Moderate to severe MR, moderate TR, LAE per echo 7/11  . Spinal stenosis   . Abdominal pain   . History of glaucoma   . Non-healing surgical wound     ABDOMINAL  S/P EXPLORATOY LAPAROTOMY 04-18-2011  . History of small bowel obstruction 2009 AND NOV 2012  . Peptic ulcer disease   . GERD (gastroesophageal reflux disease)   . Chronic systolic CHF (congestive heart failure), NYHA class 2     EF 25% 2011 MV,  50-55% echo 6/13  . Moderate mitral regurgitation   . Moderate tricuspid regurgitation   . Arthritis     FEET, KNEES, HANDS  . H/O hiatal hernia   . Urge urinary incontinence   . Left ovarian cyst   . Diabetes mellitus type 2, diet-controlled   . Shortness of breath   . Wears dentures     upper denture-lower partial  . Hypothyroidism   . Anxiety     maintained on Xanax tid for years.  . Glaucoma     s/p surgery B in 65s.  [place].    Past Surgical History  Procedure Laterality Date  . Tubal ligation    . Glaucoma surgery  1978  . Exploratomy lap. / extensive lysis adhesions/ small bowel resection x2 with primary anastomosis x2  04-18-2011    SMALL BOWEL OBSTRUCTION  . Knee arthroscopy w/ meniscectomy  06-05-2004  . Transthoracic echocardiogram  12-27-2010    EF 45-50%/ MODERATE MV REGURG. / LEFT ATRIUM MILDLY DILATED/ MILD TRICUSPID REGURG.  Marland Kitchen  Cardiovascular stress test  JULY 2011-  DR HOCHREIN    NO ISCHEMIA  . Thyroidectomy  1978 GOITOR    AND CERVICAL COLD KNIFE CONE BX  . Tonsillectomy  AGE 48  . Benign right breast tumor removed    . Appendectomy  1979    RUPTURED  . Abdominal hernia repair  34 YRS AGO    PERITINITIS  . Ventral hernia repair  X4  LAST ONE 20 YRS AGO    ABDOMINAL --  EACH ONE IN DIFFERENT AREA  . Wound debridement  09/25/2011    Procedure: DEBRIDEMENT CLOSURE/ABDOMINAL WOUND;  Surgeon: Theodoro Kos, DO;  Location: [facility];  Service: Plastics;  Laterality: N/A;  excision of abdominal wound with primary closure  . Ventral hernia repair  01/29/2012    Procedure: HERNIA REPAIR VENTRAL ADULT;  Surgeon: Theodoro Kos, DO;  Location: [facility];  Service: Plastics;  Laterality: N/A;   . Hernia repair    . Incision and drainage of wound N/A 09/13/2012    Procedure: INCISION  AND DEBRIDEMENT WOUND abdominal wall ;  Surgeon: Rolm Bookbinder, MD;  Location: [facility];  Service: General;  Laterality: N/A;  coordination with Dr Migdalia Dk      Medications: Current Outpatient Prescriptions  Medication Sig Dispense Refill  . ALPRAZolam (XANAX) 0.25 MG tablet Take 1 tablet (0.25 mg total) by mouth 3 (three) times daily as needed for anxiety. 90 tablet 5  . carvedilol (COREG) 12.5 MG tablet TAKE 1 TABLET TWICE DAILY WITH A MEAL. 60 tablet 11  . furosemide (LASIX) 40 MG tablet prn 30 tablet 11  . HYDROcodone-acetaminophen (NORCO/VICODIN) 5-325 MG per tablet Take 1-2 tablets by mouth every 4 (four) hours as needed. 40 tablet 0  . levothyroxine (SYNTHROID, LEVOTHROID) 88 MCG tablet Take 1 tablet (88 mcg total) by mouth daily before breakfast. 30 tablet 11  . losartan (COZAAR) 25 MG tablet TAKE 1 TABLET DAILY. 30 tablet 11  . spironolactone (ALDACTONE) 25 MG tablet Take 0.5 tablets (12.5 mg total) by mouth at bedtime. 30 tablet 11   No current facility-administered medications for this visit.    Allergies: Allergies  Allergen Reactions  . Aspirin Other (See Comments)    HX Bleeding Ulcer   . Nsaids     Hx of bleeding ulcers  . Tolmetin Rash    Hx of bleeding ulcers    Social History: The patient  reports that she quit smoking about 4 years ago. Her smoking use included Cigarettes. She quit after 30 years of use. She has never used smokeless tobacco. She reports that she does not drink alcohol or use illicit drugs.   Family History: The patient's family history includes Heart disease in her sister; Stroke in her brother and sister.   Review of Systems: Please see the history of present illness.   Otherwise, the review of systems is positive for fatigue and DOE.   All other systems are reviewed and negative.   Physical Exam: VS:  BP 130/90 mmHg  Pulse 83  Resp 20  Ht 5\' 3"   (1.6 m)  Wt 188 lb (85.276 kg)  BMI 33.31 kg/m2  SpO2 96% .  BMI Body mass index is 33.31 kg/(m^2).  Wt Readings from Last 3 Encounters:  10/11/14 188 lb (85.276 kg)  08/30/14 183 lb 12.8 oz (83.371 kg)  03/01/14 183 lb (83.008 kg)    General: Pleasant. Well developed, well nourished and in no acute distress.  HEENT: Normal. Neck: Supple, no JVD,  carotid bruits, or masses noted.  Cardiac: Regular rate and rhythm. No murmurs, rubs, or gallops. Trace edema.  Respiratory:  Lungs are clear to auscultation bilaterally with normal work of breathing.  GI: Soft and nontender.  MS: No deformity or atrophy. Gait and ROM intact. Skin: Warm and dry. Color is normal.  Neuro:  Strength and sensation are intact and no gross focal deficits noted.  Psych: Alert, appropriate and with normal affect. She does seem a bit depressed to me today.    LABORATORY DATA:  EKG:  EKG is ordered today. This demonstrates NSR with PVC  Lab Results  Component Value Date   WBC 5.5 08/30/2014   HGB 12.8 08/30/2014   HCT 39.0 08/30/2014   PLT 191 08/30/2014   GLUCOSE 123* 08/30/2014   CHOL 216* 08/30/2014   TRIG 188* 08/30/2014   HDL 35* 08/30/2014   LDLDIRECT 182.5 09/12/2010   LDLCALC 143* 08/30/2014   ALT 17 08/30/2014   AST 18 08/30/2014   NA 137 08/30/2014   K 5.0 08/30/2014   CL 106 08/30/2014   CREATININE 1.01 08/30/2014   BUN 19 08/30/2014   CO2 23 08/30/2014   TSH 1.490 08/30/2014   INR 1.08 02/04/2013   HGBA1C 5.8* 08/30/2014   MICROALBUR 0.51 03/01/2014    BNP (last 3 results) No results for input(s): BNP in the last 8760 hours.  ProBNP (last 3 results) No results for input(s): PROBNP in the last 8760 hours.   Other Studies Reviewed Today:  Echo Study Conclusions from June 2013  - Left ventricle: The cavity size was normal. Wall thickness was increased in a pattern of mild LVH. Systolic function was normal. The estimated ejection fraction was in the range of 50% to 55%. Wall  motion was normal; there were no regional wall motion abnormalities. Doppler parameters are consistent with abnormal left ventricular relaxation (grade 1 diastolic dysfunction). - Aortic valve: Mild regurgitation. - Mitral valve: Mild regurgitation. - Left atrium: The atrium was mildly dilated. - Pulmonary arteries: Systolic pressure was mildly increased. PA peak pressure: 37mm Hg (S).  Assessment / Plan:  1. Nonischemic CM - EF per echo in 2013 was at 50 to 55%. Now with more DOE and fatigue - will check BNP and update echo. Send for CXR. If all this is ok - may need PFTs and referral to pulmonary. I wonder how much depression is playing a role here.   2. Tobacco abuse - not smoking   3. Obesity - Encouraged activity.  4. Fatigue - has had normal TSH by PCP   Current medicines are reviewed with the patient today.  The patient does not have concerns regarding medicines other than what has been noted above.  The following changes have been made:  See above.  Labs/ tests ordered today include:    Orders Placed This Encounter  Procedures  . DG Chest 2 View  . Brain natriuretic peptide  . EKG 12-Lead  . 2D Echocardiogram without contrast     Disposition:   FU with me based on testing results.   Patient is agreeable to this plan and will call if any problems develop in the interim.   Signed: Burtis Junes, RN, ANP-C 10/11/2014 11:45 AM  [facility] [address] [place], [place]  [address] Phone: [phone] Fax: [phone]

## 2014-10-11 ENCOUNTER — Ambulatory Visit (INDEPENDENT_AMBULATORY_CARE_PROVIDER_SITE_OTHER): Payer: Medicare Other | Admitting: Nurse Practitioner

## 2014-10-11 ENCOUNTER — Encounter: Payer: Self-pay | Admitting: Nurse Practitioner

## 2014-10-11 VITALS — BP 130/90 | HR 83 | Resp 20 | Ht 63.0 in | Wt 188.0 lb

## 2014-10-11 DIAGNOSIS — I428 Other cardiomyopathies: Secondary | ICD-10-CM

## 2014-10-11 DIAGNOSIS — R0600 Dyspnea, unspecified: Secondary | ICD-10-CM

## 2014-10-11 DIAGNOSIS — R5383 Other fatigue: Secondary | ICD-10-CM | POA: Diagnosis not present

## 2014-10-11 DIAGNOSIS — I429 Cardiomyopathy, unspecified: Secondary | ICD-10-CM

## 2014-10-11 DIAGNOSIS — R0609 Other forms of dyspnea: Secondary | ICD-10-CM

## 2014-10-11 DIAGNOSIS — I420 Dilated cardiomyopathy: Secondary | ICD-10-CM

## 2014-10-11 LAB — BRAIN NATRIURETIC PEPTIDE: Pro B Natriuretic peptide (BNP): 72 pg/mL (ref 0.0–100.0)

## 2014-10-11 NOTE — Patient Instructions (Addendum)
We will be checking the following labs today - BN{   Medication Instructions:    Continue with your current medicines.     Testing/Procedures To Be Arranged:  Echocardiogram this week Please go to [facility] to [place] on the first floor for a chest Xray - you may walk in.    Follow-Up:  We will see you what your tests show first - may need to send you for a breathing test.   Other Special Instructions:   N/A  Call the [facility] office at [phone] if you have any questions, problems or concerns.

## 2014-10-13 ENCOUNTER — Ambulatory Visit (HOSPITAL_COMMUNITY): Payer: Medicare Other | Attending: Cardiovascular Disease | Admitting: Cardiology

## 2014-10-13 ENCOUNTER — Other Ambulatory Visit: Payer: Self-pay | Admitting: Nurse Practitioner

## 2014-10-13 DIAGNOSIS — I429 Cardiomyopathy, unspecified: Secondary | ICD-10-CM

## 2014-10-13 DIAGNOSIS — I420 Dilated cardiomyopathy: Secondary | ICD-10-CM | POA: Diagnosis not present

## 2014-10-13 DIAGNOSIS — R0600 Dyspnea, unspecified: Secondary | ICD-10-CM

## 2014-10-13 DIAGNOSIS — I428 Other cardiomyopathies: Secondary | ICD-10-CM

## 2014-10-13 DIAGNOSIS — R0609 Other forms of dyspnea: Secondary | ICD-10-CM | POA: Diagnosis not present

## 2014-10-13 DIAGNOSIS — R5383 Other fatigue: Secondary | ICD-10-CM

## 2014-10-13 NOTE — Progress Notes (Signed)
Echo performed. 

## 2015-02-12 ENCOUNTER — Ambulatory Visit (INDEPENDENT_AMBULATORY_CARE_PROVIDER_SITE_OTHER): Payer: Medicare Other | Admitting: Family Medicine

## 2015-02-12 ENCOUNTER — Encounter: Payer: Self-pay | Admitting: Family Medicine

## 2015-02-12 VITALS — BP 135/78 | HR 79 | Temp 98.0°F | Resp 16 | Ht 63.0 in | Wt 192.2 lb

## 2015-02-12 DIAGNOSIS — E669 Obesity, unspecified: Secondary | ICD-10-CM

## 2015-02-12 DIAGNOSIS — I10 Essential (primary) hypertension: Secondary | ICD-10-CM | POA: Diagnosis not present

## 2015-02-12 DIAGNOSIS — R7302 Impaired glucose tolerance (oral): Secondary | ICD-10-CM | POA: Diagnosis not present

## 2015-02-12 DIAGNOSIS — K219 Gastro-esophageal reflux disease without esophagitis: Secondary | ICD-10-CM

## 2015-02-12 DIAGNOSIS — E034 Atrophy of thyroid (acquired): Secondary | ICD-10-CM | POA: Diagnosis not present

## 2015-02-12 DIAGNOSIS — N181 Chronic kidney disease, stage 1: Secondary | ICD-10-CM

## 2015-02-12 DIAGNOSIS — M170 Bilateral primary osteoarthritis of knee: Secondary | ICD-10-CM | POA: Diagnosis not present

## 2015-02-12 DIAGNOSIS — I429 Cardiomyopathy, unspecified: Secondary | ICD-10-CM | POA: Diagnosis not present

## 2015-02-12 DIAGNOSIS — E038 Other specified hypothyroidism: Secondary | ICD-10-CM

## 2015-02-12 DIAGNOSIS — F411 Generalized anxiety disorder: Secondary | ICD-10-CM | POA: Diagnosis not present

## 2015-02-12 DIAGNOSIS — I428 Other cardiomyopathies: Secondary | ICD-10-CM

## 2015-02-12 DIAGNOSIS — K439 Ventral hernia without obstruction or gangrene: Secondary | ICD-10-CM | POA: Diagnosis not present

## 2015-02-12 LAB — CBC WITH DIFFERENTIAL/PLATELET
BASOS ABS: 0.1 10*3/uL (ref 0.0–0.1)
Basophils Relative: 1 % (ref 0–1)
EOS PCT: 3 % (ref 0–5)
Eosinophils Absolute: 0.2 10*3/uL (ref 0.0–0.7)
HCT: 37.8 % (ref 36.0–46.0)
HEMOGLOBIN: 12.7 g/dL (ref 12.0–15.0)
LYMPHS ABS: 2 10*3/uL (ref 0.7–4.0)
Lymphocytes Relative: 34 % (ref 12–46)
MCH: 31.9 pg (ref 26.0–34.0)
MCHC: 33.6 g/dL (ref 30.0–36.0)
MCV: 95 fL (ref 78.0–100.0)
MONO ABS: 0.4 10*3/uL (ref 0.1–1.0)
MPV: 9.6 fL (ref 8.6–12.4)
Monocytes Relative: 7 % (ref 3–12)
NEUTROS ABS: 3.3 10*3/uL (ref 1.7–7.7)
Neutrophils Relative %: 55 % (ref 43–77)
Platelets: 203 10*3/uL (ref 150–400)
RBC: 3.98 MIL/uL (ref 3.87–5.11)
RDW: 13 % (ref 11.5–15.5)
WBC: 6 10*3/uL (ref 4.0–10.5)

## 2015-02-12 LAB — T4, FREE: Free T4: 1.21 ng/dL (ref 0.80–1.80)

## 2015-02-12 LAB — TSH: TSH: 0.287 u[IU]/mL — ABNORMAL LOW (ref 0.350–4.500)

## 2015-02-12 MED ORDER — ALPRAZOLAM 0.25 MG PO TABS: 0.2500 mg | ORAL_TABLET | Freq: Three times a day (TID) | ORAL | Status: DC | PRN

## 2015-02-12 NOTE — Progress Notes (Signed)
Subjective:    Patient ID: Teresa [name], female    DOB: 1940-12-18, 74 y.o.   MRN: [id number]  02/12/2015  Medication Refill and Anxiety   HPI This 74 y.o. female presents for six month follow-up for the following:   1. Hypertension:  Patient reports good compliance with medication, good tolerance to medication, and good symptom control.  Denies CP/SOB/leg swelling.    2.  Hypothyroidism: Patient reports good compliance with medication, good tolerance to medication, and good symptom control.    3.  Anxiety: Patient reports good compliance with medication, good tolerance to medication, and good symptom control.  Son died since last visit.  Son with cardiomyopathy and heart failure with defibrillator.  Coping well with death and loss of son; having to deal with his estate; good support from living son, Teresa [name].    4.  Glucose intolerance: admits to eating dessert every night; has gained a lot of weight in past three years; retired last year and less active.  5. Cardiomyopathy: followed by cardiology annually; last visit 09/2014; BNP 72 which is WNL.  6.  OA: followed by pain management every two months.  7. Obesity: has gained 50 pounds in past three years.  Eats sweets at bedtime.  Eats 8 cookies at a time.    8. Abdominal distention: has diarrhea a lot; twice today; then will have normal bowel movement; then will have mixed stools.  Having 3-4 stools per day.  Appointment today with realtor today.  No recent follow-up with surgeon.  Has gained weight.  Large hernia now; no severe pain; only mild discomfort.  Will get cramping abdominal pain.  Will have 1-2 stools every morning; then has 1-2 stools two hours after lunch.  Last colonoscopy years ago.  Afraid to undergo repeat colonoscopy.  No bloody or black stools.  Has tried abdominal bands but too hot; tries to get supportive Spanx/Latex with some help.   Review of Systems  Constitutional: Negative for fever, chills, diaphoresis and  fatigue.  Eyes: Negative for visual disturbance.  Respiratory: Negative for cough and shortness of breath.   Cardiovascular: Negative for chest pain, palpitations and leg swelling.  Gastrointestinal: Positive for abdominal distention. Negative for nausea, vomiting, abdominal pain, diarrhea, constipation, blood in stool, anal bleeding and rectal pain.  Endocrine: Negative for cold intolerance, heat intolerance, polydipsia, polyphagia and polyuria.  Skin: Negative for rash.  Neurological: Negative for dizziness, tremors, seizures, syncope, facial asymmetry, speech difficulty, weakness, light-headedness, numbness and headaches.  Psychiatric/Behavioral: Positive for dysphoric mood. Negative for suicidal ideas, sleep disturbance and self-injury. The patient is nervous/anxious.     Past Medical History  Diagnosis Date  . Nonischemic cardiomyopathy [facility]     EF is 25% per echo February 2012, non ischemic myoview 12/2009.  EF 50% echo 7/12 and plans for ICD cancelled.   Marland Kitchen HTN (hypertension)   . PVC (premature ventricular contraction)   . Valvular heart disease     Moderate to severe MR, moderate TR, LAE per echo 7/11  . Spinal stenosis   . Abdominal pain   . History of glaucoma   . Non-healing surgical wound     ABDOMINAL S/P EXPLORATOY LAPAROTOMY 04-18-2011  . History of small bowel obstruction 2009 AND NOV 2012  . Peptic ulcer disease   . GERD (gastroesophageal reflux disease)   . Chronic systolic CHF (congestive heart failure), NYHA class 2     EF 25% 2011 MV,  50-55% echo 6/13  . Moderate mitral regurgitation   .  Moderate tricuspid regurgitation   . Arthritis     FEET, KNEES, HANDS  . H/O hiatal hernia   . Urge urinary incontinence   . Left ovarian cyst   . Diabetes mellitus type 2, diet-controlled   . Shortness of breath   . Wears dentures     upper denture-lower partial  . Hypothyroidism   . Anxiety     maintained on Xanax tid for years.  . Glaucoma     s/p surgery B in  20s.  [place].   Past Surgical History  Procedure Laterality Date  . Tubal ligation    . Glaucoma surgery  1978  . Exploratomy lap. / extensive lysis adhesions/ small bowel resection x2 with primary anastomosis x2  04-18-2011    SMALL BOWEL OBSTRUCTION  . Knee arthroscopy w/ meniscectomy  06-05-2004  . Transthoracic echocardiogram  12-27-2010    EF 45-50%/ MODERATE MV REGURG. / LEFT ATRIUM MILDLY DILATED/ MILD TRICUSPID REGURG.  . Cardiovascular stress test  JULY 2011-  DR HOCHREIN    NO ISCHEMIA  . Thyroidectomy  1978 GOITOR    AND CERVICAL COLD KNIFE CONE BX  . Tonsillectomy  AGE 58  . Benign right breast tumor removed    . Appendectomy  1979    RUPTURED  . Abdominal hernia repair  70 YRS AGO    PERITINITIS  . Ventral hernia repair  X4  LAST ONE 20 YRS AGO    ABDOMINAL --  EACH ONE IN DIFFERENT AREA  . Wound debridement  09/25/2011    Procedure: DEBRIDEMENT CLOSURE/ABDOMINAL WOUND;  Surgeon: Theodoro Kos, DO;  Location: [place];  Service: Plastics;  Laterality: N/A;  excision of abdominal wound with primary closure  . Ventral hernia repair  01/29/2012    Procedure: HERNIA REPAIR VENTRAL ADULT;  Surgeon: Theodoro Kos, DO;  Location: [place];  Service: Plastics;  Laterality: N/A;  . Hernia repair    . Incision and drainage of wound N/A 09/13/2012    Procedure: INCISION  AND DEBRIDEMENT WOUND abdominal wall ;  Surgeon: Rolm Bookbinder, MD;  Location: [place];  Service: General;  Laterality: N/A;  coordination with Dr Migdalia Dk    Allergies  Allergen Reactions  . Aspirin Other (See Comments)    HX Bleeding Ulcer   . Nsaids     Hx of bleeding ulcers  . Tolmetin Rash    Hx of bleeding ulcers   Current Outpatient Prescriptions  Medication Sig Dispense Refill  . ALPRAZolam (XANAX) 0.25 MG tablet Take 1 tablet (0.25 mg total) by mouth 3 (three) times daily as needed for anxiety. 90 tablet 5  . carvedilol (COREG) 12.5 MG tablet TAKE 1 TABLET TWICE  DAILY WITH A MEAL. 60 tablet 11  . HYDROcodone-acetaminophen (NORCO/VICODIN) 5-325 MG per tablet Take 1-2 tablets by mouth every 4 (four) hours as needed. 40 tablet 0  . levothyroxine (SYNTHROID, LEVOTHROID) 88 MCG tablet Take 1 tablet (88 mcg total) by mouth daily before breakfast. 30 tablet 11  . losartan (COZAAR) 25 MG tablet TAKE 1 TABLET DAILY. 30 tablet 11  . spironolactone (ALDACTONE) 25 MG tablet Take 0.5 tablets (12.5 mg total) by mouth at bedtime. 30 tablet 11  . furosemide (LASIX) 40 MG tablet prn (Patient not taking: Reported on 02/12/2015) 30 tablet 11   No current facility-administered medications for this visit.   Social History   Social History  . Marital Status: Widowed    Spouse Name: N/A  . Number of Children: N/A  .  Years of Education: N/A   Occupational History  . Not on file.   Social History Main Topics  . Smoking status: Former Smoker -- 30 years    Types: Cigarettes    Quit date: 09/22/2010  . Smokeless tobacco: Never Used     Comment: quit 2011  . Alcohol Use: No  . Drug Use: No  . Sexual Activity: Not Currently   Other Topics Concern  . Not on file   Social History Narrative   Marital status:  Widowed as of 2012; not dating.        Children: four children; six grandchildren; 5 gg.      Lives: alone with a yorkie.      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      Tobacco:  None; quit in 2012.        Alcohol: none      Drugs:none      Exercise:  No formal exercise program.  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  Cannot swim.        ADLs:  Driving; cleans own house; does grocery shopping; pays bills; does not use cane for ambulation.      Advanced Directives: no living will; no HCPOA.  DNR/DNI.         Objective:    BP 135/78 mmHg  Pulse 79  Temp(Src) 98 F (36.7 C) (Oral)  Resp 16  Ht 5\' 3"  (1.6 m)  Wt 192 lb 3.2 oz (87.181 kg)  BMI 34.06 kg/m2 Physical Exam  Constitutional: She is oriented  to person, place, and time. She appears well-developed and well-nourished. No distress.  obese  HENT:  Head: Normocephalic and atraumatic.  Right Ear: External ear normal.  Left Ear: External ear normal.  Nose: Nose normal.  Mouth/Throat: Oropharynx is clear and moist.  Eyes: Conjunctivae and EOM are normal. Pupils are equal, round, and reactive to light.  Neck: Normal range of motion. Neck supple. Carotid bruit is not present. No thyromegaly present.  Cardiovascular: Normal rate, regular rhythm, normal heart sounds and intact distal pulses.  Exam reveals no gallop and no friction rub.   No murmur heard. Pulmonary/Chest: Effort normal and breath sounds normal. She has no wheezes. She has no rales.  Abdominal: Soft. Bowel sounds are normal. She exhibits distension. She exhibits no mass. There is no hepatosplenomegaly. There is no tenderness. There is no rebound, no guarding and no CVA tenderness.    Incisional hernia as outlined; also large hernia in RLQ; +abdominal protuberance but non-tender.  Lymphadenopathy:    She has no cervical adenopathy.  Neurological: She is alert and oriented to person, place, and time. No cranial nerve deficit.  Skin: Skin is warm and dry. No rash noted. She is not diaphoretic. No erythema. No pallor.  Psychiatric: She has a normal mood and affect. Her behavior is normal.   Results for orders placed or performed in visit on 10/11/14  Brain natriuretic peptide  Result Value Ref Range   Pro B Natriuretic peptide (BNP) 72.0 0.0 - 100.0 pg/mL       Assessment & Plan:   1. Essential hypertension   2. Generalized anxiety disorder   3. Gastroesophageal reflux disease without esophagitis   4. Chronic renal insufficiency, stage 1   5. Primary osteoarthritis of both knees   6. Glucose intolerance (impaired glucose tolerance)   7. Hypothyroidism due to acquired atrophy of thyroid     1. HTN: controlled; obtain labs;  continue current medications. 2.   Generalized anxiety disorder: worsening with death of son; coping well; counseling provided during visit; good support from living son, Teresa [name]; refill of Xanax 0.25mg  tid PRN; has been maintained on this current dose for years; takes two at bedtime; sometimes will take third one.  3.  GERD: controlled; no change in medications. 4.  Chronic renal insufficiency: stable; obtain labs; avoid nephrotoxic medications. 5.  OA knees B and DDD lumbar spine: stable; followed by pain management. 6. Glucose Intolerance: with weight gain; obtain labs; encourage avoidance of daily sweets/desserts. 7. Hypothyroidism: stable; obtain labs; continue current medications. 8. Cardiomyopathy: stable; asymptomatic. 9. Abdominal distention: worsening with weight gain; known abdominal hernias; benign abdominal exam; having frequent daily stools which has been ongoing for years; pt refuses repeat colonoscopy at this time.  RTC for abdominal pain, diarrhea, bloody stools. 10. Obesity: worsening with recent death of son and with retirement; recommend weight loss and low-caloric food choices; exercise is limited with chronic pain syndrome and abdominal wall weakness.   Meds ordered this encounter  Medications  . ALPRAZolam (XANAX) 0.25 MG tablet    Sig: Take 1 tablet (0.25 mg total) by mouth 3 (three) times daily as needed for anxiety.    Dispense:  90 tablet    Refill:  5    Return in about 6 months (around 08/14/2015) for complete physical examiniation.    Acea Yagi Elayne Guerin, M.D. [facility] [address] [place], [place]  [address] [phone] phone [phone] fax

## 2015-02-13 LAB — COMPREHENSIVE METABOLIC PANEL
ALBUMIN: 4 g/dL (ref 3.6–5.1)
ALK PHOS: 69 U/L (ref 33–130)
ALT: 19 U/L (ref 6–29)
AST: 19 U/L (ref 10–35)
BILIRUBIN TOTAL: 0.4 mg/dL (ref 0.2–1.2)
BUN: 23 mg/dL (ref 7–25)
CO2: 21 mmol/L (ref 20–31)
Calcium: 9.1 mg/dL (ref 8.6–10.4)
Chloride: 109 mmol/L (ref 98–110)
Creat: 0.91 mg/dL (ref 0.60–0.93)
Glucose, Bld: 110 mg/dL — ABNORMAL HIGH (ref 65–99)
POTASSIUM: 5.2 mmol/L (ref 3.5–5.3)
Sodium: 142 mmol/L (ref 135–146)
Total Protein: 6.3 g/dL (ref 6.1–8.1)

## 2015-02-13 LAB — HEMOGLOBIN A1C
HEMOGLOBIN A1C: 5.8 % — AB (ref <5.7)
Mean Plasma Glucose: 120 mg/dL — ABNORMAL HIGH (ref <117)

## 2015-02-23 ENCOUNTER — Ambulatory Visit: Payer: Medicare Other | Admitting: Nurse Practitioner

## 2015-03-13 ENCOUNTER — Other Ambulatory Visit: Payer: Self-pay | Admitting: Family Medicine

## 2015-03-13 ENCOUNTER — Other Ambulatory Visit: Payer: Self-pay | Admitting: Nurse Practitioner

## 2015-03-19 DIAGNOSIS — K279 Peptic ulcer, site unspecified, unspecified as acute or chronic, without hemorrhage or perforation: Secondary | ICD-10-CM | POA: Insufficient documentation

## 2015-03-19 DIAGNOSIS — I5022 Chronic systolic (congestive) heart failure: Secondary | ICD-10-CM | POA: Insufficient documentation

## 2015-03-19 DIAGNOSIS — M4800 Spinal stenosis, site unspecified: Secondary | ICD-10-CM | POA: Insufficient documentation

## 2015-03-19 DIAGNOSIS — I38 Endocarditis, valve unspecified: Secondary | ICD-10-CM | POA: Insufficient documentation

## 2015-03-19 DIAGNOSIS — N3941 Urge incontinence: Secondary | ICD-10-CM | POA: Insufficient documentation

## 2015-03-19 DIAGNOSIS — T8189XA Other complications of procedures, not elsewhere classified, initial encounter: Secondary | ICD-10-CM | POA: Insufficient documentation

## 2015-03-19 DIAGNOSIS — Z8669 Personal history of other diseases of the nervous system and sense organs: Secondary | ICD-10-CM | POA: Insufficient documentation

## 2015-03-19 DIAGNOSIS — R0602 Shortness of breath: Secondary | ICD-10-CM | POA: Insufficient documentation

## 2015-03-19 DIAGNOSIS — E119 Type 2 diabetes mellitus without complications: Secondary | ICD-10-CM | POA: Insufficient documentation

## 2015-03-19 DIAGNOSIS — E039 Hypothyroidism, unspecified: Secondary | ICD-10-CM | POA: Insufficient documentation

## 2015-03-19 DIAGNOSIS — Z8719 Personal history of other diseases of the digestive system: Secondary | ICD-10-CM | POA: Insufficient documentation

## 2015-03-19 DIAGNOSIS — I340 Nonrheumatic mitral (valve) insufficiency: Secondary | ICD-10-CM | POA: Insufficient documentation

## 2015-03-19 DIAGNOSIS — Z972 Presence of dental prosthetic device (complete) (partial): Secondary | ICD-10-CM | POA: Insufficient documentation

## 2015-03-19 DIAGNOSIS — N83202 Unspecified ovarian cyst, left side: Secondary | ICD-10-CM | POA: Insufficient documentation

## 2015-03-19 DIAGNOSIS — I071 Rheumatic tricuspid insufficiency: Secondary | ICD-10-CM | POA: Insufficient documentation

## 2015-03-19 DIAGNOSIS — M1990 Unspecified osteoarthritis, unspecified site: Secondary | ICD-10-CM | POA: Insufficient documentation

## 2015-03-20 ENCOUNTER — Encounter: Payer: Self-pay | Admitting: Nurse Practitioner

## 2015-03-20 ENCOUNTER — Ambulatory Visit (INDEPENDENT_AMBULATORY_CARE_PROVIDER_SITE_OTHER): Payer: Medicare Other | Admitting: Nurse Practitioner

## 2015-03-20 VITALS — BP 140/90 | HR 91 | Ht 63.0 in | Wt 193.8 lb

## 2015-03-20 DIAGNOSIS — I428 Other cardiomyopathies: Secondary | ICD-10-CM

## 2015-03-20 DIAGNOSIS — R5383 Other fatigue: Secondary | ICD-10-CM

## 2015-03-20 DIAGNOSIS — I429 Cardiomyopathy, unspecified: Secondary | ICD-10-CM | POA: Diagnosis not present

## 2015-03-20 MED ORDER — FUROSEMIDE 40 MG PO TABS: 40.0000 mg | ORAL_TABLET | ORAL | Status: DC | PRN

## 2015-03-20 NOTE — Patient Instructions (Addendum)
We will be checking the following labs today - None  Medication Instructions:    Continue with your current medicines.   I refilled your Lasix today    Testing/Procedures To Be Arranged:  N/A  Follow-Up:   See me in 6 months  Call me if you want to proceed with your breathing test.     Other Special Instructions:   N/A  Call the [place] office at [phone] if you have any questions, problems or concerns.

## 2015-03-20 NOTE — Progress Notes (Signed)
CARDIOLOGY OFFICE NOTE  Date:  03/20/2015    Teresa [name] Date of Birth: [birth year] Medical Record [id number]  PCP:  Teresa Forts, MD  Cardiologist:  [facility]    Chief Complaint  Patient presents with  . Cardiomyopathy    Follow up vsiit - seen for Dr. Percival Spanish    History of Present Illness: Teresa [name] is a 74 y.o. female who presents today for a 6 month check. Seen for Dr. Percival Spanish - former patient of Dr. Susa Simmonds. Has a nonischemic CM. Past EF down to 25%. EF improved with medicines. Plans for an ICD in the past were cancelled due to recovery of her EF. Other issues include tobacco abuse, HTN, PVCs, DM, valvular heart disease, spinal stenosis and glaucoma. Had a small bowel obstruction back in November of 2012 and had surgery with delayed wound healing.   I saw her back in April - more weight gain - more short of breath. Updated her echo.This was ok. Referred for PFTs but looks like she has not completed.   Comes in today. Here alone. She has lost her son - he passed back in May. This has caused her a lot of grief. She has been trying to settle the estate. She did not get her PFTs due to all this. She is quite fatigued. She remains short of breath - always with exertion. She has gotten more sedentary. Weight continues to be an issue. Not dizzy or lightheaded. No chest pain.    Past Medical History  Diagnosis Date  . Nonischemic cardiomyopathy ([place]) [facility]     EF is 25% per echo February 2012, non ischemic myoview 12/2009.  EF 50% echo 7/12 and plans for ICD cancelled.   Marland Kitchen HTN (hypertension)   . PVC (premature ventricular contraction)   . Valvular heart disease     Moderate to severe MR, moderate TR, LAE per echo 7/11  . Spinal stenosis   . Abdominal pain   . History of glaucoma   . Non-healing surgical wound     ABDOMINAL S/P EXPLORATOY LAPAROTOMY 04-18-2011  . History of small bowel obstruction 2009 AND NOV 2012  . Peptic ulcer disease   . GERD  (gastroesophageal reflux disease)   . Chronic systolic CHF (congestive heart failure), NYHA class 2 ([place])     EF 25% 2011 MV,  50-55% echo 6/13  . Moderate mitral regurgitation   . Moderate tricuspid regurgitation   . Arthritis     FEET, KNEES, HANDS  . H/O hiatal hernia   . Urge urinary incontinence   . Left ovarian cyst   . Diabetes mellitus type 2, diet-controlled ([place])   . Shortness of breath   . Wears dentures     upper denture-lower partial  . Hypothyroidism   . Anxiety     maintained on Xanax tid for years.  . Glaucoma     s/p surgery B in 32s.  [place].    Past Surgical History  Procedure Laterality Date  . Tubal ligation    . Glaucoma surgery  1978  . Exploratomy lap. / extensive lysis adhesions/ small bowel resection x2 with primary anastomosis x2  04-18-2011    SMALL BOWEL OBSTRUCTION  . Knee arthroscopy w/ meniscectomy  06-05-2004  . Transthoracic echocardiogram  12-27-2010    EF 45-50%/ MODERATE MV REGURG. / LEFT ATRIUM MILDLY DILATED/ MILD TRICUSPID REGURG.  . Cardiovascular stress test  JULY 2011-  DR HOCHREIN    NO ISCHEMIA  . Thyroidectomy  1978 GOITOR    AND CERVICAL COLD KNIFE CONE BX  . Tonsillectomy  AGE 71  . Benign right breast tumor removed    . Appendectomy  1979    RUPTURED  . Abdominal hernia repair  32 YRS AGO    PERITINITIS  . Ventral hernia repair  X4  LAST ONE 20 YRS AGO    ABDOMINAL --  EACH ONE IN DIFFERENT AREA  . Wound debridement  09/25/2011    Procedure: DEBRIDEMENT CLOSURE/ABDOMINAL WOUND;  Surgeon: Theodoro Kos, DO;  Location: [place];  Service: Plastics;  Laterality: N/A;  excision of abdominal wound with primary closure  . Ventral hernia repair  01/29/2012    Procedure: HERNIA REPAIR VENTRAL ADULT;  Surgeon: Theodoro Kos, DO;  Location: [place];  Service: Plastics;  Laterality: N/A;  . Hernia repair    . Incision and drainage of wound N/A 09/13/2012    Procedure: INCISION  AND DEBRIDEMENT WOUND  abdominal wall ;  Surgeon: Rolm Bookbinder, MD;  Location: [place];  Service: General;  Laterality: N/A;  coordination with Dr Migdalia Dk      Medications: Current Outpatient Prescriptions  Medication Sig Dispense Refill  . ALPRAZolam (XANAX) 0.25 MG tablet Take 1 tablet (0.25 mg total) by mouth 3 (three) times daily as needed for anxiety. 90 tablet 5  . carvedilol (COREG) 12.5 MG tablet TAKE 1 TABLET TWICE DAILY WITH A MEAL. 60 tablet 9  . furosemide (LASIX) 40 MG tablet Take 1 tablet (40 mg total) by mouth as needed for fluid or edema. 30 tablet 11  . HYDROcodone-acetaminophen (NORCO/VICODIN) 5-325 MG per tablet Take 1-2 tablets by mouth every 4 (four) hours as needed. 40 tablet 0  . levothyroxine (SYNTHROID, LEVOTHROID) 88 MCG tablet TAKE  1 TABLET ONCE DAILY BEFORE BREAKFAST 30 tablet 5  . losartan (COZAAR) 25 MG tablet TAKE 1 TABLET DAILY. 30 tablet 11  . spironolactone (ALDACTONE) 25 MG tablet Take 0.5 tablets (12.5 mg total) by mouth at bedtime. 30 tablet 11   No current facility-administered medications for this visit.    Allergies: Allergies  Allergen Reactions  . Aspirin Other (See Comments)    HX Bleeding Ulcer   . Nsaids     Hx of bleeding ulcers  . Tolmetin Rash    Hx of bleeding ulcers    Social History: The patient  reports that she quit smoking about 4 years ago. Her smoking use included Cigarettes. She quit after 30 years of use. She has never used smokeless tobacco. She reports that she does not drink alcohol or use illicit drugs.   Family History: The patient's family history includes Heart disease in her sister; Stroke in her brother and sister.   Review of Systems: Please see the history of present illness.   Otherwise, the review of systems is positive for none.   All other systems are reviewed and negative.   Physical Exam: VS:  BP 140/90 mmHg  Pulse 91  Ht 5\' 3"  (1.6 m)  Wt 193 lb 12.8 oz (87.907 kg)  BMI 34.34 kg/m2  SpO2 98% .  BMI Body mass index is  34.34 kg/(m^2).  Wt Readings from Last 3 Encounters:  03/20/15 193 lb 12.8 oz (87.907 kg)  02/12/15 192 lb 3.2 oz (87.181 kg)  10/11/14 188 lb (85.276 kg)   Repeat BP by me is 140/80.   General: Pleasant. She is obese. She is in no acute distress.  HEENT: Normal. Neck: Supple, no JVD, carotid bruits,  or masses noted.  Cardiac: Regular rate and rhythm. No murmur.  No edema.  Respiratory:  Lungs are clear to auscultation bilaterally with normal work of breathing.  GI: Soft and nontender.  MS: No deformity or atrophy. Gait and ROM intact. Skin: Warm and dry. Color is normal.  Neuro:  Strength and sensation are intact and no gross focal deficits noted.  Psych: Alert, appropriate and with normal affect.   LABORATORY DATA:  EKG:  EKG is not ordered today.   Lab Results  Component Value Date   WBC 6.0 02/12/2015   HGB 12.7 02/12/2015   HCT 37.8 02/12/2015   PLT 203 02/12/2015   GLUCOSE 110* 02/12/2015   CHOL 216* 08/30/2014   TRIG 188* 08/30/2014   HDL 35* 08/30/2014   LDLDIRECT 182.5 09/12/2010   LDLCALC 143* 08/30/2014   ALT 19 02/12/2015   AST 19 02/12/2015   NA 142 02/12/2015   K 5.2 02/12/2015   CL 109 02/12/2015   CREATININE 0.91 02/12/2015   BUN 23 02/12/2015   CO2 21 02/12/2015   TSH 0.287* 02/12/2015   INR 1.08 02/04/2013   HGBA1C 5.8* 02/12/2015   MICROALBUR 0.51 03/01/2014    BNP (last 3 results) No results for input(s): BNP in the last 8760 hours.  ProBNP (last 3 results)  Recent Labs  10/11/14 1200  PROBNP 72.0     Other Studies Reviewed Today:  Echo Study Conclusions from 09/2014  - Left ventricle: Posterior basal hypokinesis. Wall thickness was increased in a pattern of mild LVH. Systolic function was normal. The estimated ejection fraction was in the range of 50% to 55%. - Aortic valve: There was mild regurgitation. - Mitral valve: There was mild regurgitation. - Left atrium: The atrium was mildly dilated. - Atrial septum: No defect  or patent foramen ovale was identified. - Pericardium, extracardiac: A trivial pericardial effusion was identified posterior to the heart  Assessment/Plan: 1. Nonischemic CM - recent echo was ok. I think most of her breathing issues are related to her weight/nonsedentary/past smoking. She wants to hold on PFTs and CXR for now - wants to try and work thru her son's death/settling estate, etc. Will tentatively see back in 6 months but she may call if she wishes to proceed sooner.   2. Tobacco abuse - not smoking   3. Obesity - Encouraged activity.  4. Fatigue -  TSH followed by PCP   5. Past smoker - most likely has COPD  Current medicines are reviewed with the patient today.  The patient does not have concerns regarding medicines other than what has been noted above.  The following changes have been made:  See above.  Labs/ tests ordered today include:   No orders of the defined types were placed in this encounter.   She is up to date with her labs. Needs Lasix refilled today. Will see back in 6 months.   Disposition:   FU with me in 6 months.   Patient is agreeable to this plan and will call if any problems develop in the interim.   Signed: Burtis Junes, RN, ANP-C 03/20/2015 1:46 PM  [facility] [address] [place], [place]  [address] Phone: [phone] Fax: [phone]

## 2015-04-03 ENCOUNTER — Other Ambulatory Visit: Payer: Self-pay | Admitting: Nurse Practitioner

## 2015-04-10 ENCOUNTER — Other Ambulatory Visit: Payer: Self-pay | Admitting: Nurse Practitioner

## 2015-07-27 ENCOUNTER — Telehealth: Payer: Self-pay

## 2015-07-27 ENCOUNTER — Other Ambulatory Visit: Payer: Self-pay | Admitting: Family Medicine

## 2015-07-27 NOTE — Telephone Encounter (Signed)
Pt is req. Refill last OV was 01/2015// upcoming appointment 08/2015            Original Order:  ALPRAZolam Duanne Moron) 0.25 MG tablet [id number]        Pharmacy:  [place], [place].        [phone]

## 2015-07-28 NOTE — Telephone Encounter (Signed)
Already refill req forwarded to Dr Tamala Julian.

## 2015-07-28 NOTE — Telephone Encounter (Signed)
Pt also called about this. She has appt sch 08/28/15.

## 2015-07-29 MED ORDER — ALPRAZOLAM 0.25 MG PO TABS: 0.2500 mg | ORAL_TABLET | Freq: Three times a day (TID) | ORAL | Status: DC | PRN

## 2015-07-29 NOTE — Addendum Note (Signed)
Addended by: Wardell Honour on: 07/29/2015 09:03 AM   Modules accepted: Orders

## 2015-07-29 NOTE — Telephone Encounter (Signed)
Please call in or fax in refill of Xanax 0.25.

## 2015-07-30 NOTE — Telephone Encounter (Signed)
Faxed and notified pt

## 2015-08-17 ENCOUNTER — Encounter: Payer: Medicare Other | Admitting: Family Medicine

## 2015-08-21 ENCOUNTER — Ambulatory Visit (INDEPENDENT_AMBULATORY_CARE_PROVIDER_SITE_OTHER): Payer: Medicare Other | Admitting: Family Medicine

## 2015-08-21 ENCOUNTER — Encounter: Payer: Self-pay | Admitting: Family Medicine

## 2015-08-21 VITALS — BP 141/84 | HR 48 | Temp 97.4°F | Resp 16 | Ht 63.0 in | Wt 196.0 lb

## 2015-08-21 DIAGNOSIS — R140 Abdominal distension (gaseous): Secondary | ICD-10-CM

## 2015-08-21 DIAGNOSIS — N181 Chronic kidney disease, stage 1: Secondary | ICD-10-CM | POA: Diagnosis not present

## 2015-08-21 DIAGNOSIS — N3941 Urge incontinence: Secondary | ICD-10-CM | POA: Diagnosis not present

## 2015-08-21 DIAGNOSIS — I10 Essential (primary) hypertension: Secondary | ICD-10-CM

## 2015-08-21 DIAGNOSIS — E785 Hyperlipidemia, unspecified: Secondary | ICD-10-CM | POA: Diagnosis not present

## 2015-08-21 DIAGNOSIS — R5381 Other malaise: Secondary | ICD-10-CM

## 2015-08-21 DIAGNOSIS — R5383 Other fatigue: Secondary | ICD-10-CM

## 2015-08-21 DIAGNOSIS — I5022 Chronic systolic (congestive) heart failure: Secondary | ICD-10-CM | POA: Diagnosis not present

## 2015-08-21 DIAGNOSIS — E034 Atrophy of thyroid (acquired): Secondary | ICD-10-CM | POA: Diagnosis not present

## 2015-08-21 DIAGNOSIS — K432 Incisional hernia without obstruction or gangrene: Secondary | ICD-10-CM

## 2015-08-21 DIAGNOSIS — F411 Generalized anxiety disorder: Secondary | ICD-10-CM | POA: Diagnosis not present

## 2015-08-21 DIAGNOSIS — I6522 Occlusion and stenosis of left carotid artery: Secondary | ICD-10-CM

## 2015-08-21 DIAGNOSIS — I429 Cardiomyopathy, unspecified: Secondary | ICD-10-CM | POA: Diagnosis not present

## 2015-08-21 DIAGNOSIS — R7302 Impaired glucose tolerance (oral): Secondary | ICD-10-CM

## 2015-08-21 DIAGNOSIS — E038 Other specified hypothyroidism: Secondary | ICD-10-CM

## 2015-08-21 DIAGNOSIS — Z8719 Personal history of other diseases of the digestive system: Secondary | ICD-10-CM

## 2015-08-21 DIAGNOSIS — M791 Myalgia, unspecified site: Secondary | ICD-10-CM

## 2015-08-21 DIAGNOSIS — I428 Other cardiomyopathies: Secondary | ICD-10-CM

## 2015-08-21 LAB — CBC WITH DIFFERENTIAL/PLATELET
BASOS PCT: 1 % (ref 0–1)
Basophils Absolute: 0.1 10*3/uL (ref 0.0–0.1)
Eosinophils Absolute: 0.1 10*3/uL (ref 0.0–0.7)
Eosinophils Relative: 2 % (ref 0–5)
HCT: 37.6 % (ref 36.0–46.0)
HEMOGLOBIN: 12.9 g/dL (ref 12.0–15.0)
LYMPHS ABS: 2 10*3/uL (ref 0.7–4.0)
Lymphocytes Relative: 33 % (ref 12–46)
MCH: 32.6 pg (ref 26.0–34.0)
MCHC: 34.3 g/dL (ref 30.0–36.0)
MCV: 94.9 fL (ref 78.0–100.0)
MONOS PCT: 7 % (ref 3–12)
MPV: 9.4 fL (ref 8.6–12.4)
Monocytes Absolute: 0.4 10*3/uL (ref 0.1–1.0)
NEUTROS ABS: 3.5 10*3/uL (ref 1.7–7.7)
NEUTROS PCT: 57 % (ref 43–77)
Platelets: 191 10*3/uL (ref 150–400)
RBC: 3.96 MIL/uL (ref 3.87–5.11)
RDW: 13.2 % (ref 11.5–15.5)
WBC: 6.2 10*3/uL (ref 4.0–10.5)

## 2015-08-21 LAB — POCT URINALYSIS DIP (MANUAL ENTRY)
Bilirubin, UA: NEGATIVE
Blood, UA: NEGATIVE
Glucose, UA: NEGATIVE
Ketones, POC UA: NEGATIVE
NITRITE UA: NEGATIVE
PROTEIN UA: NEGATIVE
SPEC GRAV UA: 1.015
UROBILINOGEN UA: 0.2
pH, UA: 5

## 2015-08-21 LAB — TSH: TSH: 0.37 m[IU]/L — AB

## 2015-08-21 LAB — T4, FREE: FREE T4: 1 ng/dL (ref 0.8–1.8)

## 2015-08-21 MED ORDER — LEVOTHYROXINE SODIUM 88 MCG PO TABS: ORAL_TABLET | ORAL | Status: DC

## 2015-08-21 MED ORDER — PANTOPRAZOLE SODIUM 40 MG PO TBEC: 40.0000 mg | DELAYED_RELEASE_TABLET | Freq: Every day | ORAL | Status: DC

## 2015-08-21 MED ORDER — ALPRAZOLAM 0.25 MG PO TABS: 0.2500 mg | ORAL_TABLET | Freq: Three times a day (TID) | ORAL | Status: DC | PRN

## 2015-08-21 NOTE — Progress Notes (Signed)
Subjective:    Patient ID: Teresa [name], female    DOB: [birth year], 75 y.o.   MRN: [id number]  08/21/2015  Abdominal Pain; Bloated; and Diarrhea   HPI This 75 y.o. female presents for for six month follow-up:  1.  Abdominal swelling: suffering with intermittent abdominal bloating, swelling. Having a lot of loose stools.  Concerned about serious process.  If eats a lot during the middle of the day, suffers with loose bowels.  Happens 95% of time.  Never occurs at night.  Eats dinner with no problems.  There is a serious urgency after lunch.  Onset for months.  I cannot remember when it hasn't been occurring.  Has been pre-occupied with foreclosing on son's house after his death; has been very distracted.  Feels horrible.  Sometimes arms are heavy.  Abdomen is assymetric.  Pelvic pain and worried about ovaries.  Hard to walk long distances due to abdomial pain; has epigastric pains imilar to ulcer pain.  If takes antacid, pain improves.  Feels like ovaries on fire.  At night in bed or when sleeping, pain free.  Only has pain with movement.  Last colonoscopy 2010; afraid of colonoscopy; s/p colon resection.  S/p multiple surgeries on abdomen; s/p hernia repairs.  Reluctant to undergo colonoscopy.  No bloody stools.  There are times where does have diarrhea; there is solid stool mixed in.  Starts out as an Recruitment consultant. Had normal b.m. This morning.    2.  Myalgias:  Arms feel sore and heavy.   3. Glucose intolerance: gaining weight; worried about sugars.  4. Hypothyroidism: Patient reports good compliance with medication, good tolerance to medication, and good symptom control.    5.  Hyperlipidemia: due for repeat level.  6. Anxiety: Patient reports good compliance with medication, good tolerance to medication, and good symptom control.  Has been busy with son's estate.   Review of Systems  Constitutional: Negative for fever, chills, diaphoresis and fatigue.  Eyes: Negative for visual  disturbance.  Respiratory: Negative for cough and shortness of breath.   Cardiovascular: Negative for chest pain, palpitations and leg swelling.  Gastrointestinal: Negative for nausea, vomiting, abdominal pain, diarrhea and constipation.  Endocrine: Negative for cold intolerance, heat intolerance, polydipsia, polyphagia and polyuria.  Neurological: Negative for dizziness, tremors, seizures, syncope, facial asymmetry, speech difficulty, weakness, light-headedness, numbness and headaches.    Past Medical History  Diagnosis Date  . Nonischemic cardiomyopathy ([place]) [facility]     EF is 25% per echo February 2012, non ischemic myoview 12/2009.  EF 50% echo 7/12 and plans for ICD cancelled.   Marland Kitchen HTN (hypertension)   . PVC (premature ventricular contraction)   . Valvular heart disease     Moderate to severe MR, moderate TR, LAE per echo 7/11  . Spinal stenosis   . Abdominal pain   . History of glaucoma   . Non-healing surgical wound     ABDOMINAL S/P EXPLORATOY LAPAROTOMY 04-18-2011  . History of small bowel obstruction 2009 AND NOV 2012  . Peptic ulcer disease   . GERD (gastroesophageal reflux disease)   . Chronic systolic CHF (congestive heart failure), NYHA class 2 ([place])     EF 25% 2011 MV,  50-55% echo 6/13  . Moderate mitral regurgitation   . Moderate tricuspid regurgitation   . Arthritis     FEET, KNEES, HANDS  . H/O hiatal hernia   . Urge urinary incontinence   . Left ovarian cyst   . Diabetes mellitus type  2, diet-controlled ([place])   . Shortness of breath   . Wears dentures     upper denture-lower partial  . Hypothyroidism   . Anxiety     maintained on Xanax tid for years.  . Glaucoma     s/p surgery B in 18s.  [place].   Past Surgical History  Procedure Laterality Date  . Tubal ligation    . Glaucoma surgery  1978  . Exploratomy lap. / extensive lysis adhesions/ small bowel resection x2 with primary anastomosis x2  04-18-2011    SMALL BOWEL OBSTRUCTION  . Knee  arthroscopy w/ meniscectomy  06-05-2004  . Transthoracic echocardiogram  12-27-2010    EF 45-50%/ MODERATE MV REGURG. / LEFT ATRIUM MILDLY DILATED/ MILD TRICUSPID REGURG.  . Cardiovascular stress test  JULY 2011-  DR HOCHREIN    NO ISCHEMIA  . Thyroidectomy  1978 GOITOR    AND CERVICAL COLD KNIFE CONE BX  . Tonsillectomy  AGE 89  . Benign right breast tumor removed    . Appendectomy  1979    RUPTURED  . Abdominal hernia repair  59 YRS AGO    PERITINITIS  . Ventral hernia repair  X4  LAST ONE 20 YRS AGO    ABDOMINAL --  EACH ONE IN DIFFERENT AREA  . Wound debridement  09/25/2011    Procedure: DEBRIDEMENT CLOSURE/ABDOMINAL WOUND;  Surgeon: Theodoro Kos, DO;  Location: [facility];  Service: Plastics;  Laterality: N/A;  excision of abdominal wound with primary closure  . Ventral hernia repair  01/29/2012    Procedure: HERNIA REPAIR VENTRAL ADULT;  Surgeon: Theodoro Kos, DO;  Location: [facility];  Service: Plastics;  Laterality: N/A;  . Hernia repair    . Incision and drainage of wound N/A 09/13/2012    Procedure: INCISION  AND DEBRIDEMENT WOUND abdominal wall ;  Surgeon: Rolm Bookbinder, MD;  Location: [facility];  Service: General;  Laterality: N/A;  coordination with Dr Migdalia Dk    Allergies  Allergen Reactions  . Aspirin Other (See Comments)    HX Bleeding Ulcer   . Nsaids     Hx of bleeding ulcers  . Tolmetin Rash    Hx of bleeding ulcers   Current Outpatient Prescriptions  Medication Sig Dispense Refill  . ALPRAZolam (XANAX) 0.25 MG tablet Take 1 tablet (0.25 mg total) by mouth 3 (three) times daily as needed for anxiety. 90 tablet 5  . carvedilol (COREG) 12.5 MG tablet TAKE 1 TABLET TWICE DAILY WITH A MEAL. 60 tablet 9  . furosemide (LASIX) 40 MG tablet Take 1 tablet (40 mg total) by mouth as needed for fluid or edema. 30 tablet 11  . HYDROcodone-acetaminophen (NORCO/VICODIN) 5-325 MG per tablet Take 1-2 tablets by mouth every 4 (four) hours as needed.  40 tablet 0  . levothyroxine (SYNTHROID, LEVOTHROID) 75 MCG tablet TAKE  1 TABLET ONCE DAILY BEFORE BREAKFAST 30 tablet 5  . losartan (COZAAR) 25 MG tablet TAKE 1 TABLET DAILY. 30 tablet 11  . spironolactone (ALDACTONE) 25 MG tablet TAKE 1/2 TABLET AT BEDTIME. 30 tablet 11  . pantoprazole (PROTONIX) 40 MG tablet Take 1 tablet (40 mg total) by mouth daily. 90 tablet 3   No current facility-administered medications for this visit.   Social History   Social History  . Marital Status: Widowed    Spouse Name: N/A  . Number of Children: N/A  . Years of Education: N/A   Occupational History  . Not on file.   Social History  Main Topics  . Smoking status: Former Smoker -- 30 years    Types: Cigarettes    Quit date: 09/22/2010  . Smokeless tobacco: Never Used     Comment: quit 2011  . Alcohol Use: No  . Drug Use: No  . Sexual Activity: Not Currently   Other Topics Concern  . Not on file   Social History Narrative   Marital status:  Widowed as of 2012; not dating.        Children: four children; six grandchildren; 5 gg.      Lives: alone with a yorkie.      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      Tobacco:  None; quit in 2012.        Alcohol: none      Drugs:none      Exercise:  No formal exercise program.  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  Cannot swim.        ADLs:  Driving; cleans own house; does grocery shopping; pays bills; does not use cane for ambulation.      Advanced Directives: no living will; no HCPOA.  DNR/DNI.     Family History  Problem Relation Age of Onset  . Stroke Brother   . Stroke Sister   . Heart disease Sister        Objective:    BP 141/84 mmHg  Pulse 48  Temp(Src) 97.4 F (36.3 C)  Resp 16  Ht 5\' 3"  (1.6 m)  Wt 196 lb (88.905 kg)  BMI 34.73 kg/m2 Physical Exam  Constitutional: She is oriented to person, place, and time. She appears well-developed and well-nourished. No distress.  HENT:    Head: Normocephalic and atraumatic.  Right Ear: External ear normal.  Left Ear: External ear normal.  Nose: Nose normal.  Mouth/Throat: Oropharynx is clear and moist.  Eyes: Conjunctivae and EOM are normal. Pupils are equal, round, and reactive to light.  Neck: Normal range of motion. Neck supple. Carotid bruit is not present. No thyromegaly present.  Cardiovascular: Normal rate, regular rhythm, normal heart sounds and intact distal pulses.  Exam reveals no gallop and no friction rub.   No murmur heard. Pulmonary/Chest: Effort normal and breath sounds normal. She has no wheezes. She has no rales.  Abdominal: Soft. Bowel sounds are normal. She exhibits no distension and no mass. There is generalized tenderness. There is no rebound and no guarding. A hernia is present. Hernia confirmed positive in the ventral area.  Lymphadenopathy:    She has no cervical adenopathy.  Neurological: She is alert and oriented to person, place, and time. No cranial nerve deficit.  Skin: Skin is warm and dry. No rash noted. She is not diaphoretic. No erythema. No pallor.  Psychiatric: She has a normal mood and affect. Her behavior is normal.        Assessment & Plan:   1. Essential hypertension   2. Hypothyroidism due to acquired atrophy of thyroid   3. Dyslipidemia   4. Chronic renal insufficiency, stage 1   5. Urge urinary incontinence   6. Nonischemic cardiomyopathy ([place])   7. History of small bowel obstruction   8. Generalized anxiety disorder   9. Chronic systolic CHF (congestive heart failure), NYHA class 2 ([place])   10. Glucose intolerance (impaired glucose tolerance)   11. Hyperlipidemia   12. Malaise and fatigue   13. Myalgia   14. Abdominal bloating   15. H/O hiatal hernia  16. Incisional hernia, without obstruction or gangrene     Orders Placed This Encounter  Procedures  . CT Abdomen Pelvis W Contrast    Labs drawn 08-21-15 in epic Wt-196/not diabetic or prediabetic/no kidney  ca,dz,failure, or solitary/nkda to iv/no needs Pt has oral contrast Ins-mcr/np and pt/epic order    Standing Status: Future     Number of Occurrences: 1     Standing Expiration Date: 11/20/2016    Order Specific Question:  If indicated for the ordered procedure, I authorize the administration of contrast media per Radiology protocol    Answer:  Yes    Order Specific Question:  Reason for Exam (SYMPTOM  OR DIAGNOSIS REQUIRED)    Answer:  abdominal bloating; pelvic pain/burning    Order Specific Question:  Preferred imaging location?    Answer:  [facility]  . CBC with Differential/Platelet  . Comprehensive metabolic panel    Order Specific Question:  Has the patient fasted?    Answer:  Yes  . Hemoglobin A1c  . Lipid panel    Order Specific Question:  Has the patient fasted?    Answer:  Yes  . TSH  . T4, free  . POCT urinalysis dipstick  . HM Diabetes Foot Exam   Meds ordered this encounter  Medications  . ALPRAZolam (XANAX) 0.25 MG tablet    Sig: Take 1 tablet (0.25 mg total) by mouth 3 (three) times daily as needed for anxiety.    Dispense:  90 tablet    Refill:  5  . pantoprazole (PROTONIX) 40 MG tablet    Sig: Take 1 tablet (40 mg total) by mouth daily.    Dispense:  90 tablet    Refill:  3  . DISCONTD: levothyroxine (SYNTHROID, LEVOTHROID) 88 MCG tablet    Sig: TAKE  1 TABLET ONCE DAILY BEFORE BREAKFAST    Dispense:  30 tablet    Refill:  5  . levothyroxine (SYNTHROID, LEVOTHROID) 75 MCG tablet    Sig: TAKE  1 TABLET ONCE DAILY BEFORE BREAKFAST    Dispense:  30 tablet    Refill:  5    Return in about 6 months (around 02/21/2016) for complete physical examiniation.    Sirr Kabel Elayne Guerin, M.D. [facility] [address] [place], [place]  [address] [phone] phone [phone] fax

## 2015-08-21 NOTE — Patient Instructions (Signed)

## 2015-08-22 LAB — COMPREHENSIVE METABOLIC PANEL
ALBUMIN: 4 g/dL (ref 3.6–5.1)
ALK PHOS: 67 U/L (ref 33–130)
ALT: 14 U/L (ref 6–29)
AST: 16 U/L (ref 10–35)
BILIRUBIN TOTAL: 0.3 mg/dL (ref 0.2–1.2)
BUN: 18 mg/dL (ref 7–25)
CHLORIDE: 107 mmol/L (ref 98–110)
CO2: 22 mmol/L (ref 20–31)
CREATININE: 0.98 mg/dL — AB (ref 0.60–0.93)
Calcium: 9 mg/dL (ref 8.6–10.4)
Glucose, Bld: 98 mg/dL (ref 65–99)
Potassium: 5 mmol/L (ref 3.5–5.3)
SODIUM: 137 mmol/L (ref 135–146)
TOTAL PROTEIN: 6.4 g/dL (ref 6.1–8.1)

## 2015-08-22 LAB — HEMOGLOBIN A1C
HEMOGLOBIN A1C: 5.8 % — AB (ref <5.7)
MEAN PLASMA GLUCOSE: 120 mg/dL — AB (ref <117)

## 2015-08-22 LAB — LIPID PANEL
Cholesterol: 213 mg/dL — ABNORMAL HIGH (ref 125–200)
HDL: 31 mg/dL — AB (ref >=46)
LDL Cholesterol: 145 mg/dL — ABNORMAL HIGH (ref <130)
Total CHOL/HDL Ratio: 6.9 Ratio — ABNORMAL HIGH (ref <=5.0)
Triglycerides: 183 mg/dL — ABNORMAL HIGH (ref <150)
VLDL: 37 mg/dL — ABNORMAL HIGH (ref <30)

## 2015-08-27 ENCOUNTER — Telehealth: Payer: Self-pay | Admitting: Nurse Practitioner

## 2015-08-27 ENCOUNTER — Other Ambulatory Visit: Payer: Self-pay | Admitting: *Deleted

## 2015-08-27 DIAGNOSIS — R0602 Shortness of breath: Secondary | ICD-10-CM

## 2015-08-27 NOTE — Telephone Encounter (Signed)
S/w pt is getting CT from another office and would like to get CXR as well while pt is already at the office.  Last note was from October and pt was suppose to call us back to proceed with CXR and PFT's. Pt stated is not having any new symptoms with SOB still just SOB with exertion.  Pt stated would not like to proceed with PFT"s till pt get's results from CT and cxr.  I put in new order for pt to get cxr at [facility].

## 2015-08-27 NOTE — Telephone Encounter (Signed)
Pt needs a chest xray before CT Scan on Thursday.Can she do them both the same day? Will she make the arrangement for that?

## 2015-08-28 ENCOUNTER — Encounter: Payer: Medicare Other | Admitting: Family Medicine

## 2015-08-30 ENCOUNTER — Ambulatory Visit
Admission: RE | Admit: 2015-08-30 | Discharge: 2015-08-30 | Disposition: A | Discharge status: Home / Self Care | Payer: Medicare Other | Source: Ambulatory Visit | Attending: Family Medicine | Admitting: Family Medicine

## 2015-08-30 ENCOUNTER — Ambulatory Visit
Admission: RE | Admit: 2015-08-30 | Discharge: 2015-08-30 | Disposition: A | Discharge status: Home / Self Care | Payer: Medicare Other | Source: Ambulatory Visit | Attending: Nurse Practitioner | Admitting: Nurse Practitioner

## 2015-08-30 DIAGNOSIS — R0602 Shortness of breath: Secondary | ICD-10-CM

## 2015-08-30 DIAGNOSIS — R140 Abdominal distension (gaseous): Secondary | ICD-10-CM

## 2015-08-30 DIAGNOSIS — K432 Incisional hernia without obstruction or gangrene: Secondary | ICD-10-CM

## 2015-08-30 DIAGNOSIS — Z8719 Personal history of other diseases of the digestive system: Secondary | ICD-10-CM

## 2015-08-30 MED ORDER — IOPAMIDOL (ISOVUE-300) INJECTION 61%
100.0000 mL | Freq: Once | INTRAVENOUS | Status: AC | PRN
  Administered 2015-08-30: 100 mL via INTRAVENOUS

## 2015-08-31 ENCOUNTER — Other Ambulatory Visit: Payer: Self-pay

## 2015-08-31 DIAGNOSIS — I6522 Occlusion and stenosis of left carotid artery: Secondary | ICD-10-CM

## 2015-09-04 ENCOUNTER — Telehealth: Payer: Self-pay

## 2015-09-04 NOTE — Telephone Encounter (Signed)
Pt states she had labs done and really anxious to know results. Please call [phone]

## 2015-09-06 NOTE — Telephone Encounter (Signed)
Patient is having stomach pain and wants her labs from the first of the month and also her CT Scan   dopler scheduled for tomorror.   [phone]

## 2015-09-06 NOTE — Telephone Encounter (Signed)
Dr. Tamala Julian, please review.

## 2015-09-06 NOTE — Telephone Encounter (Signed)
Pt called to check status of req.// pt would like review or labs and CT   [phone]

## 2015-09-07 ENCOUNTER — Ambulatory Visit (HOSPITAL_COMMUNITY)
Admission: RE | Admit: 2015-09-07 | Discharge: 2015-09-07 | Disposition: A | Discharge status: Home / Self Care | Payer: Medicare Other | Source: Ambulatory Visit | Attending: Nurse Practitioner | Admitting: Nurse Practitioner

## 2015-09-07 ENCOUNTER — Other Ambulatory Visit: Payer: Self-pay | Admitting: Family Medicine

## 2015-09-07 ENCOUNTER — Telehealth: Payer: Self-pay

## 2015-09-07 DIAGNOSIS — K869 Disease of pancreas, unspecified: Secondary | ICD-10-CM

## 2015-09-07 DIAGNOSIS — I6522 Occlusion and stenosis of left carotid artery: Secondary | ICD-10-CM | POA: Diagnosis not present

## 2015-09-07 DIAGNOSIS — I5022 Chronic systolic (congestive) heart failure: Secondary | ICD-10-CM | POA: Insufficient documentation

## 2015-09-07 DIAGNOSIS — I110 Hypertensive heart disease with heart failure: Secondary | ICD-10-CM | POA: Insufficient documentation

## 2015-09-07 DIAGNOSIS — E119 Type 2 diabetes mellitus without complications: Secondary | ICD-10-CM | POA: Diagnosis not present

## 2015-09-07 MED ORDER — LEVOTHYROXINE SODIUM 75 MCG PO TABS: ORAL_TABLET | ORAL | Status: DC

## 2015-09-07 NOTE — Telephone Encounter (Signed)
I have commented on CT abd/pelvis and lab results and sent results to appropriate pools. Please try to call patient again with these results.

## 2015-09-07 NOTE — Telephone Encounter (Signed)
[facility] called - they need the patient's MRI order changed to an MRI Abdomen With & Without Contrast.  She is scheduled for the MRI on 09/18/15 and will need this changed in order to keep the appointment.  Thank you.

## 2015-09-08 ENCOUNTER — Telehealth: Payer: Self-pay | Admitting: *Deleted

## 2015-09-08 NOTE — Telephone Encounter (Signed)
Noted  

## 2015-09-08 NOTE — Telephone Encounter (Signed)
Dr. Ishmael Holter  Spoke with pt about results she still wants to work on her diet to control the cholesterol.  Also before she picks up the medication of the thyroid medication she wants you to recheck her thyroid again.  She has gained weight.  She will be in hopefully on Wednesday to see you.

## 2015-09-08 NOTE — Telephone Encounter (Signed)
-----   Message from Wardell Honour, MD sent at 09/07/2015  3:47 PM EDT ----- Call --- 1.  Hemoglobin A1c is 5.8 which is in [facility] but is unchanged from last August 2016.  Sugar/glucose is normal at 98.   2. Liver and kidney functions are normal.  3. Sodium and potassium levels are normal.  4. Cholesterol is elevated and unchanged from last check one year ago.  I again recommend a trial of a different cholesterol medication other than Lipitor.    5. Thyroid function remains slightly over-corrected again. Due to persistent over-correction, I recommend adjusting thyroid dose from 45mcg to 42mcg daily. I have sent in a new prescription to her pharmacy.   6.  No evidence of anemia. 7. Urine is normal.

## 2015-09-11 NOTE — Telephone Encounter (Signed)
Pt was notified of results

## 2015-09-17 ENCOUNTER — Telehealth: Payer: Self-pay

## 2015-09-17 DIAGNOSIS — K869 Disease of pancreas, unspecified: Secondary | ICD-10-CM

## 2015-09-17 NOTE — Telephone Encounter (Signed)
It wont let me sign order under this diagnosis.Done.

## 2015-09-17 NOTE — Telephone Encounter (Signed)
 Imaging called for a change needed for the MRI scheduled on 09/18/15.  It needs to be changed from an MRI Abdomen Without Contrast to an MRI Abdomen With and Without Contrast MRCP.

## 2015-09-18 ENCOUNTER — Inpatient Hospital Stay: Admission: RE | Admit: 2015-09-18 | Payer: Medicare Other | Source: Ambulatory Visit

## 2015-09-18 ENCOUNTER — Other Ambulatory Visit: Payer: Self-pay

## 2015-09-18 ENCOUNTER — Ambulatory Visit
Admission: RE | Admit: 2015-09-18 | Discharge: 2015-09-18 | Disposition: A | Discharge status: Home / Self Care | Payer: Medicare Other | Source: Ambulatory Visit | Attending: Family Medicine | Admitting: Family Medicine

## 2015-09-18 DIAGNOSIS — K869 Disease of pancreas, unspecified: Secondary | ICD-10-CM

## 2015-09-18 MED ORDER — GADOBENATE DIMEGLUMINE 529 MG/ML IV SOLN
18.0000 mL | Freq: Once | INTRAVENOUS | Status: AC | PRN
Start: 2015-09-18 — End: 2015-09-18
  Administered 2015-09-18: 18 mL via INTRAVENOUS

## 2015-09-18 NOTE — Addendum Note (Signed)
Addended by: Jannette Spanner on: 09/18/2015 09:00 AM   Modules accepted: Orders

## 2015-09-21 ENCOUNTER — Telehealth: Payer: Self-pay

## 2015-09-21 NOTE — Telephone Encounter (Signed)
Please review MRI report.

## 2015-09-21 NOTE — Telephone Encounter (Signed)
Pt is looking for mri results  Best number [phone]

## 2015-09-25 ENCOUNTER — Ambulatory Visit: Payer: Medicare Other | Admitting: Nurse Practitioner

## 2015-09-25 NOTE — Telephone Encounter (Signed)
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 They recommended follow up MRI of her abdomen in 1 year to make sure this cyst is stable. Follow up with Dr. Tamala Julian about getting this scheduled.

## 2015-09-25 NOTE — Telephone Encounter (Signed)
SPoke with pt, advised results. Pt understood. She has a question about the hernia found on the CT. She states she cannot walk very far and she thinks something is going on in her abdomen. Please advise.

## 2015-09-25 NOTE — Telephone Encounter (Signed)
Pt would like MRI results.  This is her third call about this.  I notified her that Dr Tamala Julian was off this week.  She was unaware of this and wants to know if anyone else could please review them.  Please at least call her back at [phone]

## 2015-09-28 ENCOUNTER — Ambulatory Visit (INDEPENDENT_AMBULATORY_CARE_PROVIDER_SITE_OTHER): Payer: Medicare Other | Admitting: Nurse Practitioner

## 2015-09-28 ENCOUNTER — Encounter: Payer: Self-pay | Admitting: Nurse Practitioner

## 2015-09-28 VITALS — BP 152/98 | HR 89 | Ht 63.0 in | Wt 199.1 lb

## 2015-09-28 DIAGNOSIS — I6522 Occlusion and stenosis of left carotid artery: Secondary | ICD-10-CM

## 2015-09-28 DIAGNOSIS — I428 Other cardiomyopathies: Secondary | ICD-10-CM

## 2015-09-28 DIAGNOSIS — I429 Cardiomyopathy, unspecified: Secondary | ICD-10-CM | POA: Diagnosis not present

## 2015-09-28 NOTE — Patient Instructions (Addendum)
We will be checking the following labs today - NONE   Medication Instructions:    Continue with your current medicines.     Testing/Procedures To Be Arranged:  N/A  Follow-Up:   See me in 6 months    Other Special Instructions:   Try to be as active as you can    If you need a refill on your cardiac medications before your next appointment, please call your pharmacy.   Call the [place] office at [phone] if you have any questions, problems or concerns.

## 2015-09-28 NOTE — Progress Notes (Signed)
CARDIOLOGY OFFICE NOTE  Date:  09/28/2015    Teresa [name] Date of Birth: [birth year] Medical Record [id number]  PCP:  Reginia Forts, MD  Cardiologist:  [facility]    Chief Complaint  Patient presents with  . Hypertension  . Cardiomyopathy    Follow up visit - seen for Dr. Percival Spanish    History of Present Illness: Teresa [name] is a 75 y.o. female who presents today for a 6 month check. Seen for Dr. Percival Spanish - former patient of Dr. Susa Simmonds.   Has a nonischemic CM. Past EF down to 25%. EF improved with medicines. Plans for an ICD in the past were cancelled due to recovery of her EF. Other issues include tobacco abuse, HTN, PVCs, DM, valvular heart disease, spinal stenosis and glaucoma. Had a small bowel obstruction back in November of 2012 and had surgery with delayed wound healing.   I saw her back in April of 2016 - more weight gain - more short of breath. Updated her echo.This was ok. Referred for PFTs but looks like she has not completed.   Last seen by me back in 04/22/2023 - her son had died. Lots of grief and stress. More short of breath. Finally got a CXR on her - she did not proceed with PFTs. CXR with carotid calcification - got a doppler which was basically normal.   Comes in today. Here alone. Says she is "ok". Getting "fatter". 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 She has not been told formally the results. No chest pain. No palpitations. Breathing is ok as long as she does not overexert. Not lightheaded or dizzy. She did not take her medicines today. BP is up today.   Past Medical History  Diagnosis Date  . Nonischemic cardiomyopathy ([place]) [facility]     EF is 25% per echo February 2012, non ischemic myoview 12/2009.  EF 50% echo 7/12 and plans for ICD cancelled.   Marland Kitchen HTN (hypertension)   . PVC (premature ventricular contraction)   . Valvular heart disease     Moderate to severe MR, moderate TR, LAE per  echo 7/11  . Spinal stenosis   . Abdominal pain   . History of glaucoma   . Non-healing surgical wound     ABDOMINAL S/P EXPLORATOY LAPAROTOMY 04-18-2011  . History of small bowel obstruction 2009 AND NOV 2012  . Peptic ulcer disease   . GERD (gastroesophageal reflux disease)   . Chronic systolic CHF (congestive heart failure), NYHA class 2 ([place])     EF 25% 2011 MV,  50-55% echo 6/13  . Moderate mitral regurgitation   . Moderate tricuspid regurgitation   . Arthritis     FEET, KNEES, HANDS  . H/O hiatal hernia   . Urge urinary incontinence   . Left ovarian cyst   . Diabetes mellitus type 2, diet-controlled ([place])   . Shortness of breath   . Wears dentures     upper denture-lower partial  . Hypothyroidism   . Anxiety     maintained on Xanax tid for years.  . Glaucoma     s/p surgery B in 39s.  [place].    Past Surgical History  Procedure Laterality Date  . Tubal ligation    . Glaucoma surgery  1978  . Exploratomy lap. / extensive lysis adhesions/ small bowel resection x2 with primary anastomosis x2  04-18-2011    SMALL BOWEL  OBSTRUCTION  . Knee arthroscopy w/ meniscectomy  06-05-2004  . Transthoracic echocardiogram  12-27-2010    EF 45-50%/ MODERATE MV REGURG. / LEFT ATRIUM MILDLY DILATED/ MILD TRICUSPID REGURG.  . Cardiovascular stress test  JULY 2011-  DR HOCHREIN    NO ISCHEMIA  . Thyroidectomy  1978 GOITOR    AND CERVICAL COLD KNIFE CONE BX  . Tonsillectomy  AGE 16  . Benign right breast tumor removed    . Appendectomy  1979    RUPTURED  . Abdominal hernia repair  80 YRS AGO    PERITINITIS  . Ventral hernia repair  X4  LAST ONE 20 YRS AGO    ABDOMINAL --  EACH ONE IN DIFFERENT AREA  . Wound debridement  09/25/2011    Procedure: DEBRIDEMENT CLOSURE/ABDOMINAL WOUND;  Surgeon: Theodoro Kos, DO;  Location: [place];  Service: Plastics;  Laterality: N/A;  excision of abdominal wound with primary closure  . Ventral hernia repair  01/29/2012    Procedure:  HERNIA REPAIR VENTRAL ADULT;  Surgeon: Theodoro Kos, DO;  Location: [place];  Service: Plastics;  Laterality: N/A;  . Hernia repair    . Incision and drainage of wound N/A 09/13/2012    Procedure: INCISION  AND DEBRIDEMENT WOUND abdominal wall ;  Surgeon: Rolm Bookbinder, MD;  Location: [place];  Service: General;  Laterality: N/A;  coordination with Dr Migdalia Dk      Medications: Current Outpatient Prescriptions  Medication Sig Dispense Refill  . ALPRAZolam (XANAX) 0.25 MG tablet Take 1 tablet (0.25 mg total) by mouth 3 (three) times daily as needed for anxiety. 90 tablet 5  . carvedilol (COREG) 12.5 MG tablet TAKE 1 TABLET TWICE DAILY WITH A MEAL. 60 tablet 9  . furosemide (LASIX) 40 MG tablet Take 1 tablet (40 mg total) by mouth as needed for fluid or edema. 30 tablet 11  . HYDROcodone-acetaminophen (NORCO/VICODIN) 5-325 MG tablet Take 1 tablet by mouth every 6 (six) hours as needed for moderate pain (back).    Marland Kitchen levothyroxine (SYNTHROID, LEVOTHROID) 75 MCG tablet TAKE  1 TABLET ONCE DAILY BEFORE BREAKFAST 30 tablet 5  . losartan (COZAAR) 25 MG tablet TAKE 1 TABLET DAILY. 30 tablet 11  . pantoprazole (PROTONIX) 40 MG tablet Take 1 tablet (40 mg total) by mouth daily. 90 tablet 3  . spironolactone (ALDACTONE) 25 MG tablet TAKE 1/2 TABLET AT BEDTIME. 30 tablet 11   No current facility-administered medications for this visit.    Allergies: Allergies  Allergen Reactions  . Aspirin Other (See Comments)    HX Bleeding Ulcer   . Nsaids     Hx of bleeding ulcers  . Tolmetin Rash    Hx of bleeding ulcers    Social History: The patient  reports that she quit smoking about 5 years ago. Her smoking use included Cigarettes. She quit after 30 years of use. She has never used smokeless tobacco. She reports that she does not drink alcohol or use illicit drugs.   Family History: The patient's family history includes Heart disease in her sister; Stroke in her brother and sister.     Review of Systems: Please see the history of present illness.   Otherwise, the review of systems is positive for none.   All other systems are reviewed and negative.   Physical Exam: VS:  BP 152/98 mmHg  Pulse 89  Ht 5\' 3"  (1.6 m)  Wt 199 lb 1.9 oz (90.32 kg)  BMI 35.28 kg/m2 .  BMI  Body mass index is 35.28 kg/(m^2).  Wt Readings from Last 3 Encounters:  09/28/15 199 lb 1.9 oz (90.32 kg)  08/21/15 196 lb (88.905 kg)  03/20/15 193 lb 12.8 oz (87.907 kg)    General: Pleasant. Well developed, well nourished and in no acute distress.  HEENT: Normal. Neck: Supple, no JVD, carotid bruits, or masses noted.  Cardiac: Regular rate and rhythm. No murmurs, rubs, or gallops. No edema.  Respiratory:  Lungs are clear to auscultation bilaterally with normal work of breathing.  GI: Soft and nontender.  MS: No deformity or atrophy. Gait and ROM intact. Skin: Warm and dry. Color is normal.  Neuro:  Strength and sensation are intact and no gross focal deficits noted.  Psych: Alert, appropriate and with normal affect.   LABORATORY DATA:  EKG:  EKG is ordered today. This demonstrates NSR with chronic T wave changes.   Lab Results  Component Value Date   WBC 6.2 08/21/2015   HGB 12.9 08/21/2015   HCT 37.6 08/21/2015   PLT 191 08/21/2015   GLUCOSE 98 08/21/2015   CHOL 213* 08/21/2015   TRIG 183* 08/21/2015   HDL 31* 08/21/2015   LDLDIRECT 182.5 09/12/2010   LDLCALC 145* 08/21/2015   ALT 14 08/21/2015   AST 16 08/21/2015   NA 137 08/21/2015   K 5.0 08/21/2015   CL 107 08/21/2015   CREATININE 0.98* 08/21/2015   BUN 18 08/21/2015   CO2 22 08/21/2015   TSH 0.37* 08/21/2015   INR 1.08 02/04/2013   HGBA1C 5.8* 08/21/2015   MICROALBUR 0.51 03/01/2014    BNP (last 3 results) No results for input(s): BNP in the last 8760 hours.  ProBNP (last 3 results)  Recent Labs  10/11/14 1200  PROBNP 72.0     Other Studies Reviewed Today:  MR ABD IMPRESSION: 14 mm unilocular cystic  lesion along the posterior aspect of the pancreatic tail, likely reflecting a benign pseudocyst, less likely a side branch IPMN.  Follow-up MRI abdomen with/ without contrast is suggested in 12 months.  This recommendation follows ACR consensus guidelines: Managing Incidental Findings on Abdominal CT: White Paper of the ACR Incidental Findings Committee. J Am Coll Radiol 2010;7:754-773.   Electronically Signed  By: Julian Hy M.D.  On: 09/18/2015 12:36  Echo Study Conclusions from 09/2014  - Left ventricle: Posterior basal hypokinesis. Wall thickness was increased in a pattern of mild LVH. Systolic function was normal. The estimated ejection fraction was in the range of 50% to 55%. - Aortic valve: There was mild regurgitation. - Mitral valve: There was mild regurgitation. - Left atrium: The atrium was mildly dilated. - Atrial septum: No defect or patent foramen ovale was identified. - Pericardium, extracardiac: A trivial pericardial effusion was identified posterior to the heart  Assessment/Plan: 1. Nonischemic CM - recent echo was ok. I think most of her breathing issues are related to her weight/nonsedentary/past smoking. She has not wanted to proceed with PFTs. I think overall she is stable.   2. Tobacco abuse - not smoking   3. Obesity - Encouraged activity.   4. Fatigue - TSH followed by PCP   5. Past smoker - most likely has COPD  Current medicines are reviewed with the patient today.  The patient does not have concerns regarding medicines other than what has been noted above.  The following changes have been made:  See above.  Labs/ tests ordered today include:   No orders of the defined types were placed in this encounter.  Disposition:   FU with me in 6 months.   Patient is agreeable to this plan and will call if any problems develop in the interim.   Signed: Burtis Junes, RN, ANP-C 09/28/2015 2:29 PM  [facility] [address] [place], [place]  [address] Phone: [phone] Fax: [phone]

## 2015-12-15 ENCOUNTER — Telehealth: Payer: Self-pay

## 2015-12-15 NOTE — Telephone Encounter (Signed)
Patient dropped off disability parking placard form.   She would like it mailed to her address.   The form is in the nurses box for completion.   [phone] (M)

## 2016-01-07 NOTE — Telephone Encounter (Signed)
Patient has not received her disability placard in the mail.   She dropped it off on July 1.

## 2016-01-10 ENCOUNTER — Telehealth: Payer: Self-pay | Admitting: Family Medicine

## 2016-01-10 NOTE — Telephone Encounter (Signed)
Pt states that she never received her DMV replacement cards she states that it should be for 5 yrs I have spoken will Sharee Pimple about this and she will fill sheet out and pt son will come pick this up form was given to [place]

## 2016-01-15 ENCOUNTER — Other Ambulatory Visit: Payer: Self-pay | Admitting: Nurse Practitioner

## 2016-02-26 ENCOUNTER — Ambulatory Visit (INDEPENDENT_AMBULATORY_CARE_PROVIDER_SITE_OTHER): Payer: Medicare Other | Admitting: Family Medicine

## 2016-02-26 ENCOUNTER — Encounter: Payer: Self-pay | Admitting: Family Medicine

## 2016-02-26 VITALS — BP 134/80 | HR 84 | Temp 98.0°F | Resp 18 | Ht 63.0 in | Wt 213.0 lb

## 2016-02-26 DIAGNOSIS — M19042 Primary osteoarthritis, left hand: Secondary | ICD-10-CM

## 2016-02-26 DIAGNOSIS — E119 Type 2 diabetes mellitus without complications: Secondary | ICD-10-CM

## 2016-02-26 DIAGNOSIS — M19041 Primary osteoarthritis, right hand: Secondary | ICD-10-CM | POA: Diagnosis not present

## 2016-02-26 DIAGNOSIS — I340 Nonrheumatic mitral (valve) insufficiency: Secondary | ICD-10-CM

## 2016-02-26 DIAGNOSIS — K219 Gastro-esophageal reflux disease without esophagitis: Secondary | ICD-10-CM | POA: Diagnosis not present

## 2016-02-26 DIAGNOSIS — I10 Essential (primary) hypertension: Secondary | ICD-10-CM | POA: Diagnosis not present

## 2016-02-26 DIAGNOSIS — E034 Atrophy of thyroid (acquired): Secondary | ICD-10-CM | POA: Diagnosis not present

## 2016-02-26 DIAGNOSIS — I361 Nonrheumatic tricuspid (valve) insufficiency: Secondary | ICD-10-CM | POA: Diagnosis not present

## 2016-02-26 DIAGNOSIS — N181 Chronic kidney disease, stage 1: Secondary | ICD-10-CM | POA: Diagnosis not present

## 2016-02-26 DIAGNOSIS — I6522 Occlusion and stenosis of left carotid artery: Secondary | ICD-10-CM | POA: Diagnosis not present

## 2016-02-26 DIAGNOSIS — N2889 Other specified disorders of kidney and ureter: Secondary | ICD-10-CM

## 2016-02-26 DIAGNOSIS — Z0000 Encounter for general adult medical examination without abnormal findings: Secondary | ICD-10-CM

## 2016-02-26 DIAGNOSIS — K5669 Other intestinal obstruction: Secondary | ICD-10-CM | POA: Diagnosis not present

## 2016-02-26 DIAGNOSIS — I429 Cardiomyopathy, unspecified: Secondary | ICD-10-CM | POA: Diagnosis not present

## 2016-02-26 DIAGNOSIS — N3941 Urge incontinence: Secondary | ICD-10-CM

## 2016-02-26 DIAGNOSIS — K56609 Unspecified intestinal obstruction, unspecified as to partial versus complete obstruction: Secondary | ICD-10-CM

## 2016-02-26 DIAGNOSIS — Z23 Encounter for immunization: Secondary | ICD-10-CM

## 2016-02-26 DIAGNOSIS — I428 Other cardiomyopathies: Secondary | ICD-10-CM

## 2016-02-26 DIAGNOSIS — F411 Generalized anxiety disorder: Secondary | ICD-10-CM | POA: Diagnosis not present

## 2016-02-26 DIAGNOSIS — I5022 Chronic systolic (congestive) heart failure: Secondary | ICD-10-CM | POA: Diagnosis not present

## 2016-02-26 DIAGNOSIS — E785 Hyperlipidemia, unspecified: Secondary | ICD-10-CM

## 2016-02-26 LAB — POCT URINALYSIS DIP (MANUAL ENTRY)
Bilirubin, UA: NEGATIVE
GLUCOSE UA: NEGATIVE
Ketones, POC UA: NEGATIVE
NITRITE UA: NEGATIVE
PROTEIN UA: NEGATIVE
SPEC GRAV UA: 1.01
UROBILINOGEN UA: 0.2
pH, UA: 5

## 2016-02-26 MED ORDER — CITALOPRAM HYDROBROMIDE 20 MG PO TABS: 20.0000 mg | ORAL_TABLET | Freq: Every day | ORAL | 1 refills | Status: DC

## 2016-02-26 NOTE — Patient Instructions (Signed)
     IF you received an x-ray today, you will receive an invoice from [facility]. Please contact [facility] at [phone] with questions or concerns regarding your invoice.   IF you received labwork today, you will receive an invoice from [facility]. Please contact [facility] at [phone] with questions or concerns regarding your invoice.   Our billing staff will not be able to assist you with questions regarding bills from these companies.  You will be contacted with the lab results as soon as they are available. The fastest way to get your results is to activate your My Chart account. Instructions are located on the last page of this paperwork. If you have not heard from us regarding the results in 2 weeks, please contact this office.      

## 2016-02-26 NOTE — Progress Notes (Signed)
Patient ID: Teresa [name], female   DOB: 1941/02/15, 75 y.o.   MRN: [id number]   By signing my name below I, Teresa [name], attest that this documentation has been prepared under the direction and in the presence of Wardell Honour MD. Electonically Signed. Teresa [name], Scribe 02/26/2016 at 6:30 PM  Subjective:    Patient ID: Teresa [name], female    DOB: [birth year], 75 y.o.   MRN: [id number]  02/26/2016  Medication Refill (xanax/ patient states that she is not here for physical); Other (weight gain); and Other (check thyroid)   HPI Teresa [name] is a 75 y.o. female who presents to the [facility] for medication refill and chronic medical follow-up.   Pt denies any CP, palpitations, SOB at night. Pt states she has SOB when she walks for a long time. Pt reports that she has been having regular BMs.  Pt reports significant weight gain within the past few years. Pt states that she has not been eating big portions. Pt states that she has trouble walking for more than 5 minutes at a time. Pt states she does not have the energy to exercise. Pt also states that she has difficultly getting out and going grocery shopping.  Pt also c/o back pain. [name] pain doctor told the pt that she may have lumbar stenosis. Pt states that she takes norco/vicodin BID. Pt also reports that she takes xanax at night to help her sleep. Pt takes 2 xanax at bedtime.   Cardiomyopathy and CHF. Pt was last seen and evaluated by Cardiology on 09/28/15. At that time pt was c/o SOB. [name] EF was improved at that time and symptoms were believed to be secondary to pt's increasing obesity. Pt is going to follow up with her cardiologist within the next month. Pt does not take lasix on a regular basis and only takes it when she notices her feet are swollen.   Pt was last seen and evaluated by Dr Tamala Julian at [facility] on 08/21/15. At that time, pt's thyroid was over corrected and synthroid was decreased. Pt reports that she has  been "staying cold". Since thyroid medication was changed.   Pt had a CT abd/pelv w/ contrast on 08/30/15. CT found a 13 mm structure on pancreas and was referred to MRI for further evaluation. [name] MRI of abd on 09/18/15 showed 14 mm unilocular cystic lesion along the posterior aspect of the pancreatic tail. [name] ventral hernia remains unchanged.   Pt reports that she has lost her husband, her son, and her job, and she feels like she has lost her purpose, so she does not care about taking care of herself. Pt believes she is depressed. Pt denies wanting to die or having any suicidal thoughts. Pt has 3 living children. Pt has an estranged daughter. Another Daughter who lives in [place]. Pt also has a son who comes and cooks for her every other day and helps her with grocery shopping.   Pt states she has 2 friends that make her go out a few times a month.   Wt Readings from Last 3 Encounters:  03/26/16 207 lb (93.9 kg)  02/26/16 213 lb (96.6 kg)  09/28/15 199 lb 1.9 oz (90.3 kg)   BP Readings from Last 3 Encounters:  03/26/16 110/80  02/26/16 134/80  09/28/15 (!) 152/98     Review of Systems  Constitutional: Negative for activity change, appetite change, chills, diaphoresis, fatigue, fever and unexpected weight change.  HENT: Negative  for congestion, dental problem, drooling, ear discharge, ear pain, facial swelling, hearing loss, mouth sores, nosebleeds, postnasal drip, rhinorrhea, sinus pressure, sneezing, sore throat, tinnitus, trouble swallowing and voice change.   Eyes: Negative for photophobia, pain, discharge, redness, itching and visual disturbance.  Respiratory: Negative for apnea, cough, choking, chest tightness, shortness of breath, wheezing and stridor.   Cardiovascular: Negative for chest pain, palpitations and leg swelling.  Gastrointestinal: Negative for abdominal distention, abdominal pain, anal bleeding, blood in stool, constipation, diarrhea, nausea, rectal pain and vomiting.    Endocrine: Negative for cold intolerance, heat intolerance, polydipsia, polyphagia and polyuria.  Genitourinary: Negative for decreased urine volume, difficulty urinating, dyspareunia, dysuria, enuresis, flank pain, frequency, genital sores, hematuria, menstrual problem, pelvic pain, urgency, vaginal bleeding, vaginal discharge and vaginal pain.  Musculoskeletal: Negative for arthralgias, back pain, gait problem, joint swelling, myalgias, neck pain and neck stiffness.  Skin: Negative for color change, pallor, rash and wound.  Allergic/Immunologic: Negative for environmental allergies, food allergies and immunocompromised state.  Neurological: Negative for dizziness, tremors, seizures, syncope, facial asymmetry, speech difficulty, weakness, light-headedness, numbness and headaches.  Hematological: Negative for adenopathy. Does not bruise/bleed easily.  Psychiatric/Behavioral: Positive for dysphoric mood. Negative for agitation, behavioral problems, confusion, decreased concentration, hallucinations, self-injury, sleep disturbance and suicidal ideas. The patient is not nervous/anxious and is not hyperactive.     Past Medical History:  Diagnosis Date  . Abdominal pain   . Anxiety    maintained on Xanax tid for years.  . Arthritis    FEET, KNEES, HANDS  . Chronic systolic CHF (congestive heart failure), NYHA class 2 ([place])    EF 25% 2011 MV,  50-55% echo 6/13  . Diabetes mellitus type 2, diet-controlled ([place])   . GERD (gastroesophageal reflux disease)   . Glaucoma    s/p surgery B in 30s.  [place].  . H/O hiatal hernia   . History of glaucoma   . History of small bowel obstruction 2009 AND NOV 2012  . HTN (hypertension)   . Hypothyroidism   . Left ovarian cyst   . Moderate mitral regurgitation   . Moderate tricuspid regurgitation   . Non-healing surgical wound    ABDOMINAL S/P EXPLORATOY LAPAROTOMY 04-18-2011  . Nonischemic cardiomyopathy ([place]) [facility]    EF is 25% per echo February  2012, non ischemic myoview 12/2009.  EF 50% echo 7/12 and plans for ICD cancelled.   . Peptic ulcer disease   . PVC (premature ventricular contraction)   . Shortness of breath   . Spinal stenosis   . Urge urinary incontinence   . Valvular heart disease    Moderate to severe MR, moderate TR, LAE per echo 7/11  . Wears dentures    upper denture-lower partial   Past Surgical History:  Procedure Laterality Date  . ABDOMINAL HERNIA REPAIR  32 YRS AGO   PERITINITIS  . APPENDECTOMY  1979   RUPTURED  . BENIGN RIGHT BREAST TUMOR REMOVED    . CARDIOVASCULAR STRESS TEST  JULY 2011-  DR HOCHREIN   NO ISCHEMIA  . EXPLORATOMY LAP. / EXTENSIVE LYSIS ADHESIONS/ SMALL BOWEL RESECTION X2 WITH PRIMARY ANASTOMOSIS X2  04-18-2011   SMALL BOWEL OBSTRUCTION  . GLAUCOMA SURGERY  1978  . HERNIA REPAIR    . INCISION AND DRAINAGE OF WOUND N/A 09/13/2012   Procedure: INCISION  AND DEBRIDEMENT WOUND abdominal wall ;  Surgeon: Rolm Bookbinder, MD;  Location: [place];  Service: General;  Laterality: N/A;  coordination with Dr Migdalia Dk   .  KNEE ARTHROSCOPY W/ MENISCECTOMY  06-05-2004  . THYROIDECTOMY  1978 GOITOR   AND CERVICAL COLD KNIFE CONE BX  . TONSILLECTOMY  AGE 20  . TRANSTHORACIC ECHOCARDIOGRAM  12-27-2010   EF 45-50%/ MODERATE MV REGURG. / LEFT ATRIUM MILDLY DILATED/ MILD TRICUSPID REGURG.  . TUBAL LIGATION    . VENTRAL HERNIA REPAIR  X4  LAST ONE 20 YRS AGO   ABDOMINAL --  EACH ONE IN DIFFERENT AREA  . VENTRAL HERNIA REPAIR  01/29/2012   Procedure: HERNIA REPAIR VENTRAL ADULT;  Surgeon: Theodoro Kos, DO;  Location: [place];  Service: Plastics;  Laterality: N/A;  . WOUND DEBRIDEMENT  09/25/2011   Procedure: DEBRIDEMENT CLOSURE/ABDOMINAL WOUND;  Surgeon: Theodoro Kos, DO;  Location: [place];  Service: Plastics;  Laterality: N/A;  excision of abdominal wound with primary closure   Allergies  Allergen Reactions  . Aspirin Other (See Comments)    HX Bleeding Ulcer     . Nsaids     Hx of bleeding ulcers  . Tolmetin Rash    Hx of bleeding ulcers    Social History   Social History  . Marital status: Widowed    Spouse name: N/A  . Number of children: 2  . Years of education: N/A   Occupational History  . RETIRED    Social History Main Topics  . Smoking status: Former Smoker    Years: 30.00    Types: Cigarettes    Quit date: 09/22/2010  . Smokeless tobacco: Never Used     Comment: quit September 18, 2009  . Alcohol use No  . Drug use: No  . Sexual activity: Not Currently   Other Topics Concern  . Not on file   Social History Narrative   Marital status:  Widowed as of 09-19-2010; not dating.        Children: four children (1 daughter in [place], 1 daughter in [place] estranged; 1 son in [place]; 1 son deceased in 2014/09/19); six grandchildren; 5 gg.      Lives: alone with a yorkie.  Son/Harry lives close.        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      Tobacco:  None; quit in 09/19/2010.        Alcohol: none      Drugs:none      Exercise:  No formal exercise program.  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  Cannot swim.        ADLs:  Driving; cleans own house; does grocery shopping; pays bills; does not use cane for ambulation.  Drives.        Advanced Directives: no living will; no HCPOA.  DNR/DNI.     Family History  Problem Relation Age of Onset  . Stroke Brother   . Stroke Sister   . Heart disease Sister        Objective:    BP 134/80 (BP Location: Right Arm, Patient Position: Sitting, Cuff Size: Large)   Pulse 84   Temp 98 F (36.7 C) (Oral)   Resp 18   Ht 5\' 3"  (1.6 m)   Wt 213 lb (96.6 kg)   SpO2 96%   BMI 37.73 kg/m  Physical Exam  Constitutional: She is oriented to person, place, and time. She appears well-developed and well-nourished. No distress.  HENT:  Head: Normocephalic and atraumatic.  Right Ear: External ear normal. 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  Left Ear: External ear  normal.  Nose: Nose normal.  Mouth/Throat: Oropharynx is clear and moist.  Pt has some cerumen in rt ear canal.  Eyes: Conjunctivae and EOM are normal. Pupils are equal, round, and reactive to light.  Neck: Normal range of motion. Neck supple. Carotid bruit is not present. No thyromegaly present.  Cardiovascular: Normal rate, regular rhythm, normal heart sounds and intact distal pulses.  Exam reveals no gallop and no friction rub.   No murmur heard. Pulmonary/Chest: Effort normal and breath sounds normal. She has no wheezes. She has no rales.  Abdominal: Soft. Bowel sounds are normal. She exhibits no distension and no mass. There is no tenderness. There is no rebound and no guarding.  Lymphadenopathy:    She has no cervical adenopathy.  Neurological: She is alert and oriented to person, place, and time. No cranial nerve deficit.  Skin: Skin is warm and dry. No rash noted. She is not diaphoretic. No erythema. No pallor.  Psychiatric: She has a normal mood and affect. Her behavior is normal.  Nursing note and vitals reviewed.  Fall Risk  08/21/2015 02/12/2015 08/30/2014 03/01/2014 09/12/2013  Falls in the past year? No No Yes No No  Number falls in past yr: - - 1 - -  Injury with Fall? - - No - -   Depression screen [facility] 2/9 08/21/2015 02/12/2015 08/30/2014 03/01/2014 09/12/2013  Decreased Interest 0 0 0 0 0  Down, Depressed, Hopeless 0 0 0 0 0  PHQ - 2 Score 0 0 0 0 0   Functional Status Survey: Is the patient deaf or have difficulty hearing?: Yes Does the patient have difficulty seeing, even when wearing glasses/contacts?: No Does the patient have difficulty concentrating, remembering, or making decisions?: No Does the patient have difficulty walking or climbing stairs?: No Does the patient have difficulty dressing or bathing?: No Does the patient have difficulty doing errands alone such as visiting a doctor's office or shopping?: No     Assessment & Plan:   1. Encounter for Medicare annual  wellness exam   2. Nonischemic cardiomyopathy ([place])   3. Essential hypertension   4. Mitral regurgitation   5. Tricuspid regurgitation   6. Chronic systolic CHF (congestive heart failure), NYHA class 2 ([place])   7. SBO (small bowel obstruction), s/p expl lap   8. Gastroesophageal reflux disease without esophagitis   9. Hypothyroidism due to acquired atrophy of thyroid   10. Diabetes mellitus type 2, diet-controlled ([place])   11. Primary osteoarthritis of both hands   12. Chronic renal insufficiency, stage 1   13. Generalized anxiety disorder   14. Dyslipidemia   15. Urge urinary incontinence   16. Need for prophylactic vaccination and inoculation against influenza    -anticipatory guidance provided  -obtain labs. -refills provided. -discussed current CDC guidelines to avoid benzos with daily chronic opiate use.  Wean Xanax over upcoming three months; start Celexa 20mg  daily for depression/anxiety. -independent with ADLs; undergoing treatment for anxiety/depression; moderate fall risk due to high risk medications. -needs repeat MRI abdomen in 08/2016 to follow-up on  Pancreatic pseudocyst.   Orders Placed This Encounter  Procedures  . Flu Vaccine QUAD 36+ mos IM  . CBC with Differential/Platelet  . Comprehensive metabolic panel    Order Specific Question:   Has the patient fasted?    Answer:   Yes  . Lipid panel    Order Specific Question:   Has the patient fasted?    Answer:   Yes  . Hemoglobin A1c  . TSH  .  T4, free  . Microalbumin, urine  . POCT urinalysis dipstick   Meds ordered this encounter  Medications  . citalopram (CELEXA) 20 MG tablet    Sig: Take 1 tablet (20 mg total) by mouth daily.    Dispense:  90 tablet    Refill:  1    Return in about 2 months (around 04/27/2016) for recheck depression and anxiety.    I personally performed the services described in this documentation, which was scribed in my presence. The recorded information has been reviewed and  considered.  Markee Remlinger Elayne Guerin, M.D. [facility] [address] [place], [place]  [address] [phone] phone [phone] fax

## 2016-02-27 ENCOUNTER — Encounter: Payer: Self-pay | Admitting: *Deleted

## 2016-02-27 LAB — COMPREHENSIVE METABOLIC PANEL
ALK PHOS: 74 U/L (ref 33–130)
ALT: 15 U/L (ref 6–29)
AST: 18 U/L (ref 10–35)
Albumin: 4 g/dL (ref 3.6–5.1)
BILIRUBIN TOTAL: 0.4 mg/dL (ref 0.2–1.2)
BUN: 24 mg/dL (ref 7–25)
CALCIUM: 9.3 mg/dL (ref 8.6–10.4)
CO2: 24 mmol/L (ref 20–31)
Chloride: 108 mmol/L (ref 98–110)
Creat: 0.98 mg/dL — ABNORMAL HIGH (ref 0.60–0.93)
GLUCOSE: 124 mg/dL — AB (ref 65–99)
Potassium: 5.2 mmol/L (ref 3.5–5.3)
SODIUM: 141 mmol/L (ref 135–146)
Total Protein: 6.6 g/dL (ref 6.1–8.1)

## 2016-02-27 LAB — CBC WITH DIFFERENTIAL/PLATELET
BASOS PCT: 1 %
Basophils Absolute: 58 cells/uL (ref 0–200)
EOS PCT: 2 %
Eosinophils Absolute: 116 cells/uL (ref 15–500)
HCT: 37.7 % (ref 35.0–45.0)
Hemoglobin: 12.5 g/dL (ref 11.7–15.5)
LYMPHS PCT: 25 %
Lymphs Abs: 1450 cells/uL (ref 850–3900)
MCH: 32 pg (ref 27.0–33.0)
MCHC: 33.2 g/dL (ref 32.0–36.0)
MCV: 96.4 fL (ref 80.0–100.0)
MONO ABS: 464 {cells}/uL (ref 200–950)
MONOS PCT: 8 %
MPV: 10.2 fL (ref 7.5–12.5)
NEUTROS PCT: 64 %
Neutro Abs: 3712 cells/uL (ref 1500–7800)
PLATELETS: 196 10*3/uL (ref 140–400)
RBC: 3.91 MIL/uL (ref 3.80–5.10)
RDW: 13.2 % (ref 11.0–15.0)
WBC: 5.8 10*3/uL (ref 3.8–10.8)

## 2016-02-27 LAB — LIPID PANEL
Cholesterol: 223 mg/dL — ABNORMAL HIGH (ref 125–200)
HDL: 35 mg/dL — AB (ref >=46)
LDL CALC: 155 mg/dL — AB (ref <130)
Total CHOL/HDL Ratio: 6.4 Ratio — ABNORMAL HIGH (ref <=5.0)
Triglycerides: 166 mg/dL — ABNORMAL HIGH (ref <150)
VLDL: 33 mg/dL — ABNORMAL HIGH (ref <30)

## 2016-02-27 LAB — MICROALBUMIN, URINE: MICROALB UR: 0.4 mg/dL

## 2016-02-27 LAB — TSH: TSH: 1.7 mIU/L

## 2016-02-27 LAB — HEMOGLOBIN A1C
HEMOGLOBIN A1C: 5.5 % (ref <5.7)
MEAN PLASMA GLUCOSE: 111 mg/dL

## 2016-02-27 LAB — T4, FREE: FREE T4: 1.1 ng/dL (ref 0.8–1.8)

## 2016-03-10 ENCOUNTER — Telehealth: Payer: Self-pay

## 2016-03-10 NOTE — Telephone Encounter (Signed)
Pt had an appointment on 02-26-16 and had labs done.  She has not heard anything from [place] by phone or letter.  Call [phone]

## 2016-03-10 NOTE — Telephone Encounter (Signed)
Spoke with pt about labs. 

## 2016-03-11 ENCOUNTER — Other Ambulatory Visit: Payer: Self-pay | Admitting: Nurse Practitioner

## 2016-03-25 NOTE — Progress Notes (Signed)
CARDIOLOGY OFFICE NOTE  Date:  03/26/2016    Teresa [name] Date of Birth: [birth year] Medical Record #[id number]  PCP:  Reginia Forts, MD  Cardiologist:  Servando Snare Hochrein    Chief Complaint  Patient presents with  . Cardiomyopathy    6 month check - seen for Dr. Percival Spanish    History of Present Illness: Teresa [name] is a 75 y.o. female who presents today for a follow up visit. This is a 6 month check. Seen for Dr. Percival Spanish - former patient of Dr. Susa Simmonds.   Has a nonischemic CM. Past EF down to 25%. EF improved with medicines. Plans for an ICD in the past were cancelled due to recovery of her EF. Other issues include tobacco abuse, HTN, PVCs, DM, valvular heart disease, spinal stenosis and glaucoma. Had a small bowel obstruction back in November of 2012 and had surgery with delayed wound healing. She does not have any known CAD.   I saw her back in April of 2016 - more weight gain - more short of breath. Updated her echo.This was ok. Referred for PFTs but she did not follow thru.   Seen by me back in 2015-05-08 - her son had died. Lots of grief and stress. More short of breath. Finally got a CXR on her - she still did not proceed with PFTs. CXR with carotid calcification - got a doppler which was basically normal.   Last seen by me back in April. Weight continued to climb. Breathing was stable.   Comes in today. Here alone. She is not doing well. Staying scared - not really clear why. She has been placed on Celexa - has been on about a month. This may be getting worse. She is getting more and more sedentary - very hard for her to get around. Her son has to do her shopping now. She never got her breathing tests. She is more and more short of breath. Had a very hard time getting in here today. She does not like coming here - reminds her too much of her other son who passed away - he was a patient here and had a really hard/complicated course. No chest pain. Still with  lots of belly issues. No real swelling. Has had recent labs - those are reviewed. No recent BNP.    Past Medical History:  Diagnosis Date  . Abdominal pain   . Anxiety    maintained on Xanax tid for years.  . Arthritis    FEET, KNEES, HANDS  . Chronic systolic CHF (congestive heart failure), NYHA class 2 ([place])    EF 25% 2011 MV,  50-55% echo 6/13  . Diabetes mellitus type 2, diet-controlled ([place])   . GERD (gastroesophageal reflux disease)   . Glaucoma    s/p surgery B in 30s.  [place].  . H/O hiatal hernia   . History of glaucoma   . History of small bowel obstruction 2009 AND NOV 2012  . HTN (hypertension)   . Hypothyroidism   . Left ovarian cyst   . Moderate mitral regurgitation   . Moderate tricuspid regurgitation   . Non-healing surgical wound    ABDOMINAL S/P EXPLORATOY LAPAROTOMY 04-18-2011  . Nonischemic cardiomyopathy ([place]) DR [facility]    EF is 25% per echo February 2012, non ischemic myoview 12/2009.  EF 50% echo 7/12 and plans for ICD cancelled.   . Peptic ulcer disease   . PVC (premature ventricular contraction)   . Shortness of  breath   . Spinal stenosis   . Urge urinary incontinence   . Valvular heart disease    Moderate to severe MR, moderate TR, LAE per echo 7/11  . Wears dentures    upper denture-lower partial    Past Surgical History:  Procedure Laterality Date  . ABDOMINAL HERNIA REPAIR  32 YRS AGO   PERITINITIS  . APPENDECTOMY  1979   RUPTURED  . BENIGN RIGHT BREAST TUMOR REMOVED    . CARDIOVASCULAR STRESS TEST  JULY 2011-  DR HOCHREIN   NO ISCHEMIA  . EXPLORATOMY LAP. / EXTENSIVE LYSIS ADHESIONS/ SMALL BOWEL RESECTION X2 WITH PRIMARY ANASTOMOSIS X2  04-18-2011   SMALL BOWEL OBSTRUCTION  . GLAUCOMA SURGERY  1978  . HERNIA REPAIR    . INCISION AND DRAINAGE OF WOUND N/A 09/13/2012   Procedure: INCISION  AND DEBRIDEMENT WOUND abdominal wall ;  Surgeon: Rolm Bookbinder, MD;  Location: [facility];  Service: General;  Laterality: N/A;  coordination with Dr  Migdalia Dk   . KNEE ARTHROSCOPY W/ MENISCECTOMY  06-05-2004  . THYROIDECTOMY  1978 GOITOR   AND CERVICAL COLD KNIFE CONE BX  . TONSILLECTOMY  AGE 50  . TRANSTHORACIC ECHOCARDIOGRAM  12-27-2010   EF 45-50%/ MODERATE MV REGURG. / LEFT ATRIUM MILDLY DILATED/ MILD TRICUSPID REGURG.  . TUBAL LIGATION    . VENTRAL HERNIA REPAIR  X4  LAST ONE 20 YRS AGO   ABDOMINAL --  EACH ONE IN DIFFERENT AREA  . VENTRAL HERNIA REPAIR  01/29/2012   Procedure: HERNIA REPAIR VENTRAL ADULT;  Surgeon: Theodoro Kos, DO;  Location: [place] [facility];  Service: Plastics;  Laterality: N/A;  . WOUND DEBRIDEMENT  09/25/2011   Procedure: DEBRIDEMENT CLOSURE/ABDOMINAL WOUND;  Surgeon: Theodoro Kos, DO;  Location: [facility];  Service: Plastics;  Laterality: N/A;  excision of abdominal wound with primary closure     Medications: Current Outpatient Prescriptions  Medication Sig Dispense Refill  . ALPRAZolam (XANAX) 0.25 MG tablet Take 1 tablet (0.25 mg total) by mouth 3 (three) times daily as needed for anxiety. 90 tablet 5  . carvedilol (COREG) 12.5 MG tablet TAKE 1 TABLET TWICE DAILY WITH A MEAL. 60 tablet 9  . citalopram (CELEXA) 20 MG tablet Take 1 tablet (20 mg total) by mouth daily. 90 tablet 1  . furosemide (LASIX) 40 MG tablet Take 1 tablet (40 mg total) by mouth as needed for fluid or edema. 30 tablet 11  . HYDROcodone-acetaminophen (NORCO/VICODIN) 5-325 MG tablet Take 1 tablet by mouth every 6 (six) hours as needed for moderate pain (back).    Marland Kitchen levothyroxine (SYNTHROID, LEVOTHROID) 75 MCG tablet TAKE  1 TABLET ONCE DAILY BEFORE BREAKFAST 30 tablet 5  . losartan (COZAAR) 25 MG tablet Take 1 tablet (25 mg total) by mouth daily. 30 tablet 3  . pantoprazole (PROTONIX) 40 MG tablet Take 1 tablet (40 mg total) by mouth daily. 90 tablet 3  . spironolactone (ALDACTONE) 25 MG tablet Take 0.5 tablets (12.5 mg total) by mouth at bedtime. 30 tablet 11   No current facility-administered medications for  this visit.     Allergies: Allergies  Allergen Reactions  . Aspirin Other (See Comments)    HX Bleeding Ulcer   . Nsaids     Hx of bleeding ulcers  . Tolmetin Rash    Hx of bleeding ulcers    Social History: The patient  reports that she quit smoking about 5 years ago. Her smoking use included Cigarettes. She quit after 30.00 years  of use. She has never used smokeless tobacco. She reports that she does not drink alcohol or use drugs.   Family History: The patient's family history includes Heart disease in her sister; Stroke in her brother and sister.   Review of Systems: Please see the history of present illness.   Otherwise, the review of systems is positive for none.   All other systems are reviewed and negative.   Physical Exam: VS:  BP 110/80   Pulse 80   Ht 5\' 3"  (1.6 m)   Wt 207 lb (93.9 kg)   SpO2 97% Comment: after walking in  BMI 36.67 kg/m  .  BMI Body mass index is 36.67 kg/m.  Wt Readings from Last 3 Encounters:  03/26/16 207 lb (93.9 kg)  02/26/16 213 lb (96.6 kg)  09/28/15 199 lb 1.9 oz (90.3 kg)    General: Pleasant. Obese female who is alert and in no acute distress.  Short of breath with coming in to the office but not hypoxic.  HEENT: Normal.  Neck: Supple, no JVD, carotid bruits, or masses noted.  Cardiac: Regular rate and rhythm. No murmurs, rubs, or gallops. No edema.  Respiratory:  Lungs are clear to auscultation bilaterally with normal work of breathing.  GI: Obese  MS: No deformity or atrophy. Gait and ROM intact but very limited with her breathing.  Skin: Warm and dry. Color is normal.  Neuro:  Strength and sensation are intact and no gross focal deficits noted.  Psych: Alert, appropriate and with normal affect.   LABORATORY DATA:  EKG:  EKG is not ordered today.  Lab Results  Component Value Date   WBC 5.8 02/26/2016   HGB 12.5 02/26/2016   HCT 37.7 02/26/2016   PLT 196 02/26/2016   GLUCOSE 124 (H) 02/26/2016   CHOL 223 (H)  02/26/2016   TRIG 166 (H) 02/26/2016   HDL 35 (L) 02/26/2016   LDLDIRECT 182.5 09/12/2010   LDLCALC 155 (H) 02/26/2016   ALT 15 02/26/2016   AST 18 02/26/2016   NA 141 02/26/2016   K 5.2 02/26/2016   CL 108 02/26/2016   CREATININE 0.98 (H) 02/26/2016   BUN 24 02/26/2016   CO2 24 02/26/2016   TSH 1.70 02/26/2016   INR 1.08 02/04/2013   HGBA1C 5.5 02/26/2016   MICROALBUR 0.4 02/26/2016    BNP (last 3 results) No results for input(s): BNP in the last 8760 hours.  ProBNP (last 3 results) No results for input(s): PROBNP in the last 8760 hours.   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  Follow-up MRI abdomen with/ without contrast is suggested in 12 months.  This recommendation follows ACR consensus guidelines: Managing Incidental Findings on Abdominal CT: White Paper of the ACR Incidental Findings Committee. J Am Coll Radiol 2010;7:754-773.   Electronically Signed  By: Julian Hy M.D.  On: 09/18/2015 12:36  Echo Study Conclusions from 09/2014  - Left ventricle: Posterior basal hypokinesis. Wall thickness was increased in a pattern of mild LVH. Systolic function was normal. The estimated ejection fraction was in the range of 50% to 55%. - Aortic valve: There was mild regurgitation. - Mitral valve: There was mild regurgitation. - Left atrium: The atrium was mildly dilated. - Atrial septum: No defect or patent foramen ovale was identified. - Pericardium, extracardiac: A trivial pericardial effusion was identified posterior to the  heart  Assessment/Plan: 1. Nonischemic CM - her echo was ok from 2016. I still think most of her breathing issues are related to her weight/nonsedentary/past smoking. She has not wanted to proceed with PFTs but is now ready to proceed.   2. Dyspnea/Tobacco  abuse - not smoking over the past 5 years. Breathing is getting worse. She is ready to get her PFTs. Will refer on to pulmonary as well. May need to consider repeat stress testing.   3. Obesity - very limiting for her. I do not see this improving.   4. Fatigue - TSH followed by PCP - recent check ok.   Current medicines are reviewed with the patient today.  The patient does not have concerns regarding medicines other than what has been noted above.  The following changes have been made:  See above.  Labs/ tests ordered today include:    Orders Placed This Encounter  Procedures  . Brain natriuretic peptide  . Ambulatory referral to Pulmonology  . Pulmonary function test     Disposition:   FU with me in 6 months but will be sooner if the above studies are abnormal.   Patient is agreeable to this plan and will call if any problems develop in the interim.   Signed: Burtis Junes, RN, ANP-C 03/26/2016 3:22 PM  [facility] [address] [place], [place]  [address] Phone: [phone] Fax: [phone]

## 2016-03-26 ENCOUNTER — Encounter: Payer: Self-pay | Admitting: Nurse Practitioner

## 2016-03-26 ENCOUNTER — Ambulatory Visit (INDEPENDENT_AMBULATORY_CARE_PROVIDER_SITE_OTHER): Payer: Medicare Other | Admitting: Nurse Practitioner

## 2016-03-26 ENCOUNTER — Encounter (INDEPENDENT_AMBULATORY_CARE_PROVIDER_SITE_OTHER): Payer: Self-pay

## 2016-03-26 ENCOUNTER — Telehealth: Payer: Self-pay

## 2016-03-26 VITALS — BP 110/80 | HR 80 | Ht 63.0 in | Wt 207.0 lb

## 2016-03-26 DIAGNOSIS — R0609 Other forms of dyspnea: Secondary | ICD-10-CM

## 2016-03-26 DIAGNOSIS — I6522 Occlusion and stenosis of left carotid artery: Secondary | ICD-10-CM

## 2016-03-26 DIAGNOSIS — I428 Other cardiomyopathies: Secondary | ICD-10-CM

## 2016-03-26 DIAGNOSIS — R0602 Shortness of breath: Secondary | ICD-10-CM

## 2016-03-26 DIAGNOSIS — R0600 Dyspnea, unspecified: Secondary | ICD-10-CM

## 2016-03-26 MED ORDER — SPIRONOLACTONE 25 MG PO TABS: 12.5000 mg | ORAL_TABLET | Freq: Every day | ORAL | 11 refills | Status: DC

## 2016-03-26 MED ORDER — LOSARTAN POTASSIUM 25 MG PO TABS: 25.0000 mg | ORAL_TABLET | Freq: Every day | ORAL | 3 refills | Status: DC

## 2016-03-26 MED ORDER — FUROSEMIDE 40 MG PO TABS: 40.0000 mg | ORAL_TABLET | ORAL | 11 refills | Status: DC | PRN

## 2016-03-26 NOTE — Patient Instructions (Addendum)
We will be checking the following labs today - BNP   Medication Instructions:    Continue with your current medicines.     Testing/Procedures To Be Arranged:  We will arrange for PFTs with diffusion - hopefully at [place]:   See Dr. Ashok Cordia at [facility]  See me in 6 months    Other Special Instructions:   If your fluid level test comes back elevated - we will get your echocardiogram updated.     If you need a refill on your cardiac medications before your next appointment, please call your pharmacy.   Call the [place] office at [phone] if you have any questions, problems or concerns.

## 2016-03-26 NOTE — Telephone Encounter (Signed)
Pt was scheduled for PFT on 10/12 prior to consult with JN on 11/16- attempted to reschedule in clinic but we have no open PFT appointments before 11/16.    This needs to be rescheduled to hospital prior to consult visit.  Order placed for PFT in hospital.    Will close encounter.

## 2016-03-27 LAB — BRAIN NATRIURETIC PEPTIDE: Brain Natriuretic Peptide: 73.4 pg/mL (ref <100)

## 2016-03-28 ENCOUNTER — Other Ambulatory Visit: Payer: Self-pay | Admitting: *Deleted

## 2016-03-28 ENCOUNTER — Ambulatory Visit: Payer: Medicare Other | Admitting: Nurse Practitioner

## 2016-03-28 DIAGNOSIS — I428 Other cardiomyopathies: Secondary | ICD-10-CM

## 2016-04-02 ENCOUNTER — Other Ambulatory Visit: Payer: Self-pay | Admitting: Family Medicine

## 2016-04-03 ENCOUNTER — Telehealth (HOSPITAL_COMMUNITY): Payer: Self-pay | Admitting: *Deleted

## 2016-04-03 NOTE — Telephone Encounter (Signed)
Patient given detailed instructions per Myocardial Perfusion Study Information Sheet for the test on 04/08/16. Patient notified to arrive 15 minutes early and that it is imperative to arrive on time for appointment to keep from having the test rescheduled.  If you need to cancel or reschedule your appointment, please call the office within 24 hours of your appointment. Failure to do so may result in a cancellation of your appointment, and a $50 no show fee. Patient verbalized understanding. Kirstie Peri

## 2016-04-08 ENCOUNTER — Ambulatory Visit (HOSPITAL_COMMUNITY): Payer: Medicare Other | Attending: Cardiology

## 2016-04-08 DIAGNOSIS — Z8249 Family history of ischemic heart disease and other diseases of the circulatory system: Secondary | ICD-10-CM | POA: Insufficient documentation

## 2016-04-08 DIAGNOSIS — R002 Palpitations: Secondary | ICD-10-CM | POA: Diagnosis not present

## 2016-04-08 DIAGNOSIS — R0609 Other forms of dyspnea: Secondary | ICD-10-CM | POA: Insufficient documentation

## 2016-04-08 DIAGNOSIS — I428 Other cardiomyopathies: Secondary | ICD-10-CM | POA: Insufficient documentation

## 2016-04-08 LAB — MYOCARDIAL PERFUSION IMAGING
LV dias vol: 88 mL (ref 46–106)
LV sys vol: 40 mL
Peak HR: 92 {beats}/min
RATE: 0.26
Rest HR: 72 {beats}/min
SDS: 3
SRS: 2
SSS: 5
TID: 0.89

## 2016-04-08 MED ORDER — TECHNETIUM TC 99M TETROFOSMIN IV KIT
31.5000 | PACK | Freq: Once | INTRAVENOUS | Status: AC | PRN
  Administered 2016-04-08: 31.5 via INTRAVENOUS
  Filled 2016-04-08: qty 32

## 2016-04-08 MED ORDER — TECHNETIUM TC 99M TETROFOSMIN IV KIT
11.0000 | PACK | Freq: Once | INTRAVENOUS | Status: AC | PRN
  Administered 2016-04-08: 11 via INTRAVENOUS
  Filled 2016-04-08: qty 11

## 2016-04-08 MED ORDER — REGADENOSON 0.4 MG/5ML IV SOLN
0.4000 mg | Freq: Once | INTRAVENOUS | Status: AC
  Administered 2016-04-08: 0.4 mg via INTRAVENOUS

## 2016-04-23 ENCOUNTER — Telehealth: Payer: Self-pay | Admitting: *Deleted

## 2016-04-23 ENCOUNTER — Ambulatory Visit (INDEPENDENT_AMBULATORY_CARE_PROVIDER_SITE_OTHER): Payer: Medicare Other | Admitting: Family Medicine

## 2016-04-23 ENCOUNTER — Encounter: Payer: Self-pay | Admitting: Family Medicine

## 2016-04-23 VITALS — BP 138/86 | HR 81 | Temp 97.6°F | Resp 16 | Ht 63.5 in | Wt 207.2 lb

## 2016-04-23 DIAGNOSIS — M791 Myalgia, unspecified site: Secondary | ICD-10-CM

## 2016-04-23 DIAGNOSIS — F419 Anxiety disorder, unspecified: Principal | ICD-10-CM

## 2016-04-23 DIAGNOSIS — F418 Other specified anxiety disorders: Secondary | ICD-10-CM | POA: Diagnosis not present

## 2016-04-23 DIAGNOSIS — F5104 Psychophysiologic insomnia: Secondary | ICD-10-CM

## 2016-04-23 DIAGNOSIS — M25511 Pain in right shoulder: Secondary | ICD-10-CM

## 2016-04-23 DIAGNOSIS — M25512 Pain in left shoulder: Secondary | ICD-10-CM

## 2016-04-23 DIAGNOSIS — F329 Major depressive disorder, single episode, unspecified: Secondary | ICD-10-CM

## 2016-04-23 DIAGNOSIS — I6522 Occlusion and stenosis of left carotid artery: Secondary | ICD-10-CM | POA: Diagnosis not present

## 2016-04-23 DIAGNOSIS — F32A Depression, unspecified: Secondary | ICD-10-CM

## 2016-04-23 MED ORDER — ALPRAZOLAM 0.25 MG PO TABS: 0.2500 mg | ORAL_TABLET | Freq: Every day | ORAL | 3 refills | Status: DC

## 2016-04-23 NOTE — Telephone Encounter (Signed)
Called voice-mail RX to [facility] for Xanax 0.25 mg to take at bedtime, per Dr Tamala Julian.

## 2016-04-23 NOTE — Patient Instructions (Addendum)
1.  Decrease Alprazolam to 1/2 at bedtime. 2. Continue Citalopram one daily for depression and anxiety.     IF you received an x-ray today, you will receive an invoice from [facility]. Please contact [facility] at [phone] with questions or concerns regarding your invoice.   IF you received labwork today, you will receive an invoice from [employer]. Please contact [facility] at [phone] with questions or concerns regarding your invoice.   Our billing staff will not be able to assist you with questions regarding bills from these companies.  You will be contacted with the lab results as soon as they are available. The fastest way to get your results is to activate your My Chart account. Instructions are located on the last page of this paperwork. If you have not heard from [place] regarding the results in 2 weeks, please contact this office.

## 2016-04-23 NOTE — Progress Notes (Signed)
Subjective:    Patient ID: Teresa [name], female    DOB: May 22, 1941, 75 y.o.   MRN: [id number]  04/23/2016  Follow-up (medication - Celexa)   HPI This 75 y.o. female presents for evaluation of anxiety.    B arm pain: pain with elevation; with walking, having upper leg pain.  Wondering if due to new medication/Celexa.  Called pharmacy to see if possible side effect but recommended discussing with PCP.  If slowly exercises, arms loosen up.  Proximal arms B.  S/p stress testing in past two weeks and normal.  Two days ago, did have jaw pain; remembered that husband had jaw pain; he was being treated for arthritis for three months prior to CABG.  When pt sits still, having no pain; movement causes pain in arms.  Some days, if sits for a long time gets stiff.  During the day, if keeps moving, muscles loosen up.  Trying to turn over at night in bed; rolls over on stomach.  Cannot do that. Yesterday, not as severe, in agony. Fourth week of it.  Never a days without it.    When first started taking Celexa, suffered with myoclonic jerking. Discussed with pharmacist, recommended taking in morning.  Then needed to help with sleep, no longer having jerking as often. On Alprazolam, can trust with 1/2 alprazolam.  Has tried without anything, cannot sleep.  Has weaned down from 3 alprazolam per day to 1/2 qhs.  Requesting to take 1/2 qhs.  Citalopram has helped patient with depression; no longer having negative thought.  Wants to get oer body pain so that she can be active; trying to do physical therapy.  Has given up sugar.  Has lost 5 pounds. Was having panic attacks.  Using cane to ambulate.  Wt Readings from Last 3 Encounters:  05/01/16 207 lb (93.9 kg)  04/23/16 207 lb 3.2 oz (94 kg)  04/08/16 207 lb (93.9 kg)   BP Readings from Last 3 Encounters:  05/01/16 128/88  04/23/16 138/86  03/26/16 110/80   Immunization History  Administered Date(s) Administered  . Influenza,inj,Quad PF,36+ Mos 04/20/2013,  02/26/2016  . Pneumococcal Conjugate-13 03/01/2014  . Pneumococcal-Unspecified 06/16/2010    Goes 11/13 for PFTs; then goes to see pulmonologist.  Cardiology referred to pulmonology.  Still having DOE.  No cardiac source to DOE.   Review of Systems  Constitutional: Negative for chills, diaphoresis, fatigue and fever.  Eyes: Negative for visual disturbance.  Respiratory: Negative for cough and shortness of breath.   Cardiovascular: Negative for chest pain, palpitations and leg swelling.  Gastrointestinal: Negative for abdominal pain, constipation, diarrhea, nausea and vomiting.  Endocrine: Negative for cold intolerance, heat intolerance, polydipsia, polyphagia and polyuria.  Musculoskeletal: Positive for myalgias.  Neurological: Negative for dizziness, tremors, seizures, syncope, facial asymmetry, speech difficulty, weakness, light-headedness, numbness and headaches.  Psychiatric/Behavioral: Positive for dysphoric mood and sleep disturbance. Negative for self-injury and suicidal ideas. The patient is nervous/anxious.     Past Medical History:  Diagnosis Date  . Abdominal pain   . Anxiety    maintained on Xanax tid for years.  . Arthritis    FEET, KNEES, HANDS  . Chronic systolic CHF (congestive heart failure), NYHA class 2 ([place])    EF 25% 2011 MV,  50-55% echo 6/13  . Diabetes mellitus type 2, diet-controlled ([place])   . GERD (gastroesophageal reflux disease)   . Glaucoma    s/p surgery B in 30s.  [place].  . H/O hiatal hernia   .  History of glaucoma   . History of small bowel obstruction 2009 AND NOV 2012  . HTN (hypertension)   . Hypothyroidism   . Left ovarian cyst   . Moderate mitral regurgitation   . Moderate tricuspid regurgitation   . Non-healing surgical wound    ABDOMINAL S/P EXPLORATOY LAPAROTOMY 04-18-2011  . Nonischemic cardiomyopathy ([place]) DR [facility]    EF is 25% per echo February 2012, non ischemic myoview 12/2009.  EF 50% echo 7/12 and plans for ICD cancelled.     . Peptic ulcer disease   . PVC (premature ventricular contraction)   . Shortness of breath   . Spinal stenosis   . Urge urinary incontinence   . Valvular heart disease    Moderate to severe MR, moderate TR, LAE per echo 7/11  . Wears dentures    upper denture-lower partial   Past Surgical History:  Procedure Laterality Date  . ABDOMINAL HERNIA REPAIR  32 YRS AGO   PERITINITIS  . APPENDECTOMY  1979   RUPTURED  . BENIGN RIGHT BREAST TUMOR REMOVED    . CARDIOVASCULAR STRESS TEST  JULY 2011-  DR HOCHREIN   NO ISCHEMIA  . EXPLORATOMY LAP. / EXTENSIVE LYSIS ADHESIONS/ SMALL BOWEL RESECTION X2 WITH PRIMARY ANASTOMOSIS X2  04-18-2011   SMALL BOWEL OBSTRUCTION  . GLAUCOMA SURGERY  1978  . HERNIA REPAIR    . INCISION AND DRAINAGE OF WOUND N/A 09/13/2012   Procedure: INCISION  AND DEBRIDEMENT WOUND abdominal wall ;  Surgeon: Rolm Bookbinder, MD;  Location: [place];  Service: General;  Laterality: N/A;  coordination with Dr Migdalia Dk   . KNEE ARTHROSCOPY W/ MENISCECTOMY  06-05-2004  . THYROIDECTOMY  1978 GOITOR   AND CERVICAL COLD KNIFE CONE BX  . TONSILLECTOMY  AGE 50  . TRANSTHORACIC ECHOCARDIOGRAM  12-27-2010   EF 45-50%/ MODERATE MV REGURG. / LEFT ATRIUM MILDLY DILATED/ MILD TRICUSPID REGURG.  . TUBAL LIGATION    . VENTRAL HERNIA REPAIR  X4  LAST ONE 20 YRS AGO   ABDOMINAL --  EACH ONE IN DIFFERENT AREA  . VENTRAL HERNIA REPAIR  01/29/2012   Procedure: HERNIA REPAIR VENTRAL ADULT;  Surgeon: Theodoro Kos, DO;  Location: [place];  Service: Plastics;  Laterality: N/A;  . WOUND DEBRIDEMENT  09/25/2011   Procedure: DEBRIDEMENT CLOSURE/ABDOMINAL WOUND;  Surgeon: Theodoro Kos, DO;  Location: [place];  Service: Plastics;  Laterality: N/A;  excision of abdominal wound with primary closure   Allergies  Allergen Reactions  . Aspirin Other (See Comments)    HX Bleeding Ulcer   . Nsaids     Hx of bleeding ulcers  . Tolmetin Rash    Hx of bleeding ulcers    Current Outpatient Prescriptions  Medication Sig Dispense Refill  . ALPRAZolam (XANAX) 0.25 MG tablet Take 1 tablet (0.25 mg total) by mouth at bedtime. 30 tablet 3  . carvedilol (COREG) 12.5 MG tablet TAKE 1 TABLET TWICE DAILY WITH A MEAL. 60 tablet 9  . citalopram (CELEXA) 20 MG tablet Take 1 tablet (20 mg total) by mouth daily. 90 tablet 1  . furosemide (LASIX) 40 MG tablet Take 1 tablet (40 mg total) by mouth as needed for fluid or edema. 30 tablet 11  . HYDROcodone-acetaminophen (NORCO/VICODIN) 5-325 MG tablet Take 1 tablet by mouth every 6 (six) hours as needed for moderate pain (back).    . losartan (COZAAR) 25 MG tablet Take 1 tablet (25 mg total) by mouth daily. 30 tablet 3  .  pantoprazole (PROTONIX) 40 MG tablet Take 1 tablet (40 mg total) by mouth daily. 90 tablet 3  . spironolactone (ALDACTONE) 25 MG tablet Take 0.5 tablets (12.5 mg total) by mouth at bedtime. 30 tablet 11  . albuterol (PROVENTIL HFA;VENTOLIN HFA) 108 (90 Base) MCG/ACT inhaler Inhale 2 puffs into the lungs every 4 (four) hours as needed for wheezing or shortness of breath. 1 Inhaler 5  . levothyroxine (SYNTHROID, LEVOTHROID) 75 MCG tablet TAKE 1 TABLET ONCE DAILY BEFORE BREAKFAST. 30 tablet 0  . Spacer/Aero-Holding Chambers (AEROCHAMBER MV) inhaler Use as instructed 1 each 0   No current facility-administered medications for this visit.    Social History   Social History  . Marital status: Widowed    Spouse name: N/A  . Number of children: 2  . Years of education: N/A   Occupational History  . RETIRED    Social History Main Topics  . Smoking status: Former Smoker    Packs/day: 1.50    Years: 45.00    Types: Cigarettes    Start date: 11/14/1964    Quit date: 09/22/2010  . Smokeless tobacco: Never Used     Comment: quit 09-03-2009 or 09-04-2010 - Didn't smoke during pregnancies  . Alcohol use No  . Drug use: No  . Sexual activity: Not Currently   Other Topics Concern  . Not on file   Social History Narrative    Marital status:  Widowed as of September 04, 2010; not dating.        Children: four children (1 daughter in [place], 1 daughter in [place] estranged; 1 son in [place]; 1 son deceased in September 04, 2014); six grandchildren; 5 gg.      Lives: alone with a yorkie.  Son/Harry lives close.        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      Tobacco:  None; quit in 09-04-2010.        Alcohol: none      Drugs:none      Exercise:  No formal exercise program.  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  Cannot swim.        ADLs:  Driving; cleans own house; does grocery shopping; pays bills; does not use cane for ambulation.  Drives.        Advanced Directives: no living will; no HCPOA.  DNR/DNI.        [facility]:   Originally from [facility] [place]. Has always lived in [place]. She moved to [place], [place]. 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 Has a dog currently. No bird or mold exposure.    Family History  Problem Relation Age of Onset  . Stroke Brother   . Diabetes Brother   . Tuberculosis Father   . Stroke Mother   . Stroke Sister   . Heart disease Sister   . Emphysema Sister   . Diabetes Daughter        Objective:    BP 138/86 (BP Location: Left Arm, Patient Position: Sitting, Cuff Size: Large)   Pulse 81   Temp 97.6 F (36.4 C) (Oral)   Resp 16   Ht 5' 3.5" (1.613 m)   Wt 207 lb 3.2 oz (94 kg)   SpO2 93%   BMI 36.13 kg/m  Physical Exam  Constitutional: She is oriented to person, place, and time. She appears well-developed and well-nourished. No distress.  HENT:  Head: Normocephalic and atraumatic.  Right Ear: External ear normal.  Left Ear:  External ear normal.  Nose: Nose normal.  Mouth/Throat: Oropharynx is clear and moist.  Eyes: Conjunctivae and EOM are normal. Pupils are equal, round, and reactive to light.  Neck: Normal range of motion. Neck supple. Carotid bruit is not present. No thyromegaly present.  Cardiovascular: Normal  rate, regular rhythm, normal heart sounds and intact distal pulses.  Exam reveals no gallop and no friction rub.   No murmur heard. Pulmonary/Chest: Effort normal and breath sounds normal. She has no wheezes. She has no rales.  Abdominal: Soft. Bowel sounds are normal. She exhibits no distension and no mass. There is no tenderness. There is no rebound and no guarding.  Musculoskeletal:       Right shoulder: She exhibits decreased range of motion and pain. She exhibits no spasm, normal pulse and normal strength.       Left shoulder: She exhibits decreased range of motion and pain. She exhibits no tenderness, no bony tenderness, no spasm, normal pulse and normal strength.       Lumbar back: She exhibits pain. She exhibits normal range of motion, no tenderness and no bony tenderness.       Right upper leg: She exhibits no tenderness and no bony tenderness.       Left upper leg: She exhibits no tenderness and no bony tenderness.  Painful ROM of B upper extremities at proximal arms B and proximal legs B.  Lymphadenopathy:    She has no cervical adenopathy.  Neurological: She is alert and oriented to person, place, and time. No cranial nerve deficit.  Skin: Skin is warm and dry. No rash noted. She is not diaphoretic. No erythema. No pallor.  Psychiatric: She has a normal mood and affect. Her behavior is normal.    Depression screen [facility] 2/9 04/23/2016 08/21/2015 02/12/2015 08/30/2014 03/01/2014  Decreased Interest 0 0 0 0 0  Down, Depressed, Hopeless 0 0 0 0 0  PHQ - 2 Score 0 0 0 0 0   Fall Risk  04/23/2016 08/21/2015 02/12/2015 08/30/2014 03/01/2014  Falls in the past year? No No No Yes No  Number falls in past yr: - - - 1 -  Injury with Fall? - - - No -       Assessment & Plan:   1. Anxiety and depression   2. Myalgia   3. Psychophysiological insomnia   4. Acute pain of both shoulders    -depression much improved with Celexa yet now suffering with proximal myalgias.  Continue Celexa for now;  obtain autoimmune labs including ESR, CK to rule out PR.  If labs negative, will HOLD Citalopram to see if symptoms resolve. No joint involvement; suffering with myalgias. -weaning benzo due to daily narcotic use for DDD lumbar.  Provided with Xanax 0.'25mg'$  qhs  1/2 qhs and then will stop.  Has weaned successfully from tid to 1/2 qhs in the past three months.   Orders Placed This Encounter  Procedures  . CBC with Differential/Platelet  . CK  . Comprehensive metabolic panel  . Sedimentation rate  . ANA  . Rheumatoid factor   Meds ordered this encounter  Medications  . ALPRAZolam (XANAX) 0.25 MG tablet    Sig: Take 1 tablet (0.25 mg total) by mouth at bedtime.    Dispense:  30 tablet    Refill:  3    Return in about 3 months (around 07/24/2016) for recheck.   Jeremaine Maraj Elayne Guerin, M.D. [facility] [place],  [place]  [address] [phone] phone [phone] fax

## 2016-04-24 LAB — CBC WITH DIFFERENTIAL/PLATELET
BASOS ABS: 0 {cells}/uL (ref 0–200)
Basophils Relative: 0 %
Eosinophils Absolute: 84 cells/uL (ref 15–500)
Eosinophils Relative: 1 %
HEMATOCRIT: 37.3 % (ref 35.0–45.0)
HEMOGLOBIN: 12.4 g/dL (ref 11.7–15.5)
LYMPHS ABS: 1596 {cells}/uL (ref 850–3900)
Lymphocytes Relative: 19 %
MCH: 32 pg (ref 27.0–33.0)
MCHC: 33.2 g/dL (ref 32.0–36.0)
MCV: 96.4 fL (ref 80.0–100.0)
MONO ABS: 756 {cells}/uL (ref 200–950)
MPV: 9.4 fL (ref 7.5–12.5)
Monocytes Relative: 9 %
NEUTROS PCT: 71 %
Neutro Abs: 5964 cells/uL (ref 1500–7800)
Platelets: 255 10*3/uL (ref 140–400)
RBC: 3.87 MIL/uL (ref 3.80–5.10)
RDW: 12.5 % (ref 11.0–15.0)
WBC: 8.4 10*3/uL (ref 3.8–10.8)

## 2016-04-24 LAB — COMPREHENSIVE METABOLIC PANEL
ALBUMIN: 3.9 g/dL (ref 3.6–5.1)
ALT: 9 U/L (ref 6–29)
AST: 13 U/L (ref 10–35)
Alkaline Phosphatase: 75 U/L (ref 33–130)
BUN: 20 mg/dL (ref 7–25)
CHLORIDE: 104 mmol/L (ref 98–110)
CO2: 22 mmol/L (ref 20–31)
CREATININE: 0.95 mg/dL — AB (ref 0.60–0.93)
Calcium: 9.2 mg/dL (ref 8.6–10.4)
Glucose, Bld: 123 mg/dL — ABNORMAL HIGH (ref 65–99)
POTASSIUM: 4.6 mmol/L (ref 3.5–5.3)
SODIUM: 136 mmol/L (ref 135–146)
TOTAL PROTEIN: 6.7 g/dL (ref 6.1–8.1)
Total Bilirubin: 0.5 mg/dL (ref 0.2–1.2)

## 2016-04-24 LAB — CK: Total CK: 36 U/L (ref 7–177)

## 2016-04-25 LAB — SEDIMENTATION RATE: SED RATE: 28 mm/h (ref 0–30)

## 2016-04-25 LAB — RHEUMATOID FACTOR: Rhuematoid fact SerPl-aCnc: <14 IU/mL (ref <14)

## 2016-04-25 LAB — ANA: ANA: NEGATIVE

## 2016-04-28 ENCOUNTER — Ambulatory Visit (HOSPITAL_COMMUNITY)
Admission: RE | Admit: 2016-04-28 | Discharge: 2016-04-28 | Disposition: A | Discharge status: Home / Self Care | Payer: Medicare Other | Source: Ambulatory Visit | Attending: Pulmonary Disease | Admitting: Pulmonary Disease

## 2016-04-28 DIAGNOSIS — R0602 Shortness of breath: Secondary | ICD-10-CM | POA: Diagnosis present

## 2016-04-28 LAB — PULMONARY FUNCTION TEST
DL/VA % PRED: 85 %
DL/VA: 3.99 ml/min/mmHg/L
DLCO cor % pred: 62 %
DLCO cor: 14.34 ml/min/mmHg
DLCO unc % pred: 60 %
DLCO unc: 13.87 ml/min/mmHg
FEF 25-75 Post: 1.84 L/sec
FEF 25-75 Pre: 1.09 L/sec
FEF2575-%Change-Post: 69 %
FEF2575-%PRED-PRE: 68 %
FEF2575-%Pred-Post: 116 %
FEV1-%CHANGE-POST: 23 %
FEV1-%PRED-PRE: 59 %
FEV1-%Pred-Post: 73 %
FEV1-POST: 1.48 L
FEV1-PRE: 1.2 L
FEV1FVC-%CHANGE-POST: -3 %
FEV1FVC-%Pred-Pre: 102 %
FEV6-%CHANGE-POST: 27 %
FEV6-%PRED-PRE: 61 %
FEV6-%Pred-Post: 77 %
FEV6-PRE: 1.55 L
FEV6-Post: 1.98 L
FEV6FVC-%PRED-PRE: 105 %
FEV6FVC-%Pred-Post: 105 %
FVC-%CHANGE-POST: 27 %
FVC-%PRED-POST: 74 %
FVC-%Pred-Pre: 58 %
FVC-POST: 1.98 L
FVC-Pre: 1.55 L
POST FEV6/FVC RATIO: 100 %
PRE FEV6/FVC RATIO: 100 %
Post FEV1/FVC ratio: 75 %
Pre FEV1/FVC ratio: 77 %
RV % PRED: 191 %
RV: 4.31 L
TLC % PRED: 129 %
TLC: 6.37 L

## 2016-04-28 MED ORDER — ALBUTEROL SULFATE (2.5 MG/3ML) 0.083% IN NEBU
2.5000 mg | INHALATION_SOLUTION | Freq: Once | RESPIRATORY_TRACT | Status: AC
  Administered 2016-04-28: 2.5 mg via RESPIRATORY_TRACT

## 2016-04-30 NOTE — Telephone Encounter (Signed)
Can lab results be reviewed, pt left voicemail requesting results.

## 2016-05-01 ENCOUNTER — Ambulatory Visit (INDEPENDENT_AMBULATORY_CARE_PROVIDER_SITE_OTHER): Payer: Medicare Other | Admitting: Pulmonary Disease

## 2016-05-01 ENCOUNTER — Encounter: Payer: Self-pay | Admitting: Pulmonary Disease

## 2016-05-01 ENCOUNTER — Telehealth: Payer: Self-pay | Admitting: Pulmonary Disease

## 2016-05-01 ENCOUNTER — Other Ambulatory Visit: Payer: Self-pay | Admitting: Nurse Practitioner

## 2016-05-01 ENCOUNTER — Other Ambulatory Visit: Payer: Self-pay | Admitting: Family Medicine

## 2016-05-01 VITALS — BP 128/88 | HR 64 | Ht 63.0 in | Wt 207.0 lb

## 2016-05-01 DIAGNOSIS — R0602 Shortness of breath: Secondary | ICD-10-CM

## 2016-05-01 DIAGNOSIS — I6522 Occlusion and stenosis of left carotid artery: Secondary | ICD-10-CM | POA: Diagnosis not present

## 2016-05-01 DIAGNOSIS — K219 Gastro-esophageal reflux disease without esophagitis: Secondary | ICD-10-CM | POA: Diagnosis not present

## 2016-05-01 MED ORDER — ALBUTEROL SULFATE HFA 108 (90 BASE) MCG/ACT IN AERS: 2.0000 | INHALATION_SPRAY | RESPIRATORY_TRACT | 5 refills | Status: DC | PRN

## 2016-05-01 MED ORDER — AEROCHAMBER MV MISC: 0 refills | Status: AC

## 2016-05-01 NOTE — Progress Notes (Signed)
Subjective:    Patient ID: [name], female    DOB: 11-Jun-1941, 75 y.o.   MRN: [id number]  HPI She reports no dyspnea at rest or with sleeping. She reports significant dyspnea on exertion starting for at least a couple of years, at least since 2011. At that same time she was diagnosed with congestive heart failure and began on treatment. She reports she has had significant pain in her left arm and bilateral legs that significant limits her physical capabilities. The pain is in her muscles. This pain has been ongoing for 4 weeks. Prior to the pain she was able to bathe and care for herself with ease. She reports prior to her pain she was only able to walk 1 block. She also reports significant "body fatigue". She denies any associated coughing or wheezing. She reports her level of dyspnea doesn't seem to be changing. No chest pain, pressure, or tightness. She denies any orthopnea or PND. No fever, chills, or sweats. She denies any dyspnea as a child or history of asthma. She denies any history of bronchitis or pneumonia. Denies any seasonal allergies or sinus congestion. She reports no joint swelling or erythema.  Review of Systems  No rashes or abnormal bruising. She denies any reflux or dyspepsia. No morning brash water taste. A pertinent 14 point review of systems is negative except as per the history of presenting illness.  Allergies  Allergen Reactions  . Aspirin Other (See Comments)    HX Bleeding Ulcer   . Nsaids     Hx of bleeding ulcers  . Tolmetin Rash    Hx of bleeding ulcers    Current Outpatient Prescriptions on File Prior to Visit  Medication Sig Dispense Refill  . ALPRAZolam (XANAX) 0.25 MG tablet Take 1 tablet (0.25 mg total) by mouth at bedtime. 30 tablet 3  . carvedilol (COREG) 12.5 MG tablet TAKE 1 TABLET TWICE DAILY WITH A MEAL. 60 tablet 9  . citalopram (CELEXA) 20 MG tablet Take 1 tablet (20 mg total) by mouth daily. 90 tablet 1  . furosemide (LASIX) 40 MG tablet  Take 1 tablet (40 mg total) by mouth as needed for fluid or edema. 30 tablet 11  . HYDROcodone-acetaminophen (NORCO/VICODIN) 5-325 MG tablet Take 1 tablet by mouth every 6 (six) hours as needed for moderate pain (back).    [place] Kitchen levothyroxine (SYNTHROID, LEVOTHROID) 75 MCG tablet TAKE 1 TABLET ONCE DAILY BEFORE BREAKFAST. 30 tablet 0  . losartan (COZAAR) 25 MG tablet Take 1 tablet (25 mg total) by mouth daily. 30 tablet 3  . pantoprazole (PROTONIX) 40 MG tablet Take 1 tablet (40 mg total) by mouth daily. 90 tablet 3  . spironolactone (ALDACTONE) 25 MG tablet Take 0.5 tablets (12.5 mg total) by mouth at bedtime. 30 tablet 11   No current facility-administered medications on file prior to visit.     Past Medical History:  Diagnosis Date  . Abdominal pain   . Anxiety    maintained on Xanax tid for years.  . Arthritis    FEET, KNEES, HANDS  . Chronic systolic CHF (congestive heart failure), NYHA class 2 ([place])    EF 25% 2011 MV,  50-55% echo 6/13  . Diabetes mellitus type 2, diet-controlled ([place])   . GERD (gastroesophageal reflux disease)   . Glaucoma    s/p surgery B in 30s.  [place].  . H/O hiatal hernia   . History of glaucoma   . History of small bowel obstruction 2009 AND NOV  2012  . HTN (hypertension)   . Hypothyroidism   . Left ovarian cyst   . Moderate mitral regurgitation   . Moderate tricuspid regurgitation   . Non-healing surgical wound    ABDOMINAL S/P EXPLORATOY LAPAROTOMY 04-18-2011  . Nonischemic cardiomyopathy ([place]) [facility]    EF is 25% per echo February 2012, non ischemic myoview 12/2009.  EF 50% echo 7/12 and plans for ICD cancelled.   . Peptic ulcer disease   . PVC (premature ventricular contraction)   . Shortness of breath   . Spinal stenosis   . Urge urinary incontinence   . Valvular heart disease    Moderate to severe MR, moderate TR, LAE per echo 7/11  . Wears dentures    upper denture-lower partial    Past Surgical History:  Procedure Laterality Date  .  ABDOMINAL HERNIA REPAIR  32 YRS AGO   PERITINITIS  . APPENDECTOMY  1979   RUPTURED  . BENIGN RIGHT BREAST TUMOR REMOVED    . CARDIOVASCULAR STRESS TEST  JULY 2011-  DR HOCHREIN   NO ISCHEMIA  . EXPLORATOMY LAP. / EXTENSIVE LYSIS ADHESIONS/ SMALL BOWEL RESECTION X2 WITH PRIMARY ANASTOMOSIS X2  04-18-2011   SMALL BOWEL OBSTRUCTION  . GLAUCOMA SURGERY  1978  . HERNIA REPAIR    . INCISION AND DRAINAGE OF WOUND N/A 09/13/2012   Procedure: INCISION  AND DEBRIDEMENT WOUND abdominal wall ;  Surgeon: Rolm Bookbinder, MD;  Location: [place];  Service: General;  Laterality: N/A;  coordination with Dr Migdalia Dk   . KNEE ARTHROSCOPY W/ MENISCECTOMY  06-05-2004  . THYROIDECTOMY  1978 GOITOR   AND CERVICAL COLD KNIFE CONE BX  . TONSILLECTOMY  AGE 61  . TRANSTHORACIC ECHOCARDIOGRAM  12-27-2010   EF 45-50%/ MODERATE MV REGURG. / LEFT ATRIUM MILDLY DILATED/ MILD TRICUSPID REGURG.  . TUBAL LIGATION    . VENTRAL HERNIA REPAIR  X4  LAST ONE 20 YRS AGO   ABDOMINAL --  EACH ONE IN DIFFERENT AREA  . VENTRAL HERNIA REPAIR  01/29/2012   Procedure: HERNIA REPAIR VENTRAL ADULT;  Surgeon: Theodoro Kos, DO;  Location: [facility];  Service: Plastics;  Laterality: N/A;  . WOUND DEBRIDEMENT  09/25/2011   Procedure: DEBRIDEMENT CLOSURE/ABDOMINAL WOUND;  Surgeon: Theodoro Kos, DO;  Location: [facility];  Service: Plastics;  Laterality: N/A;  excision of abdominal wound with primary closure    Family History  Problem Relation Age of Onset  . Stroke Brother   . Diabetes Brother   . Tuberculosis Father   . Stroke Mother   . Stroke Sister   . Heart disease Sister   . Emphysema Sister   . Diabetes Daughter     Social History   Social History  . Marital status: Widowed    Spouse name: N/A  . Number of children: 2  . Years of education: N/A   Occupational History  . RETIRED    Social History Main Topics  . Smoking status: Former Smoker    Packs/day: 1.50    Years: 45.00     Types: Cigarettes    Start date: 11/14/1964    Quit date: 09/22/2010  . Smokeless tobacco: Never Used     Comment: quit 2011 or 2012 - Didn't smoke during pregnancies  . Alcohol use No  . Drug use: No  . Sexual activity: Not Currently   Other Topics Concern  . None   Social History Narrative   Marital status:  Widowed as of 2012; not dating.  Children: four children (1 daughter in [place], 1 daughter in [place] estranged; 1 son in [place]; 1 son deceased in 08-26-14); six grandchildren; 5 gg.      Lives: alone with a yorkie.  Son/Harry lives close.        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      Tobacco:  None; quit in 08/26/10.        Alcohol: none      Drugs:none      Exercise:  No formal exercise program.  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  Cannot swim.        ADLs:  Driving; cleans own house; does grocery shopping; pays bills; does not use cane for ambulation.  Drives.        Advanced Directives: no living will; no HCPOA.  DNR/DNI.        [place]:   Originally from [facility]. Has always lived in [place]. She moved to [place], [place]. 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 Has a dog currently. No bird or mold exposure.       Objective:   Physical Exam BP 128/88 (BP Location: Left Arm, Cuff Size: Normal)   Pulse 64   Ht '5\' 3"'  (1.6 m)   Wt 207 lb (93.9 kg)   SpO2 95%   BMI 36.67 kg/m  General:  Awake. Alert. No acute distress. Elderly female.  Integument:  Warm & dry. No rash on exposed skin. No bruising. Lymphatics:  No appreciated cervical or supraclavicular lymphadenoapthy. HEENT:  Moist mucus membranes. No oral ulcers. No scleral injection or icterus. No nasal turbinate swelling. Cardiovascular:  Regular rate and rhythm. No edema.  Pulmonary:  Good aeration & clear to auscultation bilaterally. Symmetric chest wall expansion. No accessory muscle use on room air. Abdomen: Soft. Normal bowel  sounds. Protuberant.  Musculoskeletal:  Normal bulk and tone. Hand grip strength 5/5 bilaterally. No joint deformity or effusion appreciated. Neurological:  CN 2-12 grossly in tact. No meningismus. Moving all 4 extremities equally. Symmetric brachioradialis deep tendon reflexes. Psychiatric:  Mood and affect congruent. Speech normal rhythm, rate & tone.   PFT 04/28/16: FVC 1.55 L (58%) FEV1 1.20 L (59%) FEV1/FVC 0.77 FEF 25-75 1.09 L (68%) positive bronchodilator response TLC 6.37 L (129%) RV 191% DLCO corrected 62% (Hgb 12.4)  IMAGING CXR PA/LAT 08/30/15 (personally reviewed by me): No focal nodule or opacity. No pleural effusion. Heart normal in size & mediastinum normal in contour.  CARDIAC MYOCARDIAL PERFUSION (04/08/16): EF 55%. No ST segment elevation/deviation. Small defect of mild severity in mid anteroseptal location. Low risk study.  TTE (10/13/14): LV with basal hypokinesis and mild LVH. EF 50-55%. LA mildly dilated & RA normal in size. RV normal in size and function. Pulmonary artery systolic pressure 27 mmHg. Mild aortic regurgitation. Aortic root normal in size. Mild mitral regurgitation. Trivial pulmonic regurgitation. Mild tricuspid regurgitation. Trivial posterior pericardial effusion noted.  LABS 04/23/16 CBC: 8.4/12.4/37.3/255 BMP: 136/4.6/104/22/20/0.95/123/9.2 LFT: 3.9/6.7/0.5/75/13/9 CK: 36 ESR: 28 ANA: Negative Rheumatoid Factor:  <14  11/28/11 Rheumatoid Factor:  <10    Assessment & Plan:  75 y.o. female with long-standing history of dyspnea. I reviewed her pulmonary function testing which shows no evidence of obstruction but does show significant air trapping and hyperinflation that could be consistent with underlying emphysema. Given her long-standing history of tobacco use I feel this is most likely the etiology for her dyspnea on exertion. She has no other symptoms that  would suggest a chronic bronchitis or asthmatic phenotype. Her reflux seems to be  well-controlled at this time as well. She is having significant myalgias which could certainly be secondary to a medication but I will defer to her primary care physician on further investigation and management at this time. She is going to contact her PCP further address this. I instructed the patient to contact my office if she had any new breathing problems or questions before point.  1. Dyspnea: Checking CT chest without contrast. Starting albuterol inhaler with spacer to use as needed and prior to exertion. Repeat spirometry with bronchodilator challenge in 6 minute walk test on room air at next appointment. Further testing depending upon CT result. 2. GERD: Well-controlled with Protonix. No changes at this time. 3. Health Maintenance:  S/P Influenza Vaccine September 2017, Prevnar September 2015, & Pneumovax January 2012. 4. Follow-up: Return to clinic in 4 weeks or sooner if needed.  Sonia Baller Ashok Cordia, M.D. [facility] Critical [facility] Pager:  [phone] After 3pm or if no response, call [phone] 10:22 AM 05/01/16

## 2016-05-01 NOTE — Telephone Encounter (Signed)
Spoke with patient and let her know all labs came back normal. Patient is still in pain that is getting worse. Arms and legs are hurting and patient states she is in agony, says it all started after starting Celexa.  Spoke with Dr. Tamala Julian who recommended taking 1/2 of Celexa for 5 days and then stop it all together.  Patient is to follow up with Dr. Tamala Julian in 2 weeks and discuss her condition and alternative treatment. Patient stated she would call and make an appointment for 2 weeks.

## 2016-05-01 NOTE — Telephone Encounter (Signed)
Call --- labs are all normal.  How is her shoulder pain and hip/leg pain?  If still persistent, I would recommend evaluation by an orthopedist or physical therapy.  Which would she prefer?

## 2016-05-01 NOTE — Patient Instructions (Signed)
   Call me if you have any new breathing problems or questions before your next appointment.  I will see you back in 4 weeks or sooner if needed.  We are sending in an albuterol inhaler with a spacer to use before you do any long walking or exert yourself to help with your shortness of breath.  TESTS ORDERED: 1. CT CHEST W/O CONTRAST 2. 6MWT on room air at next appointment 3. Spirometry with bronchodilator challenge at next appointment

## 2016-05-01 NOTE — Telephone Encounter (Signed)
PFT 04/28/16: FVC 1.55 L (88%) FEV1 1.20 L (59%) FEV1/FVC 0.77 FEF 25-75 1.09 L (68%) TLC 6.37 L (129%) RV 191% DLCO corrected 62% (Hgb 12.4)  IMAGING CXR PA/LAT 08/30/15 (personally reviewed by me): No focal nodule or opacity. No pleural effusion. Heart normal in size & mediastinum normal in contour.  CARDIAC MYOCARDIAL PERFUSION (04/08/16): EF 55%. No ST segment elevation/deviation. Small defect of mild severity in mid anteroseptal location. Low risk study.  TTE (10/13/14): LV with basal hypokinesis and mild LVH. EF 50-55%. LA mildly dilated & RA normal in size. RV normal in size and function. Pulmonary artery systolic pressure 27 mmHg. Mild aortic regurgitation. Aortic root normal in size. Mild mitral regurgitation. Trivial pulmonic regurgitation. Mild tricuspid regurgitation. Trivial posterior pericardial effusion noted.  LABS 04/23/16 CBC: 8.4/12.4/37.3/255 BMP: 136/4.6/104/22/20/0.95/123/9.2 LFT: 3.9/6.7/0.5/75/13/9 CK: 36 ESR: 28 ANA: Negative Rheumatoid Factor:  <14  11/28/11 Rheumatoid Factor:  <10

## 2016-05-05 ENCOUNTER — Telehealth: Payer: Self-pay

## 2016-05-05 NOTE — Telephone Encounter (Signed)
PATIENT STATES DR. Tamala Julian KNOWS SHE HAD SIDE EFFECTS FROM TAKING CELEXA 20 MG. SHE WAS ON IT FOR ABOUT 3 MONTHS. DR. Tamala Julian WANTED HER TO SLOWLY COME OFF OF IT WHICH SHE FINALLY TOOK THE LAST 1/2 PILL YESTERDAY (SUNDAY). SHE WANTS TO KNOW HOW LONG IT WILL TAKE FOR HER TO STOP FEELING THE BAD MUSCLE PAIN? SHE CAN HARDLY PICK UP A CUP OF COFFEE. BEST PHONE [phone] (CELL)  [place]

## 2016-05-06 NOTE — Telephone Encounter (Signed)
Checked how long celexa stays in system and called pt to advise it might take another day to completely clear her body. Advised that if the pain persists or worsens to come in sooner than appt to pursue other causes. Pt agrees.

## 2016-05-12 ENCOUNTER — Telehealth: Payer: Self-pay

## 2016-05-12 DIAGNOSIS — F5104 Psychophysiologic insomnia: Secondary | ICD-10-CM | POA: Insufficient documentation

## 2016-05-12 NOTE — Telephone Encounter (Signed)
Pt had a flu shot recently and now has very bad muscle aches in hips and legs feels very hard to move around  She wants to know if this could be related to the flu shot   Bet number [phone]

## 2016-05-13 ENCOUNTER — Telehealth: Payer: Self-pay

## 2016-05-13 NOTE — Telephone Encounter (Signed)
Pt is checking on status of her message from yesterday about leg pain since the flu shot   Best number [phone]

## 2016-05-14 ENCOUNTER — Telehealth: Payer: Self-pay | Admitting: Family Medicine

## 2016-05-14 NOTE — Telephone Encounter (Signed)
Pt has stopped taking the celexa and will taking tylenol for pain per Dr Tamala Julian pt agrees

## 2016-05-16 NOTE — Telephone Encounter (Signed)
C/o muscle weakness and pain since "flu shot" But has had weakness off and on too. She is set up for an appt. 05/21/16 and will see [place]  Then, she will see [place] sooner as needed

## 2016-05-16 NOTE — Telephone Encounter (Signed)
See notes on 11/29 message.

## 2016-05-16 NOTE — Telephone Encounter (Signed)
Pt received instr per Dr Tamala Julian. See 11/29 message.

## 2016-05-21 ENCOUNTER — Ambulatory Visit (INDEPENDENT_AMBULATORY_CARE_PROVIDER_SITE_OTHER): Payer: Medicare Other | Admitting: Family Medicine

## 2016-05-21 ENCOUNTER — Encounter: Payer: Self-pay | Admitting: Family Medicine

## 2016-05-21 ENCOUNTER — Ambulatory Visit: Payer: Medicare Other | Admitting: Family Medicine

## 2016-05-21 ENCOUNTER — Other Ambulatory Visit: Payer: Medicare Other

## 2016-05-21 VITALS — BP 112/80 | HR 84 | Temp 97.6°F | Resp 18 | Ht 63.0 in | Wt 205.2 lb

## 2016-05-21 DIAGNOSIS — M791 Myalgia, unspecified site: Secondary | ICD-10-CM

## 2016-05-21 DIAGNOSIS — F329 Major depressive disorder, single episode, unspecified: Secondary | ICD-10-CM | POA: Diagnosis not present

## 2016-05-21 DIAGNOSIS — F411 Generalized anxiety disorder: Secondary | ICD-10-CM | POA: Diagnosis not present

## 2016-05-21 MED ORDER — PREDNISONE 20 MG PO TABS: ORAL_TABLET | ORAL | 0 refills | Status: DC

## 2016-05-21 NOTE — Patient Instructions (Signed)
     IF you received an x-ray today, you will receive an invoice from [facility]. Please contact [facility] at [phone] with questions or concerns regarding your invoice.   IF you received labwork today, you will receive an invoice from [facility]. Please contact [facility] at [phone] with questions or concerns regarding your invoice.   Our billing staff will not be able to assist you with questions regarding bills from these companies.  You will be contacted with the lab results as soon as they are available. The fastest way to get your results is to activate your My Chart account. Instructions are located on the last page of this paperwork. If you have not heard from us regarding the results in 2 weeks, please contact this office.      

## 2016-05-21 NOTE — Progress Notes (Signed)
Subjective:    Patient ID: Teresa [name], female    DOB: 07-15-1940, 75 y.o.   MRN: [id number]  05/21/2016  Follow-up (antidepressant Celexa, and Protonix questions, discuss muscle pain from flu shot )   HPI This 75 y.o. female presents with neighbor for one month follow-up for myalgias and depression. After visit, pain in arms and legs acutely worsened.  Some days would be better and worst.  Unable to get up this morning. Now has a walker. The only thing different is the flu vaccine.  Wakes up at night with tingling in hands; has tingling in feet.  Unable to elevate arms; cannot elevate shoulders.  Muscles and not shoulders or joints.  Hips hurt.  Has been sitting a lot today.  Must lean forward and slowly lift up.  Once gets upright, will stop hurting until move again.  Has cane now.  Intensifies; there are times cannot get off the bed.  Crying because could not get out of bed.  Hydrocodone does not touch the pain. Has not increased medication at all.  Had severe pain two days ago; slept well last night; feeling some better today.   Stopped antidepressant two weeks ago and now depression has worsened. Neighbor noticed a great benefit with Citalopram; pt's mood greatly lifted with medication; neighbor requesting that patient restart Citalopram now that pt realizes that current symptoms not likely due to Citalopram.     Immunization History  Administered Date(s) Administered  . Influenza,inj,Quad PF,36+ Mos 04/20/2013, 02/26/2016  . Pneumococcal Conjugate-13 03/01/2014  . Pneumococcal-Unspecified 06/16/2010    Review of Systems  Constitutional: Negative for chills, diaphoresis, fatigue and fever.  Eyes: Negative for visual disturbance.  Respiratory: Negative for cough and shortness of breath.   Cardiovascular: Negative for chest pain, palpitations and leg swelling.  Gastrointestinal: Negative for abdominal pain, constipation, diarrhea, nausea and vomiting.  Endocrine: Negative for cold  intolerance, heat intolerance, polydipsia, polyphagia and polyuria.  Musculoskeletal: Positive for back pain, gait problem and myalgias. Negative for arthralgias and joint swelling.  Neurological: Negative for dizziness, tremors, seizures, syncope, facial asymmetry, speech difficulty, weakness, light-headedness, numbness and headaches.  Psychiatric/Behavioral: Positive for dysphoric mood. Negative for self-injury and sleep disturbance. The patient is not nervous/anxious.     Past Medical History:  Diagnosis Date  . Abdominal pain   . Anxiety    maintained on Xanax tid for years.  . Arthritis    FEET, KNEES, HANDS  . Chronic systolic CHF (congestive heart failure), NYHA class 2 ([place])    EF 25% 2011 MV,  50-55% echo 6/13  . Diabetes mellitus type 2, diet-controlled ([place])   . GERD (gastroesophageal reflux disease)   . Glaucoma    s/p surgery B in 30s.  [place].  . H/O hiatal hernia   . History of glaucoma   . History of small bowel obstruction 2009 AND NOV 2012  . HTN (hypertension)   . Hypothyroidism   . Left ovarian cyst   . Moderate mitral regurgitation   . Moderate tricuspid regurgitation   . Non-healing surgical wound    ABDOMINAL S/P EXPLORATOY LAPAROTOMY 04-18-2011  . Nonischemic cardiomyopathy ([place]) [facility]    EF is 25% per echo February 2012, non ischemic myoview 12/2009.  EF 50% echo 7/12 and plans for ICD cancelled.   . Peptic ulcer disease   . PVC (premature ventricular contraction)   . Shortness of breath   . Spinal stenosis   . Urge urinary incontinence   . Valvular heart  disease    Moderate to severe MR, moderate TR, LAE per echo 7/11  . Wears dentures    upper denture-lower partial   Past Surgical History:  Procedure Laterality Date  . ABDOMINAL HERNIA REPAIR  32 YRS AGO   PERITINITIS  . APPENDECTOMY  1979   RUPTURED  . BENIGN RIGHT BREAST TUMOR REMOVED    . CARDIOVASCULAR STRESS TEST  JULY 2011-  DR HOCHREIN   NO ISCHEMIA  . EXPLORATOMY LAP. /  EXTENSIVE LYSIS ADHESIONS/ SMALL BOWEL RESECTION X2 WITH PRIMARY ANASTOMOSIS X2  04-18-2011   SMALL BOWEL OBSTRUCTION  . GLAUCOMA SURGERY  1978  . HERNIA REPAIR    . INCISION AND DRAINAGE OF WOUND N/A 09/13/2012   Procedure: INCISION  AND DEBRIDEMENT WOUND abdominal wall ;  Surgeon: Rolm Bookbinder, MD;  Location: [facility];  Service: General;  Laterality: N/A;  coordination with Dr Migdalia Dk   . KNEE ARTHROSCOPY W/ MENISCECTOMY  06-05-2004  . THYROIDECTOMY  1978 GOITOR   AND CERVICAL COLD KNIFE CONE BX  . TONSILLECTOMY  AGE 32  . TRANSTHORACIC ECHOCARDIOGRAM  12-27-2010   EF 45-50%/ MODERATE MV REGURG. / LEFT ATRIUM MILDLY DILATED/ MILD TRICUSPID REGURG.  . TUBAL LIGATION    . VENTRAL HERNIA REPAIR  X4  LAST ONE 20 YRS AGO   ABDOMINAL --  EACH ONE IN DIFFERENT AREA  . VENTRAL HERNIA REPAIR  01/29/2012   Procedure: HERNIA REPAIR VENTRAL ADULT;  Surgeon: Theodoro Kos, DO;  Location: [facility];  Service: Plastics;  Laterality: N/A;  . WOUND DEBRIDEMENT  09/25/2011   Procedure: DEBRIDEMENT CLOSURE/ABDOMINAL WOUND;  Surgeon: Theodoro Kos, DO;  Location: [facility];  Service: Plastics;  Laterality: N/A;  excision of abdominal wound with primary closure   Allergies  Allergen Reactions  . Aspirin Other (See Comments)    HX Bleeding Ulcer   . Nsaids     Hx of bleeding ulcers  . Tolmetin Rash    Hx of bleeding ulcers    Social History   Social History  . Marital status: Widowed    Spouse name: N/A  . Number of children: 2  . Years of education: N/A   Occupational History  . RETIRED    Social History Main Topics  . Smoking status: Former Smoker    Packs/day: 1.50    Years: 45.00    Types: Cigarettes    Start date: 11/14/1964    Quit date: 09/22/2010  . Smokeless tobacco: Never Used     Comment: quit 09/08/2009 or Sep 09, 2010 - Didn't smoke during pregnancies  . Alcohol use No  . Drug use: No  . Sexual activity: Not Currently   Other Topics Concern  . Not on file     Social History Narrative   Marital status:  Widowed as of September 09, 2010; not dating.        Children: four children (1 daughter in [place], 1 daughter in [place] estranged; 1 son in [place]; 1 son deceased in September 09, 2014); six grandchildren; 5 gg.      Lives: alone with a yorkie.  Son/Harry lives close.        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      Tobacco:  None; quit in Sep 09, 2010.        Alcohol: none      Drugs:none      Exercise:  No formal exercise program.  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  Cannot swim.  ADLs:  Driving; cleans own house; does grocery shopping; pays bills; does not use cane for ambulation.  Drives.        Advanced Directives: no living will; no HCPOA.  DNR/DNI.        [facility]:   Originally from [facility]. Has always lived in [place]. She moved to [place], [place]. 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 Has a dog currently. No bird or mold exposure.    Family History  Problem Relation Age of Onset  . Stroke Brother   . Diabetes Brother   . Tuberculosis Father   . Stroke Mother   . Stroke Sister   . Heart disease Sister   . Emphysema Sister   . Diabetes Daughter        Objective:    BP 112/80 (BP Location: Left Arm, Patient Position: Sitting, Cuff Size: Large)   Pulse 84   Temp 97.6 F (36.4 C) (Oral)   Resp 18   Ht 5\' 3"  (1.6 m)   Wt 205 lb 3.2 oz (93.1 kg)   SpO2 95%   BMI 36.35 kg/m  Physical Exam  Constitutional: She is oriented to person, place, and time. She appears well-developed and well-nourished. No distress.  HENT:  Head: Normocephalic and atraumatic.  Right Ear: External ear normal.  Left Ear: External ear normal.  Nose: Nose normal.  Mouth/Throat: Oropharynx is clear and moist.  Eyes: Conjunctivae and EOM are normal. Pupils are equal, round, and reactive to light.  Neck: Normal range of motion. Neck supple. Carotid bruit is not present. No thyromegaly  present.  Cardiovascular: Normal rate, regular rhythm, normal heart sounds and intact distal pulses.  Exam reveals no gallop and no friction rub.   No murmur heard. Pulmonary/Chest: Effort normal and breath sounds normal. She has no wheezes. She has no rales.  Abdominal: Soft. Bowel sounds are normal. She exhibits no distension and no mass. There is no tenderness. There is no rebound and no guarding.  Musculoskeletal:       Right shoulder: She exhibits decreased range of motion.       Left shoulder: She exhibits decreased range of motion and pain.  Lymphadenopathy:    She has no cervical adenopathy.  Neurological: She is alert and oriented to person, place, and time. No cranial nerve deficit.  Skin: Skin is warm and dry. No rash noted. She is not diaphoretic. No erythema. No pallor.  Psychiatric: She has a normal mood and affect. Her behavior is normal.   Results for orders placed or performed in visit on 05/21/16  CBC with Differential/Platelet  Result Value Ref Range   WBC 8.6 3.4 - 10.8 x10E3/uL   RBC 3.76 (L) 3.77 - 5.28 x10E6/uL   Hemoglobin 11.5 11.1 - 15.9 g/dL   Hematocrit 34.6 34.0 - 46.6 %   MCV 92 79 - 97 fL   MCH 30.6 26.6 - 33.0 pg   MCHC 33.2 31.5 - 35.7 g/dL   RDW 13.0 12.3 - 15.4 %   Platelets 314 150 - 379 x10E3/uL   Neutrophils 64 Not Estab. %   Lymphs 22 Not Estab. %   Monocytes 11 Not Estab. %   Eos 1 Not Estab. %   Basos 1 Not Estab. %   Neutrophils Absolute 5.7 1.4 - 7.0 x10E3/uL   Lymphocytes Absolute 1.9 0.7 - 3.1 x10E3/uL   Monocytes Absolute 1.0 (H) 0.1 - 0.9 x10E3/uL   EOS (ABSOLUTE) 0.1 0.0 - 0.4 x10E3/uL   Basophils  Absolute 0.0 0.0 - 0.2 x10E3/uL   Immature Granulocytes 1 Not Estab. %   Immature Grans (Abs) 0.1 0.0 - 0.1 x10E3/uL  CK  Result Value Ref Range   Total CK 27 24 - 173 U/L  Comprehensive metabolic panel  Result Value Ref Range   Glucose 105 (H) 65 - 99 mg/dL   BUN 18 8 - 27 mg/dL   Creatinine, Ser 0.78 0.57 - 1.00 mg/dL   GFR calc  non Af Amer 75 >59 mL/min/1.73   GFR calc Af Amer 86 >59 mL/min/1.73   BUN/Creatinine Ratio 23 12 - 28   Sodium 139 134 - 144 mmol/L   Potassium 4.5 3.5 - 5.2 mmol/L   Chloride 100 96 - 106 mmol/L   CO2 23 18 - 29 mmol/L   Calcium 9.3 8.7 - 10.3 mg/dL   Total Protein 6.7 6.0 - 8.5 g/dL   Albumin 4.1 3.5 - 4.8 g/dL   Globulin, Total 2.6 1.5 - 4.5 g/dL   Albumin/Globulin Ratio 1.6 1.2 - 2.2   Bilirubin Total 0.3 0.0 - 1.2 mg/dL   Alkaline Phosphatase 101 39 - 117 IU/L   AST 13 0 - 40 IU/L   ALT 10 0 - 32 IU/L   Depression screen [facility] 2/9 05/21/2016 04/23/2016 08/21/2015 02/12/2015 08/30/2014  Decreased Interest 0 0 0 0 0  Down, Depressed, Hopeless 0 0 0 0 0  PHQ - 2 Score 0 0 0 0 0   Fall Risk  05/21/2016 04/23/2016 08/21/2015 02/12/2015 08/30/2014  Falls in the past year? No No No No Yes  Number falls in past yr: - - - - 1  Injury with Fall? - - - - No       Assessment & Plan:   1. Myalgia   2. Generalized anxiety disorder   3. Reactive depression    -persistent and severe; greatly impacting quality of life; repeat labs.  Refer to rheumatology to rule out underlying autoimmune process.  Does not appear to have joint involvement.   -no improvement with holding Citalopram; recommend restarting Citalopram. -rx for Prednisone provided to see if provides relief of myalgias.    Orders Placed This Encounter  Procedures  . CBC with Differential/Platelet  . CK  . Comprehensive metabolic panel  . Ambulatory referral to Rheumatology    Referral Priority:   Routine    Referral Type:   Consultation    Referral Reason:   Specialty Services Required    Requested Specialty:   Rheumatology    Number of Visits Requested:   1   Meds ordered this encounter  Medications  . DISCONTD: predniSONE (DELTASONE) 20 MG tablet    Sig: Take 3 PO QAM x 1 day, 2 PO QAM x 5 days, 1 PO QAM x 5 days    Dispense:  18 tablet    Refill:  0    Return in about 4 weeks (around 06/18/2016) for recheck.   Gertrude Bucks  Elayne Guerin, M.D. [facility] [address] [place], [place]  [address] [phone] phone [phone] fax

## 2016-05-22 LAB — CBC WITH DIFFERENTIAL/PLATELET
BASOS ABS: 0 10*3/uL (ref 0.0–0.2)
Basos: 1 %
EOS (ABSOLUTE): 0.1 10*3/uL (ref 0.0–0.4)
Eos: 1 %
Hematocrit: 34.6 % (ref 34.0–46.6)
Hemoglobin: 11.5 g/dL (ref 11.1–15.9)
IMMATURE GRANULOCYTES: 1 %
Immature Grans (Abs): 0.1 10*3/uL (ref 0.0–0.1)
LYMPHS ABS: 1.9 10*3/uL (ref 0.7–3.1)
Lymphs: 22 %
MCH: 30.6 pg (ref 26.6–33.0)
MCHC: 33.2 g/dL (ref 31.5–35.7)
MCV: 92 fL (ref 79–97)
MONOS ABS: 1 10*3/uL — AB (ref 0.1–0.9)
Monocytes: 11 %
NEUTROS PCT: 64 %
Neutrophils Absolute: 5.7 10*3/uL (ref 1.4–7.0)
PLATELETS: 314 10*3/uL (ref 150–379)
RBC: 3.76 x10E6/uL — AB (ref 3.77–5.28)
RDW: 13 % (ref 12.3–15.4)
WBC: 8.6 10*3/uL (ref 3.4–10.8)

## 2016-05-22 LAB — COMPREHENSIVE METABOLIC PANEL
ALBUMIN: 4.1 g/dL (ref 3.5–4.8)
ALK PHOS: 101 IU/L (ref 39–117)
ALT: 10 IU/L (ref 0–32)
AST: 13 IU/L (ref 0–40)
Albumin/Globulin Ratio: 1.6 (ref 1.2–2.2)
BILIRUBIN TOTAL: 0.3 mg/dL (ref 0.0–1.2)
BUN / CREAT RATIO: 23 (ref 12–28)
BUN: 18 mg/dL (ref 8–27)
CHLORIDE: 100 mmol/L (ref 96–106)
CO2: 23 mmol/L (ref 18–29)
CREATININE: 0.78 mg/dL (ref 0.57–1.00)
Calcium: 9.3 mg/dL (ref 8.7–10.3)
GFR calc Af Amer: 86 mL/min/{1.73_m2} (ref >59)
GFR calc non Af Amer: 75 mL/min/{1.73_m2} (ref >59)
GLUCOSE: 105 mg/dL — AB (ref 65–99)
Globulin, Total: 2.6 g/dL (ref 1.5–4.5)
Potassium: 4.5 mmol/L (ref 3.5–5.2)
SODIUM: 139 mmol/L (ref 134–144)
Total Protein: 6.7 g/dL (ref 6.0–8.5)

## 2016-05-22 LAB — CK: Total CK: 27 U/L (ref 24–173)

## 2016-06-02 ENCOUNTER — Other Ambulatory Visit: Payer: Self-pay | Admitting: Family Medicine

## 2016-06-02 ENCOUNTER — Encounter: Payer: Self-pay | Admitting: Family Medicine

## 2016-06-04 ENCOUNTER — Telehealth: Payer: Self-pay

## 2016-06-04 DIAGNOSIS — M25552 Pain in left hip: Secondary | ICD-10-CM

## 2016-06-04 DIAGNOSIS — M791 Myalgia, unspecified site: Secondary | ICD-10-CM

## 2016-06-04 DIAGNOSIS — M25551 Pain in right hip: Secondary | ICD-10-CM

## 2016-06-04 DIAGNOSIS — M25511 Pain in right shoulder: Secondary | ICD-10-CM

## 2016-06-04 DIAGNOSIS — M25512 Pain in left shoulder: Secondary | ICD-10-CM

## 2016-06-04 NOTE — Telephone Encounter (Signed)
Pt states she was prescribed predizone and took the last of it on Sunday and now all the pains have returned   Best number [phone]

## 2016-06-04 NOTE — Telephone Encounter (Signed)
02/2016 last nl tsh 05/2016 last ov

## 2016-06-05 NOTE — Telephone Encounter (Signed)
Please call patient for further clarification --- did she greatly improve with prednisone therapy?

## 2016-06-05 NOTE — Telephone Encounter (Signed)
Was told to f/u in 4 weeks from 05/21/16. Any more advice until then?

## 2016-06-10 NOTE — Telephone Encounter (Signed)
Pt stated that she was totally better while on the prednisone. The pain has returned since she finished the pred, not quite as bad as before taking it. Her main problem is when she first gets up in the morning, her muscles are really sore trying to get out of bed. After she has been up for a while it eases up some. Please advise of any further treatment before you see her on 1/3.

## 2016-06-13 MED ORDER — PREDNISONE 10 MG PO TABS: 10.0000 mg | ORAL_TABLET | Freq: Every day | ORAL | 0 refills | Status: DC

## 2016-06-13 NOTE — Telephone Encounter (Signed)
Please call patient --- I recommend evaluation by ortho as well as rheumatology; which orthopedist has she seen in the past?  I have also sent in a refill of Prednisone yet at a lower dose.  Referrals ----appears that Dr. Benny Lennert office denied the referral to rheumatology; please refer to a different rheumatology office in town.

## 2016-06-14 NOTE — Telephone Encounter (Signed)
Pt. Advised. Dr Maxie Better ortho and in plan rheumatology would be fine to refer to. Medicare is only insurance and afraid of cost .  The prednisone has helped, has f/u with dr Tamala Julian 06/19/15 too.

## 2016-06-18 ENCOUNTER — Ambulatory Visit (INDEPENDENT_AMBULATORY_CARE_PROVIDER_SITE_OTHER): Payer: Medicare Other | Admitting: Family Medicine

## 2016-06-18 ENCOUNTER — Encounter: Payer: Self-pay | Admitting: Family Medicine

## 2016-06-18 VITALS — BP 132/70 | HR 92 | Temp 98.4°F | Resp 18 | Ht 63.0 in | Wt 203.0 lb

## 2016-06-18 DIAGNOSIS — M25551 Pain in right hip: Secondary | ICD-10-CM | POA: Diagnosis not present

## 2016-06-18 DIAGNOSIS — M25552 Pain in left hip: Secondary | ICD-10-CM

## 2016-06-18 DIAGNOSIS — I10 Essential (primary) hypertension: Secondary | ICD-10-CM

## 2016-06-18 DIAGNOSIS — M25512 Pain in left shoulder: Secondary | ICD-10-CM

## 2016-06-18 DIAGNOSIS — G8929 Other chronic pain: Secondary | ICD-10-CM | POA: Diagnosis not present

## 2016-06-18 DIAGNOSIS — I428 Other cardiomyopathies: Secondary | ICD-10-CM

## 2016-06-18 DIAGNOSIS — F32A Depression, unspecified: Secondary | ICD-10-CM

## 2016-06-18 DIAGNOSIS — M791 Myalgia, unspecified site: Secondary | ICD-10-CM

## 2016-06-18 DIAGNOSIS — F329 Major depressive disorder, single episode, unspecified: Secondary | ICD-10-CM

## 2016-06-18 DIAGNOSIS — F418 Other specified anxiety disorders: Secondary | ICD-10-CM

## 2016-06-18 DIAGNOSIS — M25511 Pain in right shoulder: Secondary | ICD-10-CM

## 2016-06-18 DIAGNOSIS — F419 Anxiety disorder, unspecified: Principal | ICD-10-CM

## 2016-06-18 MED ORDER — PREDNISONE 5 MG PO TABS: 5.0000 mg | ORAL_TABLET | Freq: Every day | ORAL | 0 refills | Status: DC

## 2016-06-18 NOTE — Patient Instructions (Addendum)
Finish Prednisone 10mg  daily and then start 5mg  daily Prednisone.   It is very important to see orthooedist for xrays.    IF you received an x-ray today, you will receive an invoice from [facility]. Please contact [facility] at [phone] with questions or concerns regarding your invoice.   IF you received labwork today, you will receive an invoice from [facility]. Please contact [facility] at [phone] with questions or concerns regarding your invoice.   Our billing staff will not be able to assist you with questions regarding bills from these companies.  You will be contacted with the lab results as soon as they are available. The fastest way to get your results is to activate your My Chart account. Instructions are located on the last page of this paperwork. If you have not heard from [place] regarding the results in 2 weeks, please contact this office.

## 2016-06-18 NOTE — Progress Notes (Signed)
Subjective:    Patient ID: Teresa [name], female    DOB: 02/13/1941, 76 y.o.   MRN: [id number]  06/18/2016  Follow-up (muscle pain)   HPI This 76 y.o. female presents for evaluation of myalgias in arms and legs proximally.  Labs negative at last two visits.  Improved significantly with Prednisone taper; pain resolved completely with prednisone but returned immediately three days afterwards.   Sent in 10mg  Prednisone five days ago; pain 85% improved.  Onset of symptoms suddenly.  [facility] called today to schedule OV.  Has not heard from rheumatology yet.  Can palpate muscles without pain; use of arms causes pain.  Hips hurt with bending or extending hips.  No pain with laying on hips at night.   Really stiff every morning in arms and legs.  After fifteen minutes while taking Prednisone, improves.  Without prednisone, unable to get out of bed due to pain and stiffness.  Will need to delay CT scan of lungs. Started on [employer].    Emotionally does much better with Citalopram.  Has been weaning benzo due to chronic opiate use for DDD lumbar.   Review of Systems  Constitutional: Negative for chills, diaphoresis, fatigue and fever.  Eyes: Negative for visual disturbance.  Respiratory: Negative for cough and shortness of breath.   Cardiovascular: Negative for chest pain, palpitations and leg swelling.  Gastrointestinal: Negative for abdominal pain, constipation, diarrhea, nausea and vomiting.  Endocrine: Negative for cold intolerance, heat intolerance, polydipsia, polyphagia and polyuria.  Musculoskeletal: Positive for arthralgias, back pain, gait problem and myalgias.  Neurological: Negative for dizziness, tremors, seizures, syncope, facial asymmetry, speech difficulty, weakness, light-headedness, numbness and headaches.  Psychiatric/Behavioral: Positive for dysphoric mood.    Past Medical History:  Diagnosis Date  . Abdominal pain   . Anxiety    maintained on Xanax tid for years.  .  Arthritis    FEET, KNEES, HANDS  . Chronic systolic CHF (congestive heart failure), NYHA class 2 ([place])    EF 25% 2011 MV,  50-55% echo 6/13  . Diabetes mellitus type 2, diet-controlled ([place])   . GERD (gastroesophageal reflux disease)   . Glaucoma    s/p surgery B in 30s.  [place].  . H/O hiatal hernia   . History of glaucoma   . History of small bowel obstruction 2009 AND NOV 2012  . HTN (hypertension)   . Hypothyroidism   . Left ovarian cyst   . Moderate mitral regurgitation   . Moderate tricuspid regurgitation   . Non-healing surgical wound    ABDOMINAL S/P EXPLORATOY LAPAROTOMY 04-18-2011  . Nonischemic cardiomyopathy ([place]) DR [facility]    EF is 25% per echo February 2012, non ischemic myoview 12/2009.  EF 50% echo 7/12 and plans for ICD cancelled.   . Peptic ulcer disease   . PVC (premature ventricular contraction)   . Shortness of breath   . Spinal stenosis   . Urge urinary incontinence   . Valvular heart disease    Moderate to severe MR, moderate TR, LAE per echo 7/11  . Wears dentures    upper denture-lower partial   Past Surgical History:  Procedure Laterality Date  . ABDOMINAL HERNIA REPAIR  32 YRS AGO   PERITINITIS  . APPENDECTOMY  1979   RUPTURED  . BENIGN RIGHT BREAST TUMOR REMOVED    . CARDIOVASCULAR STRESS TEST  JULY 2011-  DR HOCHREIN   NO ISCHEMIA  . EXPLORATOMY LAP. / EXTENSIVE LYSIS ADHESIONS/ SMALL BOWEL RESECTION X2 WITH PRIMARY  ANASTOMOSIS X2  04-18-2011   SMALL BOWEL OBSTRUCTION  . GLAUCOMA SURGERY  1978  . HERNIA REPAIR    . INCISION AND DRAINAGE OF WOUND N/A 09/13/2012   Procedure: INCISION  AND DEBRIDEMENT WOUND abdominal wall ;  Surgeon: Rolm Bookbinder, MD;  Location: [place];  Service: General;  Laterality: N/A;  coordination with Dr Migdalia Dk   . KNEE ARTHROSCOPY W/ MENISCECTOMY  06-05-2004  . THYROIDECTOMY  1978 GOITOR   AND CERVICAL COLD KNIFE CONE BX  . TONSILLECTOMY  AGE 19  . TRANSTHORACIC ECHOCARDIOGRAM  12-27-2010   EF 45-50%/ MODERATE MV  REGURG. / LEFT ATRIUM MILDLY DILATED/ MILD TRICUSPID REGURG.  . TUBAL LIGATION    . VENTRAL HERNIA REPAIR  X4  LAST ONE 20 YRS AGO   ABDOMINAL --  EACH ONE IN DIFFERENT AREA  . VENTRAL HERNIA REPAIR  01/29/2012   Procedure: HERNIA REPAIR VENTRAL ADULT;  Surgeon: Theodoro Kos, DO;  Location: [place];  Service: Plastics;  Laterality: N/A;  . WOUND DEBRIDEMENT  09/25/2011   Procedure: DEBRIDEMENT CLOSURE/ABDOMINAL WOUND;  Surgeon: Theodoro Kos, DO;  Location: [place];  Service: Plastics;  Laterality: N/A;  excision of abdominal wound with primary closure   Allergies  Allergen Reactions  . Aspirin Other (See Comments)    HX Bleeding Ulcer   . Nsaids     Hx of bleeding ulcers  . Tolmetin Rash    Hx of bleeding ulcers   Current Outpatient Prescriptions  Medication Sig Dispense Refill  . albuterol (PROVENTIL HFA;VENTOLIN HFA) 108 (90 Base) MCG/ACT inhaler Inhale 2 puffs into the lungs every 4 (four) hours as needed for wheezing or shortness of breath. 1 Inhaler 5  . ALPRAZolam (XANAX) 0.25 MG tablet Take 1 tablet (0.25 mg total) by mouth at bedtime. 30 tablet 3  . carvedilol (COREG) 12.5 MG tablet TAKE 1 TABLET TWICE DAILY WITH A MEAL. 60 tablet 9  . citalopram (CELEXA) 20 MG tablet Take 1 tablet (20 mg total) by mouth daily. 90 tablet 1  . HYDROcodone-acetaminophen (NORCO/VICODIN) 5-325 MG tablet Take 1 tablet by mouth every 6 (six) hours as needed for moderate pain (back).    Marland Kitchen levothyroxine (SYNTHROID, LEVOTHROID) 75 MCG tablet TAKE 1 TABLET ONCE DAILY BEFORE BREAKFAST. 30 tablet 5  . losartan (COZAAR) 25 MG tablet Take 1 tablet (25 mg total) by mouth daily. 30 tablet 3  . pantoprazole (PROTONIX) 40 MG tablet Take 1 tablet (40 mg total) by mouth daily. 90 tablet 3  . predniSONE (DELTASONE) 10 MG tablet Take 1 tablet (10 mg total) by mouth daily with breakfast. (Patient not taking: Reported on 06/24/2016) 10 tablet 0  . Spacer/Aero-Holding Chambers  (AEROCHAMBER MV) inhaler Use as instructed 1 each 0  . spironolactone (ALDACTONE) 25 MG tablet Take 0.5 tablets (12.5 mg total) by mouth at bedtime. 30 tablet 11  . furosemide (LASIX) 40 MG tablet Take 1 tablet (40 mg total) by mouth as needed for fluid or edema. (Patient not taking: Reported on 06/18/2016) 30 tablet 11  . predniSONE (DELTASONE) 5 MG tablet Take 1 tablet (5 mg total) by mouth daily with breakfast. 10 tablet 0   No current facility-administered medications for this visit.    Social History   Social History  . Marital status: Widowed    Spouse name: N/A  . Number of children: 2  . Years of education: N/A   Occupational History  . RETIRED    Social History Main Topics  . Smoking status:  Former Smoker    Packs/day: 1.50    Years: 45.00    Types: Cigarettes    Start date: 11/14/1964    Quit date: 09/22/2010  . Smokeless tobacco: Never Used     Comment: quit 09/07/09 or 09/08/10 - Didn't smoke during pregnancies  . Alcohol use No  . Drug use: No  . Sexual activity: Not Currently   Other Topics Concern  . Not on file   Social History Narrative   Marital status:  Widowed as of 09-08-10; not dating.        Children: four children (1 daughter in [place], 1 daughter in [place] estranged; 1 son in [place]; 1 son deceased in 2014-09-08); six grandchildren; 5 gg.      Lives: alone with a yorkie.  Son/Harry lives close.        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      Tobacco:  None; quit in 08-Sep-2010.        Alcohol: none      Drugs:none      Exercise:  No formal exercise program.  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  Cannot swim.        ADLs:  Driving; cleans own house; does grocery shopping; pays bills; does not use cane for ambulation.  Drives.        Advanced Directives: no living will; no HCPOA.  DNR/DNI.        [facility]:   Originally from [facility]. Has always lived in [place]. She moved to [place], [place]. 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 Has a dog currently. No bird or mold exposure.    Family History  Problem Relation Age of Onset  . Stroke Brother   . Diabetes Brother   . Tuberculosis Father   . Stroke Mother   . Stroke Sister   . Heart disease Sister   . Emphysema Sister   . Diabetes Daughter        Objective:    BP 132/70 (BP Location: Right Arm, Patient Position: Sitting, Cuff Size: Large)   Pulse 92   Temp 98.4 F (36.9 C) (Oral)   Resp 18   Ht 5\' 3"  (1.6 m)   Wt 203 lb (92.1 kg)   SpO2 95%   BMI 35.96 kg/m  Physical Exam  Constitutional: She is oriented to person, place, and time. She appears well-developed and well-nourished. No distress.  HENT:  Head: Normocephalic and atraumatic.  Right Ear: External ear normal.  Left Ear: External ear normal.  Nose: Nose normal.  Mouth/Throat: Oropharynx is clear and moist.  Eyes: Conjunctivae and EOM are normal. Pupils are equal, round, and reactive to light.  Neck: Normal range of motion. Neck supple. Carotid bruit is not present. No thyromegaly present.  Cardiovascular: Normal rate, regular rhythm, normal heart sounds and intact distal pulses.  Exam reveals no gallop and no friction rub.   No murmur heard. Pulmonary/Chest: Effort normal and breath sounds normal. She has no wheezes. She has no rales.  Abdominal: Soft. Bowel sounds are normal. She exhibits no distension and no mass. There is no tenderness. There is no rebound and no guarding.  Musculoskeletal:       Right shoulder: She exhibits decreased range of motion, pain and decreased strength. She exhibits no tenderness and no bony tenderness.       Left shoulder: She exhibits decreased range of motion, pain and decreased strength. She exhibits no  tenderness, no bony tenderness, no spasm and normal pulse.       Right hip: She exhibits normal range of motion, normal strength, no tenderness and no bony tenderness.       Left hip: She exhibits normal range of motion,  normal strength and no bony tenderness.       Cervical back: Normal.       Lumbar back: She exhibits pain. She exhibits normal range of motion, no tenderness and no bony tenderness.  Lymphadenopathy:    She has no cervical adenopathy.  Neurological: She is alert and oriented to person, place, and time. No cranial nerve deficit.  Skin: Skin is warm and dry. No rash noted. She is not diaphoretic. No erythema. No pallor.  Psychiatric: She has a normal mood and affect. Her behavior is normal.   Results for orders placed or performed in visit on 05/21/16  CBC with Differential/Platelet  Result Value Ref Range   WBC 8.6 3.4 - 10.8 x10E3/uL   RBC 3.76 (L) 3.77 - 5.28 x10E6/uL   Hemoglobin 11.5 11.1 - 15.9 g/dL   Hematocrit 34.6 34.0 - 46.6 %   MCV 92 79 - 97 fL   MCH 30.6 26.6 - 33.0 pg   MCHC 33.2 31.5 - 35.7 g/dL   RDW 13.0 12.3 - 15.4 %   Platelets 314 150 - 379 x10E3/uL   Neutrophils 64 Not Estab. %   Lymphs 22 Not Estab. %   Monocytes 11 Not Estab. %   Eos 1 Not Estab. %   Basos 1 Not Estab. %   Neutrophils Absolute 5.7 1.4 - 7.0 x10E3/uL   Lymphocytes Absolute 1.9 0.7 - 3.1 x10E3/uL   Monocytes Absolute 1.0 (H) 0.1 - 0.9 x10E3/uL   EOS (ABSOLUTE) 0.1 0.0 - 0.4 x10E3/uL   Basophils Absolute 0.0 0.0 - 0.2 x10E3/uL   Immature Granulocytes 1 Not Estab. %   Immature Grans (Abs) 0.1 0.0 - 0.1 x10E3/uL  CK  Result Value Ref Range   Total CK 27 24 - 173 U/L  Comprehensive metabolic panel  Result Value Ref Range   Glucose 105 (H) 65 - 99 mg/dL   BUN 18 8 - 27 mg/dL   Creatinine, Ser 0.78 0.57 - 1.00 mg/dL   GFR calc non Af Amer 75 >59 mL/min/1.73   GFR calc Af Amer 86 >59 mL/min/1.73   BUN/Creatinine Ratio 23 12 - 28   Sodium 139 134 - 144 mmol/L   Potassium 4.5 3.5 - 5.2 mmol/L   Chloride 100 96 - 106 mmol/L   CO2 23 18 - 29 mmol/L   Calcium 9.3 8.7 - 10.3 mg/dL   Total Protein 6.7 6.0 - 8.5 g/dL   Albumin 4.1 3.5 - 4.8 g/dL   Globulin, Total 2.6 1.5 - 4.5 g/dL    Albumin/Globulin Ratio 1.6 1.2 - 2.2   Bilirubin Total 0.3 0.0 - 1.2 mg/dL   Alkaline Phosphatase 101 39 - 117 IU/L   AST 13 0 - 40 IU/L   ALT 10 0 - 32 IU/L       Assessment & Plan:   1. Anxiety and depression   2. Myalgia   3. Nonischemic cardiomyopathy ([place])   4. Essential (primary) hypertension   5. Bilateral hip pain   6. Chronic pain of both shoulders    -myalgias responded nicely to prednisone yet symptoms recurred with cessation of prednisone.  Advised pt to complete Prednisone 10mg  daily and then start 5mg  Prednisone daily; has upcoming appointment with orthopedics.  I highly suspect an underlying rheumatologic process despite negative labs recently; I will follow-up on rheumatology consultation. -anxiety and depression respond nicely to Citalopram so will continue.   No orders of the defined types were placed in this encounter.  Meds ordered this encounter  Medications  . predniSONE (DELTASONE) 5 MG tablet    Sig: Take 1 tablet (5 mg total) by mouth daily with breakfast.    Dispense:  10 tablet    Refill:  0    No Follow-up on file.   Kristi Elayne Guerin, M.D. [facility] [address] [place], [place]  [phone] phone [phone] fax

## 2016-06-24 ENCOUNTER — Ambulatory Visit (INDEPENDENT_AMBULATORY_CARE_PROVIDER_SITE_OTHER): Payer: Medicare Other | Admitting: Orthopaedic Surgery

## 2016-06-24 ENCOUNTER — Encounter (INDEPENDENT_AMBULATORY_CARE_PROVIDER_SITE_OTHER): Payer: Self-pay | Admitting: Orthopaedic Surgery

## 2016-06-24 ENCOUNTER — Ambulatory Visit (INDEPENDENT_AMBULATORY_CARE_PROVIDER_SITE_OTHER): Payer: Medicare Other

## 2016-06-24 DIAGNOSIS — M25552 Pain in left hip: Secondary | ICD-10-CM

## 2016-06-24 DIAGNOSIS — M25551 Pain in right hip: Secondary | ICD-10-CM

## 2016-06-24 DIAGNOSIS — G8929 Other chronic pain: Secondary | ICD-10-CM

## 2016-06-24 DIAGNOSIS — M25511 Pain in right shoulder: Secondary | ICD-10-CM | POA: Diagnosis not present

## 2016-06-24 DIAGNOSIS — M25512 Pain in left shoulder: Secondary | ICD-10-CM | POA: Diagnosis not present

## 2016-06-24 NOTE — Progress Notes (Signed)
Office Visit Note   Patient: Teresa [name]           Date of Birth: 12-May-1941           MRN: [id number] Visit Date: 06/24/2016              Requested by: Wardell Honour, MD [address] [place], [place] [address] PCP: Reginia Forts, MD   Assessment & Plan: Visit Diagnoses:  1. Bilateral hip pain   2. Chronic pain of both shoulders     Plan: Impression is inflammatory arthritis versus vasculitis versus lupus. I discussed with her that I do not see an orthopedic issue but I do suspect some sort of rheumatologic issue. She has an appointment with Dr. Amil Amen. I recommend that she keep this appointment so that she can have a diagnosis. Follow-up with me as needed.  Follow-Up Instructions: Return if symptoms worsen or fail to improve.   Orders:  Orders Placed This Encounter  Procedures  . XR Pelvis 1-2 Views   No orders of the defined types were placed in this encounter.     Procedures: No procedures performed   Clinical Data: No additional findings.   Subjective: Chief Complaint  Patient presents with  . Right Hip - Follow-up  . Left Hip - Follow-up  . Right Elbow - Follow-up  . Left Elbow - Follow-up    Patient is a 76 year old female with chronic bilateral shoulder and hip pain that started in the last few months. She has had multiple rounds of prednisone which has made her pain better but her pain returns when she is off of the prednisone. She states that she has pain in both of her shoulder girdles and bilateral hips but her shoulders are much worse than that her hips. She denies any injuries. She has had preliminary rheumatologic workup by the urgent care which was negative. She comes in today for further evaluation and treatment    Review of Systems Complete review of systems is negative except for history of present illness  Objective: Vital Signs: There were no vitals taken for this visit.  Physical Exam Well-developed well-nourished no acute distress  alert and 3 nonlabored breathing normal judgments affect abdomen soft no lymphadenopathy Ortho Exam Exam of the shoulders and hips are essentially benign. I'm not able to reproduce or palpate re-create any sort of pain with any of my exam maneuvers. Specialty Comments:  No specialty comments available.  Imaging: Xr Pelvis 1-2 Views  Result Date: 06/24/2016 Soft tissue shadow and gas obstructs the hip joints but no obvious or significant degenerative joint disease of the hips.    PMFS History: Patient Active Problem List   Diagnosis Date Noted  . Bilateral hip pain 06/24/2016  . Chronic pain of both shoulders 06/24/2016  . Psychophysiological insomnia 05/12/2016  . Spinal stenosis   . History of glaucoma   . History of small bowel obstruction   . Peptic ulcer disease   . Chronic systolic CHF (congestive heart failure), NYHA class 2 ([place])   . Moderate mitral regurgitation   . Moderate tricuspid regurgitation   . H/O hiatal hernia   . Urge urinary incontinence   . Left ovarian cyst   . Diabetes mellitus type 2, diet-controlled ([place])   . Shortness of breath   . Wears dentures   . Hypothyroidism   . Generalized anxiety disorder 09/15/2013  . Hernia, incisional 05/10/2012  . Abdominal wall hernia 02/10/2012  . Dyslipidemia 05/01/2011  . GERD (gastroesophageal reflux  disease) 05/01/2011  . Osteoarthritis 05/01/2011  . Chronic renal insufficiency 05/01/2011  . HLD (hyperlipidemia) 05/01/2011  . SBO (small bowel obstruction), s/p expl lap 04/24/2011  . Nonischemic cardiomyopathy ([place])   . CCF (congestive cardiac failure) ([place]) 07/12/2010  . Arthritis of ankle or foot, degenerative 07/12/2010  . Arthritis of hand, degenerative 07/12/2010  . Malaise and fatigue 07/12/2010  . Essential (primary) hypertension 07/12/2010  . Glaucoma 07/12/2010   Past Medical History:  Diagnosis Date  . Abdominal pain   . Anxiety    maintained on Xanax tid for years.  . Arthritis    FEET,  KNEES, HANDS  . Chronic systolic CHF (congestive heart failure), NYHA class 2 ([place])    EF 25% 2011 MV,  50-55% echo 6/13  . Diabetes mellitus type 2, diet-controlled ([place])   . GERD (gastroesophageal reflux disease)   . Glaucoma    s/p surgery B in 30s.  [place].  . H/O hiatal hernia   . History of glaucoma   . History of small bowel obstruction 2009 AND NOV 2012  . HTN (hypertension)   . Hypothyroidism   . Left ovarian cyst   . Moderate mitral regurgitation   . Moderate tricuspid regurgitation   . Non-healing surgical wound    ABDOMINAL S/P EXPLORATOY LAPAROTOMY 04-18-2011  . Nonischemic cardiomyopathy ([place]) DR [facility]    EF is 25% per echo February 2012, non ischemic myoview 12/2009.  EF 50% echo 7/12 and plans for ICD cancelled.   . Peptic ulcer disease   . PVC (premature ventricular contraction)   . Shortness of breath   . Spinal stenosis   . Urge urinary incontinence   . Valvular heart disease    Moderate to severe MR, moderate TR, LAE per echo 7/11  . Wears dentures    upper denture-lower partial    Family History  Problem Relation Age of Onset  . Stroke Brother   . Diabetes Brother   . Tuberculosis Father   . Stroke Mother   . Stroke Sister   . Heart disease Sister   . Emphysema Sister   . Diabetes Daughter     Past Surgical History:  Procedure Laterality Date  . ABDOMINAL HERNIA REPAIR  32 YRS AGO   PERITINITIS  . APPENDECTOMY  1979   RUPTURED  . BENIGN RIGHT BREAST TUMOR REMOVED    . CARDIOVASCULAR STRESS TEST  JULY 2011-  DR HOCHREIN   NO ISCHEMIA  . EXPLORATOMY LAP. / EXTENSIVE LYSIS ADHESIONS/ SMALL BOWEL RESECTION X2 WITH PRIMARY ANASTOMOSIS X2  04-18-2011   SMALL BOWEL OBSTRUCTION  . GLAUCOMA SURGERY  1978  . HERNIA REPAIR    . INCISION AND DRAINAGE OF WOUND N/A 09/13/2012   Procedure: INCISION  AND DEBRIDEMENT WOUND abdominal wall ;  Surgeon: Rolm Bookbinder, MD;  Location: [facility] [place];  Service: General;  Laterality: N/A;  coordination with Dr Migdalia Dk   .  KNEE ARTHROSCOPY W/ MENISCECTOMY  06-05-2004  . THYROIDECTOMY  1978 GOITOR   AND CERVICAL COLD KNIFE CONE BX  . TONSILLECTOMY  AGE 16  . TRANSTHORACIC ECHOCARDIOGRAM  12-27-2010   EF 45-50%/ MODERATE MV REGURG. / LEFT ATRIUM MILDLY DILATED/ MILD TRICUSPID REGURG.  . TUBAL LIGATION    . VENTRAL HERNIA REPAIR  X4  LAST ONE 20 YRS AGO   ABDOMINAL --  EACH ONE IN DIFFERENT AREA  . VENTRAL HERNIA REPAIR  01/29/2012   Procedure: HERNIA REPAIR VENTRAL ADULT;  Surgeon: Theodoro Kos, DO;  Location: [place];  Service: Clinical cytogeneticist;  Laterality: N/A;  . WOUND DEBRIDEMENT  09/25/2011   Procedure: DEBRIDEMENT CLOSURE/ABDOMINAL WOUND;  Surgeon: Theodoro Kos, DO;  Location: [place] [facility];  Service: Plastics;  Laterality: N/A;  excision of abdominal wound with primary closure   Social History   Occupational History  . RETIRED    Social History Main Topics  . Smoking status: Former Smoker    Packs/day: 1.50    Years: 45.00    Types: Cigarettes    Start date: 11/14/1964    Quit date: 09/22/2010  . Smokeless tobacco: Never Used     Comment: quit 2011 or 2012 - Didn't smoke during pregnancies  . Alcohol use No  . Drug use: No  . Sexual activity: Not Currently

## 2016-06-26 ENCOUNTER — Ambulatory Visit: Payer: Medicare Other

## 2016-06-26 ENCOUNTER — Ambulatory Visit: Payer: Medicare Other | Admitting: Pulmonary Disease

## 2016-08-15 ENCOUNTER — Other Ambulatory Visit: Payer: Self-pay | Admitting: Family Medicine

## 2016-08-16 NOTE — Telephone Encounter (Signed)
04/2017 LAST REFILL

## 2016-08-18 ENCOUNTER — Other Ambulatory Visit: Payer: Self-pay | Admitting: Family Medicine

## 2016-08-18 NOTE — Telephone Encounter (Signed)
Call --- refill of Alprazolam denied.  As discussed at recent visits, current CDC guidelines state that you can no longer take Alprazolam if you are maintained on daily pain medication.  The Citalopram has replaced the Alprazolam for anxiety and depression.

## 2016-08-19 ENCOUNTER — Ambulatory Visit (INDEPENDENT_AMBULATORY_CARE_PROVIDER_SITE_OTHER): Payer: Medicare Other

## 2016-08-19 ENCOUNTER — Telehealth: Payer: Self-pay | Admitting: *Deleted

## 2016-08-19 ENCOUNTER — Ambulatory Visit (INDEPENDENT_AMBULATORY_CARE_PROVIDER_SITE_OTHER): Payer: Medicare Other | Admitting: Physician Assistant

## 2016-08-19 VITALS — BP 128/74 | HR 73 | Temp 98.2°F | Resp 18 | Ht 63.0 in | Wt 208.0 lb

## 2016-08-19 DIAGNOSIS — Z8639 Personal history of other endocrine, nutritional and metabolic disease: Secondary | ICD-10-CM

## 2016-08-19 DIAGNOSIS — S90121A Contusion of right lesser toe(s) without damage to nail, initial encounter: Secondary | ICD-10-CM | POA: Diagnosis not present

## 2016-08-19 LAB — POCT GLYCOSYLATED HEMOGLOBIN (HGB A1C): Hemoglobin A1C: 6

## 2016-08-19 NOTE — Progress Notes (Deleted)
  Tuberculosis Risk Questionnaire  1. {EXAM; YES/NO:19492::"No"} Were you born outside the [place] in one of the following parts of the world: [place], [place], [place], [place] or [place]?    2. {EXAM; YES/NO:19492::"No"} Have you traveled outside the [place] and lived for more than one month in one of the following parts of the world: [place], [place], [place], [place] or [place]?    3. {EXAM; YES/NO:19492::"No"} Do you have a compromised immune system such as from any of the following conditions:HIV/AIDS, organ or bone marrow transplantation, diabetes, immunosuppressive medicines (e.g. Prednisone, Remicaide), leukemia, lymphoma, cancer of the head or neck, gastrectomy or jejunal bypass, end-stage renal disease (on dialysis), or silicosis?     4. {EXAM; YES/NO:19492::"No"} Have you ever or do you plan on working in: a residential care center, a health care facility, a jail or prison or homeless shelter?    5. {EXAM; YES/NO:19492::"No"} Have you ever: injected illegal drugs, used crack cocaine, lived in a homeless shelter  or been in jail or prison?     6. {EXAM; YES/NO:19492::"No"} Have you ever been exposed to anyone with infectious tuberculosis?    Tuberculosis Symptom Questionnaire  Do you currently have any of the following symptoms?  1. {EXAM; YES/NO:19492::"No"} Unexplained cough lasting more than 3 weeks?   2. {EXAM; YES/NO:19492::"No"} Unexplained fever lasting more than 3 weeks.   3. {EXAM; YES/NO:19492::"No"} Night Sweats (sweating that leaves the bedclothes and sheets wet)     4. {EXAM; YES/NO:19492::"No"} Shortness of Breath   5. {EXAM; YES/NO:19492::"No"} Chest Pain   6. {EXAM; YES/NO:19492::"No"} Unintentional weight loss    7. {EXAM; YES/NO:19492::"No"} Unexplained fatigue (very tired for no reason)  Subjective:     Patient ID: Teresa [name], female   DOB: 08-Aug-1940, 76 y.o.   MRN: [id number]  HPI   Review of Systems      Objective:   Physical Exam     Assessment:     ***    Plan:     ***

## 2016-08-19 NOTE — Telephone Encounter (Signed)
Patient has an appointment 09/26/16 needs a refills of Alprazolam 0.25mg 

## 2016-08-19 NOTE — Patient Instructions (Signed)
     IF you received an x-ray today, you will receive an invoice from [facility]. Please contact [facility] at [phone] with questions or concerns regarding your invoice.   IF you received labwork today, you will receive an invoice from [facility]. Please contact [facility] at [phone] with questions or concerns regarding your invoice.   Our billing staff will not be able to assist you with questions regarding bills from these companies.  You will be contacted with the lab results as soon as they are available. The fastest way to get your results is to activate your My Chart account. Instructions are located on the last page of this paperwork. If you have not heard from us regarding the results in 2 weeks, please contact this office.     

## 2016-08-19 NOTE — Telephone Encounter (Signed)
Duplicate request; responded to 08/15/16 message.  Refill denied.  Per current CDC guidelines, should not take Xanax while taking daily pain medication.  We have been weaning patient's Xanax over the past six months.  Refill is not appropriate.

## 2016-08-19 NOTE — Progress Notes (Signed)
[facility] [address], [place] [address] [phone]  Date:  08/19/2016   Name:  Teresa [name]   DOB:  03-08-1941   MRN:  [id number]  PCP:  Reginia Forts, MD    History of Present Illness:  Teresa [name] is a 76 y.o. female patient who presents to [facility] for bruising of her right toe. Patient reports right toe redness and change of color to her middle toe.  She has noticed possible soreness to the foot.  Reports that she has no trauma though she may have stubbed it unknowing.  She has no fever.  No numbness or tingling.    Patient was placed on long taper of prednisone. No other rashing or bruising.  Patient Active Problem List   Diagnosis Date Noted  . Bilateral hip pain 06/24/2016  . Chronic pain of both shoulders 06/24/2016  . Psychophysiological insomnia 05/12/2016  . Spinal stenosis   . History of glaucoma   . History of small bowel obstruction   . Peptic ulcer disease   . Chronic systolic CHF (congestive heart failure), NYHA class 2 ([place])   . Moderate mitral regurgitation   . Moderate tricuspid regurgitation   . H/O hiatal hernia   . Urge urinary incontinence   . Left ovarian cyst   . Diabetes mellitus type 2, diet-controlled ([place])   . Shortness of breath   . Wears dentures   . Hypothyroidism   . Generalized anxiety disorder 09/15/2013  . Hernia, incisional 05/10/2012  . Abdominal wall hernia 02/10/2012  . Dyslipidemia 05/01/2011  . GERD (gastroesophageal reflux disease) 05/01/2011  . Osteoarthritis 05/01/2011  . Chronic renal insufficiency 05/01/2011  . HLD (hyperlipidemia) 05/01/2011  . SBO (small bowel obstruction), s/p expl lap 04/24/2011  . Nonischemic cardiomyopathy ([place])   . CCF (congestive cardiac failure) ([place]) 07/12/2010  . Arthritis of ankle or foot, degenerative 07/12/2010  . Arthritis of hand, degenerative 07/12/2010  . Malaise and fatigue 07/12/2010  . Essential (primary) hypertension 07/12/2010  . Glaucoma 07/12/2010     Past Medical History:  Diagnosis Date  . Abdominal pain   . Anxiety    maintained on Xanax tid for years.  . Arthritis    FEET, KNEES, HANDS  . Chronic systolic CHF (congestive heart failure), NYHA class 2 ([place])    EF 25% 2011 MV,  50-55% echo 6/13  . Diabetes mellitus type 2, diet-controlled ([place])   . GERD (gastroesophageal reflux disease)   . Glaucoma    s/p surgery B in 30s.  [place].  . H/O hiatal hernia   . History of glaucoma   . History of small bowel obstruction 2009 AND NOV 2012  . HTN (hypertension)   . Hypothyroidism   . Left ovarian cyst   . Moderate mitral regurgitation   . Moderate tricuspid regurgitation   . Non-healing surgical wound    ABDOMINAL S/P EXPLORATOY LAPAROTOMY 04-18-2011  . Nonischemic cardiomyopathy ([place]) DR [facility]    EF is 25% per echo February 2012, non ischemic myoview 12/2009.  EF 50% echo 7/12 and plans for ICD cancelled.   . Peptic ulcer disease   . PVC (premature ventricular contraction)   . Shortness of breath   . Spinal stenosis   . Urge urinary incontinence   . Valvular heart disease    Moderate to severe MR, moderate TR, LAE per echo 7/11  . Wears dentures    upper denture-lower partial    Past Surgical History:  Procedure Laterality Date  .  ABDOMINAL HERNIA REPAIR  32 YRS AGO   PERITINITIS  . APPENDECTOMY  1979   RUPTURED  . BENIGN RIGHT BREAST TUMOR REMOVED    . CARDIOVASCULAR STRESS TEST  JULY 2011-  DR HOCHREIN   NO ISCHEMIA  . EXPLORATOMY LAP. / EXTENSIVE LYSIS ADHESIONS/ SMALL BOWEL RESECTION X2 WITH PRIMARY ANASTOMOSIS X2  04-18-2011   SMALL BOWEL OBSTRUCTION  . GLAUCOMA SURGERY  1978  . HERNIA REPAIR    . INCISION AND DRAINAGE OF WOUND N/A 09/13/2012   Procedure: INCISION  AND DEBRIDEMENT WOUND abdominal wall ;  Surgeon: Rolm Bookbinder, MD;  Location: [place];  Service: General;  Laterality: N/A;  coordination with Dr Migdalia Dk   . KNEE ARTHROSCOPY W/ MENISCECTOMY  06-05-2004  . THYROIDECTOMY  1978 GOITOR   AND  CERVICAL COLD KNIFE CONE BX  . TONSILLECTOMY  AGE 2  . TRANSTHORACIC ECHOCARDIOGRAM  12-27-2010   EF 45-50%/ MODERATE MV REGURG. / LEFT ATRIUM MILDLY DILATED/ MILD TRICUSPID REGURG.  . TUBAL LIGATION    . VENTRAL HERNIA REPAIR  X4  LAST ONE 20 YRS AGO   ABDOMINAL --  EACH ONE IN DIFFERENT AREA  . VENTRAL HERNIA REPAIR  01/29/2012   Procedure: HERNIA REPAIR VENTRAL ADULT;  Surgeon: Theodoro Kos, DO;  Location: [place];  Service: Plastics;  Laterality: N/A;  . WOUND DEBRIDEMENT  09/25/2011   Procedure: DEBRIDEMENT CLOSURE/ABDOMINAL WOUND;  Surgeon: Theodoro Kos, DO;  Location: [place];  Service: Plastics;  Laterality: N/A;  excision of abdominal wound with primary closure    Social History  Substance Use Topics  . Smoking status: Former Smoker    Packs/day: 1.50    Years: 45.00    Types: Cigarettes    Start date: 11/14/1964    Quit date: 09/22/2010  . Smokeless tobacco: Never Used     Comment: quit 2011 or 2012 - Didn't smoke during pregnancies  . Alcohol use No    Family History  Problem Relation Age of Onset  . Stroke Brother   . Diabetes Brother   . Tuberculosis Father   . Stroke Mother   . Stroke Sister   . Heart disease Sister   . Emphysema Sister   . Diabetes Daughter     Allergies  Allergen Reactions  . Aspirin Other (See Comments)    HX Bleeding Ulcer   . Nsaids     Hx of bleeding ulcers  . Tolmetin Rash    Hx of bleeding ulcers    Medication list has been reviewed and updated.  Current Outpatient Prescriptions on File Prior to Visit  Medication Sig Dispense Refill  . albuterol (PROVENTIL HFA;VENTOLIN HFA) 108 (90 Base) MCG/ACT inhaler Inhale 2 puffs into the lungs every 4 (four) hours as needed for wheezing or shortness of breath. 1 Inhaler 5  . ALPRAZolam (XANAX) 0.25 MG tablet Take 1 tablet (0.25 mg total) by mouth at bedtime. 30 tablet 3  . carvedilol (COREG) 12.5 MG tablet TAKE 1 TABLET TWICE DAILY WITH A MEAL. 60  tablet 9  . citalopram (CELEXA) 20 MG tablet Take 1 tablet (20 mg total) by mouth daily. 90 tablet 1  . furosemide (LASIX) 40 MG tablet Take 1 tablet (40 mg total) by mouth as needed for fluid or edema. 30 tablet 11  . HYDROcodone-acetaminophen (NORCO/VICODIN) 5-325 MG tablet Take 1 tablet by mouth every 6 (six) hours as needed for moderate pain (back).    Marland Kitchen levothyroxine (SYNTHROID, LEVOTHROID) 75 MCG tablet TAKE 1 TABLET ONCE  DAILY BEFORE BREAKFAST. 30 tablet 5  . losartan (COZAAR) 25 MG tablet Take 1 tablet (25 mg total) by mouth daily. 30 tablet 3  . pantoprazole (PROTONIX) 40 MG tablet Take 1 tablet (40 mg total) by mouth daily. 90 tablet 3  . predniSONE (DELTASONE) 10 MG tablet Take 1 tablet (10 mg total) by mouth daily with breakfast. 10 tablet 0  . predniSONE (DELTASONE) 5 MG tablet Take 1 tablet (5 mg total) by mouth daily with breakfast. 10 tablet 0  . Spacer/Aero-Holding Chambers (AEROCHAMBER MV) inhaler Use as instructed 1 each 0  . spironolactone (ALDACTONE) 25 MG tablet Take 0.5 tablets (12.5 mg total) by mouth at bedtime. 30 tablet 11   No current facility-administered medications on file prior to visit.     ROS ROS otherwise unremarkable unless listed above.  Physical Examination: BP 128/74 (BP Location: Right Arm, Patient Position: Sitting, Cuff Size: Large)   Pulse 73   Temp 98.2 F (36.8 C) (Oral)   Resp 18   Ht 5\' 3"  (1.6 m)   Wt 208 lb (94.3 kg)   SpO2 95%   BMI 36.85 kg/m  Ideal Body Weight: Weight in (lb) to have BMI = 25: 140.8  Physical Exam  Constitutional: She is oriented to person, place, and time. She appears well-developed and well-nourished. No distress.  HENT:  Head: Normocephalic and atraumatic.  Right Ear: External ear normal.  Left Ear: External ear normal.  Eyes: Conjunctivae and EOM are normal. Pupils are equal, round, and reactive to light.  Cardiovascular: Normal rate.   Pulmonary/Chest: Effort normal. No respiratory distress.   Musculoskeletal:  Normal ROM without tenderness.  Mild ecchymosis at the right middle toe metatarsal.  Normal capillary refill at the distal area of the digit.  Neurological: She is alert and oriented to person, place, and time.  Skin: She is not diaphoretic.  Psychiatric: She has a normal mood and affect. Her behavior is normal.   Dg Toe 3rd Right  Result Date: 08/19/2016 CLINICAL DATA:  Discolored is a shin in the toes for several days without injury, initial encounter EXAM: RIGHT THIRD TOE COMPARISON:  None. FINDINGS: There is no evidence of fracture or dislocation. There is no evidence of arthropathy or other focal bone abnormality. Soft tissues are unremarkable. IMPRESSION: No acute abnormality noted. Electronically Signed   By: Inez Catalina M.D.   On: 08/19/2016 14:35   Results for orders placed or performed in visit on 08/19/16  POCT glycosylated hemoglobin (Hb A1C)  Result Value Ref Range   Hemoglobin A1C 6.0    Assessment and Plan: JOYANNA KLEMAN is a 76 y.o. female who is here today for cc of right toe bruising Possible contusion.  Advised of alarming symptoms such as decreased sensation, increased ecchymotics, fever, etc.--to warrant immediate return. Borderline diabetes, likely not contributory, but discussed diet. Advised icing 1. Contusion of toe of right foot, unspecified toe, initial encounter - DG Toe 3rd Right; Future   Ivar Drape, PA-C [facility] 08/19/2016 2:13 PM

## 2016-09-03 ENCOUNTER — Encounter: Payer: Self-pay | Admitting: Family Medicine

## 2016-09-03 ENCOUNTER — Other Ambulatory Visit: Payer: Self-pay | Admitting: Family Medicine

## 2016-09-04 NOTE — Telephone Encounter (Signed)
Last OV was 06/2016 with PCP, Dr. Tamala Julian. Patient doing well with medical therapy. Refilled for up to 1 year from her last visit.

## 2016-09-07 ENCOUNTER — Encounter: Payer: Self-pay | Admitting: Family Medicine

## 2016-09-16 ENCOUNTER — Encounter: Payer: Self-pay | Admitting: Family Medicine

## 2016-09-16 ENCOUNTER — Ambulatory Visit (INDEPENDENT_AMBULATORY_CARE_PROVIDER_SITE_OTHER): Payer: Medicare Other | Admitting: Family Medicine

## 2016-09-16 VITALS — BP 126/70 | HR 85 | Temp 97.5°F | Resp 18 | Ht 63.0 in | Wt 206.0 lb

## 2016-09-16 DIAGNOSIS — F419 Anxiety disorder, unspecified: Secondary | ICD-10-CM | POA: Diagnosis not present

## 2016-09-16 DIAGNOSIS — M353 Polymyalgia rheumatica: Secondary | ICD-10-CM | POA: Diagnosis not present

## 2016-09-16 DIAGNOSIS — F329 Major depressive disorder, single episode, unspecified: Secondary | ICD-10-CM

## 2016-09-16 DIAGNOSIS — E034 Atrophy of thyroid (acquired): Secondary | ICD-10-CM

## 2016-09-16 DIAGNOSIS — F32A Depression, unspecified: Secondary | ICD-10-CM

## 2016-09-16 DIAGNOSIS — K863 Pseudocyst of pancreas: Secondary | ICD-10-CM | POA: Diagnosis not present

## 2016-09-16 DIAGNOSIS — K432 Incisional hernia without obstruction or gangrene: Secondary | ICD-10-CM

## 2016-09-16 DIAGNOSIS — K591 Functional diarrhea: Secondary | ICD-10-CM | POA: Diagnosis not present

## 2016-09-16 DIAGNOSIS — R7302 Impaired glucose tolerance (oral): Secondary | ICD-10-CM | POA: Diagnosis not present

## 2016-09-16 MED ORDER — ALBUTEROL SULFATE HFA 108 (90 BASE) MCG/ACT IN AERS: 2.0000 | INHALATION_SPRAY | Freq: Four times a day (QID) | RESPIRATORY_TRACT | 1 refills | Status: DC | PRN

## 2016-09-16 NOTE — Progress Notes (Signed)
Subjective:    Patient ID: Teresa [name], female    DOB: Sep 12, 1940, 76 y.o.   MRN: [id number]  09/16/2016  Follow-up (DM check, med check) and Medication Refill (xanax)   HPI This 76 y.o. female presents for three month follow-up of abdominal hernia, myalgias, hypothyroidism, glucose intolerance, anxiety and depression.  Management changes at last visit included refilling Prednisone and referring to rheumatology.  Rheumatology diagnosed with PMR and continued Prednisone therapy.  Feeling much better and able to ambulate well.    Abdominal pain/hernia; having loose stools for one week.  Severe urgency with bowel movement; suffered fecal incontinence. No melena.  Food is passing rapidly through GI tract. Has taken imodium with minimal symptoms.  Having three stools per day.  Having horrible gas pains.  Must eat something to take Prednisone.   Poor quality of life; does not like how looks; does not leave life.   Cannot do much in house.  Given up.  Content with being alone.   Less depressed yet lonely.  Has spent the past several years caring for husband and then son.  Now has no purpose.     Immunization History  Administered Date(s) Administered  . Influenza,inj,Quad PF,36+ Mos 04/20/2013, 02/26/2016  . Pneumococcal Conjugate-13 03/01/2014  . Pneumococcal-Unspecified 06/16/2010   BP Readings from Last 3 Encounters:  09/16/16 126/70  08/19/16 128/74  06/18/16 132/70   Wt Readings from Last 3 Encounters:  09/16/16 206 lb (93.4 kg)  08/19/16 208 lb (94.3 kg)  06/18/16 203 lb (92.1 kg)    Review of Systems  Constitutional: Negative for chills, diaphoresis, fatigue and fever.  Eyes: Negative for visual disturbance.  Respiratory: Negative for cough and shortness of breath.   Cardiovascular: Negative for chest pain, palpitations and leg swelling.  Gastrointestinal: Positive for abdominal distention and diarrhea. Negative for abdominal pain, anal bleeding, blood in stool, constipation,  nausea, rectal pain and vomiting.  Endocrine: Negative for cold intolerance, heat intolerance, polydipsia, polyphagia and polyuria.  Musculoskeletal: Positive for myalgias.  Neurological: Negative for dizziness, tremors, seizures, syncope, facial asymmetry, speech difficulty, weakness, light-headedness, numbness and headaches.  Psychiatric/Behavioral: Positive for dysphoric mood. Negative for self-injury and sleep disturbance. The patient is nervous/anxious.     Past Medical History:  Diagnosis Date  . Abdominal pain   . Anxiety    maintained on Xanax tid for years.  . Arthritis    FEET, KNEES, HANDS  . Chronic systolic CHF (congestive heart failure), NYHA class 2 ([place])    EF 25% 2011 MV,  50-55% echo 6/13  . Diabetes mellitus type 2, diet-controlled ([place])   . GERD (gastroesophageal reflux disease)   . Glaucoma    s/p surgery B in 30s.  [place].  . H/O hiatal hernia   . History of glaucoma   . History of small bowel obstruction 2009 AND NOV 2012  . HTN (hypertension)   . Hypothyroidism   . Left ovarian cyst   . Moderate mitral regurgitation   . Moderate tricuspid regurgitation   . Non-healing surgical wound    ABDOMINAL S/P EXPLORATOY LAPAROTOMY 04-18-2011  . Nonischemic cardiomyopathy ([place]) DR [facility]    EF is 25% per echo February 2012, non ischemic myoview 12/2009.  EF 50% echo 7/12 and plans for ICD cancelled.   . Peptic ulcer disease   . PVC (premature ventricular contraction)   . Shortness of breath   . Spinal stenosis   . Urge urinary incontinence   . Valvular heart disease  Moderate to severe MR, moderate TR, LAE per echo 7/11  . Wears dentures    upper denture-lower partial   Past Surgical History:  Procedure Laterality Date  . ABDOMINAL HERNIA REPAIR  32 YRS AGO   PERITINITIS  . APPENDECTOMY  1979   RUPTURED  . BENIGN RIGHT BREAST TUMOR REMOVED    . CARDIOVASCULAR STRESS TEST  JULY 2011-  DR HOCHREIN   NO ISCHEMIA  . EXPLORATOMY LAP. / EXTENSIVE LYSIS  ADHESIONS/ SMALL BOWEL RESECTION X2 WITH PRIMARY ANASTOMOSIS X2  04-18-2011   SMALL BOWEL OBSTRUCTION  . GLAUCOMA SURGERY  1978  . HERNIA REPAIR    . INCISION AND DRAINAGE OF WOUND N/A 09/13/2012   Procedure: INCISION  AND DEBRIDEMENT WOUND abdominal wall ;  Surgeon: Rolm Bookbinder, MD;  Location: [place];  Service: General;  Laterality: N/A;  coordination with Dr Migdalia Dk   . KNEE ARTHROSCOPY W/ MENISCECTOMY  06-05-2004  . THYROIDECTOMY  1978 GOITOR   AND CERVICAL COLD KNIFE CONE BX  . TONSILLECTOMY  AGE 49  . TRANSTHORACIC ECHOCARDIOGRAM  12-27-2010   EF 45-50%/ MODERATE MV REGURG. / LEFT ATRIUM MILDLY DILATED/ MILD TRICUSPID REGURG.  . TUBAL LIGATION    . VENTRAL HERNIA REPAIR  X4  LAST ONE 20 YRS AGO   ABDOMINAL --  EACH ONE IN DIFFERENT AREA  . VENTRAL HERNIA REPAIR  01/29/2012   Procedure: HERNIA REPAIR VENTRAL ADULT;  Surgeon: Theodoro Kos, DO;  Location: [place];  Service: Plastics;  Laterality: N/A;  . WOUND DEBRIDEMENT  09/25/2011   Procedure: DEBRIDEMENT CLOSURE/ABDOMINAL WOUND;  Surgeon: Theodoro Kos, DO;  Location: [place];  Service: Plastics;  Laterality: N/A;  excision of abdominal wound with primary closure   Allergies  Allergen Reactions  . Aspirin Other (See Comments)    HX Bleeding Ulcer   . Nsaids     Hx of bleeding ulcers  . Tolmetin Rash    Hx of bleeding ulcers    Social History   Social History  . Marital status: Widowed    Spouse name: N/A  . Number of children: 2  . Years of education: N/A   Occupational History  . RETIRED    Social History Main Topics  . Smoking status: Former Smoker    Packs/day: 1.50    Years: 45.00    Types: Cigarettes    Start date: 11/14/1964    Quit date: 09/22/2010  . Smokeless tobacco: Never Used     Comment: quit 09/18/2009 or 19-Sep-2010 - Didn't smoke during pregnancies  . Alcohol use No  . Drug use: No  . Sexual activity: Not Currently   Other Topics Concern  . Not on file   Social  History Narrative   Marital status:  Widowed as of September 19, 2010; not dating.        Children: four children (1 daughter in [place], 1 daughter in [place] estranged; 1 son in [place]; 1 son deceased in 09-19-2014); six grandchildren; 5 gg.      Lives: alone with a yorkie.  Son/Harry lives close.        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      Tobacco:  None; quit in 19-Sep-2010.        Alcohol: none      Drugs:none      Exercise:  No formal exercise program.  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  Cannot swim.        ADLs:  Driving; cleans own house; does grocery shopping; pays bills; does not use cane for ambulation.  Drives.        Advanced Directives: no living will; no HCPOA.  DNR/DNI.        [facility]:   Originally from [place] [facility]. Has always lived in [place]. She moved to [place], [place]. 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 Has a dog currently. No bird or mold exposure.    Family History  Problem Relation Age of Onset  . Stroke Brother   . Diabetes Brother   . Tuberculosis Father   . Stroke Mother   . Stroke Sister   . Heart disease Sister   . Emphysema Sister   . Diabetes Daughter        Objective:    BP 126/70   Pulse 85   Temp 97.5 F (36.4 C) (Oral)   Resp 18   Ht 5\' 3"  (1.6 m)   Wt 206 lb (93.4 kg)   SpO2 96%   BMI 36.49 kg/m  Physical Exam  Constitutional: She is oriented to person, place, and time. She appears well-developed and well-nourished. No distress.  HENT:  Head: Normocephalic and atraumatic.  Right Ear: External ear normal.  Left Ear: External ear normal.  Nose: Nose normal.  Mouth/Throat: Oropharynx is clear and moist.  Eyes: Conjunctivae and EOM are normal. Pupils are equal, round, and reactive to light.  Neck: Normal range of motion. Neck supple. Carotid bruit is not present. No thyromegaly present.  Cardiovascular: Normal rate, regular rhythm, normal heart sounds and intact distal  pulses.  Exam reveals no gallop and no friction rub.   No murmur heard. Pulmonary/Chest: Effort normal and breath sounds normal. She has no wheezes. She has no rales.  Abdominal: Soft. Bowel sounds are normal. She exhibits no distension and no mass. There is no tenderness. There is no rebound and no guarding.  Large hernia R lower abdomen; non-tender.    Lymphadenopathy:    She has no cervical adenopathy.  Neurological: She is alert and oriented to person, place, and time. No cranial nerve deficit. She exhibits normal muscle tone. Coordination normal.  Skin: Skin is warm and dry. No rash noted. She is not diaphoretic. No erythema. No pallor.  Psychiatric: She has a normal mood and affect. Her behavior is normal. Judgment and thought content normal.   Depression screen [facility] 2/9 09/16/2016 08/19/2016 06/18/2016 05/21/2016 04/23/2016  Decreased Interest 0 0 0 0 0  Down, Depressed, Hopeless 0 0 0 0 0  PHQ - 2 Score 0 0 0 0 0   Fall Risk  09/16/2016 08/19/2016 06/18/2016 05/21/2016 04/23/2016  Falls in the past year? No No No No No  Number falls in past yr: - - - - -  Injury with Fall? - - - - -         Assessment & Plan:   1. Polymyalgia rheumatica ([place])   2. Hypothyroidism due to acquired atrophy of thyroid   3. Glucose intolerance (impaired glucose tolerance)   4. Functional diarrhea   5. Pancreatic pseudocyst   6. Incisional hernia, without obstruction or gangrene   7. Anxiety and depression    -newly dx PMR; tolerating Prednisone well; feeling much better. -presenting with abdominal pain and diarrhea for the past week; scheduled for upcoming MRI abdomen to evaluate pancreatic pseudocyst; will evaluate for other causes as well. Obtain labs. BRAT diet, hydration; benign abdominal exam in office.   -continues to suffer with depression  and adjustments to life since death of son; coping well.   -will no longer refill Xanax as patient maintained on chronic opiates per pain management for DDD  lumbar.   Orders Placed This Encounter  Procedures  . MR Abdomen W Wo Contrast    Epic order Labs drawn on 09/16/16 Wt 208/ ht 5'3/no claus/ no needs/ no stents or implants/ no metal in eyes or metal injury/ no shrapnel or bullets in the body/sx to abdomen, ruptured appendix, hernia repair, intestine repair, brain,heart,eyes or ears/no hx of liv or kid dz/ no hx of lupus or ra/ htn with meds no diab/ nkda to iv dye/ mcr/fwp and pt     Standing Status:   Future    Number of Occurrences:   1    Standing Expiration Date:   11/16/2017    Order Specific Question:   If indicated for the ordered procedure, I authorize the administration of contrast media per Radiology protocol    Answer:   Yes    Order Specific Question:   Reason for Exam (SYMPTOM  OR DIAGNOSIS REQUIRED)    Answer:   abd pain    Order Specific Question:   What is the patient's sedation requirement?    Answer:   No Sedation    Order Specific Question:   Does the patient have a pacemaker or implanted devices?    Answer:   No    Order Specific Question:   Preferred imaging location?    Answer:   [facility] (table limit-550lbs)    Order Specific Question:   Radiology Contrast Protocol - do NOT remove file path    Answer:   [email or web address]  . CBC with Differential/Platelet  . Comprehensive metabolic panel  . TSH  . T4, free  . HM Diabetes Foot Exam   Meds ordered this encounter  Medications  . albuterol (PROVENTIL HFA;VENTOLIN HFA) 108 (90 Base) MCG/ACT inhaler    Sig: Inhale 2 puffs into the lungs every 6 (six) hours as needed for wheezing or shortness of breath.    Dispense:  1 Inhaler    Refill:  1    Return in about 3 months (around 12/16/2016) for recheck.   Earlisha Sharples Elayne Guerin, M.D. Primary Care at [facility] previously [facility] [address] [place], [place]  [address] [phone] phone [phone] fax

## 2016-09-16 NOTE — Patient Instructions (Signed)
     IF you received an x-ray today, you will receive an invoice from [facility]. Please contact [facility] at [phone] with questions or concerns regarding your invoice.   IF you received labwork today, you will receive an invoice from [facility]. Please contact [facility] at [phone] with questions or concerns regarding your invoice.   Our billing staff will not be able to assist you with questions regarding bills from these companies.  You will be contacted with the lab results as soon as they are available. The fastest way to get your results is to activate your My Chart account. Instructions are located on the last page of this paperwork. If you have not heard from us regarding the results in 2 weeks, please contact this office.     

## 2016-09-17 LAB — CBC WITH DIFFERENTIAL/PLATELET
BASOS ABS: 0 10*3/uL (ref 0.0–0.2)
Basos: 0 %
EOS (ABSOLUTE): 0 10*3/uL (ref 0.0–0.4)
Eos: 0 %
Hematocrit: 39.3 % (ref 34.0–46.6)
Hemoglobin: 13 g/dL (ref 11.1–15.9)
Immature Grans (Abs): 0.3 10*3/uL — ABNORMAL HIGH (ref 0.0–0.1)
Immature Granulocytes: 3 %
LYMPHS ABS: 1.3 10*3/uL (ref 0.7–3.1)
Lymphs: 14 %
MCH: 31.9 pg (ref 26.6–33.0)
MCHC: 33.1 g/dL (ref 31.5–35.7)
MCV: 97 fL (ref 79–97)
MONOCYTES: 4 %
MONOS ABS: 0.4 10*3/uL (ref 0.1–0.9)
NEUTROS PCT: 79 %
Neutrophils Absolute: 7.5 10*3/uL — ABNORMAL HIGH (ref 1.4–7.0)
Platelets: 221 10*3/uL (ref 150–379)
RBC: 4.07 x10E6/uL (ref 3.77–5.28)
RDW: 14.6 % (ref 12.3–15.4)
WBC: 9.6 10*3/uL (ref 3.4–10.8)

## 2016-09-17 LAB — COMPREHENSIVE METABOLIC PANEL
ALK PHOS: 62 IU/L (ref 39–117)
ALT: 16 IU/L (ref 0–32)
AST: 16 IU/L (ref 0–40)
Albumin/Globulin Ratio: 1.7 (ref 1.2–2.2)
Albumin: 4.2 g/dL (ref 3.5–4.8)
BUN / CREAT RATIO: 22 (ref 12–28)
BUN: 20 mg/dL (ref 8–27)
Bilirubin Total: 0.3 mg/dL (ref 0.0–1.2)
CO2: 19 mmol/L (ref 18–29)
CREATININE: 0.9 mg/dL (ref 0.57–1.00)
Calcium: 9.2 mg/dL (ref 8.7–10.3)
Chloride: 100 mmol/L (ref 96–106)
GFR calc Af Amer: 72 mL/min/{1.73_m2} (ref >59)
GFR, EST NON AFRICAN AMERICAN: 63 mL/min/{1.73_m2} (ref >59)
GLUCOSE: 133 mg/dL — AB (ref 65–99)
Globulin, Total: 2.5 g/dL (ref 1.5–4.5)
Potassium: 4.5 mmol/L (ref 3.5–5.2)
SODIUM: 137 mmol/L (ref 134–144)
Total Protein: 6.7 g/dL (ref 6.0–8.5)

## 2016-09-17 LAB — TSH: TSH: 1.17 u[IU]/mL (ref 0.450–4.500)

## 2016-09-17 LAB — T4, FREE: FREE T4: 1.38 ng/dL (ref 0.82–1.77)

## 2016-09-18 ENCOUNTER — Other Ambulatory Visit: Payer: Self-pay | Admitting: Family Medicine

## 2016-09-19 NOTE — Telephone Encounter (Signed)
04/23/2016 last refill

## 2016-09-24 ENCOUNTER — Telehealth: Payer: Self-pay | Admitting: Family Medicine

## 2016-09-24 ENCOUNTER — Ambulatory Visit: Payer: Medicare Other | Admitting: Nurse Practitioner

## 2016-09-24 DIAGNOSIS — K591 Functional diarrhea: Secondary | ICD-10-CM

## 2016-09-24 NOTE — Telephone Encounter (Signed)
PATIENT STATES SHE SAW DR. Tamala Julian LAST WEEK ON Tuesday AND SHE SAW HER FOR A FEW THINGS. SHE TOLD DR. Tamala Julian THAT SHE HAS BEEN HAVING DIARRHEA FOR 5 WEEKS. DR. Tamala Julian TOLD HER TO TRY IMODIUM AND YOGURT. SHE SAID IT IS HELPING SOME BUT DOES NOT SEEM TO BE STRONG ENOUGH BECAUSE NOW IT JUST COMES OUT ON IT'S OWN. SHE WOULD LIKE TO HAVE SOMETHING CALLED INTO HER PHARMACY SOON. BEST PHONE [phone] (CELL) PHARMACY CHOICE IS [facility].  [place]

## 2016-09-24 NOTE — Telephone Encounter (Signed)
Advised patient se needs to come in for further evaluation of constant diarrhea States, she is unable to come in this week. Denies nausea, vomiting or dizziness Complaining of fatigue,and gas pain She is taking Imodium with each BM- 4 tablets per day. Reports no diarrhea x 6-8 hrs yesterday MRI scheduled for next Tuesday Please advise

## 2016-09-29 NOTE — Telephone Encounter (Signed)
Pt advised to return to the clinic for stool Will be in tomorrow after MRI

## 2016-09-29 NOTE — Telephone Encounter (Signed)
With loose stools/diarrhea worsening, I recommend undergoing stool studies to rule out infectious cause of symptoms.  (Stool cx, C. Difficile, O&P).  I will place future order for these stool studies; pt needs to stop by the office to pick up stool kit for completion.

## 2016-09-30 ENCOUNTER — Ambulatory Visit
Admission: RE | Admit: 2016-09-30 | Discharge: 2016-09-30 | Disposition: A | Discharge status: Home / Self Care | Payer: Medicare Other | Source: Ambulatory Visit | Attending: Family Medicine | Admitting: Family Medicine

## 2016-09-30 DIAGNOSIS — K863 Pseudocyst of pancreas: Secondary | ICD-10-CM

## 2016-09-30 MED ORDER — GADOBENATE DIMEGLUMINE 529 MG/ML IV SOLN
19.0000 mL | Freq: Once | INTRAVENOUS | Status: AC | PRN
  Administered 2016-09-30: 19 mL via INTRAVENOUS

## 2016-10-02 LAB — CLOSTRIDIUM DIFFICILE BY PCR: CDIFFPCR: NEGATIVE

## 2016-10-06 ENCOUNTER — Telehealth: Payer: Self-pay

## 2016-10-06 LAB — STOOL CULTURE: E COLI SHIGA TOXIN ASSAY: NEGATIVE

## 2016-10-06 NOTE — Telephone Encounter (Signed)
Pt continues to have diarrhea, advised of results so far as nl (except elevated glucose) and o&p still pending  She wants MRI results too And advice on what to do for diarrhea,  Doing clears/brat/yogurt/immodium w/o relief\  I advised pushing clears until she hears from [place]

## 2016-10-07 LAB — OVA AND PARASITE EXAMINATION

## 2016-10-07 NOTE — Telephone Encounter (Signed)
Pt is still having loose stools with every BM. States, last night was the first time she was able to sleep through the night.  Taking Imodium and eating yogurt without relief. Denies dizziness, some slight weakness and decrease in appetite.  Also, requesting MRI results from 2 weeks ago. I called over to [place] because MRI did not cross over. Tech will fax results over and she will find out what the delay is.  Please advise the next step

## 2016-10-07 NOTE — Telephone Encounter (Signed)
PT CALLING BACK ABOUT RESULTS ON MRI O&P AND WHAT TO DO ABOUT DIARRHEA

## 2016-10-08 NOTE — Telephone Encounter (Signed)
Great question.  One new thing for her is the daily Prednisone; it may be causing the diarrhea.  I recommend taking Imodium twice daily to three times daily.  I also recommend taking Prednisone with large meal daily.

## 2016-10-08 NOTE — Telephone Encounter (Signed)
MRI results and lab results have all been reviewed.  Please see lab notes and MRI notes from me: Notes recorded by Wardell Honour, MD on 10/06/2016 at 10:17 PM EDT Call ----- MRI of pancreas is stable. The simple cyst in tail of pancreas is unchanged. Repeat MRI recommended in TWO YEARS. How is her diarrhea?           O&P also negative.  Is she taking Protonix daily?  How many Imodium is she taking daily? How many stools is she having daily?

## 2016-10-08 NOTE — Telephone Encounter (Signed)
Called patient and informed her of Dr. Thompson Caul recommendations. Instructed her to contact her rheumatologist or come in for a visit if these symptoms persist despite recommendations.

## 2016-10-08 NOTE — Telephone Encounter (Signed)
Taking Protonix x 1 day  Tried imodium x 1 w/o relief "I wont take any more because it doesn't work"  stooling x3 per day. So it is improving  Pt wonders if the prednisone is causing some diarrhea? She is on daily til November from rheumatology   Thoughts?   I advised to stay hydrated

## 2016-10-09 ENCOUNTER — Other Ambulatory Visit: Payer: Self-pay | Admitting: Nurse Practitioner

## 2016-10-12 DIAGNOSIS — F329 Major depressive disorder, single episode, unspecified: Secondary | ICD-10-CM | POA: Insufficient documentation

## 2016-10-12 DIAGNOSIS — F32A Depression, unspecified: Secondary | ICD-10-CM | POA: Insufficient documentation

## 2016-10-12 DIAGNOSIS — F419 Anxiety disorder, unspecified: Secondary | ICD-10-CM

## 2016-10-12 DIAGNOSIS — M353 Polymyalgia rheumatica: Secondary | ICD-10-CM | POA: Insufficient documentation

## 2016-11-05 ENCOUNTER — Ambulatory Visit: Payer: Medicare Other | Admitting: Nurse Practitioner

## 2016-11-05 NOTE — Progress Notes (Deleted)
CARDIOLOGY OFFICE NOTE  Date:  11/05/2016    Teresa [name] Date of Birth: August 07, 1940 Medical Record #[id number]  PCP:  Wardell Honour, MD  Cardiologist:  Servando Snare & ***    No chief complaint on file.   History of Present Illness: Teresa [name] is a 76 y.o. female who presents today for a ***   Comes in today. Here with   Past Medical History:  Diagnosis Date  . Abdominal pain   . Anxiety    maintained on Xanax tid for years.  . Arthritis    FEET, KNEES, HANDS  . Chronic systolic CHF (congestive heart failure), NYHA class 2 ([place])    EF 25% 2011 MV,  50-55% echo 6/13  . Diabetes mellitus type 2, diet-controlled ([place])   . GERD (gastroesophageal reflux disease)   . Glaucoma    s/p surgery B in 30s.  [place].  . H/O hiatal hernia   . History of glaucoma   . History of small bowel obstruction 2009 AND NOV 2012  . HTN (hypertension)   . Hypothyroidism   . Left ovarian cyst   . Moderate mitral regurgitation   . Moderate tricuspid regurgitation   . Non-healing surgical wound    ABDOMINAL S/P EXPLORATOY LAPAROTOMY 04-18-2011  . Nonischemic cardiomyopathy ([place]) [facility]    EF is 25% per echo February 2012, non ischemic myoview 12/2009.  EF 50% echo 7/12 and plans for ICD cancelled.   . Peptic ulcer disease   . PVC (premature ventricular contraction)   . Shortness of breath   . Spinal stenosis   . Urge urinary incontinence   . Valvular heart disease    Moderate to severe MR, moderate TR, LAE per echo 7/11  . Wears dentures    upper denture-lower partial    Past Surgical History:  Procedure Laterality Date  . ABDOMINAL HERNIA REPAIR  32 YRS AGO   PERITINITIS  . APPENDECTOMY  1979   RUPTURED  . BENIGN RIGHT BREAST TUMOR REMOVED    . CARDIOVASCULAR STRESS TEST  JULY 2011-  DR HOCHREIN   NO ISCHEMIA  . EXPLORATOMY LAP. / EXTENSIVE LYSIS ADHESIONS/ SMALL BOWEL RESECTION X2 WITH PRIMARY ANASTOMOSIS X2  04-18-2011   SMALL BOWEL OBSTRUCTION  . GLAUCOMA SURGERY   1978  . HERNIA REPAIR    . INCISION AND DRAINAGE OF WOUND N/A 09/13/2012   Procedure: INCISION  AND DEBRIDEMENT WOUND abdominal wall ;  Surgeon: Rolm Bookbinder, MD;  Location: [place];  Service: General;  Laterality: N/A;  coordination with Dr Migdalia Dk   . KNEE ARTHROSCOPY W/ MENISCECTOMY  06-05-2004  . THYROIDECTOMY  1978 GOITOR   AND CERVICAL COLD KNIFE CONE BX  . TONSILLECTOMY  AGE 48  . TRANSTHORACIC ECHOCARDIOGRAM  12-27-2010   EF 45-50%/ MODERATE MV REGURG. / LEFT ATRIUM MILDLY DILATED/ MILD TRICUSPID REGURG.  . TUBAL LIGATION    . VENTRAL HERNIA REPAIR  X4  LAST ONE 20 YRS AGO   ABDOMINAL --  EACH ONE IN DIFFERENT AREA  . VENTRAL HERNIA REPAIR  01/29/2012   Procedure: HERNIA REPAIR VENTRAL ADULT;  Surgeon: Theodoro Kos, DO;  Location: [place];  Service: Plastics;  Laterality: N/A;  . WOUND DEBRIDEMENT  09/25/2011   Procedure: DEBRIDEMENT CLOSURE/ABDOMINAL WOUND;  Surgeon: Theodoro Kos, DO;  Location: [place];  Service: Plastics;  Laterality: N/A;  excision of abdominal wound with primary closure     Medications: Current Outpatient Prescriptions  Medication Sig Dispense Refill  .  albuterol (PROVENTIL HFA;VENTOLIN HFA) 108 (90 Base) MCG/ACT inhaler Inhale 2 puffs into the lungs every 6 (six) hours as needed for wheezing or shortness of breath. 1 Inhaler 1  . carvedilol (COREG) 12.5 MG tablet TAKE 1 TABLET TWICE DAILY WITH A MEAL. 60 tablet 9  . citalopram (CELEXA) 20 MG tablet TAKE 1 TABLET EACH DAY. 90 tablet 2  . furosemide (LASIX) 40 MG tablet Take 1 tablet (40 mg total) by mouth as needed for fluid or edema. 30 tablet 11  . HYDROcodone-acetaminophen (NORCO/VICODIN) 5-325 MG tablet Take 1 tablet by mouth every 6 (six) hours as needed for moderate pain (back).    Marland Kitchen levothyroxine (SYNTHROID, LEVOTHROID) 75 MCG tablet TAKE 1 TABLET ONCE DAILY BEFORE BREAKFAST. 30 tablet 5  . losartan (COZAAR) 25 MG tablet Take 1 tablet (25 mg total) by mouth daily.  30 tablet 5  . pantoprazole (PROTONIX) 40 MG tablet TAKE 1 TABLET ONCE DAILY. 90 tablet 2  . predniSONE (DELTASONE) 10 MG tablet Take 1 tablet (10 mg total) by mouth daily with breakfast. 10 tablet 0  . predniSONE (DELTASONE) 5 MG tablet Take 1 tablet (5 mg total) by mouth daily with breakfast. 10 tablet 0  . Spacer/Aero-Holding Chambers (AEROCHAMBER MV) inhaler Use as instructed 1 each 0  . spironolactone (ALDACTONE) 25 MG tablet Take 0.5 tablets (12.5 mg total) by mouth at bedtime. 30 tablet 11   No current facility-administered medications for this visit.     Allergies: Allergies  Allergen Reactions  . Aspirin Other (See Comments)    HX Bleeding Ulcer   . Nsaids     Hx of bleeding ulcers  . Tolmetin Rash    Hx of bleeding ulcers    Social History: The patient  reports that she quit smoking about 6 years ago. Her smoking use included Cigarettes. She started smoking about 52 years ago. She has a 67.50 pack-year smoking history. She has never used smokeless tobacco. She reports that she does not drink alcohol or use drugs.   Family History: The patient's ***family history includes Diabetes in her brother and daughter; Emphysema in her sister; Heart disease in her sister; Stroke in her brother, mother, and sister; Tuberculosis in her father.   Review of Systems: Please see the history of present illness.   Otherwise, the review of systems is positive for {NONE DEFAULTED:18576::"none"}.   All other systems are reviewed and negative.   Physical Exam: VS:  There were no vitals taken for this visit. Marland Kitchen  BMI There is no height or weight on file to calculate BMI.  Wt Readings from Last 3 Encounters:  09/16/16 206 lb (93.4 kg)  08/19/16 208 lb (94.3 kg)  06/18/16 203 lb (92.1 kg)    General: Pleasant. Well developed, well nourished and in no acute distress.   HEENT: Normal.  Neck: Supple, no JVD, carotid bruits, or masses noted.  Cardiac: ***Regular rate and rhythm. No murmurs, rubs,  or gallops. No edema.  Respiratory:  Lungs are clear to auscultation bilaterally with normal work of breathing.  GI: Soft and nontender.  MS: No deformity or atrophy. Gait and ROM intact.  Skin: Warm and dry. Color is normal.  Neuro:  Strength and sensation are intact and no gross focal deficits noted.  Psych: Alert, appropriate and with normal affect.   LABORATORY DATA:  EKG:  EKG {ACTION; IS/IS PIR:[id number]} ordered today. This demonstrates ***.  Lab Results  Component Value Date   WBC 9.6 09/16/2016   HGB 12.4 04/23/2016  HCT 39.3 09/16/2016   PLT 221 09/16/2016   GLUCOSE 133 (H) 09/16/2016   CHOL 223 (H) 02/26/2016   TRIG 166 (H) 02/26/2016   HDL 35 (L) 02/26/2016   LDLDIRECT 182.5 09/12/2010   LDLCALC 155 (H) 02/26/2016   ALT 16 09/16/2016   AST 16 09/16/2016   NA 137 09/16/2016   K 4.5 09/16/2016   CL 100 09/16/2016   CREATININE 0.90 09/16/2016   BUN 20 09/16/2016   CO2 19 09/16/2016   TSH 1.170 09/16/2016   INR 1.08 02/04/2013   HGBA1C 6.0 08/19/2016   MICROALBUR 0.4 02/26/2016    BNP (last 3 results)  Recent Labs  03/26/16 1525  BNP 73.4    ProBNP (last 3 results) No results for input(s): PROBNP in the last 8760 hours.   Other Studies Reviewed Today:   Assessment/Plan:   Current medicines are reviewed with the patient today.  The patient does not have concerns regarding medicines other than what has been noted above.  The following changes have been made:  See above.  Labs/ tests ordered today include:   No orders of the defined types were placed in this encounter.    Disposition:   FU with *** in {gen number 8-92:119417} {Days to years:10300}.   Patient is agreeable to this plan and will call if any problems develop in the interim.   SignedTruitt Merle, NP  11/05/2016 7:34 AM  [facility] [address] [place], [place]  [address] Phone: [phone] Fax: [phone]

## 2016-11-06 ENCOUNTER — Other Ambulatory Visit: Payer: Self-pay | Admitting: Nurse Practitioner

## 2016-11-24 ENCOUNTER — Ambulatory Visit: Payer: Medicare Other | Admitting: Nurse Practitioner

## 2016-11-25 ENCOUNTER — Other Ambulatory Visit: Payer: Self-pay | Admitting: Family Medicine

## 2016-12-02 ENCOUNTER — Telehealth: Payer: Self-pay | Admitting: Family Medicine

## 2016-12-09 ENCOUNTER — Ambulatory Visit: Payer: Medicare Other | Admitting: Nurse Practitioner

## 2016-12-09 NOTE — Progress Notes (Deleted)
CARDIOLOGY OFFICE NOTE  Date:  12/09/2016    Teresa [name] Date of Birth: 02-08-1941 Medical Record #[id number]  PCP:  Wardell Honour, MD  Cardiologist:  Servando Snare & ***    No chief complaint on file.   History of Present Illness: Teresa [name] is a 76 y.o. female who presents today for a ***   Comes in today. Here with   Past Medical History:  Diagnosis Date  . Abdominal pain   . Anxiety    maintained on Xanax tid for years.  . Arthritis    FEET, KNEES, HANDS  . Chronic systolic CHF (congestive heart failure), NYHA class 2 ([place])    EF 25% 2011 MV,  50-55% echo 6/13  . Diabetes mellitus type 2, diet-controlled ([place])   . GERD (gastroesophageal reflux disease)   . Glaucoma    s/p surgery B in 30s.  [place].  . H/O hiatal hernia   . History of glaucoma   . History of small bowel obstruction 2009 AND NOV 2012  . HTN (hypertension)   . Hypothyroidism   . Left ovarian cyst   . Moderate mitral regurgitation   . Moderate tricuspid regurgitation   . Non-healing surgical wound    ABDOMINAL S/P EXPLORATOY LAPAROTOMY 04-18-2011  . Nonischemic cardiomyopathy ([place]) [facility]    EF is 25% per echo February 2012, non ischemic myoview 12/2009.  EF 50% echo 7/12 and plans for ICD cancelled.   . Peptic ulcer disease   . PVC (premature ventricular contraction)   . Shortness of breath   . Spinal stenosis   . Urge urinary incontinence   . Valvular heart disease    Moderate to severe MR, moderate TR, LAE per echo 7/11  . Wears dentures    upper denture-lower partial    Past Surgical History:  Procedure Laterality Date  . ABDOMINAL HERNIA REPAIR  32 YRS AGO   PERITINITIS  . APPENDECTOMY  1979   RUPTURED  . BENIGN RIGHT BREAST TUMOR REMOVED    . CARDIOVASCULAR STRESS TEST  JULY 2011-  DR HOCHREIN   NO ISCHEMIA  . EXPLORATOMY LAP. / EXTENSIVE LYSIS ADHESIONS/ SMALL BOWEL RESECTION X2 WITH PRIMARY ANASTOMOSIS X2  04-18-2011   SMALL BOWEL OBSTRUCTION  . GLAUCOMA SURGERY   1978  . HERNIA REPAIR    . INCISION AND DRAINAGE OF WOUND N/A 09/13/2012   Procedure: INCISION  AND DEBRIDEMENT WOUND abdominal wall ;  Surgeon: Rolm Bookbinder, MD;  Location: [place];  Service: General;  Laterality: N/A;  coordination with Dr Migdalia Dk   . KNEE ARTHROSCOPY W/ MENISCECTOMY  06-05-2004  . THYROIDECTOMY  1978 GOITOR   AND CERVICAL COLD KNIFE CONE BX  . TONSILLECTOMY  AGE 20  . TRANSTHORACIC ECHOCARDIOGRAM  12-27-2010   EF 45-50%/ MODERATE MV REGURG. / LEFT ATRIUM MILDLY DILATED/ MILD TRICUSPID REGURG.  . TUBAL LIGATION    . VENTRAL HERNIA REPAIR  X4  LAST ONE 20 YRS AGO   ABDOMINAL --  EACH ONE IN DIFFERENT AREA  . VENTRAL HERNIA REPAIR  01/29/2012   Procedure: HERNIA REPAIR VENTRAL ADULT;  Surgeon: Theodoro Kos, DO;  Location: [place];  Service: Plastics;  Laterality: N/A;  . WOUND DEBRIDEMENT  09/25/2011   Procedure: DEBRIDEMENT CLOSURE/ABDOMINAL WOUND;  Surgeon: Theodoro Kos, DO;  Location: [place];  Service: Plastics;  Laterality: N/A;  excision of abdominal wound with primary closure     Medications: No outpatient prescriptions have been marked as taking  for the 12/09/16 encounter (Appointment) with Burtis Junes, NP.     Allergies: Allergies  Allergen Reactions  . Aspirin Other (See Comments)    HX Bleeding Ulcer   . Nsaids     Hx of bleeding ulcers  . Tolmetin Rash    Hx of bleeding ulcers    Social History: The patient  reports that she quit smoking about 6 years ago. Her smoking use included Cigarettes. She started smoking about 52 years ago. She has a 67.50 pack-year smoking history. She has never used smokeless tobacco. She reports that she does not drink alcohol or use drugs.   Family History: The patient's ***family history includes Diabetes in her brother and daughter; Emphysema in her sister; Heart disease in her sister; Stroke in her brother, mother, and sister; Tuberculosis in her father.   Review of  Systems: Please see the history of present illness.   Otherwise, the review of systems is positive for {NONE DEFAULTED:18576::"none"}.   All other systems are reviewed and negative.   Physical Exam: VS:  There were no vitals taken for this visit. Marland Kitchen  BMI There is no height or weight on file to calculate BMI.  Wt Readings from Last 3 Encounters:  09/16/16 206 lb (93.4 kg)  08/19/16 208 lb (94.3 kg)  06/18/16 203 lb (92.1 kg)    General: Pleasant. Well developed, well nourished and in no acute distress.   HEENT: Normal.  Neck: Supple, no JVD, carotid bruits, or masses noted.  Cardiac: ***Regular rate and rhythm. No murmurs, rubs, or gallops. No edema.  Respiratory:  Lungs are clear to auscultation bilaterally with normal work of breathing.  GI: Soft and nontender.  MS: No deformity or atrophy. Gait and ROM intact.  Skin: Warm and dry. Color is normal.  Neuro:  Strength and sensation are intact and no gross focal deficits noted.  Psych: Alert, appropriate and with normal affect.   LABORATORY DATA:  EKG:  EKG {ACTION; IS/IS ION:[id number]} ordered today. This demonstrates ***.  Lab Results  Component Value Date   WBC 9.6 09/16/2016   HGB 13.0 09/16/2016   HCT 39.3 09/16/2016   PLT 221 09/16/2016   GLUCOSE 133 (H) 09/16/2016   CHOL 223 (H) 02/26/2016   TRIG 166 (H) 02/26/2016   HDL 35 (L) 02/26/2016   LDLDIRECT 182.5 09/12/2010   LDLCALC 155 (H) 02/26/2016   ALT 16 09/16/2016   AST 16 09/16/2016   NA 137 09/16/2016   K 4.5 09/16/2016   CL 100 09/16/2016   CREATININE 0.90 09/16/2016   BUN 20 09/16/2016   CO2 19 09/16/2016   TSH 1.170 09/16/2016   INR 1.08 02/04/2013   HGBA1C 6.0 08/19/2016   MICROALBUR 0.4 02/26/2016     BNP (last 3 results)  Recent Labs  03/26/16 1525  BNP 73.4    ProBNP (last 3 results) No results for input(s): PROBNP in the last 8760 hours.   Other Studies Reviewed Today:   Assessment/Plan:   Current medicines are reviewed with the  patient today.  The patient does not have concerns regarding medicines other than what has been noted above.  The following changes have been made:  See above.  Labs/ tests ordered today include:   No orders of the defined types were placed in this encounter.    Disposition:   FU with *** in {gen number 3-24:401027} {Days to years:10300}.   Patient is agreeable to this plan and will call if any problems develop in the interim.  SignedTruitt Merle, NP  12/09/2016 7:38 AM  [facility] [address] [place], [place]  [address] Phone: [phone] Fax: [phone]

## 2016-12-16 ENCOUNTER — Ambulatory Visit: Payer: Medicare Other | Admitting: Family Medicine

## 2016-12-23 ENCOUNTER — Ambulatory Visit (INDEPENDENT_AMBULATORY_CARE_PROVIDER_SITE_OTHER): Payer: Medicare Other | Admitting: Family Medicine

## 2016-12-23 ENCOUNTER — Encounter: Payer: Self-pay | Admitting: Family Medicine

## 2016-12-23 VITALS — BP 128/74 | HR 84 | Temp 97.6°F | Resp 16 | Ht 62.21 in | Wt 200.0 lb

## 2016-12-23 DIAGNOSIS — K219 Gastro-esophageal reflux disease without esophagitis: Secondary | ICD-10-CM | POA: Diagnosis not present

## 2016-12-23 DIAGNOSIS — I10 Essential (primary) hypertension: Secondary | ICD-10-CM | POA: Diagnosis not present

## 2016-12-23 DIAGNOSIS — M353 Polymyalgia rheumatica: Secondary | ICD-10-CM | POA: Diagnosis not present

## 2016-12-23 DIAGNOSIS — R5381 Other malaise: Secondary | ICD-10-CM

## 2016-12-23 DIAGNOSIS — F419 Anxiety disorder, unspecified: Secondary | ICD-10-CM

## 2016-12-23 DIAGNOSIS — I428 Other cardiomyopathies: Secondary | ICD-10-CM | POA: Diagnosis not present

## 2016-12-23 DIAGNOSIS — R5383 Other fatigue: Secondary | ICD-10-CM | POA: Diagnosis not present

## 2016-12-23 DIAGNOSIS — E034 Atrophy of thyroid (acquired): Secondary | ICD-10-CM | POA: Diagnosis not present

## 2016-12-23 DIAGNOSIS — F329 Major depressive disorder, single episode, unspecified: Secondary | ICD-10-CM | POA: Diagnosis not present

## 2016-12-23 DIAGNOSIS — E119 Type 2 diabetes mellitus without complications: Secondary | ICD-10-CM

## 2016-12-23 DIAGNOSIS — N181 Chronic kidney disease, stage 1: Secondary | ICD-10-CM | POA: Diagnosis not present

## 2016-12-23 LAB — POCT URINALYSIS DIP (MANUAL ENTRY)
BILIRUBIN UA: NEGATIVE
BILIRUBIN UA: NEGATIVE mg/dL
Blood, UA: NEGATIVE
GLUCOSE UA: NEGATIVE mg/dL
Nitrite, UA: NEGATIVE
Protein Ur, POC: NEGATIVE mg/dL
Urobilinogen, UA: 0.2 E.U./dL
pH, UA: 5.5 (ref 5.0–8.0)

## 2016-12-23 NOTE — Progress Notes (Signed)
Subjective:    Patient ID: Teresa [name], female    DOB: May 13, 1941, 76 y.o.   MRN: [id number]  12/23/2016  Depression (3 month follow-up)   HPI This 76 y.o. female presents for evaluation of depression/anxiety, PMR, and diarrhea.  Doing much better.  Diarrhea is much better now.   Currently taking Prednisone 6mg  for PMR. Stool studies and C. Difficile negative.  Started doing things on own.  Eliminated milk but has switched to lactose free milk.  Really good.  Also made other changes; two weeks ago, went to start probiotic which has really helped.  Has gone three days without imodium.  Having 3 stools per day.  Small stools.    Fatigue: no energy; decreased energy; must stop and rest; cannot go out and have fun.  Wears out.  Very interested in doing things.  Prednisone is helping either.    Has upcoming appointment with cardiology this month; had to reschedule three times due to diarrhea.  No problems.  No chest pain, DOE?SOB, swelling.    Blood rash: B forearms.  No nosebleeds; no epistaxis, no gums bleeding; no hematuria, no bloody stools.    Taking Protonix qod instead of daily; did not want to stop it all together. No longer having tingling in legs; having transient pain in legs.   BP Readings from Last 3 Encounters:  12/23/16 128/74  09/16/16 126/70  08/19/16 128/74   Wt Readings from Last 3 Encounters:  12/23/16 200 lb (90.7 kg)  09/16/16 206 lb (93.4 kg)  08/19/16 208 lb (94.3 kg)   Immunization History  Administered Date(s) Administered  . Influenza,inj,Quad PF,36+ Mos 04/20/2013, 02/26/2016  . Pneumococcal Conjugate-13 03/01/2014  . Pneumococcal-Unspecified 06/16/2010    Review of Systems  Constitutional: Positive for fatigue. Negative for chills, diaphoresis and fever.  Eyes: Negative for visual disturbance.  Respiratory: Negative for cough and shortness of breath.   Cardiovascular: Negative for chest pain, palpitations and leg swelling.  Gastrointestinal:  Negative for abdominal pain, constipation, diarrhea, nausea and vomiting.  Endocrine: Negative for cold intolerance, heat intolerance, polydipsia, polyphagia and polyuria.  Musculoskeletal: Positive for arthralgias.  Skin: Positive for rash.  Neurological: Negative for dizziness, tremors, seizures, syncope, facial asymmetry, speech difficulty, weakness, light-headedness, numbness and headaches.  Psychiatric/Behavioral: Positive for dysphoric mood. The patient is not nervous/anxious.     Past Medical History:  Diagnosis Date  . Abdominal pain   . Anxiety    maintained on Xanax tid for years.  . Arthritis    FEET, KNEES, HANDS  . Chronic systolic CHF (congestive heart failure), NYHA class 2 ([place])    EF 25% 2011 MV,  50-55% echo 6/13  . Diabetes mellitus type 2, diet-controlled ([place])   . GERD (gastroesophageal reflux disease)   . Glaucoma    s/p surgery B in 30s.  [place].  . H/O hiatal hernia   . History of glaucoma   . History of small bowel obstruction 2009 AND NOV 2012  . HTN (hypertension)   . Hypothyroidism   . Left ovarian cyst   . Moderate mitral regurgitation   . Moderate tricuspid regurgitation   . Non-healing surgical wound    ABDOMINAL S/P EXPLORATOY LAPAROTOMY 04-18-2011  . Nonischemic cardiomyopathy ([place]) [facility]    EF is 25% per echo February 2012, non ischemic myoview 12/2009.  EF 50% echo 7/12 and plans for ICD cancelled.   . Peptic ulcer disease   . PVC (premature ventricular contraction)   . Shortness of breath   .  Spinal stenosis   . Urge urinary incontinence   . Valvular heart disease    Moderate to severe MR, moderate TR, LAE per echo 7/11  . Wears dentures    upper denture-lower partial   Past Surgical History:  Procedure Laterality Date  . ABDOMINAL HERNIA REPAIR  32 YRS AGO   PERITINITIS  . APPENDECTOMY  1979   RUPTURED  . BENIGN RIGHT BREAST TUMOR REMOVED    . CARDIOVASCULAR STRESS TEST  JULY 2011-  DR HOCHREIN   NO ISCHEMIA  . EXPLORATOMY  LAP. / EXTENSIVE LYSIS ADHESIONS/ SMALL BOWEL RESECTION X2 WITH PRIMARY ANASTOMOSIS X2  04-18-2011   SMALL BOWEL OBSTRUCTION  . GLAUCOMA SURGERY  1978  . HERNIA REPAIR    . INCISION AND DRAINAGE OF WOUND N/A 09/13/2012   Procedure: INCISION  AND DEBRIDEMENT WOUND abdominal wall ;  Surgeon: Rolm Bookbinder, MD;  Location: [place];  Service: General;  Laterality: N/A;  coordination with Dr Migdalia Dk   . KNEE ARTHROSCOPY W/ MENISCECTOMY  06-05-2004  . THYROIDECTOMY  1978 GOITOR   AND CERVICAL COLD KNIFE CONE BX  . TONSILLECTOMY  AGE 69  . TRANSTHORACIC ECHOCARDIOGRAM  12-27-2010   EF 45-50%/ MODERATE MV REGURG. / LEFT ATRIUM MILDLY DILATED/ MILD TRICUSPID REGURG.  . TUBAL LIGATION    . VENTRAL HERNIA REPAIR  X4  LAST ONE 20 YRS AGO   ABDOMINAL --  EACH ONE IN DIFFERENT AREA  . VENTRAL HERNIA REPAIR  01/29/2012   Procedure: HERNIA REPAIR VENTRAL ADULT;  Surgeon: Theodoro Kos, DO;  Location: [facility];  Service: Plastics;  Laterality: N/A;  . WOUND DEBRIDEMENT  09/25/2011   Procedure: DEBRIDEMENT CLOSURE/ABDOMINAL WOUND;  Surgeon: Theodoro Kos, DO;  Location: [facility];  Service: Plastics;  Laterality: N/A;  excision of abdominal wound with primary closure   Allergies  Allergen Reactions  . Aspirin Other (See Comments)    HX Bleeding Ulcer   . Nsaids     Hx of bleeding ulcers  . Tolmetin Rash    Hx of bleeding ulcers    Social History   Social History  . Marital status: Widowed    Spouse name: N/A  . Number of children: 2  . Years of education: N/A   Occupational History  . RETIRED    Social History Main Topics  . Smoking status: Former Smoker    Packs/day: 1.50    Years: 45.00    Types: Cigarettes    Start date: 11/14/1964    Quit date: 09/22/2010  . Smokeless tobacco: Never Used     Comment: quit 11-Sep-2009 or 12-Sep-2010 - Didn't smoke during pregnancies  . Alcohol use No  . Drug use: No  . Sexual activity: Not Currently   Other Topics Concern  . Not  on file   Social History Narrative   Marital status:  Widowed as of September 12, 2010; not dating.        Children: four children (1 daughter in [place], 1 daughter in [place] estranged; 1 son in [place]; 1 son deceased in 09/12/2014); six grandchildren; 5 gg.      Lives: alone with a yorkie.  Son/Harry lives close.        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      Tobacco:  None; quit in 09-12-10.        Alcohol: none      Drugs:none      Exercise:  No formal exercise program.  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  Cannot swim.        ADLs:  Driving; cleans own house; does grocery shopping; pays bills; does not use cane for ambulation.  Drives.        Advanced Directives: no living will; no HCPOA.  DNR/DNI.        Lupus Pulmonary:   Originally from [facility]. Has always lived in [place]. She moved to [place], [place]. 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 Has a dog currently. No bird or mold exposure.    Family History  Problem Relation Age of Onset  . Stroke Brother   . Diabetes Brother   . Tuberculosis Father   . Stroke Mother   . Stroke Sister   . Heart disease Sister   . Emphysema Sister   . Diabetes Daughter        Objective:    BP 128/74   Pulse 84   Temp 97.6 F (36.4 C) (Oral)   Resp 16   Ht 5' 2.21" (1.58 m)   Wt 200 lb (90.7 kg)   SpO2 94%   BMI 36.34 kg/m  Physical Exam  Constitutional: She is oriented to person, place, and time. She appears well-developed and well-nourished. No distress.  HENT:  Head: Normocephalic and atraumatic.  Right Ear: External ear normal.  Left Ear: External ear normal.  Nose: Nose normal.  Mouth/Throat: Oropharynx is clear and moist.  Eyes: Conjunctivae and EOM are normal. Pupils are equal, round, and reactive to light.  Neck: Normal range of motion. Neck supple. Carotid bruit is not present. No thyromegaly present.  Cardiovascular: Normal rate, regular rhythm, normal heart  sounds and intact distal pulses.  Exam reveals no gallop and no friction rub.   No murmur heard. Pulmonary/Chest: Effort normal and breath sounds normal. She has no wheezes. She has no rales.  Abdominal: Soft. Bowel sounds are normal. She exhibits no distension and no mass. There is no tenderness. There is no rebound and no guarding.  Lymphadenopathy:    She has no cervical adenopathy.  Neurological: She is alert and oriented to person, place, and time. No cranial nerve deficit.  Skin: Skin is warm and dry. Rash noted. She is not diaphoretic. No erythema. No pallor.  +petechial rash along B forearms.  Non-blanching rash.  Psychiatric: She has a normal mood and affect. Her behavior is normal.   Results for orders placed or performed in visit on 09/16/16  Clostridium Difficile by PCR  Result Value Ref Range   Toxigenic C Difficile by pcr Negative Negative  Stool culture  Result Value Ref Range   Salmonella/Shigella Screen Final report    Stool Culture result 1 ([facility]) Comment    Campylobacter Culture Final report    Stool Culture result 1 ([facility]MP[facility]) Comment    E coli, Shiga toxin Assay Negative Negative  Ova and parasite examination  Result Value Ref Range   OVA + PARASITE EXAM Final report    O&P result 1 Comment   CBC with Differential/Platelet  Result Value Ref Range   WBC 9.6 3.4 - 10.8 x10E3/uL   RBC 4.07 3.77 - 5.28 x10E6/uL   Hemoglobin 13.0 11.1 - 15.9 g/dL   Hematocrit 39.3 34.0 - 46.6 %   MCV 97 79 - 97 fL   MCH 31.9 26.6 - 33.0 pg   MCHC 33.1 31.5 - 35.7 g/dL   RDW 14.6 12.3 - 15.4 %   Platelets 221 150 - 379 x10E3/uL  Neutrophils 79 Not Estab. %   Lymphs 14 Not Estab. %   Monocytes 4 Not Estab. %   Eos 0 Not Estab. %   Basos 0 Not Estab. %   Neutrophils Absolute 7.5 (H) 1.4 - 7.0 x10E3/uL   Lymphocytes Absolute 1.3 0.7 - 3.1 x10E3/uL   Monocytes Absolute 0.4 0.1 - 0.9 x10E3/uL   EOS (ABSOLUTE) 0.0 0.0 - 0.4 x10E3/uL   Basophils Absolute 0.0 0.0 - 0.2 x10E3/uL     Immature Granulocytes 3 Not Estab. %   Immature Grans (Abs) 0.3 (H) 0.0 - 0.1 x10E3/uL  Comprehensive metabolic panel  Result Value Ref Range   Glucose 133 (H) 65 - 99 mg/dL   BUN 20 8 - 27 mg/dL   Creatinine, Ser 0.90 0.57 - 1.00 mg/dL   GFR calc non Af Amer 63 >59 mL/min/1.73   GFR calc Af Amer 72 >59 mL/min/1.73   BUN/Creatinine Ratio 22 12 - 28   Sodium 137 134 - 144 mmol/L   Potassium 4.5 3.5 - 5.2 mmol/L   Chloride 100 96 - 106 mmol/L   CO2 19 18 - 29 mmol/L   Calcium 9.2 8.7 - 10.3 mg/dL   Total Protein 6.7 6.0 - 8.5 g/dL   Albumin 4.2 3.5 - 4.8 g/dL   Globulin, Total 2.5 1.5 - 4.5 g/dL   Albumin/Globulin Ratio 1.7 1.2 - 2.2   Bilirubin Total 0.3 0.0 - 1.2 mg/dL   Alkaline Phosphatase 62 39 - 117 IU/L   AST 16 0 - 40 IU/L   ALT 16 0 - 32 IU/L  TSH  Result Value Ref Range   TSH 1.170 0.450 - 4.500 uIU/mL  T4, free  Result Value Ref Range   Free T4 1.38 0.82 - 1.77 ng/dL       Assessment & Plan:   1. Essential (primary) hypertension   2. Gastroesophageal reflux disease without esophagitis   3. Nonischemic cardiomyopathy ([place])   4. Diabetes mellitus type 2, diet-controlled ([place])   5. Hypothyroidism due to acquired atrophy of thyroid   6. Chronic renal impairment, stage 1   7. Anxiety and depression   8. Polymyalgia rheumatica ([place])   9. Malaise and fatigue    -improving diarrhea; stool studies negative; continue to monitor and eat a bland diet.  Increase Protonix to one daily while taking Prednisone.  Continue probiotic therapy. -PMR improving and intolerant ot Prednisone so has decreased dose successfully.  Followed by rheumatology. -depression improving and becoming more active; encourage increased activity for patient. -suffering with fatigue; obtain labs; encourage increased activity.  Orders Placed This Encounter  Procedures  . CBC with Differential/Platelet  . Comprehensive metabolic panel  . TSH  . POCT urinalysis dipstick   No orders of the  defined types were placed in this encounter.   No Follow-up on file.   Noeli Lavery Elayne Guerin, M.D. Primary Care at [facility] [address] [place], [place]  [phone] phone [phone] fax

## 2016-12-23 NOTE — Patient Instructions (Addendum)
INCREASE PROTONIX/PANTOPRAZOLE 40MG  ONE TABLET DAILY. CONTINUE PROBIOTIC ONE DAILY.    IF you received an x-ray today, you will receive an invoice from [facility]. Please contact [facility] at [phone] with questions or concerns regarding your invoice.   IF you received labwork today, you will receive an invoice from [facility]. Please contact [facility] at [phone] with questions or concerns regarding your invoice.   Our billing staff will not be able to assist you with questions regarding bills from these companies.  You will be contacted with the lab results as soon as they are available. The fastest way to get your results is to activate your My Chart account. Instructions are located on the last page of this paperwork. If you have not heard from [place] regarding the results in 2 weeks, please contact this office.

## 2016-12-24 LAB — CBC WITH DIFFERENTIAL/PLATELET
BASOS: 0 %
Basophils Absolute: 0 10*3/uL (ref 0.0–0.2)
EOS (ABSOLUTE): 0.1 10*3/uL (ref 0.0–0.4)
EOS: 1 %
HEMATOCRIT: 38.2 % (ref 34.0–46.6)
Hemoglobin: 12.2 g/dL (ref 11.1–15.9)
IMMATURE GRANULOCYTES: 2 %
Immature Grans (Abs): 0.2 10*3/uL — ABNORMAL HIGH (ref 0.0–0.1)
LYMPHS ABS: 1.4 10*3/uL (ref 0.7–3.1)
Lymphs: 17 %
MCH: 31.3 pg (ref 26.6–33.0)
MCHC: 31.9 g/dL (ref 31.5–35.7)
MCV: 98 fL — AB (ref 79–97)
MONOS ABS: 0.4 10*3/uL (ref 0.1–0.9)
Monocytes: 5 %
NEUTROS PCT: 75 %
Neutrophils Absolute: 6.2 10*3/uL (ref 1.4–7.0)
PLATELETS: 200 10*3/uL (ref 150–379)
RBC: 3.9 x10E6/uL (ref 3.77–5.28)
RDW: 13.7 % (ref 12.3–15.4)
WBC: 8.3 10*3/uL (ref 3.4–10.8)

## 2016-12-24 LAB — COMPREHENSIVE METABOLIC PANEL
A/G RATIO: 1.9 (ref 1.2–2.2)
ALK PHOS: 74 IU/L (ref 39–117)
ALT: 19 IU/L (ref 0–32)
AST: 23 IU/L (ref 0–40)
Albumin: 4.2 g/dL (ref 3.5–4.8)
BILIRUBIN TOTAL: 0.3 mg/dL (ref 0.0–1.2)
BUN/Creatinine Ratio: 21 (ref 12–28)
BUN: 20 mg/dL (ref 8–27)
CALCIUM: 9.4 mg/dL (ref 8.7–10.3)
CO2: 21 mmol/L (ref 20–29)
Chloride: 103 mmol/L (ref 96–106)
Creatinine, Ser: 0.97 mg/dL (ref 0.57–1.00)
GFR calc Af Amer: 66 mL/min/{1.73_m2} (ref >59)
GFR, EST NON AFRICAN AMERICAN: 57 mL/min/{1.73_m2} — AB (ref >59)
GLOBULIN, TOTAL: 2.2 g/dL (ref 1.5–4.5)
Glucose: 111 mg/dL — ABNORMAL HIGH (ref 65–99)
POTASSIUM: 5.4 mmol/L — AB (ref 3.5–5.2)
SODIUM: 140 mmol/L (ref 134–144)
Total Protein: 6.4 g/dL (ref 6.0–8.5)

## 2016-12-24 LAB — TSH: TSH: 2.31 u[IU]/mL (ref 0.450–4.500)

## 2016-12-31 ENCOUNTER — Ambulatory Visit (INDEPENDENT_AMBULATORY_CARE_PROVIDER_SITE_OTHER): Payer: Medicare Other | Admitting: Nurse Practitioner

## 2016-12-31 ENCOUNTER — Encounter: Payer: Self-pay | Admitting: Nurse Practitioner

## 2016-12-31 VITALS — BP 130/80 | HR 66 | Ht 63.0 in | Wt 202.5 lb

## 2016-12-31 DIAGNOSIS — R0600 Dyspnea, unspecified: Secondary | ICD-10-CM

## 2016-12-31 DIAGNOSIS — R0609 Other forms of dyspnea: Secondary | ICD-10-CM

## 2016-12-31 DIAGNOSIS — I428 Other cardiomyopathies: Secondary | ICD-10-CM

## 2016-12-31 NOTE — Patient Instructions (Addendum)
We will be checking the following labs today - NONE   Medication Instructions:    Continue with your current medicines.     Testing/Procedures To Be Arranged:  N/A  Follow-Up:   See me in 6 to 8 months.     Other Special Instructions:   N/A    If you need a refill on your cardiac medications before your next appointment, please call your pharmacy.   Call the [place] office at [phone] if you have any questions, problems or concerns.

## 2016-12-31 NOTE — Progress Notes (Signed)
CARDIOLOGY OFFICE NOTE  Date:  12/31/2016    Teresa [name] Date of Birth: 28-Aug-1940 Medical Record #[id number]  PCP:  Wardell Honour, MD  Cardiologist:  [place]    Chief Complaint  Patient presents with  . Cardiomyopathy    Follow up visit - seen for Dr. Percival Spanish.    History of Present Illness: Teresa [name] is a 76 y.o. female who presents today for a follow up visit. This is a 9 month check. Seen for Dr. Percival Spanish - but I don't think she has ever seen him - she is a former patient of Dr. Susa Simmonds.   She has a nonischemic CM. Past EF down to 25%. EF improved with medicines. Plans for an ICD in the past were cancelled due to recovery of her EF. Other issues include tobacco abuse, HTN, PVCs, DM, valvular heart disease, spinal stenosis and glaucoma. Had a small bowel obstruction back in November of 2012 and had surgery with delayed wound healing. She does not have any known CAD.   I saw her back in April of 2016 - more weight gain - more short of breath. Updated her echo.This was ok. Referred for PFTs but she did not follow thru.   Seen by me back in 05/04/15 - her son had died. Lots of grief and stress. More short of breath. Finally got a CXR on her - she still did not proceed with PFTs. CXR with carotid calcification - got a doppler which was basically normal.   Last seen by me back in 05/03/2016 - was not doing well. Lots of anxiety. More shortness of breath. Had never got her breathing test done. It remains very hard for her to come back into this office - her son who has passed was cared for here. Finally got her to see pulmonary - most likely with underlying emphysema. Looks like she was to get a CT and follow back up with Dr. Ashok Cordia - I don't see where she followed thru.   Comes in today. Here with her son Teresa [name] today. She has been diagnosed with PMR - on prednisone. More issues with diarrhea as well - her studies were negative. Says her heart is  ok. She remains short of breath. She says the diarrhea has just "taken over life" and that is why she has not seen pulmonary. But the Prednisone is helping with her joint/pain issues. Did get referred to rheumatology. She is tapering down now on her prednisone. No chest pain. Recent labs noted. Still with lots of back pain and chronic fatigue. Really not able to walk any distance or stand more than a few minutes.   Past Medical History:  Diagnosis Date  . Abdominal pain   . Anxiety    maintained on Xanax tid for years.  . Arthritis    FEET, KNEES, HANDS  . Chronic systolic CHF (congestive heart failure), NYHA class 2 ([place])    EF 25% 2011 MV,  50-55% echo 6/13  . Diabetes mellitus type 2, diet-controlled ([place])   . GERD (gastroesophageal reflux disease)   . Glaucoma    s/p surgery B in 30s.  [place].  . H/O hiatal hernia   . History of glaucoma   . History of small bowel obstruction 2009 AND NOV 2012  . HTN (hypertension)   . Hypothyroidism   . Left ovarian cyst   . Moderate mitral regurgitation   . Moderate tricuspid regurgitation   .  Non-healing surgical wound    ABDOMINAL S/P EXPLORATOY LAPAROTOMY 04-18-2011  . Nonischemic cardiomyopathy ([place]) [facility]    EF is 25% per echo February 2012, non ischemic myoview 12/2009.  EF 50% echo 7/12 and plans for ICD cancelled.   . Peptic ulcer disease   . PVC (premature ventricular contraction)   . Shortness of breath   . Spinal stenosis   . Urge urinary incontinence   . Valvular heart disease    Moderate to severe MR, moderate TR, LAE per echo 7/11  . Wears dentures    upper denture-lower partial    Past Surgical History:  Procedure Laterality Date  . ABDOMINAL HERNIA REPAIR  32 YRS AGO   PERITINITIS  . APPENDECTOMY  1979   RUPTURED  . BENIGN RIGHT BREAST TUMOR REMOVED    . CARDIOVASCULAR STRESS TEST  JULY 2011-  DR HOCHREIN   NO ISCHEMIA  . EXPLORATOMY LAP. / EXTENSIVE LYSIS ADHESIONS/ SMALL BOWEL RESECTION X2 WITH PRIMARY  ANASTOMOSIS X2  04-18-2011   SMALL BOWEL OBSTRUCTION  . GLAUCOMA SURGERY  1978  . HERNIA REPAIR    . INCISION AND DRAINAGE OF WOUND N/A 09/13/2012   Procedure: INCISION  AND DEBRIDEMENT WOUND abdominal wall ;  Surgeon: Rolm Bookbinder, MD;  Location: [facility];  Service: General;  Laterality: N/A;  coordination with Dr Migdalia Dk   . KNEE ARTHROSCOPY W/ MENISCECTOMY  06-05-2004  . THYROIDECTOMY  1978 GOITOR   AND CERVICAL COLD KNIFE CONE BX  . TONSILLECTOMY  AGE 25  . TRANSTHORACIC ECHOCARDIOGRAM  12-27-2010   EF 45-50%/ MODERATE MV REGURG. / LEFT ATRIUM MILDLY DILATED/ MILD TRICUSPID REGURG.  . TUBAL LIGATION    . VENTRAL HERNIA REPAIR  X4  LAST ONE 20 YRS AGO   ABDOMINAL --  EACH ONE IN DIFFERENT AREA  . VENTRAL HERNIA REPAIR  01/29/2012   Procedure: HERNIA REPAIR VENTRAL ADULT;  Surgeon: Theodoro Kos, DO;  Location: [facility];  Service: Plastics;  Laterality: N/A;  . WOUND DEBRIDEMENT  09/25/2011   Procedure: DEBRIDEMENT CLOSURE/ABDOMINAL WOUND;  Surgeon: Theodoro Kos, DO;  Location: [facility];  Service: Plastics;  Laterality: N/A;  excision of abdominal wound with primary closure     Medications: Current Meds  Medication Sig  . albuterol (PROVENTIL HFA;VENTOLIN HFA) 108 (90 Base) MCG/ACT inhaler Inhale 2 puffs into the lungs every 6 (six) hours as needed for wheezing or shortness of breath.  . carvedilol (COREG) 12.5 MG tablet TAKE 1 TABLET TWICE DAILY WITH A MEAL.  . citalopram (CELEXA) 20 MG tablet TAKE 1 TABLET EACH DAY.  . furosemide (LASIX) 40 MG tablet Take 1 tablet (40 mg total) by mouth as needed for fluid or edema.  Marland Kitchen HYDROcodone-acetaminophen (NORCO/VICODIN) 5-325 MG tablet Take 1 tablet by mouth every 6 (six) hours as needed for moderate pain (back).  Marland Kitchen levothyroxine (SYNTHROID, LEVOTHROID) 75 MCG tablet TAKE 1 TABLET ONCE DAILY BEFORE BREAKFAST.  Marland Kitchen losartan (COZAAR) 25 MG tablet Take 1 tablet (25 mg total) by mouth daily.  . pantoprazole  (PROTONIX) 40 MG tablet TAKE 1 TABLET ONCE DAILY.  Marland Kitchen predniSONE (DELTASONE) 5 MG tablet Take 1 tablet (5 mg total) by mouth daily with breakfast. (Patient taking differently: Take 6 mg by mouth daily with breakfast. )  . Probiotic Product (PRO-BIOTIC BLEND PO) Take 1 tablet by mouth daily.  Marland Kitchen Spacer/Aero-Holding Chambers (AEROCHAMBER MV) inhaler Use as instructed  . spironolactone (ALDACTONE) 25 MG tablet Take 0.5 tablets (12.5 mg total) by mouth at  bedtime.     Allergies: Allergies  Allergen Reactions  . Aspirin Other (See Comments)    HX Bleeding Ulcer   . Nsaids     Hx of bleeding ulcers  . Tolmetin Rash    Hx of bleeding ulcers    Social History: The patient  reports that she quit smoking about 6 years ago. Her smoking use included Cigarettes. She started smoking about 52 years ago. She has a 67.50 pack-year smoking history. She has never used smokeless tobacco. She reports that she does not drink alcohol or use drugs.   Family History: The patient's family history includes Diabetes in her brother and daughter; Emphysema in her sister; Heart disease in her sister; Stroke in her brother, mother, and sister; Tuberculosis in her father.   Review of Systems: Please see the history of present illness.   Otherwise, the review of systems is positive for none.   All other systems are reviewed and negative.   Physical Exam: VS:  BP 130/80 (BP Location: Left Arm, Patient Position: Sitting, Cuff Size: Normal)   Pulse 66   Ht 5\' 3"  (1.6 m)   Wt 202 lb 8.5 oz (91.9 kg)   BMI 35.88 kg/m  .  BMI Body mass index is 35.88 kg/m.  Wt Readings from Last 3 Encounters:  12/31/16 202 lb 8.5 oz (91.9 kg)  12/23/16 200 lb (90.7 kg)  09/16/16 206 lb (93.4 kg)    General: Pleasant. Morbidly obese. Alert and in no acute distress.   HEENT: Normal.  Neck: Supple, no JVD, carotid bruits, or masses noted.  Cardiac: Regular rate and rhythm. No murmurs, rubs, or gallops. No edema.  Respiratory:   Lungs are clear to auscultation bilaterally with normal work of breathing.  GI: Soft and nontender.  MS: No deformity or atrophy. Gait and ROM intact.  Skin: Warm and dry. Color is normal.  Neuro:  Strength and sensation are intact and no gross focal deficits noted.  Psych: Alert, appropriate and with normal affect.   LABORATORY DATA:  EKG:  EKG is ordered today. This demonstrates NSR/sinus arrhythmia with nonspecific ST and T wave changes.  Lab Results  Component Value Date   WBC 8.3 12/23/2016   HGB 12.2 12/23/2016   HCT 38.2 12/23/2016   PLT 200 12/23/2016   GLUCOSE 111 (H) 12/23/2016   CHOL 223 (H) 02/26/2016   TRIG 166 (H) 02/26/2016   HDL 35 (L) 02/26/2016   LDLDIRECT 182.5 09/12/2010   LDLCALC 155 (H) 02/26/2016   ALT 19 12/23/2016   AST 23 12/23/2016   NA 140 12/23/2016   K 5.4 (H) 12/23/2016   CL 103 12/23/2016   CREATININE 0.97 12/23/2016   BUN 20 12/23/2016   CO2 21 12/23/2016   TSH 2.310 12/23/2016   INR 1.08 02/04/2013   HGBA1C 6.0 08/19/2016   MICROALBUR 0.4 02/26/2016     BNP (last 3 results)  Recent Labs  03/26/16 1525  BNP 73.4    ProBNP (last 3 results) No results for input(s): PROBNP in the last 8760 hours.   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  Follow-up MRI abdomen with/ without contrast is suggested in 12 months.  This recommendation follows ACR consensus guidelines: Managing Incidental Findings on Abdominal CT: White Paper of the ACR Incidental Findings Committee. J Am Coll Radiol 2010;7:754-773.   Electronically  Signed  By: Julian Hy M.D.  On: 09/18/2015 12:36  Echo Study Conclusions from 09/2014  - Left ventricle: Posterior basal hypokinesis. Wall thickness was increased in a pattern of mild LVH. Systolic function was normal. The  estimated ejection fraction was in the range of 50% to 55%. - Aortic valve: There was mild regurgitation. - Mitral valve: There was mild regurgitation. - Left atrium: The atrium was mildly dilated. - Atrial septum: No defect or patent foramen ovale was identified. - Pericardium, extracardiac: A trivial pericardial effusion was identified posterior to the heart  Assessment/Plan:  1. Nonischemic CM - her echo was ok from 2016. She looks stable. Her other medical issues are more pressing.   2. Dyspnea/Tobacco abuse - felt to have emphysema - she has not followed up with Dr. Ammie Dalton recommendations due to getting PMR/chronic diarrhea  3. Obesity - very limiting for her. I still do not see this improving.   4. Fatigue - TSH followed by PCP - recent check ok.   5. HLD   6. Chronic diarrhea  7. PMR - on steroid therapy.   Current medicines are reviewed with the patient today.  The patient does not have concerns regarding medicines other than what has been noted above.  The following changes have been made:  See above.  Labs/ tests ordered today include:    Orders Placed This Encounter  Procedures  . EKG 12-Lead     Disposition:   FU with me in 6 to 8 months.   Patient is agreeable to this plan and will call if any problems develop in the interim.   SignedTruitt Merle, NP  12/31/2016 3:10 PM  [facility] [address] [place],   [address] Phone: [phone] Fax: [phone]

## 2017-01-13 ENCOUNTER — Other Ambulatory Visit: Payer: Self-pay | Admitting: Family Medicine

## 2017-01-13 DIAGNOSIS — K863 Pseudocyst of pancreas: Secondary | ICD-10-CM

## 2017-01-13 DIAGNOSIS — E034 Atrophy of thyroid (acquired): Secondary | ICD-10-CM

## 2017-01-13 DIAGNOSIS — R7302 Impaired glucose tolerance (oral): Secondary | ICD-10-CM

## 2017-01-13 DIAGNOSIS — K591 Functional diarrhea: Secondary | ICD-10-CM

## 2017-01-14 ENCOUNTER — Other Ambulatory Visit: Payer: Self-pay | Admitting: Family Medicine

## 2017-01-14 DIAGNOSIS — E034 Atrophy of thyroid (acquired): Secondary | ICD-10-CM

## 2017-01-14 DIAGNOSIS — K863 Pseudocyst of pancreas: Secondary | ICD-10-CM

## 2017-01-14 DIAGNOSIS — R7302 Impaired glucose tolerance (oral): Secondary | ICD-10-CM

## 2017-01-14 DIAGNOSIS — K591 Functional diarrhea: Secondary | ICD-10-CM

## 2017-04-06 ENCOUNTER — Other Ambulatory Visit: Payer: Self-pay | Admitting: Nurse Practitioner

## 2017-04-07 ENCOUNTER — Other Ambulatory Visit: Payer: Self-pay | Admitting: Family Medicine

## 2017-04-07 DIAGNOSIS — E034 Atrophy of thyroid (acquired): Secondary | ICD-10-CM

## 2017-04-07 DIAGNOSIS — K591 Functional diarrhea: Secondary | ICD-10-CM

## 2017-04-07 DIAGNOSIS — K863 Pseudocyst of pancreas: Secondary | ICD-10-CM

## 2017-04-07 DIAGNOSIS — R7302 Impaired glucose tolerance (oral): Secondary | ICD-10-CM

## 2017-05-27 ENCOUNTER — Other Ambulatory Visit: Payer: Self-pay | Admitting: Urgent Care

## 2017-06-04 ENCOUNTER — Ambulatory Visit: Payer: Medicare Other

## 2017-06-04 ENCOUNTER — Telehealth: Payer: Self-pay | Admitting: *Deleted

## 2017-06-04 NOTE — Telephone Encounter (Signed)
Copied from [place] [id number]. Topic: Medicare AWV >> Jun 03, 2017 12:09 PM Aurelio Brash B wrote: Reason for CRM: Pt wants to schedule her AWV  on same day as her cpe  but looking at schedule now  there would be a 2 hour gap  between apts.  She wants Elmyra Ricks to call her to talk about this apt..  It is hard for her to make 2 different different days  Call after 3pm  on her mobile phone

## 2017-06-04 NOTE — Telephone Encounter (Signed)
Spoke with patient and she prefers to do AWV with PCP due to scheduling and not having to come in twice.

## 2017-06-08 ENCOUNTER — Encounter: Payer: Medicare Other | Admitting: Family Medicine

## 2017-06-15 ENCOUNTER — Encounter: Payer: Medicare Other | Admitting: Family Medicine

## 2017-07-06 ENCOUNTER — Other Ambulatory Visit: Payer: Self-pay | Admitting: Family Medicine

## 2017-07-06 DIAGNOSIS — E034 Atrophy of thyroid (acquired): Secondary | ICD-10-CM

## 2017-07-06 DIAGNOSIS — R7302 Impaired glucose tolerance (oral): Secondary | ICD-10-CM

## 2017-07-06 DIAGNOSIS — K591 Functional diarrhea: Secondary | ICD-10-CM

## 2017-07-06 DIAGNOSIS — K863 Pseudocyst of pancreas: Secondary | ICD-10-CM

## 2017-08-03 ENCOUNTER — Other Ambulatory Visit: Payer: Self-pay | Admitting: Urgent Care

## 2017-08-19 ENCOUNTER — Encounter: Payer: Self-pay | Admitting: Nurse Practitioner

## 2017-08-24 ENCOUNTER — Other Ambulatory Visit: Payer: Self-pay | Admitting: Family Medicine

## 2017-08-26 ENCOUNTER — Ambulatory Visit: Payer: Medicare Other | Admitting: Nurse Practitioner

## 2017-09-09 ENCOUNTER — Ambulatory Visit (INDEPENDENT_AMBULATORY_CARE_PROVIDER_SITE_OTHER): Payer: Medicare Other | Admitting: Family Medicine

## 2017-09-09 ENCOUNTER — Other Ambulatory Visit: Payer: Self-pay

## 2017-09-09 ENCOUNTER — Encounter: Payer: Self-pay | Admitting: Family Medicine

## 2017-09-09 VITALS — BP 132/78 | HR 78 | Temp 98.0°F | Resp 16 | Ht 62.6 in | Wt 203.0 lb

## 2017-09-09 DIAGNOSIS — F419 Anxiety disorder, unspecified: Secondary | ICD-10-CM | POA: Diagnosis not present

## 2017-09-09 DIAGNOSIS — K439 Ventral hernia without obstruction or gangrene: Secondary | ICD-10-CM

## 2017-09-09 DIAGNOSIS — R739 Hyperglycemia, unspecified: Secondary | ICD-10-CM

## 2017-09-09 DIAGNOSIS — M353 Polymyalgia rheumatica: Secondary | ICD-10-CM | POA: Diagnosis not present

## 2017-09-09 DIAGNOSIS — E034 Atrophy of thyroid (acquired): Secondary | ICD-10-CM

## 2017-09-09 DIAGNOSIS — F5104 Psychophysiologic insomnia: Secondary | ICD-10-CM | POA: Diagnosis not present

## 2017-09-09 DIAGNOSIS — R29898 Other symptoms and signs involving the musculoskeletal system: Secondary | ICD-10-CM | POA: Diagnosis not present

## 2017-09-09 DIAGNOSIS — E785 Hyperlipidemia, unspecified: Secondary | ICD-10-CM | POA: Diagnosis not present

## 2017-09-09 DIAGNOSIS — K863 Pseudocyst of pancreas: Secondary | ICD-10-CM

## 2017-09-09 DIAGNOSIS — I5022 Chronic systolic (congestive) heart failure: Secondary | ICD-10-CM | POA: Diagnosis not present

## 2017-09-09 DIAGNOSIS — F329 Major depressive disorder, single episode, unspecified: Secondary | ICD-10-CM

## 2017-09-09 DIAGNOSIS — I10 Essential (primary) hypertension: Secondary | ICD-10-CM

## 2017-09-09 DIAGNOSIS — Z6836 Body mass index (BMI) 36.0-36.9, adult: Secondary | ICD-10-CM

## 2017-09-09 MED ORDER — MIRTAZAPINE 7.5 MG PO TABS: 7.5000 mg | ORAL_TABLET | Freq: Every day | ORAL | 5 refills | Status: DC

## 2017-09-09 MED ORDER — CITALOPRAM HYDROBROMIDE 20 MG PO TABS: 20.0000 mg | ORAL_TABLET | Freq: Every day | ORAL | 1 refills | Status: DC

## 2017-09-09 NOTE — Progress Notes (Signed)
Subjective:    Patient ID: Teresa [name], female    DOB: 01/10/1941, 77 y.o.   MRN: [id number]  09/09/2017  Chronic Conditions (6 month follow-up )    HPI This 77 y.o. female presents for nine month follow-up of hypertension, hypothyroidism, glucose intolerance, depression/anxiety, PMR.   Management changes made at last visit included:  -improving diarrhea; stool studies negative; continue to monitor and eat a bland diet.  Increase Protonix to one daily while taking Prednisone.  Continue probiotic therapy. -PMR improving and intolerant ot Prednisone so has decreased dose successfully.  Followed by rheumatology. -depression improving and becoming more active; encourage increased activity for patient. -suffering with fatigue; obtain labs; encourage increased activity.  Update since last visit includes the following PMR: finished Prednisone therapy.  Arthralgias are coming back.  Good range of motion of shoulders now; still having some pain with ROM.   Alopecia: lost hair everywhere; scalp hair is brittle and thin.  Sciatica R: burning and poking pain; attending pain management;  Goes every two months.  Taking hydrocodone as needed. Cannot walk very far due to lower back pain; abdominal hernia puts weight on lower back; limits activity.  Isolating/anxiety/depression: staying sad and anxious; afraid of death.  Living son, [name]; also daughter with cancer thus cannot see pateint because [place].  Taking Citalopram at bedtime; helps fall asleep well.  Thinks about mother, deceased sisters.  Reflects in the past only.  Unable to walk places unless gets out of the car.  Unable to walk from parking deck to front door.  Feels like going to fall.  Son would like a wheelchair. Gets horrible anxiety. Plays on computer.  Watches TV in evenings.  Getting scared of driving yet no MVAs; once drives starting does well.  Thinking about driving causes anxiety. Previous Zoloft.  Hypothyroidism:  Patient reports good compliance with medication, good tolerance to medication, and good symptom control.     BP Readings from Last 3 Encounters:  09/14/17 (!) 160/90  09/09/17 132/78  12/31/16 130/80   Wt Readings from Last 3 Encounters:  09/14/17 207 lb (93.9 kg)  09/09/17 203 lb (92.1 kg)  12/31/16 202 lb 8.5 oz (91.9 kg)   Immunization History  Administered Date(s) Administered  . Influenza,inj,Quad PF,6+ Mos 04/20/2013, 02/26/2016  . Pneumococcal Conjugate-13 03/01/2014  . Pneumococcal-Unspecified 06/16/2010    Review of Systems  Constitutional: Negative for activity change, appetite change, chills, diaphoresis, fatigue, fever and unexpected weight change.  HENT: Negative for congestion, dental problem, drooling, ear discharge, ear pain, facial swelling, hearing loss, mouth sores, nosebleeds, postnasal drip, rhinorrhea, sinus pressure, sneezing, sore throat, tinnitus, trouble swallowing and voice change.   Eyes: Negative for photophobia, pain, discharge, redness, itching and visual disturbance.  Respiratory: Negative for apnea, cough, choking, chest tightness, shortness of breath, wheezing and stridor.   Cardiovascular: Negative for chest pain, palpitations and leg swelling.  Gastrointestinal: Negative for abdominal distention, abdominal pain, anal bleeding, blood in stool, constipation, diarrhea, nausea, rectal pain and vomiting.  Endocrine: Negative for cold intolerance, heat intolerance, polydipsia, polyphagia and polyuria.  Genitourinary: Negative for decreased urine volume, difficulty urinating, dyspareunia, dysuria, enuresis, flank pain, frequency, genital sores, hematuria, menstrual problem, pelvic pain, urgency, vaginal bleeding, vaginal discharge and vaginal pain.  Musculoskeletal: Positive for back pain. Negative for arthralgias, gait problem, joint swelling, myalgias, neck pain and neck stiffness.  Skin: Negative for color change, pallor, rash and wound.    Allergic/Immunologic: Negative for environmental allergies, food allergies and immunocompromised  state.  Neurological: Negative for dizziness, tremors, seizures, syncope, facial asymmetry, speech difficulty, weakness, light-headedness, numbness and headaches.  Hematological: Negative for adenopathy. Does not bruise/bleed easily.  Psychiatric/Behavioral: Positive for dysphoric mood and sleep disturbance. Negative for agitation, behavioral problems, confusion, decreased concentration, hallucinations, self-injury and suicidal ideas. The patient is nervous/anxious. The patient is not hyperactive.     Past Medical History:  Diagnosis Date  . Abdominal pain   . Anxiety    maintained on Xanax tid for years.  . Arthritis    FEET, KNEES, HANDS  . Chronic systolic CHF (congestive heart failure), NYHA class 2 ([place])    EF 25% 2011 MV,  50-55% echo 6/13  . Diabetes mellitus type 2, diet-controlled ([place])   . GERD (gastroesophageal reflux disease)   . Glaucoma    s/p surgery B in 30s.  [place].  . H/O hiatal hernia   . History of glaucoma   . History of small bowel obstruction 2009 AND NOV 2012  . HTN (hypertension)   . Hypothyroidism   . Left ovarian cyst   . Moderate mitral regurgitation   . Moderate tricuspid regurgitation   . Non-healing surgical wound    ABDOMINAL S/P EXPLORATOY LAPAROTOMY 04-18-2011  . Nonischemic cardiomyopathy ([place]) [facility]    EF is 25% per echo February 2012, non ischemic myoview 12/2009.  EF 50% echo 7/12 and plans for ICD cancelled.   . Peptic ulcer disease   . PVC (premature ventricular contraction)   . Shortness of breath   . Spinal stenosis   . Urge urinary incontinence   . Valvular heart disease    Moderate to severe MR, moderate TR, LAE per echo 7/11  . Wears dentures    upper denture-lower partial   Past Surgical History:  Procedure Laterality Date  . ABDOMINAL HERNIA REPAIR  32 YRS AGO   PERITINITIS  . APPENDECTOMY  1979   RUPTURED  . BENIGN RIGHT  BREAST TUMOR REMOVED    . CARDIOVASCULAR STRESS TEST  JULY 2011-  DR HOCHREIN   NO ISCHEMIA  . EXPLORATOMY LAP. / EXTENSIVE LYSIS ADHESIONS/ SMALL BOWEL RESECTION X2 WITH PRIMARY ANASTOMOSIS X2  04-18-2011   SMALL BOWEL OBSTRUCTION  . GLAUCOMA SURGERY  1978  . HERNIA REPAIR    . INCISION AND DRAINAGE OF WOUND N/A 09/13/2012   Procedure: INCISION  AND DEBRIDEMENT WOUND abdominal wall ;  Surgeon: Rolm Bookbinder, MD;  Location: [place];  Service: General;  Laterality: N/A;  coordination with Dr Migdalia Dk   . KNEE ARTHROSCOPY W/ MENISCECTOMY  06-05-2004  . THYROIDECTOMY  1978 GOITOR   AND CERVICAL COLD KNIFE CONE BX  . TONSILLECTOMY  AGE 51  . TRANSTHORACIC ECHOCARDIOGRAM  12-27-2010   EF 45-50%/ MODERATE MV REGURG. / LEFT ATRIUM MILDLY DILATED/ MILD TRICUSPID REGURG.  . TUBAL LIGATION    . VENTRAL HERNIA REPAIR  X4  LAST ONE 20 YRS AGO   ABDOMINAL --  EACH ONE IN DIFFERENT AREA  . VENTRAL HERNIA REPAIR  01/29/2012   Procedure: HERNIA REPAIR VENTRAL ADULT;  Surgeon: Theodoro Kos, DO;  Location: [place];  Service: Plastics;  Laterality: N/A;  . WOUND DEBRIDEMENT  09/25/2011   Procedure: DEBRIDEMENT CLOSURE/ABDOMINAL WOUND;  Surgeon: Theodoro Kos, DO;  Location: [place];  Service: Plastics;  Laterality: N/A;  excision of abdominal wound with primary closure   Allergies  Allergen Reactions  . Aspirin Other (See Comments)    HX Bleeding Ulcer   . Nsaids  Hx of bleeding ulcers  . Tolmetin Rash    Hx of bleeding ulcers   Current Outpatient Medications on File Prior to Visit  Medication Sig Dispense Refill  . carvedilol (COREG) 12.5 MG tablet TAKE 1 TABLET TWICE DAILY WITH A MEAL. 60 tablet 8  . furosemide (LASIX) 40 MG tablet Take 1 tablet (40 mg total) by mouth as needed for fluid or edema. 30 tablet 11  . HYDROcodone-acetaminophen (NORCO/VICODIN) 5-325 MG tablet Take 1 tablet by mouth every 6 (six) hours as needed for moderate pain (back).    .  losartan (COZAAR) 25 MG tablet TAKE 1 TABLET DAILY. 30 tablet 8  . PROAIR HFA 108 (90 Base) MCG/ACT inhaler USE 2 PUFFS EVERY 6 HOURS AS NEEDED FOR SHORTNESS OF BREATH AND WHEEZING. 8.5 g 0  . Probiotic Product (PRO-BIOTIC BLEND PO) Take 1 tablet by mouth daily.    Marland Kitchen Spacer/Aero-Holding Chambers (AEROCHAMBER MV) inhaler Use as instructed 1 each 0  . spironolactone (ALDACTONE) 25 MG tablet TAKE 1/2 TABLET AT BEDTIME. 30 tablet 8   No current facility-administered medications on file prior to visit.    Social History   Socioeconomic History  . Marital status: Widowed    Spouse name: Not on file  . Number of children: 2  . Years of education: Not on file  . Highest education level: Not on file  Occupational History  . Occupation: RETIRED  Social Needs  . Financial resource strain: Not on file  . Food insecurity:    Worry: Not on file    Inability: Not on file  . Transportation needs:    Medical: Not on file    Non-medical: Not on file  Tobacco Use  . Smoking status: Former Smoker    Packs/day: 1.50    Years: 45.00    Pack years: 67.50    Types: Cigarettes    Start date: 11/14/1964    Last attempt to quit: 09/22/2010    Years since quitting: 7.1  . Smokeless tobacco: Never Used  . Tobacco comment: quit 09/25/09 or 09-26-2010 - Didn't smoke during pregnancies  Substance and Sexual Activity  . Alcohol use: No  . Drug use: No  . Sexual activity: Not Currently  Lifestyle  . Physical activity:    Days per week: Not on file    Minutes per session: Not on file  . Stress: Not on file  Relationships  . Social connections:    Talks on phone: Not on file    Gets together: Not on file    Attends religious service: Not on file    Active member of club or organization: Not on file    Attends meetings of clubs or organizations: Not on file    Relationship status: Not on file  . Intimate partner violence:    Fear of current or ex partner: Not on file    Emotionally abused: Not on file     Physically abused: Not on file    Forced sexual activity: Not on file  Other Topics Concern  . Not on file  Social History Narrative   Marital status:  Widowed as of September 26, 2010; not dating.        Children: four children (1 daughter in [place], 1 daughter in [place] estranged; 1 son in [place]; 1 son deceased in 09/26/14); six grandchildren; 5 gg.      Lives: alone with a yorkie.  Son/Harry lives close.        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      Tobacco:  None; quit in 2012.        Alcohol: none      Drugs:none      Exercise:  No formal exercise program.  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  Cannot swim.        ADLs:  Driving; cleans own house; does grocery shopping; pays bills; does not use cane for ambulation.  Drives.        Advanced Directives: no living will; no HCPOA.  DNR/DNI.        [place] [facility]:   Originally from [facility]. Has always lived in [place]. She moved to [place], [place]. 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 Has a dog currently. No bird or mold exposure.    Family History  Problem Relation Age of Onset  . Stroke Brother   . Diabetes Brother   . Tuberculosis Father   . Stroke Mother   . Stroke Sister   . Heart disease Sister   . Emphysema Sister   . Diabetes Daughter        Objective:    BP 132/78   Pulse 78   Temp 98 F (36.7 C) (Oral)   Resp 16   Ht 5' 2.6" (1.59 m)   Wt 203 lb (92.1 kg)   SpO2 96%   BMI 36.42 kg/m  Physical Exam  Constitutional: She is oriented to person, place, and time. She appears well-developed and well-nourished. No distress.  HENT:  Head: Normocephalic and atraumatic.  Right Ear: Hearing, tympanic membrane, external ear and ear canal normal.  Left Ear: Hearing, tympanic membrane, external ear and ear canal normal.  Nose: Nose normal.  Mouth/Throat: Oropharynx is clear and moist.  Eyes: Pupils are equal, round, and reactive to light. Conjunctivae  and EOM are normal.  Neck: Normal range of motion and full passive range of motion without pain. Neck supple. No JVD present. Carotid bruit is not present. No thyromegaly present.  Cardiovascular: Normal rate, regular rhythm, normal heart sounds and intact distal pulses. Exam reveals no gallop and no friction rub.  No murmur heard. Pulmonary/Chest: Effort normal and breath sounds normal. No respiratory distress. She has no wheezes. She has no rales. Right breast exhibits no inverted nipple, no mass, no nipple discharge, no skin change and no tenderness. Left breast exhibits no inverted nipple, no mass, no nipple discharge, no skin change and no tenderness. Breasts are symmetrical.  Abdominal: Soft. Bowel sounds are normal. She exhibits no distension and no mass. There is no tenderness. There is no rebound and no guarding.  Musculoskeletal:       Right shoulder: Normal.       Left shoulder: Normal.       Cervical back: Normal.  Lymphadenopathy:    She has no cervical adenopathy.  Neurological: She is alert and oriented to person, place, and time. She has normal reflexes. No cranial nerve deficit. She exhibits normal muscle tone. Coordination normal.  Skin: Skin is warm and dry. No rash noted. She is not diaphoretic. No erythema. No pallor.  Psychiatric: She has a normal mood and affect. Her behavior is normal. Judgment and thought content normal.  Nursing note and vitals reviewed.  No results found. Depression screen [facility] 2/9 09/09/2017 12/23/2016 09/16/2016 08/19/2016 06/18/2016  Decreased Interest 0 1 0 0 0  Down, Depressed, Hopeless 0 1 0 0 0  PHQ - 2 Score 0 2 0 0 0  Altered sleeping - 1 - - -  Tired, decreased energy - 1 - - -  Change in appetite - 1 - - -  Feeling bad or failure about yourself  - 1 - - -  Trouble concentrating - 1 - - -  Moving slowly or fidgety/restless - 0 - - -  Suicidal thoughts - 0 - - -  PHQ-9 Score - 7 - - -  Some recent data might be hidden   Fall Risk  09/09/2017  12/23/2016 09/16/2016 08/19/2016 06/18/2016  Falls in the past year? No No No No No  Number falls in past yr: - - - - -  Injury with Fall? - - - - -        Assessment & Plan:   1. Essential (primary) hypertension   2. Hypothyroidism due to acquired atrophy of thyroid   3. Dyslipidemia   4. Chronic systolic CHF (congestive heart failure), NYHA class 2 ([place])   5. Hyperglycemia   6. Abdominal wall hernia   7. Anxiety and depression   8. Polymyalgia rheumatica ([place])   9. Psychophysiological insomnia   10. Muscular deconditioning   11. Pancreatic pseudocyst   12. Class 2 severe obesity due to excess calories with serious comorbidity and body mass index (BMI) of 36.0 to 36.9 in adult [facility])     Anxiety and depression: Uncontrolled.  Add mirtazapine 7.5 mg at bedtime for insomnia and anxiety.  Refill of citalopram 20 mg daily provided.  Consider addition of BuSpar for anxiety if warranted.  Muscular deconditioning: Worsening.  Secondary to isolation and anxiety with depression that is uncontrolled.  Refer to home health for a full home evaluation to determine needs.  Will likely warrant physical therapy as well as potentially occupational therapy.  Having difficulties ambulating outside of the home due to deconditioning.  High fall risk.  Polymyalgia rheumatica: Chronic with recent improvement over the past year.  Followed by rheumatology.  Starting to have large joint arthralgias again.  Recommend close follow-up with rheumatology.  Hypothyroidism: Controlled.  Obtain labs for chronic disease management.  Continue current medication.  Abdominal wall hernia: Stable at this time.  Monitor closely for acute pain.  Hyperglycemia: Chronic.  Monitor glucose with hemoglobin A1c every 6 months.  Recommend a low sugar, low carbohydrate diet.  Chronic systolic heart failure: Stable at this time.  Euvolemic.  Continue current medications.  Recommend annual follow-up with cardiology.  Pancreatic  pseudocyst: Stable.  Will warrant repeat MRI of the abdomen in March 2020.  Obesity: BMI 36.  Recommend exercise, weight loss, low-fat and low calorie food choices.  Limit calorie intake to 1200 kcal/day.  Goal protein of 60 g/day.  Prolonged face-to-face for 40 minutes with greater than 50% of time dedicated to counseling and coordination of care.  Orders Placed This Encounter  Procedures  . CBC with Differential/Platelet  . Comprehensive metabolic panel    Order Specific Question:   Has the patient fasted?    Answer:   No  . Lipid panel    Order Specific Question:   Has the patient fasted?    Answer:   No  . TSH  . Hemoglobin A1c  . Ambulatory referral to Home Health    Referral Priority:   Routine    Referral Type:   Home Health Care    Referral Reason:   Specialty Services Required    Requested Specialty:   [place]    Number of Visits Requested:   1  Meds ordered this encounter  Medications  . mirtazapine (REMERON) 7.5 MG tablet    Sig: Take 1 tablet (7.5 mg total) by mouth at bedtime.    Dispense:  30 tablet    Refill:  5  . citalopram (CELEXA) 20 MG tablet    Sig: Take 1 tablet (20 mg total) by mouth daily.    Dispense:  90 tablet    Refill:  1  . levothyroxine (SYNTHROID, LEVOTHROID) 75 MCG tablet    Sig: TAKE 1 TABLET ONCE DAILY BEFORE BREAKFAST.    Dispense:  30 tablet    Refill:  11    Return in about 3 months (around 12/10/2017) for recheck depression, deconditioning.   Aneshia Jacquet Elayne Guerin, M.D. Primary [facility] at [facility] [address] [place], [place]  [address] [phone] phone [phone] fax

## 2017-09-09 NOTE — Patient Instructions (Addendum)
   START MIRTAZAPINE 7.5MG  ONE AT BEDTIME FOR INSOMNIA, DEPRESSION.  CONTINUE CITALOPRAM 20MG  AT BEDTIME.  IF you received an x-ray today, you will receive an invoice from [facility]. Please contact [facility] at [phone] with questions or concerns regarding your invoice.   IF you received labwork today, you will receive an invoice from [facility]. Please contact [facility] at [phone] with questions or concerns regarding your invoice.   Our billing staff will not be able to assist you with questions regarding bills from these companies.  You will be contacted with the lab results as soon as they are available. The fastest way to get your results is to activate your My Chart account. Instructions are located on the last page of this paperwork. If you have not heard from [place] regarding the results in 2 weeks, please contact this office.

## 2017-09-10 LAB — CBC WITH DIFFERENTIAL/PLATELET
BASOS ABS: 0.1 10*3/uL (ref 0.0–0.2)
BASOS: 1 %
EOS (ABSOLUTE): 0.1 10*3/uL (ref 0.0–0.4)
Eos: 2 %
Hematocrit: 38.9 % (ref 34.0–46.6)
Hemoglobin: 12.8 g/dL (ref 11.1–15.9)
IMMATURE GRANS (ABS): 0 10*3/uL (ref 0.0–0.1)
IMMATURE GRANULOCYTES: 1 %
LYMPHS: 30 %
Lymphocytes Absolute: 1.8 10*3/uL (ref 0.7–3.1)
MCH: 31.8 pg (ref 26.6–33.0)
MCHC: 32.9 g/dL (ref 31.5–35.7)
MCV: 97 fL (ref 79–97)
Monocytes Absolute: 0.5 10*3/uL (ref 0.1–0.9)
Monocytes: 9 %
NEUTROS PCT: 57 %
Neutrophils Absolute: 3.5 10*3/uL (ref 1.4–7.0)
PLATELETS: 217 10*3/uL (ref 150–379)
RBC: 4.03 x10E6/uL (ref 3.77–5.28)
RDW: 13.7 % (ref 12.3–15.4)
WBC: 6 10*3/uL (ref 3.4–10.8)

## 2017-09-10 LAB — COMPREHENSIVE METABOLIC PANEL
ALT: 13 IU/L (ref 0–32)
AST: 16 IU/L (ref 0–40)
Albumin/Globulin Ratio: 1.9 (ref 1.2–2.2)
Albumin: 4.4 g/dL (ref 3.5–4.8)
Alkaline Phosphatase: 92 IU/L (ref 39–117)
BUN/Creatinine Ratio: 20 (ref 12–28)
BUN: 21 mg/dL (ref 8–27)
Bilirubin Total: 0.5 mg/dL (ref 0.0–1.2)
CALCIUM: 9.4 mg/dL (ref 8.7–10.3)
CO2: 18 mmol/L — ABNORMAL LOW (ref 20–29)
Chloride: 104 mmol/L (ref 96–106)
Creatinine, Ser: 1.07 mg/dL — ABNORMAL HIGH (ref 0.57–1.00)
GFR, EST AFRICAN AMERICAN: 58 mL/min/{1.73_m2} — AB (ref >59)
GFR, EST NON AFRICAN AMERICAN: 51 mL/min/{1.73_m2} — AB (ref >59)
GLUCOSE: 95 mg/dL (ref 65–99)
Globulin, Total: 2.3 g/dL (ref 1.5–4.5)
Potassium: 5 mmol/L (ref 3.5–5.2)
Sodium: 139 mmol/L (ref 134–144)
TOTAL PROTEIN: 6.7 g/dL (ref 6.0–8.5)

## 2017-09-10 LAB — LIPID PANEL
CHOL/HDL RATIO: 7.7 ratio — AB (ref 0.0–4.4)
Cholesterol, Total: 247 mg/dL — ABNORMAL HIGH (ref 100–199)
HDL: 32 mg/dL — AB (ref >39)
LDL Calculated: 165 mg/dL — ABNORMAL HIGH (ref 0–99)
Triglycerides: 251 mg/dL — ABNORMAL HIGH (ref 0–149)
VLDL CHOLESTEROL CAL: 50 mg/dL — AB (ref 5–40)

## 2017-09-10 LAB — HEMOGLOBIN A1C
Est. average glucose Bld gHb Est-mCnc: 111 mg/dL
Hgb A1c MFr Bld: 5.5 % (ref 4.8–5.6)

## 2017-09-10 LAB — TSH: TSH: 2.78 u[IU]/mL (ref 0.450–4.500)

## 2017-09-14 ENCOUNTER — Encounter: Payer: Self-pay | Admitting: Nurse Practitioner

## 2017-09-14 ENCOUNTER — Ambulatory Visit (INDEPENDENT_AMBULATORY_CARE_PROVIDER_SITE_OTHER): Payer: Medicare Other | Admitting: Nurse Practitioner

## 2017-09-14 VITALS — BP 160/90 | HR 82 | Ht 62.0 in | Wt 207.0 lb

## 2017-09-14 DIAGNOSIS — I428 Other cardiomyopathies: Secondary | ICD-10-CM | POA: Diagnosis not present

## 2017-09-14 NOTE — Patient Instructions (Addendum)
We will be checking the following labs today - NONE  Medication Instructions:    Continue with your current medicines.     Testing/Procedures To Be Arranged:  N/A  Follow-Up:   See me in 9 months    Other Special Instructions:   N/A    If you need a refill on your cardiac medications before your next appointment, please call your pharmacy.   Call the [employer] office at [phone] if you have any questions, problems or concerns.

## 2017-09-14 NOTE — Progress Notes (Signed)
CARDIOLOGY OFFICE NOTE  Date:  09/14/2017    [name] Date of Birth: Jul 10, 1940 Medical Record #[id number]  PCP:  Wardell Honour, MD  Cardiologist:  [place]   Chief Complaint  Patient presents with  . Cardiomyopathy    Follow up visit - seen for Dr. Percival Spanish    History of Present Illness: ENGLISH TOMER is a 77 y.o. female who presents today for a follow up visit. This is a 9 month check. Seen for Dr. Percival Spanish - but I don't think she has ever seen him - she is a former patient of Dr. Susa Simmonds.   She has a nonischemic CM. Past EF down to 25%. EF improved with medicines. Plans for an ICD in the past were cancelled due to recovery of her EF. Other issues include tobacco abuse, HTN, PVCs, DM, valvular heart disease, spinal stenosis and glaucoma. Had a small bowel obstruction back in November of 2012 and had surgery with delayed wound healing. She does not have any known CAD.   I saw her back in April of 2016 - more weight gain - more short of breath. Updated her echo.This was ok. Referred for PFTs but she did not follow thru.   Seen by me back in May 05, 2015 - her son had died. Lots of grief and stress. More short of breath. Finally got a CXR on her - she stilldid not proceed with PFTs. CXR with carotid calcification - got a doppler which was basically normal.   I saw her back in 2016-05-04 - was not doing well. Lots of anxiety. More shortness of breath. Had never got her breathing test done. It remains very hard for her to come back into this office - her son who has passed was cared for here. Finally got her to see pulmonary - most likely with underlying emphysema. Looks like she was to get a CT and follow back up with Dr. Ashok Cordia - but was not able to follow thru due to extreme bouts of diarrhea. Last seen in July of 2018 by me - she had been diagnosed with PMR - on steroids therapy. Was to see rheumatology - had not been back to pulmonary.   Comes in  today. Here with her good friend - Corporate treasurer. Shacara says she is doing ok. Says "please don't send me for any tests". She rarely leaves her house due to profound anxiety. Breathing is ok. No chest pain. She has had recent labs from her PCP just a few weeks ago. She is now on some antidepressant therapy - seems to be helping - she says she doesn't break down as easily. She has had good outpatient BP control. She is taking her medicines.   Past Medical History:  Diagnosis Date  . Abdominal pain   . Anxiety    maintained on Xanax tid for years.  . Arthritis    FEET, KNEES, HANDS  . Chronic systolic CHF (congestive heart failure), NYHA class 2 ([place])    EF 25% 2011 MV,  50-55% echo 6/13  . Diabetes mellitus type 2, diet-controlled ([place])   . GERD (gastroesophageal reflux disease)   . Glaucoma    s/p surgery B in 30s.  [place].  . H/O hiatal hernia   . History of glaucoma   . History of small bowel obstruction 2009 AND NOV 2012  . HTN (hypertension)   . Hypothyroidism   . Left ovarian cyst   . Moderate mitral  regurgitation   . Moderate tricuspid regurgitation   . Non-healing surgical wound    ABDOMINAL S/P EXPLORATOY LAPAROTOMY 04-18-2011  . Nonischemic cardiomyopathy ([place]) DR [facility]    EF is 25% per echo February 2012, non ischemic myoview 12/2009.  EF 50% echo 7/12 and plans for ICD cancelled.   . Peptic ulcer disease   . PVC (premature ventricular contraction)   . Shortness of breath   . Spinal stenosis   . Urge urinary incontinence   . Valvular heart disease    Moderate to severe MR, moderate TR, LAE per echo 7/11  . Wears dentures    upper denture-lower partial    Past Surgical History:  Procedure Laterality Date  . ABDOMINAL HERNIA REPAIR  32 YRS AGO   PERITINITIS  . APPENDECTOMY  1979   RUPTURED  . BENIGN RIGHT BREAST TUMOR REMOVED    . CARDIOVASCULAR STRESS TEST  JULY 2011-  DR HOCHREIN   NO ISCHEMIA  . EXPLORATOMY LAP. / EXTENSIVE LYSIS ADHESIONS/ SMALL BOWEL RESECTION X2  WITH PRIMARY ANASTOMOSIS X2  04-18-2011   SMALL BOWEL OBSTRUCTION  . GLAUCOMA SURGERY  1978  . HERNIA REPAIR    . INCISION AND DRAINAGE OF WOUND N/A 09/13/2012   Procedure: INCISION  AND DEBRIDEMENT WOUND abdominal wall ;  Surgeon: Rolm Bookbinder, MD;  Location: [place];  Service: General;  Laterality: N/A;  coordination with Dr Migdalia Dk   . KNEE ARTHROSCOPY W/ MENISCECTOMY  06-05-2004  . THYROIDECTOMY  1978 GOITOR   AND CERVICAL COLD KNIFE CONE BX  . TONSILLECTOMY  AGE 25  . TRANSTHORACIC ECHOCARDIOGRAM  12-27-2010   EF 45-50%/ MODERATE MV REGURG. / LEFT ATRIUM MILDLY DILATED/ MILD TRICUSPID REGURG.  . TUBAL LIGATION    . VENTRAL HERNIA REPAIR  X4  LAST ONE 20 YRS AGO   ABDOMINAL --  EACH ONE IN DIFFERENT AREA  . VENTRAL HERNIA REPAIR  01/29/2012   Procedure: HERNIA REPAIR VENTRAL ADULT;  Surgeon: Theodoro Kos, DO;  Location: [place];  Service: Plastics;  Laterality: N/A;  . WOUND DEBRIDEMENT  09/25/2011   Procedure: DEBRIDEMENT CLOSURE/ABDOMINAL WOUND;  Surgeon: Theodoro Kos, DO;  Location: [place];  Service: Plastics;  Laterality: N/A;  excision of abdominal wound with primary closure     Medications: Current Meds  Medication Sig  . carvedilol (COREG) 12.5 MG tablet TAKE 1 TABLET TWICE DAILY WITH A MEAL.  . citalopram (CELEXA) 20 MG tablet Take 1 tablet (20 mg total) by mouth daily.  . furosemide (LASIX) 40 MG tablet Take 1 tablet (40 mg total) by mouth as needed for fluid or edema.  Marland Kitchen HYDROcodone-acetaminophen (NORCO/VICODIN) 5-325 MG tablet Take 1 tablet by mouth every 6 (six) hours as needed for moderate pain (back).  Marland Kitchen levothyroxine (SYNTHROID, LEVOTHROID) 75 MCG tablet TAKE 1 TABLET ONCE DAILY BEFORE BREAKFAST.  Marland Kitchen losartan (COZAAR) 25 MG tablet TAKE 1 TABLET DAILY.  . mirtazapine (REMERON) 7.5 MG tablet Take 1 tablet (7.5 mg total) by mouth at bedtime.  . pantoprazole (PROTONIX) 40 MG tablet TAKE 1 TABLET ONCE DAILY.  Marland Kitchen PROAIR HFA 108 (90  Base) MCG/ACT inhaler USE 2 PUFFS EVERY 6 HOURS AS NEEDED FOR SHORTNESS OF BREATH AND WHEEZING.  . Probiotic Product (PRO-BIOTIC BLEND PO) Take 1 tablet by mouth daily.  Marland Kitchen Spacer/Aero-Holding Chambers (AEROCHAMBER MV) inhaler Use as instructed  . spironolactone (ALDACTONE) 25 MG tablet TAKE 1/2 TABLET AT BEDTIME.     Allergies: Allergies  Allergen Reactions  . Aspirin Other (See  Comments)    HX Bleeding Ulcer   . Nsaids     Hx of bleeding ulcers  . Tolmetin Rash    Hx of bleeding ulcers    Social History: The patient  reports that she quit smoking about 6 years ago. Her smoking use included cigarettes. She started smoking about 52 years ago. She has a 67.50 pack-year smoking history. She has never used smokeless tobacco. She reports that she does not drink alcohol or use drugs.   Family History: The patient's family history includes Diabetes in her brother and daughter; Emphysema in her sister; Heart disease in her sister; Stroke in her brother, mother, and sister; Tuberculosis in her father.   Review of Systems: Please see the history of present illness.   Otherwise, the review of systems is positive for none.   All other systems are reviewed and negative.   Physical Exam: VS:  BP (!) 160/90 (BP Location: Left Arm, Patient Position: Sitting, Cuff Size: Large)   Pulse 82   Ht 5\' 2"  (1.575 m)   Wt 207 lb (93.9 kg)   SpO2 97% Comment: at rest  BMI 37.86 kg/m  .  BMI Body mass index is 37.86 kg/m.  Wt Readings from Last 3 Encounters:  09/14/17 207 lb (93.9 kg)  09/09/17 203 lb (92.1 kg)  12/31/16 202 lb 8.5 oz (91.9 kg)    General: Pleasant. Chronically ill. Alert and in no acute distress.   HEENT: Normal.  Neck: Supple, no JVD, carotid bruits, or masses noted.  Cardiac: Regular rate and rhythm. No murmurs, rubs, or gallops. No edema.  Respiratory:  Lungs are clear to auscultation bilaterally with normal work of breathing.  GI: Soft and nontender.  MS: No deformity or  atrophy. Gait and ROM intact. She is using a cane.  Skin: Warm and dry. Color is normal.  Neuro:  Strength and sensation are intact and no gross focal deficits noted.  Psych: Alert, appropriate and with normal affect.   LABORATORY DATA:  EKG:  EKG is not ordered today.  Lab Results  Component Value Date   WBC 6.0 09/09/2017   HGB 12.8 09/09/2017   HCT 38.9 09/09/2017   PLT 217 09/09/2017   GLUCOSE 95 09/09/2017   CHOL 247 (H) 09/09/2017   TRIG 251 (H) 09/09/2017   HDL 32 (L) 09/09/2017   LDLDIRECT 182.5 09/12/2010   LDLCALC 165 (H) 09/09/2017   ALT 13 09/09/2017   AST 16 09/09/2017   NA 139 09/09/2017   K 5.0 09/09/2017   CL 104 09/09/2017   CREATININE 1.07 (H) 09/09/2017   BUN 21 09/09/2017   CO2 18 (L) 09/09/2017   TSH 2.780 09/09/2017   INR 1.08 02/04/2013   HGBA1C 5.5 09/09/2017   MICROALBUR 0.4 02/26/2016     BNP (last 3 results) No results for input(s): BNP in the last 8760 hours.  ProBNP (last 3 results) No results for input(s): PROBNP in the last 8760 hours.   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  Follow-up MRI abdomen with/ without contrast is suggested in 12 months.  This recommendation follows ACR consensus guidelines: Managing Incidental Findings on Abdominal CT: White Paper of the ACR Incidental Findings Committee. J Am Coll Radiol 2010;7:754-773.  Electronically Signed  By: Julian Hy M.D.  On: 09/18/2015 12:36   Echo Study Conclusions from 09/2014  -  Left ventricle: Posterior basal hypokinesis. Wall thickness was increased in a pattern of mild LVH. Systolic function was normal. The estimated ejection fraction was in the range of 50% to 55%. - Aortic valve: There was mild regurgitation. - Mitral valve: There was mild regurgitation. - Left atrium: The  atrium was mildly dilated. - Atrial septum: No defect or patent foramen ovale was identified. - Pericardium, extracardiac: A trivial pericardial effusion was identified posterior to the heart  Assessment/Plan:  1. Nonischemic CM - herecho was ok from 2016. She looks stable. I think her weight gain is more a reflection of being sedentary/on steroid therapy, etc. Her other medical issues continue to be more pressing. But overall, she seems to be holding her own.   2. Dyspnea/Tobacco abuse - felt to have emphysema - she is using an inhaler. No real dyspnea noted - I suspect this is due to being less active.  3. Obesity - very limiting for her.   4. Fatigue - TSH followed by PCP - recent check ok.   5. HLD - recent labs noted.   6. Chronic diarrhea - this has resolved.   7. PMR - was on steroid therapy. Now on pain medicine.    Current medicines are reviewed with the patient today.  The patient does not have concerns regarding medicines other than what has been noted above.  The following changes have been made:  See above.  Labs/ tests ordered today include:   No orders of the defined types were placed in this encounter.    Disposition:   FU with me in about 9 months. Her friend Eustaquio Maize is quite instrumental in her care and trying to get her out more.   Patient is agreeable to this plan and will call if any problems develop in the interim.   SignedTruitt Merle, NP  09/14/2017 3:50 PM  [facility] [address] [place], [place]  [address] Phone: [phone] Fax: [phone]

## 2017-09-18 ENCOUNTER — Telehealth: Payer: Self-pay | Admitting: Family Medicine

## 2017-09-18 NOTE — Telephone Encounter (Signed)
Copied from [place] [phone]. Topic: General - Other >> Sep 18, 2017  1:58 PM Margot Ables wrote: Dr. Tamala Julian referred to start PT. Ms. Diekmann is wanting to think about it so they have not seen pt. They will contact her again next Wednesday 09/23/17 and see if pt is willing to see [place].

## 2017-09-25 ENCOUNTER — Telehealth: Payer: Self-pay | Admitting: Family Medicine

## 2017-09-25 NOTE — Telephone Encounter (Signed)
Sent referral to [facility] for pt. They sent a message to referrals yesterday stating that they had a nurse come out, but that the pt refused services when they got there.

## 2017-09-28 NOTE — Telephone Encounter (Signed)
Spoke with pt today to follow-up and pt informed me that  she has been having trouble still after last OV with her anxiety and Depression. Pt. States when the PT came to her home she just was not well enough for them to help her and did not want to do PT at that time. Pt also states she has episodes of fear and feeling ashamed. Informed pt that I would forward message to Dr. Tamala Julian and see if there is anything else she may recommend.

## 2017-09-28 NOTE — Telephone Encounter (Signed)
Please call patient to follow-up.  Why did she refuse home health services?

## 2017-10-01 MED ORDER — BUSPIRONE HCL 5 MG PO TABS: 5.0000 mg | ORAL_TABLET | Freq: Two times a day (BID) | ORAL | 5 refills | Status: DC

## 2017-10-01 NOTE — Telephone Encounter (Signed)
Call --- 1. I have sent in buspirone/Buspar for anxiety; she should take this in addition to the Citalopram and Mirtazipine. 2. I also recommend counseling for patient; is she agreeable?

## 2017-10-02 NOTE — Telephone Encounter (Signed)
Spoke with pt.  Gave message re: new med, counseling.    Pt states she has transportation issues with counseling.  She will think about it and let us know. She wanted to thank Dr. Tamala Julian for her caring for her!

## 2017-11-05 ENCOUNTER — Other Ambulatory Visit: Payer: Self-pay | Admitting: Family Medicine

## 2017-11-09 DIAGNOSIS — K863 Pseudocyst of pancreas: Secondary | ICD-10-CM | POA: Insufficient documentation

## 2017-11-09 MED ORDER — LEVOTHYROXINE SODIUM 75 MCG PO TABS
ORAL_TABLET | ORAL | 11 refills | Status: DC
Start: 2017-11-09 — End: 2018-11-03

## 2017-11-11 ENCOUNTER — Encounter: Payer: Self-pay | Admitting: Family Medicine

## 2017-12-03 ENCOUNTER — Other Ambulatory Visit: Payer: Self-pay | Admitting: Family Medicine

## 2017-12-11 NOTE — Progress Notes (Deleted)
Subjective:    Patient ID: Teresa [name], female    DOB: [birth year], 77 y.o.   MRN: [id number]  12/16/2017  No chief complaint on file.    HPI This 77 y.o. female presents for evaluation of ***. BP Readings from Last 3 Encounters:  09/14/17 (!) 160/90  09/09/17 132/78  12/31/16 130/80   Wt Readings from Last 3 Encounters:  09/14/17 207 lb (93.9 kg)  09/09/17 203 lb (92.1 kg)  12/31/16 202 lb 8.5 oz (91.9 kg)   Immunization History  Administered Date(s) Administered  . Influenza,inj,Quad PF,6+ Mos 04/20/2013, 02/26/2016  . Pneumococcal Conjugate-13 03/01/2014  . Pneumococcal-Unspecified 06/16/2010    Review of Systems  Past Medical History:  Diagnosis Date  . Abdominal pain   . Anxiety    maintained on Xanax tid for years.  . Arthritis    FEET, KNEES, HANDS  . Chronic systolic CHF (congestive heart failure), NYHA class 2 ([place])    EF 25% 2011 MV,  50-55% echo 6/13  . Diabetes mellitus type 2, diet-controlled ([place])   . GERD (gastroesophageal reflux disease)   . Glaucoma    s/p surgery B in 30s.  [place].  . H/O hiatal hernia   . History of glaucoma   . History of small bowel obstruction 2009 AND NOV 2012  . HTN (hypertension)   . Hypothyroidism   . Left ovarian cyst   . Moderate mitral regurgitation   . Moderate tricuspid regurgitation   . Non-healing surgical wound    ABDOMINAL S/P EXPLORATOY LAPAROTOMY 04-18-2011  . Nonischemic cardiomyopathy ([place]) DR [facility]    EF is 25% per echo February 2012, non ischemic myoview 12/2009.  EF 50% echo 7/12 and plans for ICD cancelled.   . Peptic ulcer disease   . PVC (premature ventricular contraction)   . Shortness of breath   . Spinal stenosis   . Urge urinary incontinence   . Valvular heart disease    Moderate to severe MR, moderate TR, LAE per echo 7/11  . Wears dentures    upper denture-lower partial   Past Surgical History:  Procedure Laterality Date  . ABDOMINAL HERNIA REPAIR  32 YRS AGO   PERITINITIS  .  APPENDECTOMY  1979   RUPTURED  . BENIGN RIGHT BREAST TUMOR REMOVED    . CARDIOVASCULAR STRESS TEST  JULY 2011-  DR HOCHREIN   NO ISCHEMIA  . EXPLORATOMY LAP. / EXTENSIVE LYSIS ADHESIONS/ SMALL BOWEL RESECTION X2 WITH PRIMARY ANASTOMOSIS X2  04-18-2011   SMALL BOWEL OBSTRUCTION  . GLAUCOMA SURGERY  1978  . HERNIA REPAIR    . INCISION AND DRAINAGE OF WOUND N/A 09/13/2012   Procedure: INCISION  AND DEBRIDEMENT WOUND abdominal wall ;  Surgeon: Rolm Bookbinder, MD;  Location: [place];  Service: General;  Laterality: N/A;  coordination with Dr Migdalia Dk   . KNEE ARTHROSCOPY W/ MENISCECTOMY  06-05-2004  . THYROIDECTOMY  1978 GOITOR   AND CERVICAL COLD KNIFE CONE BX  . TONSILLECTOMY  AGE 18  . TRANSTHORACIC ECHOCARDIOGRAM  12-27-2010   EF 45-50%/ MODERATE MV REGURG. / LEFT ATRIUM MILDLY DILATED/ MILD TRICUSPID REGURG.  . TUBAL LIGATION    . VENTRAL HERNIA REPAIR  X4  LAST ONE 20 YRS AGO   ABDOMINAL --  EACH ONE IN DIFFERENT AREA  . VENTRAL HERNIA REPAIR  01/29/2012   Procedure: HERNIA REPAIR VENTRAL ADULT;  Surgeon: Theodoro Kos, DO;  Location: [place];  Service: Plastics;  Laterality: N/A;  . WOUND DEBRIDEMENT  09/25/2011  Procedure: DEBRIDEMENT CLOSURE/ABDOMINAL WOUND;  Surgeon: Theodoro Kos, DO;  Location: [place];  Service: Plastics;  Laterality: N/A;  excision of abdominal wound with primary closure   Allergies  Allergen Reactions  . Aspirin Other (See Comments)    HX Bleeding Ulcer   . Nsaids     Hx of bleeding ulcers  . Tolmetin Rash    Hx of bleeding ulcers   Current Outpatient Medications on File Prior to Visit  Medication Sig Dispense Refill  . busPIRone (BUSPAR) 5 MG tablet Take 1 tablet (5 mg total) by mouth 2 (two) times daily. 60 tablet 5  . carvedilol (COREG) 12.5 MG tablet TAKE 1 TABLET TWICE DAILY WITH A MEAL. 60 tablet 8  . citalopram (CELEXA) 20 MG tablet Take 1 tablet (20 mg total) by mouth daily. 90 tablet 1  . furosemide (LASIX)  40 MG tablet Take 1 tablet (40 mg total) by mouth as needed for fluid or edema. 30 tablet 11  . HYDROcodone-acetaminophen (NORCO/VICODIN) 5-325 MG tablet Take 1 tablet by mouth every 6 (six) hours as needed for moderate pain (back).    Marland Kitchen levothyroxine (SYNTHROID, LEVOTHROID) 75 MCG tablet TAKE 1 TABLET ONCE DAILY BEFORE BREAKFAST. 30 tablet 11  . losartan (COZAAR) 25 MG tablet TAKE 1 TABLET DAILY. 30 tablet 8  . mirtazapine (REMERON) 7.5 MG tablet Take 1 tablet (7.5 mg total) by mouth at bedtime. 30 tablet 5  . pantoprazole (PROTONIX) 40 MG tablet TAKE 1 TABLET ONCE DAILY. 30 tablet 5  . PROAIR HFA 108 (90 Base) MCG/ACT inhaler USE 2 PUFFS EVERY 6 HOURS AS NEEDED FOR SHORTNESS OF BREATH AND WHEEZING. 8.5 g 0  . Probiotic Product (PRO-BIOTIC BLEND PO) Take 1 tablet by mouth daily.    Marland Kitchen Spacer/Aero-Holding Chambers (AEROCHAMBER MV) inhaler Use as instructed 1 each 0  . spironolactone (ALDACTONE) 25 MG tablet TAKE 1/2 TABLET AT BEDTIME. 30 tablet 8   No current facility-administered medications on file prior to visit.    Social History   Socioeconomic History  . Marital status: Widowed    Spouse name: Not on file  . Number of children: 2  . Years of education: Not on file  . Highest education level: Not on file  Occupational History  . Occupation: RETIRED  Social Needs  . Financial resource strain: Not on file  . Food insecurity:    Worry: Not on file    Inability: Not on file  . Transportation needs:    Medical: Not on file    Non-medical: Not on file  Tobacco Use  . Smoking status: Former Smoker    Packs/day: 1.50    Years: 45.00    Pack years: 67.50    Types: Cigarettes    Start date: 11/14/1964    Last attempt to quit: 09/22/2010    Years since quitting: 7.2  . Smokeless tobacco: Never Used  . Tobacco comment: quit 2011 or 2012 - Didn't smoke during pregnancies  Substance and Sexual Activity  . Alcohol use: No  . Drug use: No  . Sexual activity: Not Currently  Lifestyle  .  Physical activity:    Days per week: Not on file    Minutes per session: Not on file  . Stress: Not on file  Relationships  . Social connections:    Talks on phone: Not on file    Gets together: Not on file    Attends religious service: Not on file    Active member of club or  organization: Not on file    Attends meetings of clubs or organizations: Not on file    Relationship status: Not on file  . Intimate partner violence:    Fear of current or ex partner: Not on file    Emotionally abused: Not on file    Physically abused: Not on file    Forced sexual activity: Not on file  Other Topics Concern  . Not on file  Social History Narrative   Marital status:  Widowed as of 2010-09-25; not dating.        Children: four children (1 daughter in [place], 1 daughter in [place] estranged; 1 son in [place]; 1 son deceased in 2014/09/25); six grandchildren; 5 gg.      Lives: alone with a yorkie.  Son/Harry lives close.        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      Tobacco:  None; quit in 09/25/2010.        Alcohol: none      Drugs:none      Exercise:  No formal exercise program.  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  Cannot swim.        ADLs:  Driving; cleans own house; does grocery shopping; pays bills; does not use cane for ambulation.  Drives.        Advanced Directives: no living will; no HCPOA.  DNR/DNI.        [facility]:   Originally from [place]. Has always lived in [place]. She moved to [place], [place]. 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 Has a dog currently. No bird or mold exposure.    Family History  Problem Relation Age of Onset  . Stroke Brother   . Diabetes Brother   . Tuberculosis Father   . Stroke Mother   . Stroke Sister   . Heart disease Sister   . Emphysema Sister   . Diabetes Daughter        Objective:    There were no vitals taken for this visit. Physical Exam No results  found. Depression screen [facility] 2/9 09/09/2017 12/23/2016 09/16/2016 08/19/2016 06/18/2016  Decreased Interest 0 1 0 0 0  Down, Depressed, Hopeless 0 1 0 0 0  PHQ - 2 Score 0 2 0 0 0  Altered sleeping - 1 - - -  Tired, decreased energy - 1 - - -  Change in appetite - 1 - - -  Feeling bad or failure about yourself  - 1 - - -  Trouble concentrating - 1 - - -  Moving slowly or fidgety/restless - 0 - - -  Suicidal thoughts - 0 - - -  PHQ-9 Score - 7 - - -  Some recent data might be hidden   Fall Risk  09/09/2017 12/23/2016 09/16/2016 08/19/2016 06/18/2016  Falls in the past year? No No No No No  Number falls in past yr: - - - - -  Injury with Fall? - - - - -        Assessment & Plan:   1. Chronic systolic CHF (congestive heart failure), NYHA class 2 ([place])   2. Essential (primary) hypertension   3. Hypothyroidism due to acquired atrophy of thyroid   4. Generalized anxiety disorder     ***  No orders of the defined types were placed in this encounter.  No orders of the defined types were placed in this encounter.   No follow-ups on  file.   Norwood Levo, M.D. Primary Care at [facility] previously [facility] [address] [place], [place]  [address] [phone] phone [phone] fax

## 2017-12-16 ENCOUNTER — Ambulatory Visit: Payer: Medicare Other | Admitting: Family Medicine

## 2017-12-21 ENCOUNTER — Ambulatory Visit (INDEPENDENT_AMBULATORY_CARE_PROVIDER_SITE_OTHER): Payer: Medicare Other | Admitting: Family Medicine

## 2017-12-21 ENCOUNTER — Telehealth: Payer: Self-pay | Admitting: Family Medicine

## 2017-12-21 VITALS — BP 160/80 | HR 78 | Ht 62.0 in

## 2017-12-21 DIAGNOSIS — K219 Gastro-esophageal reflux disease without esophagitis: Secondary | ICD-10-CM | POA: Diagnosis not present

## 2017-12-21 DIAGNOSIS — K439 Ventral hernia without obstruction or gangrene: Secondary | ICD-10-CM | POA: Diagnosis not present

## 2017-12-21 DIAGNOSIS — E034 Atrophy of thyroid (acquired): Secondary | ICD-10-CM | POA: Diagnosis not present

## 2017-12-21 DIAGNOSIS — I5022 Chronic systolic (congestive) heart failure: Secondary | ICD-10-CM | POA: Diagnosis not present

## 2017-12-21 DIAGNOSIS — F419 Anxiety disorder, unspecified: Secondary | ICD-10-CM

## 2017-12-21 DIAGNOSIS — F32A Depression, unspecified: Secondary | ICD-10-CM

## 2017-12-21 DIAGNOSIS — F5104 Psychophysiologic insomnia: Secondary | ICD-10-CM

## 2017-12-21 DIAGNOSIS — K863 Pseudocyst of pancreas: Secondary | ICD-10-CM | POA: Diagnosis not present

## 2017-12-21 DIAGNOSIS — R42 Dizziness and giddiness: Secondary | ICD-10-CM | POA: Diagnosis not present

## 2017-12-21 DIAGNOSIS — F329 Major depressive disorder, single episode, unspecified: Secondary | ICD-10-CM

## 2017-12-21 DIAGNOSIS — I10 Essential (primary) hypertension: Secondary | ICD-10-CM | POA: Diagnosis not present

## 2017-12-21 DIAGNOSIS — M353 Polymyalgia rheumatica: Secondary | ICD-10-CM | POA: Diagnosis not present

## 2017-12-21 DIAGNOSIS — R739 Hyperglycemia, unspecified: Secondary | ICD-10-CM | POA: Diagnosis not present

## 2017-12-21 DIAGNOSIS — E7800 Pure hypercholesterolemia, unspecified: Secondary | ICD-10-CM | POA: Diagnosis not present

## 2017-12-21 MED ORDER — FUROSEMIDE 20 MG PO TABS: 20.0000 mg | ORAL_TABLET | Freq: Every day | ORAL | 0 refills | Status: DC | PRN

## 2017-12-21 MED ORDER — FUROSEMIDE 40 MG PO TABS: 40.0000 mg | ORAL_TABLET | Freq: Every day | ORAL | 0 refills | Status: DC | PRN

## 2017-12-21 MED ORDER — MIRTAZAPINE 15 MG PO TABS: 15.0000 mg | ORAL_TABLET | Freq: Every day | ORAL | 1 refills | Status: DC

## 2017-12-21 MED ORDER — CITALOPRAM HYDROBROMIDE 20 MG PO TABS: 20.0000 mg | ORAL_TABLET | Freq: Every day | ORAL | 1 refills | Status: DC

## 2017-12-21 NOTE — Progress Notes (Signed)
Subjective:    Patient ID: [name], female    DOB: 05-06-1941, 77 y.o.   MRN: [id number]  12/21/2017  Dizziness (off/on; only comes while she is sitting still); Medication Refill (anxiety medication); and Hypertension (taking her medication everyday)    HPI This 77 y.o. female presents with son for 30-month follow-up of anxiety and depression, polymyalgia rheumatica, hypertension, hypothyroidism, hypercholesterolemia.  Management changes made last visit include the following:  Anxiety and depression: Uncontrolled.  Add mirtazapine 7.5 mg at bedtime for insomnia and anxiety.  Refill of citalopram 20 mg daily provided.  Consider addition of BuSpar for anxiety if warranted. Muscular deconditioning: Worsening.  Secondary to isolation and anxiety with depression that is uncontrolled.  Refer to home health for a full home evaluation to determine needs.  Will likely warrant physical therapy as well as potentially occupational therapy.  Having difficulties ambulating outside of the home due to deconditioning.  High fall risk. Polymyalgia rheumatica: Chronic with recent improvement over the past year.  Followed by rheumatology.  Starting to have large joint arthralgias again.  Recommend close follow-up with rheumatology. Hypothyroidism: Controlled.  Obtain labs for chronic disease management.  Continue current medication. Abdominal wall hernia: Stable at this time.  Monitor closely for acute pain. Hyperglycemia: Chronic.  Monitor glucose with hemoglobin A1c every 6 months.  Recommend a low sugar, low carbohydrate diet. Chronic systolic heart failure: Stable at this time.  Euvolemic.  Continue current medications.  Recommend annual follow-up with cardiology. Pancreatic pseudocyst: Stable.  Will warrant repeat MRI of the abdomen in March 2020. Obesity: BMI 36.  Recommend exercise, weight loss, low-fat and low calorie food choices.  Limit calorie intake to 1200 kcal/day.  Goal protein of 60  g/day.  Update: Leg swelling is worse for two weeks; no chest pain.  Mild DOE if has to do much; chronic issue.  Does not do much to get breathless.  Drinking soda; also eating chips.  Sedentary; not very active; has gained weight in the past year; denies chest pain, orthopnea, palpitations.  Has not needed furosemide in a year atleast.  Less depressed with addition of Mirtazipine; 75% improved.  Finally the fog has lifted.  Son has noticed a great improvement in modd.    PMR: maintained on Prednisone for 15 months; now off of it.  Having some pain but not enough to start Prednisone.  Pain is worse in morning. Glad has dog to force activity.   Saw cardiology on 09/14/2017.  Lives alone.   Drinking three cans of diet pepsi daily; no other liquids throughout the day. Has suffered with 2 episodes of dizziness in the past 2 days.  The first episode was with bending over to pick something up.  The second episode of dizziness occurred while sitting.  Turning patient's head did not trigger dizziness at rest.  Denies associated acute hearing loss, tinnitus, blurred vision, diplopia.  Denies recent nasal congestion or rhinorrhea.  Does suffer with chronic hearing loss.  Patient is concerned that she has wax or blockage in ears contributing to hearing loss.  Did undergo formal audiometry 2 years ago.  Diagnosed with mild hearing loss but did not warrant hearing aids at that time.  Denies unsteady gait.  Denies addition of new medications or cessation of any medications.  Son expresses concern that patient hardly eats or drinks anything.  Blood pressure is elevated today.  Patient is not checking blood pressure at home.Denies headache.  BP Readings from Last 3 Encounters:  12/21/17 (!) 160/80  09/14/17 (!) 160/90  09/09/17 132/78   Wt Readings from Last 3 Encounters:  09/14/17 207 lb (93.9 kg)  09/09/17 203 lb (92.1 kg)  12/31/16 202 lb 8.5 oz (91.9 kg)   Immunization History  Administered Date(s)  Administered  . Influenza,inj,Quad PF,6+ Mos 04/20/2013, 02/26/2016  . Pneumococcal Conjugate-13 03/01/2014  . Pneumococcal-Unspecified 06/16/2010    Review of Systems  Constitutional: Negative for activity change, appetite change, chills, diaphoresis, fatigue, fever and unexpected weight change.  HENT: Positive for hearing loss. Negative for congestion, dental problem, drooling, ear discharge, ear pain, facial swelling, mouth sores, nosebleeds, postnasal drip, rhinorrhea, sinus pressure, sinus pain, sneezing, sore throat, tinnitus, trouble swallowing and voice change.   Eyes: Negative for photophobia, pain, discharge, redness, itching and visual disturbance.  Respiratory: Negative for apnea, cough, choking, chest tightness, shortness of breath, wheezing and stridor.   Cardiovascular: Negative for chest pain, palpitations and leg swelling.  Gastrointestinal: Negative for abdominal distention, abdominal pain, anal bleeding, blood in stool, constipation, diarrhea, nausea, rectal pain and vomiting.  Endocrine: Negative for cold intolerance, heat intolerance, polydipsia, polyphagia and polyuria.  Genitourinary: Negative for decreased urine volume, difficulty urinating, dyspareunia, dysuria, enuresis, flank pain, frequency, genital sores, hematuria, menstrual problem, pelvic pain, urgency, vaginal bleeding, vaginal discharge and vaginal pain.  Musculoskeletal: Positive for arthralgias, back pain and gait problem. Negative for joint swelling, myalgias, neck pain and neck stiffness.  Skin: Negative for color change, pallor, rash and wound.  Allergic/Immunologic: Negative for environmental allergies, food allergies and immunocompromised state.  Neurological: Positive for dizziness. Negative for tremors, seizures, syncope, facial asymmetry, speech difficulty, weakness, light-headedness, numbness and headaches.  Hematological: Negative for adenopathy. Does not bruise/bleed easily.  Psychiatric/Behavioral:  Positive for dysphoric mood. Negative for agitation, behavioral problems, confusion, decreased concentration, hallucinations, self-injury, sleep disturbance and suicidal ideas. The patient is not nervous/anxious and is not hyperactive.     Past Medical History:  Diagnosis Date  . Abdominal pain   . Anxiety    maintained on Xanax tid for years.  . Arthritis    FEET, KNEES, HANDS  . Chronic systolic CHF (congestive heart failure), NYHA class 2 ([place])    EF 25% 2011 MV,  50-55% echo 6/13  . Diabetes mellitus type 2, diet-controlled ([place])   . GERD (gastroesophageal reflux disease)   . Glaucoma    s/p surgery B in 30s.  [place].  . H/O hiatal hernia   . History of glaucoma   . History of small bowel obstruction 2009 AND NOV 2012  . HTN (hypertension)   . Hypothyroidism   . Left ovarian cyst   . Moderate mitral regurgitation   . Moderate tricuspid regurgitation   . Non-healing surgical wound    ABDOMINAL S/P EXPLORATOY LAPAROTOMY 04-18-2011  . Nonischemic cardiomyopathy ([place]) DR [facility]    EF is 25% per echo February 2012, non ischemic myoview 12/2009.  EF 50% echo 7/12 and plans for ICD cancelled.   . Peptic ulcer disease   . PVC (premature ventricular contraction)   . Shortness of breath   . Spinal stenosis   . Urge urinary incontinence   . Valvular heart disease    Moderate to severe MR, moderate TR, LAE per echo 7/11  . Wears dentures    upper denture-lower partial   Past Surgical History:  Procedure Laterality Date  . ABDOMINAL HERNIA REPAIR  32 YRS AGO   PERITINITIS  . APPENDECTOMY  1979   RUPTURED  . BENIGN RIGHT BREAST TUMOR REMOVED    .  CARDIOVASCULAR STRESS TEST  JULY 2011-  DR HOCHREIN   NO ISCHEMIA  . EXPLORATOMY LAP. / EXTENSIVE LYSIS ADHESIONS/ SMALL BOWEL RESECTION X2 WITH PRIMARY ANASTOMOSIS X2  04-18-2011   SMALL BOWEL OBSTRUCTION  . GLAUCOMA SURGERY  1978  . HERNIA REPAIR    . INCISION AND DRAINAGE OF WOUND N/A 09/13/2012   Procedure: INCISION  AND  DEBRIDEMENT WOUND abdominal wall ;  Surgeon: Rolm Bookbinder, MD;  Location: [facility];  Service: General;  Laterality: N/A;  coordination with Dr Migdalia Dk   . KNEE ARTHROSCOPY W/ MENISCECTOMY  06-05-2004  . THYROIDECTOMY  1978 GOITOR   AND CERVICAL COLD KNIFE CONE BX  . TONSILLECTOMY  AGE 50  . TRANSTHORACIC ECHOCARDIOGRAM  12-27-2010   EF 45-50%/ MODERATE MV REGURG. / LEFT ATRIUM MILDLY DILATED/ MILD TRICUSPID REGURG.  . TUBAL LIGATION    . VENTRAL HERNIA REPAIR  X4  LAST ONE 20 YRS AGO   ABDOMINAL --  EACH ONE IN DIFFERENT AREA  . VENTRAL HERNIA REPAIR  01/29/2012   Procedure: HERNIA REPAIR VENTRAL ADULT;  Surgeon: Theodoro Kos, DO;  Location: [facility];  Service: Plastics;  Laterality: N/A;  . WOUND DEBRIDEMENT  09/25/2011   Procedure: DEBRIDEMENT CLOSURE/ABDOMINAL WOUND;  Surgeon: Theodoro Kos, DO;  Location: [facility];  Service: Plastics;  Laterality: N/A;  excision of abdominal wound with primary closure   Allergies  Allergen Reactions  . Aspirin Other (See Comments)    HX Bleeding Ulcer   . Nsaids     Hx of bleeding ulcers  . Tolmetin Rash    Hx of bleeding ulcers   Current Outpatient Medications on File Prior to Visit  Medication Sig Dispense Refill  . busPIRone (BUSPAR) 5 MG tablet Take 1 tablet (5 mg total) by mouth 2 (two) times daily. 60 tablet 5  . carvedilol (COREG) 12.5 MG tablet TAKE 1 TABLET TWICE DAILY WITH A MEAL. 60 tablet 8  . HYDROcodone-acetaminophen (NORCO/VICODIN) 5-325 MG tablet Take 1 tablet by mouth every 6 (six) hours as needed for moderate pain (back).    Marland Kitchen levothyroxine (SYNTHROID, LEVOTHROID) 75 MCG tablet TAKE 1 TABLET ONCE DAILY BEFORE BREAKFAST. 30 tablet 11  . losartan (COZAAR) 25 MG tablet TAKE 1 TABLET DAILY. 30 tablet 8  . pantoprazole (PROTONIX) 40 MG tablet TAKE 1 TABLET ONCE DAILY. 30 tablet 5  . PROAIR HFA 108 (90 Base) MCG/ACT inhaler USE 2 PUFFS EVERY 6 HOURS AS NEEDED FOR SHORTNESS OF BREATH AND WHEEZING. 8.5  g 0  . Probiotic Product (PRO-BIOTIC BLEND PO) Take 1 tablet by mouth daily.    Marland Kitchen Spacer/Aero-Holding Chambers (AEROCHAMBER MV) inhaler Use as instructed 1 each 0  . spironolactone (ALDACTONE) 25 MG tablet TAKE 1/2 TABLET AT BEDTIME. 30 tablet 8   No current facility-administered medications on file prior to visit.    Social History   Socioeconomic History  . Marital status: Widowed    Spouse name: Not on file  . Number of children: 2  . Years of education: Not on file  . Highest education level: Not on file  Occupational History  . Occupation: RETIRED  Social Needs  . Financial resource strain: Not on file  . Food insecurity:    Worry: Not on file    Inability: Not on file  . Transportation needs:    Medical: Not on file    Non-medical: Not on file  Tobacco Use  . Smoking status: Former Smoker    Packs/day: 1.50    Years:  45.00    Pack years: 67.50    Types: Cigarettes    Start date: 11/14/1964    Last attempt to quit: 09/22/2010    Years since quitting: 7.2  . Smokeless tobacco: Never Used  . Tobacco comment: quit 09/07/09 or 09/08/10 - Didn't smoke during pregnancies  Substance and Sexual Activity  . Alcohol use: No  . Drug use: No  . Sexual activity: Not Currently  Lifestyle  . Physical activity:    Days per week: Not on file    Minutes per session: Not on file  . Stress: Not on file  Relationships  . Social connections:    Talks on phone: Not on file    Gets together: Not on file    Attends religious service: Not on file    Active member of club or organization: Not on file    Attends meetings of clubs or organizations: Not on file    Relationship status: Not on file  . Intimate partner violence:    Fear of current or ex partner: Not on file    Emotionally abused: Not on file    Physically abused: Not on file    Forced sexual activity: Not on file  Other Topics Concern  . Not on file  Social History Narrative   Marital status:  Widowed as of September 08, 2010; not dating.          Children: four children (1 daughter in [place], 1 daughter in [place] estranged; 1 son in [place]; 1 son deceased in 09/08/14); six grandchildren; 5 gg.      Lives: alone with a yorkie.  Son/Harry lives close.        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      Tobacco:  None; quit in 09/08/2010.        Alcohol: none      Drugs:none      Exercise:  No formal exercise program.  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  Cannot swim.        ADLs:  Driving; cleans own house; does grocery shopping; pays bills; does not use cane for ambulation.  Drives.        Advanced Directives: no living will; no HCPOA.  DNR/DNI.        [facility]:   Originally from [facility]. Has always lived in [place]. She moved to [place], [place]. 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 Has a dog currently. No bird or mold exposure.    Family History  Problem Relation Age of Onset  . Stroke Brother   . Diabetes Brother   . Tuberculosis Father   . Stroke Mother   . Stroke Sister   . Heart disease Sister   . Emphysema Sister   . Diabetes Daughter        Objective:    BP (!) 160/80 (BP Location: Left Arm, Patient Position: Sitting, Cuff Size: Large)   Pulse 78   Ht 5\' 2"  (1.575 m)   BMI 37.86 kg/m   Physical Exam  Constitutional: She is oriented to person, place, and time. She appears well-developed and well-nourished. No distress.  HENT:  Head: Normocephalic and atraumatic.  Right Ear: External ear normal.  Left Ear: External ear normal.  Nose: Nose normal.  Mouth/Throat: Oropharynx is clear and moist.  Eyes: Pupils are equal, round, and reactive to light. Conjunctivae and EOM are normal.  Neck: Normal range of motion and full passive range  of motion without pain. Neck supple. No JVD present. Carotid bruit is not present. No thyromegaly present.  Cardiovascular: Normal rate, regular rhythm and normal heart sounds. Exam reveals no gallop and  no friction rub.  No murmur heard. Pulmonary/Chest: Effort normal and breath sounds normal. She has no wheezes. She has no rales.  Abdominal: Soft. Bowel sounds are normal. She exhibits no distension and no mass. There is no tenderness. There is no rebound and no guarding. A hernia is present.  Right lower quadrant large abdominal hernia nontender.  Musculoskeletal:       Right shoulder: Normal. She exhibits normal range of motion.       Left shoulder: Normal. She exhibits normal range of motion.       Cervical back: Normal.  Lymphadenopathy:    She has no cervical adenopathy.  Neurological: She is alert and oriented to person, place, and time. She has normal reflexes. No cranial nerve deficit. She exhibits normal muscle tone. Coordination normal.  Skin: Skin is warm and dry. No rash noted. She is not diaphoretic. No erythema. No pallor.  Psychiatric: She has a normal mood and affect. Her behavior is normal. Judgment and thought content normal.  Nursing note and vitals reviewed.  No results found. Depression screen [facility] 2/9 12/21/2017 09/09/2017 12/23/2016 09/16/2016 08/19/2016  Decreased Interest 0 0 1 0 0  Down, Depressed, Hopeless 0 0 1 0 0  PHQ - 2 Score 0 0 2 0 0  Altered sleeping - - 1 - -  Tired, decreased energy - - 1 - -  Change in appetite - - 1 - -  Feeling bad or failure about yourself  - - 1 - -  Trouble concentrating - - 1 - -  Moving slowly or fidgety/restless - - 0 - -  Suicidal thoughts - - 0 - -  PHQ-9 Score - - 7 - -  Some recent data might be hidden   Fall Risk  12/21/2017 09/09/2017 12/23/2016 09/16/2016 08/19/2016  Falls in the past year? Yes No No No No  Number falls in past yr: 1 - - - -  Injury with Fall? No - - - -        Assessment & Plan:   1. Anxiety and depression   2. Essential (primary) hypertension   3. Gastroesophageal reflux disease without esophagitis   4. Hyperglycemia   5. Pure hypercholesterolemia   6. Hypothyroidism due to acquired atrophy of  thyroid   7. Chronic systolic CHF (congestive heart failure), NYHA class 2 ([place])   8. Pancreatic pseudocyst   9. Abdominal wall hernia   10. Polymyalgia rheumatica ([place])   11. Psychophysiological insomnia   12. Dizziness     Anxiety and depression with insomnia:  75% improved with addition of Mirtazipine 7.5mg  qhs and Buspar 5mg  bid.  Continue current regimen without adjustments.    PMR: much improved; finally weaned off of steroid therapy.  Venous stasis versus CHF exacerbation: New; obtain labs; rx for Lasix 20mg  daily for 1-2 weeks.  S/p recent follow-up with cardiology.   Hypothyroidism: Controlled.  Obtain labs.  Adjust medications as warranted.  Hyperglycemia/prediabetes: Stable.  Significant weight gain in the past 2 years.  Obtain labs.  Highly recommend dietary modification, weight loss, exercise.  Dizziness: New onset.  2 isolated episodes in the past 2 days.  Normal neurological exam in office.  Obtain labs.  Dehydration possibly contributing.  Recommend increase water intake throughout the day and regular meals.  Recommend 3  meals daily.  Avoid skipping meals.  Patient is maintained on chronic opiate therapy.  If she is taking opiates on an empty stomach she could be suffering with side effects from opiate use.  Pancreatic pseudocyst: stable; will warrant repeat imaging in 2020.    Abdominal wall hernia:  Stable; asymptomatic.  Monitor.    Orders Placed This Encounter  Procedures  . Lipid panel  . Comprehensive metabolic panel  . CBC with Differential/Platelet  . TSH  . T4, free  . Brain natriuretic peptide  . Urinalysis, dipstick only  . HM Diabetes Foot Exam   Meds ordered this encounter  Medications  . DISCONTD: mirtazapine (REMERON) 15 MG tablet    Sig: Take 1 tablet (15 mg total) by mouth at bedtime.    Dispense:  90 tablet    Refill:  1  . citalopram (CELEXA) 20 MG tablet    Sig: Take 1 tablet (20 mg total) by mouth daily.    Dispense:  90 tablet     Refill:  1  . DISCONTD: furosemide (LASIX) 40 MG tablet    Sig: Take 1 tablet (40 mg total) by mouth daily as needed for fluid or edema.    Dispense:  90 tablet    Refill:  0  . furosemide (LASIX) 20 MG tablet    Sig: Take 1 tablet (20 mg total) by mouth daily as needed for fluid or edema.    Dispense:  90 tablet    Refill:  0  . mirtazapine (REMERON) 7.5 MG tablet    Sig: Take 1 tablet (7.5 mg total) by mouth at bedtime.    Dispense:  90 tablet    Refill:  1    Please delete rx for 15mg  Mirtazapine.    Return in about 4 months (around 04/23/2018) for follow-up chronic medical conditions [place].   Kristi Elayne Guerin, M.D. Primary Care at [facility] previously [facility] [place] [address] [place], [place]  [address] [phone] phone [phone] fax

## 2017-12-21 NOTE — Patient Instructions (Addendum)
  DRINK MORE WATER; DRINK LESS SODA. MONITOR BLOOD PRESSURE DAILY.   IF you received an x-ray today, you will receive an invoice from [facility]. Please contact [facility] at [phone] with questions or concerns regarding your invoice.   IF you received labwork today, you will receive an invoice from [facility]. Please contact [facility] at [phone] with questions or concerns regarding your invoice.   Our billing staff will not be able to assist you with questions regarding bills from these companies.  You will be contacted with the lab results as soon as they are available. The fastest way to get your results is to activate your My Chart account. Instructions are located on the last page of this paperwork. If you have not heard from [place] regarding the results in 2 weeks, please contact this office.

## 2017-12-21 NOTE — Telephone Encounter (Signed)
Copied from [place] [id number]. Topic: Inquiry >> Dec 21, 2017  6:38 PM Teresa [name], NT wrote: Reason for CRM: [facility]  called and would like clarification,  they received prescriptions for  furosemide (LASIX) 20 MG tablet and also  furosemide (LASIX) 40 MG tablet back to back ,please advise

## 2017-12-22 ENCOUNTER — Encounter: Payer: Self-pay | Admitting: Family Medicine

## 2017-12-22 LAB — LIPID PANEL
CHOL/HDL RATIO: 7.4 ratio — AB (ref 0.0–4.4)
Cholesterol, Total: 243 mg/dL — ABNORMAL HIGH (ref 100–199)
HDL: 33 mg/dL — ABNORMAL LOW (ref >39)
LDL Calculated: 150 mg/dL — ABNORMAL HIGH (ref 0–99)
Triglycerides: 300 mg/dL — ABNORMAL HIGH (ref 0–149)
VLDL CHOLESTEROL CAL: 60 mg/dL — AB (ref 5–40)

## 2017-12-22 LAB — CBC WITH DIFFERENTIAL/PLATELET
BASOS ABS: 0 10*3/uL (ref 0.0–0.2)
Basos: 1 %
EOS (ABSOLUTE): 0.1 10*3/uL (ref 0.0–0.4)
Eos: 2 %
HEMOGLOBIN: 12.3 g/dL (ref 11.1–15.9)
Hematocrit: 35.3 % (ref 34.0–46.6)
Immature Grans (Abs): 0.1 10*3/uL (ref 0.0–0.1)
Immature Granulocytes: 1 %
LYMPHS ABS: 1.9 10*3/uL (ref 0.7–3.1)
Lymphs: 36 %
MCH: 32.4 pg (ref 26.6–33.0)
MCHC: 34.8 g/dL (ref 31.5–35.7)
MCV: 93 fL (ref 79–97)
MONOS ABS: 0.5 10*3/uL (ref 0.1–0.9)
Monocytes: 9 %
NEUTROS ABS: 2.8 10*3/uL (ref 1.4–7.0)
Neutrophils: 51 %
Platelets: 186 10*3/uL (ref 150–450)
RBC: 3.8 x10E6/uL (ref 3.77–5.28)
RDW: 13.7 % (ref 12.3–15.4)
WBC: 5.3 10*3/uL (ref 3.4–10.8)

## 2017-12-22 LAB — COMPREHENSIVE METABOLIC PANEL
ALBUMIN: 4.4 g/dL (ref 3.5–4.8)
ALK PHOS: 97 IU/L (ref 39–117)
ALT: 10 IU/L (ref 0–32)
AST: 13 IU/L (ref 0–40)
Albumin/Globulin Ratio: 1.8 (ref 1.2–2.2)
BILIRUBIN TOTAL: 0.3 mg/dL (ref 0.0–1.2)
BUN / CREAT RATIO: 19 (ref 12–28)
BUN: 18 mg/dL (ref 8–27)
CHLORIDE: 106 mmol/L (ref 96–106)
CO2: 20 mmol/L (ref 20–29)
CREATININE: 0.96 mg/dL (ref 0.57–1.00)
Calcium: 9.3 mg/dL (ref 8.7–10.3)
GFR calc Af Amer: 66 mL/min/{1.73_m2} (ref >59)
GFR calc non Af Amer: 57 mL/min/{1.73_m2} — ABNORMAL LOW (ref >59)
GLUCOSE: 107 mg/dL — AB (ref 65–99)
Globulin, Total: 2.4 g/dL (ref 1.5–4.5)
Potassium: 5.3 mmol/L — ABNORMAL HIGH (ref 3.5–5.2)
Sodium: 141 mmol/L (ref 134–144)
TOTAL PROTEIN: 6.8 g/dL (ref 6.0–8.5)

## 2017-12-22 LAB — URINALYSIS, DIPSTICK ONLY
BILIRUBIN UA: NEGATIVE
GLUCOSE, UA: NEGATIVE
Ketones, UA: NEGATIVE
Nitrite, UA: NEGATIVE
PH UA: 5.5 (ref 5.0–7.5)
PROTEIN UA: NEGATIVE
RBC, UA: NEGATIVE
Specific Gravity, UA: 1.014 (ref 1.005–1.030)
Urobilinogen, Ur: 0.2 mg/dL (ref 0.2–1.0)

## 2017-12-22 LAB — T4, FREE: FREE T4: 1.05 ng/dL (ref 0.82–1.77)

## 2017-12-22 LAB — BRAIN NATRIURETIC PEPTIDE: BNP: 202.9 pg/mL — ABNORMAL HIGH (ref 0.0–100.0)

## 2017-12-22 LAB — TSH: TSH: 2.18 u[IU]/mL (ref 0.450–4.500)

## 2017-12-22 MED ORDER — MIRTAZAPINE 7.5 MG PO TABS: 7.5000 mg | ORAL_TABLET | Freq: Every day | ORAL | 1 refills | Status: DC

## 2017-12-25 NOTE — Telephone Encounter (Signed)
Spoke with the Pharmacist to clear up the confusion of the mg of Lasix. Per encounter, it is Lasix 20mg .

## 2018-01-04 ENCOUNTER — Other Ambulatory Visit: Payer: Self-pay | Admitting: Nurse Practitioner

## 2018-01-14 ENCOUNTER — Other Ambulatory Visit: Payer: Self-pay | Admitting: Nurse Practitioner

## 2018-03-23 IMAGING — NM NM MISC PROCEDURE
3 series · 18 of 18 positions shown · non-contrast
Comparison: none

[Series 1: stress-gsp_(id)_sa · 6.4mm · 6.40mm/px · 6 of 512 frames shown]
[frame 43/512]
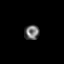
[frame 128/512]
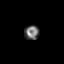
[frame 214/512]
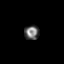
[frame 299/512]
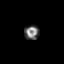
[frame 384/512]
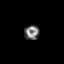
[frame 470/512]
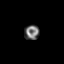

[Series 1: rest_(id)_sa · 6.4mm · 6.40mm/px · 6 of 64 frames shown]
[frame 6/64]
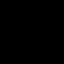
[frame 16/64]
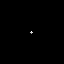
[frame 27/64]
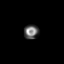
[frame 38/64]
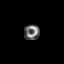
[frame 48/64]
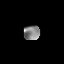
[frame 59/64]
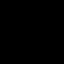

[Series 1: stress-sum-em_(id)_sa · 6.4mm · 6.40mm/px · 6 of 64 frames shown]
[frame 6/64]
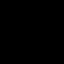
[frame 16/64]
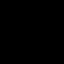
[frame 27/64]
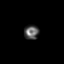
[frame 38/64]
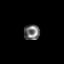
[frame 48/64]
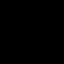
[frame 59/64]
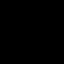

[18 of 18 positions shown; findings below may reference images not displayed]

Canned report from images found in remote index.

Refer to host system for actual result text.

## 2018-03-26 ENCOUNTER — Other Ambulatory Visit: Payer: Self-pay | Admitting: Family Medicine

## 2018-03-26 MED ORDER — BUSPIRONE HCL 5 MG PO TABS: 5.0000 mg | ORAL_TABLET | Freq: Two times a day (BID) | ORAL | 1 refills | Status: DC

## 2018-03-26 NOTE — Telephone Encounter (Signed)
Requested medication (s) are due for refill today:Yes  Requested medication (s) are on the active medication list: Yes  Last refill:  10/01/17  Future visit scheduled: Yes  Notes to clinic:  Controlled substance   Requested Prescriptions  Pending Prescriptions Disp Refills   busPIRone (BUSPAR) 5 MG tablet 60 tablet 5    Sig: Take 1 tablet (5 mg total) by mouth 2 (two) times daily.     Not Delegated - Psychiatry:  Anxiolytics/Hypnotics Failed - 03/26/2018 11:12 AM      Failed - This refill cannot be delegated      Failed - Urine Drug Screen completed in last 360 days.      Passed - Valid encounter within last 6 months    Recent Outpatient Visits          3 months ago Anxiety and depression   Primary Care at [facility], Renette Butters, MD   6 months ago Essential (primary) hypertension   Primary Care at [facility], Renette Butters, MD   1 year ago Essential (primary) hypertension   Primary Care at [facility], Renette Butters, MD   1 year ago Polymyalgia rheumatica [facility])   Primary Care at [facility], Renette Butters, MD   1 year ago Contusion of toe of right foot, unspecified toe, initial encounter   Primary Care at [facility], [place]      Future Appointments            In 4 weeks [name], MD Primary Care at [place], [facility]

## 2018-03-26 NOTE — Telephone Encounter (Signed)
Copied from [place] [id number]. Topic: Quick Communication - Rx Refill/Question >> Mar 26, 2018 10:46 AM Burchel, Abbi R wrote: Medication: busPIRone (BUSPAR) 5 MG tablet  Preferred Pharmacy:  [facility], [place] [address] Phone: [phone] Fax: [phone]  Pharmacy request.

## 2018-04-07 ENCOUNTER — Other Ambulatory Visit: Payer: Self-pay | Admitting: Nurse Practitioner

## 2018-04-23 ENCOUNTER — Ambulatory Visit: Payer: Medicare Other | Admitting: Family Medicine

## 2018-05-12 ENCOUNTER — Ambulatory Visit: Payer: Medicare Other | Admitting: Family Medicine

## 2018-05-26 ENCOUNTER — Ambulatory Visit: Payer: Medicare Other | Admitting: Nurse Practitioner

## 2018-05-31 ENCOUNTER — Other Ambulatory Visit: Payer: Self-pay | Admitting: Family Medicine

## 2018-05-31 ENCOUNTER — Other Ambulatory Visit: Payer: Self-pay | Admitting: Nurse Practitioner

## 2018-06-03 NOTE — Telephone Encounter (Signed)
Please advise 

## 2018-06-03 NOTE — Telephone Encounter (Signed)
Rise Paganini is calling from the pharmacy and would like to know if this could be expedited.

## 2018-06-03 NOTE — Telephone Encounter (Signed)
Rise Paganini is calling from the pharmacy and would like to know if this could be expedited

## 2018-06-07 ENCOUNTER — Encounter: Payer: Self-pay | Admitting: Family Medicine

## 2018-06-07 ENCOUNTER — Ambulatory Visit (INDEPENDENT_AMBULATORY_CARE_PROVIDER_SITE_OTHER): Payer: Medicare Other | Admitting: Family Medicine

## 2018-06-07 ENCOUNTER — Other Ambulatory Visit: Payer: Self-pay

## 2018-06-07 VITALS — BP 122/80 | HR 74 | Temp 97.4°F | Ht 62.0 in | Wt 220.4 lb

## 2018-06-07 DIAGNOSIS — F419 Anxiety disorder, unspecified: Secondary | ICD-10-CM

## 2018-06-07 DIAGNOSIS — I10 Essential (primary) hypertension: Secondary | ICD-10-CM

## 2018-06-07 DIAGNOSIS — M353 Polymyalgia rheumatica: Secondary | ICD-10-CM

## 2018-06-07 DIAGNOSIS — E034 Atrophy of thyroid (acquired): Secondary | ICD-10-CM

## 2018-06-07 DIAGNOSIS — R7303 Prediabetes: Secondary | ICD-10-CM

## 2018-06-07 DIAGNOSIS — F329 Major depressive disorder, single episode, unspecified: Secondary | ICD-10-CM

## 2018-06-07 MED ORDER — BUSPIRONE HCL 5 MG PO TABS: 5.0000 mg | ORAL_TABLET | Freq: Two times a day (BID) | ORAL | 1 refills | Status: DC

## 2018-06-07 NOTE — Patient Instructions (Signed)
° ° ° °  If you have lab work done today you will be contacted with your lab results within the next 2 weeks.  If you have not heard from us then please contact us. The fastest way to get your results is to register for My Chart. ° ° °IF you received an x-ray today, you will receive an invoice from [facility]. Please contact [facility] at [phone] with questions or concerns regarding your invoice.  ° °IF you received labwork today, you will receive an invoice from [facility]. Please contact [facility] at [phone] with questions or concerns regarding your invoice.  ° °Our billing staff will not be able to assist you with questions regarding bills from these companies. ° °You will be contacted with the lab results as soon as they are available. The fastest way to get your results is to activate your My Chart account. Instructions are located on the last page of this paperwork. If you have not heard from us regarding the results in 2 weeks, please contact this office. °  ° ° ° °

## 2018-06-07 NOTE — Progress Notes (Signed)
12/23/20191:48 PM  Teresa [name] 02/15/1941, 77 y.o. female [id number]  Chief Complaint  Patient presents with  . Medication Refill    HPI:   Patient is a 77 y.o. female with past medical history significant for HTN, prediabetes, CHF, polymyalgia rheumatica, hypothyroidism, HLP, pancreatic pseudocyst, depression and anxiety who presents today to establish care  Previous PCP Dr Tamala Julian Last OV July 2019  Patient Care Team: [name], MD as PCP - General (Family Medicine) Minus Breeding, MD as Consulting Physician (Cardiology) Madilyn Hook, DO (Inactive) as Consulting Physician (General Surgery) Hennie Duos, MD as Consulting Physician (Rheumatology)   Has become very sedentary Eats a balance diet, does not eat much red meat, drinks 3 diet pepsi/day She did not complete physical therapy as she felt she did not needed Otherwise she feels she is doing She reports having panic attacks, she talks herself thru it, not often Depression is stable, denies SI Grief for son and husband Takes buspirone, citalopram, mirtazapine  Uses a cane No falls Chronic pain stable, not any worse or better, worse in the morning Takes vicodin every 6 hours, makes pain more bearable. No constiaption  Does not check BP at home Does not have a machine, cant afford Today was a really hard day On carvedilol, lasix, losartan, spironolactone, lasix  Son manages her medication box She lives alone Does not drive Hard of hearing  Declines flu vaccine  Lab Results  Component Value Date   HGBA1C 5.5 09/09/2017   HGBA1C 6.0 08/19/2016   HGBA1C 5.5 02/26/2016   Lab Results  Component Value Date   MICROALBUR 0.4 02/26/2016   LDLCALC 150 (H) 12/21/2017   CREATININE 0.96 12/21/2017    Fall Risk  06/07/2018 12/21/2017 09/09/2017 12/23/2016 09/16/2016  Falls in the past year? 0 Yes No No No  Number falls in past yr: - 1 - - -  Injury with Fall? - No - - -     Depression screen [facility] 2/9  06/07/2018 12/21/2017 09/09/2017  Decreased Interest 0 0 0  Down, Depressed, Hopeless 0 0 0  PHQ - 2 Score 0 0 0  Altered sleeping - - -  Tired, decreased energy - - -  Change in appetite - - -  Feeling bad or failure about yourself  - - -  Trouble concentrating - - -  Moving slowly or fidgety/restless - - -  Suicidal thoughts - - -  PHQ-9 Score - - -  Some recent data might be hidden    Allergies  Allergen Reactions  . Aspirin Other (See Comments)    HX Bleeding Ulcer   . Nsaids     Hx of bleeding ulcers  . Tolmetin Rash    Hx of bleeding ulcers    Prior to Admission medications   Medication Sig Start Date End Date Taking? Authorizing Provider  busPIRone (BUSPAR) 5 MG tablet TAKE 1 TABLET BY MOUTH TWICE DAILY. 06/04/18   Forrest Moron, MD  carvedilol (COREG) 12.5 MG tablet TAKE 1 TABLET BY MOUTH TWICE DAILY WITH A MEAL. 01/14/18   Burtis Junes, NP  citalopram (CELEXA) 20 MG tablet Take 1 tablet (20 mg total) by mouth daily. 12/21/17   Wardell Honour, MD  furosemide (LASIX) 20 MG tablet Take 1 tablet (20 mg total) by mouth daily as needed for fluid or edema. 12/21/17   Wardell Honour, MD  HYDROcodone-acetaminophen (NORCO/VICODIN) 5-325 MG tablet Take 1 tablet by mouth every 6 (six) hours as needed  for moderate pain (back).    [provider]  levothyroxine (SYNTHROID, LEVOTHROID) 75 MCG tablet TAKE 1 TABLET ONCE DAILY BEFORE BREAKFAST. 11/09/17   Wardell Honour, MD  losartan (COZAAR) 25 MG tablet TAKE 1 TABLET BY MOUTH DAILY. 01/04/18   Burtis Junes, NP  mirtazapine (REMERON) 7.5 MG tablet Take 1 tablet (7.5 mg total) by mouth at bedtime. 12/22/17   Wardell Honour, MD  pantoprazole (PROTONIX) 40 MG tablet TAKE 1 TABLET ONCE DAILY. 12/03/17   Wardell Honour, MD  PROAIR HFA 108 [phone] Base) MCG/ACT inhaler USE 2 PUFFS EVERY 6 HOURS AS NEEDED FOR SHORTNESS OF BREATH AND WHEEZING. 07/07/17   Wardell Honour, MD  Probiotic Product (PRO-BIOTIC BLEND PO) Take 1 tablet by mouth  daily.    [provider]  Spacer/Aero-Holding Chambers (AEROCHAMBER MV) inhaler Use as instructed 05/01/16   Javier Glazier, MD  spironolactone (ALDACTONE) 25 MG tablet TAKE 1/2 TABLET AT BEDTIME. 06/01/18   Burtis Junes, NP    Past Medical History:  Diagnosis Date  . Abdominal pain   . Anxiety    maintained on Xanax tid for years.  . Arthritis    FEET, KNEES, HANDS  . Chronic systolic CHF (congestive heart failure), NYHA class 2 ([place])    EF 25% 2011 MV,  50-55% echo 6/13  . Diabetes mellitus type 2, diet-controlled ([place])   . GERD (gastroesophageal reflux disease)   . Glaucoma    s/p surgery B in 30s.  [place].  . H/O hiatal hernia   . History of glaucoma   . History of small bowel obstruction 2009 AND NOV 2012  . HTN (hypertension)   . Hypothyroidism   . Left ovarian cyst   . Moderate mitral regurgitation   . Moderate tricuspid regurgitation   . Non-healing surgical wound    ABDOMINAL S/P EXPLORATOY LAPAROTOMY 04-18-2011  . Nonischemic cardiomyopathy ([place]) DR [facility]    EF is 25% per echo February 2012, non ischemic myoview 12/2009.  EF 50% echo 7/12 and plans for ICD cancelled.   . Peptic ulcer disease   . PVC (premature ventricular contraction)   . Shortness of breath   . Spinal stenosis   . Urge urinary incontinence   . Valvular heart disease    Moderate to severe MR, moderate TR, LAE per echo 7/11  . Wears dentures    upper denture-lower partial    Past Surgical History:  Procedure Laterality Date  . ABDOMINAL HERNIA REPAIR  32 YRS AGO   PERITINITIS  . APPENDECTOMY  1979   RUPTURED  . BENIGN RIGHT BREAST TUMOR REMOVED    . CARDIOVASCULAR STRESS TEST  JULY 2011-  DR HOCHREIN   NO ISCHEMIA  . EXPLORATOMY LAP. / EXTENSIVE LYSIS ADHESIONS/ SMALL BOWEL RESECTION X2 WITH PRIMARY ANASTOMOSIS X2  04-18-2011   SMALL BOWEL OBSTRUCTION  . GLAUCOMA SURGERY  1978  . HERNIA REPAIR    . INCISION AND DRAINAGE OF WOUND N/A 09/13/2012   Procedure: INCISION   AND DEBRIDEMENT WOUND abdominal wall ;  Surgeon: Rolm Bookbinder, MD;  Location: [place];  Service: General;  Laterality: N/A;  coordination with Dr Migdalia Dk   . KNEE ARTHROSCOPY W/ MENISCECTOMY  06-05-2004  . THYROIDECTOMY  1978 GOITOR   AND CERVICAL COLD KNIFE CONE BX  . TONSILLECTOMY  AGE 59  . TRANSTHORACIC ECHOCARDIOGRAM  12-27-2010   EF 45-50%/ MODERATE MV REGURG. / LEFT ATRIUM MILDLY DILATED/ MILD TRICUSPID REGURG.  . TUBAL LIGATION    .  VENTRAL HERNIA REPAIR  X4  LAST ONE 20 YRS AGO   ABDOMINAL --  EACH ONE IN DIFFERENT AREA  . VENTRAL HERNIA REPAIR  01/29/2012   Procedure: HERNIA REPAIR VENTRAL ADULT;  Surgeon: Theodoro Kos, DO;  Location: [place];  Service: Plastics;  Laterality: N/A;  . WOUND DEBRIDEMENT  09/25/2011   Procedure: DEBRIDEMENT CLOSURE/ABDOMINAL WOUND;  Surgeon: Theodoro Kos, DO;  Location: [place];  Service: Plastics;  Laterality: N/A;  excision of abdominal wound with primary closure    Social History   Tobacco Use  . Smoking status: Former Smoker    Packs/day: 1.50    Years: 45.00    Pack years: 67.50    Types: Cigarettes    Start date: 11/14/1964    Last attempt to quit: 09/22/2010    Years since quitting: 7.7  . Smokeless tobacco: Never Used  . Tobacco comment: quit 2011 or 2012 - Didn't smoke during pregnancies  Substance Use Topics  . Alcohol use: No    Family History  Problem Relation Age of Onset  . Stroke Brother   . Diabetes Brother   . Tuberculosis Father   . Stroke Mother   . Stroke Sister   . Heart disease Sister   . Emphysema Sister   . Diabetes Daughter     Review of Systems  Constitutional: Negative for chills and fever.  Respiratory: Positive for shortness of breath (sometimes with sign exertion). Negative for cough.   Cardiovascular: Negative for chest pain, palpitations and leg swelling.  Gastrointestinal: Negative for abdominal pain, nausea and vomiting.  Musculoskeletal: Positive for joint  pain.     OBJECTIVE:  Blood pressure 122/80, pulse 74, temperature (!) 97.4 F (36.3 C), temperature source Oral, height _0  (1.575 m), weight 220 lb 6.4 oz (100 kg), SpO2 96 %. Body mass index is 40.31 kg/m.   Wt Readings from Last 3 Encounters:  06/07/18 220 lb 6.4 oz (100 kg)  09/14/17 207 lb (93.9 kg)  09/09/17 203 lb (92.1 kg)    Physical Exam Vitals signs and nursing note reviewed.  Constitutional:      Appearance: She is well-developed.  HENT:     Head: Normocephalic and atraumatic.     Mouth/Throat:     Pharynx: No oropharyngeal exudate.  Eyes:     General: No scleral icterus.    Conjunctiva/sclera: Conjunctivae normal.     Pupils: Pupils are equal, round, and reactive to light.  Neck:     Musculoskeletal: Neck supple.  Cardiovascular:     Rate and Rhythm: Normal rate and regular rhythm.     Heart sounds: Normal heart sounds. No murmur. No friction rub. No gallop.   Pulmonary:     Effort: Pulmonary effort is normal.     Breath sounds: Normal breath sounds. No wheezing or rales.  Musculoskeletal:     Right lower leg: No edema.     Left lower leg: No edema.  Skin:    General: Skin is warm and dry.  Neurological:     Mental Status: She is alert and oriented to person, place, and time.     ASSESSMENT and PLAN  1. Hypothyroidism due to acquired atrophy of thyroid Checking labs today, medications will be adjusted as needed.  - TSH  2. Essential (primary) hypertension Controlled. Continue current regime.  - Lipid panel - CMP14+EGFR - Care order/instruction:  3. Prediabetes Checking labs today, meds will be adjusted as needed - Hemoglobin A1c  4. Anxiety  and depression Controlled. Continue current regime.   5. Polymyalgia rheumatica (HCC) Stable. On chronic opiates. Patient will call when refills needed Other orders - busPIRone (BUSPAR) 5 MG tablet; Take 1 tablet (5 mg total) by mouth 2 (two) times daily.   Return in about 6 months (around  12/07/2018) for chronic medical conditions.    [name], MD Primary Care at [place], [place] [address] Ph.  [phone] Fax [phone]

## 2018-06-08 LAB — CMP14+EGFR
ALT: 13 IU/L (ref 0–32)
AST: 19 IU/L (ref 0–40)
Albumin/Globulin Ratio: 1.8 (ref 1.2–2.2)
Albumin: 4.2 g/dL (ref 3.5–4.8)
Alkaline Phosphatase: 95 IU/L (ref 39–117)
BUN/Creatinine Ratio: 20 (ref 12–28)
BUN: 18 mg/dL (ref 8–27)
Bilirubin Total: 0.4 mg/dL (ref 0.0–1.2)
CO2: 19 mmol/L — ABNORMAL LOW (ref 20–29)
Calcium: 9.1 mg/dL (ref 8.7–10.3)
Chloride: 104 mmol/L (ref 96–106)
Creatinine, Ser: 0.92 mg/dL (ref 0.57–1.00)
GFR calc Af Amer: 69 mL/min/{1.73_m2} (ref >59)
GFR calc non Af Amer: 60 mL/min/{1.73_m2} (ref >59)
Globulin, Total: 2.3 g/dL (ref 1.5–4.5)
Glucose: 118 mg/dL — ABNORMAL HIGH (ref 65–99)
Potassium: 5 mmol/L (ref 3.5–5.2)
Sodium: 136 mmol/L (ref 134–144)
Total Protein: 6.5 g/dL (ref 6.0–8.5)

## 2018-06-08 LAB — LIPID PANEL
Chol/HDL Ratio: 6.8 ratio — ABNORMAL HIGH (ref 0.0–4.4)
Cholesterol, Total: 226 mg/dL — ABNORMAL HIGH (ref 100–199)
HDL: 33 mg/dL — ABNORMAL LOW (ref >39)
LDL Calculated: 150 mg/dL — ABNORMAL HIGH (ref 0–99)
Triglycerides: 213 mg/dL — ABNORMAL HIGH (ref 0–149)
VLDL Cholesterol Cal: 43 mg/dL — ABNORMAL HIGH (ref 5–40)

## 2018-06-08 LAB — HEMOGLOBIN A1C
Est. average glucose Bld gHb Est-mCnc: 108 mg/dL
Hgb A1c MFr Bld: 5.4 % (ref 4.8–5.6)

## 2018-06-08 LAB — TSH: TSH: 2.46 u[IU]/mL (ref 0.450–4.500)

## 2018-06-11 ENCOUNTER — Encounter: Payer: Self-pay | Admitting: Cardiology

## 2018-06-11 NOTE — Telephone Encounter (Signed)
Erroneous encounter

## 2018-06-15 MED ORDER — ATORVASTATIN CALCIUM 10 MG PO TABS: 10.0000 mg | ORAL_TABLET | Freq: Every day | ORAL | 3 refills | Status: DC

## 2018-06-15 NOTE — Addendum Note (Signed)
Addended by: [name] on: 06/15/2018 04:16 PM   Modules accepted: Orders

## 2018-06-19 ENCOUNTER — Telehealth: Payer: Self-pay | Admitting: *Deleted

## 2018-06-19 NOTE — Telephone Encounter (Signed)
Patient called and lab results given with directions to start taking atorvastatin 10 mg because cholesterol is elevated. Her diabetes and hypothyroidism is good. Patient started medication for cholesterol.

## 2018-06-28 ENCOUNTER — Other Ambulatory Visit: Payer: Self-pay | Admitting: Family Medicine

## 2018-07-15 ENCOUNTER — Other Ambulatory Visit: Payer: Self-pay | Admitting: Family Medicine

## 2018-07-15 NOTE — Telephone Encounter (Signed)
Requested medication (s) are due for refill today -yes  Requested medication (s) are on the active medication list -yes  Future visit scheduled -yes  Last refill: 12/22/17  Notes to clinic: Patient is requesting medication prescribed by provider no longer in practice. Patient has since established with new PCP. Rx request sent for review.  Requested Prescriptions  Pending Prescriptions Disp Refills   mirtazapine (REMERON) 7.5 MG tablet [Pharmacy Med Name: MIRTAZAPINE 7.5 MG TABLET] 30 tablet 0    Sig: TAKE ONE TABLET AT BEDTIME.     Psychiatry: Antidepressants - mirtazapine Failed - 07/15/2018 10:48 AM      Failed - Triglycerides in normal range and within 360 days    Triglycerides  Date Value Ref Range Status  06/07/2018 213 (H) 0 - 149 mg/dL Final         Failed - Total Cholesterol in normal range and within 360 days    Cholesterol, Total  Date Value Ref Range Status  06/07/2018 226 (H) 100 - 199 mg/dL Final         Passed - AST in normal range and within 360 days    AST  Date Value Ref Range Status  06/07/2018 19 0 - 40 IU/L Final         Passed - ALT in normal range and within 360 days    ALT  Date Value Ref Range Status  06/07/2018 13 0 - 32 IU/L Final         Passed - WBC in normal range and within 360 days    WBC  Date Value Ref Range Status  12/21/2017 5.3 3.4 - 10.8 x10E3/uL Final  04/23/2016 8.4 3.8 - 10.8 K/uL Final         Passed - Completed PHQ-2 or PHQ-9 in the last 360 days.      Passed - Valid encounter within last 6 months    Recent Outpatient Visits          1 month ago Hypothyroidism due to acquired atrophy of thyroid   [facility] at [facility], Lilia Argue, MD   6 months ago Anxiety and depression   Primary Care at [facility], Renette Butters, MD   10 months ago Essential (primary) hypertension   Primary Care at [facility], Renette Butters, MD   1 year ago Essential (primary) hypertension   Primary Care at [facility], Renette Butters, MD   1 year ago  Polymyalgia rheumatica [facility])   Primary Care at [facility], Renette Butters, MD      Future Appointments            In 4 months [name], MD Primary Care at [place], [facility]            Requested Prescriptions  Pending Prescriptions Disp Refills   mirtazapine (REMERON) 7.5 MG tablet [Pharmacy Med Name: MIRTAZAPINE 7.5 MG TABLET] 30 tablet 0    Sig: TAKE ONE TABLET AT BEDTIME.     Psychiatry: Antidepressants - mirtazapine Failed - 07/15/2018 10:48 AM      Failed - Triglycerides in normal range and within 360 days    Triglycerides  Date Value Ref Range Status  06/07/2018 213 (H) 0 - 149 mg/dL Final         Failed - Total Cholesterol in normal range and within 360 days    Cholesterol, Total  Date Value Ref Range Status  06/07/2018 226 (H) 100 - 199 mg/dL Final  Passed - AST in normal range and within 360 days    AST  Date Value Ref Range Status  06/07/2018 19 0 - 40 IU/L Final         Passed - ALT in normal range and within 360 days    ALT  Date Value Ref Range Status  06/07/2018 13 0 - 32 IU/L Final         Passed - WBC in normal range and within 360 days    WBC  Date Value Ref Range Status  12/21/2017 5.3 3.4 - 10.8 x10E3/uL Final  04/23/2016 8.4 3.8 - 10.8 K/uL Final         Passed - Completed PHQ-2 or PHQ-9 in the last 360 days.      Passed - Valid encounter within last 6 months    Recent Outpatient Visits          1 month ago Hypothyroidism due to acquired atrophy of thyroid   [facility] at [facility], Lilia Argue, MD   6 months ago Anxiety and depression   Primary Care at [facility], Renette Butters, MD   10 months ago Essential (primary) hypertension   Primary Care at [facility], Renette Butters, MD   1 year ago Essential (primary) hypertension   Primary Care at [facility], Renette Butters, MD   1 year ago Polymyalgia rheumatica [facility])   Primary Care at [facility], Renette Butters, MD      Future Appointments            In 4 months [name], MD  Primary Care at [place], [facility]

## 2018-07-16 NOTE — Telephone Encounter (Signed)
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 They are hoping to get this sent in for her early today so they can make 1pm delivery truck for the patient as they cannot deliver it over the weekend and she is out.

## 2018-08-10 ENCOUNTER — Inpatient Hospital Stay (HOSPITAL_COMMUNITY)
Admission: EM | Admit: 2018-08-10 | Discharge: 2018-08-17 | DRG: 682 | Disposition: A | Discharge status: Home / Self Care | Payer: Medicare Other | Attending: Internal Medicine | Admitting: Internal Medicine

## 2018-08-10 ENCOUNTER — Other Ambulatory Visit: Payer: Self-pay

## 2018-08-10 ENCOUNTER — Encounter: Payer: Self-pay | Admitting: Family Medicine

## 2018-08-10 ENCOUNTER — Emergency Department (HOSPITAL_COMMUNITY): Payer: Medicare Other

## 2018-08-10 ENCOUNTER — Ambulatory Visit (INDEPENDENT_AMBULATORY_CARE_PROVIDER_SITE_OTHER): Payer: Medicare Other | Admitting: Family Medicine

## 2018-08-10 ENCOUNTER — Inpatient Hospital Stay (HOSPITAL_COMMUNITY): Payer: Medicare Other

## 2018-08-10 ENCOUNTER — Encounter (HOSPITAL_COMMUNITY): Payer: Self-pay | Admitting: Emergency Medicine

## 2018-08-10 VITALS — BP 72/46 | HR 72 | Temp 98.2°F | Resp 17

## 2018-08-10 DIAGNOSIS — I5023 Acute on chronic systolic (congestive) heart failure: Secondary | ICD-10-CM | POA: Diagnosis present

## 2018-08-10 DIAGNOSIS — K580 Irritable bowel syndrome with diarrhea: Secondary | ICD-10-CM | POA: Diagnosis present

## 2018-08-10 DIAGNOSIS — F329 Major depressive disorder, single episode, unspecified: Secondary | ICD-10-CM | POA: Diagnosis present

## 2018-08-10 DIAGNOSIS — K219 Gastro-esophageal reflux disease without esophagitis: Secondary | ICD-10-CM | POA: Diagnosis present

## 2018-08-10 DIAGNOSIS — E8729 Other acidosis: Secondary | ICD-10-CM | POA: Diagnosis present

## 2018-08-10 DIAGNOSIS — E890 Postprocedural hypothyroidism: Secondary | ICD-10-CM | POA: Diagnosis present

## 2018-08-10 DIAGNOSIS — E872 Acidosis: Secondary | ICD-10-CM | POA: Diagnosis present

## 2018-08-10 DIAGNOSIS — I5021 Acute systolic (congestive) heart failure: Secondary | ICD-10-CM | POA: Diagnosis not present

## 2018-08-10 DIAGNOSIS — E861 Hypovolemia: Secondary | ICD-10-CM

## 2018-08-10 DIAGNOSIS — E871 Hypo-osmolality and hyponatremia: Secondary | ICD-10-CM | POA: Diagnosis present

## 2018-08-10 DIAGNOSIS — I5022 Chronic systolic (congestive) heart failure: Secondary | ICD-10-CM | POA: Diagnosis not present

## 2018-08-10 DIAGNOSIS — I428 Other cardiomyopathies: Secondary | ICD-10-CM | POA: Diagnosis present

## 2018-08-10 DIAGNOSIS — N189 Chronic kidney disease, unspecified: Secondary | ICD-10-CM | POA: Diagnosis present

## 2018-08-10 DIAGNOSIS — M353 Polymyalgia rheumatica: Secondary | ICD-10-CM | POA: Diagnosis present

## 2018-08-10 DIAGNOSIS — I959 Hypotension, unspecified: Secondary | ICD-10-CM

## 2018-08-10 DIAGNOSIS — M25512 Pain in left shoulder: Secondary | ICD-10-CM

## 2018-08-10 DIAGNOSIS — R7989 Other specified abnormal findings of blood chemistry: Secondary | ICD-10-CM

## 2018-08-10 DIAGNOSIS — F419 Anxiety disorder, unspecified: Secondary | ICD-10-CM | POA: Diagnosis present

## 2018-08-10 DIAGNOSIS — Z8249 Family history of ischemic heart disease and other diseases of the circulatory system: Secondary | ICD-10-CM

## 2018-08-10 DIAGNOSIS — L8990 Pressure ulcer of unspecified site, unspecified stage: Secondary | ICD-10-CM

## 2018-08-10 DIAGNOSIS — M19072 Primary osteoarthritis, left ankle and foot: Secondary | ICD-10-CM | POA: Diagnosis present

## 2018-08-10 DIAGNOSIS — E034 Atrophy of thyroid (acquired): Secondary | ICD-10-CM

## 2018-08-10 DIAGNOSIS — E119 Type 2 diabetes mellitus without complications: Secondary | ICD-10-CM | POA: Diagnosis not present

## 2018-08-10 DIAGNOSIS — R9431 Abnormal electrocardiogram [ECG] [EKG]: Secondary | ICD-10-CM | POA: Diagnosis not present

## 2018-08-10 DIAGNOSIS — M170 Bilateral primary osteoarthritis of knee: Secondary | ICD-10-CM | POA: Diagnosis present

## 2018-08-10 DIAGNOSIS — R5383 Other fatigue: Secondary | ICD-10-CM | POA: Diagnosis not present

## 2018-08-10 DIAGNOSIS — I480 Paroxysmal atrial fibrillation: Secondary | ICD-10-CM | POA: Diagnosis present

## 2018-08-10 DIAGNOSIS — I493 Ventricular premature depolarization: Secondary | ICD-10-CM | POA: Diagnosis present

## 2018-08-10 DIAGNOSIS — I110 Hypertensive heart disease with heart failure: Secondary | ICD-10-CM | POA: Diagnosis present

## 2018-08-10 DIAGNOSIS — L89321 Pressure ulcer of left buttock, stage 1: Secondary | ICD-10-CM | POA: Diagnosis present

## 2018-08-10 DIAGNOSIS — Z79899 Other long term (current) drug therapy: Secondary | ICD-10-CM

## 2018-08-10 DIAGNOSIS — N170 Acute kidney failure with tubular necrosis: Secondary | ICD-10-CM | POA: Diagnosis present

## 2018-08-10 DIAGNOSIS — Z886 Allergy status to analgesic agent status: Secondary | ICD-10-CM

## 2018-08-10 DIAGNOSIS — M19041 Primary osteoarthritis, right hand: Secondary | ICD-10-CM | POA: Diagnosis present

## 2018-08-10 DIAGNOSIS — M19042 Primary osteoarthritis, left hand: Secondary | ICD-10-CM | POA: Diagnosis present

## 2018-08-10 DIAGNOSIS — G8929 Other chronic pain: Secondary | ICD-10-CM | POA: Diagnosis present

## 2018-08-10 DIAGNOSIS — F32A Depression, unspecified: Secondary | ICD-10-CM | POA: Diagnosis present

## 2018-08-10 DIAGNOSIS — N179 Acute kidney failure, unspecified: Secondary | ICD-10-CM | POA: Diagnosis not present

## 2018-08-10 DIAGNOSIS — Z833 Family history of diabetes mellitus: Secondary | ICD-10-CM

## 2018-08-10 DIAGNOSIS — E860 Dehydration: Secondary | ICD-10-CM

## 2018-08-10 DIAGNOSIS — F05 Delirium due to known physiological condition: Secondary | ICD-10-CM | POA: Diagnosis present

## 2018-08-10 DIAGNOSIS — I340 Nonrheumatic mitral (valve) insufficiency: Secondary | ICD-10-CM | POA: Diagnosis not present

## 2018-08-10 DIAGNOSIS — M25511 Pain in right shoulder: Secondary | ICD-10-CM | POA: Diagnosis not present

## 2018-08-10 DIAGNOSIS — I9589 Other hypotension: Secondary | ICD-10-CM | POA: Diagnosis present

## 2018-08-10 DIAGNOSIS — I4891 Unspecified atrial fibrillation: Secondary | ICD-10-CM

## 2018-08-10 DIAGNOSIS — Z79890 Hormone replacement therapy (postmenopausal): Secondary | ICD-10-CM

## 2018-08-10 DIAGNOSIS — M19071 Primary osteoarthritis, right ankle and foot: Secondary | ICD-10-CM | POA: Diagnosis present

## 2018-08-10 DIAGNOSIS — E876 Hypokalemia: Secondary | ICD-10-CM | POA: Diagnosis present

## 2018-08-10 DIAGNOSIS — I5020 Unspecified systolic (congestive) heart failure: Secondary | ICD-10-CM | POA: Diagnosis not present

## 2018-08-10 DIAGNOSIS — E039 Hypothyroidism, unspecified: Secondary | ICD-10-CM | POA: Diagnosis present

## 2018-08-10 DIAGNOSIS — R531 Weakness: Secondary | ICD-10-CM | POA: Diagnosis present

## 2018-08-10 DIAGNOSIS — Z87898 Personal history of other specified conditions: Secondary | ICD-10-CM

## 2018-08-10 DIAGNOSIS — Z87891 Personal history of nicotine dependence: Secondary | ICD-10-CM

## 2018-08-10 DIAGNOSIS — R778 Other specified abnormalities of plasma proteins: Secondary | ICD-10-CM | POA: Diagnosis present

## 2018-08-10 DIAGNOSIS — Z8719 Personal history of other diseases of the digestive system: Secondary | ICD-10-CM

## 2018-08-10 LAB — POCT I-STAT EG7
ACID-BASE DEFICIT: 10 mmol/L — AB (ref 0.0–2.0)
Bicarbonate: 14.5 mmol/L — ABNORMAL LOW (ref 20.0–28.0)
Calcium, Ion: 0.76 mmol/L — CL (ref 1.15–1.40)
HCT: 36 % (ref 36.0–46.0)
Hemoglobin: 12.2 g/dL (ref 12.0–15.0)
O2 Saturation: 68 %
PO2 VEN: 38 mmHg (ref 32.0–45.0)
Potassium: 2.4 mmol/L — CL (ref 3.5–5.1)
Sodium: 121 mmol/L — ABNORMAL LOW (ref 135–145)
TCO2: 15 mmol/L — ABNORMAL LOW (ref 22–32)
pCO2, Ven: 28.8 mmHg — ABNORMAL LOW (ref 44.0–60.0)
pH, Ven: 7.309 (ref 7.250–7.430)

## 2018-08-10 LAB — URINALYSIS, ROUTINE W REFLEX MICROSCOPIC
Bilirubin Urine: NEGATIVE
Glucose, UA: NEGATIVE mg/dL
Hgb urine dipstick: NEGATIVE
Ketones, ur: NEGATIVE mg/dL
Leukocytes,Ua: NEGATIVE
Nitrite: NEGATIVE
Protein, ur: NEGATIVE mg/dL
Specific Gravity, Urine: 1.015 (ref 1.005–1.030)
pH: 5 (ref 5.0–8.0)

## 2018-08-10 LAB — COMPREHENSIVE METABOLIC PANEL
ALBUMIN: 3.7 g/dL (ref 3.5–5.0)
ALT: 11 U/L (ref 0–44)
AST: 12 U/L — ABNORMAL LOW (ref 15–41)
Alkaline Phosphatase: 70 U/L (ref 38–126)
Anion gap: 28 — ABNORMAL HIGH (ref 5–15)
BUN: 104 mg/dL — ABNORMAL HIGH (ref 8–23)
CO2: 11 mmol/L — ABNORMAL LOW (ref 22–32)
Calcium: 6.4 mg/dL — CL (ref 8.9–10.3)
Chloride: 85 mmol/L — ABNORMAL LOW (ref 98–111)
Creatinine, Ser: 6.19 mg/dL — ABNORMAL HIGH (ref 0.44–1.00)
GFR calc Af Amer: 7 mL/min — ABNORMAL LOW (ref >60)
GFR calc non Af Amer: 6 mL/min — ABNORMAL LOW (ref >60)
Glucose, Bld: 93 mg/dL (ref 70–99)
Potassium: 2.3 mmol/L — CL (ref 3.5–5.1)
Sodium: 124 mmol/L — ABNORMAL LOW (ref 135–145)
Total Bilirubin: 1.4 mg/dL — ABNORMAL HIGH (ref 0.3–1.2)
Total Protein: 6.8 g/dL (ref 6.5–8.1)

## 2018-08-10 LAB — CBC WITH DIFFERENTIAL/PLATELET
Abs Immature Granulocytes: 0.04 10*3/uL (ref 0.00–0.07)
Basophils Absolute: 0.1 10*3/uL (ref 0.0–0.1)
Basophils Relative: 1 %
Eosinophils Absolute: 0 10*3/uL (ref 0.0–0.5)
Eosinophils Relative: 1 %
HCT: 37.4 % (ref 36.0–46.0)
Hemoglobin: 13.2 g/dL (ref 12.0–15.0)
Immature Granulocytes: 1 %
LYMPHS ABS: 1.3 10*3/uL (ref 0.7–4.0)
Lymphocytes Relative: 21 %
MCH: 32 pg (ref 26.0–34.0)
MCHC: 35.3 g/dL (ref 30.0–36.0)
MCV: 90.8 fL (ref 80.0–100.0)
MONOS PCT: 10 %
Monocytes Absolute: 0.6 10*3/uL (ref 0.1–1.0)
Neutro Abs: 4.2 10*3/uL (ref 1.7–7.7)
Neutrophils Relative %: 66 %
Platelets: 269 10*3/uL (ref 150–400)
RBC: 4.12 MIL/uL (ref 3.87–5.11)
RDW: 12.1 % (ref 11.5–15.5)
WBC: 6.3 10*3/uL (ref 4.0–10.5)
nRBC: 0 % (ref 0.0–0.2)

## 2018-08-10 LAB — MAGNESIUM: Magnesium: 2.5 mg/dL — ABNORMAL HIGH (ref 1.7–2.4)

## 2018-08-10 LAB — TROPONIN I: Troponin I: 0.04 ng/mL (ref <0.03)

## 2018-08-10 LAB — LACTIC ACID, PLASMA: Lactic Acid, Venous: 1.2 mmol/L (ref 0.5–1.9)

## 2018-08-10 LAB — APTT: aPTT: 32 seconds (ref 24–36)

## 2018-08-10 MED ORDER — SODIUM CHLORIDE 0.9 % IV BOLUS (SEPSIS)
1000.0000 mL | Freq: Once | INTRAVENOUS | Status: AC
  Administered 2018-08-10: 1000 mL via INTRAVENOUS

## 2018-08-10 MED ORDER — CALCIUM GLUCONATE-NACL 2-0.675 GM/100ML-% IV SOLN
2.0000 g | Freq: Once | INTRAVENOUS | Status: AC
  Administered 2018-08-11: 2000 mg via INTRAVENOUS
  Filled 2018-08-10: qty 100

## 2018-08-10 MED ORDER — FAMOTIDINE 20 MG PO TABS
10.0000 mg | ORAL_TABLET | Freq: Every day | ORAL | Status: DC
  Administered 2018-08-11 – 2018-08-16 (×6): 10 mg via ORAL
  Filled 2018-08-10 (×7): qty 1

## 2018-08-10 MED ORDER — ACETAMINOPHEN 650 MG RE SUPP: 650.0000 mg | Freq: Four times a day (QID) | RECTAL | Status: DC | PRN

## 2018-08-10 MED ORDER — HYDROCODONE-ACETAMINOPHEN 5-325 MG PO TABS
1.0000 | ORAL_TABLET | Freq: Three times a day (TID) | ORAL | Status: DC | PRN
  Administered 2018-08-11 – 2018-08-16 (×7): 1 via ORAL
  Filled 2018-08-10 (×8): qty 1

## 2018-08-10 MED ORDER — ACETAMINOPHEN 325 MG PO TABS
650.0000 mg | ORAL_TABLET | Freq: Four times a day (QID) | ORAL | Status: DC | PRN
  Administered 2018-08-12: 650 mg via ORAL

## 2018-08-10 MED ORDER — POTASSIUM CHLORIDE CRYS ER 20 MEQ PO TBCR
40.0000 meq | EXTENDED_RELEASE_TABLET | Freq: Once | ORAL | Status: AC
  Administered 2018-08-11: 40 meq via ORAL
  Filled 2018-08-10: qty 2

## 2018-08-10 MED ORDER — SODIUM BICARBONATE 8.4 % IV SOLN
INTRAVENOUS | Status: AC
  Administered 2018-08-11 (×2): via INTRAVENOUS
  Filled 2018-08-10 (×3): qty 150

## 2018-08-10 MED ORDER — POTASSIUM CHLORIDE 10 MEQ/100ML IV SOLN
10.0000 meq | INTRAVENOUS | Status: AC
  Administered 2018-08-11 (×3): 10 meq via INTRAVENOUS
  Filled 2018-08-10 (×3): qty 100

## 2018-08-10 MED ORDER — ATORVASTATIN CALCIUM 10 MG PO TABS
10.0000 mg | ORAL_TABLET | Freq: Every day | ORAL | Status: DC
  Administered 2018-08-11 – 2018-08-16 (×6): 10 mg via ORAL
  Filled 2018-08-10 (×6): qty 1

## 2018-08-10 MED ORDER — ONDANSETRON HCL 4 MG PO TABS: 4.0000 mg | ORAL_TABLET | Freq: Four times a day (QID) | ORAL | Status: DC | PRN

## 2018-08-10 MED ORDER — ONDANSETRON HCL 4 MG/2ML IJ SOLN: 4.0000 mg | Freq: Four times a day (QID) | INTRAMUSCULAR | Status: DC | PRN

## 2018-08-10 MED ORDER — SODIUM CHLORIDE 0.9% FLUSH
3.0000 mL | Freq: Two times a day (BID) | INTRAVENOUS | Status: DC
  Administered 2018-08-12 – 2018-08-15 (×6): 3 mL via INTRAVENOUS

## 2018-08-10 MED ORDER — POTASSIUM CHLORIDE CRYS ER 20 MEQ PO TBCR
40.0000 meq | EXTENDED_RELEASE_TABLET | Freq: Once | ORAL | Status: AC
  Administered 2018-08-10: 40 meq via ORAL
  Filled 2018-08-10: qty 2

## 2018-08-10 MED ORDER — CITALOPRAM HYDROBROMIDE 20 MG PO TABS
20.0000 mg | ORAL_TABLET | Freq: Every day | ORAL | Status: DC
  Administered 2018-08-11 – 2018-08-17 (×7): 20 mg via ORAL
  Filled 2018-08-10: qty 1
  Filled 2018-08-10: qty 2
  Filled 2018-08-10 (×5): qty 1

## 2018-08-10 MED ORDER — SODIUM CHLORIDE 0.9 % IV BOLUS (SEPSIS)
1000.0000 mL | Freq: Once | INTRAVENOUS | Status: AC
  Administered 2018-08-11: 1000 mL via INTRAVENOUS

## 2018-08-10 MED ORDER — LEVOTHYROXINE SODIUM 75 MCG PO TABS
75.0000 ug | ORAL_TABLET | Freq: Every day | ORAL | Status: DC
  Administered 2018-08-11 – 2018-08-17 (×7): 75 ug via ORAL
  Filled 2018-08-10: qty 3
  Filled 2018-08-10: qty 1
  Filled 2018-08-10: qty 3
  Filled 2018-08-10 (×4): qty 1

## 2018-08-10 MED ORDER — PANTOPRAZOLE SODIUM 40 MG PO TBEC
40.0000 mg | DELAYED_RELEASE_TABLET | Freq: Every day | ORAL | Status: DC
  Administered 2018-08-11 – 2018-08-17 (×7): 40 mg via ORAL
  Filled 2018-08-10 (×7): qty 1

## 2018-08-10 MED ORDER — LACTATED RINGERS IV BOLUS
1000.0000 mL | Freq: Once | INTRAVENOUS | Status: AC
  Administered 2018-08-10: 1000 mL via INTRAVENOUS

## 2018-08-10 MED ORDER — ALBUTEROL SULFATE (2.5 MG/3ML) 0.083% IN NEBU
3.0000 mL | INHALATION_SOLUTION | Freq: Four times a day (QID) | RESPIRATORY_TRACT | Status: DC | PRN
  Administered 2018-08-17 (×2): 3 mL via RESPIRATORY_TRACT
  Filled 2018-08-10: qty 3

## 2018-08-10 MED ORDER — HEPARIN SODIUM (PORCINE) 5000 UNIT/ML IJ SOLN
5000.0000 [IU] | Freq: Three times a day (TID) | INTRAMUSCULAR | Status: DC
  Administered 2018-08-11 – 2018-08-13 (×7): 5000 [IU] via SUBCUTANEOUS
  Filled 2018-08-10 (×7): qty 1

## 2018-08-10 NOTE — Progress Notes (Signed)
Patient ID: Teresa [name], female    DOB: 12-18-1940  Age: 78 y.o. MRN: [id number]  Chief Complaint  Patient presents with  . Fatigue    Subjective:   78 year old patient who comes in today with a history of having been feeling weak since Saturday.  She lives alone, and does her own activities in the house.  Her son brought her in here today, walked her to the car with some difficulty.  She really denies any other symptoms other than the weakness.  She is on a list of medications, but only took her carvedilol this morning.  Constitutional: Weakness HEENT: No ear pain.  She is deaf.  No sore throat or runny nose. Cardiovascular: No chest pain or palpitations except for some time she is occasionally gotten some very mild short acting pain in the right upper chest.  She wondered whether it was her gallbladder. Respiratory: Unremarkable.  No cough or wheezing or excessive shortness of breath Gastrointestinal.  She has a long history of intermittent diarrhea.  She had some last week.  She has not had it over the weekend. GU: No dysuria or excessive frequency.  She has been drinking enough that she urinated 3 times a day. Musculoskeletal: Is ambulatory Neurologic: No abnormalities Endocrine: Takes thyroid medicine.  Current allergies, medications, problem list, past/family and social histories reviewed.  Objective:  BP (!) 72/46   Pulse 72   Temp 98.2 F (36.8 C) (Oral)   Resp 17   SpO2 98%   Sitting in a wheelchair, a little pale but color adequate.  Alert and oriented.  Deaf and needs to be spoken to loudly.  TMs have some wax in both canals but appear normal.  Throat clear.  Eyes PRL.  Neck supple without significant nodes.  No carotid bruits.  Chest is clear to auscultation.  Heart sounds are muffled but regula.  Abdomen soft and nontender.  Remedies without edema.  No CVA tenderness.  Blood pressure was taken in both arms.  In the right arm I got 66/50 in the left 74/50.  This is  consistent with what the staff got on admission.  Assessment & Plan:   Assessment: 1. Hypotension, unspecified hypotension type   2. Chronic systolic CHF (congestive heart failure), NYHA class 2 (HCC)   3. Weakness   4. Fatigue, unspecified type   5. History of diarrhea       Plan: Certain what is going on with her, but she has nonspecific hypotension.  It could be from having had some diarrhea last week, but she is not having that problem now.  She could have some metabolic problem, or it could be cardiogenic.  She will be sent to the emergency room.  No orders of the defined types were placed in this encounter.   No orders of the defined types were placed in this encounter.        Patient Instructions       If you have lab work done today you will be contacted with your lab results within the next 2 weeks.  If you have not heard from [place] then please contact us. The fastest way to get your results is to register for My Chart.   IF you received an x-ray today, you will receive an invoice from [facility]. Please contact [facility] at [phone] with questions or concerns regarding your invoice.   IF you received labwork today, you will receive an invoice from [facility]. Please contact [facility] at [phone]  with questions or concerns regarding your invoice.   Our billing staff will not be able to assist you with questions regarding bills from these companies.  You will be contacted with the lab results as soon as they are available. The fastest way to get your results is to activate your My Chart account. Instructions are located on the last page of this paperwork. If you have not heard from [place] regarding the results in 2 weeks, please contact this office.         No follow-ups on file.   Ruben Reason, MD 08/10/2018

## 2018-08-10 NOTE — ED Triage Notes (Signed)
Pt arrives from Dr office vis Charisse Klinefelter. Pt reports feeling very weak and dizzy. Pt endorses nausea but refused medication during transport. Pt A&O x4

## 2018-08-10 NOTE — ED Notes (Signed)
RN informed Pt can receive visitor  

## 2018-08-10 NOTE — ED Notes (Signed)
Patient placed on hospital bed for comfort °

## 2018-08-10 NOTE — H&P (Signed)
History and Physical    Teresa [name] QQI:[id number] DOB: 1941/06/06 DOA: 08/10/2018  PCP: [name], MD   Patient coming from: Home   Chief Complaint: Generalized weakness   HPI: Teresa [name] is a 78 y.o. female with medical history significant for hypothyroidism, depression with anxiety, hypertension, history of systolic CHF with EF normalized in 2016, GERD, and chronic pain, now presenting to the emergency department for evaluation of generalized weakness.  Patient is accompanied by her son who assist with the history.  She reports history of IBS with frequent bouts of diarrhea, was having worse diarrhea than usual last week, lasting several days with several episodes of watery diarrhea daily.  There was no fevers, chills, abdominal pain, or vomiting associated with this.  She denies any chest pain or shortness of breath.  She denies any leg swelling or tenderness.  She reports occasional shoulder pains, worse on the right.  ED Course: Upon arrival to the ED, patient is found to be afebrile, saturating adequately on room air, blood pressure in the 94R systolic initially.  EKG with artifact, uncertain rhythm, PVCs, repolarization abnormality, and QTc interval of 527 ms.  Chest x-ray is notable for mild cardiomegaly without acute findings.  Chemistry panel features a sodium of 124, potassium 2.3, bicarbonate 11, BUN 104, creatinine 6.19, anion gap 28, and calcium 6.4.  CBC is unremarkable and lactic acid is normal.  Urinalysis unremarkable.  Patient was given 2 L of IV fluids and 40 mEq oral potassium in the ED.  Blood pressure improved and remained stable and the patient will be admitted for further evaluation and management.  Review of Systems:  All other systems reviewed and apart from HPI, are negative.  Past Medical History:  Diagnosis Date  . Abdominal pain   . Anxiety    maintained on Xanax tid for years.  . Arthritis    FEET, KNEES, HANDS  . Chronic systolic CHF (congestive  heart failure), NYHA class 2 ([place])    EF 25% 2011 MV,  50-55% echo 6/13  . Diabetes mellitus type 2, diet-controlled ([place])   . GERD (gastroesophageal reflux disease)   . Glaucoma    s/p surgery B in 30s.  [place].  . H/O hiatal hernia   . History of glaucoma   . History of small bowel obstruction 2009 AND NOV 2012  . HTN (hypertension)   . Hypothyroidism   . Left ovarian cyst   . Moderate mitral regurgitation   . Moderate tricuspid regurgitation   . Non-healing surgical wound    ABDOMINAL S/P EXPLORATOY LAPAROTOMY 04-18-2011  . Nonischemic cardiomyopathy ([place]) [facility]    EF is 25% per echo February 2012, non ischemic myoview 12/2009.  EF 50% echo 7/12 and plans for ICD cancelled.   . Peptic ulcer disease   . PVC (premature ventricular contraction)   . Shortness of breath   . Spinal stenosis   . Urge urinary incontinence   . Valvular heart disease    Moderate to severe MR, moderate TR, LAE per echo 7/11  . Wears dentures    upper denture-lower partial    Past Surgical History:  Procedure Laterality Date  . ABDOMINAL HERNIA REPAIR  32 YRS AGO   PERITINITIS  . APPENDECTOMY  1979   RUPTURED  . BENIGN RIGHT BREAST TUMOR REMOVED    . CARDIOVASCULAR STRESS TEST  JULY 2011-  DR HOCHREIN   NO ISCHEMIA  . EXPLORATOMY LAP. / EXTENSIVE LYSIS ADHESIONS/ SMALL BOWEL RESECTION X2  WITH PRIMARY ANASTOMOSIS X2  04-18-2011   SMALL BOWEL OBSTRUCTION  . GLAUCOMA SURGERY  1978  . HERNIA REPAIR    . INCISION AND DRAINAGE OF WOUND N/A 09/13/2012   Procedure: INCISION  AND DEBRIDEMENT WOUND abdominal wall ;  Surgeon: Rolm Bookbinder, MD;  Location: [place];  Service: General;  Laterality: N/A;  coordination with Dr Migdalia Dk   . KNEE ARTHROSCOPY W/ MENISCECTOMY  06-05-2004  . THYROIDECTOMY  1978 GOITOR   AND CERVICAL COLD KNIFE CONE BX  . TONSILLECTOMY  AGE 58  . TRANSTHORACIC ECHOCARDIOGRAM  12-27-2010   EF 45-50%/ MODERATE MV REGURG. / LEFT ATRIUM MILDLY DILATED/ MILD TRICUSPID REGURG.  .  TUBAL LIGATION    . VENTRAL HERNIA REPAIR  X4  LAST ONE 20 YRS AGO   ABDOMINAL --  EACH ONE IN DIFFERENT AREA  . VENTRAL HERNIA REPAIR  01/29/2012   Procedure: HERNIA REPAIR VENTRAL ADULT;  Surgeon: Theodoro Kos, DO;  Location: [place];  Service: Plastics;  Laterality: N/A;  . WOUND DEBRIDEMENT  09/25/2011   Procedure: DEBRIDEMENT CLOSURE/ABDOMINAL WOUND;  Surgeon: Theodoro Kos, DO;  Location: [place];  Service: Plastics;  Laterality: N/A;  excision of abdominal wound with primary closure     reports that she quit smoking about 7 years ago. Her smoking use included cigarettes. She started smoking about 53 years ago. She has a 67.50 pack-year smoking history. She has never used smokeless tobacco. She reports that she does not drink alcohol or use drugs.  Allergies  Allergen Reactions  . Aspirin Other (See Comments)    HX Bleeding Ulcer   . Nsaids Other (See Comments)    Hx of bleeding ulcers  . Tolmetin Rash    Hx of bleeding ulcers    Family History  Problem Relation Age of Onset  . Stroke Brother   . Diabetes Brother   . Tuberculosis Father   . Stroke Mother   . Stroke Sister   . Heart disease Sister   . Emphysema Sister   . Diabetes Daughter      Prior to Admission medications   Medication Sig Start Date End Date Taking? Authorizing Provider  busPIRone (BUSPAR) 5 MG tablet Take 1 tablet (5 mg total) by mouth 2 (two) times daily. 06/07/18  Yes [name], MD  carvedilol (COREG) 12.5 MG tablet TAKE 1 TABLET BY MOUTH TWICE DAILY WITH A MEAL. Patient taking differently: Take 12.5 mg by mouth 2 (two) times daily with a meal.  01/14/18  Yes Burtis Junes, NP  citalopram (CELEXA) 20 MG tablet TAKE 1 TABLET EACH DAY. Patient taking differently: Take 20 mg by mouth daily.  06/28/18  Yes [name], MD  famotidine (PEPCID) 20 MG tablet Take 20 mg by mouth 2 (two) times daily.   Yes [provider]  levothyroxine (SYNTHROID,  LEVOTHROID) 75 MCG tablet TAKE 1 TABLET ONCE DAILY BEFORE BREAKFAST. Patient taking differently: Take 75 mcg by mouth daily before breakfast.  11/09/17  Yes Wardell Honour, MD  losartan (COZAAR) 25 MG tablet TAKE 1 TABLET BY MOUTH DAILY. Patient taking differently: Take 25 mg by mouth daily.  01/04/18  Yes Burtis Junes, NP  mirtazapine (REMERON) 7.5 MG tablet TAKE ONE TABLET AT BEDTIME. Patient taking differently: Take 7.5 mg by mouth at bedtime.  07/16/18  Yes [name], MD  Probiotic Product (PRO-BIOTIC BLEND PO) Take 1 tablet by mouth daily.   Yes [provider]  spironolactone (ALDACTONE) 25  MG tablet TAKE 1/2 TABLET AT BEDTIME. Patient taking differently: Take 12.5 mg by mouth daily.  06/01/18  Yes Burtis Junes, NP  atorvastatin (LIPITOR) 10 MG tablet Take 1 tablet (10 mg total) by mouth daily. 06/15/18   [name], MD  furosemide (LASIX) 20 MG tablet Take 1 tablet (20 mg total) by mouth daily as needed for fluid or edema. 12/21/17   Wardell Honour, MD  HYDROcodone-acetaminophen (NORCO/VICODIN) 5-325 MG tablet Take 1 tablet by mouth 5 (five) times daily.     [provider]  pantoprazole (PROTONIX) 40 MG tablet TAKE 1 TABLET ONCE DAILY. Patient taking differently: Take 40 mg by mouth daily.  12/03/17   Wardell Honour, MD  PROAIR HFA 108 [phone] Base) MCG/ACT inhaler USE 2 PUFFS EVERY 6 HOURS AS NEEDED FOR SHORTNESS OF BREATH AND WHEEZING. Patient taking differently: Inhale 2 puffs into the lungs every 6 (six) hours as needed for wheezing or shortness of breath.  07/07/17   Wardell Honour, MD  Spacer/Aero-Holding Josiah Lobo (AEROCHAMBER MV) inhaler Use as instructed 05/01/16   Javier Glazier, MD    Physical Exam: Vitals:   08/10/18 1830 08/10/18 1836 08/10/18 1900 08/10/18 1915  BP: (!) 85/58 (!) 79/34 (!) 94/59 (!) 114/59  Pulse: 94  (!) 27   Resp: 17  17 17   SpO2: 98%  99%   Weight:      Height:        Constitutional: NAD, calm  Eyes: PERTLA,  lids and conjunctivae normal ENMT: Mucous membranes are moist. Posterior pharynx clear of any exudate or lesions.   Neck: normal, supple, no masses, no thyromegaly Respiratory: clear to auscultation bilaterally, no wheezing, no crackles. Normal respiratory effort.   Cardiovascular: S1 & S2 heard, regular rate and rhythm. No extremity edema.   Abdomen: No distension, no tenderness, soft. Bowel sounds normal.  Musculoskeletal: no clubbing / cyanosis. No joint deformity upper and lower extremities.    Skin: no significant rashes, lesions, ulcers. Warm, dry, well-perfused. Neurologic: no facial asymmetry. Gross hearing deficit. Sensation intact. Moving all extremities.  Psychiatric: Alert and oriented to person, place, and situation. Pleasant and cooperative.     Labs on Admission: I have personally reviewed following labs and imaging studies  CBC: Recent Labs  Lab 08/10/18 1848 08/10/18 1859  WBC 6.3  --   NEUTROABS 4.2  --   HGB 13.2 12.2  HCT 37.4 36.0  MCV 90.8  --   PLT 269  --    Basic Metabolic Panel: Recent Labs  Lab 08/10/18 1848 08/10/18 1859  NA 124* 121*  K 2.3* 2.4*  CL 85*  --   CO2 11*  --   GLUCOSE 93  --   BUN 104*  --   CREATININE 6.19*  --   CALCIUM 6.4*  --   MG 2.5*  --    GFR: Estimated Creatinine Clearance: 8.6 mL/min (A) (by C-G formula based on SCr of 6.19 mg/dL (H)). Liver Function Tests: Recent Labs  Lab 08/10/18 1848  AST 12*  ALT 11  ALKPHOS 70  BILITOT 1.4*  PROT 6.8  ALBUMIN 3.7   No results for input(s): LIPASE, AMYLASE in the last 168 hours. No results for input(s): AMMONIA in the last 168 hours. Coagulation Profile: No results for input(s): INR, PROTIME in the last 168 hours. Cardiac Enzymes: Recent Labs  Lab 08/10/18 1848  TROPONINI 0.04*   BNP (last 3 results) No results for input(s): PROBNP in the last 8760  hours. HbA1C: No results for input(s): HGBA1C in the last 72 hours. CBG: No results for input(s): GLUCAP in  the last 168 hours. Lipid Profile: No results for input(s): CHOL, HDL, LDLCALC, TRIG, CHOLHDL, LDLDIRECT in the last 72 hours. Thyroid Function Tests: No results for input(s): TSH, T4TOTAL, FREET4, T3FREE, THYROIDAB in the last 72 hours. Anemia Panel: No results for input(s): VITAMINB12, FOLATE, FERRITIN, TIBC, IRON, RETICCTPCT in the last 72 hours. Urine analysis:    Component Value Date/Time   COLORURINE YELLOW 08/10/2018 1955   APPEARANCEUR CLEAR 08/10/2018 1955   APPEARANCEUR Clear 12/21/2017 1824   LABSPEC 1.015 08/10/2018 1955   PHURINE 5.0 08/10/2018 1955   GLUCOSEU NEGATIVE 08/10/2018 1955   HGBUR NEGATIVE 08/10/2018 1955   BILIRUBINUR NEGATIVE 08/10/2018 1955   BILIRUBINUR Negative 12/21/2017 1824   KETONESUR NEGATIVE 08/10/2018 1955   PROTEINUR NEGATIVE 08/10/2018 1955   UROBILINOGEN 0.2 12/23/2016 1317   UROBILINOGEN 0.2 05/27/2011 1835   NITRITE NEGATIVE 08/10/2018 1955   LEUKOCYTESUR NEGATIVE 08/10/2018 1955   Sepsis Labs: @LABRCNTIP (procalcitonin:4,lacticidven:4) )No results found for this or any previous visit (from the past 240 hour(s)).   Radiological Exams on Admission: Dg Chest 1 View  Result Date: 08/10/2018 CLINICAL DATA:  Weakness and dizziness. Nausea. EXAM: CHEST  1 VIEW COMPARISON:  08/30/2015 FINDINGS: Mild cardiac enlargement. No vascular congestion, edema, or consolidation. No blunting of costophrenic angles. No pneumothorax. Mediastinal contours appear intact. IMPRESSION: Mild cardiac enlargement. No evidence of active pulmonary disease. Electronically Signed   By: Lucienne Capers M.D.   On: 08/10/2018 19:06    EKG: Independently reviewed. Atrial fibrillation, PVC's, repolarization abnormality, QTc 527 ms.    Assessment/Plan   1. Acute renal failure; metabolic acidosis   - Presents with generalized weakness and fatigue after several days of diarrhea which is now resolved  - She is found to have BUN 104, SCr 6.19, and bicarb 11 with AG 28  -  Likely prerenal azotemia in setting of hypovolemia with hypotension; ATN possible; ARB and diuretic use likely contributing  - Treated with a liter of NS and a liter of LR in ED  - Repeat chem panel q8h for now, continue IVF with bicarbonate infusion, check urine chemistries and renal US, renally-dose medications, hold ARB and diuretics   2. Hypotension  - Secondary to hypovolemia and resolved with fluid-resuscitation in ED  - Continue to hold antihypertensives and diuretics, continue IVF hydration    3. Hyponatremia; hypokalemia; hypocalcemia   - Serum sodium is 124, potassium is 2.3, and calcium is 6.4 on admission with normal albumin  - Secondary to recent diarrhea with hypovolemia and renal failure  - Treated with 40 mEq oral potassium and 2 g IV calcium gluconate in ED  - Repeat chem panel, continue cardiac monitoring    4. Elevated troponin  - Troponin is slightly elevated in ED without angina  - Likely demand ischemia in setting of hypotension  - Continue cardiac monitoring, trend troponin, repeat EKG in am    5. Hypothyroidism  - Check TSH, continue Synthroid    6. Depression, anxiety  - Stable, continue Celexa, hold Remeron and Buspar until renal function improves    7. Prolonged QT interval  - QTc interval 527 ms on admission  - Secondary to electrolyte derangements  - Given potassium and calcium on admission  - Continue cardiac monitoring, correct electrolytes, minimize QT-prolonging medications, repeat EKG in am    8. Atrial fibrillation  - In a fib in ED - Her son reports  she has had this before in the setting of acute illness  - CHADS-VASc is at least 60 (age x2, gender, HTN)   - Treat underlying illness, correct electrolytes, continue cardiac monitoring    DVT prophylaxis: sq heparin  Code Status: Full   Family Communication: Son updated at bedside Consults called: none Admission status: Inpatient     Vianne Bulls, MD [facility] Pager  [phone]  If 7PM-7AM, please contact night-coverage [email or web address] Password [facility]  08/10/2018, 8:50 PM

## 2018-08-10 NOTE — Patient Instructions (Addendum)
Return to see Dr. Pamella Pert sometime after you are released from the hospital.    If you have lab work done today you will be contacted with your lab results within the next 2 weeks.  If you have not heard from [place] then please contact us. The fastest way to get your results is to register for My Chart.   IF you received an x-ray today, you will receive an invoice from [facility]. Please contact [facility] at [phone] with questions or concerns regarding your invoice.   IF you received labwork today, you will receive an invoice from [facility]. Please contact [facility] at [phone] with questions or concerns regarding your invoice.   Our billing staff will not be able to assist you with questions regarding bills from these companies.  You will be contacted with the lab results as soon as they are available. The fastest way to get your results is to activate your My Chart account. Instructions are located on the last page of this paperwork. If you have not heard from [place] regarding the results in 2 weeks, please contact this office.

## 2018-08-10 NOTE — ED Provider Notes (Signed)
Home EMERGENCY DEPARTMENT Provider Note   CSN: [id number] Arrival date & time: 08/10/18  1753    History   Chief Complaint Chief Complaint  Patient presents with  . Weakness    HPI Teresa [name] is a 78 y.o. female.     Patient is a 78 year old female with past medical history of hypertension, hypothyroidism, A. fib, mitral regurg, CHF, polymyalgia rheumatica, GERD who presents the emergency department for weakness.  She reports that this is been going on for about 4 days.  She went to her primary care doctor today and they found her to be hypotensive with systolic pressures in the 70s so they sent her to the emergency department.  Patient's son states that last week she had several episodes of nonbloody diarrhea.  Patient states that her bowel movements have returned to her normal of 2 bowel movements a day.  Denies recent antibiotic use or travel.  Denies any abdominal pain, nausea, vomiting, fever, chills, URI symptoms or flu symptoms.  She does endorse having some urinary urgency but no burning with urination.  She is not taking anticoagulation.       Past Medical History:  Diagnosis Date  . Abdominal pain   . Anxiety    maintained on Xanax tid for years.  . Arthritis    FEET, KNEES, HANDS  . Chronic systolic CHF (congestive heart failure), NYHA class 2 ([place])    EF 25% 2011 MV,  50-55% echo 6/13  . Diabetes mellitus type 2, diet-controlled ([place])   . GERD (gastroesophageal reflux disease)   . Glaucoma    s/p surgery B in 30s.  [place].  . H/O hiatal hernia   . History of glaucoma   . History of small bowel obstruction 2009 AND NOV 2012  . HTN (hypertension)   . Hypothyroidism   . Left ovarian cyst   . Moderate mitral regurgitation   . Moderate tricuspid regurgitation   . Non-healing surgical wound    ABDOMINAL S/P EXPLORATOY LAPAROTOMY 04-18-2011  . Nonischemic cardiomyopathy ([place]) DR [facility]    EF is 25% per echo February 2012, non  ischemic myoview 12/2009.  EF 50% echo 7/12 and plans for ICD cancelled.   . Peptic ulcer disease   . PVC (premature ventricular contraction)   . Shortness of breath   . Spinal stenosis   . Urge urinary incontinence   . Valvular heart disease    Moderate to severe MR, moderate TR, LAE per echo 7/11  . Wears dentures    upper denture-lower partial    Patient Active Problem List   Diagnosis Date Noted  . Acute renal failure (ARF) ([place]) 08/10/2018  . Hypotension due to hypovolemia 08/10/2018  . Hypokalemia 08/10/2018  . Hyponatremia 08/10/2018  . Metabolic acidosis, increased anion gap 08/10/2018  . Hypocalcemia 08/10/2018  . Elevated troponin 08/10/2018  . Prolonged QT interval 08/10/2018  . Unspecified atrial fibrillation ([place]) 08/10/2018  . Pancreatic pseudocyst 11/09/2017  . Polymyalgia rheumatica ([place]) 10/12/2016  . Anxiety and depression 10/12/2016  . Bilateral hip pain 06/24/2016  . Chronic pain of both shoulders 06/24/2016  . Psychophysiological insomnia 05/12/2016  . Spinal stenosis   . History of glaucoma   . History of small bowel obstruction   . Peptic ulcer disease   . Moderate mitral regurgitation   . Moderate tricuspid regurgitation   . H/O hiatal hernia   . Urge urinary incontinence   . Left ovarian cyst   . Diabetes mellitus type 2,  diet-controlled ([place])   . Shortness of breath   . Wears dentures   . Hypothyroidism   . Generalized anxiety disorder 09/15/2013  . Hernia, incisional 05/10/2012  . Abdominal wall hernia 02/10/2012  . Dyslipidemia 05/01/2011  . GERD (gastroesophageal reflux disease) 05/01/2011  . Osteoarthritis 05/01/2011  . Chronic renal insufficiency 05/01/2011  . HLD (hyperlipidemia) 05/01/2011  . SBO (small bowel obstruction), s/p expl lap 04/24/2011  . Nonischemic cardiomyopathy ([place])   . Arthritis of ankle or foot, degenerative 07/12/2010  . Arthritis of hand, degenerative 07/12/2010  . Malaise and fatigue 07/12/2010  . Essential  (primary) hypertension 07/12/2010  . Glaucoma 07/12/2010    Past Surgical History:  Procedure Laterality Date  . ABDOMINAL HERNIA REPAIR  32 YRS AGO   PERITINITIS  . APPENDECTOMY  1979   RUPTURED  . BENIGN RIGHT BREAST TUMOR REMOVED    . CARDIOVASCULAR STRESS TEST  JULY 2011-  DR HOCHREIN   NO ISCHEMIA  . EXPLORATOMY LAP. / EXTENSIVE LYSIS ADHESIONS/ SMALL BOWEL RESECTION X2 WITH PRIMARY ANASTOMOSIS X2  04-18-2011   SMALL BOWEL OBSTRUCTION  . GLAUCOMA SURGERY  1978  . HERNIA REPAIR    . INCISION AND DRAINAGE OF WOUND N/A 09/13/2012   Procedure: INCISION  AND DEBRIDEMENT WOUND abdominal wall ;  Surgeon: Rolm Bookbinder, MD;  Location: [facility];  Service: General;  Laterality: N/A;  coordination with Dr Migdalia Dk   . KNEE ARTHROSCOPY W/ MENISCECTOMY  06-05-2004  . THYROIDECTOMY  1978 GOITOR   AND CERVICAL COLD KNIFE CONE BX  . TONSILLECTOMY  AGE 44  . TRANSTHORACIC ECHOCARDIOGRAM  12-27-2010   EF 45-50%/ MODERATE MV REGURG. / LEFT ATRIUM MILDLY DILATED/ MILD TRICUSPID REGURG.  . TUBAL LIGATION    . VENTRAL HERNIA REPAIR  X4  LAST ONE 20 YRS AGO   ABDOMINAL --  EACH ONE IN DIFFERENT AREA  . VENTRAL HERNIA REPAIR  01/29/2012   Procedure: HERNIA REPAIR VENTRAL ADULT;  Surgeon: Theodoro Kos, DO;  Location: [facility];  Service: Plastics;  Laterality: N/A;  . WOUND DEBRIDEMENT  09/25/2011   Procedure: DEBRIDEMENT CLOSURE/ABDOMINAL WOUND;  Surgeon: Theodoro Kos, DO;  Location: [facility];  Service: Plastics;  Laterality: N/A;  excision of abdominal wound with primary closure     OB History   No obstetric history on file.      Home Medications    Prior to Admission medications   Medication Sig Start Date End Date Taking? Authorizing Provider  busPIRone (BUSPAR) 5 MG tablet Take 1 tablet (5 mg total) by mouth 2 (two) times daily. 06/07/18  Yes [name], MD  carvedilol (COREG) 12.5 MG tablet TAKE 1 TABLET BY MOUTH TWICE DAILY WITH A MEAL. Patient  taking differently: Take 12.5 mg by mouth 2 (two) times daily with a meal.  01/14/18  Yes Burtis Junes, NP  citalopram (CELEXA) 20 MG tablet TAKE 1 TABLET EACH DAY. Patient taking differently: Take 20 mg by mouth daily.  06/28/18  Yes [name], MD  famotidine (PEPCID) 20 MG tablet Take 20 mg by mouth 2 (two) times daily.   Yes [provider]  levothyroxine (SYNTHROID, LEVOTHROID) 75 MCG tablet TAKE 1 TABLET ONCE DAILY BEFORE BREAKFAST. Patient taking differently: Take 75 mcg by mouth daily before breakfast.  11/09/17  Yes Wardell Honour, MD  losartan (COZAAR) 25 MG tablet TAKE 1 TABLET BY MOUTH DAILY. Patient taking differently: Take 25 mg by mouth daily.  01/04/18  Yes Burtis Junes, NP  mirtazapine (REMERON) 7.5 MG tablet TAKE ONE TABLET AT BEDTIME. Patient taking differently: Take 7.5 mg by mouth at bedtime.  07/16/18  Yes [name], MD  Probiotic Product (PRO-BIOTIC BLEND PO) Take 1 tablet by mouth daily.   Yes [provider]  spironolactone (ALDACTONE) 25 MG tablet TAKE 1/2 TABLET AT BEDTIME. Patient taking differently: Take 12.5 mg by mouth daily.  06/01/18  Yes Burtis Junes, NP  atorvastatin (LIPITOR) 10 MG tablet Take 1 tablet (10 mg total) by mouth daily. 06/15/18   [name], MD  furosemide (LASIX) 20 MG tablet Take 1 tablet (20 mg total) by mouth daily as needed for fluid or edema. 12/21/17   Wardell Honour, MD  HYDROcodone-acetaminophen (NORCO/VICODIN) 5-325 MG tablet Take 1 tablet by mouth 5 (five) times daily.     [provider]  pantoprazole (PROTONIX) 40 MG tablet TAKE 1 TABLET ONCE DAILY. Patient taking differently: Take 40 mg by mouth daily.  12/03/17   Wardell Honour, MD  PROAIR HFA [phone] Base) MCG/ACT inhaler USE 2 PUFFS EVERY 6 HOURS AS NEEDED FOR SHORTNESS OF BREATH AND WHEEZING. Patient taking differently: Inhale 2 puffs into the lungs every 6 (six) hours as needed for wheezing or shortness of breath.  07/07/17    Wardell Honour, MD  Spacer/Aero-Holding Chambers (AEROCHAMBER MV) inhaler Use as instructed 05/01/16   Javier Glazier, MD    Family History Family History  Problem Relation Age of Onset  . Stroke Brother   . Diabetes Brother   . Tuberculosis Father   . Stroke Mother   . Stroke Sister   . Heart disease Sister   . Emphysema Sister   . Diabetes Daughter     Social History Social History   Tobacco Use  . Smoking status: Former Smoker    Packs/day: 1.50    Years: 45.00    Pack years: 67.50    Types: Cigarettes    Start date: 11/14/1964    Last attempt to quit: 09/22/2010    Years since quitting: 7.8  . Smokeless tobacco: Never Used  . Tobacco comment: quit 2011 or 2012 - Didn't smoke during pregnancies  Substance Use Topics  . Alcohol use: No  . Drug use: No     Allergies   Aspirin; Nsaids; and Tolmetin   Review of Systems Review of Systems  Constitutional: Positive for appetite change and fatigue. Negative for activity change, chills, diaphoresis, fever and unexpected weight change.  HENT: Negative for congestion, ear pain, rhinorrhea, sinus pain and sore throat.   Eyes: Negative for pain and visual disturbance.  Respiratory: Negative for cough, chest tightness and shortness of breath.   Cardiovascular: Negative for chest pain and palpitations.  Gastrointestinal: Positive for diarrhea. Negative for abdominal pain, nausea and vomiting.  Genitourinary: Positive for decreased urine volume and urgency. Negative for dysuria, hematuria and pelvic pain.  Musculoskeletal: Negative for arthralgias and back pain.  Skin: Negative for color change and rash.  Allergic/Immunologic: Negative for immunocompromised state.  Neurological: Positive for light-headedness. Negative for dizziness, seizures and syncope.  Psychiatric/Behavioral: Negative for confusion.  All other systems reviewed and are negative.    Physical Exam Updated Vital Signs BP (!) 114/59   Pulse (!) 27    Resp 17   Ht 5\' 3"  (1.6 m)   Wt 99.8 kg   SpO2 99%   BMI 38.97 kg/m   Physical Exam Constitutional:      General: She is not in acute  distress.    Appearance: She is not ill-appearing, toxic-appearing or diaphoretic.  HENT:     Mouth/Throat:     Mouth: Mucous membranes are dry.     Pharynx: Oropharynx is clear. No oropharyngeal exudate or posterior oropharyngeal erythema.  Eyes:     Conjunctiva/sclera: Conjunctivae normal.  Cardiovascular:     Rate and Rhythm: Tachycardia present. Rhythm irregular.  Pulmonary:     Effort: Pulmonary effort is normal. No respiratory distress.     Breath sounds: Normal breath sounds. No stridor. No wheezing, rhonchi or rales.  Chest:     Chest wall: No tenderness.  Abdominal:     General: Abdomen is flat. Bowel sounds are normal. There is no distension.     Tenderness: There is no abdominal tenderness.  Skin:    General: Skin is warm.  Neurological:     General: No focal deficit present.     Mental Status: She is alert.  Psychiatric:        Mood and Affect: Mood normal.      ED Treatments / Results  Labs (all labs ordered are listed, but only abnormal results are displayed) Labs Reviewed  MAGNESIUM - Abnormal; Notable for the following components:      Result Value   Magnesium 2.5 (*)    All other components within normal limits  COMPREHENSIVE METABOLIC PANEL - Abnormal; Notable for the following components:   Sodium 124 (*)    Potassium 2.3 (*)    Chloride 85 (*)    CO2 11 (*)    BUN 104 (*)    Creatinine, Ser 6.19 (*)    Calcium 6.4 (*)    AST 12 (*)    Total Bilirubin 1.4 (*)    GFR calc non Af Amer 6 (*)    GFR calc Af Amer 7 (*)    Anion gap 28 (*)    All other components within normal limits  TROPONIN I - Abnormal; Notable for the following components:   Troponin I 0.04 (*)    All other components within normal limits  POCT I-STAT EG7 - Abnormal; Notable for the following components:   pCO2, Ven 28.8 (*)     Bicarbonate 14.5 (*)    TCO2 15 (*)    Acid-base deficit 10.0 (*)    Sodium 121 (*)    Potassium 2.4 (*)    Calcium, Ion 0.76 (*)    All other components within normal limits  LACTIC ACID, PLASMA  APTT  CBC WITH DIFFERENTIAL/PLATELET  URINALYSIS, ROUTINE W REFLEX MICROSCOPIC  BASIC METABOLIC PANEL  CBC WITH DIFFERENTIAL/PLATELET  TSH  SODIUM, URINE, RANDOM  CREATININE, URINE, RANDOM  UREA NITROGEN, URINE  TROPONIN I  TROPONIN I  PARATHYROID HORMONE, INTACT (NO CA)  BASIC METABOLIC PANEL    EKG EKG Interpretation  Date/Time:  Tuesday August 10 2018 17:56:28 EST Ventricular Rate:  109 PR Interval:    QRS Duration: 108 QT Interval:  391 QTC Calculation: 527 R Axis:   -25 Text Interpretation:  Atrial fibrillation Ventricular premature complex Borderline left axis deviation RSR' in V1 or V2, right VCD or RVH Repol abnrm suggests ischemia, anterolateral Prolonged QT interval TWI in the lateral leads are new Confirmed by Varney Biles [phone]) on 08/10/2018 6:38:37 PM   Radiology Dg Chest 1 View  Result Date: 08/10/2018 CLINICAL DATA:  Weakness and dizziness. Nausea. EXAM: CHEST  1 VIEW COMPARISON:  08/30/2015 FINDINGS: Mild cardiac enlargement. No vascular congestion, edema, or consolidation. No blunting of costophrenic angles.  No pneumothorax. Mediastinal contours appear intact. IMPRESSION: Mild cardiac enlargement. No evidence of active pulmonary disease. Electronically Signed   By: Lucienne Capers M.D.   On: 08/10/2018 19:06    Procedures Procedures (including critical care time)  Medications Ordered in ED Medications  atorvastatin (LIPITOR) tablet 10 mg (has no administration in time range)  levothyroxine (SYNTHROID, LEVOTHROID) tablet 75 mcg (has no administration in time range)  citalopram (CELEXA) tablet 20 mg (has no administration in time range)  famotidine (PEPCID) tablet 10 mg (has no administration in time range)  pantoprazole (PROTONIX) EC tablet 40 mg  (has no administration in time range)  albuterol (PROVENTIL HFA;VENTOLIN HFA) 108 (90 Base) MCG/ACT inhaler 2 puff (has no administration in time range)  heparin injection 5,000 Units (has no administration in time range)  sodium chloride flush (NS) 0.9 % injection 3 mL (has no administration in time range)  acetaminophen (TYLENOL) tablet 650 mg (has no administration in time range)    Or  acetaminophen (TYLENOL) suppository 650 mg (has no administration in time range)  HYDROcodone-acetaminophen (NORCO/VICODIN) 5-325 MG per tablet 1 tablet (has no administration in time range)  sodium bicarbonate 150 mEq in dextrose 5 % 1,000 mL infusion (has no administration in time range)  potassium chloride 10 mEq in 100 mL IVPB (has no administration in time range)  calcium gluconate 2 g/ 100 mL sodium chloride IVPB (has no administration in time range)  potassium chloride SA (K-DUR,KLOR-CON) CR tablet 40 mEq (has no administration in time range)  lactated ringers bolus 1,000 mL (0 mLs Intravenous Stopped 08/10/18 2002)  potassium chloride SA (K-DUR,KLOR-CON) CR tablet 40 mEq (40 mEq Oral Given 08/10/18 2000)  sodium chloride 0.9 % bolus 1,000 mL (1,000 mLs Intravenous New Bag/Given 08/10/18 2036)     Initial Impression / Assessment and Plan / ED Course  I have reviewed the triage vital signs and the nursing notes.  Pertinent labs & imaging results that were available during my care of the patient were reviewed by me and considered in my medical decision making (see chart for details).  Clinical Course as of Aug 10 2204  Tue Aug 10, 2018  2203 Patient to be admitted for acute renal failure in the setting of dehydration. Also has severe hypokalemia with EKG changes. Patient remains stable at this time. Was given initial 40mg  PO dose of potassium and now ordered a second PO dose. Tolerating well. Continuing to get fluid bolus.    [KM]    Clinical Course User Index [KM] Alveria Apley, PA-C        CRITICAL CARE Performed by: Alveria Apley   Total critical care time: 45 minutes  Critical care time was exclusive of separately billable procedures and treating other patients.  Critical care was necessary to treat or prevent imminent or life-threatening deterioration.  Critical care was time spent personally by me on the following activities: development of treatment plan with patient and/or surrogate as well as nursing, discussions with consultants, evaluation of patient's response to treatment, examination of patient, obtaining history from patient or surrogate, ordering and performing treatments and interventions, ordering and review of laboratory studies, ordering and review of radiographic studies, pulse oximetry and re-evaluation of patient's condition.   Final Clinical Impressions(s) / ED Diagnoses   Final diagnoses:  Acute renal failure, unspecified acute renal failure type [facility])  Dehydration  Acute hypokalemia  Hyponatremia    ED Discharge Orders    None       Madilyn Hook  A, PA-C 08/10/18 2206    Varney Biles, MD 08/11/18 [phone]

## 2018-08-11 DIAGNOSIS — E119 Type 2 diabetes mellitus without complications: Secondary | ICD-10-CM

## 2018-08-11 DIAGNOSIS — M25511 Pain in right shoulder: Secondary | ICD-10-CM

## 2018-08-11 DIAGNOSIS — M25512 Pain in left shoulder: Secondary | ICD-10-CM

## 2018-08-11 DIAGNOSIS — G8929 Other chronic pain: Secondary | ICD-10-CM

## 2018-08-11 LAB — BASIC METABOLIC PANEL
ANION GAP: 21 — AB (ref 5–15)
Anion gap: 15 (ref 5–15)
Anion gap: 12 (ref 5–15)
BUN: 90 mg/dL — ABNORMAL HIGH (ref 8–23)
BUN: 74 mg/dL — ABNORMAL HIGH (ref 8–23)
BUN: 70 mg/dL — ABNORMAL HIGH (ref 8–23)
CHLORIDE: 100 mmol/L (ref 98–111)
CO2: 12 mmol/L — ABNORMAL LOW (ref 22–32)
CO2: 19 mmol/L — ABNORMAL LOW (ref 22–32)
CO2: 21 mmol/L — AB (ref 22–32)
CREATININE: 2.41 mg/dL — AB (ref 0.44–1.00)
Calcium: 6.9 mg/dL — ABNORMAL LOW (ref 8.9–10.3)
Calcium: 6.9 mg/dL — ABNORMAL LOW (ref 8.9–10.3)
Calcium: 6.9 mg/dL — ABNORMAL LOW (ref 8.9–10.3)
Chloride: 93 mmol/L — ABNORMAL LOW (ref 98–111)
Chloride: 98 mmol/L (ref 98–111)
Creatinine, Ser: 4.22 mg/dL — ABNORMAL HIGH (ref 0.44–1.00)
Creatinine, Ser: 2.6 mg/dL — ABNORMAL HIGH (ref 0.44–1.00)
GFR calc Af Amer: 11 mL/min — ABNORMAL LOW (ref >60)
GFR calc Af Amer: 20 mL/min — ABNORMAL LOW (ref >60)
GFR calc non Af Amer: 10 mL/min — ABNORMAL LOW (ref >60)
GFR calc non Af Amer: 19 mL/min — ABNORMAL LOW (ref >60)
GFR, EST AFRICAN AMERICAN: 22 mL/min — AB (ref >60)
GFR, EST NON AFRICAN AMERICAN: 17 mL/min — AB (ref >60)
Glucose, Bld: 104 mg/dL — ABNORMAL HIGH (ref 70–99)
Glucose, Bld: 96 mg/dL (ref 70–99)
Glucose, Bld: 130 mg/dL — ABNORMAL HIGH (ref 70–99)
Potassium: 3 mmol/L — ABNORMAL LOW (ref 3.5–5.1)
Potassium: 2.5 mmol/L — CL (ref 3.5–5.1)
Potassium: 2.9 mmol/L — ABNORMAL LOW (ref 3.5–5.1)
Sodium: 126 mmol/L — ABNORMAL LOW (ref 135–145)
Sodium: 132 mmol/L — ABNORMAL LOW (ref 135–145)
Sodium: 133 mmol/L — ABNORMAL LOW (ref 135–145)

## 2018-08-11 LAB — GASTROINTESTINAL PANEL BY PCR, STOOL (REPLACES STOOL CULTURE)
ADENOVIRUS F40/41: NOT DETECTED
ASTROVIRUS: NOT DETECTED
CRYPTOSPORIDIUM: NOT DETECTED
CYCLOSPORA CAYETANENSIS: NOT DETECTED
Campylobacter species: NOT DETECTED
ENTEROTOXIGENIC E COLI (ETEC): NOT DETECTED
Entamoeba histolytica: NOT DETECTED
Enteroaggregative E coli (EAEC): NOT DETECTED
Enteropathogenic E coli (EPEC): NOT DETECTED
Giardia lamblia: NOT DETECTED
Norovirus GI/GII: NOT DETECTED
Plesimonas shigelloides: NOT DETECTED
Rotavirus A: NOT DETECTED
Salmonella species: NOT DETECTED
Sapovirus (I, II, IV, and V): NOT DETECTED
Shiga like toxin producing E coli (STEC): NOT DETECTED
Shigella/Enteroinvasive E coli (EIEC): NOT DETECTED
VIBRIO SPECIES: NOT DETECTED
Vibrio cholerae: NOT DETECTED
YERSINIA ENTEROCOLITICA: NOT DETECTED

## 2018-08-11 LAB — CBC WITH DIFFERENTIAL/PLATELET
Abs Immature Granulocytes: 0.03 10*3/uL (ref 0.00–0.07)
Basophils Absolute: 0 10*3/uL (ref 0.0–0.1)
Basophils Relative: 1 %
Eosinophils Absolute: 0 10*3/uL (ref 0.0–0.5)
Eosinophils Relative: 1 %
HCT: 30.6 % — ABNORMAL LOW (ref 36.0–46.0)
Hemoglobin: 10.9 g/dL — ABNORMAL LOW (ref 12.0–15.0)
IMMATURE GRANULOCYTES: 1 %
LYMPHS PCT: 32 %
Lymphs Abs: 1.5 10*3/uL (ref 0.7–4.0)
MCH: 32.7 pg (ref 26.0–34.0)
MCHC: 35.6 g/dL (ref 30.0–36.0)
MCV: 91.9 fL (ref 80.0–100.0)
Monocytes Absolute: 0.7 10*3/uL (ref 0.1–1.0)
Monocytes Relative: 14 %
NEUTROS ABS: 2.4 10*3/uL (ref 1.7–7.7)
NEUTROS PCT: 51 %
PLATELETS: 198 10*3/uL (ref 150–400)
RBC: 3.33 MIL/uL — ABNORMAL LOW (ref 3.87–5.11)
RDW: 12.3 % (ref 11.5–15.5)
WBC: 4.7 10*3/uL (ref 4.0–10.5)
nRBC: 0 % (ref 0.0–0.2)

## 2018-08-11 LAB — TROPONIN I
Troponin I: 0.03 ng/mL (ref <0.03)
Troponin I: 0.03 ng/mL (ref <0.03)
Troponin I: <0.03 ng/mL (ref <0.03)

## 2018-08-11 LAB — TSH: TSH: 0.812 u[IU]/mL (ref 0.350–4.500)

## 2018-08-11 LAB — CBG MONITORING, ED: Glucose-Capillary: 114 mg/dL — ABNORMAL HIGH (ref 70–99)

## 2018-08-11 LAB — SODIUM, URINE, RANDOM: Sodium, Ur: 18 mmol/L

## 2018-08-11 LAB — C DIFFICILE QUICK SCREEN W PCR REFLEX
C Diff antigen: NEGATIVE
C Diff interpretation: NOT DETECTED
C Diff toxin: NEGATIVE

## 2018-08-11 LAB — CREATININE, URINE, RANDOM: Creatinine, Urine: 185.39 mg/dL

## 2018-08-11 MED ORDER — HYDROCODONE-ACETAMINOPHEN 5-325 MG PO TABS
1.0000 | ORAL_TABLET | Freq: Every day | ORAL | Status: DC
  Administered 2018-08-11 – 2018-08-12 (×5): 1 via ORAL
  Filled 2018-08-11 (×5): qty 1

## 2018-08-11 MED ORDER — DIPHENHYDRAMINE HCL 25 MG PO CAPS
25.0000 mg | ORAL_CAPSULE | Freq: Every evening | ORAL | Status: DC | PRN
  Administered 2018-08-11: 25 mg via ORAL
  Filled 2018-08-11: qty 1

## 2018-08-11 MED ORDER — CARVEDILOL 12.5 MG PO TABS: 12.5000 mg | ORAL_TABLET | Freq: Two times a day (BID) | ORAL | Status: DC

## 2018-08-11 MED ORDER — DOXYLAMINE SUCCINATE (SLEEP) 25 MG PO TABS: 25.0000 mg | ORAL_TABLET | Freq: Every evening | ORAL | Status: DC | PRN

## 2018-08-11 MED ORDER — METOPROLOL TARTRATE 5 MG/5ML IV SOLN
2.5000 mg | Freq: Four times a day (QID) | INTRAVENOUS | Status: DC | PRN
  Administered 2018-08-13: 2.5 mg via INTRAVENOUS
  Filled 2018-08-11: qty 5

## 2018-08-11 MED ORDER — SODIUM CHLORIDE 0.9 % IV SOLN
INTRAVENOUS | Status: DC
  Administered 2018-08-11: 15:00:00 via INTRAVENOUS

## 2018-08-11 MED ORDER — CALCIUM CARBONATE 1250 (500 CA) MG PO TABS
1.0000 | ORAL_TABLET | Freq: Three times a day (TID) | ORAL | Status: AC
  Administered 2018-08-11 – 2018-08-13 (×6): 500 mg via ORAL
  Filled 2018-08-11 (×6): qty 1

## 2018-08-11 MED ORDER — LOPERAMIDE HCL 2 MG PO CAPS
4.0000 mg | ORAL_CAPSULE | Freq: Once | ORAL | Status: AC
  Administered 2018-08-11: 4 mg via ORAL
  Filled 2018-08-11: qty 2

## 2018-08-11 MED ORDER — SODIUM CHLORIDE 0.9 % IV BOLUS
500.0000 mL | Freq: Once | INTRAVENOUS | Status: AC
  Administered 2018-08-11: 500 mL via INTRAVENOUS

## 2018-08-11 MED ORDER — POTASSIUM CHLORIDE CRYS ER 20 MEQ PO TBCR
40.0000 meq | EXTENDED_RELEASE_TABLET | Freq: Three times a day (TID) | ORAL | Status: AC
  Administered 2018-08-11 – 2018-08-12 (×3): 40 meq via ORAL
  Filled 2018-08-11 (×3): qty 2

## 2018-08-11 NOTE — ED Notes (Signed)
Lab requesting redraw lab work. Blood work redrawn and sent to lab.

## 2018-08-11 NOTE — ED Notes (Signed)
Patient continues to c/o abdominal pain and urinary retention.

## 2018-08-11 NOTE — Progress Notes (Signed)
[name]    Progress Note  Teresa [name]  GUY:[id number] DOB: [birth year] DOA: 08/10/2018 PCP: [name], MD     Brief Narrative:   Teresa [name] is an 78 y.o. female past medical history significant for hypothyroidism depression, anxiety presents to the emergency room for evaluation of generalized weakness, comes into the hospital post several episodes of watery diarrhea no fever chills abdominal pain or vomiting.  Assessment/Plan:   Acute renal failure (ARF) (HCC) metabolic acidosis with an increased anion gap: Likely prerenal azotemia in the setting of ARB and diuretic use, with diarrhea.. With a baseline creatinine of less than 1. Discontinue D5 with bicarbonate supplement. Start her on aggressive normal saline, monitor strict I's and O's. Creatinine on admission IV fluids 2.6. Her metabolic acidosis is almost resolved.  Watery  diarrhea: Check a C. difficile. Check a GI panel. Continue IV fluids strict I's and O's.  Severe hypokalemia: Replete orally aggressively give her magnesium orally recheck magnesium in the morning. Likely due to diarrhea.  Hypotension: Fluid hydration. Continue to hold diuretic therapy and ARB.  Hypovolemic hyponatremia: Continue aggressive IV fluid hydration recheck a basic metabolic panel in the morning her sodium is improving very nicely. Likely due to diarrhea.  Hypocalcemia: Replete calcium orally recheck in the morning.  Troponins: She denies any chest pain EKG showed no signs of ischemia. Cardiac biomarkers have remained flat.  Hypothyroidism: Continue Synthroid.  Depression/anxiety: Hold Remeron and BuSpar as they are cleared renally. Continue Celexa.  Prolonged QTc: With low potassium and magnesium these are repleted orally. Repeat EKG in the morning.  Paroxysmal atrial fibrillation: CHADS-VASc is at least 4  He is metoprolol for heart rate greater than 100 IV. She has done this in the  past and converted back to sinus rhythm. She is on no anticoagulation at home.     DVT prophylaxis: heparin Family Communication:none Disposition Plan/Barrier to D/C: home in 2-3 days Code Status:     Code Status Orders  (From admission, onward)         Start     Ordered   08/10/18 2043  Full code  Continuous     08/10/18 2050        Code Status History    Date Active Date Inactive Code Status Order ID Comments User Context   02/04/2013 2124 02/07/2013 1631 Full Code [id number]  Excell Seltzer, MD Inpatient   09/13/2012 1520 09/14/2012 1647 Full Code [id number]  Rolm Bookbinder, MD Inpatient   04/20/2011 0006 05/02/2011 1552 Full Code [id number]  Long, Gilles Chiquito, RN Inpatient        IV Access:    Peripheral IV   Procedures and diagnostic studies:   Dg Chest 1 View  Result Date: 08/10/2018 CLINICAL DATA:  Weakness and dizziness. Nausea. EXAM: CHEST  1 VIEW COMPARISON:  08/30/2015 FINDINGS: Mild cardiac enlargement. No vascular congestion, edema, or consolidation. No blunting of costophrenic angles. No pneumothorax. Mediastinal contours appear intact. IMPRESSION: Mild cardiac enlargement. No evidence of active pulmonary disease. Electronically Signed   By: Lucienne Capers M.D.   On: 08/10/2018 19:06   US Renal  Result Date: 08/10/2018 CLINICAL DATA:  Acute renal failure EXAM: RENAL / URINARY TRACT ULTRASOUND COMPLETE COMPARISON:  MRI 09/30/2016 FINDINGS: Right Kidney: Renal measurements: 9.3 x 4.0 x 4.4 cm = volume: 87 mL . Echogenicity within normal limits. No mass or hydronephrosis visualized. Left Kidney: Renal measurements: 9.9 x 4.3 x 4.0 cm = volume: 91 mL. Echogenicity within normal  limits. No mass or hydronephrosis visualized. Bladder: Appears normal for degree of bladder distention. IMPRESSION: Unremarkable renal ultrasound. Electronically Signed   By: Rolm Baptise M.D.   On: 08/10/2018 22:12     Medical Consultants:    None.  Anti-Infectives:    None  Subjective:    [name] she relates she feels better no further diarrhea.   Objective:    Vitals:   08/11/18 1100 08/11/18 1200 08/11/18 1228 08/11/18 1330  BP: (!) 80/49 (!) 85/50 103/65 107/65  Pulse: 74 88 90 90  Resp: 18 18 (!) 22 (!) 26  Temp:      TempSrc:      SpO2: 98% 98% 99% 91%  Weight:      Height:        Intake/Output Summary (Last 24 hours) at 08/11/2018 1343 Last data filed at 08/11/2018 1227 Gross per 24 hour  Intake 1071.68 ml  Output -  Net 1071.68 ml   Filed Weights   08/10/18 1808  Weight: 99.8 kg    Exam: General exam: In no acute distress. Respiratory system: Good air movement and clear to auscultation. Cardiovascular system: S1 & S2 heard, RRR.  Gastrointestinal system: Abdomen is nondistended, soft and nontender.  Central nervous system: Alert and oriented. No focal neurological deficits. Extremities: No pedal edema. Skin: No rashes, lesions or ulcers Psychiatry: Judgement and insight appear normal. Mood & affect appropriate.    Data Reviewed:    Labs: Basic Metabolic Panel: Recent Labs  Lab 08/10/18 1848 08/10/18 1859 08/11/18 0153  NA 124* 121* 126*  K 2.3* 2.4* 3.0*  CL 85*  --  93*  CO2 11*  --  12*  GLUCOSE 93  --  104*  BUN 104*  --  90*  CREATININE 6.19*  --  4.22*  CALCIUM 6.4*  --  6.9*  MG 2.5*  --   --    GFR Estimated Creatinine Clearance: 12.6 mL/min (A) (by C-G formula based on SCr of 4.22 mg/dL (H)). Liver Function Tests: Recent Labs  Lab 08/10/18 1848  AST 12*  ALT 11  ALKPHOS 70  BILITOT 1.4*  PROT 6.8  ALBUMIN 3.7   No results for input(s): LIPASE, AMYLASE in the last 168 hours. No results for input(s): AMMONIA in the last 168 hours. Coagulation profile No results for input(s): INR, PROTIME in the last 168 hours.  CBC: Recent Labs  Lab 08/10/18 1848 08/10/18 1859 08/11/18 0530  WBC 6.3  --  4.7  NEUTROABS 4.2  --  2.4  HGB 13.2 12.2 10.9*  HCT 37.4 36.0 30.6*  MCV 90.8   --  91.9  PLT 269  --  198   Cardiac Enzymes: Recent Labs  Lab 08/10/18 1848 08/11/18 0152 08/11/18 0530 08/11/18 1227  TROPONINI 0.04* 0.03* 0.03* <0.03   BNP (last 3 results) No results for input(s): PROBNP in the last 8760 hours. CBG: Recent Labs  Lab 08/11/18 0812  GLUCAP 114*   D-Dimer: No results for input(s): DDIMER in the last 72 hours. Hgb A1c: No results for input(s): HGBA1C in the last 72 hours. Lipid Profile: No results for input(s): CHOL, HDL, LDLCALC, TRIG, CHOLHDL, LDLDIRECT in the last 72 hours. Thyroid function studies: Recent Labs    08/11/18 0152  TSH 0.812   Anemia work up: No results for input(s): VITAMINB12, FOLATE, FERRITIN, TIBC, IRON, RETICCTPCT in the last 72 hours. Sepsis Labs: Recent Labs  Lab 08/10/18 1848 08/11/18 0530  WBC 6.3 4.7  LATICACIDVEN 1.2  --  Microbiology No results found for this or any previous visit (from the past 240 hour(s)).   Medications:   . atorvastatin  10 mg Oral q1800  . citalopram  20 mg Oral Daily  . famotidine  10 mg Oral Daily  . heparin  5,000 Units Subcutaneous Q8H  . levothyroxine  75 mcg Oral Q0600  . pantoprazole  40 mg Oral Daily  . sodium chloride flush  3 mL Intravenous Q12H   Continuous Infusions:    LOS: 1 day   Charlynne Cousins  [facility]  08/11/2018, 1:43 PM

## 2018-08-11 NOTE — ED Notes (Signed)
Rectal foley placed

## 2018-08-11 NOTE — ED Notes (Signed)
Anna(SR)-Lunch Tray Ordered @ 1027-per RN-called by Levada Dy

## 2018-08-11 NOTE — ED Notes (Signed)
Breakfast Tray Ordered. 

## 2018-08-11 NOTE — ED Notes (Signed)
Patient continues to have diarrhea.

## 2018-08-12 DIAGNOSIS — L8990 Pressure ulcer of unspecified site, unspecified stage: Secondary | ICD-10-CM

## 2018-08-12 LAB — BASIC METABOLIC PANEL
Anion gap: 9 (ref 5–15)
BUN: 58 mg/dL — ABNORMAL HIGH (ref 8–23)
CHLORIDE: 106 mmol/L (ref 98–111)
CO2: 22 mmol/L (ref 22–32)
Calcium: 7.4 mg/dL — ABNORMAL LOW (ref 8.9–10.3)
Creatinine, Ser: 1.75 mg/dL — ABNORMAL HIGH (ref 0.44–1.00)
GFR calc Af Amer: 32 mL/min — ABNORMAL LOW (ref >60)
GFR calc non Af Amer: 28 mL/min — ABNORMAL LOW (ref >60)
Glucose, Bld: 133 mg/dL — ABNORMAL HIGH (ref 70–99)
Potassium: 3.1 mmol/L — ABNORMAL LOW (ref 3.5–5.1)
Sodium: 137 mmol/L (ref 135–145)

## 2018-08-12 LAB — PARATHYROID HORMONE, INTACT (NO CA): PTH: 216 pg/mL — ABNORMAL HIGH (ref 15–65)

## 2018-08-12 LAB — UREA NITROGEN, URINE: Urea Nitrogen, Ur: 412 mg/dL

## 2018-08-12 LAB — GLUCOSE, CAPILLARY: Glucose-Capillary: 173 mg/dL — ABNORMAL HIGH (ref 70–99)

## 2018-08-12 MED ORDER — CARVEDILOL 12.5 MG PO TABS: 12.5000 mg | ORAL_TABLET | Freq: Two times a day (BID) | ORAL | Status: DC

## 2018-08-12 MED ORDER — HALOPERIDOL LACTATE 5 MG/ML IJ SOLN: 1.0000 mg | Freq: Four times a day (QID) | INTRAMUSCULAR | Status: DC | PRN

## 2018-08-12 MED ORDER — BUSPIRONE HCL 5 MG PO TABS
5.0000 mg | ORAL_TABLET | Freq: Two times a day (BID) | ORAL | Status: DC
  Administered 2018-08-12 – 2018-08-17 (×11): 5 mg via ORAL
  Filled 2018-08-12 (×11): qty 1

## 2018-08-12 MED ORDER — POTASSIUM CHLORIDE CRYS ER 20 MEQ PO TBCR
40.0000 meq | EXTENDED_RELEASE_TABLET | Freq: Two times a day (BID) | ORAL | Status: AC
  Administered 2018-08-12: 40 meq via ORAL
  Filled 2018-08-12: qty 2

## 2018-08-12 MED ORDER — QUETIAPINE FUMARATE 25 MG PO TABS
25.0000 mg | ORAL_TABLET | Freq: Every evening | ORAL | Status: DC | PRN
  Administered 2018-08-12 – 2018-08-16 (×4): 25 mg via ORAL
  Filled 2018-08-12 (×5): qty 1

## 2018-08-12 MED ORDER — MIRTAZAPINE 7.5 MG PO TABS
7.5000 mg | ORAL_TABLET | Freq: Every day | ORAL | Status: DC
  Administered 2018-08-12 – 2018-08-16 (×5): 7.5 mg via ORAL
  Filled 2018-08-12 (×5): qty 1

## 2018-08-12 MED ORDER — LOPERAMIDE HCL 2 MG PO CAPS
2.0000 mg | ORAL_CAPSULE | ORAL | Status: DC | PRN
  Administered 2018-08-12 – 2018-08-13 (×3): 2 mg via ORAL
  Filled 2018-08-12 (×3): qty 1

## 2018-08-12 MED ORDER — CARVEDILOL 12.5 MG PO TABS
12.5000 mg | ORAL_TABLET | Freq: Two times a day (BID) | ORAL | Status: AC
  Administered 2018-08-12 – 2018-08-14 (×5): 12.5 mg via ORAL
  Filled 2018-08-12 (×6): qty 1

## 2018-08-12 MED ORDER — RISAQUAD PO CAPS
ORAL_CAPSULE | Freq: Every day | ORAL | Status: DC
  Administered 2018-08-12 – 2018-08-17 (×6): 1 via ORAL
  Filled 2018-08-12 (×6): qty 1

## 2018-08-12 MED ORDER — PSYLLIUM 95 % PO PACK
1.0000 | PACK | Freq: Every day | ORAL | Status: DC
  Administered 2018-08-13 – 2018-08-17 (×3): 1 via ORAL
  Filled 2018-08-12 (×4): qty 1

## 2018-08-12 NOTE — Progress Notes (Signed)
[name]    Progress Note  Teresa SHERRARD  MBW:[id number] DOB: 1941-04-06 DOA: 08/10/2018 PCP: [name], MD     Brief Narrative:   Teresa [name] is an 78 y.o. female past medical history significant for hypothyroidism depression, anxiety presents to the emergency room for evaluation of generalized weakness, comes into the hospital post several episodes of watery diarrhea no fever chills abdominal pain or vomiting.  Assessment/Plan:   Acute renal failure (ARF) (HCC) metabolic acidosis with an increased anion gap: Likely prerenal azotemia in the setting of ARB and diuretic use, with diarrhea. With a baseline creatinine of less than 1. Continue aggressive normal saline, monitor strict I's and O's. Acidosis is resolved, creatinine continues to improve with IV fluid hydration.  Watery  diarrhea: C. difficile negative, GI panel negative. Continues to have copious amount of diarrhea. Continue IV fluids strict I's and O's. Start Metamucil, and probiotics.  Severe hypokalemia: Potassium continues to improve, continue replete orally recheck a basic metabolic panel in the morning.  Hypotension: Resolved with IV fluids. Continue to hold diuretics and ARB.  Hypovolemic hyponatremia: Resolved. Likely due to diarrhea.  Hypocalcemia: Replete calcium orally recheck in the morning.  Elevated troponins: She denies any chest pain EKG showed no signs of ischemia. Cardiac biomarkers have remained flat.  Hypothyroidism: Continue Synthroid.  Depression/anxiety: Resume BuSpar and Remeron. Continue Celexa.  Prolonged QTc: With low potassium and magnesium these are repleted orally. Repeat EKG in the morning.  Paroxysmal atrial fibrillation: CHADS-VASc is at least 4  Resume her Coreg she is not on anticoagulation at home she is now back in sinus rhythm.  Acute confusional state: Likely due to Benadryl. Avoid narcotics Ativan or Benadryl.  Use Seroquel  at bedtime.      DVT prophylaxis: heparin Family Communication:none Disposition Plan/Barrier to D/C: home in 2  days Code Status:     Code Status Orders  (From admission, onward)         Start     Ordered   08/10/18 2043  Full code  Continuous     08/10/18 2050        Code Status History    Date Active Date Inactive Code Status Order ID Comments User Context   02/04/2013 2124 02/07/2013 1631 Full Code [id number]  Excell Seltzer, MD Inpatient   09/13/2012 1520 09/14/2012 1647 Full Code [id number]  Rolm Bookbinder, MD Inpatient   04/20/2011 0006 05/02/2011 1552 Full Code [id number]  Long, Gilles Chiquito, RN Inpatient        IV Access:    Peripheral IV   Procedures and diagnostic studies:   Dg Chest 1 View  Result Date: 08/10/2018 CLINICAL DATA:  Weakness and dizziness. Nausea. EXAM: CHEST  1 VIEW COMPARISON:  08/30/2015 FINDINGS: Mild cardiac enlargement. No vascular congestion, edema, or consolidation. No blunting of costophrenic angles. No pneumothorax. Mediastinal contours appear intact. IMPRESSION: Mild cardiac enlargement. No evidence of active pulmonary disease. Electronically Signed   By: Lucienne Capers M.D.   On: 08/10/2018 19:06   US Renal  Result Date: 08/10/2018 CLINICAL DATA:  Acute renal failure EXAM: RENAL / URINARY TRACT ULTRASOUND COMPLETE COMPARISON:  MRI 09/30/2016 FINDINGS: Right Kidney: Renal measurements: 9.3 x 4.0 x 4.4 cm = volume: 87 mL . Echogenicity within normal limits. No mass or hydronephrosis visualized. Left Kidney: Renal measurements: 9.9 x 4.3 x 4.0 cm = volume: 91 mL. Echogenicity within normal limits. No mass or hydronephrosis visualized. Bladder: Appears normal for degree of bladder distention.  IMPRESSION: Unremarkable renal ultrasound. Electronically Signed   By: Rolm Baptise M.D.   On: 08/10/2018 22:12     Medical Consultants:    None.  Anti-Infectives:   None  Subjective:    Teresa [name] continues to have watery  diarrhea.   Objective:    Vitals:   08/12/18 0250 08/12/18 0519 08/12/18 0520 08/12/18 0814  BP: 92/72 (!) 90/51 (!) 90/51 112/63  Pulse:    (!) 117  Resp:   16   Temp:      TempSrc:      SpO2:      Weight:      Height:        Intake/Output Summary (Last 24 hours) at 08/12/2018 1031 Last data filed at 08/12/2018 0500 Gross per 24 hour  Intake 2974.18 ml  Output 500 ml  Net 2474.18 ml   Filed Weights   08/10/18 1808 08/11/18 1437  Weight: 99.8 kg 99.8 kg    Exam: General exam: In no acute distress. Respiratory system: Good air movement and clear to auscultation. Cardiovascular system: S1 & S2 heard, RRR.  Gastrointestinal system: Abdomen is nondistended, soft and nontender.  Central nervous system: Alert and oriented. No focal neurological deficits. Extremities: No pedal edema. Skin: No rashes, lesions or ulcers Psychiatry: Judgement and insight appear normal. Mood & affect appropriate.    Data Reviewed:    Labs: Basic Metabolic Panel: Recent Labs  Lab 08/10/18 1848 08/10/18 1859 08/11/18 0153 08/11/18 1308 08/11/18 1814 08/12/18 0928  NA 124* 121* 126* 132* 133* 137  K 2.3* 2.4* 3.0* 2.5* 2.9* 3.1*  CL 85*  --  93* 98 100 106  CO2 11*  --  12* 19* 21* 22  GLUCOSE 93  --  104* 96 130* 133*  BUN 104*  --  90* 74* 70* 58*  CREATININE 6.19*  --  4.22* 2.60* 2.41* 1.75*  CALCIUM 6.4*  --  6.9* 6.9* 6.9* 7.4*  MG 2.5*  --   --   --   --   --    GFR Estimated Creatinine Clearance: 30.3 mL/min (A) (by C-G formula based on SCr of 1.75 mg/dL (H)). Liver Function Tests: Recent Labs  Lab 08/10/18 1848  AST 12*  ALT 11  ALKPHOS 70  BILITOT 1.4*  PROT 6.8  ALBUMIN 3.7   No results for input(s): LIPASE, AMYLASE in the last 168 hours. No results for input(s): AMMONIA in the last 168 hours. Coagulation profile No results for input(s): INR, PROTIME in the last 168 hours.  CBC: Recent Labs  Lab 08/10/18 1848 08/10/18 1859 08/11/18 0530  WBC 6.3  --   4.7  NEUTROABS 4.2  --  2.4  HGB 13.2 12.2 10.9*  HCT 37.4 36.0 30.6*  MCV 90.8  --  91.9  PLT 269  --  198   Cardiac Enzymes: Recent Labs  Lab 08/10/18 1848 08/11/18 0152 08/11/18 0530 08/11/18 1227  TROPONINI 0.04* 0.03* 0.03* <0.03   BNP (last 3 results) No results for input(s): PROBNP in the last 8760 hours. CBG: Recent Labs  Lab 08/11/18 0812 08/12/18 0812  GLUCAP 114* 173*   D-Dimer: No results for input(s): DDIMER in the last 72 hours. Hgb A1c: No results for input(s): HGBA1C in the last 72 hours. Lipid Profile: No results for input(s): CHOL, HDL, LDLCALC, TRIG, CHOLHDL, LDLDIRECT in the last 72 hours. Thyroid function studies: Recent Labs    08/11/18 0152  TSH 0.812   Anemia work up: No results for input(s):  VITAMINB12, FOLATE, FERRITIN, TIBC, IRON, RETICCTPCT in the last 72 hours. Sepsis Labs: Recent Labs  Lab 08/10/18 1848 08/11/18 0530  WBC 6.3 4.7  LATICACIDVEN 1.2  --    Microbiology Recent Results (from the past 240 hour(s))  Gastrointestinal Panel by PCR , Stool     Status: None   Collection Time: 08/11/18  9:04 AM  Result Value Ref Range Status   Campylobacter species NOT DETECTED NOT DETECTED Final   Plesimonas shigelloides NOT DETECTED NOT DETECTED Final   Salmonella species NOT DETECTED NOT DETECTED Final   Yersinia enterocolitica NOT DETECTED NOT DETECTED Final   Vibrio species NOT DETECTED NOT DETECTED Final   Vibrio cholerae NOT DETECTED NOT DETECTED Final   Enteroaggregative E coli (EAEC) NOT DETECTED NOT DETECTED Final   Enteropathogenic E coli (EPEC) NOT DETECTED NOT DETECTED Final   Enterotoxigenic E coli (ETEC) NOT DETECTED NOT DETECTED Final   Shiga like toxin producing E coli (STEC) NOT DETECTED NOT DETECTED Final   Shigella/Enteroinvasive E coli (EIEC) NOT DETECTED NOT DETECTED Final   Cryptosporidium NOT DETECTED NOT DETECTED Final   Cyclospora cayetanensis NOT DETECTED NOT DETECTED Final   Entamoeba histolytica NOT  DETECTED NOT DETECTED Final   Giardia lamblia NOT DETECTED NOT DETECTED Final   Adenovirus F40/41 NOT DETECTED NOT DETECTED Final   Astrovirus NOT DETECTED NOT DETECTED Final   Norovirus GI/GII NOT DETECTED NOT DETECTED Final   Rotavirus A NOT DETECTED NOT DETECTED Final   Sapovirus (I, II, IV, and V) NOT DETECTED NOT DETECTED Final    Comment: Performed at [facility], [address] [place], [place] [address]  C difficile quick scan w PCR reflex     Status: None   Collection Time: 08/11/18  3:28 PM  Result Value Ref Range Status   C Diff antigen NEGATIVE NEGATIVE Final   C Diff toxin NEGATIVE NEGATIVE Final   C Diff interpretation No C. difficile detected.  Final    Comment: Performed at [facility], [place] [place], [place] [address]     Medications:   . atorvastatin  10 mg Oral q1800  . calcium carbonate  1 tablet Oral TID WC  . citalopram  20 mg Oral Daily  . famotidine  10 mg Oral Daily  . heparin  5,000 Units Subcutaneous Q8H  . HYDROcodone-acetaminophen  1 tablet Oral 5 X Daily  . levothyroxine  75 mcg Oral Q0600  . pantoprazole  40 mg Oral Daily  . potassium chloride  40 mEq Oral BID  . sodium chloride flush  3 mL Intravenous Q12H   Continuous Infusions: . sodium chloride 100 mL/hr at 08/12/18 0500      LOS: 2 days   Charlynne Cousins  [facility]  08/12/2018, 10:31 AM

## 2018-08-12 NOTE — Consult Note (Signed)
[facility] Primary Care Navigator  08/12/2018  Teresa [name] 11/19/1940 [id number]   Met withpatientat the bedsideto identify possible discharge needs.  Patient reports being directed from her primary care provider's office to the ER due to low blood pressure related to several episodes of watery diarrhea. (acute renal failure, metabolic acidosis, severe hypokalemia, hyponatremia, hypotension, hypocalcemia, paroxysmal atrial fibrillation)  Teresa [name] with [facility] Care at [facility] asherprimary care provider.   [place] [facility] to obtain medications withoutany problem.  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  Patient states thatshehasbeendriving up to few weeks prior to admission but her son, daughter Teresa [name]) or friend Teresa [name]) are able to provide transportation toherdoctors' appointmentsas needed.  Patientlives alone and independent with care but her son is there all the time to assist her. Patient states that her daughter and her friend can also provide assistance with her care needs at home.  Anticipated plan for discharge is homeper patient.  Patientvoicedunderstanding to call primary care provider's office after dischargefor a post discharge follow-up appointment within 1- 2 weeksor sooner if needs arise. Patient letter (with PCP's contact number) was provided asareminder.  Explained to [place] services available for health managementand resourcesat homebutsheindicatedhaving no pressing needs for services or any concernsat this time. She states being capable andknowledgeable ofmanaging herhealthissues and conditionsat home ("took care of son with HF who has been followed at the [facility]"). Patient plans to obtain a working weighing scale after discharge to  monitor and record weight regularly.  However, patient had verbally agreedand optedforEMMI calls tofollow-up withherrecovery at home.  Referralwasmade forEMMIGeneral calls after discharge.  Patientverbalizedunderstanding to seek referral to [facility] care managementfrom primary care provider ifdeemed necessary and appropriate forfurtherservices in the near future.   [facility] care management information provided for future needs thatshe may have.    For additional questions please contact:  Edwena Felty A. Yvette Loveless, BSN, RN-BC [facility] PRIMARY CARE Navigator Cell: [phone]

## 2018-08-12 NOTE — Progress Notes (Signed)
VAST RN consulted to place new PIV. Upon arriving at pt's bedside, spoke with pt's nurse, Marya Amsler. He stated that the physician's note stated he wanted IV hydration with NS to continue, but that the NS had been discontinued.  [facility] RN contacted Dr. Olevia Bowens via secure chat to clarify. [name] current PIV leaking at site.

## 2018-08-12 NOTE — Plan of Care (Signed)
  Problem: Clinical Measurements: Goal: Ability to maintain clinical measurements within normal limits will improve Outcome: Progressing   

## 2018-08-13 ENCOUNTER — Inpatient Hospital Stay (HOSPITAL_COMMUNITY): Payer: Medicare Other

## 2018-08-13 DIAGNOSIS — I4891 Unspecified atrial fibrillation: Secondary | ICD-10-CM

## 2018-08-13 LAB — BASIC METABOLIC PANEL
ANION GAP: 10 (ref 5–15)
BUN: 48 mg/dL — ABNORMAL HIGH (ref 8–23)
CHLORIDE: 111 mmol/L (ref 98–111)
CO2: 17 mmol/L — ABNORMAL LOW (ref 22–32)
Calcium: 8.2 mg/dL — ABNORMAL LOW (ref 8.9–10.3)
Creatinine, Ser: 1.38 mg/dL — ABNORMAL HIGH (ref 0.44–1.00)
GFR calc Af Amer: 43 mL/min — ABNORMAL LOW (ref >60)
GFR calc non Af Amer: 37 mL/min — ABNORMAL LOW (ref >60)
Glucose, Bld: 107 mg/dL — ABNORMAL HIGH (ref 70–99)
Potassium: 4.2 mmol/L (ref 3.5–5.1)
Sodium: 138 mmol/L (ref 135–145)

## 2018-08-13 LAB — MAGNESIUM: Magnesium: 2.1 mg/dL (ref 1.7–2.4)

## 2018-08-13 LAB — ECHOCARDIOGRAM COMPLETE
HEIGHTINCHES: 63 in
Weight: 3418.01 oz

## 2018-08-13 LAB — GLUCOSE, CAPILLARY: Glucose-Capillary: 110 mg/dL — ABNORMAL HIGH (ref 70–99)

## 2018-08-13 MED ORDER — APIXABAN 5 MG PO TABS
5.0000 mg | ORAL_TABLET | Freq: Two times a day (BID) | ORAL | Status: DC
  Administered 2018-08-13 – 2018-08-17 (×9): 5 mg via ORAL
  Filled 2018-08-13 (×9): qty 1

## 2018-08-13 MED ORDER — CARVEDILOL 12.5 MG PO TABS: 12.5000 mg | ORAL_TABLET | Freq: Two times a day (BID) | ORAL | Status: DC

## 2018-08-13 MED ORDER — SODIUM CHLORIDE 0.9 % IV SOLN
INTRAVENOUS | Status: AC
  Administered 2018-08-13 (×2): via INTRAVENOUS

## 2018-08-13 MED ORDER — METOPROLOL TARTRATE 5 MG/5ML IV SOLN
5.0000 mg | Freq: Four times a day (QID) | INTRAVENOUS | Status: DC | PRN
  Administered 2018-08-13 – 2018-08-15 (×4): 5 mg via INTRAVENOUS
  Filled 2018-08-13 (×4): qty 5

## 2018-08-13 MED ORDER — SODIUM CHLORIDE 0.9 % IV BOLUS
500.0000 mL | Freq: Once | INTRAVENOUS | Status: AC
  Administered 2018-08-13: 500 mL via INTRAVENOUS

## 2018-08-13 NOTE — Care Management (Signed)
9470 08-13-18 Benefits Check Completed for Eliquis. Patient is aware of co pay and 30 day free card provided. Patient uses [employer]. No further needs identified at this time. Bethena Roys , RN,BSN Case Manager [phone]

## 2018-08-13 NOTE — Progress Notes (Addendum)
HR has been sustaining >100 but below 150. Seems to be averaging 115-130's. Lopressor IV is ordered for pt's HR, but holding due to soft BP. [place] paged about pt's HR. NaCl 500 cc bolus given. [name] BP came up to 111/66, so RN gave PRN lopressor. HR remained greater than 110's after lopressor, and pt remains in a-fib.

## 2018-08-13 NOTE — Discharge Instructions (Signed)
Information on my medicine - ELIQUIS® (apixaban) ° °This medication education was reviewed with me or my healthcare representative as part of my discharge preparation.  The pharmacist that spoke with me during my hospital stay was:  Aquanetta Schwarz Rhea, RPH ° °Why was Eliquis® prescribed for you? °Eliquis® was prescribed for you to reduce the risk of a blood clot forming that can cause a stroke if you have a medical condition called atrial fibrillation (a type of irregular heartbeat). ° °What do You need to know about Eliquis® ? °Take your Eliquis® TWICE DAILY - one tablet in the morning and one tablet in the evening with or without food. If you have difficulty swallowing the tablet whole please discuss with your pharmacist how to take the medication safely. ° °Take Eliquis® exactly as prescribed by your doctor and DO NOT stop taking Eliquis® without talking to the doctor who prescribed the medication.  Stopping may increase your risk of developing a stroke.  Refill your prescription before you run out. ° °After discharge, you should have regular check-up appointments with your healthcare provider that is prescribing your Eliquis®.  In the future your dose may need to be changed if your kidney function or weight changes by a significant amount or as you get older. ° °What do you do if you miss a dose? °If you miss a dose, take it as soon as you remember on the same day and resume taking twice daily.  Do not take more than one dose of ELIQUIS at the same time to make up a missed dose. ° °Important Safety Information °A possible side effect of Eliquis® is bleeding. You should call your healthcare provider right away if you experience any of the following: °  Bleeding from an injury or your nose that does not stop. °  Unusual colored urine (red or dark brown) or unusual colored stools (red or black). °  Unusual bruising for unknown reasons. °  A serious fall or if you hit your head (even if there is no  bleeding). ° °Some medicines may interact with Eliquis® and might increase your risk of bleeding or clotting while on Eliquis®. To help avoid this, consult your healthcare provider or pharmacist prior to using any new prescription or non-prescription medications, including herbals, vitamins, non-steroidal anti-inflammatory drugs (NSAIDs) and supplements. ° °This website has more information on Eliquis® (apixaban): http://www.eliquis.com/eliquis/home ° °

## 2018-08-13 NOTE — Care Management (Signed)
#    6.  Teresa [name]  [facility] @  [facility]  RX # [id number]   APIXABAN : NON-FORMULARY  1. ELIQUIS   5 MG BID COVER- YES CO-PAY- $ 47.00 TIER- 3 DRUG PRIOR APPROVAL- NO  2. ELIQUIS 2.5 MG BID COVER- YES CO-PAY- $ 47.00 TIER- 3 DRUG PRIOR APPROVAL- NO  PREFERRED PHARMACY : YES [facility]

## 2018-08-13 NOTE — Progress Notes (Signed)
[name]    Progress Note  Teresa [name]  RAQ:[id number] DOB: 12-07-1940 DOA: 08/10/2018 PCP: [name], MD     Brief Narrative:   Teresa [name] is an 78 y.o. female past medical history significant for hypothyroidism depression, anxiety presents to the emergency room for evaluation of generalized weakness, comes into the hospital post several episodes of watery diarrhea no fever chills abdominal pain or vomiting.  Assessment/Plan:   Acute renal failure (ARF) (HCC) metabolic acidosis with an increased anion gap: Likely prerenal azotemia in the setting of ARB and diuretic use and diarrhea. With a baseline creatinine of less than 1. Continue aggressive normal saline, monitor strict I's and O's. Her creatinine continues to improve today is 1.3, will decrease IV fluids, continue for an additional 24 hours.  Watery  diarrhea: C. difficile negative, GI panel negative. Continues to have copious amount of diarrhea. Continue IV fluids strict I's and O's. Diarrhea now resolved. Discontinue Imodium.  Severe hypokalemia: Due to diarrhea resolved with oral supplementation. Check a magnesium level.  Hypotension: Resolved with IV fluids. Continue to hold diuretics and ARB.  Hypovolemic hyponatremia: Resolved. Likely due to diarrhea.  Hypocalcemia: Replete calcium orally recheck in the morning.  Elevated troponins: She denies any chest pain EKG showed no signs of ischemia. Cardiac biomarkers have remained flat.  Hypothyroidism: Continue Synthroid.  Depression/anxiety: Resume BuSpar and Remeron. Continue Celexa.  Prolonged QTc: Now resolved.  Paroxysmal atrial fibrillation with RVR: CHADS-VASc is at least 4, will start Eliquis. Her Coreg was resumed, she has had episodes of RVR overnight telemetry. Check a 12-lead EKG, 2D echo. Increase Coreg.  Acute confusional state: Likely due to Benadryl. Avoid narcotics Ativan or Benadryl.  Use  Seroquel at bedtime.     DVT prophylaxis: heparin Family Communication:none Disposition Plan/Barrier to D/C: home in 2  days Code Status:     Code Status Orders  (From admission, onward)         Start     Ordered   08/10/18 2043  Full code  Continuous     08/10/18 2050        Code Status History    Date Active Date Inactive Code Status Order ID Comments User Context   02/04/2013 2124 02/07/2013 1631 Full Code [id number]  Excell Seltzer, MD Inpatient   09/13/2012 1520 09/14/2012 1647 Full Code [id number]  Rolm Bookbinder, MD Inpatient   04/20/2011 0006 05/02/2011 1552 Full Code [id number]  Long, Gilles Chiquito, RN Inpatient        IV Access:    Peripheral IV   Procedures and diagnostic studies:   No results found.   Medical Consultants:    None.  Anti-Infectives:   None  Subjective:    [name] watery diarrhea has resolved she has had no further bowel movements in 18 hours.   Objective:    Vitals:   08/12/18 2321 08/13/18 0131 08/13/18 0435 08/13/18 0500  BP: 96/73 96/67 111/66   Pulse:   (!) 108   Resp: 14 14 20    Temp:   97.6 F (36.4 C)   TempSrc:   Oral   SpO2:   97%   Weight:    96.9 kg  Height:        Intake/Output Summary (Last 24 hours) at 08/13/2018 0908 Last data filed at 08/13/2018 0300 Gross per 24 hour  Intake 629 ml  Output 700 ml  Net -71 ml   Filed Weights   08/10/18 1808 08/11/18 1437  08/13/18 0500  Weight: 99.8 kg 99.8 kg 96.9 kg    Exam: General exam: In no acute distress. Respiratory system: Good air movement and clear to auscultation. Cardiovascular system: S1 & S2 heard, RRR.  Negative JVD. Gastrointestinal system: Abdomen is nondistended, soft and nontender.  Central nervous system: Alert and oriented. No focal neurological deficits. Extremities: No pedal edema. Skin: No rashes, lesions or ulcers Psychiatry: Judgement and insight appear normal. Mood & affect appropriate.    Data Reviewed:    Labs: Basic  Metabolic Panel: Recent Labs  Lab 08/10/18 1848  08/11/18 0153 08/11/18 1308 08/11/18 1814 08/12/18 0928 08/13/18 0657  NA 124*   < > 126* 132* 133* 137 138  K 2.3*   < > 3.0* 2.5* 2.9* 3.1* 4.2  CL 85*  --  93* 98 100 106 111  CO2 11*  --  12* 19* 21* 22 17*  GLUCOSE 93  --  104* 96 130* 133* 107*  BUN 104*  --  90* 74* 70* 58* 48*  CREATININE 6.19*  --  4.22* 2.60* 2.41* 1.75* 1.38*  CALCIUM 6.4*  --  6.9* 6.9* 6.9* 7.4* 8.2*  MG 2.5*  --   --   --   --   --   --    < > = values in this interval not displayed.   GFR Estimated Creatinine Clearance: 37.8 mL/min (A) (by C-G formula based on SCr of 1.38 mg/dL (H)). Liver Function Tests: Recent Labs  Lab 08/10/18 1848  AST 12*  ALT 11  ALKPHOS 70  BILITOT 1.4*  PROT 6.8  ALBUMIN 3.7   No results for input(s): LIPASE, AMYLASE in the last 168 hours. No results for input(s): AMMONIA in the last 168 hours. Coagulation profile No results for input(s): INR, PROTIME in the last 168 hours.  CBC: Recent Labs  Lab 08/10/18 1848 08/10/18 1859 08/11/18 0530  WBC 6.3  --  4.7  NEUTROABS 4.2  --  2.4  HGB 13.2 12.2 10.9*  HCT 37.4 36.0 30.6*  MCV 90.8  --  91.9  PLT 269  --  198   Cardiac Enzymes: Recent Labs  Lab 08/10/18 1848 08/11/18 0152 08/11/18 0530 08/11/18 1227  TROPONINI 0.04* 0.03* 0.03* <0.03   BNP (last 3 results) No results for input(s): PROBNP in the last 8760 hours. CBG: Recent Labs  Lab 08/11/18 0812 08/12/18 0812 08/13/18 0729  GLUCAP 114* 173* 110*   D-Dimer: No results for input(s): DDIMER in the last 72 hours. Hgb A1c: No results for input(s): HGBA1C in the last 72 hours. Lipid Profile: No results for input(s): CHOL, HDL, LDLCALC, TRIG, CHOLHDL, LDLDIRECT in the last 72 hours. Thyroid function studies: Recent Labs    08/11/18 0152  TSH 0.812   Anemia work up: No results for input(s): VITAMINB12, FOLATE, FERRITIN, TIBC, IRON, RETICCTPCT in the last 72 hours. Sepsis Labs: Recent  Labs  Lab 08/10/18 1848 08/11/18 0530  WBC 6.3 4.7  LATICACIDVEN 1.2  --    Microbiology Recent Results (from the past 240 hour(s))  Gastrointestinal Panel by PCR , Stool     Status: None   Collection Time: 08/11/18  9:04 AM  Result Value Ref Range Status   Campylobacter species NOT DETECTED NOT DETECTED Final   Plesimonas shigelloides NOT DETECTED NOT DETECTED Final   Salmonella species NOT DETECTED NOT DETECTED Final   Yersinia enterocolitica NOT DETECTED NOT DETECTED Final   Vibrio species NOT DETECTED NOT DETECTED Final   Vibrio cholerae NOT DETECTED  NOT DETECTED Final   Enteroaggregative E coli (EAEC) NOT DETECTED NOT DETECTED Final   Enteropathogenic E coli (EPEC) NOT DETECTED NOT DETECTED Final   Enterotoxigenic E coli (ETEC) NOT DETECTED NOT DETECTED Final   Shiga like toxin producing E coli (STEC) NOT DETECTED NOT DETECTED Final   Shigella/Enteroinvasive E coli (EIEC) NOT DETECTED NOT DETECTED Final   Cryptosporidium NOT DETECTED NOT DETECTED Final   Cyclospora cayetanensis NOT DETECTED NOT DETECTED Final   Entamoeba histolytica NOT DETECTED NOT DETECTED Final   Giardia lamblia NOT DETECTED NOT DETECTED Final   Adenovirus F40/41 NOT DETECTED NOT DETECTED Final   Astrovirus NOT DETECTED NOT DETECTED Final   Norovirus GI/GII NOT DETECTED NOT DETECTED Final   Rotavirus A NOT DETECTED NOT DETECTED Final   Sapovirus (I, II, IV, and V) NOT DETECTED NOT DETECTED Final    Comment: Performed at [facility], [address] [place], [place] [address]  C difficile quick scan w PCR reflex     Status: None   Collection Time: 08/11/18  3:28 PM  Result Value Ref Range Status   C Diff antigen NEGATIVE NEGATIVE Final   C Diff toxin NEGATIVE NEGATIVE Final   C Diff interpretation No C. difficile detected.  Final    Comment: Performed at [facility], [place] [address] [place], [place] [address]     Medications:   . acidophilus   Oral Daily  . atorvastatin  10 mg  Oral q1800  . busPIRone  5 mg Oral BID  . calcium carbonate  1 tablet Oral TID WC  . carvedilol  12.5 mg Oral BID WC  . citalopram  20 mg Oral Daily  . famotidine  10 mg Oral Daily  . heparin  5,000 Units Subcutaneous Q8H  . levothyroxine  75 mcg Oral Q0600  . mirtazapine  7.5 mg Oral QHS  . pantoprazole  40 mg Oral Daily  . potassium chloride  40 mEq Oral BID  . psyllium  1 packet Oral Daily  . sodium chloride flush  3 mL Intravenous Q12H   Continuous Infusions:     LOS: 3 days   Charlynne Cousins  [facility]  08/13/2018, 9:08 AM

## 2018-08-13 NOTE — Progress Notes (Signed)
ANTICOAGULATION CONSULT NOTE - Initial Consult  Pharmacy Consult for apixaban Indication: atrial fibrillation  Allergies  Allergen Reactions  . Aspirin Other (See Comments)    HX Bleeding Ulcer   . Nsaids Other (See Comments)    Hx of bleeding ulcers  . Tolmetin Rash    Hx of bleeding ulcers    Patient Measurements: Height: 5\' 3"  (160 cm) Weight: 213 lb 10 oz (96.9 kg) IBW/kg (Calculated) : 52.4  Vital Signs: Temp: 97.6 F (36.4 C) (02/28 0435) Temp Source: Oral (02/28 0435) BP: 111/66 (02/28 0435) Pulse Rate: 108 (02/28 0435)  Labs: Recent Labs    08/10/18 1848 08/10/18 1859 08/11/18 0152  08/11/18 0530 08/11/18 1227  08/11/18 1814 08/12/18 0928 08/13/18 0657  HGB 13.2 12.2  --   --  10.9*  --   --   --   --   --   HCT 37.4 36.0  --   --  30.6*  --   --   --   --   --   PLT 269  --   --   --  198  --   --   --   --   --   APTT 32  --   --   --   --   --   --   --   --   --   CREATININE 6.19*  --   --    < >  --   --    < > 2.41* 1.75* 1.38*  TROPONINI 0.04*  --  0.03*  --  0.03* <0.03  --   --   --   --    < > = values in this interval not displayed.    Estimated Creatinine Clearance: 37.8 mL/min (A) (by C-G formula based on SCr of 1.38 mg/dL (H)).   Medical History: Past Medical History:  Diagnosis Date  . Abdominal pain   . Anxiety    maintained on Xanax tid for years.  . Arthritis    FEET, KNEES, HANDS  . Chronic systolic CHF (congestive heart failure), NYHA class 2 ([place])    EF 25% 2011 MV,  50-55% echo 6/13  . Diabetes mellitus type 2, diet-controlled ([place])   . GERD (gastroesophageal reflux disease)   . Glaucoma    s/p surgery B in 30s.  [place].  . H/O hiatal hernia   . History of glaucoma   . History of small bowel obstruction 2009 AND NOV 2012  . HTN (hypertension)   . Hypothyroidism   . Left ovarian cyst   . Moderate mitral regurgitation   . Moderate tricuspid regurgitation   . Non-healing surgical wound    ABDOMINAL S/P EXPLORATOY  LAPAROTOMY 04-18-2011  . Nonischemic cardiomyopathy ([place]) DR [facility]    EF is 25% per echo February 2012, non ischemic myoview 12/2009.  EF 50% echo 7/12 and plans for ICD cancelled.   . Peptic ulcer disease   . PVC (premature ventricular contraction)   . Shortness of breath   . Spinal stenosis   . Urge urinary incontinence   . Valvular heart disease    Moderate to severe MR, moderate TR, LAE per echo 7/11  . Wears dentures    upper denture-lower partial   Assessment: 78 year old female presents with weakness. Patient with new PAF with RVR overnight. New orders to start apixaban this morning.   No cbc since 2/26 (hgb 10.9), has been on sq heparin. Age 78, Wt>80kg, scr elevated  at 4, will continue with full dose apixaban for now.   Goal of Therapy:  Monitor platelets by anticoagulation protocol: Yes   Plan:  Apixaban 5mg  bid  Erin Hearing PharmD., BCPS Clinical Pharmacist 08/13/2018 10:44 AM

## 2018-08-13 NOTE — Care Management Important Message (Signed)
Important Message  Patient Details  Name: Teresa [name] MRN: [id number] Date of Birth: December 19, 1940   Medicare Important Message Given:  Yes    [name] 08/13/2018, 2:20 PM

## 2018-08-14 DIAGNOSIS — I5020 Unspecified systolic (congestive) heart failure: Secondary | ICD-10-CM

## 2018-08-14 DIAGNOSIS — I4891 Unspecified atrial fibrillation: Secondary | ICD-10-CM

## 2018-08-14 LAB — BASIC METABOLIC PANEL
Anion gap: 7 (ref 5–15)
BUN: 33 mg/dL — AB (ref 8–23)
CO2: 18 mmol/L — AB (ref 22–32)
Calcium: 8.1 mg/dL — ABNORMAL LOW (ref 8.9–10.3)
Chloride: 115 mmol/L — ABNORMAL HIGH (ref 98–111)
Creatinine, Ser: 1.2 mg/dL — ABNORMAL HIGH (ref 0.44–1.00)
GFR calc Af Amer: 50 mL/min — ABNORMAL LOW (ref >60)
GFR calc non Af Amer: 44 mL/min — ABNORMAL LOW (ref >60)
GLUCOSE: 102 mg/dL — AB (ref 70–99)
Potassium: 4.1 mmol/L (ref 3.5–5.1)
Sodium: 140 mmol/L (ref 135–145)

## 2018-08-14 LAB — GLUCOSE, CAPILLARY: Glucose-Capillary: 100 mg/dL — ABNORMAL HIGH (ref 70–99)

## 2018-08-14 MED ORDER — METOPROLOL SUCCINATE ER 50 MG PO TB24
50.0000 mg | ORAL_TABLET | Freq: Two times a day (BID) | ORAL | Status: DC
  Administered 2018-08-15 – 2018-08-16 (×4): 50 mg via ORAL
  Filled 2018-08-14 (×5): qty 1

## 2018-08-14 MED ORDER — FUROSEMIDE 20 MG PO TABS: 20.0000 mg | ORAL_TABLET | Freq: Every day | ORAL | Status: DC | PRN

## 2018-08-14 NOTE — Progress Notes (Signed)
[name]    Progress Note  [name]  CBJ:[id number] DOB: Feb 01, 1941 DOA: 08/10/2018 PCP: [name], MD     Brief Narrative:   Teresa [name] is an 78 y.o. female past medical history significant for hypothyroidism depression, anxiety presents to the emergency room for evaluation of generalized weakness, comes into the hospital post several episodes of watery diarrhea no fever chills abdominal pain or vomiting.  Assessment/Plan:   Acute renal failure (ARF) (HCC) metabolic acidosis with an increased anion gap: Likely prerenal azotemia in the setting of ARB and diuretic use and diarrhea. With a baseline creatinine of less than 1. KVO IV fluids her creatinine has returned to baseline. Resume diuretic therapy.  Watery  diarrhea: Resolved.  Severe hypokalemia: Due to diarrhea resolved with oral supplementation. Check a magnesium level.  Hypotension: Resolved with IV fluids. Continue to hold diuretics and ARB.  Hypovolemic hyponatremia: Resolved. Likely due to diarrhea.  Hypocalcemia: Replete calcium orally recheck in the morning.  Elevated troponins: She denies any chest pain EKG showed no signs of ischemia. Cardiac biomarkers have remained flat.  Hypothyroidism: Continue Synthroid.  Depression/anxiety: Resume BuSpar and Remeron. Continue Celexa.  Prolonged QTc: Now resolved.  New onset atrial fibrillation with RVR: CHADS-VASc is at least 4, continue Eliquis. Coreg is at max dose. Twelve-lead EKG showed atrial fibrillation. 2D echo showed new decrease in EF now 35%. Likely nonischemic in the setting of A. fib with RVR, consult cardiology.  Acute confusional state: Likely due to Benadryl. Avoid narcotics Ativan or Benadryl.  Use Seroquel at bedtime.     DVT prophylaxis: heparin Family Communication:none Disposition Plan/Barrier to D/C: home in 2  days Code Status:     Code Status Orders  (From admission, onward)          Start     Ordered   08/10/18 2043  Full code  Continuous     08/10/18 2050        Code Status History    Date Active Date Inactive Code Status Order ID Comments User Context   02/04/2013 2124 02/07/2013 1631 Full Code [id number]  Excell Seltzer, MD Inpatient   09/13/2012 1520 09/14/2012 1647 Full Code [id number]  Rolm Bookbinder, MD Inpatient   04/20/2011 0006 05/02/2011 1552 Full Code [id number]  Long, Gilles Chiquito, RN Inpatient        IV Access:    Peripheral IV   Procedures and diagnostic studies:   No results found.   Medical Consultants:    None.  Anti-Infectives:   None  Subjective:    Teresa [name] her diarrhea has resolved she denies any shortness of breath or chest pain.   Objective:    Vitals:   08/13/18 0500 08/13/18 1352 08/13/18 2059 08/14/18 0418  BP:  108/77 (!) 85/66 117/66  Pulse:  (!) 106 91 (!) 131  Resp:  20    Temp:  97.8 F (36.6 C) 97.7 F (36.5 C) 97.6 F (36.4 C)  TempSrc:  Oral  Oral  SpO2:  99% 98% 99%  Weight: 96.9 kg   96.6 kg  Height:        Intake/Output Summary (Last 24 hours) at 08/14/2018 0849 Last data filed at 08/14/2018 0600 Gross per 24 hour  Intake 1470.06 ml  Output -  Net 1470.06 ml   Filed Weights   08/11/18 1437 08/13/18 0500 08/14/18 0418  Weight: 99.8 kg 96.9 kg 96.6 kg    Exam: General exam: In no acute distress. Respiratory  system: Good air movement and clear to auscultation. Cardiovascular system: S1 & S2 heard, RRR.  Negative JVD. Gastrointestinal system: Abdomen is nondistended, soft and nontender.  Central nervous system: Alert and oriented. No focal neurological deficits. Extremities: No pedal edema. Skin: No rashes, lesions or ulcers Psychiatry: Judgement and insight appear normal. Mood & affect appropriate.    Data Reviewed:    Labs: Basic Metabolic Panel: Recent Labs  Lab 08/10/18 1848  08/11/18 1308 08/11/18 1814 08/12/18 0928 08/13/18 0657 08/14/18 0709  NA 124*   <  > 132* 133* 137 138 140  K 2.3*   < > 2.5* 2.9* 3.1* 4.2 4.1  CL 85*   < > 98 100 106 111 115*  CO2 11*   < > 19* 21* 22 17* 18*  GLUCOSE 93   < > 96 130* 133* 107* 102*  BUN 104*   < > 74* 70* 58* 48* 33*  CREATININE 6.19*   < > 2.60* 2.41* 1.75* 1.38* 1.20*  CALCIUM 6.4*   < > 6.9* 6.9* 7.4* 8.2* 8.1*  MG 2.5*  --   --   --   --  2.1  --    < > = values in this interval not displayed.   GFR Estimated Creatinine Clearance: 43.4 mL/min (A) (by C-G formula based on SCr of 1.2 mg/dL (H)). Liver Function Tests: Recent Labs  Lab 08/10/18 1848  AST 12*  ALT 11  ALKPHOS 70  BILITOT 1.4*  PROT 6.8  ALBUMIN 3.7   No results for input(s): LIPASE, AMYLASE in the last 168 hours. No results for input(s): AMMONIA in the last 168 hours. Coagulation profile No results for input(s): INR, PROTIME in the last 168 hours.  CBC: Recent Labs  Lab 08/10/18 1848 08/10/18 1859 08/11/18 0530  WBC 6.3  --  4.7  NEUTROABS 4.2  --  2.4  HGB 13.2 12.2 10.9*  HCT 37.4 36.0 30.6*  MCV 90.8  --  91.9  PLT 269  --  198   Cardiac Enzymes: Recent Labs  Lab 08/10/18 1848 08/11/18 0152 08/11/18 0530 08/11/18 1227  TROPONINI 0.04* 0.03* 0.03* <0.03   BNP (last 3 results) No results for input(s): PROBNP in the last 8760 hours. CBG: Recent Labs  Lab 08/11/18 0812 08/12/18 0812 08/13/18 0729 08/14/18 0717  GLUCAP 114* 173* 110* 100*   D-Dimer: No results for input(s): DDIMER in the last 72 hours. Hgb A1c: No results for input(s): HGBA1C in the last 72 hours. Lipid Profile: No results for input(s): CHOL, HDL, LDLCALC, TRIG, CHOLHDL, LDLDIRECT in the last 72 hours. Thyroid function studies: No results for input(s): TSH, T4TOTAL, T3FREE, THYROIDAB in the last 72 hours.  Invalid input(s): FREET3 Anemia work up: No results for input(s): VITAMINB12, FOLATE, FERRITIN, TIBC, IRON, RETICCTPCT in the last 72 hours. Sepsis Labs: Recent Labs  Lab 08/10/18 1848 08/11/18 0530  WBC 6.3 4.7   LATICACIDVEN 1.2  --    Microbiology Recent Results (from the past 240 hour(s))  Gastrointestinal Panel by PCR , Stool     Status: None   Collection Time: 08/11/18  9:04 AM  Result Value Ref Range Status   Campylobacter species NOT DETECTED NOT DETECTED Final   Plesimonas shigelloides NOT DETECTED NOT DETECTED Final   Salmonella species NOT DETECTED NOT DETECTED Final   Yersinia enterocolitica NOT DETECTED NOT DETECTED Final   Vibrio species NOT DETECTED NOT DETECTED Final   Vibrio cholerae NOT DETECTED NOT DETECTED Final   Enteroaggregative E coli (EAEC)  NOT DETECTED NOT DETECTED Final   Enteropathogenic E coli (EPEC) NOT DETECTED NOT DETECTED Final   Enterotoxigenic E coli (ETEC) NOT DETECTED NOT DETECTED Final   Shiga like toxin producing E coli (STEC) NOT DETECTED NOT DETECTED Final   Shigella/Enteroinvasive E coli (EIEC) NOT DETECTED NOT DETECTED Final   Cryptosporidium NOT DETECTED NOT DETECTED Final   Cyclospora cayetanensis NOT DETECTED NOT DETECTED Final   Entamoeba histolytica NOT DETECTED NOT DETECTED Final   Giardia lamblia NOT DETECTED NOT DETECTED Final   Adenovirus F40/41 NOT DETECTED NOT DETECTED Final   Astrovirus NOT DETECTED NOT DETECTED Final   Norovirus GI/GII NOT DETECTED NOT DETECTED Final   Rotavirus A NOT DETECTED NOT DETECTED Final   Sapovirus (I, II, IV, and V) NOT DETECTED NOT DETECTED Final    Comment: Performed at [facility], [place], [address] [place], [place] [address]  C difficile quick scan w PCR reflex     Status: None   Collection Time: 08/11/18  3:28 PM  Result Value Ref Range Status   C Diff antigen NEGATIVE NEGATIVE Final   C Diff toxin NEGATIVE NEGATIVE Final   C Diff interpretation No C. difficile detected.  Final    Comment: Performed at [facility], [place] [address], [place], [place] [address]     Medications:   . acidophilus   Oral Daily  . apixaban  5 mg Oral BID  . atorvastatin  10 mg Oral q1800  . busPIRone  5  mg Oral BID  . carvedilol  12.5 mg Oral BID WC  . citalopram  20 mg Oral Daily  . famotidine  10 mg Oral Daily  . levothyroxine  75 mcg Oral Q0600  . mirtazapine  7.5 mg Oral QHS  . pantoprazole  40 mg Oral Daily  . psyllium  1 packet Oral Daily  . sodium chloride flush  3 mL Intravenous Q12H   Continuous Infusions: . sodium chloride 75 mL/hr at 08/13/18 2113      LOS: 4 days   Charlynne Cousins  [facility]  08/14/2018, 8:49 AM

## 2018-08-14 NOTE — Plan of Care (Signed)
  Problem: Clinical Measurements: Goal: Cardiovascular complication will be avoided Outcome: Progressing   

## 2018-08-14 NOTE — Consult Note (Signed)
Cardiology Consultation:   Patient ID: Teresa [name] MRN: [id number]; DOB: 07/06/1940  Admit date: 08/10/2018 Date of Consult: 08/14/2018  Primary Care Provider: [name], MD Primary Cardiologist: [place]/Typically sees Gerhardt Primary Electrophysiologist:  None    Patient Profile:   Teresa [name] is a 78 y.o. female with a hx of prior LV dysfunction that had previously normalized who is being seen today for the evaluation of afib with RVR and acute systolic HF at the request of Dr Olevia Bowens  History of Present Illness:   Ms. Teresa [name] 78 yo female history of NICM LVEF previously 25% but recovered (LVEF 2016 was 50-55%) , HTN, PVCs, IBS, DM, spinal stenosis admitted with genrealized weakness. Reported recent issues with diarrhea on admission, hypotensive, presented hyponatremic N 124, hypokalemic 2.3 with AKI Cr to 6.19   During this admission patient found to be in afib with RVR, and also found to have acute drop in LV systolic dysfunction, cardiology has been consulted  Today she denies any significant palpitations. Denies any SOB/DOE, no orthopnea or PND   K 4.1 Cr 1.2 BUN 33 Na 140 Mg 2.1 TSH 0.8 Trop 0.03-0.04 CXR no acute process  EKG afib, chronic ST/T changes Echo LVEF 35-40%, moderate LAE 2017 nuclear stress no ischemia      Past Medical History:  Diagnosis Date  . Abdominal pain   . Anxiety    maintained on Xanax tid for years.  . Arthritis    FEET, KNEES, HANDS  . Chronic systolic CHF (congestive heart failure), NYHA class 2 ([place])    EF 25% 2011 MV,  50-55% echo 6/13  . Diabetes mellitus type 2, diet-controlled ([place])   . GERD (gastroesophageal reflux disease)   . Glaucoma    s/p surgery B in 30s.  [place].  . H/O hiatal hernia   . History of glaucoma   . History of small bowel obstruction 2009 AND NOV 2012  . HTN (hypertension)   . Hypothyroidism   . Left ovarian cyst   . Moderate mitral regurgitation   . Moderate tricuspid regurgitation   .  Non-healing surgical wound    ABDOMINAL S/P EXPLORATOY LAPAROTOMY 04-18-2011  . Nonischemic cardiomyopathy ([place]) DR [facility]    EF is 25% per echo February 2012, non ischemic myoview 12/2009.  EF 50% echo 7/12 and plans for ICD cancelled.   . Peptic ulcer disease   . PVC (premature ventricular contraction)   . Shortness of breath   . Spinal stenosis   . Urge urinary incontinence   . Valvular heart disease    Moderate to severe MR, moderate TR, LAE per echo 7/11  . Wears dentures    upper denture-lower partial    Past Surgical History:  Procedure Laterality Date  . ABDOMINAL HERNIA REPAIR  32 YRS AGO   PERITINITIS  . APPENDECTOMY  1979   RUPTURED  . BENIGN RIGHT BREAST TUMOR REMOVED    . CARDIOVASCULAR STRESS TEST  JULY 2011-  DR HOCHREIN   NO ISCHEMIA  . EXPLORATOMY LAP. / EXTENSIVE LYSIS ADHESIONS/ SMALL BOWEL RESECTION X2 WITH PRIMARY ANASTOMOSIS X2  04-18-2011   SMALL BOWEL OBSTRUCTION  . GLAUCOMA SURGERY  1978  . HERNIA REPAIR    . INCISION AND DRAINAGE OF WOUND N/A 09/13/2012   Procedure: INCISION  AND DEBRIDEMENT WOUND abdominal wall ;  Surgeon: Rolm Bookbinder, MD;  Location: [place];  Service: General;  Laterality: N/A;  coordination with Dr Migdalia Dk   . KNEE ARTHROSCOPY W/ MENISCECTOMY  06-05-2004  .  THYROIDECTOMY  1978 GOITOR   AND CERVICAL COLD KNIFE CONE BX  . TONSILLECTOMY  AGE 32  . TRANSTHORACIC ECHOCARDIOGRAM  12-27-2010   EF 45-50%/ MODERATE MV REGURG. / LEFT ATRIUM MILDLY DILATED/ MILD TRICUSPID REGURG.  . TUBAL LIGATION    . VENTRAL HERNIA REPAIR  X4  LAST ONE 20 YRS AGO   ABDOMINAL --  EACH ONE IN DIFFERENT AREA  . VENTRAL HERNIA REPAIR  01/29/2012   Procedure: HERNIA REPAIR VENTRAL ADULT;  Surgeon: Theodoro Kos, DO;  Location: [place];  Service: Plastics;  Laterality: N/A;  . WOUND DEBRIDEMENT  09/25/2011   Procedure: DEBRIDEMENT CLOSURE/ABDOMINAL WOUND;  Surgeon: Theodoro Kos, DO;  Location: [place];  Service: Plastics;   Laterality: N/A;  excision of abdominal wound with primary closure     Inpatient Medications: Scheduled Meds: . acidophilus   Oral Daily  . apixaban  5 mg Oral BID  . atorvastatin  10 mg Oral q1800  . busPIRone  5 mg Oral BID  . carvedilol  12.5 mg Oral BID WC  . citalopram  20 mg Oral Daily  . famotidine  10 mg Oral Daily  . levothyroxine  75 mcg Oral Q0600  . mirtazapine  7.5 mg Oral QHS  . pantoprazole  40 mg Oral Daily  . psyllium  1 packet Oral Daily  . sodium chloride flush  3 mL Intravenous Q12H   Continuous Infusions:  PRN Meds: acetaminophen **OR** acetaminophen, albuterol, diphenhydrAMINE, furosemide, HYDROcodone-acetaminophen, metoprolol tartrate, QUEtiapine  Allergies:    Allergies  Allergen Reactions  . Aspirin Other (See Comments)    HX Bleeding Ulcer   . Nsaids Other (See Comments)    Hx of bleeding ulcers  . Tolmetin Rash    Hx of bleeding ulcers    Social History:   Social History   Socioeconomic History  . Marital status: Widowed    Spouse name: Not on file  . Number of children: 2  . Years of education: Not on file  . Highest education level: Not on file  Occupational History  . Occupation: RETIRED  Social Needs  . Financial resource strain: Not on file  . Food insecurity:    Worry: Not on file    Inability: Not on file  . Transportation needs:    Medical: Not on file    Non-medical: Not on file  Tobacco Use  . Smoking status: Former Smoker    Packs/day: 1.50    Years: 45.00    Pack years: 67.50    Types: Cigarettes    Start date: 11/14/1964    Last attempt to quit: 09/22/2010    Years since quitting: 7.8  . Smokeless tobacco: Never Used  . Tobacco comment: quit 2011 or 2012 - Didn't smoke during pregnancies  Substance and Sexual Activity  . Alcohol use: No  . Drug use: No  . Sexual activity: Not Currently  Lifestyle  . Physical activity:    Days per week: Not on file    Minutes per session: Not on file  . Stress: Not on file    Relationships  . Social connections:    Talks on phone: Not on file    Gets together: Not on file    Attends religious service: Not on file    Active member of club or organization: Not on file    Attends meetings of clubs or organizations: Not on file    Relationship status: Not on file  . Intimate partner violence:  Fear of current or ex partner: Not on file    Emotionally abused: Not on file    Physically abused: Not on file    Forced sexual activity: Not on file  Other Topics Concern  . Not on file  Social History Narrative   Marital status:  Widowed as of 09-27-10; not dating.        Children: four children (1 daughter in [place], 1 daughter in [place] estranged; 1 son in [place]; 1 son deceased in 27-Sep-2014); six grandchildren; 5 gg.      Lives: alone with a yorkie.  Son/Harry lives close.        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      Tobacco:  None; quit in Sep 27, 2010.        Alcohol: none      Drugs:none      Exercise:  No formal exercise program.  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  Cannot swim.        ADLs:  Driving; cleans own house; does grocery shopping; pays bills; does not use cane for ambulation.  Drives.        Advanced Directives: no living will; no HCPOA.  DNR/DNI.        [facility]:   Originally from [facility]. Has always lived in [place]. She moved to [place], [place]. 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 Has a dog currently. No bird or mold exposure.     Family History:    Family History  Problem Relation Age of Onset  . Stroke Brother   . Diabetes Brother   . Tuberculosis Father   . Stroke Mother   . Stroke Sister   . Heart disease Sister   . Emphysema Sister   . Diabetes Daughter      ROS:  Please see the history of present illness.   All other ROS reviewed and negative.     Physical Exam/Data:   Vitals:   08/13/18 1352 08/13/18 09-26-57 08/14/18 0418 08/14/18 0800  BP:  108/77 (!) 85/66 117/66   Pulse: (!) 106 91 (!) 131 81  Resp: 20     Temp: 97.8 F (36.6 C) 97.7 F (36.5 C) 97.6 F (36.4 C)   TempSrc: Oral  Oral   SpO2: 99% 98% 99% 99%  Weight:   96.6 kg   Height:        Intake/Output Summary (Last 24 hours) at 08/14/2018 1212 Last data filed at 08/14/2018 0853 Gross per 24 hour  Intake 1410.06 ml  Output 100 ml  Net 1310.06 ml   Last 3 Weights 08/14/2018 08/13/2018 08/11/2018  Weight (lbs) 213 lb 213 lb 10 oz 220 lb 0.3 oz  Weight (kg) 96.616 kg 96.9 kg 99.8 kg     Body mass index is 37.73 kg/m.  General:  Well nourished, well developed, in no acute distress HEENT: normal Lymph: no adenopathy Neck: no JVD Endocrine:  No thryomegaly Cardiac:  Irregular, tach Lungs:  clear to auscultation bilaterally, no wheezing, rhonchi or rales  Abd: soft, nontender, no hepatomegaly  Ext: no edema Musculoskeletal:  No deformities, BUE and BLE strength normal and equal Skin: warm and dry  Neuro:  CNs 2-12 intact, no focal abnormalities noted Psych:  Normal affect     Laboratory Data:  Chemistry Recent Labs  Lab 08/12/18 0928 08/13/18 0657 08/14/18 0709  NA 137 138 140  K 3.1* 4.2 4.1  CL 106 111 115*  CO2 22 17* 18*  GLUCOSE 133* 107* 102*  BUN 58* 48* 33*  CREATININE 1.75* 1.38* 1.20*  CALCIUM 7.4* 8.2* 8.1*  GFRNONAA 28* 37* 44*  GFRAA 32* 43* 50*  ANIONGAP 9 10 7     Recent Labs  Lab 08/10/18 1848  PROT 6.8  ALBUMIN 3.7  AST 12*  ALT 11  ALKPHOS 70  BILITOT 1.4*   Hematology Recent Labs  Lab 08/10/18 1848 08/10/18 1859 08/11/18 0530  WBC 6.3  --  4.7  RBC 4.12  --  3.33*  HGB 13.2 12.2 10.9*  HCT 37.4 36.0 30.6*  MCV 90.8  --  91.9  MCH 32.0  --  32.7  MCHC 35.3  --  35.6  RDW 12.1  --  12.3  PLT 269  --  198   Cardiac Enzymes Recent Labs  Lab 08/10/18 1848 08/11/18 0152 08/11/18 0530 08/11/18 1227  TROPONINI 0.04* 0.03* 0.03* <0.03   No results for input(s): TROPIPOC in the last 168 hours.  BNPNo  results for input(s): BNP, PROBNP in the last 168 hours.  DDimer No results for input(s): DDIMER in the last 168 hours.  Radiology/Studies:  Dg Chest 1 View  Result Date: 08/10/2018 CLINICAL DATA:  Weakness and dizziness. Nausea. EXAM: CHEST  1 VIEW COMPARISON:  08/30/2015 FINDINGS: Mild cardiac enlargement. No vascular congestion, edema, or consolidation. No blunting of costophrenic angles. No pneumothorax. Mediastinal contours appear intact. IMPRESSION: Mild cardiac enlargement. No evidence of active pulmonary disease. Electronically Signed   By: Lucienne Capers M.D.   On: 08/10/2018 19:06   US Renal  Result Date: 08/10/2018 CLINICAL DATA:  Acute renal failure EXAM: RENAL / URINARY TRACT ULTRASOUND COMPLETE COMPARISON:  MRI 09/30/2016 FINDINGS: Right Kidney: Renal measurements: 9.3 x 4.0 x 4.4 cm = volume: 87 mL . Echogenicity within normal limits. No mass or hydronephrosis visualized. Left Kidney: Renal measurements: 9.9 x 4.3 x 4.0 cm = volume: 91 mL. Echogenicity within normal limits. No mass or hydronephrosis visualized. Bladder: Appears normal for degree of bladder distention. IMPRESSION: Unremarkable renal ultrasound. Electronically Signed   By: Rolm Baptise M.D.   On: 08/10/2018 22:12    Assessment and Plan:   1. Recurrent systolic HF - LVEF previously decrased, later normalized by 2016 echo. 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 Recent stress 2017 without ischemia.  - recurrent dysfunction detected this admission, LVEF 35%  - medical therapy limited by soft bp's. Consider additional agents once rate controlled afib, beta blocker current priority - actually presented hypovolemic due to significant diarrhea  - would plan to optimize management of her afib, optimize medical therapy for CHF, and let her recover from her acute GI illness. I would not plan for inpatient ischemic testing, likely repeat echo in 3 months of medical therapy with  controlled afib and decide after that if ischemic evaluation is warranted.   2. Afib - new diagnosis this admission based on records, though family reports she may have had before - started on eliquis, she is on coreg 12.5mg  bid. Soft bp's at times - we will change to toprol for rate control with less bp effects starting tomorrow - would anticipate TEE/DCCV Monday in setting of afib with RVR, difficult to control rates due to low bp's, and new systolic HF.   3. Diarrhea/AKI/Hyponatremia - reason for admission, resolved  For questions or updates, please contact [facility] Please consult [email or web address] for contact info under  Merrily Pew, MD  08/14/2018 12:12 PM

## 2018-08-15 LAB — BASIC METABOLIC PANEL
Anion gap: 8 (ref 5–15)
BUN: 19 mg/dL (ref 8–23)
CO2: 18 mmol/L — ABNORMAL LOW (ref 22–32)
CREATININE: 1.13 mg/dL — AB (ref 0.44–1.00)
Calcium: 8.2 mg/dL — ABNORMAL LOW (ref 8.9–10.3)
Chloride: 114 mmol/L — ABNORMAL HIGH (ref 98–111)
GFR calc Af Amer: 54 mL/min — ABNORMAL LOW (ref >60)
GFR calc non Af Amer: 47 mL/min — ABNORMAL LOW (ref >60)
Glucose, Bld: 105 mg/dL — ABNORMAL HIGH (ref 70–99)
Potassium: 3.7 mmol/L (ref 3.5–5.1)
Sodium: 140 mmol/L (ref 135–145)

## 2018-08-15 LAB — GLUCOSE, CAPILLARY: Glucose-Capillary: 110 mg/dL — ABNORMAL HIGH (ref 70–99)

## 2018-08-15 MED ORDER — SODIUM CHLORIDE 0.9% FLUSH: 3.0000 mL | INTRAVENOUS | Status: DC | PRN

## 2018-08-15 MED ORDER — SODIUM CHLORIDE 0.9 % IV SOLN: 250.0000 mL | INTRAVENOUS | Status: DC

## 2018-08-15 MED ORDER — SODIUM CHLORIDE 0.9% FLUSH
3.0000 mL | Freq: Two times a day (BID) | INTRAVENOUS | Status: DC
  Administered 2018-08-15 – 2018-08-16 (×3): 3 mL via INTRAVENOUS

## 2018-08-15 MED ORDER — SODIUM CHLORIDE 0.9 % IV SOLN
INTRAVENOUS | Status: DC
  Administered 2018-08-16: 07:00:00 via INTRAVENOUS

## 2018-08-15 NOTE — Plan of Care (Signed)
  Problem: Clinical Measurements: Goal: Ability to maintain clinical measurements within normal limits will improve Outcome: Progressing Goal: Cardiovascular complication will be avoided Outcome: Progressing   

## 2018-08-15 NOTE — Progress Notes (Signed)
[name]    Progress Note  Teresa [name]  NKN:[id number] DOB: 12-15-1940 DOA: 08/10/2018 PCP: [name], MD     Brief Narrative:   Teresa [name] is an 78 y.o. female past medical history significant for hypothyroidism depression, anxiety presents to the emergency room for evaluation of generalized weakness, comes into the hospital post several episodes of watery diarrhea no fever chills abdominal pain or vomiting.  Assessment/Plan:   Acute renal failure (ARF) (HCC) metabolic acidosis with an increased anion gap: Likely prerenal azotemia in the setting of ARB and diuretic use and diarrhea. With a baseline creatinine of less than 1. KVO IV fluids her creatinine has returned to baseline. Continue diuretic therapy.  Watery  diarrhea: Resolved.  Severe hypokalemia: Due to diarrhea resolved with oral supplementation. Check a magnesium level.  Hypotension: Resolved with IV fluids. Continue to hold diuretics and ARB due to hypotension.  Hypovolemic hyponatremia: Resolved. Likely due to diarrhea.  Hypocalcemia: Replete calcium orally recheck in the morning.  Elevated troponins: She denies any chest pain EKG showed no signs of ischemia. Cardiac biomarkers have remained flat.  Hypothyroidism: Continue Synthroid.  Depression/anxiety: Resume BuSpar and Remeron. Continue Celexa.  Prolonged QTc: Now resolved.  New onset atrial fibrillation with RVR: CHADS-VASc is at least 4, continue Eliquis. On metoprolol Cardiology was consulted who agree with plan. Plan for cardioversion on 08/16/2018  New onset acute systolic heart failure nonischemic: Continue metoprolol and Lasix.  Acute confusional state: Likely due to Benadryl. Avoid narcotics Ativan or Benadryl.  Use Seroquel at bedtime.     DVT prophylaxis: heparin Family Communication:none Disposition Plan/Barrier to D/C: home in 2  days Code Status:     Code Status Orders  (From  admission, onward)         Start     Ordered   08/10/18 2043  Full code  Continuous     08/10/18 2050        Code Status History    Date Active Date Inactive Code Status Order ID Comments User Context   02/04/2013 2124 02/07/2013 1631 Full Code [id number]  Excell Seltzer, MD Inpatient   09/13/2012 1520 09/14/2012 1647 Full Code [id number]  Rolm Bookbinder, MD Inpatient   04/20/2011 0006 05/02/2011 1552 Full Code [id number]  Long, Gilles Chiquito, RN Inpatient        IV Access:    Peripheral IV   Procedures and diagnostic studies:   No results found.   Medical Consultants:    None.  Anti-Infectives:   None  Subjective:    Teresa [name] is any chest pain feels great.   Objective:    Vitals:   08/14/18 1513 08/14/18 2027 08/15/18 0429 08/15/18 0815  BP: 112/71 106/79 (!) 123/102   Pulse:  87 (!) 113 (!) 50  Resp:  15 19   Temp: 97.7 F (36.5 C) 99 F (37.2 C) (!) 97.3 F (36.3 C)   TempSrc: Oral  Oral   SpO2: 99%  100% 99%  Weight:   97 kg   Height:        Intake/Output Summary (Last 24 hours) at 08/15/2018 0908 Last data filed at 08/15/2018 0430 Gross per 24 hour  Intake 240 ml  Output 350 ml  Net -110 ml   Filed Weights   08/13/18 0500 08/14/18 0418 08/15/18 0429  Weight: 96.9 kg 96.6 kg 97 kg    Exam: General exam: In no acute distress. Respiratory system: Good air movement and clear to auscultation.  Cardiovascular system: S1 & S2 heard, RRR.  Negative JVD. Gastrointestinal system: Abdomen is nondistended, soft and nontender.  Central nervous system: Alert and oriented. No focal neurological deficits. Extremities: No pedal edema. Skin: No rashes, lesions or ulcers Psychiatry: Judgement and insight appear normal. Mood & affect appropriate.    Data Reviewed:    Labs: Basic Metabolic Panel: Recent Labs  Lab 08/10/18 1848  08/11/18 1814 08/12/18 6720 08/13/18 0657 08/14/18 0709 08/15/18 0336  NA 124*   < > 133* 137 138 140 140  K 2.3*    < > 2.9* 3.1* 4.2 4.1 3.7  CL 85*   < > 100 106 111 115* 114*  CO2 11*   < > 21* 22 17* 18* 18*  GLUCOSE 93   < > 130* 133* 107* 102* 105*  BUN 104*   < > 70* 58* 48* 33* 19  CREATININE 6.19*   < > 2.41* 1.75* 1.38* 1.20* 1.13*  CALCIUM 6.4*   < > 6.9* 7.4* 8.2* 8.1* 8.2*  MG 2.5*  --   --   --  2.1  --   --    < > = values in this interval not displayed.   GFR Estimated Creatinine Clearance: 46.2 mL/min (A) (by C-G formula based on SCr of 1.13 mg/dL (H)). Liver Function Tests: Recent Labs  Lab 08/10/18 1848  AST 12*  ALT 11  ALKPHOS 70  BILITOT 1.4*  PROT 6.8  ALBUMIN 3.7   No results for input(s): LIPASE, AMYLASE in the last 168 hours. No results for input(s): AMMONIA in the last 168 hours. Coagulation profile No results for input(s): INR, PROTIME in the last 168 hours.  CBC: Recent Labs  Lab 08/10/18 1848 08/10/18 1859 08/11/18 0530  WBC 6.3  --  4.7  NEUTROABS 4.2  --  2.4  HGB 13.2 12.2 10.9*  HCT 37.4 36.0 30.6*  MCV 90.8  --  91.9  PLT 269  --  198   Cardiac Enzymes: Recent Labs  Lab 08/10/18 1848 08/11/18 0152 08/11/18 0530 08/11/18 1227  TROPONINI 0.04* 0.03* 0.03* <0.03   BNP (last 3 results) No results for input(s): PROBNP in the last 8760 hours. CBG: Recent Labs  Lab 08/11/18 0812 08/12/18 0812 08/13/18 0729 08/14/18 0717  GLUCAP 114* 173* 110* 100*   D-Dimer: No results for input(s): DDIMER in the last 72 hours. Hgb A1c: No results for input(s): HGBA1C in the last 72 hours. Lipid Profile: No results for input(s): CHOL, HDL, LDLCALC, TRIG, CHOLHDL, LDLDIRECT in the last 72 hours. Thyroid function studies: No results for input(s): TSH, T4TOTAL, T3FREE, THYROIDAB in the last 72 hours.  Invalid input(s): FREET3 Anemia work up: No results for input(s): VITAMINB12, FOLATE, FERRITIN, TIBC, IRON, RETICCTPCT in the last 72 hours. Sepsis Labs: Recent Labs  Lab 08/10/18 1848 08/11/18 0530  WBC 6.3 4.7  LATICACIDVEN 1.2  --     Microbiology Recent Results (from the past 240 hour(s))  Gastrointestinal Panel by PCR , Stool     Status: None   Collection Time: 08/11/18  9:04 AM  Result Value Ref Range Status   Campylobacter species NOT DETECTED NOT DETECTED Final   Plesimonas shigelloides NOT DETECTED NOT DETECTED Final   Salmonella species NOT DETECTED NOT DETECTED Final   Yersinia enterocolitica NOT DETECTED NOT DETECTED Final   Vibrio species NOT DETECTED NOT DETECTED Final   Vibrio cholerae NOT DETECTED NOT DETECTED Final   Enteroaggregative E coli (EAEC) NOT DETECTED NOT DETECTED Final   Enteropathogenic  E coli (EPEC) NOT DETECTED NOT DETECTED Final   Enterotoxigenic E coli (ETEC) NOT DETECTED NOT DETECTED Final   Shiga like toxin producing E coli (STEC) NOT DETECTED NOT DETECTED Final   Shigella/Enteroinvasive E coli (EIEC) NOT DETECTED NOT DETECTED Final   Cryptosporidium NOT DETECTED NOT DETECTED Final   Cyclospora cayetanensis NOT DETECTED NOT DETECTED Final   Entamoeba histolytica NOT DETECTED NOT DETECTED Final   Giardia lamblia NOT DETECTED NOT DETECTED Final   Adenovirus F40/41 NOT DETECTED NOT DETECTED Final   Astrovirus NOT DETECTED NOT DETECTED Final   Norovirus GI/GII NOT DETECTED NOT DETECTED Final   Rotavirus A NOT DETECTED NOT DETECTED Final   Sapovirus (I, II, IV, and V) NOT DETECTED NOT DETECTED Final    Comment: Performed at [facility], [address] [place], [place] [address]  C difficile quick scan w PCR reflex     Status: None   Collection Time: 08/11/18  3:28 PM  Result Value Ref Range Status   C Diff antigen NEGATIVE NEGATIVE Final   C Diff toxin NEGATIVE NEGATIVE Final   C Diff interpretation No C. difficile detected.  Final    Comment: Performed at [facility], [place] [address] [place], [place] [address]     Medications:   . acidophilus   Oral Daily  . apixaban  5 mg Oral BID  . atorvastatin  10 mg Oral q1800  . busPIRone  5 mg Oral BID  .  citalopram  20 mg Oral Daily  . famotidine  10 mg Oral Daily  . levothyroxine  75 mcg Oral Q0600  . metoprolol succinate  50 mg Oral BID  . mirtazapine  7.5 mg Oral QHS  . pantoprazole  40 mg Oral Daily  . psyllium  1 packet Oral Daily  . sodium chloride flush  3 mL Intravenous Q12H   Continuous Infusions:     LOS: 5 days   Charlynne Cousins  [facility]  08/15/2018, 9:08 AM

## 2018-08-15 NOTE — Progress Notes (Addendum)
Progress Note  Patient Name: Teresa [name] Date of Encounter: 08/15/2018  Primary Cardiologist: Hochrein  Subjective   No complaints  Inpatient Medications    Scheduled Meds: . acidophilus   Oral Daily  . apixaban  5 mg Oral BID  . atorvastatin  10 mg Oral q1800  . busPIRone  5 mg Oral BID  . citalopram  20 mg Oral Daily  . famotidine  10 mg Oral Daily  . levothyroxine  75 mcg Oral Q0600  . metoprolol succinate  50 mg Oral BID  . mirtazapine  7.5 mg Oral QHS  . pantoprazole  40 mg Oral Daily  . psyllium  1 packet Oral Daily  . sodium chloride flush  3 mL Intravenous Q12H   Continuous Infusions:  PRN Meds: acetaminophen **OR** acetaminophen, albuterol, diphenhydrAMINE, furosemide, HYDROcodone-acetaminophen, metoprolol tartrate, QUEtiapine   Vital Signs    Vitals:   08/14/18 1513 08/14/18 2027 08/15/18 0429 08/15/18 0815  BP: 112/71 106/79 (!) 123/102   Pulse:  87 (!) 113 (!) 50  Resp:  15 19   Temp: 97.7 F (36.5 C) 99 F (37.2 C) (!) 97.3 F (36.3 C)   TempSrc: Oral  Oral   SpO2: 99%  100% 99%  Weight:   97 kg   Height:        Intake/Output Summary (Last 24 hours) at 08/15/2018 0916 Last data filed at 08/15/2018 0430 Gross per 24 hour  Intake 240 ml  Output 350 ml  Net -110 ml   Last 3 Weights 08/15/2018 08/14/2018 08/13/2018  Weight (lbs) 213 lb 12.8 oz 213 lb 213 lb 10 oz  Weight (kg) 96.979 kg 96.616 kg 96.9 kg      Telemetry    afib variable rates - Personally Reviewed  ECG    na  Physical Exam   GEN: No acute distress.   Neck: No JVD Cardiac: irreg, no murmurs, rubs, or gallops.  Respiratory: Clear to auscultation bilaterally. GI: Soft, nontender, non-distended  MS: No edema; No deformity. Neuro:  Nonfocal  Psych: Normal affect   Labs    Chemistry Recent Labs  Lab 08/10/18 1848  08/13/18 0657 08/14/18 0709 08/15/18 0336  NA 124*   < > 138 140 140  K 2.3*   < > 4.2 4.1 3.7  CL 85*   < > 111 115* 114*  CO2 11*   < > 17* 18* 18*   GLUCOSE 93   < > 107* 102* 105*  BUN 104*   < > 48* 33* 19  CREATININE 6.19*   < > 1.38* 1.20* 1.13*  CALCIUM 6.4*   < > 8.2* 8.1* 8.2*  PROT 6.8  --   --   --   --   ALBUMIN 3.7  --   --   --   --   AST 12*  --   --   --   --   ALT 11  --   --   --   --   ALKPHOS 70  --   --   --   --   BILITOT 1.4*  --   --   --   --   GFRNONAA 6*   < > 37* 44* 47*  GFRAA 7*   < > 43* 50* 54*  ANIONGAP 28*   < > 10 7 8    < > = values in this interval not displayed.     Hematology Recent Labs  Lab 08/10/18 1848 08/10/18 1859 08/11/18 0530  WBC 6.3  --  4.7  RBC 4.12  --  3.33*  HGB 13.2 12.2 10.9*  HCT 37.4 36.0 30.6*  MCV 90.8  --  91.9  MCH 32.0  --  32.7  MCHC 35.3  --  35.6  RDW 12.1  --  12.3  PLT 269  --  198    Cardiac Enzymes Recent Labs  Lab 08/10/18 1848 08/11/18 0152 08/11/18 0530 08/11/18 1227  TROPONINI 0.04* 0.03* 0.03* <0.03   No results for input(s): TROPIPOC in the last 168 hours.   BNPNo results for input(s): BNP, PROBNP in the last 168 hours.   DDimer No results for input(s): DDIMER in the last 168 hours.   Radiology    No results found.  Cardiac Studies     Patient Profile     Teresa [name] is a 78 y.o. female with a hx of prior LV dysfunction that had previously normalized who is being seen today for the evaluation of afib with RVR and acute systolic HF at the request of Dr Olevia Bowens  Assessment & Plan     1. Recurrent systolic HF - LVEF previously decrased, later normalized by 2016 echo. 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 Recent stress 2017 without ischemia.  - recurrent dysfunction detected this admission, LVEF 35%  - medical therapy limited by soft bp's. Consider additional agents once rate controlled afib, beta blocker current priority - actually presented hypovolemic due to significant diarrhea  - would plan to optimize management of her afib, optimize medical therapy for CHF, and let her  recover from her acute GI illness. I would not plan for inpatient ischemic testing, likely repeat echo in 3 months of medical therapy with controlled afib and decide after that if ischemic evaluation is warranted.   - continue current therapy. May be able to add ACE/ARB/ARNI once rate controled   2. Afib - new diagnosis this admission based on   records, though family reports she may have had before  - plan for TEE/DCCV Monday in setting of afib with RVR, difficult to control rates due to low bp's, and new systolic HF.   - we have changed from coreg to Toprol for rate control with less bp effects, follow rates today, this AM first dose.      3. Diarrhea/AKI/Hyponatremia - reason for admission, resolved  For questions or updates, please contact [place] Please consult [email or web address] for contact info under        Signed, Carlyle Dolly, MD  08/15/2018, 9:16 AM

## 2018-08-16 LAB — PROTIME-INR
INR: 1.5 — ABNORMAL HIGH (ref 0.8–1.2)
Prothrombin Time: 18.2 seconds — ABNORMAL HIGH (ref 11.4–15.2)

## 2018-08-16 LAB — BASIC METABOLIC PANEL
Anion gap: 6 (ref 5–15)
BUN: 14 mg/dL (ref 8–23)
CO2: 20 mmol/L — ABNORMAL LOW (ref 22–32)
Calcium: 7.8 mg/dL — ABNORMAL LOW (ref 8.9–10.3)
Chloride: 114 mmol/L — ABNORMAL HIGH (ref 98–111)
Creatinine, Ser: 1.07 mg/dL — ABNORMAL HIGH (ref 0.44–1.00)
GFR calc non Af Amer: 50 mL/min — ABNORMAL LOW (ref >60)
GFR, EST AFRICAN AMERICAN: 58 mL/min — AB (ref >60)
Glucose, Bld: 100 mg/dL — ABNORMAL HIGH (ref 70–99)
Potassium: 3.6 mmol/L (ref 3.5–5.1)
Sodium: 140 mmol/L (ref 135–145)

## 2018-08-16 LAB — GLUCOSE, CAPILLARY: Glucose-Capillary: 96 mg/dL (ref 70–99)

## 2018-08-16 MED ORDER — SODIUM CHLORIDE 0.9 % IV SOLN: INTRAVENOUS | Status: DC

## 2018-08-16 MED ORDER — SODIUM CHLORIDE 0.9% FLUSH
3.0000 mL | Freq: Two times a day (BID) | INTRAVENOUS | Status: DC
  Administered 2018-08-17: 3 mL via INTRAVENOUS

## 2018-08-16 MED ORDER — ZOLPIDEM TARTRATE 5 MG PO TABS
5.0000 mg | ORAL_TABLET | Freq: Once | ORAL | Status: AC
  Administered 2018-08-16: 5 mg via ORAL
  Filled 2018-08-16: qty 1

## 2018-08-16 MED ORDER — SODIUM CHLORIDE 0.9% FLUSH: 3.0000 mL | INTRAVENOUS | Status: DC | PRN

## 2018-08-16 MED ORDER — SODIUM CHLORIDE 0.9 % IV SOLN: 250.0000 mL | INTRAVENOUS | Status: DC

## 2018-08-16 NOTE — Progress Notes (Signed)
[name]    Progress Note  Teresa [name]  TWS:[id number] DOB: Oct 03, 1940 DOA: 08/10/2018 PCP: [name], MD     Brief Narrative:   Teresa [name] is an 78 y.o. female past medical history significant for hypothyroidism depression, anxiety presents to the emergency room for evaluation of generalized weakness, comes into the hospital post several episodes of watery diarrhea no fever chills abdominal pain or vomiting.  Assessment/Plan:   Acute renal failure (ARF) (HCC) metabolic acidosis with an increased anion gap: Likely prerenal azotemia in the setting of ARB and diuretic use and diarrhea. With a baseline creatinine of less than 1. KVO IV fluids her creatinine has returned to baseline. Continue diuretic therapy.  Watery  diarrhea: Resolved.  Severe hypokalemia: Due to diarrhea resolved with oral supplementation. Check a magnesium level.  Hypotension: Resolved with IV fluids. Continue to hold diuretics and ARB due to hypotension.  Hypovolemic hyponatremia: Resolved. Likely due to diarrhea.  Hypocalcemia: Replete calcium orally recheck in the morning.  Elevated troponins: She denies any chest pain EKG showed no signs of ischemia. Cardiac biomarkers have remained flat.  Hypothyroidism: Continue Synthroid.  Depression/anxiety: Resume BuSpar and Remeron. Continue Celexa.  Prolonged QTc: Now resolved.  New onset atrial fibrillation with RVR: CHADS-VASc is at least 4, continue Eliquis. On metoprolol and apixaban, cardiology was consulted who recommended DC cardioversion on 08/16/2018  New onset acute systolic heart failure nonischemic: Continue metoprolol and Lasix.  Acute confusional state: Likely due to Benadryl. Avoid narcotics Ativan or Benadryl.  Use Seroquel at bedtime.     DVT prophylaxis: heparin Family Communication:none Disposition Plan/Barrier to D/C: home in am Code Status:     Code Status Orders  (From  admission, onward)         Start     Ordered   08/10/18 2043  Full code  Continuous     08/10/18 2050        Code Status History    Date Active Date Inactive Code Status Order ID Comments User Context   02/04/2013 2124 02/07/2013 1631 Full Code [id number]  Excell Seltzer, MD Inpatient   09/13/2012 1520 09/14/2012 1647 Full Code [id number]  Rolm Bookbinder, MD Inpatient   04/20/2011 0006 05/02/2011 1552 Full Code [id number]  Long, Gilles Chiquito, RN Inpatient        IV Access:    Peripheral IV   Procedures and diagnostic studies:   No results found.   Medical Consultants:    None.  Anti-Infectives:   None  Subjective:    Teresa [name] is any chest pain feels great.   Objective:    Vitals:   08/15/18 1927 08/16/18 0432 08/16/18 1104 08/16/18 1107  BP: 99/75 129/68 126/73 126/73  Pulse: (!) 129 (!) 110 (!) 101   Resp: 18 19    Temp: 97.6 F (36.4 C) (!) 97.5 F (36.4 C)    TempSrc: Oral Oral    SpO2: 99% 95%    Weight:  96.9 kg    Height:        Intake/Output Summary (Last 24 hours) at 08/16/2018 1203 Last data filed at 08/15/2018 2100 Gross per 24 hour  Intake -  Output 100 ml  Net -100 ml   Filed Weights   08/14/18 0418 08/15/18 0429 08/16/18 0432  Weight: 96.6 kg 97 kg 96.9 kg    Exam: General exam: In no acute distress. Respiratory system: Good air movement and clear to auscultation. Cardiovascular system: S1 & S2 heard, RRR.  Negative JVD. Gastrointestinal system: Abdomen is nondistended, soft and nontender.  Central nervous system: Alert and oriented. No focal neurological deficits. Extremities: No pedal edema. Skin: No rashes, lesions or ulcers Psychiatry: Judgement and insight appear normal. Mood & affect appropriate.    Data Reviewed:    Labs: Basic Metabolic Panel: Recent Labs  Lab 08/10/18 1848  08/12/18 2951 08/13/18 0657 08/14/18 0709 08/15/18 0336 08/16/18 0353  NA 124*   < > 137 138 140 140 140  K 2.3*   < > 3.1* 4.2  4.1 3.7 3.6  CL 85*   < > 106 111 115* 114* 114*  CO2 11*   < > 22 17* 18* 18* 20*  GLUCOSE 93   < > 133* 107* 102* 105* 100*  BUN 104*   < > 58* 48* 33* 19 14  CREATININE 6.19*   < > 1.75* 1.38* 1.20* 1.13* 1.07*  CALCIUM 6.4*   < > 7.4* 8.2* 8.1* 8.2* 7.8*  MG 2.5*  --   --  2.1  --   --   --    < > = values in this interval not displayed.   GFR Estimated Creatinine Clearance: 48.8 mL/min (A) (by C-G formula based on SCr of 1.07 mg/dL (H)). Liver Function Tests: Recent Labs  Lab 08/10/18 1848  AST 12*  ALT 11  ALKPHOS 70  BILITOT 1.4*  PROT 6.8  ALBUMIN 3.7   No results for input(s): LIPASE, AMYLASE in the last 168 hours. No results for input(s): AMMONIA in the last 168 hours. Coagulation profile Recent Labs  Lab 08/16/18 0353  INR 1.5*    CBC: Recent Labs  Lab 08/10/18 1848 08/10/18 1859 08/11/18 0530  WBC 6.3  --  4.7  NEUTROABS 4.2  --  2.4  HGB 13.2 12.2 10.9*  HCT 37.4 36.0 30.6*  MCV 90.8  --  91.9  PLT 269  --  198   Cardiac Enzymes: Recent Labs  Lab 08/10/18 1848 08/11/18 0152 08/11/18 0530 08/11/18 1227  TROPONINI 0.04* 0.03* 0.03* <0.03   BNP (last 3 results) No results for input(s): PROBNP in the last 8760 hours. CBG: Recent Labs  Lab 08/12/18 0812 08/13/18 0729 08/14/18 0717 08/15/18 0934 08/16/18 0737  GLUCAP 173* 110* 100* 110* 96   D-Dimer: No results for input(s): DDIMER in the last 72 hours. Hgb A1c: No results for input(s): HGBA1C in the last 72 hours. Lipid Profile: No results for input(s): CHOL, HDL, LDLCALC, TRIG, CHOLHDL, LDLDIRECT in the last 72 hours. Thyroid function studies: No results for input(s): TSH, T4TOTAL, T3FREE, THYROIDAB in the last 72 hours.  Invalid input(s): FREET3 Anemia work up: No results for input(s): VITAMINB12, FOLATE, FERRITIN, TIBC, IRON, RETICCTPCT in the last 72 hours. Sepsis Labs: Recent Labs  Lab 08/10/18 1848 08/11/18 0530  WBC 6.3 4.7  LATICACIDVEN 1.2  --     Microbiology Recent Results (from the past 240 hour(s))  Gastrointestinal Panel by PCR , Stool     Status: None   Collection Time: 08/11/18  9:04 AM  Result Value Ref Range Status   Campylobacter species NOT DETECTED NOT DETECTED Final   Plesimonas shigelloides NOT DETECTED NOT DETECTED Final   Salmonella species NOT DETECTED NOT DETECTED Final   Yersinia enterocolitica NOT DETECTED NOT DETECTED Final   Vibrio species NOT DETECTED NOT DETECTED Final   Vibrio cholerae NOT DETECTED NOT DETECTED Final   Enteroaggregative E coli (EAEC) NOT DETECTED NOT DETECTED Final   Enteropathogenic E coli (EPEC) NOT DETECTED  NOT DETECTED Final   Enterotoxigenic E coli (ETEC) NOT DETECTED NOT DETECTED Final   Shiga like toxin producing E coli (STEC) NOT DETECTED NOT DETECTED Final   Shigella/Enteroinvasive E coli (EIEC) NOT DETECTED NOT DETECTED Final   Cryptosporidium NOT DETECTED NOT DETECTED Final   Cyclospora cayetanensis NOT DETECTED NOT DETECTED Final   Entamoeba histolytica NOT DETECTED NOT DETECTED Final   Giardia lamblia NOT DETECTED NOT DETECTED Final   Adenovirus F40/41 NOT DETECTED NOT DETECTED Final   Astrovirus NOT DETECTED NOT DETECTED Final   Norovirus GI/GII NOT DETECTED NOT DETECTED Final   Rotavirus A NOT DETECTED NOT DETECTED Final   Sapovirus (I, II, IV, and V) NOT DETECTED NOT DETECTED Final    Comment: Performed at [facility], [address] [place], [place] [address]  C difficile quick scan w PCR reflex     Status: None   Collection Time: 08/11/18  3:28 PM  Result Value Ref Range Status   C Diff antigen NEGATIVE NEGATIVE Final   C Diff toxin NEGATIVE NEGATIVE Final   C Diff interpretation No C. difficile detected.  Final    Comment: Performed at [facility], [place] [address] [place], [place] [address]     Medications:   . acidophilus   Oral Daily  . apixaban  5 mg Oral BID  . atorvastatin  10 mg Oral q1800  . busPIRone  5 mg Oral BID  .  citalopram  20 mg Oral Daily  . famotidine  10 mg Oral Daily  . levothyroxine  75 mcg Oral Q0600  . metoprolol succinate  50 mg Oral BID  . mirtazapine  7.5 mg Oral QHS  . pantoprazole  40 mg Oral Daily  . psyllium  1 packet Oral Daily  . sodium chloride flush  3 mL Intravenous Q12H  . sodium chloride flush  3 mL Intravenous Q12H   Continuous Infusions: . sodium chloride        LOS: 6 days   Charlynne Cousins  [facility]  08/16/2018, 12:03 PM

## 2018-08-16 NOTE — Progress Notes (Addendum)
    [facility] has been requested to perform a transesophageal echocardiogram on Teresa [name] for atrial fibrillation prior to DCCV.  After careful review of history and examination, the risks and benefits of transesophageal echocardiogram have been explained including risks of esophageal damage, perforation (1:10,000 risk), bleeding, pharyngeal hematoma as well as other potential complications associated with conscious sedation including aspiration, arrhythmia, respiratory failure and death. Alternatives to treatment were discussed, questions were answered. Patient is willing to proceed.   Schedule is full for today, I have given her a diet. Will plan for TEE/DCCV tomorrow. NPO at [place], please.   Tami Lin [place], [place]  08/16/2018 11:42 AM

## 2018-08-16 NOTE — Progress Notes (Signed)
Progress Note  Patient Name: Teresa [name] Date of Encounter: 08/16/2018  Primary Cardiologist:   No primary care provider on file.   Subjective   Breathing OK.  Wants to go home.    Inpatient Medications    Scheduled Meds: . acidophilus   Oral Daily  . apixaban  5 mg Oral BID  . atorvastatin  10 mg Oral q1800  . busPIRone  5 mg Oral BID  . citalopram  20 mg Oral Daily  . famotidine  10 mg Oral Daily  . levothyroxine  75 mcg Oral Q0600  . metoprolol succinate  50 mg Oral BID  . mirtazapine  7.5 mg Oral QHS  . pantoprazole  40 mg Oral Daily  . psyllium  1 packet Oral Daily  . sodium chloride flush  3 mL Intravenous Q12H  . sodium chloride flush  3 mL Intravenous Q12H   Continuous Infusions: . sodium chloride    . sodium chloride 20 mL/hr at 08/16/18 0630   PRN Meds: acetaminophen **OR** acetaminophen, albuterol, diphenhydrAMINE, furosemide, HYDROcodone-acetaminophen, metoprolol tartrate, QUEtiapine, sodium chloride flush   Vital Signs    Vitals:   08/15/18 0936 08/15/18 1429 08/15/18 1927 08/16/18 0432  BP:  (!) 141/77 99/75 129/68  Pulse: (!) 113 (!) 103 (!) 129 (!) 110  Resp:   18 19  Temp:  98.5 F (36.9 C) 97.6 F (36.4 C) (!) 97.5 F (36.4 C)  TempSrc:  Oral Oral Oral  SpO2:  99% 99% 95%  Weight:    96.9 kg  Height:        Intake/Output Summary (Last 24 hours) at 08/16/2018 0829 Last data filed at 08/15/2018 2100 Gross per 24 hour  Intake -  Output 100 ml  Net -100 ml   Filed Weights   08/14/18 0418 08/15/18 0429 08/16/18 0432  Weight: 96.6 kg 97 kg 96.9 kg    Telemetry    Atrial fib, rate not well controlled.  - Personally Reviewed  ECG    NA - Personally Reviewed  Physical Exam   GEN: No acute distress.   Neck: No  JVD Cardiac: Irregular, no murmurs, rubs, or gallops.  Respiratory: Clear  to auscultation bilaterally. GI: Soft, nontender, non-distended  MS: No  edema; No deformity. Neuro:  Nonfocal  Psych: Normal affect   Labs    Chemistry Recent Labs  Lab 08/10/18 1848  08/14/18 0709 08/15/18 0336 08/16/18 0353  NA 124*   < > 140 140 140  K 2.3*   < > 4.1 3.7 3.6  CL 85*   < > 115* 114* 114*  CO2 11*   < > 18* 18* 20*  GLUCOSE 93   < > 102* 105* 100*  BUN 104*   < > 33* 19 14  CREATININE 6.19*   < > 1.20* 1.13* 1.07*  CALCIUM 6.4*   < > 8.1* 8.2* 7.8*  PROT 6.8  --   --   --   --   ALBUMIN 3.7  --   --   --   --   AST 12*  --   --   --   --   ALT 11  --   --   --   --   ALKPHOS 70  --   --   --   --   BILITOT 1.4*  --   --   --   --   GFRNONAA 6*   < > 44* 47* 50*  GFRAA 7*   < >  50* 54* 58*  ANIONGAP 28*   < > 7 8 6    < > = values in this interval not displayed.     Hematology Recent Labs  Lab 08/10/18 1848 08/10/18 1859 08/11/18 0530  WBC 6.3  --  4.7  RBC 4.12  --  3.33*  HGB 13.2 12.2 10.9*  HCT 37.4 36.0 30.6*  MCV 90.8  --  91.9  MCH 32.0  --  32.7  MCHC 35.3  --  35.6  RDW 12.1  --  12.3  PLT 269  --  198    Cardiac Enzymes Recent Labs  Lab 08/10/18 1848 08/11/18 0152 08/11/18 0530 08/11/18 1227  TROPONINI 0.04* 0.03* 0.03* <0.03   No results for input(s): TROPIPOC in the last 168 hours.   BNPNo results for input(s): BNP, PROBNP in the last 168 hours.   DDimer No results for input(s): DDIMER in the last 168 hours.   Radiology    No results found.  Cardiac Studies   ECHO:   1. The left ventricle has moderately reduced systolic function, with an ejection fraction of 35-40%. The cavity size was normal. There is mildly increased left ventricular wall thickness. Left ventricular diastology could not be evaluated secondary to  atrial fibrillation. Left ventricular diffuse hypokinesis.  2. The right ventricle has normal systolic function. The cavity was normal. There is no increase in right ventricular wall thickness.  3. Left atrial size was moderately dilated.  4. Right atrial size was mildly dilated.  5. The mitral valve is degenerative. Mild thickening of the  mitral valve leaflet.  6. The aortic valve was not well visualized Mild calcification of the aortic valve. Aortic valve regurgitation is mild by color flow Doppler. The jet is posteriorly-directed no stenosis of the aortic valve.  7. The aortic root and ascending aorta are normal in size and structure.   Patient Profile     78 y.o. female  with a hx of prior LV dysfunction that had previously normalized who is being seen for the evaluation of afib with RVR and acute systolic HF a  Assessment & Plan    ACUTE SYSTOLIC HF:    Exacerbated by acute GI illness.  Positive 4 liters this admission.  Stop maintenance fluid.    Likely will need to restart low dose diuretic before discharge.  Will likely be able to restart ARB as well.    ATRIAL FIB:    She is on Eliquis and Toprol.   TEE/DCCV.   We are checking on scheduling to see if she can get this today or whether this needs to be tomorrow.    AKI:   Creat is much improved and back to baseline.   Follow.       For questions or updates, please contact [place] Please consult [email or web address] for contact info under Cardiology/STEMI.   Signed, Minus Breeding, MD  08/16/2018, 8:29 AM

## 2018-08-17 ENCOUNTER — Encounter (HOSPITAL_COMMUNITY)
Admission: EM | Disposition: A | Discharge status: Home / Self Care | Payer: Self-pay | Source: Home / Self Care | Attending: Internal Medicine

## 2018-08-17 ENCOUNTER — Encounter (HOSPITAL_COMMUNITY): Payer: Self-pay | Admitting: Certified Registered Nurse Anesthetist

## 2018-08-17 ENCOUNTER — Inpatient Hospital Stay (HOSPITAL_COMMUNITY): Payer: Medicare Other | Admitting: Anesthesiology

## 2018-08-17 ENCOUNTER — Telehealth: Payer: Self-pay | Admitting: Physician Assistant

## 2018-08-17 ENCOUNTER — Inpatient Hospital Stay (HOSPITAL_COMMUNITY): Payer: Medicare Other

## 2018-08-17 DIAGNOSIS — I5021 Acute systolic (congestive) heart failure: Secondary | ICD-10-CM

## 2018-08-17 DIAGNOSIS — I340 Nonrheumatic mitral (valve) insufficiency: Secondary | ICD-10-CM

## 2018-08-17 HISTORY — PX: TEE WITHOUT CARDIOVERSION: SHX5443

## 2018-08-17 HISTORY — PX: CARDIOVERSION: SHX1299

## 2018-08-17 LAB — GLUCOSE, CAPILLARY: Glucose-Capillary: 101 mg/dL — ABNORMAL HIGH (ref 70–99)

## 2018-08-17 SURGERY — ECHOCARDIOGRAM, TRANSESOPHAGEAL
Anesthesia: Monitor Anesthesia Care

## 2018-08-17 MED ORDER — PROPOFOL 500 MG/50ML IV EMUL
INTRAVENOUS | Status: DC | PRN
  Administered 2018-08-17: 50 ug/kg/min via INTRAVENOUS

## 2018-08-17 MED ORDER — METOPROLOL SUCCINATE ER 50 MG PO TB24: 50.0000 mg | ORAL_TABLET | Freq: Two times a day (BID) | ORAL | 3 refills | Status: DC

## 2018-08-17 MED ORDER — LACTATED RINGERS IV SOLN
INTRAVENOUS | Status: DC
  Administered 2018-08-17: 09:00:00 via INTRAVENOUS

## 2018-08-17 MED ORDER — ALBUTEROL SULFATE (2.5 MG/3ML) 0.083% IN NEBU
INHALATION_SOLUTION | RESPIRATORY_TRACT | Status: AC
  Filled 2018-08-17: qty 3

## 2018-08-17 MED ORDER — PROPOFOL 10 MG/ML IV BOLUS
INTRAVENOUS | Status: DC | PRN
  Administered 2018-08-17 (×2): 20 mg via INTRAVENOUS

## 2018-08-17 NOTE — Telephone Encounter (Signed)
Hi TOC pool. This patient will need a TOC phone call after discharge - going home today. F/u has been arranged 08/25/18 with Arnold Long. It's a patient of Dr. Rosezella [place].  Thanks!

## 2018-08-17 NOTE — Progress Notes (Signed)
Progress Note  Patient Name: Teresa [name] Date of Encounter: 08/17/2018  Primary Cardiologist:   No primary care provider on file.   Subjective   No chest pain.  No SOB.   Inpatient Medications    Scheduled Meds: . acidophilus   Oral Daily  . apixaban  5 mg Oral BID  . atorvastatin  10 mg Oral q1800  . busPIRone  5 mg Oral BID  . citalopram  20 mg Oral Daily  . famotidine  10 mg Oral Daily  . levothyroxine  75 mcg Oral Q0600  . metoprolol succinate  50 mg Oral BID  . mirtazapine  7.5 mg Oral QHS  . pantoprazole  40 mg Oral Daily  . psyllium  1 packet Oral Daily  . sodium chloride flush  3 mL Intravenous Q12H  . sodium chloride flush  3 mL Intravenous Q12H   Continuous Infusions: . sodium chloride     PRN Meds: acetaminophen **OR** acetaminophen, albuterol, diphenhydrAMINE, furosemide, HYDROcodone-acetaminophen, metoprolol tartrate, QUEtiapine, sodium chloride flush   Vital Signs    Vitals:   08/17/18 1035 08/17/18 1040 08/17/18 1045 08/17/18 1050  BP: 101/81   114/62  Pulse: (!) 50   (!) 48  Resp: (!) 23   20  Temp:      TempSrc:      SpO2:  100% 100% 100%  Weight:      Height:        Intake/Output Summary (Last 24 hours) at 08/17/2018 1146 Last data filed at 08/17/2018 1010 Gross per 24 hour  Intake 590 ml  Output -  Net 590 ml   Filed Weights   08/15/18 0429 08/16/18 0432 08/17/18 0827  Weight: 97 kg 96.9 kg 96.9 kg    Telemetry    NSR with frequent atrial ectopy..  - Personally Reviewed  ECG    NA - Personally Reviewed  Physical Exam   GEN: No  acute distress.   Neck: No  JVD Cardiac: RRR, no murmurs, rubs, or gallops.  Respiratory: Clear   to auscultation bilaterally. GI: Soft, nontender, non-distended, normal bowel sounds  MS:  No edema; No deformity. Neuro:   Nonfocal  Psych: Oriented and appropriate    Labs    Chemistry Recent Labs  Lab 08/10/18 1848  08/14/18 0709 08/15/18 0336 08/16/18 0353  NA 124*   < > 140 140 140    K 2.3*   < > 4.1 3.7 3.6  CL 85*   < > 115* 114* 114*  CO2 11*   < > 18* 18* 20*  GLUCOSE 93   < > 102* 105* 100*  BUN 104*   < > 33* 19 14  CREATININE 6.19*   < > 1.20* 1.13* 1.07*  CALCIUM 6.4*   < > 8.1* 8.2* 7.8*  PROT 6.8  --   --   --   --   ALBUMIN 3.7  --   --   --   --   AST 12*  --   --   --   --   ALT 11  --   --   --   --   ALKPHOS 70  --   --   --   --   BILITOT 1.4*  --   --   --   --   GFRNONAA 6*   < > 44* 47* 50*  GFRAA 7*   < > 50* 54* 58*  ANIONGAP 28*   < > 7 8 6    < > =  values in this interval not displayed.     Hematology Recent Labs  Lab 08/10/18 1848 08/10/18 1859 08/11/18 0530  WBC 6.3  --  4.7  RBC 4.12  --  3.33*  HGB 13.2 12.2 10.9*  HCT 37.4 36.0 30.6*  MCV 90.8  --  91.9  MCH 32.0  --  32.7  MCHC 35.3  --  35.6  RDW 12.1  --  12.3  PLT 269  --  198    Cardiac Enzymes Recent Labs  Lab 08/10/18 1848 08/11/18 0152 08/11/18 0530 08/11/18 1227  TROPONINI 0.04* 0.03* 0.03* <0.03   No results for input(s): TROPIPOC in the last 168 hours.   BNPNo results for input(s): BNP, PROBNP in the last 168 hours.   DDimer No results for input(s): DDIMER in the last 168 hours.   Radiology    No results found.  Cardiac Studies   ECHO:   1. The left ventricle has moderately reduced systolic function, with an ejection fraction of 35-40%. The cavity size was normal. There is mildly increased left ventricular wall thickness. Left ventricular diastology could not be evaluated secondary to  atrial fibrillation. Left ventricular diffuse hypokinesis.  2. The right ventricle has normal systolic function. The cavity was normal. There is no increase in right ventricular wall thickness.  3. Left atrial size was moderately dilated.  4. Right atrial size was mildly dilated.  5. The mitral valve is degenerative. Mild thickening of the mitral valve leaflet.  6. The aortic valve was not well visualized Mild calcification of the aortic valve. Aortic valve  regurgitation is mild by color flow Doppler. The jet is posteriorly-directed no stenosis of the aortic valve.  7. The aortic root and ascending aorta are normal in size and structure.   Procedure:  Transesophageal echocardiogram Operator:  Fransico Him, MD  Results: Normal LV size and mildly reduced LV function with EF 40-45%. Normal RV size and function Normal RA Moderately dilated  LA and normal LA appendage with no evidence of thrombus and normal emptying velocity Normal TV mild to moderate TR Normal PV with trivial PR Normal MV with 3+ MR that wraps around the LA and to the ostium of the pulmonary vein but no flow reversal into the pulmonary vein.   Mildly sclerotic trileaflet AV Focal lipomatous hypertrophy of the interatrial septum with no evidence of shunt by colorflow dopper  Mild atherosclerosis of the  thoracic and ascending aorta.  The patient tolerated the procedure well and went on to DCCV.   Patient Profile     78 y.o. female  with a hx of prior LV dysfunction that had previously normalized who is being seen for the evaluation of afib with RVR and acute systolic HF   Assessment & Plan    ACUTE SYSTOLIC HF:    Exacerbated by acute GI illness.  Positive 4.6 liters this admission.  Stopped maintenance fluid.    I will restart a very low dose Lasix unless she continues to have diarrhea.  I will not resume the Cozaar but will likely start as an out patient.    ATRIAL FIB:    She is on Eliquis and Toprol.   TEE/DCCV completed.  NSR but high likelihood of returning to fib with MR.  See below  MR:  Thought to be moderately severe as above.   I will follow this with echo although it will be difficult because she does not have a murmur and this was not obvious on TTE.  AKI:   Creat is much improved and back to baseline.  Can continue to follow as an out patient.      For questions or updates, please contact [place] Please consult [email or web address] for contact info  under Cardiology/STEMI.   Signed, Minus Breeding, MD  08/17/2018, 11:46 AM

## 2018-08-17 NOTE — Progress Notes (Signed)
  Echocardiogram Echocardiogram Transesophageal has been performed.  Teresa [name] G Nicholas Trompeter 08/17/2018, 10:35 AM

## 2018-08-17 NOTE — Transfer of Care (Signed)
Immediate Anesthesia Transfer of Care Note  Patient: Teresa [name]  Procedure(s) Performed: TRANSESOPHAGEAL ECHOCARDIOGRAM (TEE) (N/A ) CARDIOVERSION (N/A )  Patient Location: Endoscopy Unit  Anesthesia Type:MAC  Level of Consciousness: awake, alert  and oriented  Airway & Oxygen Therapy: Patient Spontanous Breathing and Patient connected to nasal cannula oxygen  Post-op Assessment: Report given to RN and Post -op Vital signs reviewed and stable  Post vital signs: Reviewed and stable  Last Vitals:  Vitals Value Taken Time  BP 96/47 08/17/2018 10:14 AM  Temp    Pulse    Resp 26 08/17/2018 10:14 AM  SpO2    Vitals shown include unvalidated device data.  Last Pain:  Vitals:   08/17/18 0827  TempSrc: Oral  PainSc: 0-No pain      Patients Stated Pain Goal: 0 (29/56/21 3086)  Complications: No apparent anesthesia complications

## 2018-08-17 NOTE — Progress Notes (Signed)
Discharge AVS given to pt. Went over it multiple times. No family present. Pt said son does meds and she will get him the AVS paper work for updated info. Son is not coming to floor on discharge, will pick up at entrance. Encourage pt if she had questions or problems to please call MD office or return to hospital. Carroll Kinds RN

## 2018-08-17 NOTE — Anesthesia Procedure Notes (Signed)
Procedure Name: MAC Date/Time: 08/17/2018 9:32 AM Performed by: Candis Shine, CRNA Pre-anesthesia Checklist: Patient identified, Emergency Drugs available, Suction available, Patient being monitored and Timeout performed Patient Re-evaluated:Patient Re-evaluated prior to induction Oxygen Delivery Method: Nasal cannula Dental Injury: Teeth and Oropharynx as per pre-operative assessment

## 2018-08-17 NOTE — CV Procedure (Signed)
    PROCEDURE NOTE:  Procedure:  Transesophageal echocardiogram Operator:  Fransico Him, MD Indications:  Atrial fibrillation Complications: None  During this procedure the patient is administered a total of Propofol  200 mg to achieve and maintain moderate conscious sedation.  The patient's heart rate, blood pressure, and oxygen saturation are monitored continuously during the procedure by anesthesia.  Results: Normal LV size and mildly reduced LV function with EF 40-45%. Normal RV size and function Normal RA Moderately dilated  LA and normal LA appendage with no evidence of thrombus and normal emptying velocity Normal TV mild to moderate TR Normal PV with trivial PR Normal MV with 3+ MR that wraps around the LA and to the ostium of the pulmonary vein but no flow reversal into the pulmonary vein.   Mildly sclerotic trileaflet AV Focal lipomatous hypertrophy of the interatrial septum with no evidence of shunt by colorflow dopper  Mild atherosclerosis of the  thoracic and ascending aorta.  The patient tolerated the procedure well and went on to DCCV.  Signed: Fransico Him, MD [facility]    Electrical Cardioversion Procedure Note Teresa [name] [id number] 1941/04/03  Procedure: Electrical Cardioversion Indications:  Atrial Fibrillation  Time Out: Verified patient identification, verified procedure,medications/allergies/relevent history reviewed, required imaging and test results available.  Performed  Procedure Details  The patient was NPO after midnight. Anesthesia was administered at the beside  by Dr. Daiva Huge.  Cardioversion was done with synchronized biphasic defibrillation with AP pads with 150watts.  The patient failed to convert to NSR and a second cardioversion was done with synchronized biphasic defibrillation with AP pads with 200watts converted to normal sinus rhythm. The patient tolerated the procedure well   IMPRESSION:  Successful cardioversion of atrial  fibrillation    Teresa [name] 08/17/2018, 10:21 AM

## 2018-08-17 NOTE — Interval H&P Note (Signed)
History and Physical Interval Note:  08/17/2018 9:41 AM  Teresa [name]  has presented today for surgery, with the diagnosis of a fib  The various methods of treatment have been discussed with the patient and family. After consideration of risks, benefits and other options for treatment, the patient has consented to  Procedure(s): TRANSESOPHAGEAL ECHOCARDIOGRAM (TEE) (N/A) CARDIOVERSION (N/A) as a surgical intervention .  The patient's history has been reviewed, patient examined, no change in status, stable for surgery.  I have reviewed the patient's chart and labs.  Questions were answered to the patient's satisfaction.     Fransico Him

## 2018-08-17 NOTE — Anesthesia Preprocedure Evaluation (Addendum)
Anesthesia Evaluation  Patient identified by MRN, date of birth, ID band Patient awake    Reviewed: Allergy & Precautions, NPO status , Patient's Chart, lab work & pertinent test results  History of Anesthesia Complications Negative for: history of anesthetic complications  Airway Mallampati: II  TM Distance: >3 FB Neck ROM: Full    Dental  (+) Dental Advisory Given, Upper Dentures, Poor Dentition, Missing,    Pulmonary former smoker,    Pulmonary exam normal breath sounds clear to auscultation       Cardiovascular hypertension, Pt. on medications +CHF  Normal cardiovascular exam Rhythm:Irregular Rate:Normal  TTE 08/13/18: EF 35-40%, left ventricular diffuse hypokinesis, mod LAE, mild RAE, mild AVR    Neuro/Psych Anxiety Depression negative neurological ROS     GI/Hepatic Neg liver ROS, hiatal hernia, PUD, GERD  Controlled and Medicated,  Endo/Other  diabetes, Type 2Hypothyroidism   Renal/GU Renal InsufficiencyRenal disease     Musculoskeletal  (+) Arthritis ,   Abdominal   Peds  Hematology negative hematology ROS (+)   Anesthesia Other Findings Day of surgery medications reviewed with the patient.  Reproductive/Obstetrics                            Anesthesia Physical Anesthesia Plan  ASA: III  Anesthesia Plan: MAC   Post-op Pain Management:    Induction:   PONV Risk Score and Plan: Treatment may vary due to age or medical condition and Propofol infusion  Airway Management Planned: Natural Airway and Nasal Cannula  Additional Equipment:   Intra-op Plan:   Post-operative Plan:   Informed Consent: I have reviewed the patients History and Physical, chart, labs and discussed the procedure including the risks, benefits and alternatives for the proposed anesthesia with the patient or authorized representative who has indicated his/her understanding and acceptance.     Dental  advisory given  Plan Discussed with: CRNA  Anesthesia Plan Comments:        Anesthesia Quick Evaluation

## 2018-08-17 NOTE — Discharge Summary (Signed)
Physician Discharge Summary  Teresa [name] NWG:[id number] DOB: [birth year] DOA: 08/10/2018  PCP: [name], MD  Admit date: 08/10/2018 Discharge date: 08/17/2018  Admitted From: Home Disposition:  Home  Recommendations for Outpatient Follow-up:  1. Follow up with Cardiology in 1-2 weeks 2. Check b-met  Home Health:No Equipment/Devices:None  Discharge Condition:Ste[place] CODE STATUS: Stable Diet recommendation: Heart Healthy  Brief/Interim Summary: 78 y.o. female past medical history significant for hypothyroidism depression, anxiety presents to the emergency room for evaluation of generalized weakness, comes into the hospital post several episodes of watery diarrhea no fever chills abdominal pain or vomiting.  Discharge Diagnoses:  Principal Problem:   Acute renal failure (ARF) (HCC) Active Problems:   Diabetes mellitus type 2, diet-controlled (HCC)   Hypothyroidism   Chronic pain of both shoulders   Anxiety and depression   Hypotension due to hypovolemia   Hypokalemia   Hyponatremia   Metabolic acidosis, increased anion gap   Hypocalcemia   Elevated troponin   Prolonged QT interval   Atrial fibrillation with RVR (HCC)   Pressure injury of skin  Acute renal failure (ARF) (HCC) metabolic acidosis with an increased anion gap: Likely prerenal azotemia.  ACE inhibitor and diuretics were stopped on admission. She was started on aggressive IV fluid hydration. Her creatinine returned to baseline. Sh will be d/c low diuretic therapy as an outpatient.  Watery  diarrhea: C. difficile negative, GI panel negative. She was fluid resuscitated but she continues to have diarrhea she was started on oral Metamucil and probiotics and diarrhea resolved. No medications for home.  Severe hypokalemia: Due to diarrhea resolved with oral supplementation. Potassium remained stable.  Hypotension: Setting of diarrhea and antihypertensive medication. Resolved with IV  fluids.  Hypovolemic hyponatremia: Resolved IV fluids. Likely due to diarrhea.  Hypocalcemia: Replete calcium orally recheck in the morning.  Elevated troponins: She denies any chest pain EKG showed no signs of ischemia. Cardiac biomarkers have remained flat.  Hypothyroidism: Continue Synthroid.  Depression/anxiety: Resume BuSpar and Remeron. Continue Celexa.  New onset atrial fibrillation with aVR: CHADS-VASc is at least 4, she was started on Eliquis. Her Coreg was discontinued, she was noted on oral metoprolol. Cardiology was consulted they recommended DC cardioversion. Which was performed on 3 07/18/2018. She will continue home metoprolol and Eliquis.  We will follow-up with cardiology in 1 to 2 weeks.  Discharge Instructions  Discharge Instructions    Diet - low sodium heart healthy   Complete by:  As directed    Increase activity slowly   Complete by:  As directed      Allergies as of 08/17/2018      Reactions   Aspirin Other (See Comments)   HX Bleeding Ulcer    Nsaids Other (See Comments)   Hx of bleeding ulcers   Tolmetin Rash   Hx of bleeding ulcers      Medication List    STOP taking these medications   carvedilol 12.5 MG tablet Commonly known as:  COREG   losartan 25 MG tablet Commonly known as:  COZAAR   spironolactone 25 MG tablet Commonly known as:  ALDACTONE     TAKE these medications   AEROCHAMBER MV inhaler Use as instructed   atorvastatin 10 MG tablet Commonly known as:  LIPITOR Take 1 tablet (10 mg total) by mouth daily.   busPIRone 5 MG tablet Commonly known as:  BUSPAR Take 1 tablet (5 mg total) by mouth 2 (two) times daily.   citalopram 20 MG tablet Commonly known  as:  CELEXA TAKE 1 TABLET EACH DAY. What changed:  See the new instructions.   famotidine 20 MG tablet Commonly known as:  PEPCID Take 20 mg by mouth 2 (two) times daily.   furosemide 20 MG tablet Commonly known as:  LASIX Take 1 tablet (20 mg total) by  mouth daily as needed for fluid or edema.   HYDROcodone-acetaminophen 5-325 MG tablet Commonly known as:  NORCO/VICODIN Take 1 tablet by mouth 5 (five) times daily.   levothyroxine 75 MCG tablet Commonly known as:  SYNTHROID, LEVOTHROID TAKE 1 TABLET ONCE DAILY BEFORE BREAKFAST. What changed:    how much to take  how to take this  when to take this  additional instructions   metoprolol succinate 50 MG 24 hr tablet Commonly known as:  TOPROL-XL Take 1 tablet (50 mg total) by mouth 2 (two) times daily. Take with or immediately following a meal.   mirtazapine 7.5 MG tablet Commonly known as:  REMERON TAKE ONE TABLET AT BEDTIME.   pantoprazole 40 MG tablet Commonly known as:  PROTONIX TAKE 1 TABLET ONCE DAILY.   PRO-BIOTIC BLEND PO Take 1 tablet by mouth daily.   PROAIR HFA 108 (90 Base) MCG/ACT inhaler Generic drug:  albuterol USE 2 PUFFS EVERY 6 HOURS AS NEEDED FOR SHORTNESS OF BREATH AND WHEEZING. What changed:  See the new instructions.       Allergies  Allergen Reactions  . Aspirin Other (See Comments)    HX Bleeding Ulcer   . Nsaids Other (See Comments)    Hx of bleeding ulcers  . Tolmetin Rash    Hx of bleeding ulcers    Consultations:  Cardiology   Procedures/Studies: Dg Chest 1 View  Result Date: 08/10/2018 CLINICAL DATA:  Weakness and dizziness. Nausea. EXAM: CHEST  1 VIEW COMPARISON:  08/30/2015 FINDINGS: Mild cardiac enlargement. No vascular congestion, edema, or consolidation. No blunting of costophrenic angles. No pneumothorax. Mediastinal contours appear intact. IMPRESSION: Mild cardiac enlargement. No evidence of active pulmonary disease. Electronically Signed   By: Lucienne Capers M.D.   On: 08/10/2018 19:06   US Renal  Result Date: 08/10/2018 CLINICAL DATA:  Acute renal failure EXAM: RENAL / URINARY TRACT ULTRASOUND COMPLETE COMPARISON:  MRI 09/30/2016 FINDINGS: Right Kidney: Renal measurements: 9.3 x 4.0 x 4.4 cm = volume: 87 mL .  Echogenicity within normal limits. No mass or hydronephrosis visualized. Left Kidney: Renal measurements: 9.9 x 4.3 x 4.0 cm = volume: 91 mL. Echogenicity within normal limits. No mass or hydronephrosis visualized. Bladder: Appears normal for degree of bladder distention. IMPRESSION: Unremarkable renal ultrasound. Electronically Signed   By: Rolm Baptise M.D.   On: 08/10/2018 22:12    (Echo, Carotid, EGD, Colonoscopy, ERCP)    Subjective:   Discharge Exam: Vitals:   08/17/18 1050 08/17/18 1204  BP: 114/62 (!) 89/47  Pulse: (!) 48 (!) 58  Resp: 20 20  Temp:  (!) 97.3 F (36.3 C)  SpO2: 100% 100%     General: Pt is alert, awake, not in acute distress Cardiovascular: RRR, S1/S2 +, no rubs, no gallops Respiratory: CTA bilaterally, no wheezing, no rhonchi Abdominal: Soft, NT, ND, bowel sounds + Extremities: no edema, no cyanosis    The results of significant diagnostics from this hospitalization (including imaging, microbiology, ancillary and laboratory) are listed below for reference.     Microbiology: Recent Results (from the past 240 hour(s))  Gastrointestinal Panel by PCR , Stool     Status: None   Collection Time: 08/11/18  9:04 AM  Result Value Ref Range Status   Campylobacter species NOT DETECTED NOT DETECTED Final   Plesimonas shigelloides NOT DETECTED NOT DETECTED Final   Salmonella species NOT DETECTED NOT DETECTED Final   Yersinia enterocolitica NOT DETECTED NOT DETECTED Final   Vibrio species NOT DETECTED NOT DETECTED Final   Vibrio cholerae NOT DETECTED NOT DETECTED Final   Enteroaggregative E coli (EAEC) NOT DETECTED NOT DETECTED Final   Enteropathogenic E coli (EPEC) NOT DETECTED NOT DETECTED Final   Enterotoxigenic E coli (ETEC) NOT DETECTED NOT DETECTED Final   Shiga like toxin producing E coli (STEC) NOT DETECTED NOT DETECTED Final   Shigella/Enteroinvasive E coli (EIEC) NOT DETECTED NOT DETECTED Final   Cryptosporidium NOT DETECTED NOT DETECTED Final    Cyclospora cayetanensis NOT DETECTED NOT DETECTED Final   Entamoeba histolytica NOT DETECTED NOT DETECTED Final   Giardia lamblia NOT DETECTED NOT DETECTED Final   Adenovirus F40/41 NOT DETECTED NOT DETECTED Final   Astrovirus NOT DETECTED NOT DETECTED Final   Norovirus GI/GII NOT DETECTED NOT DETECTED Final   Rotavirus A NOT DETECTED NOT DETECTED Final   Sapovirus (I, II, IV, and V) NOT DETECTED NOT DETECTED Final    Comment: Performed at [facility], [address] [place], [place] [address]  C difficile quick scan w PCR reflex     Status: None   Collection Time: 08/11/18  3:28 PM  Result Value Ref Range Status   C Diff antigen NEGATIVE NEGATIVE Final   C Diff toxin NEGATIVE NEGATIVE Final   C Diff interpretation No C. difficile detected.  Final    Comment: Performed at [facility], [place] [address], [place], [place] [address]     Labs: BNP (last 3 results) Recent Labs    12/21/17 1824  BNP 793.9*   Basic Metabolic Panel: Recent Labs  Lab 08/10/18 1848  08/12/18 0928 08/13/18 0657 08/14/18 0709 08/15/18 0336 08/16/18 0353  NA 124*   < > 137 138 140 140 140  K 2.3*   < > 3.1* 4.2 4.1 3.7 3.6  CL 85*   < > 106 111 115* 114* 114*  CO2 11*   < > 22 17* 18* 18* 20*  GLUCOSE 93   < > 133* 107* 102* 105* 100*  BUN 104*   < > 58* 48* 33* 19 14  CREATININE 6.19*   < > 1.75* 1.38* 1.20* 1.13* 1.07*  CALCIUM 6.4*   < > 7.4* 8.2* 8.1* 8.2* 7.8*  MG 2.5*  --   --  2.1  --   --   --    < > = values in this interval not displayed.   Liver Function Tests: Recent Labs  Lab 08/10/18 1848  AST 12*  ALT 11  ALKPHOS 70  BILITOT 1.4*  PROT 6.8  ALBUMIN 3.7   No results for input(s): LIPASE, AMYLASE in the last 168 hours. No results for input(s): AMMONIA in the last 168 hours. CBC: Recent Labs  Lab 08/10/18 1848 08/10/18 1859 08/11/18 0530  WBC 6.3  --  4.7  NEUTROABS 4.2  --  2.4  HGB 13.2 12.2 10.9*  HCT 37.4 36.0 30.6*  MCV 90.8  --  91.9  PLT 269   --  198   Cardiac Enzymes: Recent Labs  Lab 08/10/18 1848 08/11/18 0152 08/11/18 0530 08/11/18 1227  TROPONINI 0.04* 0.03* 0.03* <0.03   BNP: Invalid input(s): POCBNP CBG: Recent Labs  Lab 08/13/18 0729 08/14/18 0717 08/15/18 0934  08/16/18 0737 08/17/18 0737  GLUCAP 110* 100* 110* 96 101*   D-Dimer No results for input(s): DDIMER in the last 72 hours. Hgb A1c No results for input(s): HGBA1C in the last 72 hours. Lipid Profile No results for input(s): CHOL, HDL, LDLCALC, TRIG, CHOLHDL, LDLDIRECT in the last 72 hours. Thyroid function studies No results for input(s): TSH, T4TOTAL, T3FREE, THYROIDAB in the last 72 hours.  Invalid input(s): FREET3 Anemia work up No results for input(s): VITAMINB12, FOLATE, FERRITIN, TIBC, IRON, RETICCTPCT in the last 72 hours. Urinalysis    Component Value Date/Time   COLORURINE YELLOW 08/10/2018 1955   APPEARANCEUR CLEAR 08/10/2018 1955   APPEARANCEUR Clear 12/21/2017 1824   LABSPEC 1.015 08/10/2018 [place] 5.0 08/10/2018 1955   GLUCOSEU NEGATIVE 08/10/2018 1955   HGBUR NEGATIVE 08/10/2018 1955   BILIRUBINUR NEGATIVE 08/10/2018 1955   BILIRUBINUR Negative 12/21/2017 [place] 08/10/2018 1955   PROTEINUR NEGATIVE 08/10/2018 1955   UROBILINOGEN 0.2 12/23/2016 1317   UROBILINOGEN 0.2 05/27/2011 1835   NITRITE NEGATIVE 08/10/2018 1955   LEUKOCYTESUR NEGATIVE 08/10/2018 1955   Sepsis Labs Invalid input(s): PROCALCITONIN,  WBC,  LACTICIDVEN Microbiology Recent Results (from the past 240 hour(s))  Gastrointestinal Panel by PCR , Stool     Status: None   Collection Time: 08/11/18  9:04 AM  Result Value Ref Range Status   Campylobacter species NOT DETECTED NOT DETECTED Final   Plesimonas shigelloides NOT DETECTED NOT DETECTED Final   Salmonella species NOT DETECTED NOT DETECTED Final   Yersinia enterocolitica NOT DETECTED NOT DETECTED Final   Vibrio species NOT DETECTED NOT DETECTED Final   Vibrio cholerae  NOT DETECTED NOT DETECTED Final   Enteroaggregative E coli (EAEC) NOT DETECTED NOT DETECTED Final   Enteropathogenic E coli (EPEC) NOT DETECTED NOT DETECTED Final   Enterotoxigenic E coli (ETEC) NOT DETECTED NOT DETECTED Final   Shiga like toxin producing E coli (STEC) NOT DETECTED NOT DETECTED Final   Shigella/Enteroinvasive E coli (EIEC) NOT DETECTED NOT DETECTED Final   Cryptosporidium NOT DETECTED NOT DETECTED Final   Cyclospora cayetanensis NOT DETECTED NOT DETECTED Final   Entamoeba histolytica NOT DETECTED NOT DETECTED Final   Giardia lamblia NOT DETECTED NOT DETECTED Final   Adenovirus F40/41 NOT DETECTED NOT DETECTED Final   Astrovirus NOT DETECTED NOT DETECTED Final   Norovirus GI/GII NOT DETECTED NOT DETECTED Final   Rotavirus A NOT DETECTED NOT DETECTED Final   Sapovirus (I, II, IV, and V) NOT DETECTED NOT DETECTED Final    Comment: Performed at [facility], [address] [place], [place] [address]  C difficile quick scan w PCR reflex     Status: None   Collection Time: 08/11/18  3:28 PM  Result Value Ref Range Status   C Diff antigen NEGATIVE NEGATIVE Final   C Diff toxin NEGATIVE NEGATIVE Final   C Diff interpretation No C. difficile detected.  Final    Comment: Performed at [facility], [place] [address], [place], [place] [address]     Time coordinating discharge: 40 minutes  SIGNED:   Charlynne Cousins, MD  [facility]

## 2018-08-17 NOTE — Progress Notes (Signed)
Upon morning assessment patient having expiratory wheezes and fine crackles bilaterally. Patient given 1 albuterol breathing treatment. O2 99% RA, HR 110, BP 127/89. Will continue to monitor patient.

## 2018-08-17 NOTE — Anesthesia Postprocedure Evaluation (Signed)
Anesthesia Post Note  Patient: Teresa [name]  Procedure(s) Performed: TRANSESOPHAGEAL ECHOCARDIOGRAM (TEE) (N/A ) CARDIOVERSION (N/A )     Patient location during evaluation: PACU Anesthesia Type: MAC Level of consciousness: awake and alert Pain management: pain level controlled Vital Signs Assessment: post-procedure vital signs reviewed and stable Respiratory status: spontaneous breathing, nonlabored ventilation and respiratory function stable Cardiovascular status: blood pressure returned to baseline and stable Postop Assessment: no apparent nausea or vomiting Anesthetic complications: no    Last Vitals:  Vitals:   08/17/18 0827 08/17/18 1016  BP: (!) 151/105 (!) 96/47  Pulse: (!) 111   Resp: (!) 27 (!) 21  Temp: 36.8 C 36.5 C  SpO2: 99% 100%    Last Pain:  Vitals:   08/17/18 1016  TempSrc: Oral  PainSc: 0-No pain                 Brennan Bailey

## 2018-08-18 NOTE — Telephone Encounter (Signed)
Patient contacted regarding discharge from [facility] on 08/17/18.  Patient understands to follow up with provider Jory Sims, DNP on 08/25/18 at 11 am  at [facility]. Patient understands discharge instructions? yes Patient understands medications and regiment? yes Patient understands to bring all medications to this visit? yes

## 2018-08-20 ENCOUNTER — Encounter (HOSPITAL_COMMUNITY): Payer: Self-pay | Admitting: Cardiology

## 2018-08-23 NOTE — Progress Notes (Deleted)
Cardiology Office Note   Date:  08/23/2018   ID:  Teresa [name], DOB 04-06-1941, MRN [id number]  PCP:  [name], MD  Cardiologist:  Dr Percival Spanish  No chief complaint on file.    History of Present Illness: Teresa [name] is a 78 y.o. female who presents for ongoing assessment and management of atrial fibrillation, chronic systolic CHF, NICM . She was recently admitted with uncontrolled atrial fib and decompensated CHF. She is here for TOC follow up after admission for frequent episodes of watery diarrhea.   She was seen on consult by Dr. Harl Bowie on 08/14/2018 for newly diagonosed atrial fib with RVR. She was started on Eliquis, taken off of carvedilol and changed to metoprolol. Medical management and optimization of medical therapy. No ischemic work up was planned. She was scheduled for TEE DCCV on 08/17/2018. This was successful but has a high likelihood of returning to Atrial fib with MR.   She was started on very low dose of lasix 20 mg daily.      Past Medical History:  Diagnosis Date  . Abdominal pain   . Anxiety    maintained on Xanax tid for years.  . Arthritis    FEET, KNEES, HANDS  . Chronic systolic CHF (congestive heart failure), NYHA class 2 ([place])    EF 25% 2011 MV,  50-55% echo 6/13  . Diabetes mellitus type 2, diet-controlled ([place])   . GERD (gastroesophageal reflux disease)   . Glaucoma    s/p surgery B in 30s.  [place].  . H/O hiatal hernia   . History of glaucoma   . History of small bowel obstruction 2009 AND NOV 2012  . HTN (hypertension)   . Hypothyroidism   . Left ovarian cyst   . Moderate mitral regurgitation   . Moderate tricuspid regurgitation   . Non-healing surgical wound    ABDOMINAL S/P EXPLORATOY LAPAROTOMY 04-18-2011  . Nonischemic cardiomyopathy ([place]) [facility]    EF is 25% per echo February 2012, non ischemic myoview 12/2009.  EF 50% echo 7/12 and plans for ICD cancelled.   . Peptic ulcer disease   . PVC (premature ventricular contraction)     . Shortness of breath   . Spinal stenosis   . Urge urinary incontinence   . Valvular heart disease    Moderate to severe MR, moderate TR, LAE per echo 7/11  . Wears dentures    upper denture-lower partial    Past Surgical History:  Procedure Laterality Date  . ABDOMINAL HERNIA REPAIR  32 YRS AGO   PERITINITIS  . APPENDECTOMY  1979   RUPTURED  . BENIGN RIGHT BREAST TUMOR REMOVED    . CARDIOVASCULAR STRESS TEST  JULY 2011-  DR HOCHREIN   NO ISCHEMIA  . CARDIOVERSION N/A 08/17/2018   Procedure: CARDIOVERSION;  Surgeon: Sueanne Margarita, MD;  Location: [facility] ENDOSCOPY;  Service: Cardiovascular;  Laterality: N/A;  . EXPLORATOMY LAP. / EXTENSIVE LYSIS ADHESIONS/ SMALL BOWEL RESECTION X2 WITH PRIMARY ANASTOMOSIS X2  04-18-2011   SMALL BOWEL OBSTRUCTION  . GLAUCOMA SURGERY  1978  . HERNIA REPAIR    . INCISION AND DRAINAGE OF WOUND N/A 09/13/2012   Procedure: INCISION  AND DEBRIDEMENT WOUND abdominal wall ;  Surgeon: Rolm Bookbinder, MD;  Location: [place];  Service: General;  Laterality: N/A;  coordination with Dr Migdalia Dk   . KNEE ARTHROSCOPY W/ MENISCECTOMY  06-05-2004  . TEE WITHOUT CARDIOVERSION N/A 08/17/2018   Procedure: TRANSESOPHAGEAL ECHOCARDIOGRAM (TEE);  Surgeon: Sueanne Margarita,  MD;  Location: [place];  Service: Cardiovascular;  Laterality: N/A;  . THYROIDECTOMY  1978 GOITOR   AND CERVICAL COLD KNIFE CONE BX  . TONSILLECTOMY  AGE 61  . TRANSTHORACIC ECHOCARDIOGRAM  12-27-2010   EF 45-50%/ MODERATE MV REGURG. / LEFT ATRIUM MILDLY DILATED/ MILD TRICUSPID REGURG.  . TUBAL LIGATION    . VENTRAL HERNIA REPAIR  X4  LAST ONE 20 YRS AGO   ABDOMINAL --  EACH ONE IN DIFFERENT AREA  . VENTRAL HERNIA REPAIR  01/29/2012   Procedure: HERNIA REPAIR VENTRAL ADULT;  Surgeon: Theodoro Kos, DO;  Location: [facility];  Service: Plastics;  Laterality: N/A;  . WOUND DEBRIDEMENT  09/25/2011   Procedure: DEBRIDEMENT CLOSURE/ABDOMINAL WOUND;  Surgeon: Theodoro Kos, DO;  Location: [place];  Service: Plastics;  Laterality: N/A;  excision of abdominal wound with primary closure     Current Outpatient Medications  Medication Sig Dispense Refill  . atorvastatin (LIPITOR) 10 MG tablet Take 1 tablet (10 mg total) by mouth daily. 90 tablet 3  . busPIRone (BUSPAR) 5 MG tablet Take 1 tablet (5 mg total) by mouth 2 (two) times daily. 180 tablet 1  . citalopram (CELEXA) 20 MG tablet TAKE 1 TABLET EACH DAY. (Patient taking differently: Take 20 mg by mouth daily. ) 90 tablet 1  . famotidine (PEPCID) 20 MG tablet Take 20 mg by mouth 2 (two) times daily.    . furosemide (LASIX) 20 MG tablet Take 1 tablet (20 mg total) by mouth daily as needed for fluid or edema. 90 tablet 0  . HYDROcodone-acetaminophen (NORCO/VICODIN) 5-325 MG tablet Take 1 tablet by mouth 5 (five) times daily.     Marland Kitchen levothyroxine (SYNTHROID, LEVOTHROID) 75 MCG tablet TAKE 1 TABLET ONCE DAILY BEFORE BREAKFAST. (Patient taking differently: Take 75 mcg by mouth daily before breakfast. ) 30 tablet 11  . metoprolol succinate (TOPROL-XL) 50 MG 24 hr tablet Take 1 tablet (50 mg total) by mouth 2 (two) times daily. Take with or immediately following a meal. 60 tablet 3  . mirtazapine (REMERON) 7.5 MG tablet TAKE ONE TABLET AT BEDTIME. (Patient taking differently: Take 7.5 mg by mouth at bedtime. ) 30 tablet 0  . pantoprazole (PROTONIX) 40 MG tablet TAKE 1 TABLET ONCE DAILY. (Patient taking differently: Take 40 mg by mouth daily. ) 30 tablet 5  . PROAIR HFA 108 (90 Base) MCG/ACT inhaler USE 2 PUFFS EVERY 6 HOURS AS NEEDED FOR SHORTNESS OF BREATH AND WHEEZING. (Patient taking differently: Inhale 2 puffs into the lungs every 6 (six) hours as needed for wheezing or shortness of breath. ) 8.5 g 0  . Probiotic Product (PRO-BIOTIC BLEND PO) Take 1 tablet by mouth daily.    Marland Kitchen Spacer/Aero-Holding Chambers (AEROCHAMBER MV) inhaler Use as instructed 1 each 0   No current facility-administered medications for this visit.      Allergies:   Aspirin; Nsaids; and Tolmetin    Social History:  The patient  reports that she quit smoking about 7 years ago. Her smoking use included cigarettes. She started smoking about 53 years ago. She has a 67.50 pack-year smoking history. She has never used smokeless tobacco. She reports that she does not drink alcohol or use drugs.   Family History:  The patient's family history includes Diabetes in her brother and daughter; Emphysema in her sister; Heart disease in her sister; Stroke in her brother, mother, and sister; Tuberculosis in her father.    ROS: All other systems are reviewed and  negative. Unless otherwise mentioned in H&P    PHYSICAL EXAM: VS:  There were no vitals taken for this visit. , BMI There is no height or weight on file to calculate BMI. GEN: Well nourished, well developed, in no acute distress HEENT: normal Neck: no JVD, carotid bruits, or masses Cardiac: ***RRR; no murmurs, rubs, or gallops,no edema  Respiratory:  Clear to auscultation bilaterally, normal work of breathing GI: soft, nontender, nondistended, + BS MS: no deformity or atrophy Skin: warm and dry, no rash Neuro:  Strength and sensation are intact Psych: euthymic mood, full affect   EKG:  EKG {ACTION; IS/IS [id number]} ordered today. The ekg ordered today demonstrates ***   Recent Labs: 12/21/2017: BNP 202.9 08/10/2018: ALT 11 08/11/2018: Hemoglobin 10.9; Platelets 198; TSH 0.812 08/13/2018: Magnesium 2.1 08/16/2018: BUN 14; Creatinine, Ser 1.07; Potassium 3.6; Sodium 140    Lipid Panel    Component Value Date/Time   CHOL 226 (H) 06/07/2018 1450   TRIG 213 (H) 06/07/2018 1450   HDL 33 (L) 06/07/2018 1450   CHOLHDL 6.8 (H) 06/07/2018 1450   CHOLHDL 6.4 (H) 02/26/2016 1133   VLDL 33 (H) 02/26/2016 1133   LDLCALC 150 (H) 06/07/2018 1450   LDLDIRECT 182.5 09/12/2010 0812      Wt Readings from Last 3 Encounters:  08/17/18 213 lb 11.2 oz (96.9 kg)  06/07/18 220 lb 6.4 oz (100 kg)   09/14/17 207 lb (93.9 kg)      Other studies Reviewed: Additional studies/ records that were reviewed today include: ***. Review of the above records demonstrates: ***   ASSESSMENT AND PLAN:  1.  ***   Current medicines are reviewed at length with the patient today.    Labs/ tests ordered today include: *** Phill Myron. [place], ANP, AACC   08/23/2018 1:14 PM    [facility] [address] Office [phone] Fax [phone]

## 2018-08-24 ENCOUNTER — Other Ambulatory Visit: Payer: Self-pay | Admitting: Family Medicine

## 2018-08-24 NOTE — Telephone Encounter (Signed)
Requested Prescriptions  Pending Prescriptions Disp Refills  . mirtazapine (REMERON) 7.5 MG tablet [Pharmacy Med Name: MIRTAZAPINE 7.5 MG TABLET] 90 tablet 0    Sig: TAKE ONE TABLET AT BEDTIME.     Psychiatry: Antidepressants - mirtazapine Failed - 08/24/2018 10:22 AM      Failed - AST in normal range and within 360 days    AST  Date Value Ref Range Status  08/10/2018 12 (L) 15 - 41 U/L Final         Failed - Triglycerides in normal range and within 360 days    Triglycerides  Date Value Ref Range Status  06/07/2018 213 (H) 0 - 149 mg/dL Final         Failed - Total Cholesterol in normal range and within 360 days    Cholesterol, Total  Date Value Ref Range Status  06/07/2018 226 (H) 100 - 199 mg/dL Final         Passed - ALT in normal range and within 360 days    ALT  Date Value Ref Range Status  08/10/2018 11 0 - 44 U/L Final         Passed - WBC in normal range and within 360 days    WBC  Date Value Ref Range Status  08/11/2018 4.7 4.0 - 10.5 K/uL Final         Passed - Completed PHQ-2 or PHQ-9 in the last 360 days.      Passed - Valid encounter within last 6 months    Recent Outpatient Visits          2 weeks ago Hypotension, unspecified hypotension type   Primary Care at [facility], Fenton Malling, MD   2 months ago Hypothyroidism due to acquired atrophy of thyroid   Primary [facility] at [facility], Lilia Argue, MD   8 months ago Anxiety and depression   Primary Care at [facility], Renette Butters, MD   11 months ago Essential (primary) hypertension   Primary Care at [facility], Renette Butters, MD   1 year ago Essential (primary) hypertension   Primary Care at [facility], Renette Butters, MD      Future Appointments            In 3 months [name], MD Primary Care at [place]

## 2018-08-25 ENCOUNTER — Ambulatory Visit: Payer: Medicare Other | Admitting: Adult Health

## 2018-09-09 ENCOUNTER — Telehealth: Payer: Self-pay

## 2018-09-10 ENCOUNTER — Telehealth: Payer: Self-pay

## 2018-09-10 NOTE — Telephone Encounter (Signed)
Tried to call pt back she states that she is taking a nap and will call back later

## 2018-09-10 NOTE — Telephone Encounter (Signed)
CONTINUED FROM LAST ENTRY......PT STATES THAT SHE ONLY HAS A FLIP PHONE BUT SHE IS FINE WITH DOING A TELEPHONE VISIT. SHE WOULD LIKE TO BE CALLED AT A LATER TIME SHE WAS Teresa [name]

## 2018-09-10 NOTE — Telephone Encounter (Signed)
Duplicate- see other message

## 2018-09-10 NOTE — Telephone Encounter (Signed)
Called pt this morning she states that she is fine with a tele-visit and she states that she has no BP cuff or scale to get her weight. She states that she can go over to her son's that morning and get her BP and wt before her visit.She asked me to Cb later with anymore questions as she is still in bed. Message sent to [facility]

## 2018-09-11 NOTE — Progress Notes (Signed)
Patient cancelled appt  Signed, Jory Sims, NP  09/11/2018 5:47 PM    [address], [place], [place]  [address] Phone: [phone]; Fax: [phone]

## 2018-09-13 ENCOUNTER — Ambulatory Visit: Payer: Medicare Other | Admitting: Adult Health

## 2018-09-13 ENCOUNTER — Telehealth (INDEPENDENT_AMBULATORY_CARE_PROVIDER_SITE_OTHER): Payer: Medicare Other | Admitting: Adult Health

## 2018-09-13 NOTE — Telephone Encounter (Signed)
Follow up   Patient states that she is not going to be able to do a virtual visit today. I attempted to cancel this appointment but it is marked as arrived. Please cancel.

## 2018-09-13 NOTE — Telephone Encounter (Signed)
RESCHEDULED APPT PT HAS STOMACH BUG AND WOULD LIKE TO BE SEEN Thursday SHE WILL CB IF SHE NEEDS TO BE RESCHEDULED

## 2018-09-15 ENCOUNTER — Other Ambulatory Visit: Payer: Self-pay

## 2018-09-15 DIAGNOSIS — I4891 Unspecified atrial fibrillation: Secondary | ICD-10-CM

## 2018-09-15 HISTORY — DX: Unspecified atrial fibrillation: I48.91

## 2018-09-15 NOTE — Progress Notes (Signed)
Virtual Visit via Telephone Note     Evaluation Performed:  Follow-up visit  This visit type was conducted due to national recommendations for restrictions regarding the COVID-19 Pandemic (e.g. social distancing).  This format is felt to be most appropriate for this patient at this time.  All issues noted in this document were discussed and addressed.  No physical exam was performed (except for noted visual exam findings with Telehealth visits).  See MyChart message from today for the patient's consent to telehealth for [facility].  Date:  09/16/2018   ID:  Teresa [name], DOB Sep 12, 1940, MRN [id number]  Patient Location:  [address] [place] [address]   Provider location:   [place], [place] office   PCP:  [name], MD  Cardiologist:  Minus Breeding, MD  Electrophysiologist:  None   Chief Complaint:  Atrial fib and BP  History of Present Illness:    Teresa [name] is a 78 y.o. female who presents via audio/video conferencing for a telehealth visit today.  She has a history of acute systolic CHF, exacerbated by acute GI illness, atrial fib, on Eliquis and Toprol, s/p DCCV with conversion to NSR. She has a high likelihood of returning to atrial fib in the setting of moderate mitral regurg.   She has other history of depression and anxiety, hypothyroidism, and Type II diabetes. With chronic pain in her shoulders.   She was discharged on 08/17/2018, diagnosed with acute GI illness but negative for C-Diff. She was discharged on low dose lasix, 20 mg daily. She was taken off of spironolactone, losartan and carvedilol. She was started on metoprolol succinate 50 mg BID.   She states that she has no way of checking her BP at home. Her son cares for her but she has not been getting out to have her readings done. She is medically compliant as her son provides all of her medications in a pill dispenser.   She has not felt her HR being rapid or irregular. She is sensitive to  this and has not experienced this now. She has been feeling less fatigued . No bleeding or excessive bruising. No DOE. She is staying home as directed.   The patient does not symptoms concerning for COVID-19 infection (fever, chills, cough, or new SHORTNESS OF BREATH).    Prior CV studies:   The following studies were reviewed today:  ECHO: 08/17/2018  1. The left ventricle has moderately reduced systolic function, with an ejection fraction of 35-40%. The cavity size was normal. There is mildly increased left ventricular wall thickness. Left ventricular diastology could not be evaluated secondary to  atrial fibrillation. Left ventricular diffuse hypokinesis. 2. The right ventricle has normal systolic function. The cavity was normal. There is no increase in right ventricular wall thickness. 3. Left atrial size was moderately dilated. 4. Right atrial size was mildly dilated. 5. The mitral valve is degenerative. Mild thickening of the mitral valve leaflet. 6. The aortic valve was not well visualized Mild calcification of the aortic valve. Aortic valve regurgitation is mild by color flow Doppler. The jet is posteriorly-directed no stenosis of the aortic valve. 7. The aortic root and ascending aorta are normal in size and structure.   Procedure: Transesophageal echocardiogram 08/17/2018 Operator: Fransico Him, MD Normal LV size andmildly reduced LVfunctionwith EF 40-45%. Normal RV size and function Normal RA Moderately dilatedLA and normal LA appendage with no evidence of thrombus and normal emptying velocity Normal TVmild to moderate TR Normal PVwith trivial  PR Normal MVwith 3+ MR that wraps around the LA and to the ostium of the pulmonary vein but no flow reversal into the pulmonary vein.  Mildly sclerotictrileaflet AV Focal lipomatous hypertrophy of theinteratrial septum with no evidence of shunt by colorflow dopper Mild atherosclerosis of thethoracic and ascending  aorta.  The patient tolerated the procedure well andwent on to DCCV.   Past Medical History:  Diagnosis Date   Abdominal pain    Anxiety    maintained on Xanax tid for years.   Arthritis    FEET, KNEES, HANDS   Chronic systolic CHF (congestive heart failure), NYHA class 2 (HCC)    EF 25% 2011 MV,  50-55% echo 6/13   Diabetes mellitus type 2, diet-controlled (HCC)    GERD (gastroesophageal reflux disease)    Glaucoma    s/p surgery B in 30s.  [place].   H/O hiatal hernia    History of glaucoma    History of small bowel obstruction 2009 AND NOV 2012   HTN (hypertension)    Hypothyroidism    Left ovarian cyst    Moderate mitral regurgitation    Moderate tricuspid regurgitation    Non-healing surgical wound    ABDOMINAL S/P EXPLORATOY LAPAROTOMY 04-18-2011   Nonischemic cardiomyopathy ([place]) DR [facility]    EF is 25% per echo February 2012, non ischemic myoview 12/2009.  EF 50% echo 7/12 and plans for ICD cancelled.    Peptic ulcer disease    PVC (premature ventricular contraction)    Shortness of breath    Spinal stenosis    Urge urinary incontinence    Valvular heart disease    Moderate to severe MR, moderate TR, LAE per echo 7/11   Wears dentures    upper denture-lower partial   Past Surgical History:  Procedure Laterality Date   ABDOMINAL HERNIA REPAIR  32 YRS AGO   [place]   RUPTURED   BENIGN RIGHT BREAST TUMOR REMOVED     CARDIOVASCULAR STRESS TEST  JULY 2011-  DR HOCHREIN   NO ISCHEMIA   CARDIOVERSION N/A 08/17/2018   Procedure: CARDIOVERSION;  Surgeon: Sueanne Margarita, MD;  Location: [place] END[facility]COPY;  Service: Cardiovascular;  Laterality: N/A;   EXPLORATOMY LAP. / EXTENSIVE LYSIS ADHESIONS/ SMALL BOWEL RESECTION X2 WITH PRIMARY ANASTOMOSIS X2  04-18-2011   SMALL BOWEL OBSTRUCTION   GLAUCOMA SURGERY  1978   HERNIA REPAIR     INCISION AND DRAINAGE OF WOUND N/A 09/13/2012   Procedure: INCISION  AND DEBRIDEMENT  WOUND abdominal wall ;  Surgeon: Rolm Bookbinder, MD;  Location: [place];  Service: General;  Laterality: N/A;  coordination with Dr Migdalia Dk    KNEE ARTHROSCOPY W/ MENISCECTOMY  06-05-2004   TEE WITHOUT CARDIOVERSION N/A 08/17/2018   Procedure: TRANSESOPHAGEAL ECHOCARDIOGRAM (TEE);  Surgeon: Sueanne Margarita, MD;  Location[facility] END[facility]COPY;  Service: Cardiovascular;  Laterality: N/A;   [place]  AGE 33   TRANSTHORACIC ECHOCARDIOGRAM  12-27-2010   EF 45-50%/ MODERATE MV REGURG. / LEFT ATRIUM MILDLY DILATED/ MILD TRICUSPID [place].   TUBAL LIGATION     VENTRAL HERNIA REPAIR  X4  LAST ONE 20 YRS AGO   ABDOMINAL --  EACH ONE IN DIFFERENT AREA   VENTRAL HERNIA REPAIR  01/29/2012   Procedure: HERNIA REPAIR VENTRAL ADULT;  Surgeon: Theodoro Kos, DO;  Location: [place];  Service: Plastics;  Laterality: N/A;   WOUND DEBRIDEMENT  09/25/2011   Procedure: DEBRIDEMENT CLOSURE/ABDOMINAL WOUND;  Surgeon: Theodoro Kos, DO;  Location: [facility];  Service: Plastics;  Laterality: N/A;  excision of abdominal wound with primary closure     Current Meds  Medication Sig   atorvastatin (LIPITOR) 10 MG tablet Take 1 tablet (10 mg total) by mouth daily.   busPIRone (BUSPAR) 5 MG tablet Take 1 tablet (5 mg total) by mouth 2 (two) times daily.   citalopram (CELEXA) 20 MG tablet TAKE 1 TABLET EACH DAY. (Patient taking differently: Take 20 mg by mouth daily. )   furosemide (LASIX) 20 MG tablet Take 1 tablet (20 mg total) by mouth daily as needed for fluid or edema.   HYDROcodone-acetaminophen (NORCO/VICODIN) 5-325 MG tablet Take 1 tablet by mouth 5 (five) times daily.    levothyroxine (SYNTHROID, LEVOTHROID) 75 MCG tablet TAKE 1 TABLET ONCE DAILY BEFORE BREAKFAST. (Patient taking differently: Take 75 mcg by mouth daily before breakfast. )   metoprolol succinate (TOPROL-XL) 50 MG 24 hr tablet Take 1 tablet (50  mg total) by mouth 2 (two) times daily. Take with or immediately following a meal.   mirtazapine (REMERON) 7.5 MG tablet TAKE ONE TABLET AT BEDTIME.   PROAIR HFA 108 (90 Base) MCG/ACT inhaler USE 2 PUFFS EVERY 6 HOURS AS NEEDED FOR SHORTNESS OF BREATH AND WHEEZING.   Spacer/Aero-Holding Chambers (AEROCHAMBER MV) inhaler Use as instructed     Allergies:   Aspirin; Nsaids; and Tolmetin   Social History   Tobacco Use   Smoking status: Former Smoker    Packs/day: 1.50    Years: 45.00    Pack years: 67.50    Types: Cigarettes    Start date: 11/14/1964    Last attempt to quit: 09/22/2010    Years since quitting: 7.9   Smokeless tobacco: Never Used   Tobacco comment: quit 2011 or 2012 - Didn't smoke during pregnancies  Substance Use Topics   Alcohol use: No   Drug use: No     Family Hx: The patient's family history includes Diabetes in her brother and daughter; Emphysema in her sister; Heart disease in her sister; Stroke in her brother, mother, and sister; Tuberculosis in her father.  ROS:   Please see the history of present illness.    All other systems reviewed and are negative.   Labs/Other Tests and Data Reviewed:    Recent Labs: 12/21/2017: BNP 202.9 08/10/2018: ALT 11 08/11/2018: Hemoglobin 10.9; Platelets 198; TSH 0.812 08/13/2018: Magnesium 2.1 08/16/2018: BUN 14; Creatinine, Ser 1.07; Potassium 3.6; Sodium 140   Recent Lipid Panel Lab Results  Component Value Date/Time   CHOL 226 (H) 06/07/2018 02:50 PM   TRIG 213 (H) 06/07/2018 02:50 PM   HDL 33 (L) 06/07/2018 02:50 PM   CHOLHDL 6.8 (H) 06/07/2018 02:50 PM   CHOLHDL 6.4 (H) 02/26/2016 11:33 AM   LDLCALC 150 (H) 06/07/2018 02:50 PM   LDLDIRECT 182.5 09/12/2010 08:12 AM    Wt Readings from Last 3 Encounters:  08/17/18 213 lb 11.2 oz (96.9 kg)  06/07/18 220 lb 6.4 oz (100 kg)  09/14/17 207 lb (93.9 kg)     Exam:    Vital Signs:  There were no vitals taken for this visit. (Patent has no ability to take vital  signs.   Well nourished, well developed female in no acute distress. Assessment is severely limited due to telephone visit.   ASSESSMENT & PLAN:    1. Atrial fibrillation: As this is telephone visit, unable to document her rhythm.  She states that she can feel when her HR is irregular and does not feel this now. She is asymptomatic, no bleeding with apixaban.   I will have her next appointment be in person, to check her BP and do EKG whenit is safe for her to leave her home and come to the office. No changes in her regimen.   2. Hypertension: Unable to document BP response to medication changes as she does not have BP cuff at home. I recommended that her son get a BP cuff and take it for her at least once a day and record. She is to call us for symptoms of dizziness, near syncope or elevated BP, such as headache or chest pressure.     COVID-19 Education: The signs and symptoms of COVID-19 were discussed with the patient and how to seek care for testing (follow up with PCP or arrange E-visit).  The importance of social distancing was discussed today.  Patient Risk:   After full review of this patients clinical status, I feel that they are at least moderate risk at this time.  Time:   Today, I have spent 10  minutes with the patient with telehealth technology discussing above diagnosis.    Medication Adjustments/Labs and Tests Ordered: Current medicines are reviewed at length with the patient today.  Concerns regarding medicines are outlined above.  Tests Ordered: No orders of the defined types were placed in this encounter.  Medication Changes: No orders of the defined types were placed in this encounter.   Disposition:  3 months.  Signed, Phill Myron. West Pugh, ANP, [facility]  09/16/2018 11:28 AM    [facility] [place], [place],   [address] Phone: [phone]; Fax: [phone]

## 2018-09-16 ENCOUNTER — Telehealth (INDEPENDENT_AMBULATORY_CARE_PROVIDER_SITE_OTHER): Payer: Medicare Other | Admitting: Adult Health

## 2018-09-16 DIAGNOSIS — I10 Essential (primary) hypertension: Secondary | ICD-10-CM | POA: Diagnosis not present

## 2018-09-16 DIAGNOSIS — I480 Paroxysmal atrial fibrillation: Secondary | ICD-10-CM | POA: Diagnosis not present

## 2018-09-16 MED ORDER — FUROSEMIDE 20 MG PO TABS: 20.0000 mg | ORAL_TABLET | Freq: Every day | ORAL | 1 refills | Status: DC | PRN

## 2018-09-16 MED ORDER — ATORVASTATIN CALCIUM 10 MG PO TABS: 10.0000 mg | ORAL_TABLET | Freq: Every day | ORAL | 1 refills | Status: DC

## 2018-09-16 MED ORDER — METOPROLOL SUCCINATE ER 50 MG PO TB24: 50.0000 mg | ORAL_TABLET | Freq: Two times a day (BID) | ORAL | 1 refills | Status: DC

## 2018-09-16 NOTE — Patient Instructions (Addendum)
Follow-Up: You will need a follow up appointment in person in  3-4 months.  You may see Minus Breeding, MD Jory Sims, DNP, [place]  or one of the following Advanced Practice Providers on your designated Care Team:  Calton Golds, PA-C  Jory Sims, DNP, ANP       Get a blood pressure machine and take your blood pressure daily one hour after taking you blood pressure medication.  Medication Instructions:  NO CHANGES- Your physician recommends that you continue on your current medications as directed. Please refer to the Current Medication list given to you today. If you need a refill on your cardiac medications before your next appointment, please call your pharmacy. Labwork: When you have labs (blood work) and your tests are completely normal, you will receive your results ONLY by [place] (if you have MyChart) -OR- A paper copy in the mail.  At [facility], you and your health needs are our priority.  As part of our continuing mission to provide you with exceptional heart care, we have created designated Provider Care Teams.  These Care Teams include your primary Cardiologist (physician) and Advanced Practice Providers (APPs -  Physician Assistants and Nurse Practitioners) who all work together to provide you with the care you need, when you need it.  Thank you for choosing [facility] at [facility]!!

## 2018-09-16 NOTE — Addendum Note (Signed)
Addended by: Waylan Rocher on: 09/16/2018 11:56 AM   Modules accepted: Orders

## 2018-10-04 ENCOUNTER — Ambulatory Visit: Payer: Self-pay

## 2018-10-04 NOTE — Telephone Encounter (Signed)
Returned call to patient who states that she woke in the middle of the night with diarrhea.  She states that her bowels were very loose. This morning she has had another loose stool. She states that she just feels really bad.  She denies fever.  She denies abdominal pain. She states that she just feels real nauseous and does not want to eat.  She states she has been sipping water. She denies weakness and states she is voiding per her normal. Her urine is not dark.  She had company this weekend. Nobody was sick. Per protocol call was placed to office for appointment. Care advice read to patient.  Patient verbalized understanding of instructions.  Reason for Disposition . [1] MILD diarrhea (e.g., 1-3 or more stools than normal in past 24 hours) without known cause AND [2] present >  7 days  Answer Assessment - Initial Assessment Questions 1. DIARRHEA SEVERITY: "How bad is the diarrhea?" "How many extra stools have you had in the past 24 hours than normal?"    - NO DIARRHEA (SCALE 0)   - MILD (SCALE 1-3): Few loose or mushy BMs; increase of 1-3 stools over normal daily number of stools; mild increase in ostomy output.   -  MODERATE (SCALE 4-7): Increase of 4-6 stools daily over normal; moderate increase in ostomy output. * SEVERE (SCALE 8-10; OR 'WORST POSSIBLE'): Increase of 7 or more stools daily over normal; moderate increase in ostomy output; incontinence.    2 today 2. ONSET: "When did the diarrhea begin?"      Middle of night 3. BM CONSISTENCY: "How loose or watery is the diarrhea?"      Very loose 4. VOMITING: "Are you also vomiting?" If so, ask: "How many times in the past 24 hours?"     No Nausea feeling 5. ABDOMINAL PAIN: "Are you having any abdominal pain?" If yes: "What does it feel like?" (e.g., crampy, dull, intermittent, constant)     No 6. ABDOMINAL PAIN SEVERITY: If present, ask: "How bad is the pain?"  (e.g., Scale 1-10; mild, moderate, or severe)   - MILD (1-3): doesn't interfere  with normal activities, abdomen soft and not tender to touch    - MODERATE (4-7): interferes with normal activities or awakens from sleep, tender to touch    - SEVERE (8-10): excruciating pain, doubled over, unable to do any normal activities       No more nauea 7. ORAL INTAKE: If vomiting, "Have you been able to drink liquids?" "How much fluids have you had in the past 24 hours?"    water 8. HYDRATION: "Any signs of dehydration?" (e.g., dry mouth [not just dry lips], too weak to stand, dizziness, new weight loss) "When did you last urinate?"    normal 9. EXPOSURE: "Have you traveled to a foreign country recently?" "Have you been exposed to anyone with diarrhea?" "Could you have eaten any food that was spoiled?"     No  10. ANTIBIOTIC USE: "Are you taking antibiotics now or have you taken antibiotics in the past 2 months?"      no 11. OTHER SYMPTOMS: "Do you have any other symptoms?" (e.g., fever, blood in stool)     No fever feels yucky  12. PREGNANCY: "Is there any chance you are pregnant?" "When was your last menstrual period?"       N/A  Protocols used: DIARRHEA-A-AH

## 2018-10-05 ENCOUNTER — Telehealth: Payer: Medicare Other | Admitting: Registered Nurse

## 2018-10-05 ENCOUNTER — Other Ambulatory Visit: Payer: Self-pay

## 2018-10-05 DIAGNOSIS — R7302 Impaired glucose tolerance (oral): Secondary | ICD-10-CM

## 2018-10-05 DIAGNOSIS — K863 Pseudocyst of pancreas: Secondary | ICD-10-CM

## 2018-10-05 DIAGNOSIS — K591 Functional diarrhea: Secondary | ICD-10-CM

## 2018-10-05 DIAGNOSIS — E034 Atrophy of thyroid (acquired): Secondary | ICD-10-CM

## 2018-10-05 MED ORDER — ALBUTEROL SULFATE HFA 108 (90 BASE) MCG/ACT IN AERS: INHALATION_SPRAY | RESPIRATORY_TRACT | 0 refills | Status: DC

## 2018-10-13 ENCOUNTER — Inpatient Hospital Stay (HOSPITAL_COMMUNITY)
Admission: EM | Admit: 2018-10-13 | Discharge: 2018-10-20 | DRG: 291 | Disposition: A | Discharge status: Home Health | Payer: Medicare Other | Attending: Internal Medicine | Admitting: Internal Medicine

## 2018-10-13 ENCOUNTER — Encounter (HOSPITAL_COMMUNITY): Payer: Self-pay

## 2018-10-13 ENCOUNTER — Emergency Department (HOSPITAL_COMMUNITY): Payer: Medicare Other

## 2018-10-13 ENCOUNTER — Other Ambulatory Visit: Payer: Self-pay

## 2018-10-13 DIAGNOSIS — Z886 Allergy status to analgesic agent status: Secondary | ICD-10-CM | POA: Diagnosis not present

## 2018-10-13 DIAGNOSIS — Z8711 Personal history of peptic ulcer disease: Secondary | ICD-10-CM | POA: Diagnosis not present

## 2018-10-13 DIAGNOSIS — D696 Thrombocytopenia, unspecified: Secondary | ICD-10-CM

## 2018-10-13 DIAGNOSIS — R0989 Other specified symptoms and signs involving the circulatory and respiratory systems: Secondary | ICD-10-CM | POA: Diagnosis not present

## 2018-10-13 DIAGNOSIS — K219 Gastro-esophageal reflux disease without esophagitis: Secondary | ICD-10-CM | POA: Diagnosis present

## 2018-10-13 DIAGNOSIS — Z831 Family history of other infectious and parasitic diseases: Secondary | ICD-10-CM | POA: Diagnosis not present

## 2018-10-13 DIAGNOSIS — I5043 Acute on chronic combined systolic (congestive) and diastolic (congestive) heart failure: Secondary | ICD-10-CM | POA: Diagnosis present

## 2018-10-13 DIAGNOSIS — K449 Diaphragmatic hernia without obstruction or gangrene: Secondary | ICD-10-CM | POA: Diagnosis present

## 2018-10-13 DIAGNOSIS — Z1159 Encounter for screening for other viral diseases: Secondary | ICD-10-CM

## 2018-10-13 DIAGNOSIS — M4800 Spinal stenosis, site unspecified: Secondary | ICD-10-CM | POA: Diagnosis present

## 2018-10-13 DIAGNOSIS — Z79899 Other long term (current) drug therapy: Secondary | ICD-10-CM | POA: Diagnosis not present

## 2018-10-13 DIAGNOSIS — Z6837 Body mass index (BMI) 37.0-37.9, adult: Secondary | ICD-10-CM

## 2018-10-13 DIAGNOSIS — I083 Combined rheumatic disorders of mitral, aortic and tricuspid valves: Secondary | ICD-10-CM | POA: Diagnosis present

## 2018-10-13 DIAGNOSIS — N183 Chronic kidney disease, stage 3 (moderate): Secondary | ICD-10-CM | POA: Diagnosis present

## 2018-10-13 DIAGNOSIS — H409 Unspecified glaucoma: Secondary | ICD-10-CM | POA: Diagnosis present

## 2018-10-13 DIAGNOSIS — J90 Pleural effusion, not elsewhere classified: Secondary | ICD-10-CM | POA: Diagnosis not present

## 2018-10-13 DIAGNOSIS — Z87891 Personal history of nicotine dependence: Secondary | ICD-10-CM

## 2018-10-13 DIAGNOSIS — Z833 Family history of diabetes mellitus: Secondary | ICD-10-CM

## 2018-10-13 DIAGNOSIS — F419 Anxiety disorder, unspecified: Secondary | ICD-10-CM | POA: Diagnosis not present

## 2018-10-13 DIAGNOSIS — E039 Hypothyroidism, unspecified: Secondary | ICD-10-CM | POA: Diagnosis not present

## 2018-10-13 DIAGNOSIS — I130 Hypertensive heart and chronic kidney disease with heart failure and stage 1 through stage 4 chronic kidney disease, or unspecified chronic kidney disease: Principal | ICD-10-CM | POA: Diagnosis present

## 2018-10-13 DIAGNOSIS — I5021 Acute systolic (congestive) heart failure: Secondary | ICD-10-CM | POA: Diagnosis not present

## 2018-10-13 DIAGNOSIS — I340 Nonrheumatic mitral (valve) insufficiency: Secondary | ICD-10-CM | POA: Diagnosis not present

## 2018-10-13 DIAGNOSIS — E1122 Type 2 diabetes mellitus with diabetic chronic kidney disease: Secondary | ICD-10-CM | POA: Diagnosis present

## 2018-10-13 DIAGNOSIS — Z823 Family history of stroke: Secondary | ICD-10-CM

## 2018-10-13 DIAGNOSIS — E785 Hyperlipidemia, unspecified: Secondary | ICD-10-CM | POA: Diagnosis present

## 2018-10-13 DIAGNOSIS — Z515 Encounter for palliative care: Secondary | ICD-10-CM | POA: Diagnosis not present

## 2018-10-13 DIAGNOSIS — M353 Polymyalgia rheumatica: Secondary | ICD-10-CM | POA: Diagnosis present

## 2018-10-13 DIAGNOSIS — Z79890 Hormone replacement therapy (postmenopausal): Secondary | ICD-10-CM

## 2018-10-13 DIAGNOSIS — I428 Other cardiomyopathies: Secondary | ICD-10-CM | POA: Diagnosis present

## 2018-10-13 DIAGNOSIS — I493 Ventricular premature depolarization: Secondary | ICD-10-CM | POA: Diagnosis present

## 2018-10-13 DIAGNOSIS — Z825 Family history of asthma and other chronic lower respiratory diseases: Secondary | ICD-10-CM

## 2018-10-13 DIAGNOSIS — Z888 Allergy status to other drugs, medicaments and biological substances status: Secondary | ICD-10-CM | POA: Diagnosis not present

## 2018-10-13 DIAGNOSIS — F329 Major depressive disorder, single episode, unspecified: Secondary | ICD-10-CM | POA: Diagnosis not present

## 2018-10-13 DIAGNOSIS — F411 Generalized anxiety disorder: Secondary | ICD-10-CM

## 2018-10-13 DIAGNOSIS — Z8249 Family history of ischemic heart disease and other diseases of the circulatory system: Secondary | ICD-10-CM

## 2018-10-13 DIAGNOSIS — F418 Other specified anxiety disorders: Secondary | ICD-10-CM | POA: Diagnosis present

## 2018-10-13 DIAGNOSIS — R011 Cardiac murmur, unspecified: Secondary | ICD-10-CM | POA: Diagnosis not present

## 2018-10-13 DIAGNOSIS — D7589 Other specified diseases of blood and blood-forming organs: Secondary | ICD-10-CM | POA: Diagnosis not present

## 2018-10-13 DIAGNOSIS — N179 Acute kidney failure, unspecified: Secondary | ICD-10-CM

## 2018-10-13 DIAGNOSIS — N182 Chronic kidney disease, stage 2 (mild): Secondary | ICD-10-CM | POA: Diagnosis not present

## 2018-10-13 DIAGNOSIS — Z66 Do not resuscitate: Secondary | ICD-10-CM | POA: Diagnosis not present

## 2018-10-13 DIAGNOSIS — I4819 Other persistent atrial fibrillation: Secondary | ICD-10-CM | POA: Diagnosis present

## 2018-10-13 DIAGNOSIS — I4891 Unspecified atrial fibrillation: Secondary | ICD-10-CM | POA: Diagnosis not present

## 2018-10-13 DIAGNOSIS — R740 Nonspecific elevation of levels of transaminase and lactic acid dehydrogenase [LDH]: Secondary | ICD-10-CM | POA: Diagnosis present

## 2018-10-13 DIAGNOSIS — R000 Tachycardia, unspecified: Secondary | ICD-10-CM | POA: Diagnosis not present

## 2018-10-13 DIAGNOSIS — I509 Heart failure, unspecified: Secondary | ICD-10-CM

## 2018-10-13 DIAGNOSIS — M159 Polyosteoarthritis, unspecified: Secondary | ICD-10-CM | POA: Diagnosis present

## 2018-10-13 DIAGNOSIS — I5023 Acute on chronic systolic (congestive) heart failure: Secondary | ICD-10-CM | POA: Diagnosis not present

## 2018-10-13 DIAGNOSIS — R0602 Shortness of breath: Secondary | ICD-10-CM | POA: Diagnosis present

## 2018-10-13 DIAGNOSIS — R748 Abnormal levels of other serum enzymes: Secondary | ICD-10-CM | POA: Diagnosis present

## 2018-10-13 HISTORY — DX: Unspecified atrial fibrillation: I48.91

## 2018-10-13 LAB — BASIC METABOLIC PANEL
Anion gap: 18 — ABNORMAL HIGH (ref 5–15)
BUN: 26 mg/dL — ABNORMAL HIGH (ref 8–23)
CO2: 16 mmol/L — ABNORMAL LOW (ref 22–32)
Calcium: 8.6 mg/dL — ABNORMAL LOW (ref 8.9–10.3)
Chloride: 105 mmol/L (ref 98–111)
Creatinine, Ser: 1.57 mg/dL — ABNORMAL HIGH (ref 0.44–1.00)
GFR calc Af Amer: 36 mL/min — ABNORMAL LOW (ref >60)
GFR calc non Af Amer: 31 mL/min — ABNORMAL LOW (ref >60)
Glucose, Bld: 132 mg/dL — ABNORMAL HIGH (ref 70–99)
Potassium: 3.7 mmol/L (ref 3.5–5.1)
Sodium: 139 mmol/L (ref 135–145)

## 2018-10-13 LAB — CBC
HCT: 39.7 % (ref 36.0–46.0)
HCT: 37.6 % (ref 36.0–46.0)
Hemoglobin: 12.5 g/dL (ref 12.0–15.0)
Hemoglobin: 12.4 g/dL (ref 12.0–15.0)
MCH: 32.1 pg (ref 26.0–34.0)
MCH: 33.2 pg (ref 26.0–34.0)
MCHC: 31.5 g/dL (ref 30.0–36.0)
MCHC: 33 g/dL (ref 30.0–36.0)
MCV: 102.1 fL — ABNORMAL HIGH (ref 80.0–100.0)
MCV: 100.8 fL — ABNORMAL HIGH (ref 80.0–100.0)
Platelets: 122 10*3/uL — ABNORMAL LOW (ref 150–400)
Platelets: 123 10*3/uL — ABNORMAL LOW (ref 150–400)
RBC: 3.89 MIL/uL (ref 3.87–5.11)
RBC: 3.73 MIL/uL — ABNORMAL LOW (ref 3.87–5.11)
RDW: 15.1 % (ref 11.5–15.5)
RDW: 15.2 % (ref 11.5–15.5)
WBC: 5.8 10*3/uL (ref 4.0–10.5)
WBC: 7.7 10*3/uL (ref 4.0–10.5)
nRBC: 0 % (ref 0.0–0.2)
nRBC: 0 % (ref 0.0–0.2)

## 2018-10-13 LAB — DIFFERENTIAL
Abs Immature Granulocytes: 0.07 10*3/uL (ref 0.00–0.07)
Basophils Absolute: 0.1 10*3/uL (ref 0.0–0.1)
Basophils Relative: 1 %
Eosinophils Absolute: 0.1 10*3/uL (ref 0.0–0.5)
Eosinophils Relative: 2 %
Immature Granulocytes: 1 %
Lymphocytes Relative: 25 %
Lymphs Abs: 1.9 10*3/uL (ref 0.7–4.0)
Monocytes Absolute: 0.8 10*3/uL (ref 0.1–1.0)
Monocytes Relative: 10 %
Neutro Abs: 4.8 10*3/uL (ref 1.7–7.7)
Neutrophils Relative %: 61 %

## 2018-10-13 LAB — TROPONIN I: Troponin I: 0.03 ng/mL (ref <0.03)

## 2018-10-13 LAB — TSH: TSH: 3.24 u[IU]/mL (ref 0.350–4.500)

## 2018-10-13 LAB — MAGNESIUM: Magnesium: 1.5 mg/dL — ABNORMAL LOW (ref 1.7–2.4)

## 2018-10-13 LAB — BRAIN NATRIURETIC PEPTIDE: B Natriuretic Peptide: 1262.8 pg/mL — ABNORMAL HIGH (ref 0.0–100.0)

## 2018-10-13 MED ORDER — RAMELTEON 8 MG PO TABS
8.0000 mg | ORAL_TABLET | Freq: Every evening | ORAL | Status: DC | PRN
  Filled 2018-10-13: qty 1

## 2018-10-13 MED ORDER — HYDROCODONE-ACETAMINOPHEN 5-325 MG PO TABS
1.0000 | ORAL_TABLET | Freq: Every day | ORAL | Status: DC
  Administered 2018-10-13 – 2018-10-14 (×2): 1 via ORAL
  Filled 2018-10-13 (×2): qty 1

## 2018-10-13 MED ORDER — METOPROLOL TARTRATE 50 MG PO TABS
75.0000 mg | ORAL_TABLET | Freq: Two times a day (BID) | ORAL | Status: DC
  Administered 2018-10-13 (×2): 75 mg via ORAL
  Filled 2018-10-13 (×2): qty 1

## 2018-10-13 MED ORDER — SODIUM CHLORIDE 0.9% FLUSH
3.0000 mL | Freq: Two times a day (BID) | INTRAVENOUS | Status: DC
  Administered 2018-10-13 – 2018-10-20 (×14): 3 mL via INTRAVENOUS

## 2018-10-13 MED ORDER — ACETAMINOPHEN 325 MG PO TABS
650.0000 mg | ORAL_TABLET | ORAL | Status: DC | PRN
  Administered 2018-10-15 – 2018-10-17 (×3): 650 mg via ORAL
  Filled 2018-10-13 (×3): qty 2

## 2018-10-13 MED ORDER — SODIUM CHLORIDE 0.9% FLUSH: 3.0000 mL | INTRAVENOUS | Status: DC | PRN

## 2018-10-13 MED ORDER — DILTIAZEM LOAD VIA INFUSION
20.0000 mg | Freq: Once | INTRAVENOUS | Status: AC
  Administered 2018-10-13: 20 mg via INTRAVENOUS
  Filled 2018-10-13: qty 20

## 2018-10-13 MED ORDER — ENOXAPARIN SODIUM 40 MG/0.4ML ~~LOC~~ SOLN
40.0000 mg | SUBCUTANEOUS | Status: DC
  Administered 2018-10-13 – 2018-10-14 (×2): 40 mg via SUBCUTANEOUS
  Filled 2018-10-13 (×2): qty 0.4

## 2018-10-13 MED ORDER — LEVALBUTEROL TARTRATE 45 MCG/ACT IN AERO
1.0000 | INHALATION_SPRAY | Freq: Four times a day (QID) | RESPIRATORY_TRACT | Status: DC | PRN
  Administered 2018-10-16: 22:00:00 2 via RESPIRATORY_TRACT
  Filled 2018-10-13: qty 15

## 2018-10-13 MED ORDER — DILTIAZEM HCL-DEXTROSE 100-5 MG/100ML-% IV SOLN (PREMIX)
5.0000 mg/h | INTRAVENOUS | Status: AC
  Administered 2018-10-13: 5 mg/h via INTRAVENOUS
  Filled 2018-10-13: qty 100

## 2018-10-13 MED ORDER — MIRTAZAPINE 7.5 MG PO TABS
7.5000 mg | ORAL_TABLET | Freq: Every evening | ORAL | Status: DC | PRN
  Administered 2018-10-13 – 2018-10-19 (×7): 7.5 mg via ORAL
  Filled 2018-10-13 (×7): qty 1

## 2018-10-13 MED ORDER — FAMOTIDINE 20 MG PO TABS
20.0000 mg | ORAL_TABLET | Freq: Every evening | ORAL | Status: DC | PRN
  Filled 2018-10-13: qty 1

## 2018-10-13 MED ORDER — CITALOPRAM HYDROBROMIDE 20 MG PO TABS
20.0000 mg | ORAL_TABLET | Freq: Every day | ORAL | Status: DC
  Administered 2018-10-13 – 2018-10-20 (×8): 20 mg via ORAL
  Filled 2018-10-13 (×8): qty 1

## 2018-10-13 MED ORDER — PANTOPRAZOLE SODIUM 40 MG PO TBEC
40.0000 mg | DELAYED_RELEASE_TABLET | Freq: Every day | ORAL | Status: DC
  Administered 2018-10-13 – 2018-10-20 (×8): 40 mg via ORAL
  Filled 2018-10-13 (×8): qty 1

## 2018-10-13 MED ORDER — LEVOTHYROXINE SODIUM 75 MCG PO TABS
75.0000 ug | ORAL_TABLET | Freq: Every day | ORAL | Status: DC
  Administered 2018-10-13 – 2018-10-20 (×8): 75 ug via ORAL
  Filled 2018-10-13 (×8): qty 1

## 2018-10-13 MED ORDER — SODIUM CHLORIDE 0.9 % IV SOLN: 250.0000 mL | INTRAVENOUS | Status: DC | PRN

## 2018-10-13 MED ORDER — FUROSEMIDE 10 MG/ML IJ SOLN
40.0000 mg | Freq: Once | INTRAMUSCULAR | Status: AC
  Administered 2018-10-13: 08:00:00 40 mg via INTRAVENOUS
  Filled 2018-10-13: qty 4

## 2018-10-13 MED ORDER — CALCIUM CARBONATE ANTACID 500 MG PO CHEW: 2.0000 | CHEWABLE_TABLET | Freq: Every day | ORAL | Status: DC | PRN

## 2018-10-13 MED ORDER — BUSPIRONE HCL 5 MG PO TABS
5.0000 mg | ORAL_TABLET | Freq: Two times a day (BID) | ORAL | Status: DC
  Administered 2018-10-13 – 2018-10-20 (×15): 5 mg via ORAL
  Filled 2018-10-13 (×15): qty 1

## 2018-10-13 MED ORDER — MAGNESIUM SULFATE 2 GM/50ML IV SOLN
2.0000 g | Freq: Once | INTRAVENOUS | Status: AC
  Administered 2018-10-13: 2 g via INTRAVENOUS
  Filled 2018-10-13: qty 50

## 2018-10-13 MED ORDER — SODIUM CHLORIDE 0.9% FLUSH
3.0000 mL | Freq: Once | INTRAVENOUS | Status: AC
  Administered 2018-10-13: 07:00:00 3 mL via INTRAVENOUS

## 2018-10-13 NOTE — ED Triage Notes (Signed)
Patient arrives via EMS from home alone; Son lives close by; Per EMS patient called Son at 11:30pm due to leg weakness, Son arrived and helped patient to bed; pt called again at 0400 am with c/o SOB; Patient has hx of Afib RVR with cardioversion; Pt was showing Afib RVR with EMS; pt denies chest pain but has 2 + pitting edema in bilateral LE; Pt has not seen cardiologist yet due to Covid-19. Pt has not taken lasix in a couple of months due to dehydration issues; Pt a&ox 4 on arrival.-Monique,RN

## 2018-10-13 NOTE — H&P (Addendum)
Date: 10/13/2018               Patient Name:  Teresa [name] MRN: [id number]  DOB: April 14, 1941 Age / Sex: 78 y.o., female   PCP: [name], MD         Medical Service: Internal Medicine Teaching Service         Attending Physician: Dr. Sid Falcon, MD    First Contact: Dr. Gaye Alken Pager: [phone]  Second Contact: Dr. Mertha Finders Pager: [phone]       After Hours (After 5p/  First Contact Pager: [phone]  weekends / holidays): Second Contact Pager: [phone]   Chief Complaint: dyspnea  History of Present Illness: 78 y.o. yo female w/ PMH significant for afib, HFrEF non ischemic, hypothyroidism, anxiety.  Presents for extreme tiredness and shortness of breath on exertion.  No cp, No N/V does have occasional diarrhea at baseline.  No fevers or chills does not feel ill.  Lives by herself, no sick contacts.  Usually is short of breath off and on but for the last week has had no improvement.  Does not weigh herself at home no scale.  She has access to all medications except for lasix (never filled) and has been taking daily with the help of her son.  Drinking diet pepsi and water drinks 3 bottles per day, eating regular food a lot of vegetables, uses salt with no restriction.     I spoke with her son as well who feels she has never really bounced back from her last admission.  He feels she returned to afib shortly after cardioversion.  He is also concerned that she has been getting weaker and sometimes needs help getting up.  He tells me she was discharged without anticoagulation for afib so there could be a joint decision between him cardiology and the patient in the outpatient setting.  ED course: In the Ed patient was afib w/ rate of 110 started on diltiazem infusion with improvement in rate, given 40mg  IV lasix    Meds:  Current Meds  Medication Sig  . albuterol (PROAIR HFA) 108 (90 Base) MCG/ACT inhaler USE 2 PUFFS EVERY 6 HOURS AS NEEDED FOR SHORTNESS OF  BREATH AND WHEEZING.  Marland Kitchen atorvastatin (LIPITOR) 10 MG tablet Take 1 tablet (10 mg total) by mouth daily.  . busPIRone (BUSPAR) 5 MG tablet Take 1 tablet (5 mg total) by mouth 2 (two) times daily.  . calcium carbonate (TUMS - DOSED IN MG ELEMENTAL CALCIUM) 500 MG chewable tablet Chew 2 tablets by mouth daily as needed for indigestion or heartburn.  . citalopram (CELEXA) 20 MG tablet TAKE 1 TABLET EACH DAY. (Patient taking differently: Take 20 mg by mouth daily. )  . famotidine (PEPCID) 20 MG tablet Take 20 mg by mouth 2 (two) times daily.  . furosemide (LASIX) 20 MG tablet Take 1 tablet (20 mg total) by mouth daily as needed for fluid or edema.  Marland Kitchen HYDROcodone-acetaminophen (NORCO/VICODIN) 5-325 MG tablet Take 1 tablet by mouth 5 (five) times daily.   Marland Kitchen levothyroxine (SYNTHROID, LEVOTHROID) 75 MCG tablet TAKE 1 TABLET ONCE DAILY BEFORE BREAKFAST. (Patient taking differently: Take 75 mcg by mouth daily before breakfast. )  . metoprolol succinate (TOPROL-XL) 50 MG 24 hr tablet Take 1 tablet (50 mg total) by mouth 2 (two) times daily. Take with or immediately following a meal.  . mirtazapine (REMERON) 7.5 MG tablet TAKE ONE TABLET AT BEDTIME. (Patient taking differently: Take 7.5 mg by  mouth at bedtime. )  . omeprazole (PRILOSEC OTC) 20 MG tablet Take 20 mg by mouth daily.  . Probiotic Product (PRO-BIOTIC BLEND PO) Take 1 tablet by mouth daily.  Marland Kitchen Spacer/Aero-Holding Chambers (AEROCHAMBER MV) inhaler Use as instructed     Allergies: Allergies as of 10/13/2018 - Review Complete 10/13/2018  Allergen Reaction Noted  . Aspirin Other (See Comments) 05/27/2011  . Nsaids Other (See Comments) 10/12/2012  . Tolmetin Rash 12/14/2012   Past Medical History:  Diagnosis Date  . Abdominal pain   . Anxiety    maintained on Xanax tid for years.  . Arthritis    FEET, KNEES, HANDS  . Chronic systolic CHF (congestive heart failure), NYHA class 2 ([place])    EF 25% 2011 MV,  50-55% echo 6/13  . Diabetes mellitus  type 2, diet-controlled ([place])   . GERD (gastroesophageal reflux disease)   . Glaucoma    s/p surgery B in 30s.  [place].  . H/O hiatal hernia   . History of glaucoma   . History of small bowel obstruction 2009 AND NOV 2012  . HTN (hypertension)   . Hypothyroidism   . Left ovarian cyst   . Moderate mitral regurgitation   . Moderate tricuspid regurgitation   . Non-healing surgical wound    ABDOMINAL S/P EXPLORATOY LAPAROTOMY 04-18-2011  . Nonischemic cardiomyopathy ([place]) [facility]    EF is 25% per echo February 2012, non ischemic myoview 12/2009.  EF 50% echo 7/12 and plans for ICD cancelled.   . Peptic ulcer disease   . PVC (premature ventricular contraction)   . Shortness of breath   . Spinal stenosis   . Urge urinary incontinence   . Valvular heart disease    Moderate to severe MR, moderate TR, LAE per echo 7/11  . Wears dentures    upper denture-lower partial    Family History:  Family History  Problem Relation Age of Onset  . Stroke Brother   . Diabetes Brother   . Tuberculosis Father   . Stroke Mother   . Stroke Sister   . Heart disease Sister   . Emphysema Sister   . Diabetes Daughter      Social History:  Social History   Socioeconomic History  . Marital status: Widowed    Spouse name: Not on file  . Number of children: 2  . Years of education: Not on file  . Highest education level: Not on file  Occupational History  . Occupation: RETIRED  Social Needs  . Financial resource strain: Not on file  . Food insecurity:    Worry: Not on file    Inability: Not on file  . Transportation needs:    Medical: Not on file    Non-medical: Not on file  Tobacco Use  . Smoking status: Former Smoker    Packs/day: 1.50    Years: 45.00    Pack years: 67.50    Types: Cigarettes    Start date: 11/14/1964    Last attempt to quit: 09/22/2010    Years since quitting: 8.0  . Smokeless tobacco: Never Used  . Tobacco comment: quit 2011 or 2012 - Didn't smoke during  pregnancies  Substance and Sexual Activity  . Alcohol use: No  . Drug use: No  . Sexual activity: Not Currently  Lifestyle  . Physical activity:    Days per week: Not on file    Minutes per session: Not on file  . Stress: Not on file  Relationships  .  Social connections:    Talks on phone: Not on file    Gets together: Not on file    Attends religious service: Not on file    Active member of club or organization: Not on file    Attends meetings of clubs or organizations: Not on file    Relationship status: Not on file  . Intimate partner violence:    Fear of current or ex partner: Not on file    Emotionally abused: Not on file    Physically abused: Not on file    Forced sexual activity: Not on file  Other Topics Concern  . Not on file  Social History Narrative   Marital status:  Widowed as of 09/06/10; not dating.        Children: four children (1 daughter in [place], 1 daughter in [place] estranged; 1 son in [place]; 1 son deceased in September 06, 2014); six grandchildren; 5 gg.      Lives: alone with a yorkie.  Son/Harry lives close.        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      Tobacco:  None; quit in 09/06/10.        Alcohol: none      Drugs:none      Exercise:  No formal exercise program.  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  Cannot swim.        ADLs:  Driving; cleans own house; does grocery shopping; pays bills; does not use cane for ambulation.  Drives.        Advanced Directives: no living will; no HCPOA.  DNR/DNI.        [facility]:   Originally from [facility]. Has always lived in [place]. She moved to [place], [place]. 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 Has a dog currently. No bird or mold exposure.      Review of Systems: A complete ROS was negative except as per HPI.   Physical Exam: Blood pressure (!) 132/98, pulse 80, temperature 97.7 F (36.5 C), temperature source Oral, resp. rate (!) 21,  height 5\' 3"  (1.6 m), weight 96.9 kg, SpO2 100 %. Physical Exam Constitutional:      General: She is not in acute distress.    Appearance: She is obese. She is not diaphoretic.  Eyes:     Extraocular Movements: Extraocular movements intact.  Neck:     Vascular: Hepatojugular reflux present.  Cardiovascular:     Rate and Rhythm: Normal rate. Rhythm irregular.     Heart sounds: Heart sounds are distant. No murmur. No friction rub. No gallop.   Pulmonary:     Effort: Pulmonary effort is normal. No respiratory distress.     Breath sounds: Examination of the left-lower field reveals rales. Rales present. No wheezing.  Chest:     Chest wall: No tenderness.  Abdominal:     General: Bowel sounds are normal. There is no distension.     Palpations: Abdomen is soft. There is no mass.     Tenderness: There is no abdominal tenderness. There is no guarding or rebound.  Musculoskeletal:     Right lower leg: Edema (2+) present.     Left lower leg: Edema (2+) present.  Skin:    General: Skin is warm and dry.  Neurological:     Mental Status: She is alert.  Psychiatric:        Mood and Affect: Mood normal.  Behavior: Behavior normal.      EKG: personally reviewed my interpretation is initial ecg Afib with rate 117  CXR: personally reviewed my interpretation is left pleural effusion, new interval increase in interstitial edema bilaterally  Assessment & Plan by Problem: Active Problems:   Atrial fibrillation (HCC)  HFrEF: presumed nonischemic no cath but had nuclear stress which was neg for ischemia 2011.  Worsened by atrial fibrillation.  EF around 40% on last two recent ECHO studies also shows valvular disease mod-sev MR, mild-mod TR  -add on bnp -already received IV lasix 40mg  which I feel is an appropriate dosage -strict intake and output/daily weights -iron studies  Afib: rate controlled on diltiazem drip in the ED.  Would preferentially get pt back on metoprolol, may need to  titrate dosage.  No current suspicion for low output state.  No AC with history of peptic ulcer disease and was not discharged on eliquis.    -d/c diltiazem and switch back to metoprolol will start with tartrate while titrating dosage then can convert back to succinate.  Start with 75mg  -needs a joint decision making conversation about anticoagulation in the context of peptic ulcer disease and GI bleeding in the past. Was supposed to happen with cardiologist on discharge and eliquis was not prescribed on discharge  Pleural Effusion: left sided, likely in the setting of worsening CHF  -consider further characterization with bedside US  Thrombocytopenia: mild uncertain etiology at this time  -Check liver function panel with morning labs   Macrocytosis: new compared with 3 months prior, also uncertain etiology  -folate and B12  Hypothyroidism: stable and appears to be mild.  TSH wnl  -continue home synthroid  Anxiety: history of GAD, on buspar at home, not overly anxious today on examination, main issue is dyspnea which has a history of contributing to her anxiety  -continue home buspar  Weakness/decompensation: in the setting of worsening HFrEF and afib  -PT OT  Dispo: Admit patient to Inpatient with expected length of stay greater than 2 midnights.  Signed: Katherine Roan, MD 10/13/2018, 11:10 AM

## 2018-10-13 NOTE — ED Notes (Signed)
Patient denies pain and is resting comfortably.  

## 2018-10-13 NOTE — ED Provider Notes (Signed)
[facility] EMERGENCY DEPARTMENT Provider Note   CSN: [id number] Arrival date & time: 10/13/18  9163    History   Chief Complaint Chief Complaint  Patient presents with  . Shortness of Breath  . Atrial Fibrillation    HPI Teresa [name] is a 78 y.o. female.     Pt presents to the ED today with sob and weakness.  The pt was admitted from 2/25-3/3 for ARF and new onset afib.  The pt was cardioverted on 08/16/18.  She was supposed to be prescribed Eliquis, but that did not get prescribed.  The pt was on lasix, but was taken off the lasix due to ARF.  She has been noticing some sob and some leg swelling.  Her son was contacted by phone and said she's not been able to walk more than a few feet without having to stop.  CHA2DS2/VAS Stroke Risk Points  Current as of 13 minutes ago     6 >= 2 Points: High Risk  1 - 1.99 Points: Medium Risk  0 Points: Low Risk    The patient's score has not changed in the past year.:  No Change     Details    This score determines the patient's risk of having a stroke if the  patient has atrial fibrillation.       Points Metrics  1 Has Congestive Heart Failure:  Yes    Current as of 13 minutes ago  0 Has Vascular Disease:  No    Current as of 13 minutes ago  1 Has Hypertension:  Yes    Current as of 13 minutes ago  2 Age:  14    Current as of 13 minutes ago  1 Has Diabetes:  Yes    Current as of 13 minutes ago  0 Had Stroke:  No  Had TIA:  No  Had thromboembolism:  No    Current as of 13 minutes ago  1 Female:  Yes    Current as of 13 minutes ago              Past Medical History:  Diagnosis Date  . Abdominal pain   . Anxiety    maintained on Xanax tid for years.  . Arthritis    FEET, KNEES, HANDS  . Chronic systolic CHF (congestive heart failure), NYHA class 2 ([place])    EF 25% 2011 MV,  50-55% echo 6/13  . Diabetes mellitus type 2, diet-controlled ([place])   . GERD (gastroesophageal reflux disease)   . Glaucoma     s/p surgery B in 30s.  [place].  . H/O hiatal hernia   . History of glaucoma   . History of small bowel obstruction 2009 AND NOV 2012  . HTN (hypertension)   . Hypothyroidism   . Left ovarian cyst   . Moderate mitral regurgitation   . Moderate tricuspid regurgitation   . Non-healing surgical wound    ABDOMINAL S/P EXPLORATOY LAPAROTOMY 04-18-2011  . Nonischemic cardiomyopathy ([place]) [facility]    EF is 25% per echo February 2012, non ischemic myoview 12/2009.  EF 50% echo 7/12 and plans for ICD cancelled.   . Peptic ulcer disease   . PVC (premature ventricular contraction)   . Shortness of breath   . Spinal stenosis   . Urge urinary incontinence   . Valvular heart disease    Moderate to severe MR, moderate TR, LAE per echo 7/11  . Wears dentures    upper  denture-lower partial    Patient Active Problem List   Diagnosis Date Noted  . Pressure injury of skin 08/12/2018  . Acute renal failure (ARF) ([place]) 08/10/2018  . Hypotension due to hypovolemia 08/10/2018  . Hypokalemia 08/10/2018  . Hyponatremia 08/10/2018  . Metabolic acidosis, increased anion gap 08/10/2018  . Hypocalcemia 08/10/2018  . Elevated troponin 08/10/2018  . Prolonged QT interval 08/10/2018  . Atrial fibrillation with RVR ([place]) 08/10/2018  . Pancreatic pseudocyst 11/09/2017  . Polymyalgia rheumatica ([place]) 10/12/2016  . Anxiety and depression 10/12/2016  . Bilateral hip pain 06/24/2016  . Chronic pain of both shoulders 06/24/2016  . Psychophysiological insomnia 05/12/2016  . Spinal stenosis   . History of glaucoma   . History of small bowel obstruction   . Peptic ulcer disease   . Moderate mitral regurgitation   . Moderate tricuspid regurgitation   . H/O hiatal hernia   . Urge urinary incontinence   . Left ovarian cyst   . Diabetes mellitus type 2, diet-controlled ([place])   . Shortness of breath   . Wears dentures   . Hypothyroidism   . Generalized anxiety disorder 09/15/2013  . Hernia, incisional  05/10/2012  . Abdominal wall hernia 02/10/2012  . Dyslipidemia 05/01/2011  . GERD (gastroesophageal reflux disease) 05/01/2011  . Osteoarthritis 05/01/2011  . Chronic renal insufficiency 05/01/2011  . HLD (hyperlipidemia) 05/01/2011  . SBO (small bowel obstruction), s/p expl lap 04/24/2011  . Nonischemic cardiomyopathy ([place])   . Arthritis of ankle or foot, degenerative 07/12/2010  . Arthritis of hand, degenerative 07/12/2010  . Malaise and fatigue 07/12/2010  . Essential (primary) hypertension 07/12/2010  . Glaucoma 07/12/2010    Past Surgical History:  Procedure Laterality Date  . ABDOMINAL HERNIA REPAIR  32 YRS AGO   PERITINITIS  . APPENDECTOMY  1979   RUPTURED  . BENIGN RIGHT BREAST TUMOR REMOVED    . CARDIOVASCULAR STRESS TEST  JULY 2011-  DR HOCHREIN   NO ISCHEMIA  . CARDIOVERSION N/A 08/17/2018   Procedure: CARDIOVERSION;  Surgeon: Sueanne Margarita, MD;  Location: [facility] ENDOSCOPY;  Service: Cardiovascular;  Laterality: N/A;  . EXPLORATOMY LAP. / EXTENSIVE LYSIS ADHESIONS/ SMALL BOWEL RESECTION X2 WITH PRIMARY ANASTOMOSIS X2  04-18-2011   SMALL BOWEL OBSTRUCTION  . GLAUCOMA SURGERY  1978  . HERNIA REPAIR    . INCISION AND DRAINAGE OF WOUND N/A 09/13/2012   Procedure: INCISION  AND DEBRIDEMENT WOUND abdominal wall ;  Surgeon: Rolm Bookbinder, MD;  Location: [place];  Service: General;  Laterality: N/A;  coordination with Dr Migdalia Dk   . KNEE ARTHROSCOPY W/ MENISCECTOMY  06-05-2004  . TEE WITHOUT CARDIOVERSION N/A 08/17/2018   Procedure: TRANSESOPHAGEAL ECHOCARDIOGRAM (TEE);  Surgeon: Sueanne Margarita, MD;  Location: [facility] ENDOSCOPY;  Service: Cardiovascular;  Laterality: N/A;  . THYROIDECTOMY  1978 GOITOR   AND CERVICAL COLD KNIFE CONE BX  . TONSILLECTOMY  AGE 24  . TRANSTHORACIC ECHOCARDIOGRAM  12-27-2010   EF 45-50%/ MODERATE MV REGURG. / LEFT ATRIUM MILDLY DILATED/ MILD TRICUSPID REGURG.  . TUBAL LIGATION    . VENTRAL HERNIA REPAIR  X4  LAST ONE 20 YRS AGO   ABDOMINAL --  EACH ONE  IN DIFFERENT AREA  . VENTRAL HERNIA REPAIR  01/29/2012   Procedure: HERNIA REPAIR VENTRAL ADULT;  Surgeon: Theodoro Kos, DO;  Location: [place];  Service: Plastics;  Laterality: N/A;  . WOUND DEBRIDEMENT  09/25/2011   Procedure: DEBRIDEMENT CLOSURE/ABDOMINAL WOUND;  Surgeon: Theodoro Kos, DO;  Location: [place];  Service: Clinical cytogeneticist;  Laterality: N/A;  excision of abdominal wound with primary closure     OB History   No obstetric history on file.      Home Medications    Prior to Admission medications   Medication Sig Start Date End Date Taking? Authorizing Provider  albuterol (PROAIR HFA) 108 (90 Base) MCG/ACT inhaler USE 2 PUFFS EVERY 6 HOURS AS NEEDED FOR SHORTNESS OF BREATH AND WHEEZING. 10/05/18  Yes Stallings, Zoe A, MD  atorvastatin (LIPITOR) 10 MG tablet Take 1 tablet (10 mg total) by mouth daily. 09/16/18  Yes Lendon Colonel, NP  busPIRone (BUSPAR) 5 MG tablet Take 1 tablet (5 mg total) by mouth 2 (two) times daily. 06/07/18  Yes [name], MD  calcium carbonate (TUMS - DOSED IN MG ELEMENTAL CALCIUM) 500 MG chewable tablet Chew 2 tablets by mouth daily as needed for indigestion or heartburn.   Yes [provider]  citalopram (CELEXA) 20 MG tablet TAKE 1 TABLET EACH DAY. Patient taking differently: Take 20 mg by mouth daily.  06/28/18  Yes [name], MD  famotidine (PEPCID) 20 MG tablet Take 20 mg by mouth 2 (two) times daily.   Yes [provider]  furosemide (LASIX) 20 MG tablet Take 1 tablet (20 mg total) by mouth daily as needed for fluid or edema. 09/16/18  Yes Lendon Colonel, NP  HYDROcodone-acetaminophen (NORCO/VICODIN) 5-325 MG tablet Take 1 tablet by mouth 5 (five) times daily.    Yes [provider]  levothyroxine (SYNTHROID, LEVOTHROID) 75 MCG tablet TAKE 1 TABLET ONCE DAILY BEFORE BREAKFAST. Patient taking differently: Take 75 mcg by mouth daily before breakfast.  11/09/17  Yes Wardell Honour, MD  metoprolol succinate (TOPROL-XL) 50 MG 24 hr tablet Take 1 tablet (50 mg total) by mouth 2 (two) times daily. Take with or immediately following a meal. 09/16/18  Yes Lendon Colonel, NP  mirtazapine (REMERON) 7.5 MG tablet TAKE ONE TABLET AT BEDTIME. 08/24/18  Yes [name], MD  Probiotic Product (PRO-BIOTIC BLEND PO) Take 1 tablet by mouth daily.   Yes [provider]  Spacer/Aero-Holding Chambers (AEROCHAMBER MV) inhaler Use as instructed 05/01/16  Yes Javier Glazier, MD  pantoprazole (PROTONIX) 40 MG tablet TAKE 1 TABLET ONCE DAILY. Patient not taking: No sig reported 12/03/17   Wardell Honour, MD    Family History Family History  Problem Relation Age of Onset  . Stroke Brother   . Diabetes Brother   . Tuberculosis Father   . Stroke Mother   . Stroke Sister   . Heart disease Sister   . Emphysema Sister   . Diabetes Daughter     Social History Social History   Tobacco Use  . Smoking status: Former Smoker    Packs/day: 1.50    Years: 45.00    Pack years: 67.50    Types: Cigarettes    Start date: 11/14/1964    Last attempt to quit: 09/22/2010    Years since quitting: 8.0  . Smokeless tobacco: Never Used  . Tobacco comment: quit 2011 or 2012 - Didn't smoke during pregnancies  Substance Use Topics  . Alcohol use: No  . Drug use: No     Allergies   Aspirin; Nsaids; and Tolmetin   Review of Systems Review of Systems  Respiratory: Positive for shortness of breath.   Neurological: Positive for weakness.  All other systems reviewed and are negative.    Physical Exam Updated Vital Signs BP Marland Kitchen)  132/98   Pulse 74   Temp 97.7 F (36.5 C) (Oral)   Resp (!) 26   Ht 5\' 3"  (1.6 m)   Wt 96.9 kg   SpO2 100%   BMI 37.84 kg/m   Physical Exam Vitals signs and nursing note reviewed.  Constitutional:      Appearance: She is well-developed.  HENT:     Head: Normocephalic and atraumatic.     Mouth/Throat:     Mouth: Mucous membranes are  moist.     Pharynx: Oropharynx is clear.  Eyes:     Extraocular Movements: Extraocular movements intact.     Pupils: Pupils are equal, round, and reactive to light.  Neck:     Musculoskeletal: Normal range of motion and neck supple.  Cardiovascular:     Rate and Rhythm: Tachycardia present. Rhythm irregular.  Pulmonary:     Effort: Pulmonary effort is normal.     Breath sounds: Normal breath sounds.  Abdominal:     General: Bowel sounds are normal.     Palpations: Abdomen is soft.  Musculoskeletal: Normal range of motion.     Right lower leg: Edema present.     Left lower leg: Edema present.  Skin:    General: Skin is warm.     Capillary Refill: Capillary refill takes less than 2 seconds.  Neurological:     General: No focal deficit present.     Mental Status: She is alert and oriented to person, place, and time.  Psychiatric:        Mood and Affect: Mood normal.        Behavior: Behavior normal.      ED Treatments / Results  Labs (all labs ordered are listed, but only abnormal results are displayed) Labs Reviewed  BASIC METABOLIC PANEL - Abnormal; Notable for the following components:      Result Value   CO2 16 (*)    Glucose, Bld 132 (*)    BUN 26 (*)    Creatinine, Ser 1.57 (*)    Calcium 8.6 (*)    GFR calc non Af Amer 31 (*)    GFR calc Af Amer 36 (*)    Anion gap 18 (*)    All other components within normal limits  CBC - Abnormal; Notable for the following components:   MCV 102.1 (*)    Platelets 122 (*)    All other components within normal limits  MAGNESIUM - Abnormal; Notable for the following components:   Magnesium 1.5 (*)    All other components within normal limits  TROPONIN I - Abnormal; Notable for the following components:   Troponin I 0.03 (*)    All other components within normal limits  TSH  BRAIN NATRIURETIC PEPTIDE    EKG EKG Interpretation  Date/Time:  Wednesday October 13 2018 08:55:42 EDT Ventricular Rate:  77 PR Interval:    QRS  Duration: 113 QT Interval:  394 QTC Calculation: 446 R Axis:   -36 Text Interpretation:  Atrial fibrillation Incomplete right bundle branch block Anterior infarct, age indeterminate Since last tracing rate slower after cardizem Confirmed by Isla Pence [phone]) on 10/13/2018 9:01:36 AM   Radiology Dg Chest 2 View  Result Date: 10/13/2018 CLINICAL DATA:  Left leg weakness last night. History of atrial fibrillation. Shortness of breath. EXAM: CHEST - 2 VIEW COMPARISON:  One-view chest x-ray 08/10/2018 FINDINGS: The heart is enlarged. Atherosclerotic calcifications are present at the aortic arch. A left pleural effusion is present. Left greater than  right basilar airspace disease is noted. Mild pulmonary vascular congestion is noted. The visualized soft tissues and bony thorax are unremarkable. IMPRESSION: 1. Cardiomegaly with new left pleural effusion and pulmonary vascular congestion concerning for congestive heart failure. 2. Left greater than right basilar airspace disease. While this likely represents atelectasis, infection is not excluded. Electronically Signed   By: San Morelle M.D.   On: 10/13/2018 07:30    Procedures Procedures (including critical care time)  Medications Ordered in ED Medications  diltiazem (CARDIZEM) 1 mg/mL load via infusion 20 mg (20 mg Intravenous Bolus from Bag 10/13/18 0807)    And  diltiazem (CARDIZEM) 100 mg in dextrose 5% 174mL (1 mg/mL) infusion (5 mg/hr Intravenous New Bag/Given 10/13/18 0806)  sodium chloride flush (NS) 0.9 % injection 3 mL (3 mLs Intravenous Given 10/13/18 0725)  furosemide (LASIX) injection 40 mg (40 mg Intravenous Given 10/13/18 7416)     Initial Impression / Assessment and Plan / ED Course  I have reviewed the triage vital signs and the nursing notes.  Pertinent labs & imaging results that were available during my care of the patient were reviewed by me and considered in my medical decision making (see chart for details).       Pt started on a cardizem bolus and drip.  HR has decreased with treatment.  She is not a cardioversion candidate as she's not been on Eliquis.  Heparin per pharmacy was also ordered as she is not anticoagulated.    Pt also hypomagnesemic, so she was given 2g of mag.  CXR shows CHF, so pt was given 40 mg of lasix.  She will need gentle diuresis due to kidney function.  [name] son called on the phone and updated.  Pt d/w IMTS for admission.  CRITICAL CARE Performed by: Isla Pence   Total critical care time: 30 minutes  Critical care time was exclusive of separately billable procedures and treating other patients.  Critical care was necessary to treat or prevent imminent or life-threatening deterioration.  Critical care was time spent personally by me on the following activities: development of treatment plan with patient and/or surrogate as well as nursing, discussions with consultants, evaluation of patient's response to treatment, examination of patient, obtaining history from patient or surrogate, ordering and performing treatments and interventions, ordering and review of laboratory studies, ordering and review of radiographic studies, pulse oximetry and re-evaluation of patient's condition.  Final Clinical Impressions(s) / ED Diagnoses   Final diagnoses:  Atrial fibrillation with RVR ([place])  Acute on chronic congestive heart failure, unspecified heart failure type ([place])  AKI (acute kidney injury) [facility])  Hypomagnesemia    ED Discharge Orders    None       Isla Pence, MD 10/13/18 [phone]

## 2018-10-13 NOTE — ED Notes (Signed)
Nurse navigator spoke with with patient and son regarding plan of care. Patient is hard of hearing and son was appreciative of the phone call and told his mom he loved her. Patient resting comfortably on stretcher.

## 2018-10-13 NOTE — ED Notes (Signed)
Patient transported to X-ray 

## 2018-10-13 NOTE — Progress Notes (Signed)
Spoke with pt's son [name]. Stated he had to go to pt's house last night to help her out of the floor. He stated she said she did not fall, just slid to the floor. Carroll Kinds RN

## 2018-10-14 DIAGNOSIS — Z8711 Personal history of peptic ulcer disease: Secondary | ICD-10-CM

## 2018-10-14 DIAGNOSIS — F419 Anxiety disorder, unspecified: Secondary | ICD-10-CM

## 2018-10-14 DIAGNOSIS — F329 Major depressive disorder, single episode, unspecified: Secondary | ICD-10-CM

## 2018-10-14 DIAGNOSIS — R740 Nonspecific elevation of levels of transaminase and lactic acid dehydrogenase [LDH]: Secondary | ICD-10-CM

## 2018-10-14 LAB — HEPATIC FUNCTION PANEL
ALT: 59 U/L — ABNORMAL HIGH (ref 0–44)
AST: 37 U/L (ref 15–41)
Albumin: 3.3 g/dL — ABNORMAL LOW (ref 3.5–5.0)
Alkaline Phosphatase: 148 U/L — ABNORMAL HIGH (ref 38–126)
Bilirubin, Direct: 0.3 mg/dL — ABNORMAL HIGH (ref 0.0–0.2)
Indirect Bilirubin: 0.8 mg/dL (ref 0.3–0.9)
Total Bilirubin: 1.1 mg/dL (ref 0.3–1.2)
Total Protein: 5.7 g/dL — ABNORMAL LOW (ref 6.5–8.1)

## 2018-10-14 LAB — CBC
HCT: 40 % (ref 36.0–46.0)
Hemoglobin: 12.6 g/dL (ref 12.0–15.0)
MCH: 31.8 pg (ref 26.0–34.0)
MCHC: 31.5 g/dL (ref 30.0–36.0)
MCV: 101 fL — ABNORMAL HIGH (ref 80.0–100.0)
Platelets: 105 10*3/uL — ABNORMAL LOW (ref 150–400)
RBC: 3.96 MIL/uL (ref 3.87–5.11)
RDW: 15.2 % (ref 11.5–15.5)
WBC: 8.2 10*3/uL (ref 4.0–10.5)
nRBC: 0 % (ref 0.0–0.2)

## 2018-10-14 LAB — IRON AND TIBC
Iron: 43 ug/dL (ref 28–170)
Saturation Ratios: 15 % (ref 10.4–31.8)
TIBC: 290 ug/dL (ref 250–450)
UIBC: 247 ug/dL

## 2018-10-14 LAB — BASIC METABOLIC PANEL
Anion gap: 12 (ref 5–15)
BUN: 25 mg/dL — ABNORMAL HIGH (ref 8–23)
CO2: 21 mmol/L — ABNORMAL LOW (ref 22–32)
Calcium: 8.5 mg/dL — ABNORMAL LOW (ref 8.9–10.3)
Chloride: 105 mmol/L (ref 98–111)
Creatinine, Ser: 1.47 mg/dL — ABNORMAL HIGH (ref 0.44–1.00)
GFR calc Af Amer: 39 mL/min — ABNORMAL LOW (ref >60)
GFR calc non Af Amer: 34 mL/min — ABNORMAL LOW (ref >60)
Glucose, Bld: 111 mg/dL — ABNORMAL HIGH (ref 70–99)
Potassium: 3.8 mmol/L (ref 3.5–5.1)
Sodium: 138 mmol/L (ref 135–145)

## 2018-10-14 LAB — VITAMIN B12: Vitamin B-12: 285 pg/mL (ref 180–914)

## 2018-10-14 LAB — MAGNESIUM: Magnesium: 1.9 mg/dL (ref 1.7–2.4)

## 2018-10-14 LAB — FERRITIN: Ferritin: 93 ng/mL (ref 11–307)

## 2018-10-14 MED ORDER — METOPROLOL TARTRATE 50 MG PO TABS
75.0000 mg | ORAL_TABLET | Freq: Two times a day (BID) | ORAL | Status: DC
  Administered 2018-10-14 – 2018-10-15 (×3): 75 mg via ORAL
  Filled 2018-10-14 (×3): qty 1

## 2018-10-14 MED ORDER — METOPROLOL SUCCINATE ER 50 MG PO TB24: 75.0000 mg | ORAL_TABLET | Freq: Every day | ORAL | Status: DC

## 2018-10-14 MED ORDER — HYDROXYZINE HCL 10 MG PO TABS
10.0000 mg | ORAL_TABLET | Freq: Three times a day (TID) | ORAL | Status: AC | PRN
  Administered 2018-10-15 – 2018-10-16 (×2): 10 mg via ORAL
  Filled 2018-10-14 (×2): qty 1

## 2018-10-14 MED ORDER — FUROSEMIDE 10 MG/ML IJ SOLN
40.0000 mg | Freq: Two times a day (BID) | INTRAMUSCULAR | Status: AC
  Administered 2018-10-14: 40 mg via INTRAVENOUS
  Filled 2018-10-14 (×2): qty 4

## 2018-10-14 MED ORDER — HYDROCODONE-ACETAMINOPHEN 5-325 MG PO TABS
1.0000 | ORAL_TABLET | Freq: Four times a day (QID) | ORAL | Status: DC | PRN
  Administered 2018-10-14 – 2018-10-20 (×14): 1 via ORAL
  Filled 2018-10-14 (×15): qty 1

## 2018-10-14 MED ORDER — FUROSEMIDE 10 MG/ML IJ SOLN
40.0000 mg | Freq: Once | INTRAMUSCULAR | Status: DC
  Administered 2018-10-14: 40 mg via INTRAVENOUS

## 2018-10-14 NOTE — Evaluation (Signed)
Physical Therapy Evaluation Patient Details Name: Teresa [name] MRN: [id number] DOB: [birth year] Today's Date: 10/14/2018   History of Present Illness  78 y.o. yo female admited 4/29 w/ PMH significant for afib, HFrEF non ischemic, hypothyroidism, anxiety.  Presents for extreme tiredness and shortness of breath on exertion. CXR reveals left pleural effusion. Treatment for CHF, Afib, pleural effusion    Clinical Impression  PTA pt living alone in single story home with level entry, pt reports community level ambulation with [facility] about a month ago but has had progressive weakness and fatigue with movement over the last 2 weeks. Pt reports LE giving out immediately prior to hospitalization. Pt is currently limited in safe mobility by DoE and increased weakness, balance and endurance. Pt currently requires modA for bed mobility, and min guard for transfers and ambulation of 18 feet with RW. Pt experiences 3/4 DoE with ambulation. PT recommends HHPT level rehab at discharge to improve strength, balance and endurance in her home environment. PT also recommends 24 hr assist from her family until pt endurance improves. PT will continue to follow acutely.    Follow Up Recommendations Home health PT;Supervision/Assistance - 24 hour    Equipment Recommendations  Rolling walker with 5" wheels;3in1 (PT)    Recommendations for Other Services       Precautions / Restrictions Precautions Precautions: Fall Precaution Comments: slid to the floor immediately prior to hospitalization Restrictions Weight Bearing Restrictions: No      Mobility  Bed Mobility Overal bed mobility: Needs Assistance Bed Mobility: Sit to Supine       Sit to supine: Mod assist   General bed mobility comments: modA for management of LE into bed  Transfers Overall transfer level: Needs assistance Equipment used: Rolling walker (2 wheeled) Transfers: Sit to/from Stand Sit to Stand: Min guard         General transfer  comment: min guard for safety, vc for hand placement for power up from [facility]  Ambulation/Gait Ambulation/Gait assistance: Min guard Gait Distance (Feet): 18 Feet Assistive device: Rolling walker (2 wheeled) Gait Pattern/deviations: Step-through pattern;Shuffle;Trunk flexed;Decreased step length - right;Decreased step length - left Gait velocity: slowed Gait velocity interpretation: <1.31 ft/sec, indicative of household ambulator General Gait Details: min guard for safety, slow, shuffling gait with increased effort and 3/4 DoE with progression, vc for upright posture and proximity to RW        Balance Overall balance assessment: Needs assistance Sitting-balance support: Feet supported;No upper extremity supported Sitting balance-Leahy Scale: Fair     Standing balance support: No upper extremity supported Standing balance-Leahy Scale: Fair Standing balance comment: requires UE support for dynamic balance                             Pertinent Vitals/Pain Pain Assessment: No/denies pain    Home Living Family/patient expects to be discharged to:: Private residence Living Arrangements: Alone Available Help at Discharge: Family;Available 24 hours/day Type of Home: House Home Access: Level entry     Home Layout: One level Home Equipment: Cane - single point;Grab bars - toilet;Toilet riser;Tub bench      Prior Function Level of Independence: Needs assistance   Gait / Transfers Assistance Needed: ambulation limited community distances with [facility], last two weeks limited to household ambulation with increased effort  ADL's / Homemaking Assistance Needed: has been taking bird baths for last month or so, son brings meals and groceries, still able to fix simple meals  Extremity/Trunk Assessment   Upper Extremity Assessment Upper Extremity Assessment: Defer to OT evaluation    Lower Extremity Assessment Lower Extremity Assessment: Generalized weakness     Cervical / Trunk Assessment Cervical / Trunk Assessment: Kyphotic  Communication   Communication: No difficulties  Cognition Arousal/Alertness: Awake/alert Behavior During Therapy: WFL for tasks assessed/performed Overall Cognitive Status: Within Functional Limits for tasks assessed                                        General Comments General comments (skin integrity, edema, etc.): on RA pt SaO2 dropped to 87%O2 with ambulation, at rest SaO2 95%O2, resting HR 99bpm with ambulation max HR 155bpm        Assessment/Plan    PT Assessment Patient needs continued PT services  PT Problem List Decreased strength;Decreased activity tolerance;Decreased balance;Decreased mobility;Decreased knowledge of use of DME;Cardiopulmonary status limiting activity       PT Treatment Interventions DME instruction;Gait training;Functional mobility training;Therapeutic activities;Therapeutic exercise;Balance training;Cognitive remediation;Patient/family education    PT Goals (Current goals can be found in the Care Plan section)  Acute Rehab PT Goals Patient Stated Goal: be able to move without fatigue PT Goal Formulation: With patient Time For Goal Achievement: 10/28/18 Potential to Achieve Goals: Fair    Frequency Min 3X/week    AM-PAC PT "6 Clicks" Mobility  Outcome Measure Help needed turning from your back to your side while in a flat bed without using bedrails?: None Help needed moving from lying on your back to sitting on the side of a flat bed without using bedrails?: A Little Help needed moving to and from a bed to a chair (including a wheelchair)?: None Help needed standing up from a chair using your arms (e.g., wheelchair or bedside chair)?: None Help needed to walk in hospital room?: A Little Help needed climbing 3-5 steps with a railing? : A Lot 6 Click Score: 20    End of Session   Activity Tolerance: Patient limited by fatigue Patient left: in bed;with call  bell/phone within reach Nurse Communication: Mobility status PT Visit Diagnosis: Unsteadiness on feet (R26.81);Other abnormalities of gait and mobility (R26.89);History of falling (Z91.81);Muscle weakness (generalized) (M62.81);Difficulty in walking, not elsewhere classified (R26.2)    Time: 0940-1005 PT Time Calculation (min) (ACUTE ONLY): 25 min   Charges:   PT Evaluation $PT Eval Moderate Complexity: 1 Mod PT Treatments $Gait Training: 8-22 mins        Lether Tesch B. Migdalia Dk PT, DPT Acute Rehabilitation Services Pager [phone] Office [phone]   [place] 10/14/2018, 10:24 AM

## 2018-10-14 NOTE — Progress Notes (Signed)
Occupational Therapy Evaluation Patient Details Name: Teresa [name] MRN: [id number] DOB: [birth year] Today's Date: 10/14/2018    History of Present Illness 78 y.o. yo female admited 4/29 w/ PMH significant for afib, HFrEF non ischemic, hypothyroidism, anxiety.  Presents for extreme tiredness and shortness of breath on exertion. CXR reveals left pleural effusion. Treatment for CHF, Afib, pleural effusion     Clinical Impression   Pt admitted with above diagnosis. PTA, pt is independent and lives alone. She has 24/7 assist available from family. Pt requires supervision-min guard for ADLs and min guard with RW for functional mobility. Pt limited by DoE and fatigue, reporting she feels weaker than her usual self. Recommending HHOT and 24/7 supervision/assist for d/c planning. Will continue to follow acutely in order to maximize safety and independence with ADLs.    Follow Up Recommendations  Supervision/Assistance - 24 hour;Home health OT    Equipment Recommendations  None recommended by OT    Recommendations for Other Services       Precautions / Restrictions Precautions Precautions: Fall Precaution Comments: slid to the floor immediately prior to hospitalization Restrictions Weight Bearing Restrictions: No      Mobility Bed Mobility Overal bed mobility: Needs Assistance Bed Mobility: Supine to Sit;Sit to Supine     Supine to sit: Min assist;HOB elevated Sit to supine: Min assist;HOB elevated   General bed mobility comments: Pt lifts her LEs back into bed using her hands.   Transfers Overall transfer level: Needs assistance Equipment used: Rolling walker (2 wheeled) Transfers: Sit to/from Stand Sit to Stand: Min guard         General transfer comment: Guarding for safety.    Balance Overall balance assessment: Needs assistance Sitting-balance support: Feet supported;No upper extremity supported Sitting balance-Leahy Scale: Fair     Standing balance support: No  upper extremity supported Standing balance-Leahy Scale: Fair Standing balance comment: requires UE support for dynamic balance                           ADL either performed or assessed with clinical judgement   ADL Overall ADL's : Needs assistance/impaired Eating/Feeding: Independent;Sitting   Grooming: Wash/dry hands;Wash/dry face;Brushing hair;Sitting;Supervision/safety   Upper Body Bathing: Supervision/ safety;Sitting   Lower Body Bathing: Min guard;Sit to/from stand   Upper Body Dressing : Supervision/safety;Sitting   Lower Body Dressing: Min guard;Sit to/from stand   Toilet Transfer: Min guard;RW   Toileting- Water quality scientist and Hygiene: Min guard;Sit to/from stand         General ADL Comments: Pt sitting EOB for ADLs. HR ranging from 90s-120s with sitting and with bed mobility. Took steps to [facility].     Vision         Perception     Praxis      Pertinent Vitals/Pain Pain Assessment: No/denies pain     Hand Dominance     Extremity/Trunk Assessment Upper Extremity Assessment Upper Extremity Assessment: Generalized weakness   Lower Extremity Assessment Lower Extremity Assessment: Defer to PT evaluation   Cervical / Trunk Assessment Cervical / Trunk Assessment: Kyphotic   Communication Communication Communication: HOH   Cognition Arousal/Alertness: Awake/alert Behavior During Therapy: WFL for tasks assessed/performed Overall Cognitive Status: Within Functional Limits for tasks assessed                                     General Comments  SpO2 on  RA >90% seated tasks.     Exercises     Shoulder Instructions      Home Living Family/patient expects to be discharged to:: Private residence Living Arrangements: Alone Available Help at Discharge: Family;Available 24 hours/day Type of Home: House Home Access: Level entry     Home Layout: One level     Bathroom Shower/Tub: Teacher, early years/pre:  Standard Bathroom Accessibility: Yes   Home Equipment: Cane - single point;Grab bars - toilet;Toilet riser;Tub bench;Bedside commode          Prior Functioning/Environment Level of Independence: Needs assistance  Gait / Transfers Assistance Needed: ambulation limited community distances with SPC, last two weeks limited to household ambulation with increased effort ADL's / Homemaking Assistance Needed: has been taking bird baths for last month or so, son brings meals and groceries, still able to fix simple meals   Comments: 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 Has a yorkie named "Sugar".        OT Problem List: Decreased strength;Decreased activity tolerance;Impaired balance (sitting and/or standing);Decreased knowledge of use of DME or AE;Decreased safety awareness      OT Treatment/Interventions: Self-care/ADL training;DME and/or AE instruction;Therapeutic activities;Patient/family education;Balance training    OT Goals(Current goals can be found in the care plan section) Acute Rehab OT Goals Patient Stated Goal: be able to move without fatigue OT Goal Formulation: With patient Time For Goal Achievement: 10/28/18 Potential to Achieve Goals: Good  OT Frequency: Min 2X/week   Barriers to D/C:            Co-evaluation              AM-PAC OT "6 Clicks" Daily Activity     Outcome Measure Help from another person eating meals?: None Help from another person taking care of personal grooming?: A Little Help from another person toileting, which includes using toliet, bedpan, or urinal?: A Little Help from another person bathing (including washing, rinsing, drying)?: A Little Help from another person to put on and taking off regular upper body clothing?: A Little Help from another person to put on and taking off regular lower body clothing?: A Little 6 Click Score: 19   End of Session Equipment Utilized During Treatment: Rolling walker Nurse Communication: (sit up EOB  or in recliner for meals)  Activity Tolerance: Patient tolerated treatment well Patient left: in bed;with call bell/phone within reach;with bed alarm set  OT Visit Diagnosis: Unsteadiness on feet (R26.81);History of falling (Z91.81);Muscle weakness (generalized) (M62.81)                Time: 4765-4650 OT Time Calculation (min): 23 min Charges:  OT General Charges $OT Visit: 1 Visit OT Evaluation $OT Eval Moderate Complexity: 1 Mod OT Treatments $Self Care/Home Management : 8-22 mins    Darrol Jump OTR/L [place] [phone] 10/14/2018, 12:34 PM

## 2018-10-14 NOTE — Progress Notes (Signed)
  Date: 10/14/2018  Patient name: Teresa [name]  Medical record number: [id number]  Date of birth: [birth year]   I have seen and evaluated this patient and I have discussed the plan of care with the house staff. Please see Dr. Aurelio Jew note for complete details. I concur with her findings and plan.   Will further diurese today and hopefully her HR will improve.  I think she was likely volume up at discharge last time (was inpatient for renal failure) and she did not receive lasix on discharge and was not following a low sodium diet.    Sid Falcon, MD 10/14/2018, 7:04 PM

## 2018-10-14 NOTE — Progress Notes (Signed)
Subjective:  She is feeling anxious this morning as she is short of breath after moving around from getting up to go to the bathroom. She feels like it's easier to breath when she is sitting up straight. She denies chest pain, cough, or nausea. She denies sensation of palpitations.   Objective:  Vital signs in last 24 hours: Vitals:   10/13/18 1947 10/13/18 2034 10/13/18 2322 10/14/18 0700  BP: 114/77  105/74 (!) 109/91  Pulse: 90 (!) 103 84 92  Resp:    18  Temp: 98.6 F (37 C)  (!) 97.4 F (36.3 C) 97.7 F (36.5 C)  TempSrc: Axillary  Oral Oral  SpO2: 98%  96% 100%  Weight:    98 kg  Height:       Constitution: mild distress, obese Cardio: tachycardic, irregular rhythm, no m/r/g  Respiratory: crackles left lower base, improved from yesterday, otherwise CTA  MSK: +2 pitting edema, moving all extremities, extremities warm  Neuro: alert, anxious affect  Skin: c/d/i   ECHO: 08/17/2018  1. The left ventricle has moderately reduced systolic function, with an ejection fraction of 35-40%. The cavity size was normal. There is mildly increased left ventricular wall thickness. Left ventricular diastology could not be evaluated secondary to  atrial fibrillation. Left ventricular diffuse hypokinesis. 2. The right ventricle has normal systolic function. The cavity was normal. There is no increase in right ventricular wall thickness. 3. Left atrial size was moderately dilated. 4. Right atrial size was mildly dilated. 5. The mitral valve is degenerative. Mild thickening of the mitral valve leaflet. 6. The aortic valve was not well visualized Mild calcification of the aortic valve. Aortic valve regurgitation is mild by color flow Doppler. The jet is posteriorly-directed no stenosis of the aortic valve. 7. The aortic root and ascending aorta are normal in size and structure  Assessment/Plan:  Active Problems:   Atrial fibrillation (HCC)   Acute on chronic congestive heart failure  (HCC)   AKI (acute kidney injury) (HCC)   Hypomagnesemia   Atrial Fibrillation Acute on Chronic Systolic Heart Failure Heart rate elevated to 115 after moving from bed to commode. 104 prior. She was taking metoprolol 50 mg bid prior to admission. Per last cardiology telehealth visit patient was on eliquis and per discharge summary she was sent home with this, but she was not taking this at home. HAS-BLED score 2 points and CHADsVASc 5 points. Her atrial fibrillation is likely secondary to HF exacerbation with volume overload. Crackles have improved on physical exam today.   - am chest x-ray - will start eliquis at discharge - switch to metoprolol succinate 75 mg bid to long acting - lasix 40 mg bid IV  - strict ins/outs - daily weights - exact baseline weight unclear, she may have been discharged prior to full diuresis 2/2 to receiving fluids.  - am CMP, CBC  - PT/OT  Depression Anxiety She has dx of anxiety and is quite anxious with her increased work of breathing.  - cont. celexa 20 mg qd and buspar 5 mg bid - cont. remeron 7.5 qhs prn - atarax 10 mg tid prn anxiety  Thrombocytopenia Transaminitis Thrombocytopenia and mild elevation in Alk phos and ALT likely secondary to her acute heart failure   - am CBC, CMP   Hypothyroidism - cont. Home synthroid  Hx PUD - cont. protonix 40 mg qd and pepcid 20 mg prn qhs  Dispo Patient will likely require SNF level care at the time of discharge.  COVID-19 testing is ordered in order to facilitate the discharge process.  It is my clinical opinion that this patient does not have any symptoms of fever, cough, shortness of breath, diarrhea or altered mental status related to COVID-19 disease.   Patient does not require any specific isolation while his COVID-19 test result is pending.     VTE: lovenox IVF: none Diet: Heart Healthy Code: full   Dispo: Anticipated discharge in 2-3 days.   Marty Heck, DO 10/14/2018, 7:20 AM  Pager: [phone]

## 2018-10-14 NOTE — Discharge Summary (Signed)
Name: Teresa [name] MRN: [id number] DOB: 1940-08-26 78 y.o. PCP: Teresa Guys, MD  Date of Admission: 10/13/2018  6:38 AM Date of Discharge:  Attending Physician: Teresa Crews, MD  Discharge Diagnosis: 1. Atrial Fibrillation with RVR 2. AKI on CKD Stage III 3. Low Output Heart Failure  4. Moderate-Severe Mitral Regurge  Discharge Medications: Allergies as of 10/20/2018      Reactions   Aspirin Other (See Comments)   HX Bleeding Ulcer    Nsaids Other (See Comments)   Hx of bleeding ulcers   Tolmetin Rash   Hx of bleeding ulcers      Medication List    STOP taking these medications   atorvastatin 10 MG tablet Commonly known as:  LIPITOR   calcium carbonate 500 MG chewable tablet Commonly known as:  TUMS - dosed in mg elemental calcium   famotidine 20 MG tablet Commonly known as:  PEPCID   furosemide 20 MG tablet Commonly known as:  LASIX   omeprazole 20 MG tablet Commonly known as:  PRILOSEC OTC   pantoprazole 40 MG tablet Commonly known as:  PROTONIX   PRO-BIOTIC BLEND PO     TAKE these medications   AeroChamber MV inhaler Use as instructed   albuterol 108 (90 Base) MCG/ACT inhaler Commonly known as:  ProAir HFA USE 2 PUFFS EVERY 6 HOURS AS NEEDED FOR SHORTNESS OF BREATH AND WHEEZING.   amiodarone 200 MG tablet Commonly known as:  PACERONE Take 1 tablet (200 mg total) by mouth 2 (two) times daily.   apixaban 5 MG Tabs tablet Commonly known as:  ELIQUIS Take 1 tablet (5 mg total) by mouth 2 (two) times daily.   busPIRone 5 MG tablet Commonly known as:  BUSPAR Take 1 tablet (5 mg total) by mouth 2 (two) times daily.   citalopram 20 MG tablet Commonly known as:  CELEXA TAKE 1 TABLET EACH DAY. What changed:  See the new instructions.   HYDROcodone-acetaminophen 5-325 MG tablet Commonly known as:  NORCO/VICODIN Take 1 tablet by mouth 5 (five) times daily.   hydrOXYzine 10 MG tablet Commonly known as:  ATARAX/VISTARIL Take 1 tablet  (10 mg total) by mouth 2 (two) times daily as needed for anxiety.   levothyroxine 75 MCG tablet Commonly known as:  SYNTHROID TAKE 1 TABLET ONCE DAILY BEFORE BREAKFAST. What changed:    how much to take  how to take this  when to take this  additional instructions   metoprolol succinate 100 MG 24 hr tablet Commonly known as:  TOPROL-XL Take 1 tablet (100 mg total) by mouth daily. Take with or immediately following a meal. Start taking on:  Oct 21, 2018 What changed:    medication strength  how much to take  when to take this   mirtazapine 7.5 MG tablet Commonly known as:  REMERON TAKE ONE TABLET AT BEDTIME.   morphine 20 MG/5ML solution Take 0.6 mLs (2.4 mg total) by mouth every 4 (four) hours as needed for pain.   torsemide 20 MG tablet Commonly known as:  DEMADEX Take 2 tablets (40 mg total) by mouth daily.            Durable Medical Equipment  (From admission, onward)         Start     Ordered   10/15/18 1642  For home use only DME 3 n 1  Once     10/15/18 1641   10/15/18 1640  For home use only DME Walker rolling  Once    Comments:  5 wheel rolling walker  Question:  Patient needs a walker to treat with the following condition  Answer:  Dyspnea on exertion   10/15/18 1641          Disposition and follow-up:   [name] was discharged from [facility] in Stable condition.  At the hospital follow up visit please address:  1.  Atrial Fibrillation with RVR: discharged with amiodarone 200 mg bid, toprol 100 mg qd. Continues to have bouts of afib with RVR when talking and moving. Has f/u with Dr. Rayann [name]  2-3 days after discharge for telehealth visit and recommend keeping this appointment for potential cardioversion if symptoms allow although she may not be medically stable for this. Discussion was had concerning risk/benefit of eliquis, and she decided to continue this at discharge. Rx sent.  AKI on CKD Stage III: worsening 2/2 low  output heart failure with decreased Uop and fluid overload despite diuretics. Discharged with torsemide 40 mg qd for symptom management of fluid overload Hospice: After multiple long discussion concerning goals of care she would like to go home with hospice and symptom control. I think there is possible further conversation concerning goals of care as her main focus is not being in the hospital. Discharged with morphine 2.5 mg q4h prn pain or severe dyspnea and atarax 10 mg q12h prn anxiety as this worked for her in the inpatient setting.   2.  Labs / imaging needed at time of follow-up: none  3.  Pending labs/ test needing follow-up: none  Follow-up Appointments: Follow-up Information    [facility] Follow up.   Why:  Registered Nurse, Physical Therapy Office to call with visit times.        [facility] Follow up.   Why:  [facility] Course by problem list: Atrial Fibrillation with RVR:  Acute on Chronic Systolic Heart Failure AKI on CKD Stage III Teresa [name] is a 78 yo female with PMH of HFrEF (ER 35%), moderate-severe MVR, atrial fibrillation, hypothyroidism, and anxiety who presented with SOB on exertion found to be in atrial fibrillation with RVR and with volume overload with interstitial edema and pleural effusion on x-ray.She had recently been admitted one month prior for acute renal failure and new onset afib. She was intended to start Eliquis at discharge but this had not been started. In the ED she was started on diltiazem and admitted to the IMT[facility] for diuresis. Baseline weight is unclear. RVR resolved and she was transitioned back to metoprolol with increase in dose and subsequently returned to RVR.  Cardiology and EP were consulted. She was initially increased to metoprolol 50mg  qid and continued to remain in RVR. Amiodarone 200 mg bid and toprol xl 100 mg qd were added with slight decrease in rate. EP planned follow-up with her for possible  cardioversion in 2-3 weeks. Diuresis was continued although she continued to have limited urine output and worsening renal function. Bladder scan showed normal post-void urination. She developed low output heart failure and worsening transaminitis with decreased organ perfusion. Amiodarone was titrated up to 400 mg bid to control her atrial fibrillation with mild improvement in heart rate at rest, although she continued to increase to the 130s when talking.  Throughout hospital stay she continued to insist that she wanted to go home but remained for therapy. On hospital day 7 she stated that she was ready to  go home regardless of medical therapy. Multiple long discussions were had concerning goals of care and that there were still possible options for her heart failure and atrial fibrillation but that because of her continued worsening symptoms she would need to remain inpatient for therapy and titration of medications and organ function was worsening. She stated that she wanted to transition to hospice and to go home and be with her family. Discussion was had with her son as well who visits her daily at her home and will help with transition. After discussion with cardiology, she will be discharged with amiodarone 200 mg bid and toprol 100 mg qd for rate control. Additionally prescribed torsemide 40 mg qd for diuresis. She was discharged home with hospice appointment later today.    Discharge Vitals:   BP (!) 121/46   Pulse (!) 110   Temp (!) 97.4 F (36.3 C) (Axillary)   Resp 16   Ht 5\' 3"  (1.6 m)   Wt 94 kg   SpO2 99%   BMI 36.72 kg/m   Pertinent Labs, Studies, and Procedures:   CXR 10/13/2018 IMPRESSION: 1. Cardiomegaly with new left pleural effusion and pulmonary vascular congestion concerning for congestive heart failure. 2. Left greater than right basilar airspace disease. While this likely represents atelectasis, infection is not excluded. Electronically Signed   By: San Morelle M.D.   On: 10/13/2018 07:30  Discharge Instructions: Discharge Instructions    Diet - low sodium heart healthy   Complete by:  As directed    Discharge instructions   Complete by:  As directed    You were hospitalized for Atrial Fibrillation with Rapid Ventricular Rate and Heart Failure. Thank you for allowing [place] to be part of your care.   Please follow-up with your cardiologist, Dr. Percival Spanish to discuss further medication management going forward after discussing further goals of care.   Please note these changes made to your medications:   Please STOP metoprolol 75 mg tablet BID  Please START:  - apixaban (ELIQUIS) 5 MG tablet every 12 hours - torsemide (DEMADEX) 20 MG tablet - take two tablets once per day in the morning  - metoprolol succinate (TOPROL-XL) 100 MG 24 hr tablet - take one tablet by mouth daily - amiodarone (PACERONE) 200 MG - take one tablet every 12 hours  - hydroxyzine (ATARAX/VISTARIL) 10 MG tablet every 12 hours as needed for anxiety - morphine 20 MG/5ML solution - take .6 mL every four hours as needed for severe pain   Increase activity slowly   Complete by:  As directed       Signed: Molli Hazard A, DO 10/20/2018, 12:31 PM   Pager: [phone]

## 2018-10-15 ENCOUNTER — Inpatient Hospital Stay (HOSPITAL_COMMUNITY): Payer: Medicare Other

## 2018-10-15 DIAGNOSIS — I4891 Unspecified atrial fibrillation: Secondary | ICD-10-CM

## 2018-10-15 DIAGNOSIS — R740 Nonspecific elevation of levels of transaminase and lactic acid dehydrogenase [LDH]: Secondary | ICD-10-CM

## 2018-10-15 DIAGNOSIS — N182 Chronic kidney disease, stage 2 (mild): Secondary | ICD-10-CM

## 2018-10-15 DIAGNOSIS — R748 Abnormal levels of other serum enzymes: Secondary | ICD-10-CM | POA: Diagnosis present

## 2018-10-15 DIAGNOSIS — I5023 Acute on chronic systolic (congestive) heart failure: Secondary | ICD-10-CM

## 2018-10-15 DIAGNOSIS — D696 Thrombocytopenia, unspecified: Secondary | ICD-10-CM | POA: Diagnosis present

## 2018-10-15 LAB — CBC
HCT: 36.3 % (ref 36.0–46.0)
Hemoglobin: 11.5 g/dL — ABNORMAL LOW (ref 12.0–15.0)
MCH: 32.2 pg (ref 26.0–34.0)
MCHC: 31.7 g/dL (ref 30.0–36.0)
MCV: 101.7 fL — ABNORMAL HIGH (ref 80.0–100.0)
Platelets: 120 10*3/uL — ABNORMAL LOW (ref 150–400)
RBC: 3.57 MIL/uL — ABNORMAL LOW (ref 3.87–5.11)
RDW: 15.1 % (ref 11.5–15.5)
WBC: 7.5 10*3/uL (ref 4.0–10.5)
nRBC: 0 % (ref 0.0–0.2)

## 2018-10-15 LAB — COMPREHENSIVE METABOLIC PANEL
ALT: 50 U/L — ABNORMAL HIGH (ref 0–44)
AST: 35 U/L (ref 15–41)
Albumin: 3 g/dL — ABNORMAL LOW (ref 3.5–5.0)
Alkaline Phosphatase: 125 U/L (ref 38–126)
Anion gap: 13 (ref 5–15)
BUN: 27 mg/dL — ABNORMAL HIGH (ref 8–23)
CO2: 22 mmol/L (ref 22–32)
Calcium: 8.2 mg/dL — ABNORMAL LOW (ref 8.9–10.3)
Chloride: 103 mmol/L (ref 98–111)
Creatinine, Ser: 1.5 mg/dL — ABNORMAL HIGH (ref 0.44–1.00)
GFR calc Af Amer: 39 mL/min — ABNORMAL LOW (ref >60)
GFR calc non Af Amer: 33 mL/min — ABNORMAL LOW (ref >60)
Glucose, Bld: 106 mg/dL — ABNORMAL HIGH (ref 70–99)
Potassium: 3.8 mmol/L (ref 3.5–5.1)
Sodium: 138 mmol/L (ref 135–145)
Total Bilirubin: 1.4 mg/dL — ABNORMAL HIGH (ref 0.3–1.2)
Total Protein: 5 g/dL — ABNORMAL LOW (ref 6.5–8.1)

## 2018-10-15 LAB — FOLATE RBC
Folate, Hemolysate: 408 ng/mL
Folate, RBC: 1079 ng/mL (ref >498)
Hematocrit: 37.8 % (ref 34.0–46.6)

## 2018-10-15 LAB — NOVEL CORONAVIRUS, NAA (HOSP ORDER, SEND-OUT TO REF LAB; TAT 18-24 HRS): SARS-CoV-2, NAA: NOT DETECTED

## 2018-10-15 MED ORDER — APIXABAN 2.5 MG PO TABS
2.5000 mg | ORAL_TABLET | Freq: Two times a day (BID) | ORAL | Status: DC
  Administered 2018-10-15: 2.5 mg via ORAL
  Filled 2018-10-15: qty 1

## 2018-10-15 MED ORDER — METOPROLOL TARTRATE 50 MG PO TABS
50.0000 mg | ORAL_TABLET | Freq: Four times a day (QID) | ORAL | Status: DC
  Administered 2018-10-15 – 2018-10-17 (×7): 50 mg via ORAL
  Filled 2018-10-15 (×7): qty 1

## 2018-10-15 MED ORDER — FUROSEMIDE 10 MG/ML IJ SOLN
40.0000 mg | Freq: Once | INTRAMUSCULAR | Status: AC
  Administered 2018-10-15: 14:00:00 40 mg via INTRAVENOUS
  Filled 2018-10-15: qty 4

## 2018-10-15 MED ORDER — APIXABAN 2.5 MG PO TABS
2.5000 mg | ORAL_TABLET | Freq: Once | ORAL | Status: AC
  Administered 2018-10-15: 14:00:00 2.5 mg via ORAL
  Filled 2018-10-15: qty 1

## 2018-10-15 MED ORDER — APIXABAN 5 MG PO TABS
5.0000 mg | ORAL_TABLET | Freq: Two times a day (BID) | ORAL | Status: DC
  Administered 2018-10-15 – 2018-10-20 (×10): 5 mg via ORAL
  Filled 2018-10-15 (×10): qty 1

## 2018-10-15 NOTE — Progress Notes (Signed)
Physical Therapy Treatment Patient Details Name: Teresa [name] MRN: [id number] DOB: [birth year] Today's Date: 10/15/2018    History of Present Illness 78 y.o. yo female admited 4/29 w/ PMH significant for afib, HFrEF non ischemic, hypothyroidism, anxiety.  Presents for extreme tiredness and shortness of breath on exertion. CXR reveals left pleural effusion. Treatment for CHF, Afib, pleural effusion      PT Comments    Pt pleasant and eager to get OOB today. Pt with improved gait with ability to walk 40' x 2 trials and add HEP to session today. Pt continues to be limited by fatigue with HR 110-140 with activity and SpO2 88-92% on  RA during gait. Pt states family will be able to assist and spend the night as needed and educated for need for 24hr assist as well as increased activity and encouragement to mobilize with nursing. Will continue to follow.    Follow Up Recommendations  Home health PT;Supervision/Assistance - 24 hour     Equipment Recommendations  Rolling walker with 5" wheels;3in1 (PT)    Recommendations for Other Services       Precautions / Restrictions Precautions Precautions: Fall    Mobility  Bed Mobility Overal bed mobility: Needs Assistance Bed Mobility: Supine to Sit     Supine to sit: HOB elevated;Min guard     General bed mobility comments: pt able to sit with assist of rail and pivot to EOB with increased time  Transfers Overall transfer level: Needs assistance   Transfers: Sit to/from Stand Sit to Stand: Min guard         General transfer comment: cues for hand placement and safety x 2 trials from bed and chair  Ambulation/Gait Ambulation/Gait assistance: Min guard Gait Distance (Feet): 40 Feet Assistive device: Rolling walker (2 wheeled) Gait Pattern/deviations: Step-through pattern;Decreased stride length;Trunk flexed   Gait velocity interpretation: <1.8 ft/sec, indicate of risk for recurrent falls General Gait Details: cues for posture and  position in RW with chair follow. Pt able to walk 40' x 2 with chair follow limited by fatigue   Stairs             Wheelchair Mobility    Modified Rankin (Stroke Patients Only)       Balance Overall balance assessment: Needs assistance Sitting-balance support: Feet supported;No upper extremity supported Sitting balance-Leahy Scale: Fair     Standing balance support: Bilateral upper extremity supported Standing balance-Leahy Scale: Poor Standing balance comment: requires UE support for dynamic balance                            Cognition Arousal/Alertness: Awake/alert Behavior During Therapy: WFL for tasks assessed/performed Overall Cognitive Status: Within Functional Limits for tasks assessed                                        Exercises General Exercises - Lower Extremity Long Arc Quad: AROM;15 reps;Seated;Both Hip ABduction/ADduction: AROM;15 reps;Seated;Both Hip Flexion/Marching: AROM;10 reps;Seated;Both Toe Raises: AROM;15 reps;Seated;Both Heel Raises: AROM;15 reps;Seated;Both    General Comments        Pertinent Vitals/Pain Pain Assessment: No/denies pain    Home Living                      Prior Function            PT Goals (current goals can  now be found in the care plan section) Progress towards PT goals: Progressing toward goals    Frequency           PT Plan Current plan remains appropriate    Co-evaluation              AM-PAC PT "6 Clicks" Mobility   Outcome Measure  Help needed turning from your back to your side while in a flat bed without using bedrails?: None Help needed moving from lying on your back to sitting on the side of a flat bed without using bedrails?: A Little Help needed moving to and from a bed to a chair (including a wheelchair)?: A Little Help needed standing up from a chair using your arms (e.g., wheelchair or bedside chair)?: A Little Help needed to walk in  hospital room?: A Little Help needed climbing 3-5 steps with a railing? : A Lot 6 Click Score: 18    End of Session Equipment Utilized During Treatment: Gait belt Activity Tolerance: Patient limited by fatigue Patient left: in chair;with call bell/phone within reach;with chair alarm set Nurse Communication: Mobility status PT Visit Diagnosis: Unsteadiness on feet (R26.81);Other abnormalities of gait and mobility (R26.89);History of falling (Z91.81);Muscle weakness (generalized) (M62.81);Difficulty in walking, not elsewhere classified (R26.2)     Time: 3838-1840 PT Time Calculation (min) (ACUTE ONLY): 18 min  Charges:  $Gait Training: 8-22 mins                     Razia Screws Pam Drown, PT Acute Rehabilitation Services Pager: [phone] Office: [place] 10/15/2018, 1:26 PM

## 2018-10-15 NOTE — TOC Initial Note (Signed)
Transition of Care [facility]) - Initial/Assessment Note    Patient Details  Name: Teresa [name] MRN: [id number] Date of Birth: 1941/06/09  [facility] CM/SW Contact:    Bethena Roys, RN Phone Number: 10/15/2018, 4:23 PM  Clinical Narrative: Pt presented for Atrial Fib- PTA from home alone and has support of son. Patient has used [facility] in the past and wants to use again. Referral sent to [facility] with [facility] to begin within 24-48 hours post transition home. DME needed RW- referral sent to [facility] with [employer]. DME should be delivered to room once stable for transition home. No further needs from CM at this time.                 Expected Discharge Plan: [place] Barriers to Discharge: Continued Medical Work up   Patient Goals and CMS Choice Patient states their goals for this hospitalization and ongoing recovery are:: to get home CMS Medicare.gov Compare Post Acute Care list provided to:: (Pt did not need list- has used [facility] in the past and wanted them) Choice offered to / list presented to : Patient  Expected Discharge Plan and Services Expected Discharge Plan: [place] In-house Referral: NA Discharge Planning Services: CM Consult Post Acute Care Choice: Durable Medical Equipment, Home Health Living arrangements for the past 2 months: Single Family Home                 DME Arranged: Walker rolling DME Agency: [employer] Date DME Agency Contacted: 10/15/18 Time DME Agency Contacted: 54 Representative spoke with at DME Agency: [place] Arranged: RN, Disease Management, PT [place] Agency: [place] ([place]) Date HH Agency Contacted: 10/15/18 Time HH Agency Contacted: 1600 Representative spoke with at [place] Arrangements/Services Living arrangements for the past 2 months: [place] with:: Self(Has son that supports her and a friend Corporate treasurer checks in on her. ) Patient language and need  for interpreter reviewed:: Yes Do you feel safe going back to the place where you live?: Yes      Need for Family Participation in Patient Care: Yes (Comment) Care giver support system in place?: Yes (comment) Current home services: DME(Pt has DME 3n1) Criminal Activity/Legal Involvement Pertinent to Current Situation/Hospitalization: No - Comment as needed  Activities of Daily Living Home Assistive Devices/Equipment: Cane (specify quad or straight), Dentures (specify type) ADL Screening (condition at time of admission) Patient's cognitive ability adequate to safely complete daily activities?: Yes Is the patient deaf or have difficulty hearing?: Yes Does the patient have difficulty seeing, even when wearing glasses/contacts?: No Does the patient have difficulty concentrating, remembering, or making decisions?: No Patient able to express need for assistance with ADLs?: Yes Does the patient have difficulty dressing or bathing?: No Independently performs ADLs?: Yes (appropriate for developmental age) Does the patient have difficulty walking or climbing stairs?: Yes Weakness of Legs: Both Weakness of Arms/Hands: None  Permission Sought/Granted Permission sought to share information with : Family Supports                Emotional Assessment Appearance:: Appears stated age   Affect (typically observed): Accepting Orientation: : Oriented to Situation, Oriented to Self, Oriented to Place, Oriented to  Time Alcohol / Substance Use: Not Applicable Psych Involvement: No (comment)  Admission diagnosis:  Hypomagnesemia [E83.42] AKI (acute kidney injury) ([place]) [N17.9] Atrial fibrillation with RVR (HCC) [I48.91] Acute on chronic congestive heart failure, unspecified heart failure  type [facility]) [I50.9] Patient Active Problem List   Diagnosis Date Noted  . Transaminitis 10/15/2018  . Thrombocytopenia ([place]) 10/15/2018  . Atrial fibrillation ([place]) 10/13/2018  . Acute on chronic congestive  heart failure ([place])   . AKI (acute kidney injury) ([place])   . Hypomagnesemia   . Pressure injury of skin 08/12/2018  . Acute renal failure (ARF) ([place]) 08/10/2018  . Hypotension due to hypovolemia 08/10/2018  . Hypokalemia 08/10/2018  . Hyponatremia 08/10/2018  . Metabolic acidosis, increased anion gap 08/10/2018  . Hypocalcemia 08/10/2018  . Elevated troponin 08/10/2018  . Prolonged QT interval 08/10/2018  . Atrial fibrillation with RVR ([place]) 08/10/2018  . Pancreatic pseudocyst 11/09/2017  . Polymyalgia rheumatica ([place]) 10/12/2016  . Anxiety and depression 10/12/2016  . Bilateral hip pain 06/24/2016  . Chronic pain of both shoulders 06/24/2016  . Psychophysiological insomnia 05/12/2016  . Spinal stenosis   . History of glaucoma   . History of small bowel obstruction   . Peptic ulcer disease   . Moderate mitral regurgitation   . Moderate tricuspid regurgitation   . H/O hiatal hernia   . Urge urinary incontinence   . Left ovarian cyst   . Diabetes mellitus type 2, diet-controlled ([place])   . Shortness of breath   . Wears dentures   . Hypothyroidism   . Generalized anxiety disorder 09/15/2013  . Hernia, incisional 05/10/2012  . Abdominal wall hernia 02/10/2012  . Dyslipidemia 05/01/2011  . GERD (gastroesophageal reflux disease) 05/01/2011  . Osteoarthritis 05/01/2011  . Chronic renal insufficiency 05/01/2011  . HLD (hyperlipidemia) 05/01/2011  . SBO (small bowel obstruction), s/p expl lap 04/24/2011  . Nonischemic cardiomyopathy ([place])   . Arthritis of ankle or foot, degenerative 07/12/2010  . Arthritis of hand, degenerative 07/12/2010  . Malaise and fatigue 07/12/2010  . Essential (primary) hypertension 07/12/2010  . Glaucoma 07/12/2010   PCP:  [name], MD Pharmacy:   [place], [place] [address] Phone: [phone] Fax: [phone]     Social Determinants of Health (SDOH)  Interventions    Readmission Risk Interventions Readmission Risk Prevention Plan 10/15/2018  Transportation Screening Complete  HRI or Home Care Consult Complete  Palliative Care Screening Not Applicable  Medication Review (RN Care Manager) Complete  Some recent data might be hidden

## 2018-10-15 NOTE — Progress Notes (Signed)
Occupational Therapy Treatment Patient Details Name: Teresa [name] MRN: [id number] DOB: October 08, 1940 Today's Date: 10/15/2018    History of present illness 78 y.o. yo female admited 4/29 w/ PMH significant for afib, HFrEF non ischemic, hypothyroidism, anxiety.  Presents for extreme tiredness and shortness of breath on exertion. CXR reveals left pleural effusion. Treatment for CHF, Afib, pleural effusion     OT comments  Pt less eager to work with therapy this afternoon, stating "you all are trying to make me do too many activities."  Session limited due to pt report of fatigue and refusing to participate in any more tasks.  Continue to recommend [place] and 24/7 supervision/assist. Will continue to follow acutely.  Follow Up Recommendations  Supervision/Assistance - 24 hour;Home health OT    Equipment Recommendations  None recommended by OT    Recommendations for Other Services      Precautions / Restrictions Precautions Precautions: Fall Precaution Comments: slid to the floor immediately prior to hospitalization       Mobility Bed Mobility Overal bed mobility: Needs Assistance Bed Mobility: Supine to Sit;Sit to Supine     Supine to sit: Min guard;HOB elevated Sit to supine: Min guard;HOB elevated   General bed mobility comments: pt able to sit with assist of rail and pivot to EOB with increased time  Transfers Overall transfer level: Needs assistance Equipment used: Rolling walker (2 wheeled) Transfers: Sit to/from [facility] Sit to Stand: Min guard Stand pivot transfers: Min guard       General transfer comment: guarding for safety    Balance                           ADL either performed or assessed with clinical judgement   ADL Overall ADL's : Needs assistance/impaired                         Toilet Transfer: Min guard;RW   Toileting- Clothing Manipulation and Hygiene: Min guard;Sit to/from stand         General ADL  Comments: HR ranging from 90-120s.      Vision       Perception     Praxis      Cognition Arousal/Alertness: Awake/alert Behavior During Therapy: WFL for tasks assessed/performed Overall Cognitive Status: Within Functional Limits for tasks assessed                                          Exercises   Shoulder Instructions       General Comments      Pertinent Vitals/ Pain       Pain Assessment: No/denies pain  Home Living                                          Prior Functioning/Environment              Frequency  Min 2X/week        Progress Toward Goals  OT Goals(current goals can now be found in the care plan section)  Progress towards OT goals: Progressing toward goals  Acute Rehab OT Goals Patient Stated Goal: be able to move without fatigue OT Goal Formulation: With patient Time For Goal Achievement: 10/28/18  Potential to Achieve Goals: Good ADL Goals Pt Will Perform Grooming: with supervision;sitting;standing Pt Will Perform Lower Body Dressing: with supervision;sit to/from stand;with adaptive equipment Pt Will Transfer to Toilet: with supervision;ambulating;bedside commode Pt Will Perform Toileting - Clothing Manipulation and hygiene: with supervision;sit to/from stand Additional ADL Goal #1: Pt will be able to independently identify and implement at least 3 energy conservation strategies during ADLs.  Plan Discharge plan remains appropriate    Co-evaluation                 AM-PAC OT "6 Clicks" Daily Activity     Outcome Measure   Help from another person eating meals?: None Help from another person taking care of personal grooming?: A Little Help from another person toileting, which includes using toliet, bedpan, or urinal?: A Little Help from another person bathing (including washing, rinsing, drying)?: A Little Help from another person to put on and taking off regular upper body clothing?: A  Little Help from another person to put on and taking off regular lower body clothing?: A Little 6 Click Score: 19    End of Session Equipment Utilized During Treatment: Rolling walker  OT Visit Diagnosis: Unsteadiness on feet (R26.81);History of falling (Z91.81);Muscle weakness (generalized) (M62.81)   Activity Tolerance Patient limited by fatigue   Patient Left in bed;with call bell/phone within reach;with bed alarm set   Nurse Communication Mobility status        Time: 2671-2458 OT Time Calculation (min): 11 min  Charges: OT General Charges $OT Visit: 1 Visit OT Treatments $Self Care/Home Management : 8-22 mins     Darrol Jump OTR/L [place] [phone] 10/15/2018, 3:29 PM

## 2018-10-15 NOTE — Progress Notes (Signed)
  Date: 10/15/2018  Patient name: Teresa [name]  Medical record number: [id number]  Date of birth: [birth year]   I have seen and evaluated this patient and I have discussed the plan of care with the house staff. Please see Dr. Aurelio Jew note for complete details. I concur with her findings and plan.  Cardiology consulted and reviewed their consultation note, appreciate recommendations.     Sid Falcon, MD 10/15/2018, 4:23 PM

## 2018-10-15 NOTE — Care Management Important Message (Signed)
Important Message  Patient Details  Name: Teresa [name] MRN: [id number] Date of Birth: 1940-08-16   Medicare Important Message Given:  Yes    [name] 10/15/2018, 3:36 PM

## 2018-10-15 NOTE — Discharge Instructions (Signed)

## 2018-10-15 NOTE — Progress Notes (Signed)
   Subjective: She is feeling well this morning and thinks her breathing is better. She is urinating well and is able to use bedside commode without difficulty. Discussed plan to consult cardiology to start another medication to help with her heart rate. She is interested in going home. Discussed possible discharge tomorrow with HH.   Objective:  Vital signs in last 24 hours: Vitals:   10/14/18 2042 10/14/18 2210 10/14/18 2211 10/15/18 0600  BP:  (!) 123/59 (!) 123/59 103/80  Pulse: (!) 128 82 (!) 119 (!) 108  Resp:      Temp:      TempSrc:      SpO2: 96% 97%  98%  Weight:      Height:       Constitution: NAD, supine in bed Cardio: irregular rate and rhythm, no m/r/g  Respiratory: CTA, no w/r/r MSK: +1 pitting edema, moving all extremities Neuro: a&o, normal affect Skin: dry, flaking skin  Assessment/Plan:  Active Problems:   Atrial fibrillation (HCC)   Acute on chronic congestive heart failure (HCC)   AKI (acute kidney injury) ([place])   Hypomagnesemia  78 yo female w/ PMH significant for afib, HFrEF non ischemic, hypothyroidism, anxiety presenting with afib in RVR and dyspnea. HAS-BLED score 2 points and CHADsVASc 5 points.   Atrial Fibrillation Acute on Chronic Systolic Heart Failure  She is feeling much better today although HR continues to be persistently elevated 105-120s despite metoprolol 75mg  bid. She is diuresing albeit slowly, exact baseline weight unclear, cxr is improved from admission and shows no interstitial edema, reduced pleural effusion. Uop net negative although some missed by purewick so this is not completely accurate. She likely will need to be started on amiodarone as well but will consult with cardiology as she sees Dr. Percival Spanish with [facility].   - consult cardiology  - start eliquis 2.5 mg bid - will increase to 5 mg bid when Cr <1.5 - cont. Lasix 40 mg bid - cont. Metoprolol tartrate 75 mg bid - strict in/out - daily weights  - PT/OT - recommending home  health with 3in1 and 5 wheel rolling walker - am BMP  AKI on CKD Stage II  2/2 to volume overload. Improve since admission but currently stable. Possible new baseline although will continue to diurese  - am BMP - cont diuresis  Thrombocytopenia Transaminitis  2/2 Acute heart failure. Improving.   - am CBC, BMP   Depession Anxiety - cont. celexa 20 mg qd, buspar 5 mg bid - cont. remeron 7.5 qhs prn  - atarax 10 mg tid prn anxiety    VTE: Apixaban  IVF: none  Diet: heart health Code: full   Dispo: Anticipated discharge in approximately 1-2 days.   Marty Heck, DO 10/15/2018, 6:26 AM Pager: [phone]

## 2018-10-15 NOTE — Consult Note (Addendum)
Cardiology Consult    Patient ID: Teresa [name] MRN: [id number], DOB/AGE: Apr 18, 1941   Admit date: 10/13/2018 Date of Consult: 10/15/2018  Primary Physician: [name], MD Primary Cardiologist: Minus Breeding, MD Requesting Provider: Gilles Chiquito, MD  Patient Profile    Teresa [name] is a 78 y.o. female with a history of chronic systolic CHF/non-ischemic cardiomyopathy, atrial fibrillation on Eliquis s/p DCCV with conversion to normal sinus rhythm in 08/2018, moderate mitral regurgitation, hypertension, type 2 diabetes mellitus, hypothyroidism, anxiety, and depression, who is being seen today for the evaluation of atrial fibrillation with RVR at the request of Dr. Daryll Drown.  History of Present Illness    Teresa [name] is a 78 year old female with the above history who is followed by Dr. Percival Spanish. Patient was admitted from 08/10/2018 to 08/17/2018 for an acute GI illness. During hospitalization, she developed new onset atrial fibrillation and was found to have a drop in LV function with EF of 35-40%. She was seen by Cardiology and underwent successful TEE/DCCV. At discharge, Losartan was discontinued she was restarted on low dose Lasix in addition to Toprol for her atrial fibrillation. Patient last saw Jory Sims, NP, for follow-up via telehealth visit at which time was doing relatively well from a cardiac standpoint.   Patient presented to the ED on 10/13/2018 via EMS for evaluation of weakness and shortness of breath. Patient states she has had gradually worsening weakness over the last 6 months which she has attributed partly to her age. However, this morning she got up around 4:00am to use the restroom. On the way back to her bed, she states her legs just gave out and she "slumped" to the floor. She did not hit her head on anything but does reports hitting her shoulder on the door handle on the way down. Patient also reports dyspnea on exertion for a while now. She states she does not have  any shortness of breath walking in her house or walking from her car into a store but notes shortness of breath when going on longer walks. Son helps management patient's medications and makes sure she takes everything. Patient has PO Lasix as needed but states she has not been taking it because she doesn't know when she really needs it. The admitting physician spoke with patient's sone who felt like patient "never really bounced back from her last admission." Son feels like patient returned to atrial fibrillation shortly after cardioversion. Son also expressed concern to the admitting physician about patient's progressive weakness and said she sometimes needs help getting up. Son told ED provider that patient has not been able to walk more than a few feet without having to stop.  She denies any orthopnea or PND. She has not noticed any lower extremity edema but has been told here in the hospital that her legs are swollen. Patient denies any chest pain or palpitations. She reports she rarely has any lightheadedness/dizziness and denies any syncope. No fevers, chills, body aches, cough, or known exposure to the coronavirus.   In the ED, patient tachycardic. EKG showed atrial fibrillation with RVR (rate 117 bpm). Troponin borderline positive at 0.03. Chest x-ray showed cardiomegaly with new left pleural effusion and pulmonary vascular congestion as well as bilateral basilar disease (left > right) that likely represents atelectasis. BNP elevated at 1,262.8. WBC 5.8, Hgb 12.5, Plts 122. Na 139, K 3.7, Mg 1.5, CO2 16, Glucose 132, SCr 1.57. TSH normal. Patient was admitted for further evaluation. COVID-19 testing came back  negative.  At the time of this evaluation, patient reports she is breathing much better and has not complaints at this time other than a sore shoulder from the fall.  Patient is a former smoker but quit about 10 years ago. Patient states she has a family history of heart disease in her children.  One daughter who lives in [place] has a pacemaker and another daughter who lives here has some type of heart disease but patient was unable to specify.  Past Medical History   Past Medical History:  Diagnosis Date   Abdominal pain    Anxiety    maintained on Xanax tid for years.   Arthritis    FEET, KNEES, HANDS   Atrial fibrillation ([place]) 40/9811   Chronic systolic CHF (congestive heart failure), NYHA class 2 (HCC)    EF 25% 2011 MV,  50-55% echo 6/13   Diabetes mellitus type 2, diet-controlled (HCC)    GERD (gastroesophageal reflux disease)    Glaucoma    s/p surgery B in 30s.  [place].   H/O hiatal hernia    History of glaucoma    History of small bowel obstruction 2009 AND NOV 2012   HTN (hypertension)    Hypothyroidism    Left ovarian cyst    Moderate mitral regurgitation    Moderate tricuspid regurgitation    Non-healing surgical wound    ABDOMINAL S/P EXPLORATOY LAPAROTOMY 04-18-2011   Nonischemic cardiomyopathy ([place]) DR [facility]    EF is 25% per echo February 2012, non ischemic myoview 12/2009.  EF 50% echo 7/12 and plans for ICD cancelled.    Peptic ulcer disease    PVC (premature ventricular contraction)    Shortness of breath    Spinal stenosis    Urge urinary incontinence    Valvular heart disease    Moderate to severe MR, moderate TR, LAE per echo 7/11   Wears dentures    upper denture-lower partial    Past Surgical History:  Procedure Laterality Date   ABDOMINAL HERNIA REPAIR  32 YRS AGO   [place]   RUPTURED   BENIGN RIGHT BREAST TUMOR REMOVED     CARDIOVASCULAR STRESS TEST  JULY 2011-  DR HOCHREIN   NO ISCHEMIA   CARDIOVERSION N/A 08/17/2018   Procedure: CARDIOVERSION;  Surgeon: Sueanne Margarita, MD;  Location: [facility];  Service: Cardiovascular;  Laterality: N/A;   EXPLORATOMY LAP. / EXTENSIVE LYSIS ADHESIONS/ SMALL BOWEL RESECTION X2 WITH PRIMARY ANASTOMOSIS X2  04-18-2011   SMALL BOWEL  OBSTRUCTION   GLAUCOMA SURGERY  1978   HERNIA REPAIR     INCISION AND DRAINAGE OF WOUND N/A 09/13/2012   Procedure: INCISION  AND DEBRIDEMENT WOUND abdominal wall ;  Surgeon: Rolm Bookbinder, MD;  Location: [facility];  Service: General;  Laterality: N/A;  coordination with Dr Migdalia Dk    KNEE ARTHROSCOPY W/ MENISCECTOMY  06-05-2004   TEE WITHOUT CARDIOVERSION N/A 08/17/2018   Procedure: TRANSESOPHAGEAL ECHOCARDIOGRAM (TEE);  Surgeon: Sueanne Margarita, MD;  Location: [facility] END[facility];  Service: Cardiovascular;  Laterality: N/A;   [place]  AGE 61   TRANSTHORACIC ECHOCARDIOGRAM  12-27-2010   EF 45-50%/ MODERATE MV REGURG. / LEFT ATRIUM MILDLY DILATED/ MILD TRICUSPID [place].   TUBAL LIGATION     VENTRAL HERNIA REPAIR  X4  LAST ONE 20 YRS AGO   ABDOMINAL --  EACH ONE IN DIFFERENT AREA   VENTRAL HERNIA REPAIR  01/29/2012   Procedure: HERNIA REPAIR VENTRAL ADULT;  Surgeon: Theodoro Kos, DO;  Location: [facility];  Service: Plastics;  Laterality: N/A;   WOUND DEBRIDEMENT  09/25/2011   Procedure: DEBRIDEMENT CLOSURE/ABDOMINAL WOUND;  Surgeon: Theodoro Kos, DO;  Location: [facility];  Service: Plastics;  Laterality: N/A;  excision of abdominal wound with primary closure     Allergies  Allergies  Allergen Reactions   Aspirin Other (See Comments)    HX Bleeding Ulcer    Nsaids Other (See Comments)    Hx of bleeding ulcers   Tolmetin Rash    Hx of bleeding ulcers    Inpatient Medications     busPIRone  5 mg Oral BID   citalopram  20 mg Oral Daily   enoxaparin (LOVENOX) injection  40 mg Subcutaneous Q24H   levothyroxine  75 mcg Oral QAC breakfast   metoprolol tartrate  75 mg Oral BID   pantoprazole  40 mg Oral Daily   sodium chloride flush  3 mL Intravenous Q12H    Family History    Family History  Problem Relation Age of Onset   Stroke Brother    Diabetes Brother     Tuberculosis Father    Stroke Mother    Stroke Sister    Heart disease Sister    Emphysema Sister    Diabetes Daughter    She indicated that her mother is deceased. She indicated that her father is deceased. She indicated that her sister is deceased. She indicated that her brother is deceased. She indicated that her maternal grandmother is deceased. She indicated that her maternal grandfather is deceased. She indicated that her paternal grandmother is deceased. She indicated that the status of her daughter is unknown.   Social History    Social History   Socioeconomic History   Marital status: Widowed    Spouse name: Not on file   Number of children: 2   Years of education: Not on file   Highest education level: Not on file  Occupational History   Occupation: RETIRED  Social Designer, fashion/clothing strain: Not on file   Food insecurity:    Worry: Not on file    Inability: Not on file   Transportation needs:    Medical: Not on file    Non-medical: Not on file  Tobacco Use   Smoking status: Former Smoker    Packs/day: 1.50    Years: 45.00    Pack years: 67.50    Types: Cigarettes    Start date: 11/14/1964    Last attempt to quit: 09/22/2010    Years since quitting: 8.0   Smokeless tobacco: Never Used   Tobacco comment: quit 2011 or 2012 - Didn't smoke during pregnancies  Substance and Sexual Activity   Alcohol use: No   Drug use: No   Sexual activity: Not Currently  Lifestyle   Physical activity:    Days per week: Not on file    Minutes per session: Not on file   Stress: Not on file  Relationships   Social connections:    Talks on phone: Not on file    Gets together: Not on file    Attends religious service: Not on file    Active member of club or organization: Not on file    Attends meetings of clubs or organizations: Not on file    Relationship status: Not on file   Intimate partner violence:    Fear of current or ex  partner: Not on  file    Emotionally abused: Not on file    Physically abused: Not on file    Forced sexual activity: Not on file  Other Topics Concern   Not on file  Social History Narrative   Marital status:  Widowed as of 09-16-10; not dating.        Children: four children (1 daughter in [place], 1 daughter in [place] estranged; 1 son in [place]; 1 son deceased in 09-16-2014); six grandchildren; 5 gg.      Lives: alone with a yorkie.  Son/Harry lives close.        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      Tobacco:  None; quit in 16-Sep-2010.        Alcohol: none      Drugs:none      Exercise:  No formal exercise program.  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  Cannot swim.        ADLs:  Driving; cleans own house; does grocery shopping; pays bills; does not use cane for ambulation.  Drives.        Advanced Directives: no living will; no HCPOA.  DNR/DNI.        [facility]:   Originally from [facility]. Has always lived in [place]. She moved to [place], [place]. 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 Has a dog currently. No bird or mold exposure.      Review of Systems    Review of Systems  Constitutional: Positive for malaise/fatigue. Negative for chills and fever.  HENT: Negative for congestion.   Respiratory: Positive for shortness of breath. Negative for cough and hemoptysis.   Cardiovascular: Positive for leg swelling. Negative for chest pain, palpitations, orthopnea and PND.  Gastrointestinal: Negative for blood in stool.  Genitourinary: Negative for hematuria.  Musculoskeletal: Positive for falls and joint pain (sore shoulder from fall). Negative for myalgias.  Neurological: Negative for dizziness and loss of consciousness.  Endo/Heme/Allergies: Does not bruise/bleed easily.  Psychiatric/Behavioral: Negative for substance abuse.  All other systems reviewed and are negative.   Physical Exam    Blood pressure 103/80, pulse (!)  108, temperature (!) 97.5 F (36.4 C), temperature source Oral, resp. rate (!) 22, height 5\' 3"  (1.6 m), weight 98 kg, SpO2 98 %.   In an effort to minimize exposure and conserve PPE given the current COVID-19 pandemic, I did not enter the patient's room or physically exam the patient. I called into the patient's room to obtain a history. Patient was very pleasant and cooperative over the phone and did not sound like she was in any distress. She was able to speak in full sentences without any labored breathing.  MD to follow with full exam.  Labs    Troponin (Point of Care Test) No results for input(s): TROPIPOC in the last 72 hours. Recent Labs    10/13/18 0724  TROPONINI 0.03*   Lab Results  Component Value Date   WBC 7.5 10/15/2018   HGB 11.5 (L) 10/15/2018   HCT 36.3 10/15/2018   MCV 101.7 (H) 10/15/2018   PLT 120 (L) 10/15/2018    Recent Labs  Lab 10/15/18 0320  NA 138  K 3.8  CL 103  CO2 22  BUN 27*  CREATININE 1.50*  CALCIUM 8.2*  PROT 5.0*  BILITOT 1.4*  ALKPHOS 125  ALT 50*  AST 35  GLUCOSE 106*   Lab Results  Component Value  Date   CHOL 226 (H) 06/07/2018   HDL 33 (L) 06/07/2018   LDLCALC 150 (H) 06/07/2018   TRIG 213 (H) 06/07/2018   No results found for: [facility]   Radiology Studies    Dg Chest 2 View  Result Date: 10/13/2018 CLINICAL DATA:  Left leg weakness last night. History of atrial fibrillation. Shortness of breath. EXAM: CHEST - 2 VIEW COMPARISON:  One-view chest x-ray 08/10/2018 FINDINGS: The heart is enlarged. Atherosclerotic calcifications are present at the aortic arch. A left pleural effusion is present. Left greater than right basilar airspace disease is noted. Mild pulmonary vascular congestion is noted. The visualized soft tissues and bony thorax are unremarkable. IMPRESSION: 1. Cardiomegaly with new left pleural effusion and pulmonary vascular congestion concerning for congestive heart failure. 2. Left greater than right basilar  airspace disease. While this likely represents atelectasis, infection is not excluded. Electronically Signed   By: San Morelle M.D.   On: 10/13/2018 07:30   Dg Chest Port 1 View  Result Date: 10/15/2018 CLINICAL DATA:  CHF, AFib EXAM: PORTABLE CHEST 1 VIEW COMPARISON:  10/13/2018 FINDINGS: Cardiomegaly. No frank interstitial edema. Small left pleural effusion, mildly improved. Right lung base is clear. No pneumothorax. IMPRESSION: Cardiomegaly with small left pleural effusion, mildly improved. No frank interstitial edema. Electronically Signed   By: Julian Hy M.D.   On: 10/15/2018 07:48    EKG     EKG: All the below EKGs were personally reviewed: - EKG on 10/13/2018 at 6:40am demonstrates: Atrial fibrillation with RVR (rate of 117 bpm). - EKG on 10/13/2018 at 9:21am demonstrates: Normal sinus rhythm, rate 73 bpm, with non-specific ST/T changes.  Telemetry: Telemetry was personally reviewed and demonstrates: Atrial fibrillation with rates ranging from the 90's to 140's and PVCs. Rates currently in the 100's to 110's.  Cardiac Imaging    Echocardiogram 08/13/2018: Impressions: 1. The left ventricle has moderately reduced systolic function, with an ejection fraction of 35-40%. The cavity size was normal. There is mildly increased left ventricular wall thickness. Left ventricular diastology could not be evaluated secondary to  atrial fibrillation. Left ventricular diffuse hypokinesis.  2. The right ventricle has normal systolic function. The cavity was normal. There is no increase in right ventricular wall thickness.  3. Left atrial size was moderately dilated.  4. Right atrial size was mildly dilated.  5. The mitral valve is degenerative. Mild thickening of the mitral valve leaflet.  6. The aortic valve was not well visualized Mild calcification of the aortic valve. Aortic valve regurgitation is mild by color flow Doppler. The jet is posteriorly-directed no stenosis of the aortic  valve.  7. The aortic root and ascending aorta are normal in size and structure.  Summary: LVEF 35-40%, mild LVH, severe global hypokinesis, moderate LAE, mild RAE, trivail MR, mild aortic valve calcification with mild posteriorly directed AI, IVC not visualized. _______________  Myoview 04/08/2016:  Nuclear stress EF: 55%.  There was no ST segment deviation noted during stress.  Defect 1: There is a small defect of mild severity present in the mid anteroseptal location.  The study is normal.  This is a low risk study.  The left ventricular ejection fraction is normal (55-65%).   Normal stress nuclear study with mild soft tissue attenuation but no ischemia or infarction; EF 55 with normal wall motion.  Assessment & Plan    Acute on Chronic Systolic CHF - - Patient presented with progressive weakness and some shortness of breath with exertion. She was found to  be in atrial fibrillation with RVR. - Chest x-ray showed cardiomegaly with new left pleural effusion and pulmonary vascular congestion as well as bilateral basilar disease (left > right) that likely represents atelectasis.  - BNP elevated at 1,262.8. - Recent Echo from 08/13/2018 showed LVEF of 35 to 40% with severe global hypokinesis. Left atrium was noted to be moderately dilated. - Patient has received 3 doses of IV Lasix 40mg . Documented output of 1.45 L in the past 24 hours but only net negative 425 mL since admission. - Will defer additional Lasix to MD who will assess volume status. - Continue beta-blocker as below. - Patient previously on Losartan but this was discontinued at last discharge due to AKI. - Continue to monitor daily weights, strict I/O's, and renal function. - Suspect atrial fibrillation may be contributing to CHF exacerbation. I think patient would benefit if we could get her to maintain sinus rhythm.   Atrial Fibrillation with RVR - Patient found to have new onset atrial fibrillation during last  hospitalization in February to March 2020. He underwent successful TEE/DCCV at that time. However, was noted to be back in atrial fibrillation with RVR on admission. Patient initially started on IV Cardizem and it looks like patient converted back to sinus rhythm based on most recent EKG on 10/13/2018. Cardizem was discontinued yesterday and patient has since gone back to atrial fibrillation. - Telemetry currently shows atrial fibrillation with rates ranging from the 90's to 110's.  - Patient currently on Lopressor 75mg  twice daily. Will switch to 50mg  every 6 hours with hold parameters if systolic BP <97. -  [id number] = 6 (CHF, HTN, DM, age x2, female). Per last discharge summary, patient was to be discharged on Eliquis. However, I do not see that listed on her discharge medication list and it is not on her home medication  List. Per H&P this admission, patient "needs a joint decision making conversation about anticoagulation in the context of peptic ulcer disease and GI bleeding in the past. Was supposed to happen with cardiologist on discharge and eliquis was not prescribed on discharge." Patient has been started on Eliquis 2.5mg  twice daily here. OK to increase to 5mg  twice daily as patient only meets 1 of the criteria for reduced dosing (creatinine > or = to 1.5, <60 kg, > or = 78 years old). Will confirm with pharmacy.  Mitral Regurgitation. - TEE from 08/17/2018 showed moderate to severe mitral regurgitation. - Per Dr. Percival Spanish, "will follow this with echo although it will be difficult because she does not have a murmur and this was not obvious on TTE."  Pleural Effusion  - Chest x-ray showed new left pleural effusion. - Possibly secondary to acute CHF.  AKI - Serum creatinine 1.50 today. Patient had AKI during last admission for acute GI illness and creatinine peaked at 4.22. However, baseline prior to this was around 0.90. - Continue to monitor.  Otherwise, per primary team. -  Hypertension - Type 2 Diabetes Mellitus - Hypothyroidism - Anxiety - Thrombocytopenia   Signed, Darreld Mclean, PA-C 10/15/2018, 9:31 AM Pager: [phone] For questions or updates, please contact   Please consult [email or web address] for contact info under Cardiology/STEMI.  Personally seen and examined. Agree with above.  78 year old with paroxysmal atrial fibrillation here with rapid ventricular response and acute systolic/diastolic heart failure.  Currently she states that she is feeling a little bit better, breathing is improved but not at baseline.  She recounted her story how she was weak and fell and  hit her shoulder.  She does not remember having any gastrointestinal bleeding.  She also does not recall ever taking Eliquis at home.  GEN: Well nourished, well developed, in no acute distress Overweight HEENT: normal  Neck: no JVD, carotid bruits, or masses Cardiac: Irregularly irregular; no murmurs, rubs, or gallops, 1+ bilateral lower extremity edema  Respiratory:  clear to auscultation bilaterally, mildly increased work of breathing GI: soft, nontender, nondistended, + BS MS: no deformity or atrophy  Skin: warm and dry, no rash Neuro:  Alert and Oriented x 3, Strength and sensation are intact Psych: euthymic mood, full affect  Creatinine 1.5, troponin normal, TSH 3.2  Transesophageal echocardiogram 08/17/2018    1. The left ventricle has mild-moderately reduced systolic function, with an ejection fraction of 40-45%.  2. The right ventricle has normal systolc function. The cavity was normal. There is no increase in right ventricular wall thickness.  3. Left atrial size was moderately dilated.  4. Normal LA appendage with no evidence of thrombus in LA or LAA.  5. The mitral valve is normal in structure. Mitral valve regurgitation is moderate to severe by color flow Doppler.  6. The tricuspid valve was normal in structure. Tricuspid valve regurgitation is mild-moderate.  7.  The aortic valve is tricuspid Mild sclerosis of the aortic valve.  8. The pulmonic valve was normal in structure.  9. There is evidence of plaque in the descending aorta. 10. The interatrial septum appears to be lipomatous.  Underwent cardioversion  Assessment and plan:  Atrial fibrillation with rapid ventricular response, paroxysmal - Had TEE cardioversion on 08/17/2018.  Possibly reverted back to atrial fibrillation fairly quickly according to family member. - She has not been taking Eliquis.  We have restarted increasing her dose to 5 mg twice a day. - We will try to improve rate control with metoprolol given her reduced ejection fraction and avoid diltiazem if possible. - If absolutely necessary to improve rate control, we may need to utilize amiodarone given her low normal blood pressure.  However, it would be nice to be on full anticoagulation confidently prior to using amiodarone for fear of conversion and possible thrombus.  TEE 2 months ago did not show any evidence of left atrial thrombus. - We will give her metoprolol tartrate, short acting, 50 mg 4 times a day to have her try to reach a steady state more quickly and hopefully improve her rate control.  Blood pressure may be an obstacle.  Deconditioning - Continue to encourage physical therapy, exercise.  Morbid obesity -Continue to encourage weight loss.  Mitral regurgitation -3+ mitral regurg.  Difficult to hear a murmur.  Continue to monitor.  Candee Furbish, MD

## 2018-10-16 LAB — BASIC METABOLIC PANEL
Anion gap: 12 (ref 5–15)
BUN: 31 mg/dL — ABNORMAL HIGH (ref 8–23)
CO2: 25 mmol/L (ref 22–32)
Calcium: 8.5 mg/dL — ABNORMAL LOW (ref 8.9–10.3)
Chloride: 102 mmol/L (ref 98–111)
Creatinine, Ser: 1.88 mg/dL — ABNORMAL HIGH (ref 0.44–1.00)
GFR calc Af Amer: 29 mL/min — ABNORMAL LOW (ref >60)
GFR calc non Af Amer: 25 mL/min — ABNORMAL LOW (ref >60)
Glucose, Bld: 153 mg/dL — ABNORMAL HIGH (ref 70–99)
Potassium: 3.2 mmol/L — ABNORMAL LOW (ref 3.5–5.1)
Sodium: 139 mmol/L (ref 135–145)

## 2018-10-16 LAB — CBC
HCT: 36.6 % (ref 36.0–46.0)
Hemoglobin: 11.5 g/dL — ABNORMAL LOW (ref 12.0–15.0)
MCH: 32 pg (ref 26.0–34.0)
MCHC: 31.4 g/dL (ref 30.0–36.0)
MCV: 101.9 fL — ABNORMAL HIGH (ref 80.0–100.0)
Platelets: 127 10*3/uL — ABNORMAL LOW (ref 150–400)
RBC: 3.59 MIL/uL — ABNORMAL LOW (ref 3.87–5.11)
RDW: 15.3 % (ref 11.5–15.5)
WBC: 7.4 10*3/uL (ref 4.0–10.5)
nRBC: 0 % (ref 0.0–0.2)

## 2018-10-16 MED ORDER — POTASSIUM CHLORIDE CRYS ER 20 MEQ PO TBCR
40.0000 meq | EXTENDED_RELEASE_TABLET | Freq: Once | ORAL | Status: AC
  Administered 2018-10-16: 11:00:00 40 meq via ORAL
  Filled 2018-10-16: qty 2

## 2018-10-16 NOTE — Progress Notes (Signed)
  Date: 10/16/2018  Patient name: Teresa [name]  Medical record number: [id number]  Date of birth: May 28, 1941   I have seen and evaluated this patient and I have discussed the plan of care with the house staff. Please see Dr. Frederico Hamman' note for complete details. I concur with her findings and plan.   If no recommendations from Cardiology today, would call them in the AM for further evaluation.    Sid Falcon, MD 10/16/2018, 7:01 PM

## 2018-10-16 NOTE — Progress Notes (Signed)
   Subjective: She feels well like "I should be at home." She denies chest pain and says she never had chest pain. She states that she has been able to walk around without difficulty. Informed her that her heart rate is still too fast and she needs to stay in the hospital to continue adjusting her medications.   Objective:  Vital signs in last 24 hours: Vitals:   10/15/18 2132 10/16/18 0505 10/16/18 0834 10/16/18 1123  BP: 116/87 (!) 94/51 103/77 117/90  Pulse: (!) 112 (!) 125 (!) 110 (!) 125  Resp: (!) 23     Temp: (!) 97.5 F (36.4 C) (!) 97.5 F (36.4 C)    TempSrc: Oral Oral    SpO2: 98% 96% 98% (!) 89%  Weight:  98.3 kg    Height:       Gen: elderly female, sitting up in bed in no acute distress  Cardiac: Irregularly irregular rhythm, tachycardic Pulm: mild L basilar crackles, satting well on RA ,normal wob  Ext: warm and well perfused, trace to 1+ bilateral pitting LEE  Assessment/Plan:  Principal Problem:   Atrial fibrillation with RVR (HCC) Active Problems:   Acute on chronic congestive heart failure (HCC)   AKI (acute kidney injury) ([place])   Hypomagnesemia   Transaminitis   Thrombocytopenia ([place])  # Atrial fibrillation with RVR  # Chronic systolic heart failure  Continue to feel well but remains in RVR despite adjustments to metoprolol dose yesterday. Awaiting cardiology recommendations. No signs/symptoms of low output HF state but will continue to monitor closely.  - Cardiology following, appreciate recommendations  - Continue metoprolol 50 mg BID and Eliquis 5 mg BID  - Strict I/Os and daly weights  - Holding Lasix in the setting of AKI   # AKI on CKD Stage II: Cr 1.5-> 1.9.  - Hold home Lasix today  - BMP in AM   Dispo: Anticipated discharge in approximately 1-2 day(s) pending improvement in cardiac status.   Isabelle Course, MD 10/16/2018, 11:33 AM Pager: [phone]

## 2018-10-17 DIAGNOSIS — I340 Nonrheumatic mitral (valve) insufficiency: Secondary | ICD-10-CM

## 2018-10-17 DIAGNOSIS — I4819 Other persistent atrial fibrillation: Secondary | ICD-10-CM

## 2018-10-17 LAB — BASIC METABOLIC PANEL
Anion gap: 14 (ref 5–15)
Anion gap: 14 (ref 5–15)
BUN: 34 mg/dL — ABNORMAL HIGH (ref 8–23)
BUN: 36 mg/dL — ABNORMAL HIGH (ref 8–23)
CO2: 23 mmol/L (ref 22–32)
CO2: 20 mmol/L — ABNORMAL LOW (ref 22–32)
Calcium: 8.5 mg/dL — ABNORMAL LOW (ref 8.9–10.3)
Calcium: 8.4 mg/dL — ABNORMAL LOW (ref 8.9–10.3)
Chloride: 100 mmol/L (ref 98–111)
Chloride: 101 mmol/L (ref 98–111)
Creatinine, Ser: 1.9 mg/dL — ABNORMAL HIGH (ref 0.44–1.00)
Creatinine, Ser: 1.91 mg/dL — ABNORMAL HIGH (ref 0.44–1.00)
GFR calc Af Amer: 29 mL/min — ABNORMAL LOW (ref >60)
GFR calc Af Amer: 29 mL/min — ABNORMAL LOW (ref >60)
GFR calc non Af Amer: 25 mL/min — ABNORMAL LOW (ref >60)
GFR calc non Af Amer: 25 mL/min — ABNORMAL LOW (ref >60)
Glucose, Bld: 117 mg/dL — ABNORMAL HIGH (ref 70–99)
Glucose, Bld: 196 mg/dL — ABNORMAL HIGH (ref 70–99)
Potassium: 4.1 mmol/L (ref 3.5–5.1)
Potassium: 3.7 mmol/L (ref 3.5–5.1)
Sodium: 137 mmol/L (ref 135–145)
Sodium: 135 mmol/L (ref 135–145)

## 2018-10-17 MED ORDER — FUROSEMIDE 10 MG/ML IJ SOLN
40.0000 mg | Freq: Once | INTRAMUSCULAR | Status: AC
  Administered 2018-10-17: 11:00:00 40 mg via INTRAVENOUS
  Filled 2018-10-17: qty 4

## 2018-10-17 MED ORDER — POTASSIUM CHLORIDE 20 MEQ PO PACK
40.0000 meq | PACK | Freq: Once | ORAL | Status: AC
  Administered 2018-10-17: 22:00:00 40 meq via ORAL
  Filled 2018-10-17: qty 2

## 2018-10-17 MED ORDER — METOPROLOL SUCCINATE ER 100 MG PO TB24
100.0000 mg | ORAL_TABLET | Freq: Every day | ORAL | Status: DC
  Administered 2018-10-17 – 2018-10-20 (×4): 100 mg via ORAL
  Filled 2018-10-17 (×4): qty 1

## 2018-10-17 MED ORDER — AMIODARONE HCL 200 MG PO TABS
200.0000 mg | ORAL_TABLET | Freq: Two times a day (BID) | ORAL | Status: DC
  Administered 2018-10-17 – 2018-10-19 (×5): 200 mg via ORAL
  Filled 2018-10-17 (×5): qty 1

## 2018-10-17 MED ORDER — ATORVASTATIN CALCIUM 10 MG PO TABS
10.0000 mg | ORAL_TABLET | Freq: Every day | ORAL | Status: DC
  Administered 2018-10-17 – 2018-10-20 (×4): 10 mg via ORAL
  Filled 2018-10-17 (×4): qty 1

## 2018-10-17 MED ORDER — FUROSEMIDE 10 MG/ML IJ SOLN
40.0000 mg | Freq: Once | INTRAMUSCULAR | Status: AC
  Administered 2018-10-17: 21:00:00 40 mg via INTRAVENOUS
  Filled 2018-10-17: qty 4

## 2018-10-17 NOTE — Progress Notes (Signed)
Mr. he was examined at the bedside.  She states that she is overall doing well.  She has been able to briefly get out of bed and does have some dyspnea on exertion.  She received a dose of 40 mg IV Lasix this morning.  She believes that she has had a decent amount of urine but nothing more than she usually does.  She does believe that her lower extremity edema has somewhat improved.   Blood pressure (!) 107/96, pulse 67, temperature (!) 97.3 F (36.3 C), temperature source Oral, resp. rate 17, height 5\' 3"  (1.6 m), weight 94.6 kg, SpO2 99 %. General: Lying in bed in no acute distress Cardiovascular: Distant heart sounds auscultated, normal rate Respiratory: Initial lung sounds bilaterally, faint crackles at the lung bases bilaterally MSK: 1+ pitting edema of the lower extremities bilaterally, no warmth or erythema noted  Assessment/plan: She does appear to be hypervolemic on exam.  Unfortunately urine output was not measured throughout the day.  Last  output noted at 0300 a.m. last night was 300 mils with 2 unmeasured urine occurences.  Will plan on giving an additional 40 mg IV Lasix tonight.  I have instructed the nursing staff to measure urinary output throughout the night.

## 2018-10-17 NOTE — Consult Note (Signed)
ELECTROPHYSIOLOGY CONSULT NOTE    Primary Care Physician: [name], MD Referring Physician:  Dr Isac Sarna  Admit Date: 10/13/2018  Reason for consultation:  afib  Teresa [name] is a 78 y.o. female with a h/o persistent atrial fibrillation and nonischemic CM, now admitted with symptomatic persistent afib.  She reports initially being diagnosed with CHF several years ago.  Her EF has intermittently improved and she has had mild MR.  She has had disorganized atrial activity/ salvos of atrial ectopy since at least 2014 by ekg.  More recently, she has developed afib (over the past 3 months) with symptoms of worsening fatigue and SOB.  She underwent cardioversion March 08/2018.  She does not have palpitations and is not sure when she returned to afib.  She has been readmitted with worsening CHF symptoms. Attempts at rate control have been difficult.  She is making some progress clinically and wants to go home. Today, she denies symptoms of palpitations, chest pain, shortness of breath, orthopnea, PND, lower extremity edema, dizziness, presyncope, syncope, or neurologic sequela. The patient is tolerating medications without difficulties and is otherwise without complaint today.   Past Medical History:  Diagnosis Date  . Abdominal pain   . Anxiety    maintained on Xanax tid for years.  . Arthritis    FEET, KNEES, HANDS  . Atrial fibrillation ([place]) 09/2018  . Chronic systolic CHF (congestive heart failure), NYHA class 2 ([place])    EF 25% 2011 MV,  50-55% echo 6/13  . Diabetes mellitus type 2, diet-controlled ([place])   . GERD (gastroesophageal reflux disease)   . Glaucoma    s/p surgery B in 30s.  [place].  . H/O hiatal hernia   . History of glaucoma   . History of small bowel obstruction 2009 AND NOV 2012  . HTN (hypertension)   . Hypothyroidism   . Left ovarian cyst   . Moderate mitral regurgitation   . Moderate tricuspid regurgitation   . Non-healing surgical wound    ABDOMINAL  S/P EXPLORATOY LAPAROTOMY 04-18-2011  . Nonischemic cardiomyopathy ([place]) [facility]    EF is 25% per echo February 2012, non ischemic myoview 12/2009.  EF 50% echo 7/12 and plans for ICD cancelled.   . Peptic ulcer disease   . PVC (premature ventricular contraction)   . Shortness of breath   . Spinal stenosis   . Urge urinary incontinence   . Valvular heart disease    Moderate to severe MR, moderate TR, LAE per echo 7/11  . Wears dentures    upper denture-lower partial   Past Surgical History:  Procedure Laterality Date  . ABDOMINAL HERNIA REPAIR  32 YRS AGO   PERITINITIS  . APPENDECTOMY  1979   RUPTURED  . BENIGN RIGHT BREAST TUMOR REMOVED    . CARDIOVASCULAR STRESS TEST  JULY 2011-  DR HOCHREIN   NO ISCHEMIA  . CARDIOVERSION N/A 08/17/2018   Procedure: CARDIOVERSION;  Surgeon: Sueanne Margarita, MD;  Location[facility] END[facility]COPY;  Service: Cardiovascular;  Laterality: N/A;  . EXPLORATOMY LAP. / EXTENSIVE LYSIS ADHESIONS/ SMALL BOWEL RESECTION X2 WITH PRIMARY ANASTOMOSIS X2  04-18-2011   SMALL BOWEL OBSTRUCTION  . GLAUCOMA SURGERY  1978  . HERNIA REPAIR    . INCISION AND DRAINAGE OF WOUND N/A 09/13/2012   Procedure: INCISION  AND DEBRIDEMENT WOUND abdominal wall ;  Surgeon: Rolm Bookbinder, MD;  Location: [place];  Service: General;  Laterality: N/A;  coordination with Dr Migdalia Dk   . KNEE ARTHROSCOPY W/  MENISCECTOMY  06-05-2004  . TEE WITHOUT CARDIOVERSION N/A 08/17/2018   Procedure: TRANSESOPHAGEAL ECHOCARDIOGRAM (TEE);  Surgeon: Sueanne Margarita, MD;  Location: [facility] ENDOSCOPY;  Service: Cardiovascular;  Laterality: N/A;  . THYROIDECTOMY  1978 GOITOR   AND CERVICAL COLD KNIFE CONE BX  . TONSILLECTOMY  AGE 29  . TRANSTHORACIC ECHOCARDIOGRAM  12-27-2010   EF 45-50%/ MODERATE MV REGURG. / LEFT ATRIUM MILDLY DILATED/ MILD TRICUSPID REGURG.  . TUBAL LIGATION    . VENTRAL HERNIA REPAIR  X4  LAST ONE 20 YRS AGO   ABDOMINAL --  EACH ONE IN DIFFERENT AREA  . VENTRAL HERNIA REPAIR  01/29/2012    Procedure: HERNIA REPAIR VENTRAL ADULT;  Surgeon: Theodoro Kos, DO;  Location: [place];  Service: Plastics;  Laterality: N/A;  . WOUND DEBRIDEMENT  09/25/2011   Procedure: DEBRIDEMENT CLOSURE/ABDOMINAL WOUND;  Surgeon: Theodoro Kos, DO;  Location: [place];  Service: Plastics;  Laterality: N/A;  excision of abdominal wound with primary closure    . apixaban  5 mg Oral BID  . atorvastatin  10 mg Oral Daily  . busPIRone  5 mg Oral BID  . citalopram  20 mg Oral Daily  . furosemide  40 mg Intravenous Once  . levothyroxine  75 mcg Oral QAC breakfast  . metoprolol tartrate  50 mg Oral Q6H  . pantoprazole  40 mg Oral Daily  . sodium chloride flush  3 mL Intravenous Q12H   . sodium chloride      Allergies  Allergen Reactions  . Aspirin Other (See Comments)    HX Bleeding Ulcer   . Nsaids Other (See Comments)    Hx of bleeding ulcers  . Tolmetin Rash    Hx of bleeding ulcers    Social History   Socioeconomic History  . Marital status: Widowed    Spouse name: Not on file  . Number of children: 2  . Years of education: Not on file  . Highest education level: Not on file  Occupational History  . Occupation: RETIRED  Social Needs  . Financial resource strain: Not on file  . Food insecurity:    Worry: Not on file    Inability: Not on file  . Transportation needs:    Medical: Not on file    Non-medical: Not on file  Tobacco Use  . Smoking status: Former Smoker    Packs/day: 1.50    Years: 45.00    Pack years: 67.50    Types: Cigarettes    Start date: 11/14/1964    Last attempt to quit: 09/22/2010    Years since quitting: 8.0  . Smokeless tobacco: Never Used  . Tobacco comment: quit 2011 or 2012 - Didn't smoke during pregnancies  Substance and Sexual Activity  . Alcohol use: No  . Drug use: No  . Sexual activity: Not Currently  Lifestyle  . Physical activity:    Days per week: Not on file    Minutes per session: Not on file  . Stress:  Not on file  Relationships  . Social connections:    Talks on phone: Not on file    Gets together: Not on file    Attends religious service: Not on file    Active member of club or organization: Not on file    Attends meetings of clubs or organizations: Not on file    Relationship status: Not on file  . Intimate partner violence:    Fear of current or ex partner: Not on  file    Emotionally abused: Not on file    Physically abused: Not on file    Forced sexual activity: Not on file  Other Topics Concern  . Not on file  Social History Narrative   Marital status:  Widowed as of 09-14-10; not dating.        Children: four children (1 daughter in [place], 1 daughter in [place] estranged; 1 son in [place]; 1 son deceased in Sep 14, 2014); six grandchildren; 5 gg.      Lives: alone with a yorkie.  Son/Harry lives close.        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      Tobacco:  None; quit in 2010-09-14.        Alcohol: none      Drugs:none      Exercise:  No formal exercise program.  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  Cannot swim.        ADLs:  Driving; cleans own house; does grocery shopping; pays bills; does not use cane for ambulation.  Drives.        Advanced Directives: no living will; no HCPOA.  DNR/DNI.        [facility]:   Originally from [facility]. Has always lived in [place]. She moved to [place], [place]. 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 Has a dog currently. No bird or mold exposure.     Family History  Problem Relation Age of Onset  . Stroke Brother   . Diabetes Brother   . Tuberculosis Father   . Stroke Mother   . Stroke Sister   . Heart disease Sister   . Emphysema Sister   . Diabetes Daughter     ROS- All systems are reviewed and negative except as per the HPI above  Physical Exam: Telemetry:  Afib, V rates at times elevated, though mostly < 100 bpm today Vitals:   10/16/18 2139-09-14 10/17/18  0140 10/17/18 0218 10/17/18 0535  BP:  (!) 96/45  (!) 107/55  Pulse:  (!) 123  (!) 121  Resp:    17  Temp: (!) 97.5 F (36.4 C)   98 F (36.7 C)  TempSrc: Oral   Oral  SpO2:    100%  Weight:   94.6 kg   Height:        GEN- The patient is elderly appearing, alert and oriented x 3 today.   Appears older than her stated age Head- normocephalic, atraumatic Eyes-  Sclera clear, conjunctiva pink Ears- hearing intact Oropharynx- clear Neck- supple,   Lungs-  normal work of breathing Heart- irregular rate and rhythm  GI- soft, NT, ND, + BS Extremities- no clubbing, cyanosis + dependant edema MS- diffuse atrophy Skin- no rash or lesion Psych- euthymic mood, full affect Neuro- strength and sensation are intact  EKGs in epic are reviewed,  afib noted  Labs:   Lab Results  Component Value Date   WBC 7.4 10/16/2018   HGB 11.5 (L) 10/16/2018   HCT 36.6 10/16/2018   MCV 101.9 (H) 10/16/2018   PLT 127 (L) 10/16/2018    Recent Labs  Lab 10/15/18 0320  10/17/18 0337  NA 138   < > 137  K 3.8   < > 4.1  CL 103   < > 100  CO2 22   < > 23  BUN 27*   < > 34*  CREATININE 1.50*   < > 1.90*  CALCIUM  8.2*   < > 8.5*  PROT 5.0*  --   --   BILITOT 1.4*  --   --   ALKPHOS 125  --   --   ALT 50*  --   --   AST 35  --   --   GLUCOSE 106*   < > 117*   < > = values in this interval not displayed.   Lab Results  Component Value Date   CKTOTAL 27 05/21/2016   CKMB 3.8 04/14/2008   TROPONINI 0.03 (HH) 10/13/2018    Lab Results  Component Value Date   CHOL 226 (H) 06/07/2018   CHOL 243 (H) 12/21/2017   CHOL 247 (H) 09/09/2017   Lab Results  Component Value Date   HDL 33 (L) 06/07/2018   HDL 33 (L) 12/21/2017   HDL 32 (L) 09/09/2017   Lab Results  Component Value Date   LDLCALC 150 (H) 06/07/2018   LDLCALC 150 (H) 12/21/2017   LDLCALC 165 (H) 09/09/2017   Lab Results  Component Value Date   TRIG 213 (H) 06/07/2018   TRIG 300 (H) 12/21/2017   TRIG 251 (H) 09/09/2017    Lab Results  Component Value Date   CHOLHDL 6.8 (H) 06/07/2018   CHOLHDL 7.4 (H) 12/21/2017   CHOLHDL 7.7 (H) 09/09/2017   Lab Results  Component Value Date   LDLDIRECT 182.5 09/12/2010       Echo:  Recent TEE and 2D echo are reviewedf  ASSESSMENT AND PLAN:   1. Persistent atrial fibrillation The patient has symptomatic, recurrent persistent atrial fibrillation. she has not tried AAD therapy.  Rate control has been marginal. Chads2vasc score is 6.  she is anticoagulated with eliquis.  She has at least moderate LA enlargement as well as moderate to severe MR and CHF.  Though I think that she would do better with sinus rhythm, I suspect that ability to maintain sinus rhythm long term is low.  Her AAD options are limited to sotalol, tikosyn, and amiodarone.  Given labile creatinine, I would avoid sotalol and tikosyn.  She is a poor candidate for ablation currently.  Risk, benefits, and alternatives to amiodarone therapy was discussed with her today.  She understands risks include but are note limited to occular, thyroid, liver, and pulmonary toxicity and wishes to proceed.  I will therefore start amiodarone 200mg  BID today. Change metoprolol to toprol xl 100mg  dialy  OK to discharge in am with close outpatient follow-up in the AF clinic. Follow-up in AF clinic with a virtual visit in 3-5 days.  Would plan for cardioversion in 2-4 weeks, depending on her clinical course.  2. Nonischemic CM/ moderate to severe MR Likely secondary to AF with RVR.  If with sinus rhythm, her MR does not improve, consideration to surgical repair with MAZE, and LAA clip could be considered  Thompson Grayer, MD 10/17/2018  10:47 AM

## 2018-10-17 NOTE — Progress Notes (Signed)
Internal Medicine Attending Attestation:   I have seen and evaluated this patient and I have discussed the plan of care with the house staff. Please see their note for complete details. I concur with their findings with the following additions/corrections:   Seen together with the team on rounds this morning.  She reports feeling well and is anxious to go home, but rate control remains poor.  Review of telemetry shows persistent atrial fibrillation with ventricular rates ranging from 100-130s.  Appreciate Dr. Rayann Heman seeing her today, agree with recommendation for starting amiodarone.  She should follow-up in A. fib clinic after discharge.  She also has evidence of hypervolemia on my exam today due to known HFrEF and poor tolerance of atrial fibrillation and tachycardia.  She has not been diuresed in the last couple days due to rising creatinine, but I suspect her elevated creatinine is more related to cardiorenal syndrome.  We will try diuresis today and see if her renal function and volume status improves.  Oda Kilts, MD 10/17/2018, 1:19 PM

## 2018-10-17 NOTE — Progress Notes (Signed)
   Subjective:  Teresa [name] continues to feel well. She denies chest pain and shortness of breath. She is eager to go home. States she has responsibilities and does not want to stay in the hospital. Misses her dog and grandchildren.   Objective:  Vital signs in last 24 hours: Vitals:   10/16/18 2141 10/17/18 0140 10/17/18 0218 10/17/18 0535  BP:  (!) 96/45  (!) 107/55  Pulse:  (!) 123  (!) 121  Resp:    17  Temp: (!) 97.5 F (36.4 C)   98 F (36.7 C)  TempSrc: Oral   Oral  SpO2:    100%  Weight:   94.6 kg   Height:       Gen: elderly female, in bed in no acute distress  Cardiac: irregularly irregular rhythm, no mrg, mild JVD  Pulm: mild bibasilar crackles, nl work of breathing and oxygenating well on room air Ext: warm and well perfused, 1-2+ bilateral pitting edema   Assessment/Plan:  Principal Problem:   Atrial fibrillation with RVR (HCC) Active Problems:   Acute on chronic congestive heart failure (HCC)   AKI (acute kidney injury) ([place])   Hypomagnesemia   Transaminitis   Thrombocytopenia ([place])  # Atrial fibrillation with RVR  # Acute on Chronic systolic heart failure  Patient continues to feel well but remains in RVR despite adjustments to metoprolol dose. She is also volume up after holding Lasix x 2 days for AKI, but appears to be perfusing well at this time. Ordered IV Lasix 40 mg x1. Will repeat BMP in the afternoon to monitor renal function. If doing well may repeat Lasix at same dose. Not seen by cardiology yesterday. Will call today and follow up with their recommendations.  - Cardiology following, appreciate recommendations  - Continue metoprolol 50 mg BID and Eliquis 5 mg BID  - IV Lasix 40 mg x1, may repeat in PM depending on renal function  - Strict I/Os and daly weights   # AKI on CKD Stage II: Cr stable at 1.9. Initially holding Lasix due to AKI but patient now appears volume up. Iv Lasix as above with repeat BMP in PM to monitor renal function.  - IV Lasix  as above  - BMP in PM    Dispo: Anticipated discharge in approximately 1-2 day(s) pending improvement in cardiac status.   Welford Roche, MD 10/17/2018, 10:04 AM Pager: [phone]

## 2018-10-17 NOTE — Progress Notes (Signed)
Occupational Therapy Treatment Patient Details Name: Teresa [name] MRN: [id number] DOB: 11-23-1940 Today's Date: 10/17/2018    History of present illness 78 y.o. yo female admited 4/29 w/ PMH significant for afib, HFrEF non ischemic, hypothyroidism, anxiety.  Presents for extreme tiredness and shortness of breath on exertion. CXR reveals left pleural effusion. Treatment for CHF, Afib, pleural effusion     OT comments  Pt. Reluctant to participate but agreed to oob with some encouragement.  Bed mobility S.  Min guard a for sit/stand and pivot to recliner.  Reports son available to assist at home.  Eager for return home and also acknowledges that she doesn't want to complete a lot of physical activity or training prior to d/c.  (see below for details of conversation).    Follow Up Recommendations  Supervision/Assistance - 24 hour;Home health OT    Equipment Recommendations  None recommended by OT    Recommendations for Other Services      Precautions / Restrictions Precautions Precautions: Fall Precaution Comments: slid to the floor immediately prior to hospitalization       Mobility Bed Mobility Overal bed mobility: Needs Assistance Bed Mobility: Supine to Sit     Supine to sit: Supervision     General bed mobility comments: hob flat, exits on L side, no physical assistance needed.  did pull on each sock to guide les oob  Transfers Overall transfer level: Needs assistance Equipment used: 1 person hand held assist Transfers: Sit to/from [facility] Sit to Stand: Min guard Stand pivot transfers: Min guard       General transfer comment: guarding for safety    Balance                                           ADL either performed or assessed with clinical judgement   ADL Overall ADL's : Needs assistance/impaired                       Lower Body Dressing Details (indicate cue type and reason): demonstrated figure four in bed  to adj. socks prior to scooting towards eob Toilet Transfer: Min guard;Cueing for sequencing;Stand-pivot Toilet Transfer Details (indicate cue type and reason): eob to recliner, cues for hand placement. encouraged reaching for arm rests prior to sitting down, pt. still "plopped" with cues         Functional mobility during ADLs: Min guard General ADL Comments: reports furniture walking but "not because i need it just because its there".  declined use of RW, but required hand held assist to transition into standing but reported it was because "my socks are slipping".  declined necessary ambulation ie: to the b.room but also reports wanting to "get out of here".  reviewed her statements contradict each other.  she laughed and said "i know they do, im just lazy, i dont want to do these things but i do want to get out of here".  reports she has a son that lives with her "hes just as stubborn as me, were both nuts".  and laughed. states she has lived in her house over 49 years and is adapted to it and "knows" she will be fine.  max encouragement to reach for arm rests when sitting down and she plopped and then said "well thats how i usually do it".  Vision       Perception     Praxis      Cognition Arousal/Alertness: Awake/alert Behavior During Therapy: WFL for tasks assessed/performed Overall Cognitive Status: Within Functional Limits for tasks assessed                                          Exercises     Shoulder Instructions       General Comments      Pertinent Vitals/ Pain       Pain Assessment: No/denies pain  Home Living                                          Prior Functioning/Environment              Frequency  Min 2X/week        Progress Toward Goals  OT Goals(current goals can now be found in the care plan section)  Progress towards OT goals: Progressing toward goals     Plan Discharge plan remains appropriate     Co-evaluation                 AM-PAC OT "6 Clicks" Daily Activity     Outcome Measure   Help from another person eating meals?: None Help from another person taking care of personal grooming?: A Little Help from another person toileting, which includes using toliet, bedpan, or urinal?: A Little Help from another person bathing (including washing, rinsing, drying)?: A Little Help from another person to put on and taking off regular upper body clothing?: A Little Help from another person to put on and taking off regular lower body clothing?: A Little 6 Click Score: 19    End of Session    OT Visit Diagnosis: Unsteadiness on feet (R26.81);History of falling (Z91.81);Muscle weakness (generalized) (M62.81)   Activity Tolerance Patient tolerated treatment well   Patient Left in chair;with call bell/phone within reach;with chair alarm set   Nurse Communication Other (comment)(reviewed recommendations to stay up in chair through lunch)        Time: 4888-9169 OT Time Calculation (min): 14 min  Charges: OT General Charges $OT Visit: 1 Visit OT Treatments $Self Care/Home Management : 8-22 mins  Janice Coffin, COTA/L 10/17/2018, 12:30 PM

## 2018-10-18 DIAGNOSIS — I5043 Acute on chronic combined systolic (congestive) and diastolic (congestive) heart failure: Secondary | ICD-10-CM

## 2018-10-18 LAB — BASIC METABOLIC PANEL
Anion gap: 16 — ABNORMAL HIGH (ref 5–15)
Anion gap: 14 (ref 5–15)
Anion gap: 13 (ref 5–15)
BUN: 38 mg/dL — ABNORMAL HIGH (ref 8–23)
BUN: 36 mg/dL — ABNORMAL HIGH (ref 8–23)
BUN: 36 mg/dL — ABNORMAL HIGH (ref 8–23)
CO2: 17 mmol/L — ABNORMAL LOW (ref 22–32)
CO2: 23 mmol/L (ref 22–32)
CO2: 25 mmol/L (ref 22–32)
Calcium: 8.5 mg/dL — ABNORMAL LOW (ref 8.9–10.3)
Calcium: 8.8 mg/dL — ABNORMAL LOW (ref 8.9–10.3)
Calcium: 8.8 mg/dL — ABNORMAL LOW (ref 8.9–10.3)
Chloride: 102 mmol/L (ref 98–111)
Chloride: 97 mmol/L — ABNORMAL LOW (ref 98–111)
Chloride: 98 mmol/L (ref 98–111)
Creatinine, Ser: 1.93 mg/dL — ABNORMAL HIGH (ref 0.44–1.00)
Creatinine, Ser: 1.96 mg/dL — ABNORMAL HIGH (ref 0.44–1.00)
Creatinine, Ser: 2.06 mg/dL — ABNORMAL HIGH (ref 0.44–1.00)
GFR calc Af Amer: 28 mL/min — ABNORMAL LOW (ref >60)
GFR calc Af Amer: 28 mL/min — ABNORMAL LOW (ref >60)
GFR calc Af Amer: 26 mL/min — ABNORMAL LOW (ref >60)
GFR calc non Af Amer: 25 mL/min — ABNORMAL LOW (ref >60)
GFR calc non Af Amer: 24 mL/min — ABNORMAL LOW (ref >60)
GFR calc non Af Amer: 23 mL/min — ABNORMAL LOW (ref >60)
Glucose, Bld: 133 mg/dL — ABNORMAL HIGH (ref 70–99)
Glucose, Bld: 126 mg/dL — ABNORMAL HIGH (ref 70–99)
Glucose, Bld: 108 mg/dL — ABNORMAL HIGH (ref 70–99)
Potassium: 5.3 mmol/L — ABNORMAL HIGH (ref 3.5–5.1)
Potassium: 4.7 mmol/L (ref 3.5–5.1)
Potassium: 5.1 mmol/L (ref 3.5–5.1)
Sodium: 135 mmol/L (ref 135–145)
Sodium: 134 mmol/L — ABNORMAL LOW (ref 135–145)
Sodium: 136 mmol/L (ref 135–145)

## 2018-10-18 LAB — MAGNESIUM
Magnesium: 1.5 mg/dL — ABNORMAL LOW (ref 1.7–2.4)
Magnesium: 2.6 mg/dL — ABNORMAL HIGH (ref 1.7–2.4)

## 2018-10-18 MED ORDER — MAGNESIUM SULFATE 4 GM/100ML IV SOLN
4.0000 g | Freq: Once | INTRAVENOUS | Status: AC
  Administered 2018-10-18: 09:00:00 4 g via INTRAVENOUS
  Filled 2018-10-18: qty 100

## 2018-10-18 MED ORDER — FUROSEMIDE 10 MG/ML IJ SOLN
40.0000 mg | Freq: Once | INTRAMUSCULAR | Status: AC
  Administered 2018-10-18: 09:00:00 40 mg via INTRAVENOUS
  Filled 2018-10-18: qty 4

## 2018-10-18 NOTE — TOC Benefit Eligibility Note (Signed)
Transition of Care [facility]) Benefit Eligibility Note    Patient Details  Name: Teresa [name] MRN: [id number] Date of Birth: [birth year]   Medication/Dose: Eliquis 5mg  BID  Covered?: Yes   Prescription Coverage Preferred Pharmacy: [facility] with Person/Company/Phone Number:: [facility] [phone]  Co-Pay: $45.00 for 30 day retail  Prior Approval: No     [place] Phone Number: 10/18/2018, 11:50 AM

## 2018-10-18 NOTE — Progress Notes (Signed)
  Date: 10/18/2018  Patient name: Teresa [name]  Medical record number: [id number]  Date of birth: 1941-02-16   I have seen and evaluated this patient and I have discussed the plan of care with the house staff. Please see their note for complete details. I concur with their findings with the following additions/corrections: [name] was seen this morning on team rounds.  She denies dyspnea at rest but desires to go home.  Her heart rate was 88 when we entered but with talking it increased to the 120s.  She had JVD and rails to mid lung fields bilaterally.  She also had +2 peripheral edema bilaterally.  The team told her weight in the bed with only 1 chuck, the bed sheet, her gown, and her tele, and her weight was 53 kg. She remains volume overloaded.  We will continue diuresis as her creatinine and blood pressure are tolerating.  Bartholomew Crews, MD 10/18/2018, 1:46 PM

## 2018-10-18 NOTE — Progress Notes (Signed)
   Subjective: Feeling well rested this morning. She denies SOB except on exertion. She has no chest pain, nausea, or dizziness. Continues to express desire to go home.   Objective:  Vital signs in last 24 hours: Vitals:   10/17/18 1747 10/17/18 2000 10/17/18 2014 10/18/18 0631  BP:   (!) 107/96 (!) 122/101  Pulse: (!) 109 (!) 112 67 (!) 115  Resp:      Temp: 97.7 F (36.5 C)  (!) 97.3 F (36.3 C) (!) 97.3 F (36.3 C)  TempSrc: Oral  Oral Oral  SpO2:  100% 99% 98%  Weight:      Height:       Constitution: NAD, supine in bed Cardio: irregular rate & rhythm, no m/r/g, +JVD  Respiratory: b/l crackles Abdominal: +BS MSK: +2 LE edema, moving all extremities  GU: purewick in place, urine yellow Neuro: a&o, normal affect Skin: c/d/i   Assessment/Plan:  Principal Problem:   Atrial fibrillation with RVR (HCC) Active Problems:   Acute on chronic congestive heart failure (HCC)   AKI (acute kidney injury) (HCC)   Hypomagnesemia   Transaminitis   Thrombocytopenia (HCC)  Atrial Fibrillation with RVR Acute on Chronic Systolic Heart Failure Moderate-Severe Mitral Regurge Her heart rate is improved since started amiodarone although still mildly elevated. Cardiology and EP have been consulted, EP will plan to follow-up with her in the outpatient setting for cardioversion. She continues to appear volume overloaded and has bilateral crackles. Uop output has been minimal although unclear if it is being completely measured. She was weighed in bed and is currently at 93kg with everything removed from the bed.   - bladder scan ordered  - BMP 4pm, am BMP  - IV lasix 40 mg qd bid - will reassess in the afternoon  - cont. Amiodarone 200 mg bid, toprol 100 mg qd  - cont. Strict ins/outs - cont. Daily weights   AKI on CKD Stage III Creatinine remains stable at 1.9. Given Lasix 40 mg IV bid yesterday with volume overload.   - 4pm BMP, am BMP  - IV lasix 40 mg  VTE: apixaban IVF: none  Diet: heart healthy  Code: Full   Dispo: Anticipated discharge pending improvement in volume status.     Marty Heck, DO 10/18/2018, 6:38 AM Pager: [phone]

## 2018-10-18 NOTE — Progress Notes (Signed)
Progress Note  Patient Name: Teresa [name] Date of Encounter: 10/18/2018  Primary Cardiologist: Minus Breeding, MD   Subjective   78 year old female with a history of persistent atrial fibrillation and a nonischemic cardiomyopathy.  She was admitted on May 3 with persistent symptomatic atrial fibrillation.  She was diagnosed with congestive heart failure several years ago.  Her ejection fraction has intermittently improved.  She was seen yesterday by Dr. Rayann Heman.  She was started on amiodarone and the metoprolol was changed to Toprol-XL 100 mg a day. She is negative for COVID 19  Has diuresed documented 1.3 liters.   I/O were no accurately measured over the weekend    Inpatient Medications    Scheduled Meds: . amiodarone  200 mg Oral BID  . apixaban  5 mg Oral BID  . atorvastatin  10 mg Oral Daily  . busPIRone  5 mg Oral BID  . citalopram  20 mg Oral Daily  . furosemide  40 mg Intravenous Once  . levothyroxine  75 mcg Oral QAC breakfast  . metoprolol succinate  100 mg Oral Daily  . pantoprazole  40 mg Oral Daily  . sodium chloride flush  3 mL Intravenous Q12H   Continuous Infusions: . sodium chloride    . magnesium sulfate 1 - 4 g bolus IVPB 4 g (10/18/18 0839)   PRN Meds: sodium chloride, acetaminophen, calcium carbonate, famotidine, HYDROcodone-acetaminophen, levalbuterol, mirtazapine, sodium chloride flush   Vital Signs    Vitals:   10/17/18 1747 10/17/18 2000 10/17/18 2014 10/18/18 0631  BP:   (!) 107/96 (!) 122/101  Pulse: (!) 109 (!) 112 67 (!) 115  Resp:      Temp: 97.7 F (36.5 C)  (!) 97.3 F (36.3 C) (!) 97.3 F (36.3 C)  TempSrc: Oral  Oral Oral  SpO2:  100% 99% 98%  Weight:    95.8 kg  Height:        Intake/Output Summary (Last 24 hours) at 10/18/2018 0842 Last data filed at 10/18/2018 0600 Gross per 24 hour  Intake 1060 ml  Output 950 ml  Net 110 ml   Last 3 Weights 10/18/2018 10/17/2018 10/16/2018  Weight (lbs) 211 lb 3.2 oz 208 lb 8.9 oz 216 lb  11.4 oz  Weight (kg) 95.8 kg 94.6 kg 98.3 kg      Telemetry    AF at 109 - Personally Reviewed  ECG    AF  - Personally Reviewed  Physical Exam   GEN:  Elderly female, no acute distress Neck: No JVD Cardiac:  Irregularly irregular.  Her heart rate is tachycardic.  No significant murmurs. Respiratory: Clear to auscultation bilaterally. GI: Soft, nontender, non-distended  MS:  Mild bilateral leg edema. Neuro:  Nonfocal , she is hard of hearing. Psych: Normal affect   Labs    Chemistry Recent Labs  Lab 10/14/18 0349 10/15/18 0320  10/17/18 0337 10/17/18 1824 10/18/18 0438  NA 138 138   < > 137 135 135  K 3.8 3.8   < > 4.1 3.7 5.3*  CL 105 103   < > 100 101 102  CO2 21* 22   < > 23 20* 17*  GLUCOSE 111* 106*   < > 117* 196* 133*  BUN 25* 27*   < > 34* 36* 38*  CREATININE 1.47* 1.50*   < > 1.90* 1.91* 1.93*  CALCIUM 8.5* 8.2*   < > 8.5* 8.4* 8.5*  PROT 5.7* 5.0*  --   --   --   --  ALBUMIN 3.3* 3.0*  --   --   --   --   AST 37 35  --   --   --   --   ALT 59* 50*  --   --   --   --   ALKPHOS 148* 125  --   --   --   --   BILITOT 1.1 1.4*  --   --   --   --   GFRNONAA 34* 33*   < > 25* 25* 25*  GFRAA 39* 39*   < > 29* 29* 28*  ANIONGAP 12 13   < > 14 14 16*   < > = values in this interval not displayed.     Hematology Recent Labs  Lab 10/14/18 0349 10/15/18 0320 10/16/18 0228  WBC 8.2 7.5 7.4  RBC 3.96 3.57* 3.59*  HGB 12.6 11.5* 11.5*  HCT 40.0  37.8 36.3 36.6  MCV 101.0* 101.7* 101.9*  MCH 31.8 32.2 32.0  MCHC 31.5 31.7 31.4  RDW 15.2 15.1 15.3  PLT 105* 120* 127*    Cardiac Enzymes Recent Labs  Lab 10/13/18 0724  TROPONINI 0.03*   No results for input(s): TROPIPOC in the last 168 hours.   BNP Recent Labs  Lab 10/13/18 0724  BNP 1,262.8*     DDimer No results for input(s): DDIMER in the last 168 hours.   Radiology    No results found.  Cardiac Studies     Patient Profile     78 y.o. female with AF and chronic systolic CHF (  EF 84-69%.   Assessment & Plan    1.  Persistent atrial fibrillation: The patient has persistent atrial fibrillation.  She does not tolerate the A. fib all that well.  She has been started on amiodarone.  She will follow-up in the A. fib clinic.  She will see Dr. Rayann Heman.  2.  Chronic combined systolic and diastolic congestive heart failure: The patient has documented systolic heart failure.  We were not able to evaluate her diastolic function because of her atrial fibrillation but she likely has some degree of diastolic dysfunction.  Continue diuresis.        For questions or updates, please contact [place] Please consult [email or web address] for contact info under        Signed, Mertie Moores, MD  10/18/2018, 8:42 AM

## 2018-10-18 NOTE — TOC Progression Note (Signed)
Transition of Care [facility]) - Progression Note    Patient Details  Name: MELIZA KAGE MRN: [id number] Date of Birth: Jul 30, 1940  [facility]) CM/SW Contact  Graves-Bigelow, Ocie Cornfield, RN Phone Number: 10/18/2018, 10:35 AM  Clinical Narrative: Benefits Check submitted for Eliquis. CM will make patient aware of cost once completed. CM will continue to monitor for additional transition of care needs.     Expected Discharge Plan: [place] Barriers to Discharge: Continued Medical Work up  Expected Discharge Plan and Services Expected Discharge Plan: [place] In-house Referral: NA Discharge Planning Services: CM Consult Post Acute Care Choice: Durable Medical Equipment, Home Health Living arrangements for the past 2 months: Single Family Home                 DME Arranged: Walker rolling DME Agency: [employer] Date DME Agency Contacted: 10/15/18 Time DME Agency Contacted: 81 Representative spoke with at DME Agency: [place] Arranged: RN, Disease Management, PT [place] Agency: [place] ([place]) Date [place]: 10/15/18 Time [place]: 1600 Representative spoke with at [place]: [place] ([place]) Interventions    Readmission Risk Interventions Readmission Risk Prevention Plan 10/15/2018  Transportation Screening Complete  HRI or [place] Complete  Palliative Care Screening Not Applicable  Medication Review (RN Care Manager) Complete  Some recent data might be hidden

## 2018-10-18 NOTE — Progress Notes (Signed)
Physical Therapy Treatment Patient Details Name: Teresa [name] MRN: [id number] DOB: [birth year] Today's Date: 10/18/2018    History of Present Illness 78 y.o. yo female admited 4/29 w/ PMH significant for afib, HFrEF non ischemic, hypothyroidism, anxiety.  Presents for extreme tiredness and shortness of breath on exertion. CXR reveals left pleural effusion. Treatment for CHF, Afib, pleural effusion      PT Comments    Pt asleep on entry however easily roused and agreeable to therapy. Pt continues to be limited in safe mobility by decreased strength and endurance in presence of 3/4 DoE. Pt requires min guard for mobility and was able to progress ambulation with encouragement. Pt somewhat frustrated with her decline. D/c plans remain appropriate at this time. PT will continue to follow acutely.   Follow Up Recommendations  Home health PT;Supervision/Assistance - 24 hour     Equipment Recommendations  Rolling walker with 5" wheels;3in1 (PT)    Recommendations for Other Services       Precautions / Restrictions Precautions Precautions: Fall Restrictions Weight Bearing Restrictions: No    Mobility  Bed Mobility Overal bed mobility: Needs Assistance Bed Mobility: Supine to Sit     Supine to sit: HOB elevated;Min guard Sit to supine: Supervision   General bed mobility comments: supervision, pt came to longsitting and then brought LE off bed  Transfers Overall transfer level: Needs assistance Equipment used: Rolling walker (2 wheeled) Transfers: Sit to/from Stand Sit to Stand: Min guard         General transfer comment: cues for hand placement and safety x 2 trials from bed and chair  Ambulation/Gait Ambulation/Gait assistance: Min guard Gait Distance (Feet): 30 Feet(1x30, 1x15, 1x10) Assistive device: Rolling walker (2 wheeled) Gait Pattern/deviations: Step-through pattern;Decreased stride length;Trunk flexed Gait velocity: slowed Gait velocity interpretation: <1.8  ft/sec, indicate of risk for recurrent falls General Gait Details: cues for posture and position in RW with chair follow. Pt able to walk 40' x 2 with chair follow limited by fatigue       Balance Overall balance assessment: Needs assistance Sitting-balance support: Feet supported;No upper extremity supported Sitting balance-Leahy Scale: Fair     Standing balance support: Bilateral upper extremity supported Standing balance-Leahy Scale: Poor Standing balance comment: requires UE support for dynamic balance                            Cognition Arousal/Alertness: Awake/alert Behavior During Therapy: WFL for tasks assessed/performed Overall Cognitive Status: Within Functional Limits for tasks assessed                                           General Comments General comments (skin integrity, edema, etc.): HR max with ambulation 135 bpm, SaO2 on RA >90%O throughout session      Pertinent Vitals/Pain Pain Assessment: 0-10 Pain Score: 5  Pain Location: R shoulder pain with use of RW Pain Descriptors / Indicators: Aching;Sore Pain Intervention(s): Limited activity within patient's tolerance;Monitored during session;Repositioned           PT Goals (current goals can now be found in the care plan section) Acute Rehab PT Goals PT Goal Formulation: With patient Time For Goal Achievement: 10/28/18 Potential to Achieve Goals: Fair Progress towards PT goals: Progressing toward goals    Frequency    Min 3X/week      PT Plan  Current plan remains appropriate       AM-PAC PT "6 Clicks" Mobility   Outcome Measure  Help needed turning from your back to your side while in a flat bed without using bedrails?: None Help needed moving from lying on your back to sitting on the side of a flat bed without using bedrails?: None Help needed moving to and from a bed to a chair (including a wheelchair)?: A Little Help needed standing up from a chair using  your arms (e.g., wheelchair or bedside chair)?: A Little Help needed to walk in hospital room?: A Little Help needed climbing 3-5 steps with a railing? : A Lot 6 Click Score: 19    End of Session Equipment Utilized During Treatment: Gait belt Activity Tolerance: Patient limited by fatigue Patient left: in chair;with call bell/phone within reach;with chair alarm set Nurse Communication: Mobility status PT Visit Diagnosis: Unsteadiness on feet (R26.81);Other abnormalities of gait and mobility (R26.89);History of falling (Z91.81);Muscle weakness (generalized) (M62.81);Difficulty in walking, not elsewhere classified (R26.2)     Time: 2518-9842 PT Time Calculation (min) (ACUTE ONLY): 21 min  Charges:  $Gait Training: 8-22 mins                     [place] B. Migdalia Dk PT, DPT Acute Rehabilitation Services Pager [phone] Office [phone]    [place] 10/18/2018, 1:03 PM

## 2018-10-19 ENCOUNTER — Telehealth: Payer: Self-pay | Admitting: Family Medicine

## 2018-10-19 DIAGNOSIS — Z66 Do not resuscitate: Secondary | ICD-10-CM

## 2018-10-19 LAB — CBC
HCT: 41.6 % (ref 36.0–46.0)
Hemoglobin: 13.3 g/dL (ref 12.0–15.0)
MCH: 32.1 pg (ref 26.0–34.0)
MCHC: 32 g/dL (ref 30.0–36.0)
MCV: 100.5 fL — ABNORMAL HIGH (ref 80.0–100.0)
Platelets: 173 10*3/uL (ref 150–400)
RBC: 4.14 MIL/uL (ref 3.87–5.11)
RDW: 15.2 % (ref 11.5–15.5)
WBC: 9.5 10*3/uL (ref 4.0–10.5)
nRBC: 0 % (ref 0.0–0.2)

## 2018-10-19 LAB — COMPREHENSIVE METABOLIC PANEL
ALT: 65 U/L — ABNORMAL HIGH (ref 0–44)
AST: 62 U/L — ABNORMAL HIGH (ref 15–41)
Albumin: 3.3 g/dL — ABNORMAL LOW (ref 3.5–5.0)
Alkaline Phosphatase: 210 U/L — ABNORMAL HIGH (ref 38–126)
Anion gap: 14 (ref 5–15)
BUN: 37 mg/dL — ABNORMAL HIGH (ref 8–23)
CO2: 23 mmol/L (ref 22–32)
Calcium: 9 mg/dL (ref 8.9–10.3)
Chloride: 99 mmol/L (ref 98–111)
Creatinine, Ser: 2.18 mg/dL — ABNORMAL HIGH (ref 0.44–1.00)
GFR calc Af Amer: 25 mL/min — ABNORMAL LOW (ref >60)
GFR calc non Af Amer: 21 mL/min — ABNORMAL LOW (ref >60)
Glucose, Bld: 140 mg/dL — ABNORMAL HIGH (ref 70–99)
Potassium: 4.8 mmol/L (ref 3.5–5.1)
Sodium: 136 mmol/L (ref 135–145)
Total Bilirubin: 1.3 mg/dL — ABNORMAL HIGH (ref 0.3–1.2)
Total Protein: 6.1 g/dL — ABNORMAL LOW (ref 6.5–8.1)

## 2018-10-19 MED ORDER — HYDROXYZINE HCL 10 MG PO TABS
10.0000 mg | ORAL_TABLET | Freq: Once | ORAL | Status: AC
  Administered 2018-10-19: 09:00:00 10 mg via ORAL
  Filled 2018-10-19: qty 1

## 2018-10-19 MED ORDER — AMIODARONE HCL 200 MG PO TABS
400.0000 mg | ORAL_TABLET | Freq: Two times a day (BID) | ORAL | Status: DC
  Administered 2018-10-19 – 2018-10-20 (×2): 400 mg via ORAL
  Filled 2018-10-19 (×2): qty 2

## 2018-10-19 NOTE — TOC Progression Note (Signed)
Transition of Care [facility]) - Progression Note    Patient Details  Name: LEOTTA WEINGARTEN MRN: [id number] Date of Birth: 06-27-1940  Transition of Care [facility]) CM/SW Contact  Graves-Bigelow, Ocie Cornfield, RN Phone Number: 10/19/2018, 10:54 AM  Clinical Narrative: Lasix on hold due to bump in Cr. Tweaking other medications. Plan for home once stable. CM will continue to monitor for additional transition of care needs.       Expected Discharge Plan: [place] Barriers to Discharge: Continued Medical Work up  Expected Discharge Plan and Services Expected Discharge Plan: [place] In-house Referral: NA Discharge Planning Services: CM Consult Post Acute Care Choice: Durable Medical Equipment, Home Health Living arrangements for the past 2 months: Single Family Home                 DME Arranged: Walker rolling DME Agency: [employer] Date DME Agency Contacted: 10/15/18 Time DME Agency Contacted: 9 Representative spoke with at DME Agency: [place] Arranged: RN, Disease Management, PT [place] Agency: [place] ([place]) Date [place]: 10/15/18 Time [place]: 1600 Representative spoke with at [place]: [place] ([place]) Interventions    Readmission Risk Interventions Readmission Risk Prevention Plan 10/18/2018 10/15/2018  Transportation Screening - Complete  HRI or [place] - Complete  Social Work Consult for [place] Planning/Counseling Complete -  Palliative Care Screening - Not Applicable  Medication Review Press photographer) - Complete  Some recent data might be hidden

## 2018-10-19 NOTE — Progress Notes (Signed)
[facility])   Received request from Dr. Sharon Seller for patient interest in hospice services at home after discharge. Confirmed with RNCM Hassan Rowan. Hospice eligibility confirmed by [facility] physician.   Spoke with patient and patient's son by phone to initiate education related to hospice philosophy, services and team approach to care. Patient/family verbalized understanding of information given. Patient is hopeful to return home tomorrow. Per son he plans to pick her up and be with her the rest of the day.   Please send signed and completed DNR form home with patient/family.   Patient will need prescriptions for discharge comfort medications.  Per patient and son the only DME requested is RW which has already been ordered and in room to take home tomorrow. [facility] RN will continue to assess DME needs at home.   [facility] aware of the above and will contact patient/son to schedule admission visit after discharge.   Please call with hospice related questions.  Thank you for this referral.  Heloise Purpura [phone] [place]   [place] are listed on AMION under Hospice and [place].

## 2018-10-19 NOTE — Progress Notes (Addendum)
  Date: 10/19/2018  Patient name: Teresa [name]  Medical record number: [id number]  Date of birth: 1941-03-02   I have seen and evaluated this patient and I have discussed the plan of care with the house staff. Please see their note for complete details. I concur with their findings with the following additions/corrections: Ms. Hajjar was seen this morning on team rounds.  Dr. Sharon Seller conducted a wonderful goals of care discussion with the patient.  The patient has consistently stated she wanted to go home.  Dr. Sharon Seller discussed the 2 options currently available which are to remain in the hospital focusing on intervention and reversing conditions which are reversible.  The other option is to go home with hospice and focus on comfort rather than medical interventions.  The patient understands that at some point she has to face mortality and she is at peace with her god but knows that her death would be very distressing to her family members.  Thinking about her own mortality did make her a little bit anxious.  At the end of the discussion, the patient and Dr. Sharon Seller jointly decided that Dr. Sharon Seller will discuss options with her son and reconnect with Ms Colasanti.  Additionally, Dr. Sharon Seller will discuss reasonable intervention options with cardiology before talking to the son. As the patient is now developing worsening liver dysfunction, renal dysfunction, and hypertension despite aggressive diuresis.  This is concerting for low output heart failure.  Dr. Sharon Seller, the patient, and the patient's son have decided to de-escalate care because of poor prognosis due to the patient's heart failure with likely low output resulting in liver failure and renal failure.  Patient will be discharged once home hospice arrangements can be finalized.  Bartholomew Crews, MD 10/19/2018, 12:32 PM

## 2018-10-19 NOTE — Progress Notes (Signed)
Progress Note  Patient Name: Teresa [name] Date of Encounter: 10/19/2018  Primary Cardiologist: Minus Breeding, MD   Subjective   78 year old female with a history of persistent atrial fibrillation and a nonischemic cardiomyopathy.  She has been started on amiodarone and Toprol-XL.  She has diuresed 1.9 L so far during this admission. Seems a bit confused this am. Thinks that she was told that there is no cure for her condition and that the "end is near" I've reassured here that I did not think that she was close to dying .    Inpatient Medications    Scheduled Meds: . amiodarone  200 mg Oral BID  . apixaban  5 mg Oral BID  . atorvastatin  10 mg Oral Daily  . busPIRone  5 mg Oral BID  . citalopram  20 mg Oral Daily  . levothyroxine  75 mcg Oral QAC breakfast  . metoprolol succinate  100 mg Oral Daily  . pantoprazole  40 mg Oral Daily  . sodium chloride flush  3 mL Intravenous Q12H   Continuous Infusions: . sodium chloride     PRN Meds: sodium chloride, acetaminophen, calcium carbonate, famotidine, HYDROcodone-acetaminophen, levalbuterol, mirtazapine, sodium chloride flush   Vital Signs    Vitals:   10/18/18 1426 10/18/18 2112 10/19/18 0618 10/19/18 0638  BP: (!) 90/57 109/75 119/79   Pulse: (!) 110 93 81   Resp: 18  16   Temp: (!) 97.5 F (36.4 C) (!) 97.4 F (36.3 C) (!) 97.5 F (36.4 C)   TempSrc: Oral Oral Axillary   SpO2: 99% 97% 99%   Weight:   95.5 kg 92.9 kg  Height:        Intake/Output Summary (Last 24 hours) at 10/19/2018 0745 Last data filed at 10/19/2018 1791 Gross per 24 hour  Intake 480 ml  Output 850 ml  Net -370 ml   Last 3 Weights 10/19/2018 10/19/2018 10/18/2018  Weight (lbs) 204 lb 12.9 oz 210 lb 8.6 oz 211 lb 3.2 oz  Weight (kg) 92.9 kg 95.5 kg 95.8 kg      Telemetry    Atrial fib with RVR  - Personally Reviewed  ECG    No new ECG tracing today. - Personally Reviewed  Physical Exam   Physical Exam per MD:    GEN:  elderly  female,  NAD    Neck: No JVD. Cardiac: Irreg. Irreg.   Tachy   Respiratory: Clear to auscultation bilaterally. GI: +BS , non tender  MS: no edema  Neuro:  No focal deficits.  Psych: seems depressed ,   Slightly confused.   Labs    Chemistry Recent Labs  Lab 10/14/18 0349 10/15/18 0320  10/18/18 1108 10/18/18 1535 10/19/18 0359  NA 138 138   < > 134* 136 136  K 3.8 3.8   < > 4.7 5.1 4.8  CL 105 103   < > 97* 98 99  CO2 21* 22   < > 23 25 23   GLUCOSE 111* 106*   < > 126* 108* 140*  BUN 25* 27*   < > 36* 36* 37*  CREATININE 1.47* 1.50*   < > 1.96* 2.06* 2.18*  CALCIUM 8.5* 8.2*   < > 8.8* 8.8* 9.0  PROT 5.7* 5.0*  --   --   --  6.1*  ALBUMIN 3.3* 3.0*  --   --   --  3.3*  AST 37 35  --   --   --  62*  ALT  59* 50*  --   --   --  65*  ALKPHOS 148* 125  --   --   --  210*  BILITOT 1.1 1.4*  --   --   --  1.3*  GFRNONAA 34* 33*   < > 24* 23* 21*  GFRAA 39* 39*   < > 28* 26* 25*  ANIONGAP 12 13   < > 14 13 14    < > = values in this interval not displayed.     Hematology Recent Labs  Lab 10/15/18 0320 10/16/18 0228 10/19/18 0359  WBC 7.5 7.4 9.5  RBC 3.57* 3.59* 4.14  HGB 11.5* 11.5* 13.3  HCT 36.3 36.6 41.6  MCV 101.7* 101.9* 100.5*  MCH 32.2 32.0 32.1  MCHC 31.7 31.4 32.0  RDW 15.1 15.3 15.2  PLT 120* 127* 173    Cardiac Enzymes Recent Labs  Lab 10/13/18 0724  TROPONINI 0.03*   No results for input(s): TROPIPOC in the last 168 hours.   BNP Recent Labs  Lab 10/13/18 0724  BNP 1,262.8*     DDimer No results for input(s): DDIMER in the last 168 hours.   Radiology    No results found.  Cardiac Studies   Echocardiogram 08/13/2018: 1. The left ventricle has moderately reduced systolic function, with an ejection fraction of 35-40%. The cavity size was normal. There is mildly increased left ventricular wall thickness. Left ventricular diastology could not be evaluated secondary to  atrial fibrillation. Left ventricular diffuse hypokinesis.  2. The  right ventricle has normal systolic function. The cavity was normal. There is no increase in right ventricular wall thickness.  3. Left atrial size was moderately dilated.  4. Right atrial size was mildly dilated.  5. The mitral valve is degenerative. Mild thickening of the mitral valve leaflet.  6. The aortic valve was not well visualized Mild calcification of the aortic valve. Aortic valve regurgitation is mild by color flow Doppler. The jet is posteriorly-directed no stenosis of the aortic valve.  7. The aortic root and ascending aorta are normal in size and structure.  Summary: LVEF 35-40%, mild LVH, severe global hypokinesis, moderate LAE, mild RAE, trivail MR, mild aortic valve calcification with mild posteriorly directed AI, IVC not visualized.  Patient Profile   Teresa [name] is a 78 y.o. female with a history of chronic systolic CHF/non-ischemic cardiomyopathy, atrial fibrillation on Eliquis s/p DCCV with conversion to normal sinus rhythm in 08/2018, moderate mitral regurgitation, hypertension, type 2 diabetes mellitus, hypothyroidism, anxiety, and depression, who is being seen today for the evaluation of atrial fibrillation with RVR at the request of Dr. Daryll Drown.  Assessment & Plan    Acute on Chronic Systolic CHF - BNP elevated at 1,262.8. - Diuresing with IV Lasix. Documented output of 1 L in the past 24 hours and net negative 1.89 L since admission. - Will defer additional diuresis to MD who will assess volume status.  - Continue Toprol-XL 100mg  daily.  - No ACEi/ARB due to renal function. - Continue to monitor daily weights, strict I/O's, and renal function.  Persistent Atrial Fibrillation with RVR - Patient s/p TEE/DCCV in 08/2018 but was back in atrial fibrillation on admission. - EP (Dr. Rayann Heman) consulted on 10/17/2018 and started Amiodarone 200mg  twice daily. Also changed Lopressor to Toprol-XL 100mg  daily. - Continue anticoagulation with Eliquis 5mg  twice daily.   AKI on CKD  Stage III - Serum creatinine 2.18 today. - Continue to avoid Nephrotoxic agents. - Will defer additional diuresis to MD.  Otherwise, per primary team.  For questions or updates, please contact [place] Please consult [email or web address] for contact info under        Signed, Darreld Mclean, PA-C  10/19/2018, 7:45 AM    Attending Note:   The patient was seen and examined.  Agree with assessment and plan as noted above.  Changes made to the above note as needed.  Patient seen and independently examined with Sande Rives, PA .   We discussed all aspects of the encounter. I agree with the assessment and plan as stated above.  1.   Atrial fib with RVR : Continue current dose of amiodarone.  Continue Toprol-XL.   Continue Eliquis.  2.  Acute on chronic combined systolic and diastolic congestive heart failure: The patient is diuresed but her creatinine is up slightly.  Lasix is currently on hold.  Continue other medications.  Continue Toprol-XL for better rate control.   I have spent a total of 40 minutes with patient reviewing hospital  notes , telemetry, EKGs, labs and examining patient as well as establishing an assessment and plan that was discussed with the patient. > 50% of time was spent in direct patient care.    Thayer Headings, Brooke Bonito., MD, [facility] 10/19/2018, 8:46 AM [address]  [place] Pager [phone]

## 2018-10-19 NOTE — Telephone Encounter (Signed)
Copied from [place] [id number]. Topic: Quick Communication - See Telephone Encounter >> Oct 19, 2018  4:03 PM Margot Ables wrote: CRM for notification. See Telephone encounter for: 10/19/18.  Callers Name: Danise Mina w/[facility] #: [phone], secure VM OR direct phone # [phone] Reason for call: Pt wanting to d/c from the hospital and the pt requesting Dr. Pamella Pert to be the attending of record. The pt cannot d/c until she has attending of record. Please advise.

## 2018-10-19 NOTE — Progress Notes (Signed)
Physical Therapy Treatment Patient Details Name: Teresa [name] MRN: [id number] DOB: 07/11/1940 Today's Date: 10/19/2018    History of Present Illness 78 y.o. yo female admited 4/29 w/ PMH significant for afib, HFrEF non ischemic, hypothyroidism, anxiety.  Presents for extreme tiredness and shortness of breath on exertion. CXR reveals left pleural effusion. Treatment for CHF, Afib, pleural effusion      PT Comments    Pt is anxious about her illness as well as her inability to remember what has happened over the course of her hospital stay. With constant encouragement pt is able to progress her ambulation and work on energy conservation by slowing her pace and improving her breath work. D/c plans remain appropriate. PT will continue to follow acutely.    Follow Up Recommendations  Home health PT;Supervision/Assistance - 24 hour     Equipment Recommendations  Rolling walker with 5" wheels;3in1 (PT)       Precautions / Restrictions Precautions Precautions: Fall Restrictions Weight Bearing Restrictions: No    Mobility  Bed Mobility Overal bed mobility: Needs Assistance Bed Mobility: Supine to Sit     Supine to sit: HOB elevated;Min guard Sit to supine: Supervision   General bed mobility comments: supervision, HoB elevated, use of UE to move LE to EoB  Transfers Overall transfer level: Needs assistance Equipment used: Rolling walker (2 wheeled);None Transfers: Sit to/from Stand Sit to Stand: Min guard;Min assist         General transfer comment: initial sit>stand requires minA for pt with increased posterior lean, on next attempt pt powered up from bed surface with min guard and was able to steady herself with RW, 3 additional sit>stand with rest breaks during ambulation  Ambulation/Gait Ambulation/Gait assistance: Min guard Gait Distance (Feet): 65 Feet(1x10, 1x25, 1x15, 1x15 ) Assistive device: Rolling walker (2 wheeled) Gait Pattern/deviations: Step-through  pattern;Decreased stride length;Trunk flexed Gait velocity: slowed Gait velocity interpretation: <1.8 ft/sec, indicate of risk for recurrent falls General Gait Details: hands on min guard for safety, vc for proximity to RW, upright posture and decreased UE usage, focus of session on endurance, on initial bout of walking pt started at increased rate which caused her to tire quickly, additional bouts worked on slow steady gait with increased pursed lipped breathing.        Balance Overall balance assessment: Needs assistance Sitting-balance support: Feet supported;No upper extremity supported Sitting balance-Leahy Scale: Fair     Standing balance support: Bilateral upper extremity supported Standing balance-Leahy Scale: Poor Standing balance comment: requires UE support for dynamic balance                            Cognition Arousal/Alertness: Awake/alert Behavior During Therapy: WFL for tasks assessed/performed Overall Cognitive Status: Within Functional Limits for tasks assessed                                           General Comments General comments (skin integrity, edema, etc.): SaO2 with poor waveform and unreliable results with electrode replacement pt ambulating with SaO2 92% on RA. HR max with ambulation 132 bpm      Pertinent Vitals/Pain Pain Assessment: No/denies pain           PT Goals (current goals can now be found in the care plan section) Acute Rehab PT Goals PT Goal Formulation: With patient Time For Goal  Achievement: 10/28/18 Potential to Achieve Goals: Fair Progress towards PT goals: Progressing toward goals    Frequency    Min 3X/week      PT Plan Current plan remains appropriate    Co-evaluation PT/OT/SLP Co-Evaluation/Treatment: Yes Reason for Co-Treatment: For patient/therapist safety PT goals addressed during session: Mobility/safety with mobility;Balance;Proper use of DME        AM-PAC PT "6 Clicks"  Mobility   Outcome Measure  Help needed turning from your back to your side while in a flat bed without using bedrails?: None Help needed moving from lying on your back to sitting on the side of a flat bed without using bedrails?: None Help needed moving to and from a bed to a chair (including a wheelchair)?: A Little Help needed standing up from a chair using your arms (e.g., wheelchair or bedside chair)?: A Little Help needed to walk in hospital room?: A Little Help needed climbing 3-5 steps with a railing? : A Lot 6 Click Score: 19    End of Session Equipment Utilized During Treatment: Gait belt Activity Tolerance: Patient limited by fatigue Patient left: in chair;with call bell/phone within reach;with chair alarm set Nurse Communication: Mobility status PT Visit Diagnosis: Unsteadiness on feet (R26.81);Other abnormalities of gait and mobility (R26.89);History of falling (Z91.81);Muscle weakness (generalized) (M62.81);Difficulty in walking, not elsewhere classified (R26.2)     Time: 5053-9767 PT Time Calculation (min) (ACUTE ONLY): 40 min  Charges:  $Gait Training: 8-22 mins                     Lakevia Perris B. Migdalia Dk PT, DPT Acute Rehabilitation Services Pager [phone] Office [phone]    [place] 10/19/2018, 4:12 PM

## 2018-10-19 NOTE — Progress Notes (Signed)
Occupational Therapy Treatment Patient Details Name: SHEREA LIPTAK MRN: [id number] DOB: January 01, 1941 Today's Date: 10/19/2018    History of present illness 78 y.o. yo female admited 4/29 w/ PMH significant for afib, HFrEF non ischemic, hypothyroidism, anxiety.  Presents for extreme tiredness and shortness of breath on exertion. CXR reveals left pleural effusion. Treatment for CHF, Afib, pleural effusion     OT comments  Pt progressing towards established OT goals. Pt performing grooming at sink with Min A for safety and balance in standing. Pt performing functional mobility in hallway with Min guard A-Min A and RW. Pt presenting with increased fear of falling and decreased activity tolerance. Pt benefiting from increased encouragement for increased participation and pt very thankful at end of session. Continue to recommend pt dc home with HHOT and will continue to follow acutely as admitted    Follow Up Recommendations  Supervision/Assistance - 24 hour;Home health OT    Equipment Recommendations  None recommended by OT    Recommendations for Other Services      Precautions / Restrictions Precautions Precautions: Fall Restrictions Weight Bearing Restrictions: No       Mobility Bed Mobility Overal bed mobility: Needs Assistance Bed Mobility: Supine to Sit     Supine to sit: Supervision;HOB elevated Sit to supine: Supervision   General bed mobility comments: supervision, HoB elevated, use of UE to move LE to EoB  Transfers Overall transfer level: Needs assistance Equipment used: Rolling walker (2 wheeled);None Transfers: Sit to/from Stand Sit to Stand: Min guard;Min assist         General transfer comment: initial sit>stand requires minA for pt with increased posterior lean, on next attempt pt powered up from bed surface with min guard and was able to steady herself with RW, 3 additional sit>stand with rest breaks during ambulation    Balance Overall balance assessment:  Needs assistance Sitting-balance support: Feet supported;No upper extremity supported Sitting balance-Leahy Scale: Fair     Standing balance support: Bilateral upper extremity supported Standing balance-Leahy Scale: Poor Standing balance comment: requires UE support for dynamic balance                           ADL either performed or assessed with clinical judgement   ADL Overall ADL's : Needs assistance/impaired     Grooming: Brushing hair;Wash/dry hands;Minimal assistance;Standing Grooming Details (indicate cue type and reason): Min A for stability, balance, and safety     Lower Body Bathing: Min guard;Sit to/from stand Lower Body Bathing Details (indicate cue type and reason): Long sitting in bed, pt applying lotion to her legs. educating pt in massage for edema and faciltiate retrograde massage.          Toilet Transfer: Min guard;Cueing for sequencing;+2 for safety/equipment;Ambulation;RW;Minimal assistance(simulated to recliner) Toilet Transfer Details (indicate cue type and reason): MinGUard-Min A for safety and power up into standing. Cues for hand placement         Functional mobility during ADLs: Min guard;Rolling walker General ADL Comments: Pt continues to present with decreased activity tolerance and is anxious about falling.     Vision       Perception     Praxis      Cognition Arousal/Alertness: Awake/alert Behavior During Therapy: WFL for tasks assessed/performed Overall Cognitive Status: Within Functional Limits for tasks assessed  Exercises     Shoulder Instructions       General Comments SpO2 with poor waveform and unreliable results with electrode replacement pt ambulating with SpO2 92% on RA. HR max with ambulation 132 bpm    Pertinent Vitals/ Pain       Pain Assessment: No/denies pain  Home Living                                          Prior  Functioning/Environment              Frequency  Min 2X/week        Progress Toward Goals  OT Goals(current goals can now be found in the care plan section)  Progress towards OT goals: Progressing toward goals  Acute Rehab OT Goals Patient Stated Goal: be able to move without fatigue OT Goal Formulation: With patient Time For Goal Achievement: 10/28/18 Potential to Achieve Goals: Good ADL Goals Pt Will Perform Grooming: with supervision;sitting;standing Pt Will Perform Lower Body Dressing: with supervision;sit to/from stand;with adaptive equipment Pt Will Transfer to Toilet: with supervision;ambulating;bedside commode Pt Will Perform Toileting - Clothing Manipulation and hygiene: with supervision;sit to/from stand Additional ADL Goal #1: Pt will be able to independently identify and implement at least 3 energy conservation strategies during ADLs.  Plan Discharge plan remains appropriate    Co-evaluation    PT/OT/SLP Co-Evaluation/Treatment: Yes Reason for Co-Treatment: Complexity of the patient's impairments (multi-system involvement);For patient/therapist safety;To address functional/ADL transfers PT goals addressed during session: Mobility/safety with mobility;Balance;Proper use of DME OT goals addressed during session: ADL's and self-care      AM-PAC OT "6 Clicks" Daily Activity     Outcome Measure   Help from another person eating meals?: None Help from another person taking care of personal grooming?: A Little Help from another person toileting, which includes using toliet, bedpan, or urinal?: A Little Help from another person bathing (including washing, rinsing, drying)?: A Little Help from another person to put on and taking off regular upper body clothing?: A Little Help from another person to put on and taking off regular lower body clothing?: A Little 6 Click Score: 19    End of Session Equipment Utilized During Treatment: Gait belt;Rolling walker  OT  Visit Diagnosis: Unsteadiness on feet (R26.81);History of falling (Z91.81);Muscle weakness (generalized) (M62.81)   Activity Tolerance Patient tolerated treatment well   Patient Left in chair;with call bell/phone within reach;with chair alarm set   Nurse Communication Mobility status        Time: 2423-5361 OT Time Calculation (min): 40 min  Charges: OT General Charges $OT Visit: 1 Visit OT Treatments $Self Care/Home Management : 23-37 mins  [place], OTR/L Acute Rehab Pager: [phone] Office: [place] 10/19/2018, 5:20 PM

## 2018-10-19 NOTE — TOC Progression Note (Signed)
Transition of Care [facility]) - Progression Note    Patient Details  Name: Teresa [name] MRN: [id number] Date of Birth: [birth year]  [facility] CM/SW Contact  Graves-Bigelow, Ocie Cornfield, RN Phone Number: 10/19/2018, 3:58 PM  Clinical Narrative: CM received call from [facility] with Hospice- The physician made the consult. CM was notified of the referral. Patient will now go home with Hospice Services and [facility] to begin within 24-48 hours post transition home. Per [place] patient will only need DME RW in the room via [place]. CM did make [facility] aware that patient will now go home with Hospice Services. CM will continue to monitor for additional transition of care needs.     Expected Discharge Plan: [place] Barriers to Discharge: Continued Medical Work up  Expected Discharge Plan and Services Expected Discharge Plan: [place] In-house Referral: NA Discharge Planning Services: CM Consult Post Acute Care Choice: Durable Medical Equipment, Home Health Living arrangements for the past 2 months: Single Family Home                 DME Arranged: Walker rolling DME Agency: [employer] Date DME Agency Contacted: 10/15/18 Time DME Agency Contacted: 1622 Representative spoke with at DME Agency: [place]: Hospice and [place]: Registered Nurse.   Social Determinants of Health (SDOH) Interventions    Readmission Risk Interventions Readmission Risk Prevention Plan 10/18/2018 10/15/2018  Transportation Screening - Complete  HRI or [place] - Complete  Social Work Consult for [place] Planning/Counseling Complete -  Palliative Care Screening - Not Applicable  Medication Review Press photographer) - Complete  Some recent data might be hidden

## 2018-10-19 NOTE — Progress Notes (Addendum)
Subjective:  Seen on rounds this am Teresa [name] stated that she was ready to go home. We had a long discussion that she was not stable for discharge as her symptoms were continuing to worsen despite medical therapy, and that if she were to go home she would continue to decompensate. I explained that with several days of continued therapy, she may start to improve as her medications were adjusted, that there were still options available to explore treatment of her atrial fibrillation and heart failure, but that improvement was not guaranteed.  She stated she understood but that she was ready to go home regardless, wanted to be with her family, and wanted to enjoy what time she had.  She otherwise stated that she was feeling well this morning, denies sob except on exertion, no chest pain or nausea.   Objective:  Vital signs in last 24 hours: Vitals:   10/18/18 0631 10/18/18 1426 10/18/18 2112 10/19/18 0618  BP: (!) 122/101 (!) 90/57 109/75 119/79  Pulse: (!) 115 (!) 110 93 81  Resp:  18  16  Temp: (!) 97.3 F (36.3 C) (!) 97.5 F (36.4 C) (!) 97.4 F (36.3 C) (!) 97.5 F (36.4 C)  TempSrc: Oral Oral Oral Axillary  SpO2: 98% 99% 97% 99%  Weight: 95.8 kg   95.5 kg  Height:       Constitution: NAD, sitting up in bed, alert and orientedx4 Cardio: irregular rate and rhythm Respiratory: +crackles bilaterally, no wheezing or rales  MSK: +2 LE edema, moving all extremities  Neuro: a&ox4, normal affect, pleasant Skin: c/d/i   Assessment/Plan:  Principal Problem:   Atrial fibrillation with RVR (HCC) Active Problems:   Acute on chronic congestive heart failure (HCC)   AKI (acute kidney injury) (HCC)   Hypomagnesemia   Transaminitis   Thrombocytopenia (HCC)   Atrial Fibrillation with RVR Low Output Systolic Heart Failure Moderate-Severe Mitral Regurge Disposition She continues to have atrial fibrillation with episodes of increased HR when speaking or moving. Bladder scan yesterday  unremarkable. She continues to have fluid overload and is making urine although this continues to be low output despite diuresis and worsening kidney function. She has also developed worsening transaminitus 2/2 low output heart failure. Despite her condition and worsening organ function, she is adamant about wanting to go home. We had a long discussion explaining that there are some possible therapeutic avenues still available but that she needs to be monitored in the hospital to adjust medications to control her heart rate and fluid overload. She understood if she went home it is likely her heart failure would continue to worsen, and she reiterated that wants to go home with comfort care and focus on symptom management and be with her family. Conversation was had with her son as well. Per discussion with him she has no history of dementia or AMS and talking to her today she is alert and oriented and aware of her options and has been throughout admission.   - contacted [facility] - they will plan to see her in the am - increased amiodarone to 400 mg bid - will plan to titrate down likely after discharge and continue to treat for symptoms of atrial fibrillation - cont. toprol 100 mg qd  AKI on CKD Stage III Creatinine continuing to trend upward despite IV diuretics and decreased urine output.   - holding lasix today   VTE: apixaban IVF: none Diet: heart healthy Code: DNR - due to patient wishing to  discharge and transition to hospice care with control of symptoms, code status was once again discussed. She would like to be switched to Do Not Resuscitate and does not want to receive CPR, shock or intubation.   Dispo: Anticipated discharge in approximately today or tomorrow.   Marty Heck, DO 10/19/2018, 6:21 AM Pager: [phone]

## 2018-10-20 ENCOUNTER — Telehealth: Payer: Self-pay

## 2018-10-20 DIAGNOSIS — I5021 Acute systolic (congestive) heart failure: Secondary | ICD-10-CM

## 2018-10-20 LAB — BASIC METABOLIC PANEL
Anion gap: 13 (ref 5–15)
BUN: 34 mg/dL — ABNORMAL HIGH (ref 8–23)
CO2: 23 mmol/L (ref 22–32)
Calcium: 8.9 mg/dL (ref 8.9–10.3)
Chloride: 100 mmol/L (ref 98–111)
Creatinine, Ser: 1.72 mg/dL — ABNORMAL HIGH (ref 0.44–1.00)
GFR calc Af Amer: 33 mL/min — ABNORMAL LOW (ref >60)
GFR calc non Af Amer: 28 mL/min — ABNORMAL LOW (ref >60)
Glucose, Bld: 128 mg/dL — ABNORMAL HIGH (ref 70–99)
Potassium: 4.2 mmol/L (ref 3.5–5.1)
Sodium: 136 mmol/L (ref 135–145)

## 2018-10-20 MED ORDER — FUROSEMIDE 10 MG/ML IJ SOLN
40.0000 mg | Freq: Once | INTRAMUSCULAR | Status: AC
  Administered 2018-10-20: 09:00:00 40 mg via INTRAVENOUS
  Filled 2018-10-20: qty 4

## 2018-10-20 MED ORDER — AMIODARONE HCL 200 MG PO TABS: 200.0000 mg | ORAL_TABLET | Freq: Two times a day (BID) | ORAL | 2 refills | Status: DC

## 2018-10-20 MED ORDER — TORSEMIDE 20 MG PO TABS: 40.0000 mg | ORAL_TABLET | Freq: Every day | ORAL | 2 refills | Status: DC

## 2018-10-20 MED ORDER — METOPROLOL SUCCINATE ER 100 MG PO TB24: 100.0000 mg | ORAL_TABLET | Freq: Every day | ORAL | 2 refills | Status: DC

## 2018-10-20 MED ORDER — HYDROXYZINE HCL 10 MG PO TABS: 10.0000 mg | ORAL_TABLET | Freq: Two times a day (BID) | ORAL | 0 refills | Status: DC | PRN

## 2018-10-20 MED ORDER — MORPHINE SULFATE 20 MG/5ML PO SOLN: 2.5000 mg | ORAL | 0 refills | Status: DC | PRN

## 2018-10-20 MED ORDER — APIXABAN 5 MG PO TABS: 5.0000 mg | ORAL_TABLET | Freq: Two times a day (BID) | ORAL | 2 refills | Status: DC

## 2018-10-20 NOTE — Progress Notes (Signed)
[facility]) hospital liaison note.  Confirmed order of rolling walker to be delivered to patient room prior to discharge. W/C ordered to be delivered to home at a later time.  [place] equipment manager has been notified and will contact [employer] to arrange delivery to the home. Home address has been verified and is correct in the chart. Nairi Oswald is the family member to contact to arrange time of delivery.   Please call with any hospice related questions.     Farrel Gordon, RN, CCM  [facility] Liaison (listed on AMION under [facility])  [phone]

## 2018-10-20 NOTE — Telephone Encounter (Signed)
Pt is being released to hospice, she could not be released until Dr. Pamella Pert approved to be over her records and release notes. Doctor did approve this, she is being scheduled for telemed in one wk

## 2018-10-20 NOTE — Progress Notes (Signed)
  Date: 10/20/2018  Patient name: Teresa [name]  Medical record number: [id number]  Date of birth: [birth year]   I have seen and evaluated this patient and I have discussed the plan of care with the house staff. Please see their note for complete details. I concur with their findings with the following additions/corrections: Teresa [name] was seen this morning on team rounds.  Dr. Sharon Seller coordinated with the patient, her son, cardiology, and outpatient hospice and all arrangements have been finalized for discharge today.  The focus will be on comfort and maintaining the patient in her home.  Bartholomew Crews, MD 10/20/2018, 11:43 AM

## 2018-10-20 NOTE — Progress Notes (Signed)
   Subjective:  She is feeling well this morning and is ready to go home. Denies SOB, chest pain, nausea. Discussed options of continuing new Rx of Eliquis.   Objective:  Vital signs in last 24 hours: Vitals:   10/19/18 1714 10/19/18 2130 10/20/18 0200 10/20/18 0429  BP: 120/73 103/90  109/84  Pulse: 99 (!) 113  (!) 110  Resp:      Temp:  98.8 F (37.1 C)  (!) 97.4 F (36.3 C)  TempSrc:  Oral  Axillary  SpO2: 98%   99%  Weight:   94 kg   Height:       Constitution: NAD, supine in bed  Cardio: irregular rate & rhythm Respiratory: non-labored breathing, on RA MSK: +2 pitting edema, moving all extremities  Neuro: a&o, pleasant  Assessment/Plan:  Principal Problem:   Atrial fibrillation with RVR (HCC) Active Problems:   Acute on chronic congestive heart failure (HCC)   AKI (acute kidney injury) (HCC)   Hypomagnesemia   Transaminitis   Thrombocytopenia (HCC)    Atrial Fibrillation with RVR Low Output Systolic Heart Failure Moderate-Severe Mitral Regurge Disposition - Transitioning to Hospice Discharging with hospice today. Long discussion yesterday with patient as she no longer wants to stay in the hospital and would like to transition to comfort care at home and be with her family. Discussed with cardiology for recommendations in continuing her amiodarone at increased dose vs. decreasing over the next couple of days as we will continue this for symptoms control. She continues to have increasing fluid volume on exam. Lasix were held yesterday for worsening cr. function. I will encourage her to follow-up with EP as I think her cardioversion could still be done for symptom control although it may be unlikely she can get this completed as her HF is worsening and I am not sure how she will do over the following weeks as we were unable to maximize her medical therapy.    - cardiology consulted, appreciate recommendations - decrease amio to 200 mg bid at discharge - continue  toprol 100 mg qd - Lasix IV 40 mg once  - will start 40 mg qd torsemide at discharge - will encourage her to keep her f/u appointment with Dr. Rayann Heman  - she would like to continue Eliquis at discharge - discussed risk of clot vs. Bleeding   AKI on CKD III Labs held today as she is transitioning to comfort. Had ongoing worsening kidney function with diuresis despite continuing to retain. Appears volume up today.   - 40 mg IV lasix now - 40 mg po torsemide at discharge   VTE: eliquis  IVF: none Diet: regular  Code: DNR  Dispo: Anticipated discharge today. Will follow-up with hospice and and place Rx for medications for comfort care.   Marty Heck, DO 10/20/2018, 6:41 AM Pager: [phone]

## 2018-10-20 NOTE — Progress Notes (Signed)
Progress Note  Patient Name: Teresa [name] Date of Encounter: 10/20/2018  Primary Cardiologist: Minus Breeding, MD   Subjective   78 year old female with a history of persistent atrial fibrillation and a nonischemic cardiomyopathy.  She has been started on amiodarone and Toprol-XL.  Spoke with Dr. Sharon Seller. Patient adamant about going home to be with her family and wants to be transitioned to comfort care. Dr. Sharon Seller consulted Palliative Care/Hospice who will hopefully see her today.  Inpatient Medications    Scheduled Meds: . amiodarone  400 mg Oral BID  . apixaban  5 mg Oral BID  . atorvastatin  10 mg Oral Daily  . busPIRone  5 mg Oral BID  . citalopram  20 mg Oral Daily  . furosemide  40 mg Intravenous Once  . levothyroxine  75 mcg Oral QAC breakfast  . metoprolol succinate  100 mg Oral Daily  . pantoprazole  40 mg Oral Daily  . sodium chloride flush  3 mL Intravenous Q12H   Continuous Infusions: . sodium chloride     PRN Meds: sodium chloride, acetaminophen, calcium carbonate, famotidine, HYDROcodone-acetaminophen, levalbuterol, mirtazapine, sodium chloride flush   Vital Signs    Vitals:   10/19/18 1714 10/19/18 2130 10/20/18 0200 10/20/18 0429  BP: 120/73 103/90  109/84  Pulse: 99 (!) 113  (!) 110  Resp:      Temp:  98.8 F (37.1 C)  (!) 97.4 F (36.3 C)  TempSrc:  Oral  Axillary  SpO2: 98%   99%  Weight:   94 kg   Height:        Intake/Output Summary (Last 24 hours) at 10/20/2018 0838 Last data filed at 10/20/2018 0700 Gross per 24 hour  Intake 480 ml  Output 500 ml  Net -20 ml   Last 3 Weights 10/20/2018 10/19/2018 10/19/2018  Weight (lbs) 207 lb 4.8 oz 204 lb 12.9 oz 210 lb 8.6 oz  Weight (kg) 94.031 kg 92.9 kg 95.5 kg      Telemetry    AF with RVR  - Personally Reviewed  ECG    No new ECG tracing today. - Personally Reviewed  Physical Exam   Physical Exam: Blood pressure 109/84, pulse (!) 110, temperature (!) 97.4 F (36.3 C), temperature  source Axillary, resp. rate 16, height 5\' 3"  (1.6 m), weight 94 kg, SpO2 99 %.  GEN:   Elderly , chronically ill appearing female , hard of hearing ,  HEENT: Normal NECK: No JVD; No carotid bruits LYMPHATICS: No lymphadenopathy CARDIAC:  Irreg. Irreg. ,  Tachy  RESPIRATORY:  Clear to auscultation without rales, wheezing or rhonchi  ABDOMEN: Soft, non-tender, non-distended MUSCULOSKELETAL:  No edema; No deformity  SKIN: Warm and dry NEUROLOGIC:  Alert and oriented x 3, anxious     Labs    Chemistry Recent Labs  Lab 10/14/18 0349 10/15/18 0320  10/18/18 1108 10/18/18 1535 10/19/18 0359  NA 138 138   < > 134* 136 136  K 3.8 3.8   < > 4.7 5.1 4.8  CL 105 103   < > 97* 98 99  CO2 21* 22   < > 23 25 23   GLUCOSE 111* 106*   < > 126* 108* 140*  BUN 25* 27*   < > 36* 36* 37*  CREATININE 1.47* 1.50*   < > 1.96* 2.06* 2.18*  CALCIUM 8.5* 8.2*   < > 8.8* 8.8* 9.0  PROT 5.7* 5.0*  --   --   --  6.1*  ALBUMIN  3.3* 3.0*  --   --   --  3.3*  AST 37 35  --   --   --  62*  ALT 59* 50*  --   --   --  65*  ALKPHOS 148* 125  --   --   --  210*  BILITOT 1.1 1.4*  --   --   --  1.3*  GFRNONAA 34* 33*   < > 24* 23* 21*  GFRAA 39* 39*   < > 28* 26* 25*  ANIONGAP 12 13   < > 14 13 14    < > = values in this interval not displayed.     Hematology Recent Labs  Lab 10/15/18 0320 10/16/18 0228 10/19/18 0359  WBC 7.5 7.4 9.5  RBC 3.57* 3.59* 4.14  HGB 11.5* 11.5* 13.3  HCT 36.3 36.6 41.6  MCV 101.7* 101.9* 100.5*  MCH 32.2 32.0 32.1  MCHC 31.7 31.4 32.0  RDW 15.1 15.3 15.2  PLT 120* 127* 173    Cardiac Enzymes No results for input(s): TROPONINI in the last 168 hours. No results for input(s): TROPIPOC in the last 168 hours.   BNP No results for input(s): BNP, PROBNP in the last 168 hours.   DDimer No results for input(s): DDIMER in the last 168 hours.   Radiology    No results found.  Cardiac Studies   Echocardiogram 08/13/2018: 1. The left ventricle has moderately reduced  systolic function, with an ejection fraction of 35-40%. The cavity size was normal. There is mildly increased left ventricular wall thickness. Left ventricular diastology could not be evaluated secondary to  atrial fibrillation. Left ventricular diffuse hypokinesis.  2. The right ventricle has normal systolic function. The cavity was normal. There is no increase in right ventricular wall thickness.  3. Left atrial size was moderately dilated.  4. Right atrial size was mildly dilated.  5. The mitral valve is degenerative. Mild thickening of the mitral valve leaflet.  6. The aortic valve was not well visualized Mild calcification of the aortic valve. Aortic valve regurgitation is mild by color flow Doppler. The jet is posteriorly-directed no stenosis of the aortic valve.  7. The aortic root and ascending aorta are normal in size and structure.  Summary: LVEF 35-40%, mild LVH, severe global hypokinesis, moderate LAE, mild RAE, trivail MR, mild aortic valve calcification with mild posteriorly directed AI, IVC not visualized.  Patient Profile   Ms. Teresa [name] is a 78 y.o. female with a history of chronic systolic CHF/non-ischemic cardiomyopathy, atrial fibrillation on Eliquis s/p DCCV with conversion to normal sinus rhythm in 08/2018, moderate mitral regurgitation, hypertension, type 2 diabetes mellitus, hypothyroidism, anxiety, and depression, who is being seen today for the evaluation of atrial fibrillation with RVR at the request of Dr. Daryll Drown.  Assessment & Plan    Acute on Chronic Systolic CHF - BNP elevated at 1,262.8. - Net negative 1.5 L since admission. - Will defer additional diuresis to MD due to renal function. - Continue Toprol-XL 100mg  daily.  - No ACEi/ARB due to renal function. - Continue to monitor daily weights, strict I/O's, and renal function.  Persistent Atrial Fibrillation with RVR - Patient s/p TEE/DCCV in 08/2018 but was back in atrial fibrillation on admission. - EP (Dr.  Rayann Heman) consulted on 10/17/2018 and started Amiodarone and changed Lopressor to Toprol-XL 100mg  daily. Amiodarone was increased from 200mg  to 400mg  twice daily yesterday. - Continue anticoagulation with Eliquis 5mg  twice daily.   AKI on CKD Stage III -  Serum creatinine 2.18 yesterday. Today's BMET pending. - Continue to avoid Nephrotoxic agents. - Will defer additional diuresis to MD.  **Spoke with Dr. Sharon Seller. Patient is adamant about going home and wants to be transitioned to comfort care. Palliative Care/Hospice will hopefull see patient today. Dr. Sharon Seller asking for recommendation on Amiodarone and Lasix if patient is able to go home. Will discuss with MD.**  Otherwise, per primary team.  For questions or updates, please contact [place] Please consult [email or web address] for contact info under        Signed, Darreld Mclean, PA-C  10/20/2018, 8:38 AM    Attending Note:   The patient was seen and examined.  Agree with assessment and plan as noted above.  Changes made to the above note as needed.  Patient seen and independently examined with Sande Rives, PA .   We discussed all aspects of the encounter. I agree with the assessment and plan as stated above.  1.     AF with persistent RVR.   She is on amiodarone 400 BID. She says she wants to be discharged home with comfort care. Palliative care consult has been called I would reduce amio to 200 mg a day at discharge.   2.   Acute systolic CHF:   Unable to diurese much due to her worsening renal function .  Plans per IM   3.  CKD:   Plans per IM    I have spent a total of 40 minutes with patient reviewing hospital  notes , telemetry, EKGs, labs and examining patient as well as establishing an assessment and plan that was discussed with the patient. > 50% of time was spent in direct patient care.    Thayer Headings, Brooke Bonito., MD, [facility] 10/20/2018, 9:55 AM [address]  [place] Pager [phone]

## 2018-10-20 NOTE — Care Management Important Message (Signed)
Important Message  Patient Details  Name: Teresa [name] MRN: [id number] Date of Birth: 07-15-1940   Medicare Important Message Given:  Yes    [name] 10/20/2018, 10:43 AM

## 2018-10-21 NOTE — Telephone Encounter (Signed)
Left message

## 2018-10-28 ENCOUNTER — Other Ambulatory Visit: Payer: Self-pay | Admitting: Family Medicine

## 2018-10-28 ENCOUNTER — Other Ambulatory Visit: Payer: Self-pay | Admitting: Internal Medicine

## 2018-10-28 NOTE — Telephone Encounter (Signed)
Requested medication (s) are due for refill today: Yes  Requested medication (s) are on the active medication list: Yes  Last refill:  10/20/18  Future visit scheduled: Yes  Notes to clinic:  See request    Requested Prescriptions  Pending Prescriptions Disp Refills   hydrOXYzine (ATARAX/VISTARIL) 10 MG tablet [Pharmacy Med Name: HYDROXYZINE HCL 10 MG TABLET] 8 tablet 0    Sig: TAKE 1 TABLET TWICE DAILY AS NEEDED FOR ANXIETY.     Ear, Nose, and Throat:  Antihistamines Passed - 10/28/2018  9:47 AM      Passed - Valid encounter within last 12 months    Recent Outpatient Visits          2 months ago Hypotension, unspecified hypotension type   Primary Care at [facility], Fenton Malling, MD   4 months ago Hypothyroidism due to acquired atrophy of thyroid   [facility] at [facility], Lilia Argue, MD   10 months ago Anxiety and depression   Primary Care at [facility], Renette Butters, MD   1 year ago Essential (primary) hypertension   Primary Care at [facility], Renette Butters, MD   1 year ago Essential (primary) hypertension   Primary Care at [facility], Renette Butters, MD

## 2018-11-01 ENCOUNTER — Ambulatory Visit (INDEPENDENT_AMBULATORY_CARE_PROVIDER_SITE_OTHER): Payer: Medicare Other | Admitting: Family Medicine

## 2018-11-01 ENCOUNTER — Encounter: Payer: Self-pay | Admitting: Family Medicine

## 2018-11-01 ENCOUNTER — Other Ambulatory Visit: Payer: Self-pay

## 2018-11-01 VITALS — BP 105/72 | HR 110 | Temp 97.5°F | Resp 18 | Ht 63.0 in | Wt 195.0 lb

## 2018-11-01 DIAGNOSIS — I340 Nonrheumatic mitral (valve) insufficiency: Secondary | ICD-10-CM | POA: Diagnosis not present

## 2018-11-01 DIAGNOSIS — E039 Hypothyroidism, unspecified: Secondary | ICD-10-CM

## 2018-11-01 DIAGNOSIS — I4811 Longstanding persistent atrial fibrillation: Secondary | ICD-10-CM

## 2018-11-01 DIAGNOSIS — E876 Hypokalemia: Secondary | ICD-10-CM

## 2018-11-01 DIAGNOSIS — E119 Type 2 diabetes mellitus without complications: Secondary | ICD-10-CM

## 2018-11-01 DIAGNOSIS — Z7409 Other reduced mobility: Secondary | ICD-10-CM

## 2018-11-01 DIAGNOSIS — Z7901 Long term (current) use of anticoagulants: Secondary | ICD-10-CM | POA: Diagnosis not present

## 2018-11-01 DIAGNOSIS — I5022 Chronic systolic (congestive) heart failure: Secondary | ICD-10-CM | POA: Diagnosis not present

## 2018-11-01 NOTE — Progress Notes (Signed)
5/18/202011:18 AM  Teresa [name] September 28, 1940, 78 y.o., female [id number]  Chief Complaint  Patient presents with  . Follow-up    a-fib follow up, since she has been home feels much better. Edema comes and goes and fatigue, takes meds daily no missed doses. Did have fall the morning she was admitted to the hospital bruised right shoulder    HPI:   Patient is a 78 y.o. female with past medical history significant for HTN, diabetes, afib, CHF with mod-sev MR, polymyalgia rheumatica, hypothyroidism, HLP, pancreatic pseudocyst, depression and anxiety who presents today for hosp followup  Patient Care Team: [name], MD as PCP - General (Family Medicine) Minus Breeding, MD as PCP - Cardiology (Cardiology) Minus Breeding, MD as Consulting Physician (Cardiology) Madilyn Hook, DO (Inactive) as Consulting Physician (General Surgery) Hennie Duos, MD as Consulting Physician (Rheumatology)  Admitted from 10/13/2018 - 10/20/2018 Discharge Diagnosis: 1. Atrial Fibrillation with RVR 2. AKI on CKD Stage III 3. Low Output Heart Failure  4. Moderate-Severe Mitral Regurg  Long term care goal - Sent home on hospice Sent home with a 5 wheel rolling walker  afib with RVR-  Started on amiodorane 200mg  BID and increased dose of metoprolol XL 100mg  and eliquis Sees Dr Percival Spanish cards, does not have upcoming appt Lab Results  Component Value Date   TSH 3.240 10/13/2018   CHF 2/2 MR - toresemide was increased to 40mg  once a day  Lab Results  Component Value Date   CREATININE 1.72 (H) 10/20/2018   CREATININE 2.18 (H) 10/19/2018   CREATININE 2.06 (H) 10/18/2018   Lab Results  Component Value Date   HGBA1C 5.4 06/07/2018    Patient here with son He reports that prior to this admission she was not on her cardiac medications for unclear reasons She reports that she is feeling much better Using walker, able to to walk longer distances within the house Appetite has been doing  well Son usually does the cooking They have been monitoring her salt intake Reports normal urination and BM Has a bed side commode  Patient is not wanting to be on needs hospice She is feeling really well She reports that if she got sick again she would go back to the hospital  Fall Risk  11/01/2018 08/10/2018 06/07/2018 12/21/2017 09/09/2017  Falls in the past year? 1 0 0 Yes No  Comment morning she went to hosp, legs gave out - - - -  Number falls in past yr: 0 0 - 1 -  Injury with Fall? 1 - - No -  Comment did bruise sholder - - - -  Risk for fall due to : Impaired balance/gait;Orthopedic patient;Medication side effect - - - -     Depression screen [facility] 2/9 11/01/2018 11/01/2018 08/10/2018  Decreased Interest 0 0 0  Down, Depressed, Hopeless 0 0 0  PHQ - 2 Score 0 0 0  Altered sleeping 0 - 0  Tired, decreased energy 3 - 0  Change in appetite 0 - 0  Feeling bad or failure about yourself  0 - 0  Trouble concentrating 0 - 0  Moving slowly or fidgety/restless 0 - 0  Suicidal thoughts 0 - 0  PHQ-9 Score 3 - 0  Difficult doing work/chores - - Not difficult at all  Some recent data might be hidden   GAD 7 : Generalized Anxiety Score 11/01/2018  Nervous, Anxious, on Edge 1  Control/stop worrying 0  Worry too much - different things 1  Trouble relaxing 0  Restless 0  Easily annoyed or irritable 0  Afraid - awful might happen 1  Total GAD 7 Score 3     Allergies  Allergen Reactions  . Aspirin Other (See Comments)    HX Bleeding Ulcer   . Nsaids Other (See Comments)    Hx of bleeding ulcers  . Tolmetin Rash    Hx of bleeding ulcers    Prior to Admission medications   Medication Sig Start Date End Date Taking? Authorizing Provider  albuterol (PROAIR HFA) 108 (90 Base) MCG/ACT inhaler USE 2 PUFFS EVERY 6 HOURS AS NEEDED FOR SHORTNESS OF BREATH AND WHEEZING. 10/05/18  Yes Stallings, Zoe A, MD  amiodarone (PACERONE) 200 MG tablet Take 1 tablet (200 mg total) by mouth 2 (two) times  daily. 10/20/18  Yes Seawell, Jaimie A, DO  apixaban (ELIQUIS) 5 MG TABS tablet Take 1 tablet (5 mg total) by mouth 2 (two) times daily. 10/20/18  Yes Seawell, Jaimie A, DO  busPIRone (BUSPAR) 5 MG tablet Take 1 tablet (5 mg total) by mouth 2 (two) times daily. 06/07/18  Yes [name], MD  citalopram (CELEXA) 20 MG tablet TAKE 1 TABLET EACH DAY. Patient taking differently: Take 20 mg by mouth daily.  06/28/18  Yes [name], MD  HYDROcodone-acetaminophen (NORCO/VICODIN) 5-325 MG tablet Take 1 tablet by mouth 5 (five) times daily.    Yes [provider]  hydrOXYzine (ATARAX/VISTARIL) 10 MG tablet TAKE 1 TABLET TWICE DAILY AS NEEDED FOR ANXIETY. 10/29/18  Yes Stallings, Zoe A, MD  levothyroxine (SYNTHROID, LEVOTHROID) 75 MCG tablet TAKE 1 TABLET ONCE DAILY BEFORE BREAKFAST. Patient taking differently: Take 75 mcg by mouth daily before breakfast.  11/09/17  Yes Wardell Honour, MD  metoprolol succinate (TOPROL-XL) 100 MG 24 hr tablet Take 1 tablet (100 mg total) by mouth daily. Take with or immediately following a meal. 10/21/18  Yes Seawell, Jaimie A, DO  mirtazapine (REMERON) 7.5 MG tablet TAKE ONE TABLET AT BEDTIME. Patient taking differently: Take 7.5 mg by mouth at bedtime.  08/24/18  Yes [name], MD  morphine 20 MG/5ML solution Take 0.6 mLs (2.4 mg total) by mouth every 4 (four) hours as needed for pain. 10/20/18  Yes Seawell, Jaimie A, DO  Spacer/Aero-Holding Chambers (AEROCHAMBER MV) inhaler Use as instructed 05/01/16  Yes Javier Glazier, MD  torsemide (DEMADEX) 20 MG tablet Take 2 tablets (40 mg total) by mouth daily. 10/20/18  Yes Seawell, Laurell Roof, DO    Past Medical History:  Diagnosis Date  . Abdominal pain   . Anxiety    maintained on Xanax tid for years.  . Arthritis    FEET, KNEES, HANDS  . Atrial fibrillation ([place]) 09/2018  . Chronic systolic CHF (congestive heart failure), NYHA class 2 ([place])    EF 25% 2011 MV,  50-55% echo 6/13  . Diabetes mellitus type  2, diet-controlled ([place])   . GERD (gastroesophageal reflux disease)   . Glaucoma    s/p surgery B in 30s.  [place].  . H/O hiatal hernia   . History of glaucoma   . History of small bowel obstruction 2009 AND NOV 2012  . HTN (hypertension)   . Hypothyroidism   . Left ovarian cyst   . Moderate mitral regurgitation   . Moderate tricuspid regurgitation   . Non-healing surgical wound    ABDOMINAL S/P EXPLORATOY LAPAROTOMY 04-18-2011  . Nonischemic cardiomyopathy ([place]) [facility]    EF is 25% per echo February  2012, non ischemic myoview 12/2009.  EF 50% echo 7/12 and plans for ICD cancelled.   . Peptic ulcer disease   . PVC (premature ventricular contraction)   . Shortness of breath   . Spinal stenosis   . Urge urinary incontinence   . Valvular heart disease    Moderate to severe MR, moderate TR, LAE per echo 7/11  . Wears dentures    upper denture-lower partial    Past Surgical History:  Procedure Laterality Date  . ABDOMINAL HERNIA REPAIR  32 YRS AGO   PERITINITIS  . APPENDECTOMY  1979   RUPTURED  . BENIGN RIGHT BREAST TUMOR REMOVED    . CARDIOVASCULAR STRESS TEST  JULY 2011-  DR HOCHREIN   NO ISCHEMIA  . CARDIOVERSION N/A 08/17/2018   Procedure: CARDIOVERSION;  Surgeon: Sueanne Margarita, MD;  Location: [facility] ENDOSCOPY;  Service: Cardiovascular;  Laterality: N/A;  . EXPLORATOMY LAP. / EXTENSIVE LYSIS ADHESIONS/ SMALL BOWEL RESECTION X2 WITH PRIMARY ANASTOMOSIS X2  04-18-2011   SMALL BOWEL OBSTRUCTION  . GLAUCOMA SURGERY  1978  . HERNIA REPAIR    . INCISION AND DRAINAGE OF WOUND N/A 09/13/2012   Procedure: INCISION  AND DEBRIDEMENT WOUND abdominal wall ;  Surgeon: Rolm Bookbinder, MD;  Location: [place];  Service: General;  Laterality: N/A;  coordination with Dr Migdalia Dk   . KNEE ARTHROSCOPY W/ MENISCECTOMY  06-05-2004  . TEE WITHOUT CARDIOVERSION N/A 08/17/2018   Procedure: TRANSESOPHAGEAL ECHOCARDIOGRAM (TEE);  Surgeon: Sueanne Margarita, MD;  Location[facility] ENDOSCOPY;  Service:  Cardiovascular;  Laterality: N/A;  . THYROIDECTOMY  1978 GOITOR   AND CERVICAL COLD KNIFE CONE BX  . TONSILLECTOMY  AGE 46  . TRANSTHORACIC ECHOCARDIOGRAM  12-27-2010   EF 45-50%/ MODERATE MV REGURG. / LEFT ATRIUM MILDLY DILATED/ MILD TRICUSPID REGURG.  . TUBAL LIGATION    . VENTRAL HERNIA REPAIR  X4  LAST ONE 20 YRS AGO   ABDOMINAL --  EACH ONE IN DIFFERENT AREA  . VENTRAL HERNIA REPAIR  01/29/2012   Procedure: HERNIA REPAIR VENTRAL ADULT;  Surgeon: Theodoro Kos, DO;  Location: [place];  Service: Plastics;  Laterality: N/A;  . WOUND DEBRIDEMENT  09/25/2011   Procedure: DEBRIDEMENT CLOSURE/ABDOMINAL WOUND;  Surgeon: Theodoro Kos, DO;  Location: [place];  Service: Plastics;  Laterality: N/A;  excision of abdominal wound with primary closure    Social History   Tobacco Use  . Smoking status: Former Smoker    Packs/day: 1.50    Years: 45.00    Pack years: 67.50    Types: Cigarettes    Start date: 11/14/1964    Last attempt to quit: 09/22/2010    Years since quitting: 8.1  . Smokeless tobacco: Never Used  . Tobacco comment: quit 2011 or 2012 - Didn't smoke during pregnancies  Substance Use Topics  . Alcohol use: No    Family History  Problem Relation Age of Onset  . Stroke Brother   . Diabetes Brother   . Tuberculosis Father   . Stroke Mother   . Stroke Sister   . Heart disease Sister   . Emphysema Sister   . Diabetes Daughter     Review of Systems  Constitutional: Negative for chills and fever.  Respiratory: Negative for cough and shortness of breath.   Cardiovascular: Positive for leg swelling. Negative for chest pain and palpitations.  Gastrointestinal: Negative for abdominal pain, nausea and vomiting.  Endo/Heme/Allergies: Does not bruise/bleed easily.  Psychiatric/Behavioral: Negative for depression. The patient does  not have insomnia.    Per hpi  OBJECTIVE:  Today's Vitals   11/01/18 1042  BP: 105/72  Pulse: (!) 110  Resp:  18  Temp: (!) 97.5 F (36.4 C)  SpO2: 97%  Weight: 195 lb (88.5 kg)  Height: 5\' 3"  (1.6 m)   Body mass index is 36.67 kg/m.  Pulse Readings from Last 3 Encounters:  11/01/18 (!) 110  10/20/18 (!) 110  08/17/18 (!) 58    Wt Readings from Last 3 Encounters:  11/01/18 195 lb (88.5 kg)  10/20/18 207 lb 4.8 oz (94 kg)  08/17/18 213 lb 11.2 oz (96.9 kg)    Physical Exam Vitals signs and nursing note reviewed.  Constitutional:      Appearance: She is well-developed.  HENT:     Head: Normocephalic and atraumatic.     Mouth/Throat:     Pharynx: No oropharyngeal exudate.  Eyes:     General: No scleral icterus.    Conjunctiva/sclera: Conjunctivae normal.     Pupils: Pupils are equal, round, and reactive to light.  Neck:     Musculoskeletal: Neck supple.  Cardiovascular:     Rate and Rhythm: Normal rate. Rhythm irregularly irregular.     Heart sounds: Murmur present. No friction rub. No gallop.   Pulmonary:     Effort: Pulmonary effort is normal.     Breath sounds: Normal breath sounds. No wheezing or rales.  Musculoskeletal:     Right lower leg: 1+ Pitting Edema present.     Left lower leg: 1+ Pitting Edema present.  Skin:    General: Skin is warm and dry.  Neurological:     Mental Status: She is alert and oriented to person, place, and time.      ASSESSMENT and PLAN  1. Chronic systolic CHF (congestive heart failure), NYHA class 2 (H[facility]) - Comprehensive metabolic panel - Ambulatory referral to Home Health  2. Moderate mitral regurgitation - Ambulatory referral to Home Health  3. Longstanding persistent atrial fibrillation - Ambulatory referral to Home Health  4. Anticoagulant long-term use - CBC - Ambulatory referral to Home Health  5. Hypothyroidism, unspecified type - TSH  6. Diabetes mellitus type 2, diet-controlled ([facility]) - Microalbumin / creatinine urine ratio  7. Impaired mobility - Ambulatory referral to Home Health  Patient is overall  improving. Weight is down, having more endurance. Declines hospice at this time. She is interested in home health to assist with monitoring edema/fluid status and PT for strength and ADLs.   I have advised her to make an appt with cardiology after her hospitalization and address any concerns about cardioversion with them.  Return in about 6 weeks (around 12/13/2018).    [name], MD Primary Care at [place] [place], [address] [address] Ph.  [phone] Fax [phone]

## 2018-11-01 NOTE — Patient Instructions (Addendum)
Please make appointment with cardiology. thanks    If you have lab work done today you will be contacted with your lab results within the next 2 weeks.  If you have not heard from [place] then please contact us. The fastest way to get your results is to register for My Chart.   IF you received an x-ray today, you will receive an invoice from [facility]. Please contact [facility] at [phone] with questions or concerns regarding your invoice.      IF you received labwork today, you will receive an invoice from [facility]. Please contact [facility] at [phone] with questions or concerns regarding your invoice.   Our billing staff will not be able to assist you with questions regarding bills from these companies.  You will be contacted with the lab results as soon as they are available. The fastest way to get your results is to activate your My Chart account. Instructions are located on the last page of this paperwork. If you have not heard from [place] regarding the results in 2 weeks, please contact this office.

## 2018-11-02 LAB — COMPREHENSIVE METABOLIC PANEL
ALT: 20 IU/L (ref 0–32)
AST: 25 IU/L (ref 0–40)
Albumin/Globulin Ratio: 1.8 (ref 1.2–2.2)
Albumin: 4 g/dL (ref 3.7–4.7)
Alkaline Phosphatase: 122 IU/L — ABNORMAL HIGH (ref 39–117)
BUN/Creatinine Ratio: 15 (ref 12–28)
BUN: 25 mg/dL (ref 8–27)
Bilirubin Total: 0.5 mg/dL (ref 0.0–1.2)
CO2: 24 mmol/L (ref 20–29)
Calcium: 8 mg/dL — ABNORMAL LOW (ref 8.7–10.3)
Chloride: 97 mmol/L (ref 96–106)
Creatinine, Ser: 1.71 mg/dL — ABNORMAL HIGH (ref 0.57–1.00)
GFR calc Af Amer: 33 mL/min/{1.73_m2} — ABNORMAL LOW (ref >59)
GFR calc non Af Amer: 28 mL/min/{1.73_m2} — ABNORMAL LOW (ref >59)
Globulin, Total: 2.2 g/dL (ref 1.5–4.5)
Glucose: 121 mg/dL — ABNORMAL HIGH (ref 65–99)
Potassium: 3.1 mmol/L — ABNORMAL LOW (ref 3.5–5.2)
Sodium: 141 mmol/L (ref 134–144)
Total Protein: 6.2 g/dL (ref 6.0–8.5)

## 2018-11-02 LAB — CBC
Hematocrit: 41.5 % (ref 34.0–46.6)
Hemoglobin: 13.2 g/dL (ref 11.1–15.9)
MCH: 31.4 pg (ref 26.6–33.0)
MCHC: 31.8 g/dL (ref 31.5–35.7)
MCV: 99 fL — ABNORMAL HIGH (ref 79–97)
Platelets: 199 10*3/uL (ref 150–450)
RBC: 4.21 x10E6/uL (ref 3.77–5.28)
RDW: 13.9 % (ref 11.7–15.4)
WBC: 7.2 10*3/uL (ref 3.4–10.8)

## 2018-11-02 LAB — TSH: TSH: 8.63 u[IU]/mL — ABNORMAL HIGH (ref 0.450–4.500)

## 2018-11-02 LAB — MICROALBUMIN / CREATININE URINE RATIO
Creatinine, Urine: 35.3 mg/dL
Microalb/Creat Ratio: 172 mg/g creat — ABNORMAL HIGH (ref 0–29)
Microalbumin, Urine: 60.6 ug/mL

## 2018-11-03 MED ORDER — LEVOTHYROXINE SODIUM 100 MCG PO TABS: ORAL_TABLET | ORAL | 1 refills | Status: DC

## 2018-11-03 MED ORDER — POTASSIUM CHLORIDE ER 10 MEQ PO CPCR: 10.0000 meq | ORAL_CAPSULE | Freq: Two times a day (BID) | ORAL | 4 refills | Status: DC

## 2018-11-03 NOTE — Addendum Note (Signed)
Addended by: [name] on: 11/03/2018 01:11 PM   Modules accepted: Orders

## 2018-11-04 ENCOUNTER — Other Ambulatory Visit: Payer: Self-pay | Admitting: Family Medicine

## 2018-11-05 ENCOUNTER — Telehealth: Payer: Self-pay | Admitting: Family Medicine

## 2018-11-05 NOTE — Telephone Encounter (Signed)
Copied from [place] [phone] [id number]. Topic: Quick Communication - See Telephone Encounter >> Nov 05, 2018 11:31 AM Sheran Luz wrote: CRM for notification. See Telephone encounter for: 11/05/18.  Katie, with hospice, calling requesting a call back from [place] to discuss office notes from visit on 5/18. She specifically would like to know if there was a plan to start [facility] services for patient. Please advise.

## 2018-11-05 NOTE — Telephone Encounter (Signed)
rx needed for pt to keep walker and wheelchair. She is not wanting to use hospice services, does want to keep equipment

## 2018-11-05 NOTE — Telephone Encounter (Signed)
Pt has revoked her hospice  services but pt would like to keep her walker and wheelchair. We need to order this equipment through [facility] or she will lose these.

## 2018-11-10 ENCOUNTER — Telehealth: Payer: Self-pay

## 2018-11-10 NOTE — Telephone Encounter (Signed)
Hand written rx ready to be faxed.

## 2018-11-10 NOTE — Telephone Encounter (Signed)
Patient's son called to check the status on his request for the doctor to send an order for his mother to keep the wheel chair she has.  He is afraid that they will come and get the wheelchair and his mother will not have a way to get around.  Please respond and let family know.  CB# [phone]

## 2018-11-10 NOTE — Telephone Encounter (Signed)
Pts son called back with fax number.   Fax # [phone]

## 2018-11-10 NOTE — Telephone Encounter (Signed)
Rx faxed to [phone], receipt confirmed

## 2018-11-10 NOTE — Telephone Encounter (Signed)
Referral was made to [facility] at time of visit. thanks

## 2018-11-15 ENCOUNTER — Telehealth: Payer: Self-pay | Admitting: Family Medicine

## 2018-11-15 NOTE — Telephone Encounter (Signed)
This rx was filled by a historical provider but you have an apt with her on 6.12.2020 is this something you would be willing to fill so she can make it to her apt?

## 2018-11-15 NOTE — Telephone Encounter (Signed)
Pt needs appt 11/26/2018 pt has enough HYDROcodone-acetaminophen (NORCO/VICODIN) 5-325 MG tablet [[id number]]  For 3 days / can she get refills to last until appt   FR

## 2018-11-17 NOTE — Telephone Encounter (Signed)
Spoke with pt about Rx request and informed her to give [place] a call back at the office if she could not get an appointment with pain management. She verbalized understanding.

## 2018-11-17 NOTE — Telephone Encounter (Signed)
Per pmp review this medication is prescribed by pain management, She needs to request refills from them. Thanks.

## 2018-11-18 NOTE — Telephone Encounter (Signed)
Patient states that she does not go to pain management anymore, and is requesting 32 pills. She takes 4 a day.  Or if she could possibly get an earlier appt. Call back # [place], [place] [phone] (Phone) [phone] (Fax)    Call back # [phone]

## 2018-11-19 NOTE — Telephone Encounter (Signed)
Pt calling back to check status. Pt states that she would like enough medication until appointment next Friday (11/26/18). Please advise   Cb#[phone]

## 2018-11-22 ENCOUNTER — Telehealth: Payer: Self-pay | Admitting: Family Medicine

## 2018-11-22 NOTE — Telephone Encounter (Signed)
Spoke with pt and advise her of providers recommendations about pain med, she verbalized understanding. She has an appointment on 11/26/2018 with provider to discuses medication.

## 2018-11-22 NOTE — Telephone Encounter (Signed)
Copied from [place] [phone]. Topic: Quick Communication - Rx Refill/Question >> Nov 22, 2018 10:24 AM Robina Ade, Helene Kelp D wrote: Medication:hydrocodone-acetaminophen (NORCO/VICODIN) 5-325 MG tablet  Has the patient contacted their pharmacy? Yes (Agent: If no, request that the patient contact the pharmacy for the refill.) (Agent: If yes, when and what did the pharmacy advise?)  Preferred Pharmacy (with phone number or street name): [place], [place].  Agent: Please be advised that RX refills may take up to 3 business days. We ask that you follow-up with your pharmacy.

## 2018-11-26 ENCOUNTER — Telehealth: Payer: Self-pay | Admitting: Family Medicine

## 2018-11-26 ENCOUNTER — Ambulatory Visit: Payer: Self-pay

## 2018-11-26 ENCOUNTER — Telehealth (INDEPENDENT_AMBULATORY_CARE_PROVIDER_SITE_OTHER): Payer: Medicare Other | Admitting: Family Medicine

## 2018-11-26 ENCOUNTER — Other Ambulatory Visit: Payer: Self-pay

## 2018-11-26 DIAGNOSIS — E119 Type 2 diabetes mellitus without complications: Secondary | ICD-10-CM

## 2018-11-26 DIAGNOSIS — E876 Hypokalemia: Secondary | ICD-10-CM | POA: Diagnosis not present

## 2018-11-26 DIAGNOSIS — I5022 Chronic systolic (congestive) heart failure: Secondary | ICD-10-CM | POA: Diagnosis not present

## 2018-11-26 DIAGNOSIS — E034 Atrophy of thyroid (acquired): Secondary | ICD-10-CM

## 2018-11-26 DIAGNOSIS — Z7409 Other reduced mobility: Secondary | ICD-10-CM

## 2018-11-26 MED ORDER — POTASSIUM CHLORIDE ER 10 MEQ PO CPCR: 10.0000 meq | ORAL_CAPSULE | Freq: Two times a day (BID) | ORAL | 4 refills | Status: DC

## 2018-11-26 NOTE — Progress Notes (Signed)
Virtual Visit Note  I connected with@ on 11/26/18 at 315pm by phone and verified that I am speaking with the correct person using two identifiers. Teresa [name] is currently located at home and patient is currently with them during visit. The provider, [name], MD is located in their office at time of visit.  I discussed the limitations, risks, security and privacy concerns of performing an evaluation and management service by telephone and the availability of in person appointments. I also discussed with the patient that there may be a patient responsible charge related to this service. The patient expressed understanding and agreed to proceed.   CC: followup  HPI ? Patient is a 78 y.o. female with past medical history significant for HTN, diabetes, afib, CHF with mod-sev MR, polymyalgia rheumatica, hypothyroidism, HLP, pancreatic pseudocyst, depression and anxiety who presents today for follouwp  Last seen in May for hosp followup Referred to [facility] as patient did not want to do hospice Increased her thyroid medication  She reports that she has been overall doing well She reports Home health has not come out to her home She reports that she has her wheelchair and is able to get out more as she is unable to walk to much causes DOE She reports occasional leg edema in the morning, resolves when she takes her torsemide She reports she takes it every day She does not check her glucose, BP or weight Her son comes by and checks on her, he manages her medications  Denies any chest pain, palpitations, cough Reports she is sleeping well, using one pillow, in her bed Appetite getting better, reports normal bowel Urinating well  Sees cardiologist in august  Lab Results  Component Value Date   CREATININE 1.71 (H) 11/01/2018   BUN 25 11/01/2018   NA 141 11/01/2018   K 3.1 (L) 11/01/2018   CL 97 11/01/2018   CO2 24 11/01/2018   Lab Results  Component Value Date   CREATININE  1.71 (H) 11/01/2018   CREATININE 1.72 (H) 10/20/2018   CREATININE 2.18 (H) 10/19/2018    Lab Results  Component Value Date   HGBA1C 5.4 06/07/2018   Lab Results  Component Value Date   TSH 8.630 (H) 11/01/2018    Allergies  Allergen Reactions  . Aspirin Other (See Comments)    HX Bleeding Ulcer   . Nsaids Other (See Comments)    Hx of bleeding ulcers  . Tolmetin Rash    Hx of bleeding ulcers    Prior to Admission medications   Medication Sig Start Date End Date Taking? Authorizing Provider  albuterol (PROAIR HFA) 108 (90 Base) MCG/ACT inhaler USE 2 PUFFS EVERY 6 HOURS AS NEEDED FOR SHORTNESS OF BREATH AND WHEEZING. 10/05/18   Forrest Moron, MD  amiodarone (PACERONE) 200 MG tablet Take 1 tablet (200 mg total) by mouth 2 (two) times daily. 10/20/18   Seawell, Jaimie A, DO  apixaban (ELIQUIS) 5 MG TABS tablet Take 1 tablet (5 mg total) by mouth 2 (two) times daily. 10/20/18   Seawell, Jaimie A, DO  busPIRone (BUSPAR) 5 MG tablet Take 1 tablet (5 mg total) by mouth 2 (two) times daily. 06/07/18   [name], MD  citalopram (CELEXA) 20 MG tablet TAKE 1 TABLET EACH DAY. Patient taking differently: Take 20 mg by mouth daily.  06/28/18   [name], MD  HYDROcodone-acetaminophen (NORCO/VICODIN) 5-325 MG tablet Take 1 tablet by mouth 5 (five) times daily.  [provider]  hydrOXYzine (ATARAX/VISTARIL) 10 MG tablet TAKE 1 TABLET TWICE DAILY AS NEEDED FOR ANXIETY. 11/04/18   [name], MD  levothyroxine (SYNTHROID) 100 MCG tablet TAKE 1 TABLET ONCE DAILY BEFORE BREAKFAST. 11/03/18   [name], MD  metoprolol succinate (TOPROL-XL) 100 MG 24 hr tablet Take 1 tablet (100 mg total) by mouth daily. Take with or immediately following a meal. 10/21/18   Seawell, Jaimie A, DO  mirtazapine (REMERON) 7.5 MG tablet TAKE ONE TABLET AT BEDTIME. Patient taking differently: Take 7.5 mg by mouth at bedtime.  08/24/18   [name], MD  potassium chloride (MICRO-K) 10  MEQ CR capsule Take 1 capsule (10 mEq total) by mouth 2 (two) times daily. 11/03/18   [name], MD  Spacer/Aero-Holding Chambers (AEROCHAMBER MV) inhaler Use as instructed 05/01/16   Javier Glazier, MD  torsemide (DEMADEX) 20 MG tablet Take 2 tablets (40 mg total) by mouth daily. 10/20/18   Seawell, Laurell Roof, DO    Past Medical History:  Diagnosis Date  . Abdominal pain   . Anxiety    maintained on Xanax tid for years.  . Arthritis    FEET, KNEES, HANDS  . Atrial fibrillation ([place]) 09/2018  . Chronic systolic CHF (congestive heart failure), NYHA class 2 ([place])    EF 25% 2011 MV,  50-55% echo 6/13  . Diabetes mellitus type 2, diet-controlled ([place])   . GERD (gastroesophageal reflux disease)   . Glaucoma    s/p surgery B in 30s.  [place].  . H/O hiatal hernia   . History of glaucoma   . History of small bowel obstruction 2009 AND NOV 2012  . HTN (hypertension)   . Hypothyroidism   . Left ovarian cyst   . Moderate mitral regurgitation   . Moderate tricuspid regurgitation   . Non-healing surgical wound    ABDOMINAL S/P EXPLORATOY LAPAROTOMY 04-18-2011  . Nonischemic cardiomyopathy ([place]) DR [facility]    EF is 25% per echo February 2012, non ischemic myoview 12/2009.  EF 50% echo 7/12 and plans for ICD cancelled.   . Peptic ulcer disease   . PVC (premature ventricular contraction)   . Shortness of breath   . Spinal stenosis   . Urge urinary incontinence   . Valvular heart disease    Moderate to severe MR, moderate TR, LAE per echo 7/11  . Wears dentures    upper denture-lower partial    Past Surgical History:  Procedure Laterality Date  . ABDOMINAL HERNIA REPAIR  32 YRS AGO   PERITINITIS  . APPENDECTOMY  1979   RUPTURED  . BENIGN RIGHT BREAST TUMOR REMOVED    . CARDIOVASCULAR STRESS TEST  JULY 2011-  DR HOCHREIN   NO ISCHEMIA  . CARDIOVERSION N/A 08/17/2018   Procedure: CARDIOVERSION;  Surgeon: Sueanne Margarita, MD;  Location: [facility] ENDOSCOPY;  Service: Cardiovascular;   Laterality: N/A;  . EXPLORATOMY LAP. / EXTENSIVE LYSIS ADHESIONS/ SMALL BOWEL RESECTION X2 WITH PRIMARY ANASTOMOSIS X2  04-18-2011   SMALL BOWEL OBSTRUCTION  . GLAUCOMA SURGERY  1978  . HERNIA REPAIR    . INCISION AND DRAINAGE OF WOUND N/A 09/13/2012   Procedure: INCISION  AND DEBRIDEMENT WOUND abdominal wall ;  Surgeon: Rolm Bookbinder, MD;  Location: [place];  Service: General;  Laterality: N/A;  coordination with Dr Migdalia Dk   . KNEE ARTHROSCOPY W/ MENISCECTOMY  06-05-2004  . TEE WITHOUT CARDIOVERSION N/A 08/17/2018   Procedure: TRANSESOPHAGEAL ECHOCARDIOGRAM (TEE);  Surgeon: Sueanne Margarita,  MD;  Location: [place];  Service: Cardiovascular;  Laterality: N/A;  . THYROIDECTOMY  1978 GOITOR   AND CERVICAL COLD KNIFE CONE BX  . TONSILLECTOMY  AGE 86  . TRANSTHORACIC ECHOCARDIOGRAM  12-27-2010   EF 45-50%/ MODERATE MV REGURG. / LEFT ATRIUM MILDLY DILATED/ MILD TRICUSPID REGURG.  . TUBAL LIGATION    . VENTRAL HERNIA REPAIR  X4  LAST ONE 20 YRS AGO   ABDOMINAL --  EACH ONE IN DIFFERENT AREA  . VENTRAL HERNIA REPAIR  01/29/2012   Procedure: HERNIA REPAIR VENTRAL ADULT;  Surgeon: Theodoro Kos, DO;  Location: [place];  Service: Plastics;  Laterality: N/A;  . WOUND DEBRIDEMENT  09/25/2011   Procedure: DEBRIDEMENT CLOSURE/ABDOMINAL WOUND;  Surgeon: Theodoro Kos, DO;  Location: [place];  Service: Plastics;  Laterality: N/A;  excision of abdominal wound with primary closure    Social History   Tobacco Use  . Smoking status: Former Smoker    Packs/day: 1.50    Years: 45.00    Pack years: 67.50    Types: Cigarettes    Start date: 11/14/1964    Quit date: 09/22/2010    Years since quitting: 8.1  . Smokeless tobacco: Never Used  . Tobacco comment: quit 2011 or 2012 - Didn't smoke during pregnancies  Substance Use Topics  . Alcohol use: No    Family History  Problem Relation Age of Onset  . Stroke Brother   . Diabetes Brother   . Tuberculosis Father   .  Stroke Mother   . Stroke Sister   . Heart disease Sister   . Emphysema Sister   . Diabetes Daughter     ROS Per hpi  Objective  Vitals as reported by the patient: none   ASSESSMENT and PLAN  1. Chronic systolic CHF (congestive heart failure), NYHA class 2 (HCC) Stable. Continue current meds. Will investigate [facility] referral as she does require monitoring at home given recent hospitalizations for decompensation.   2. Diabetes mellitus type 2, diet-controlled ([place]) At goal  3. Hypokalemia On replacement, recheck labs in 1 week  4. Impaired mobility Doing well with wheelchair for when out of the house  5. Hypothyroidism due to acquired atrophy of thyroid - TSH; Future x 8 weeks  Other orders - potassium chloride (MICRO-K) 10 MEQ CR capsule; Take 1 capsule (10 mEq total) by mouth 2 (two) times daily.   FOLLOW-UP: 3 months   The above assessment and management plan was discussed with the patient. The patient verbalized understanding of and has agreed to the management plan. Patient is aware to call the clinic if symptoms persist or worsen. Patient is aware when to return to the clinic for a follow-up visit. Patient educated on when it is appropriate to go to the emergency department.    I provided 19 minutes of non-face-to-face time during this encounter.  [name], MD Primary Care at [facility] [address], [place] [address] Ph.  [phone] Fax [phone]

## 2018-11-26 NOTE — Telephone Encounter (Signed)
SENT MESSAGE TO [facility]

## 2018-11-26 NOTE — Telephone Encounter (Signed)
Pt called statin that she has a rash on her feet that she noticed this am.  She thought it was from her slippers. Now the rash is on the top of both feet.  She say it lays in clusters that go across the top if her foot.  She thinks it may be going up her left leg.  She denies itching and pain.  She states the rash is not on her arms or abdomin.  Her tongue is not swollen .She states the rash is red and maybe a little purple. She has not taken any new medications or eaten anything different. Care advice read to patient. Patient verbalized understanding of instructions. Pt was asked to seek care at urgent care. Pt states she will try to find someone who can take her. Pt advised to seek immediate care if her lips or tongue swell. Reason for Disposition . [1] Localized purple or blood-colored spots or dots AND [2] not from injury or friction AND [3] no fever  Answer Assessment - Initial Assessment Questions 1. APPEARANCE of RASH: "Describe the rash."      Flat rash red 2. LOCATION: "Where is the rash located?"      feet 3. NUMBER: "How many spots are there?"      Top of feet clusters 4. SIZE: "How big are the spots?" (Inches, centimeters or compare to size of a coin)      All across feet 5. ONSET: "When did the rash start?"    today 6. ITCHING: "Does the rash itch?" If so, ask: "How bad is the itch?"  (Scale 1-10; or mild, moderate, severe)    None 7. PAIN: "Does the rash hurt?" If so, ask: "How bad is the pain?"  (Scale 1-10; or mild, moderate, severe)     No 8. OTHER SYMPTOMS: "Do you have any other symptoms?" (e.g., fever)     no 9. PREGNANCY: "Is there any chance you are pregnant?" "When was your last menstrual period?"    N/A  Protocols used: RASH OR REDNESS - LOCALIZED-A-AH

## 2018-11-26 NOTE — Progress Notes (Signed)
Needs refill on potassium only. She is having edema in the feet. Says there is no leakage or pain at this time. She is taking anxiety which aid very well. She completed gad 7 and phq9 forms score 0

## 2018-11-30 ENCOUNTER — Other Ambulatory Visit: Payer: Self-pay | Admitting: Family Medicine

## 2018-11-30 ENCOUNTER — Telehealth: Payer: Self-pay | Admitting: Family Medicine

## 2018-11-30 NOTE — Telephone Encounter (Signed)
Please advise 

## 2018-11-30 NOTE — Telephone Encounter (Signed)
Caller/Agency: [facility] Number: [phone] Requesting OT/PT/Skilled Nursing/Social Work/Speech Therapy: Pt has small areas of redness on both of her feet. Itchy, not warm, not swollen. Skilled Nursing visit requests Once a week for 4 weeks

## 2018-12-01 ENCOUNTER — Other Ambulatory Visit: Payer: Self-pay

## 2018-12-01 ENCOUNTER — Ambulatory Visit (INDEPENDENT_AMBULATORY_CARE_PROVIDER_SITE_OTHER): Payer: Medicare Other | Admitting: Family Medicine

## 2018-12-01 ENCOUNTER — Encounter: Payer: Self-pay | Admitting: Family Medicine

## 2018-12-01 ENCOUNTER — Telehealth: Payer: Self-pay | Admitting: Cardiology

## 2018-12-01 VITALS — BP 134/86 | HR 115 | Temp 97.6°F | Wt 195.0 lb

## 2018-12-01 DIAGNOSIS — I5022 Chronic systolic (congestive) heart failure: Secondary | ICD-10-CM | POA: Diagnosis not present

## 2018-12-01 DIAGNOSIS — F5104 Psychophysiologic insomnia: Secondary | ICD-10-CM | POA: Diagnosis not present

## 2018-12-01 DIAGNOSIS — I4811 Longstanding persistent atrial fibrillation: Secondary | ICD-10-CM

## 2018-12-01 DIAGNOSIS — E876 Hypokalemia: Secondary | ICD-10-CM | POA: Diagnosis not present

## 2018-12-01 DIAGNOSIS — N184 Chronic kidney disease, stage 4 (severe): Secondary | ICD-10-CM

## 2018-12-01 MED ORDER — FAMOTIDINE 20 MG PO TABS: 20.0000 mg | ORAL_TABLET | Freq: Every day | ORAL | 0 refills | Status: DC

## 2018-12-01 NOTE — Progress Notes (Signed)
6/17/202011:05 AM  Teresa [name] 1940/08/03, 78 y.o., female [id number]  Chief Complaint  Patient presents with  . Rash    both feet have a red irritation not sure of onset. Thought it was a allergic reaction due to the itch,   . Hypothyroidism  . medication    son is concerned about the meds, he counts 8 pills per morning and 5 at night, the count is off per what the list says . List was reveiwed, not sure of the exta pill    HPI:   Patient is a 78 y.o. female with past medical history significant for HTN, diabetes, afib, CHF with mod-sev MR, polymyalgia rheumatica, hypothyroidism, HLP, pancreatic pseudocyst, depression and anxiety who presents today for routine followup  Last visit a week ago Here with her son who provides part of history Hard of hearing - cant afford hearing aides  Reports that leg swelling is resolved Has been having itchy red rash on her feet, OTC hydrocortisone, started after wearing tight shoes during edema  Home health reports HR 130s, patient denies feeling any palpitations, chest pain, SOB, dizziness, pre-syncope Walking significantly more within the house, DOE much improved  Having morning nausea, used to take gerd medication Affecting her appetite in the morning Denies any reflux, bloating, abd pain, vomiting, diarrhea, constipation, black tarry stools  Sleeping well  Fall Risk  12/01/2018 12/01/2018 11/26/2018 11/01/2018 08/10/2018  Falls in the past year? 0 0 0 1 0  Comment - - - morning she went to hosp, legs gave out -  Number falls in past yr: 0 0 0 0 0  Injury with Fall? 0 0 0 1 -  Comment - - - did bruise sholder -  Risk for fall due to : - - - Impaired balance/gait;Orthopedic patient;Medication side effect -     Depression screen [facility] 2/9 12/01/2018 11/26/2018 11/01/2018  Decreased Interest 0 0 0  Down, Depressed, Hopeless 0 0 0  PHQ - 2 Score 0 0 0  Altered sleeping - 0 0  Tired, decreased energy - 0 3  Change in appetite - 0 0   Feeling bad or failure about yourself  - 0 0  Trouble concentrating - 0 0  Moving slowly or fidgety/restless - 0 0  Suicidal thoughts - 0 0  PHQ-9 Score - 0 3  Difficult doing work/chores - - -  Some recent data might be hidden    Allergies  Allergen Reactions  . Aspirin Other (See Comments)    HX Bleeding Ulcer   . Nsaids Other (See Comments)    Hx of bleeding ulcers  . Tolmetin Rash    Hx of bleeding ulcers    Prior to Admission medications   Medication Sig Start Date End Date Taking? Authorizing Provider  albuterol (PROAIR HFA) 108 (90 Base) MCG/ACT inhaler USE 2 PUFFS EVERY 6 HOURS AS NEEDED FOR SHORTNESS OF BREATH AND WHEEZING. 10/05/18  Yes Stallings, Zoe A, MD  amiodarone (PACERONE) 200 MG tablet Take 1 tablet (200 mg total) by mouth 2 (two) times daily. 10/20/18  Yes Seawell, Jaimie A, DO  apixaban (ELIQUIS) 5 MG TABS tablet Take 1 tablet (5 mg total) by mouth 2 (two) times daily. 10/20/18  Yes Seawell, Jaimie A, DO  busPIRone (BUSPAR) 5 MG tablet Take 1 tablet (5 mg total) by mouth 2 (two) times daily. 06/07/18  Yes [name], MD  citalopram (CELEXA) 20 MG tablet TAKE 1 TABLET EACH DAY. Patient taking differently: Take 20  mg by mouth daily.  06/28/18  Yes [name], MD  HYDROcodone-acetaminophen (NORCO/VICODIN) 5-325 MG tablet Take 1 tablet by mouth 5 (five) times daily.    Yes [provider]  hydrOXYzine (ATARAX/VISTARIL) 10 MG tablet TAKE 1 TABLET TWICE DAILY AS NEEDED FOR ANXIETY. 11/04/18  Yes [name], MD  levothyroxine (SYNTHROID) 100 MCG tablet TAKE 1 TABLET ONCE DAILY BEFORE BREAKFAST. 11/03/18  Yes [name], MD  metoprolol succinate (TOPROL-XL) 100 MG 24 hr tablet Take 1 tablet (100 mg total) by mouth daily. Take with or immediately following a meal. 10/21/18  Yes Seawell, Jaimie A, DO  mirtazapine (REMERON) 7.5 MG tablet TAKE ONE TABLET AT BEDTIME. Patient taking differently: Take 7.5 mg by mouth at bedtime.  08/24/18  Yes [name], MD  potassium chloride (MICRO-K) 10 MEQ CR capsule Take 1 capsule (10 mEq total) by mouth 2 (two) times daily. 11/26/18  Yes [name], MD  Spacer/Aero-Holding Chambers (AEROCHAMBER MV) inhaler Use as instructed 05/01/16  Yes Javier Glazier, MD  torsemide (DEMADEX) 20 MG tablet Take 2 tablets (40 mg total) by mouth daily. 10/20/18  Yes Seawell, Laurell Roof, DO    Past Medical History:  Diagnosis Date  . Abdominal pain   . Anxiety    maintained on Xanax tid for years.  . Arthritis    FEET, KNEES, HANDS  . Atrial fibrillation ([place]) 09/2018  . Chronic systolic CHF (congestive heart failure), NYHA class 2 ([place])    EF 25% 2011 MV,  50-55% echo 6/13  . Diabetes mellitus type 2, diet-controlled ([place])   . GERD (gastroesophageal reflux disease)   . Glaucoma    s/p surgery B in 30s.  [place].  . H/O hiatal hernia   . History of glaucoma   . History of small bowel obstruction 2009 AND NOV 2012  . HTN (hypertension)   . Hypothyroidism   . Left ovarian cyst   . Moderate mitral regurgitation   . Moderate tricuspid regurgitation   . Non-healing surgical wound    ABDOMINAL S/P EXPLORATOY LAPAROTOMY 04-18-2011  . Nonischemic cardiomyopathy ([place]) DR [facility]    EF is 25% per echo February 2012, non ischemic myoview 12/2009.  EF 50% echo 7/12 and plans for ICD cancelled.   . Peptic ulcer disease   . PVC (premature ventricular contraction)   . Shortness of breath   . Spinal stenosis   . Urge urinary incontinence   . Valvular heart disease    Moderate to severe MR, moderate TR, LAE per echo 7/11  . Wears dentures    upper denture-lower partial    Past Surgical History:  Procedure Laterality Date  . ABDOMINAL HERNIA REPAIR  32 YRS AGO   PERITINITIS  . APPENDECTOMY  1979   RUPTURED  . BENIGN RIGHT BREAST TUMOR REMOVED    . CARDIOVASCULAR STRESS TEST  JULY 2011-  DR HOCHREIN   NO ISCHEMIA  . CARDIOVERSION N/A 08/17/2018   Procedure: CARDIOVERSION;  Surgeon: Sueanne Margarita, MD;   Location: [facility] ENDOSCOPY;  Service: Cardiovascular;  Laterality: N/A;  . EXPLORATOMY LAP. / EXTENSIVE LYSIS ADHESIONS/ SMALL BOWEL RESECTION X2 WITH PRIMARY ANASTOMOSIS X2  04-18-2011   SMALL BOWEL OBSTRUCTION  . GLAUCOMA SURGERY  1978  . HERNIA REPAIR    . INCISION AND DRAINAGE OF WOUND N/A 09/13/2012   Procedure: INCISION  AND DEBRIDEMENT WOUND abdominal wall ;  Surgeon: Rolm Bookbinder, MD;  Location: [place];  Service: General;  Laterality: N/A;  coordination with Dr Migdalia Dk   . KNEE ARTHROSCOPY W/ MENISCECTOMY  06-05-2004  . TEE WITHOUT CARDIOVERSION N/A 08/17/2018   Procedure: TRANSESOPHAGEAL ECHOCARDIOGRAM (TEE);  Surgeon: Sueanne Margarita, MD;  Location[facility] ENDOSCOPY;  Service: Cardiovascular;  Laterality: N/A;  . THYROIDECTOMY  1978 GOITOR   AND CERVICAL COLD KNIFE CONE BX  . TONSILLECTOMY  AGE 43  . TRANSTHORACIC ECHOCARDIOGRAM  12-27-2010   EF 45-50%/ MODERATE MV REGURG. / LEFT ATRIUM MILDLY DILATED/ MILD TRICUSPID REGURG.  . TUBAL LIGATION    . VENTRAL HERNIA REPAIR  X4  LAST ONE 20 YRS AGO   ABDOMINAL --  EACH ONE IN DIFFERENT AREA  . VENTRAL HERNIA REPAIR  01/29/2012   Procedure: HERNIA REPAIR VENTRAL ADULT;  Surgeon: Theodoro Kos, DO;  Location: [place];  Service: Plastics;  Laterality: N/A;  . WOUND DEBRIDEMENT  09/25/2011   Procedure: DEBRIDEMENT CLOSURE/ABDOMINAL WOUND;  Surgeon: Theodoro Kos, DO;  Location: [place];  Service: Plastics;  Laterality: N/A;  excision of abdominal wound with primary closure    Social History   Tobacco Use  . Smoking status: Former Smoker    Packs/day: 1.50    Years: 45.00    Pack years: 67.50    Types: Cigarettes    Start date: 11/14/1964    Quit date: 09/22/2010    Years since quitting: 8.1  . Smokeless tobacco: Never Used  . Tobacco comment: quit 2011 or 2012 - Didn't smoke during pregnancies  Substance Use Topics  . Alcohol use: No    Family History  Problem Relation Age of Onset  . Stroke Brother    . Diabetes Brother   . Tuberculosis Father   . Stroke Mother   . Stroke Sister   . Heart disease Sister   . Emphysema Sister   . Diabetes Daughter     ROS Per hpi  OBJECTIVE:  Today's Vitals   12/01/18 1103  BP: 134/86  Pulse: (!) 115  Temp: 97.6 F (36.4 C)  TempSrc: Oral  SpO2: 95%  Weight: 195 lb (88.5 kg)   There is no height or weight on file to calculate BMI.  Wt Readings from Last 3 Encounters:  12/01/18 195 lb (88.5 kg)  11/01/18 195 lb (88.5 kg)  10/20/18 207 lb 4.8 oz (94 kg)    Physical Exam Vitals signs and nursing note reviewed.  Constitutional:      Appearance: She is well-developed.  HENT:     Head: Normocephalic and atraumatic.     Mouth/Throat:     Pharynx: No oropharyngeal exudate.  Eyes:     General: No scleral icterus.    Conjunctiva/sclera: Conjunctivae normal.     Pupils: Pupils are equal, round, and reactive to light.  Neck:     Musculoskeletal: Neck supple.  Cardiovascular:     Rate and Rhythm: Normal rate. Rhythm irregularly irregular.     Heart sounds: Normal heart sounds. No murmur. No friction rub. No gallop.      Comments: Slight petechial rash Pulmonary:     Effort: Pulmonary effort is normal.     Breath sounds: Normal breath sounds. No wheezing or rales.  Musculoskeletal:     Right lower leg: Pitting Edema (trace) present.     Left lower leg: No edema.  Skin:    General: Skin is warm and dry.  Neurological:     Mental Status: She is alert and oriented to person, place, and time.    My interpretation of EKG:  Afib, HR 107, PVC x1, poor R wave progression, LAFB, no acute changes  Lab Results  Component Value Date   WBC 7.2 11/01/2018   HGB 13.2 11/01/2018   HCT 41.5 11/01/2018   MCV 99 (H) 11/01/2018   PLT 199 11/01/2018    ASSESSMENT and PLAN  1. Chronic systolic CHF (congestive heart failure), NYHA class 2 (HCC) Euvolemic. Cont with LFM and medications. Cont with weight monitoring. FU with cards in Aug.  2.  Longstanding persistent atrial fibrillation Patient asymptomatic. Anticoagulated. Continue to monitor clinically. RTC precautions given. - EKG 12-Lead  3. Psychophysiological insomnia Controlled. Continue current regime.   4. Hypokalemia - Basic Metabolic Panel - Magnesium  5. Hypocalcemia - VITAMIN D 25 Hydroxy (Vit-D Deficiency, Fractures) - PTH, Intact and Calcium - Magnesium  Trial of pepcid at bedtime for morning nausea. - famotidine (PEPCID) 20 MG tablet; Take 1 tablet (20 mg total) by mouth at bedtime.  Return in about 3 months (around 03/03/2019).    [name], MD Primary Care at [facility], [place] [address] Ph.  [phone] Fax [phone]

## 2018-12-01 NOTE — Telephone Encounter (Signed)
Noted, addressed in clinic

## 2018-12-01 NOTE — Telephone Encounter (Signed)
Mo with [facility] stated they show that there is drug interaction with the amiodarone (PACERONE) 200 MG tablet, citalopram (CELEXA) 20 MG tablet, and hydrOXYzine (ATARAX/VISTARIL) 10 MG tablet. Mo stated pt heart rate was up in the 1 teens to 130 range.

## 2018-12-01 NOTE — Telephone Encounter (Signed)
New Message     [employer] is calling and would like FMLA paper work filled out so she can care for the pt and take her to her appts    Please call

## 2018-12-01 NOTE — Patient Instructions (Signed)
° ° ° °  If you have lab work done today you will be contacted with your lab results within the next 2 weeks.  If you have not heard from us then please contact us. The fastest way to get your results is to register for My Chart. ° ° °IF you received an x-ray today, you will receive an invoice from [facility]. Please contact [facility] at [phone] with questions or concerns regarding your invoice.  ° °IF you received labwork today, you will receive an invoice from [facility]. Please contact [facility] at [phone] with questions or concerns regarding your invoice.  ° °Our billing staff will not be able to assist you with questions regarding bills from these companies. ° °You will be contacted with the lab results as soon as they are available. The fastest way to get your results is to activate your My Chart account. Instructions are located on the last page of this paperwork. If you have not heard from us regarding the results in 2 weeks, please contact this office. °  ° ° ° °

## 2018-12-02 ENCOUNTER — Telehealth: Payer: Self-pay | Admitting: Family Medicine

## 2018-12-02 LAB — BASIC METABOLIC PANEL
BUN/Creatinine Ratio: 16 (ref 12–28)
BUN: 30 mg/dL — ABNORMAL HIGH (ref 8–27)
CO2: 23 mmol/L (ref 20–29)
Calcium: 9 mg/dL (ref 8.7–10.3)
Chloride: 100 mmol/L (ref 96–106)
Creatinine, Ser: 1.87 mg/dL — ABNORMAL HIGH (ref 0.57–1.00)
GFR calc Af Amer: 29 mL/min/{1.73_m2} — ABNORMAL LOW (ref >59)
GFR calc non Af Amer: 25 mL/min/{1.73_m2} — ABNORMAL LOW (ref >59)
Glucose: 132 mg/dL — ABNORMAL HIGH (ref 65–99)
Potassium: 3.9 mmol/L (ref 3.5–5.2)
Sodium: 139 mmol/L (ref 134–144)

## 2018-12-02 LAB — MAGNESIUM: Magnesium: 1.5 mg/dL — ABNORMAL LOW (ref 1.6–2.3)

## 2018-12-02 LAB — VITAMIN D 25 HYDROXY (VIT D DEFICIENCY, FRACTURES): Vit D, 25-Hydroxy: <4 ng/mL — ABNORMAL LOW (ref 30.0–100.0)

## 2018-12-02 LAB — PTH, INTACT AND CALCIUM: PTH: 225 pg/mL — ABNORMAL HIGH (ref 15–65)

## 2018-12-02 NOTE — Telephone Encounter (Signed)
Copied from [place] [id number]. Topic: Quick Communication - Home Health Verbal Orders >> Dec 02, 2018  2:36 PM Blase Mess A wrote: Caller/Agency:  [place] Number: [phone] [facility] to leave verbal on VM Requesting OT/PT/Skilled Nursing/Social Work/Speech Therapy: Request PT Requesting 1 time a week for 3 weeks,

## 2018-12-02 NOTE — Telephone Encounter (Signed)
Paperwork wil needl to come to me when I am in the office.

## 2018-12-03 ENCOUNTER — Telehealth: Payer: Self-pay | Admitting: Family Medicine

## 2018-12-03 NOTE — Telephone Encounter (Signed)
Patient's concern/request has been addressed.

## 2018-12-03 NOTE — Addendum Note (Signed)
Addended by: [name] on: 12/03/2018 07:42 PM   Modules accepted: Orders

## 2018-12-03 NOTE — Telephone Encounter (Signed)
I have attempted to refill this medication. Then the computer flagged that pt is also taking the celexa. Please advise is pt suppose to take both medications? I have called the pt to try to confirm with her and she stated that she is taking what is prescribed. They are both on her list and pt stated sure she is taking both. Pt was no fully sure and says she would have to talk to the pharmasist to be sure.   Please advise if pt is supposed to be on both medications [place]?

## 2018-12-03 NOTE — Telephone Encounter (Signed)
Please call [place] and approve verbal orders thanks

## 2018-12-03 NOTE — Telephone Encounter (Signed)
Verbal orders   Copied from [place] [id number]. Topic: Quick Communication - Home Health Verbal Orders >> Dec 02, 2018  2:36 PM Blase Mess A wrote: Caller/Agency:  [place] Number: [phone] [facility] to leave verbal on VM Requesting OT/PT/Skilled Nursing/Social Work/Speech Therapy: Request PT Requesting 1 time a week for 3 weeks,

## 2018-12-06 NOTE — Telephone Encounter (Signed)
Called and gave verbal orders for PT, advised to call office back if they have anymore concerns.

## 2018-12-07 ENCOUNTER — Ambulatory Visit: Payer: Medicare Other | Admitting: Family Medicine

## 2018-12-07 NOTE — Telephone Encounter (Signed)
Mo called back and is aware these issues addressed in clinic.

## 2018-12-08 NOTE — Telephone Encounter (Signed)
Advised daughter to bring papers by for Dr Percival Spanish to fill out

## 2018-12-14 ENCOUNTER — Ambulatory Visit: Payer: Medicare Other | Admitting: Family Medicine

## 2018-12-15 ENCOUNTER — Telehealth: Payer: Self-pay | Admitting: Family Medicine

## 2018-12-15 NOTE — Telephone Encounter (Signed)
Teresa [name] w/[facility] [phone] wanted the provider to know that the patient's vitals were stable, but she is complaining of nausea.

## 2018-12-23 ENCOUNTER — Telehealth: Payer: Self-pay

## 2018-12-23 NOTE — Telephone Encounter (Signed)
12/23/2018 Patient walked in office to bring FMLA Forms from [facility] [employer] [employer] [facility] [employer] and 25.00 check. I inter-office to [employer] to process. cbr

## 2018-12-28 ENCOUNTER — Emergency Department (HOSPITAL_COMMUNITY): Payer: Medicare Other

## 2018-12-28 ENCOUNTER — Inpatient Hospital Stay (HOSPITAL_COMMUNITY)
Admission: EM | Admit: 2018-12-28 | Discharge: 2019-01-13 | DRG: 682 | Disposition: A | Discharge status: Skilled Nursing Facility | Payer: Medicare Other | Attending: Family Medicine | Admitting: Family Medicine

## 2018-12-28 ENCOUNTER — Ambulatory Visit: Payer: Medicare Other | Admitting: Family Medicine

## 2018-12-28 ENCOUNTER — Other Ambulatory Visit: Payer: Self-pay

## 2018-12-28 DIAGNOSIS — K7200 Acute and subacute hepatic failure without coma: Secondary | ICD-10-CM | POA: Diagnosis present

## 2018-12-28 DIAGNOSIS — R740 Nonspecific elevation of levels of transaminase and lactic acid dehydrogenase [LDH]: Secondary | ICD-10-CM

## 2018-12-28 DIAGNOSIS — E1165 Type 2 diabetes mellitus with hyperglycemia: Secondary | ICD-10-CM | POA: Diagnosis not present

## 2018-12-28 DIAGNOSIS — E876 Hypokalemia: Secondary | ICD-10-CM | POA: Diagnosis not present

## 2018-12-28 DIAGNOSIS — I428 Other cardiomyopathies: Secondary | ICD-10-CM

## 2018-12-28 DIAGNOSIS — I4821 Permanent atrial fibrillation: Secondary | ICD-10-CM | POA: Diagnosis present

## 2018-12-28 DIAGNOSIS — Z888 Allergy status to other drugs, medicaments and biological substances status: Secondary | ICD-10-CM

## 2018-12-28 DIAGNOSIS — I4510 Unspecified right bundle-branch block: Secondary | ICD-10-CM | POA: Diagnosis present

## 2018-12-28 DIAGNOSIS — I9589 Other hypotension: Secondary | ICD-10-CM | POA: Diagnosis present

## 2018-12-28 DIAGNOSIS — R197 Diarrhea, unspecified: Secondary | ICD-10-CM | POA: Diagnosis not present

## 2018-12-28 DIAGNOSIS — I509 Heart failure, unspecified: Secondary | ICD-10-CM

## 2018-12-28 DIAGNOSIS — E872 Acidosis: Secondary | ICD-10-CM | POA: Diagnosis present

## 2018-12-28 DIAGNOSIS — Z79890 Hormone replacement therapy (postmenopausal): Secondary | ICD-10-CM

## 2018-12-28 DIAGNOSIS — K219 Gastro-esophageal reflux disease without esophagitis: Secondary | ICD-10-CM | POA: Diagnosis present

## 2018-12-28 DIAGNOSIS — N170 Acute kidney failure with tubular necrosis: Secondary | ICD-10-CM | POA: Diagnosis not present

## 2018-12-28 DIAGNOSIS — N184 Chronic kidney disease, stage 4 (severe): Secondary | ICD-10-CM | POA: Diagnosis present

## 2018-12-28 DIAGNOSIS — Z87891 Personal history of nicotine dependence: Secondary | ICD-10-CM

## 2018-12-28 DIAGNOSIS — I130 Hypertensive heart and chronic kidney disease with heart failure and stage 1 through stage 4 chronic kidney disease, or unspecified chronic kidney disease: Secondary | ICD-10-CM | POA: Diagnosis present

## 2018-12-28 DIAGNOSIS — I5023 Acute on chronic systolic (congestive) heart failure: Secondary | ICD-10-CM | POA: Diagnosis not present

## 2018-12-28 DIAGNOSIS — I5022 Chronic systolic (congestive) heart failure: Secondary | ICD-10-CM | POA: Diagnosis not present

## 2018-12-28 DIAGNOSIS — N189 Chronic kidney disease, unspecified: Secondary | ICD-10-CM | POA: Diagnosis present

## 2018-12-28 DIAGNOSIS — I959 Hypotension, unspecified: Secondary | ICD-10-CM | POA: Diagnosis present

## 2018-12-28 DIAGNOSIS — J810 Acute pulmonary edema: Secondary | ICD-10-CM | POA: Diagnosis not present

## 2018-12-28 DIAGNOSIS — E860 Dehydration: Secondary | ICD-10-CM | POA: Diagnosis present

## 2018-12-28 DIAGNOSIS — A419 Sepsis, unspecified organism: Secondary | ICD-10-CM | POA: Insufficient documentation

## 2018-12-28 DIAGNOSIS — N179 Acute kidney failure, unspecified: Principal | ICD-10-CM | POA: Diagnosis present

## 2018-12-28 DIAGNOSIS — R627 Adult failure to thrive: Secondary | ICD-10-CM | POA: Diagnosis present

## 2018-12-28 DIAGNOSIS — Z8249 Family history of ischemic heart disease and other diseases of the circulatory system: Secondary | ICD-10-CM

## 2018-12-28 DIAGNOSIS — I4819 Other persistent atrial fibrillation: Secondary | ICD-10-CM | POA: Diagnosis not present

## 2018-12-28 DIAGNOSIS — Z9851 Tubal ligation status: Secondary | ICD-10-CM

## 2018-12-28 DIAGNOSIS — R9431 Abnormal electrocardiogram [ECG] [EKG]: Secondary | ICD-10-CM | POA: Diagnosis not present

## 2018-12-28 DIAGNOSIS — I4891 Unspecified atrial fibrillation: Secondary | ICD-10-CM | POA: Diagnosis not present

## 2018-12-28 DIAGNOSIS — I493 Ventricular premature depolarization: Secondary | ICD-10-CM | POA: Diagnosis not present

## 2018-12-28 DIAGNOSIS — E669 Obesity, unspecified: Secondary | ICD-10-CM | POA: Diagnosis present

## 2018-12-28 DIAGNOSIS — I5043 Acute on chronic combined systolic (congestive) and diastolic (congestive) heart failure: Secondary | ICD-10-CM | POA: Diagnosis present

## 2018-12-28 DIAGNOSIS — F329 Major depressive disorder, single episode, unspecified: Secondary | ICD-10-CM | POA: Diagnosis not present

## 2018-12-28 DIAGNOSIS — E119 Type 2 diabetes mellitus without complications: Secondary | ICD-10-CM | POA: Diagnosis not present

## 2018-12-28 DIAGNOSIS — F05 Delirium due to known physiological condition: Secondary | ICD-10-CM | POA: Diagnosis not present

## 2018-12-28 DIAGNOSIS — R5383 Other fatigue: Secondary | ICD-10-CM | POA: Diagnosis present

## 2018-12-28 DIAGNOSIS — F3289 Other specified depressive episodes: Secondary | ICD-10-CM | POA: Diagnosis not present

## 2018-12-28 DIAGNOSIS — I10 Essential (primary) hypertension: Secondary | ICD-10-CM | POA: Diagnosis present

## 2018-12-28 DIAGNOSIS — M109 Gout, unspecified: Secondary | ICD-10-CM | POA: Diagnosis not present

## 2018-12-28 DIAGNOSIS — Z515 Encounter for palliative care: Secondary | ICD-10-CM | POA: Diagnosis not present

## 2018-12-28 DIAGNOSIS — G9340 Encephalopathy, unspecified: Secondary | ICD-10-CM | POA: Diagnosis not present

## 2018-12-28 DIAGNOSIS — F411 Generalized anxiety disorder: Secondary | ICD-10-CM | POA: Diagnosis present

## 2018-12-28 DIAGNOSIS — H9191 Unspecified hearing loss, right ear: Secondary | ICD-10-CM | POA: Diagnosis present

## 2018-12-28 DIAGNOSIS — E034 Atrophy of thyroid (acquired): Secondary | ICD-10-CM | POA: Diagnosis not present

## 2018-12-28 DIAGNOSIS — Z6836 Body mass index (BMI) 36.0-36.9, adult: Secondary | ICD-10-CM

## 2018-12-28 DIAGNOSIS — I4811 Longstanding persistent atrial fibrillation: Secondary | ICD-10-CM | POA: Diagnosis not present

## 2018-12-28 DIAGNOSIS — K7290 Hepatic failure, unspecified without coma: Secondary | ICD-10-CM | POA: Diagnosis present

## 2018-12-28 DIAGNOSIS — E861 Hypovolemia: Secondary | ICD-10-CM | POA: Diagnosis present

## 2018-12-28 DIAGNOSIS — E11649 Type 2 diabetes mellitus with hypoglycemia without coma: Secondary | ICD-10-CM | POA: Diagnosis present

## 2018-12-28 DIAGNOSIS — R0902 Hypoxemia: Secondary | ICD-10-CM

## 2018-12-28 DIAGNOSIS — J9601 Acute respiratory failure with hypoxia: Secondary | ICD-10-CM | POA: Diagnosis present

## 2018-12-28 DIAGNOSIS — I472 Ventricular tachycardia: Secondary | ICD-10-CM | POA: Diagnosis not present

## 2018-12-28 DIAGNOSIS — G9341 Metabolic encephalopathy: Secondary | ICD-10-CM | POA: Diagnosis present

## 2018-12-28 DIAGNOSIS — Z8719 Personal history of other diseases of the digestive system: Secondary | ICD-10-CM

## 2018-12-28 DIAGNOSIS — Z886 Allergy status to analgesic agent status: Secondary | ICD-10-CM

## 2018-12-28 DIAGNOSIS — H409 Unspecified glaucoma: Secondary | ICD-10-CM | POA: Diagnosis present

## 2018-12-28 DIAGNOSIS — F4323 Adjustment disorder with mixed anxiety and depressed mood: Secondary | ICD-10-CM | POA: Diagnosis present

## 2018-12-28 DIAGNOSIS — E1122 Type 2 diabetes mellitus with diabetic chronic kidney disease: Secondary | ICD-10-CM | POA: Diagnosis present

## 2018-12-28 DIAGNOSIS — Z972 Presence of dental prosthetic device (complete) (partial): Secondary | ICD-10-CM

## 2018-12-28 DIAGNOSIS — I480 Paroxysmal atrial fibrillation: Secondary | ICD-10-CM | POA: Diagnosis present

## 2018-12-28 DIAGNOSIS — M4800 Spinal stenosis, site unspecified: Secondary | ICD-10-CM | POA: Diagnosis present

## 2018-12-28 DIAGNOSIS — I2510 Atherosclerotic heart disease of native coronary artery without angina pectoris: Secondary | ICD-10-CM | POA: Diagnosis present

## 2018-12-28 DIAGNOSIS — Z79899 Other long term (current) drug therapy: Secondary | ICD-10-CM

## 2018-12-28 DIAGNOSIS — E890 Postprocedural hypothyroidism: Secondary | ICD-10-CM | POA: Diagnosis present

## 2018-12-28 DIAGNOSIS — I5084 End stage heart failure: Secondary | ICD-10-CM | POA: Diagnosis present

## 2018-12-28 DIAGNOSIS — E875 Hyperkalemia: Secondary | ICD-10-CM | POA: Diagnosis present

## 2018-12-28 DIAGNOSIS — I361 Nonrheumatic tricuspid (valve) insufficiency: Secondary | ICD-10-CM | POA: Diagnosis not present

## 2018-12-28 DIAGNOSIS — R0602 Shortness of breath: Secondary | ICD-10-CM

## 2018-12-28 DIAGNOSIS — R5381 Other malaise: Secondary | ICD-10-CM | POA: Diagnosis not present

## 2018-12-28 DIAGNOSIS — I340 Nonrheumatic mitral (valve) insufficiency: Secondary | ICD-10-CM | POA: Diagnosis not present

## 2018-12-28 DIAGNOSIS — Z7189 Other specified counseling: Secondary | ICD-10-CM

## 2018-12-28 DIAGNOSIS — N183 Chronic kidney disease, stage 3 (moderate): Secondary | ICD-10-CM | POA: Diagnosis not present

## 2018-12-28 DIAGNOSIS — I083 Combined rheumatic disorders of mitral, aortic and tricuspid valves: Secondary | ICD-10-CM | POA: Diagnosis present

## 2018-12-28 DIAGNOSIS — Z20828 Contact with and (suspected) exposure to other viral communicable diseases: Secondary | ICD-10-CM | POA: Diagnosis present

## 2018-12-28 DIAGNOSIS — M19072 Primary osteoarthritis, left ankle and foot: Secondary | ICD-10-CM | POA: Diagnosis present

## 2018-12-28 DIAGNOSIS — K760 Fatty (change of) liver, not elsewhere classified: Secondary | ICD-10-CM | POA: Diagnosis present

## 2018-12-28 DIAGNOSIS — R680 Hypothermia, not associated with low environmental temperature: Secondary | ICD-10-CM | POA: Diagnosis present

## 2018-12-28 DIAGNOSIS — E039 Hypothyroidism, unspecified: Secondary | ICD-10-CM | POA: Diagnosis present

## 2018-12-28 DIAGNOSIS — Z7901 Long term (current) use of anticoagulants: Secondary | ICD-10-CM

## 2018-12-28 DIAGNOSIS — M19042 Primary osteoarthritis, left hand: Secondary | ICD-10-CM | POA: Diagnosis present

## 2018-12-28 DIAGNOSIS — F419 Anxiety disorder, unspecified: Secondary | ICD-10-CM | POA: Diagnosis not present

## 2018-12-28 DIAGNOSIS — Z833 Family history of diabetes mellitus: Secondary | ICD-10-CM

## 2018-12-28 DIAGNOSIS — M19041 Primary osteoarthritis, right hand: Secondary | ICD-10-CM | POA: Diagnosis present

## 2018-12-28 DIAGNOSIS — I071 Rheumatic tricuspid insufficiency: Secondary | ICD-10-CM | POA: Diagnosis not present

## 2018-12-28 DIAGNOSIS — M19071 Primary osteoarthritis, right ankle and foot: Secondary | ICD-10-CM | POA: Diagnosis present

## 2018-12-28 DIAGNOSIS — R748 Abnormal levels of other serum enzymes: Secondary | ICD-10-CM | POA: Diagnosis not present

## 2018-12-28 DIAGNOSIS — M170 Bilateral primary osteoarthritis of knee: Secondary | ICD-10-CM | POA: Diagnosis present

## 2018-12-28 DIAGNOSIS — Z8711 Personal history of peptic ulcer disease: Secondary | ICD-10-CM

## 2018-12-28 LAB — CBC WITH DIFFERENTIAL/PLATELET
Abs Immature Granulocytes: 0.08 10*3/uL — ABNORMAL HIGH (ref 0.00–0.07)
Basophils Absolute: 0 10*3/uL (ref 0.0–0.1)
Basophils Relative: 0 %
Eosinophils Absolute: 0 10*3/uL (ref 0.0–0.5)
Eosinophils Relative: 0 %
HCT: 46.5 % — ABNORMAL HIGH (ref 36.0–46.0)
Hemoglobin: 14.6 g/dL (ref 12.0–15.0)
Immature Granulocytes: 1 %
Lymphocytes Relative: 16 %
Lymphs Abs: 1.7 10*3/uL (ref 0.7–4.0)
MCH: 30.8 pg (ref 26.0–34.0)
MCHC: 31.4 g/dL (ref 30.0–36.0)
MCV: 98.1 fL (ref 80.0–100.0)
Monocytes Absolute: 1.2 10*3/uL — ABNORMAL HIGH (ref 0.1–1.0)
Monocytes Relative: 11 %
Neutro Abs: 7.6 10*3/uL (ref 1.7–7.7)
Neutrophils Relative %: 72 %
Platelets: 179 10*3/uL (ref 150–400)
RBC: 4.74 MIL/uL (ref 3.87–5.11)
RDW: 15.3 % (ref 11.5–15.5)
WBC: 10.6 10*3/uL — ABNORMAL HIGH (ref 4.0–10.5)
nRBC: 0.2 % (ref 0.0–0.2)

## 2018-12-28 LAB — CBC
HCT: 51.9 % — ABNORMAL HIGH (ref 36.0–46.0)
Hemoglobin: 16.1 g/dL — ABNORMAL HIGH (ref 12.0–15.0)
MCH: 30.2 pg (ref 26.0–34.0)
MCHC: 31 g/dL (ref 30.0–36.0)
MCV: 97.4 fL (ref 80.0–100.0)
Platelets: 213 10*3/uL (ref 150–400)
RBC: 5.33 MIL/uL — ABNORMAL HIGH (ref 3.87–5.11)
RDW: 15.5 % (ref 11.5–15.5)
WBC: 9.3 10*3/uL (ref 4.0–10.5)
nRBC: 0 % (ref 0.0–0.2)

## 2018-12-28 LAB — COMPREHENSIVE METABOLIC PANEL
ALT: 287 U/L — ABNORMAL HIGH (ref 0–44)
AST: 405 U/L — ABNORMAL HIGH (ref 15–41)
Albumin: 3.9 g/dL (ref 3.5–5.0)
Alkaline Phosphatase: 269 U/L — ABNORMAL HIGH (ref 38–126)
Anion gap: 23 — ABNORMAL HIGH (ref 5–15)
BUN: 62 mg/dL — ABNORMAL HIGH (ref 8–23)
CO2: 16 mmol/L — ABNORMAL LOW (ref 22–32)
Calcium: 9.4 mg/dL (ref 8.9–10.3)
Chloride: 97 mmol/L — ABNORMAL LOW (ref 98–111)
Creatinine, Ser: 4.82 mg/dL — ABNORMAL HIGH (ref 0.44–1.00)
GFR calc Af Amer: 9 mL/min — ABNORMAL LOW (ref >60)
GFR calc non Af Amer: 8 mL/min — ABNORMAL LOW (ref >60)
Glucose, Bld: 37 mg/dL — CL (ref 70–99)
Potassium: 5.8 mmol/L — ABNORMAL HIGH (ref 3.5–5.1)
Sodium: 136 mmol/L (ref 135–145)
Total Bilirubin: 2.9 mg/dL — ABNORMAL HIGH (ref 0.3–1.2)
Total Protein: 6.6 g/dL (ref 6.5–8.1)

## 2018-12-28 LAB — URINALYSIS, ROUTINE W REFLEX MICROSCOPIC
Bilirubin Urine: NEGATIVE
Glucose, UA: NEGATIVE mg/dL
Ketones, ur: NEGATIVE mg/dL
Leukocytes,Ua: NEGATIVE
Nitrite: NEGATIVE
Protein, ur: 100 mg/dL — AB
Specific Gravity, Urine: 1.013 (ref 1.005–1.030)
pH: 5 (ref 5.0–8.0)

## 2018-12-28 LAB — TROPONIN I (HIGH SENSITIVITY)
Troponin I (High Sensitivity): 49 ng/L — ABNORMAL HIGH (ref <18)
Troponin I (High Sensitivity): 31 ng/L — ABNORMAL HIGH (ref <18)

## 2018-12-28 LAB — CBG MONITORING, ED
Glucose-Capillary: 37 mg/dL — CL (ref 70–99)
Glucose-Capillary: 150 mg/dL — ABNORMAL HIGH (ref 70–99)
Glucose-Capillary: 120 mg/dL — ABNORMAL HIGH (ref 70–99)
Glucose-Capillary: 88 mg/dL (ref 70–99)

## 2018-12-28 LAB — LACTIC ACID, PLASMA
Lactic Acid, Venous: 6.9 mmol/L (ref 0.5–1.9)
Lactic Acid, Venous: 4.9 mmol/L (ref 0.5–1.9)

## 2018-12-28 LAB — SARS CORONAVIRUS 2 BY RT PCR (HOSPITAL ORDER, PERFORMED IN ~~LOC~~ HOSPITAL LAB): SARS Coronavirus 2: NEGATIVE

## 2018-12-28 LAB — C-REACTIVE PROTEIN: CRP: 2.3 mg/dL — ABNORMAL HIGH (ref <1.0)

## 2018-12-28 LAB — FERRITIN: Ferritin: 77 ng/mL (ref 11–307)

## 2018-12-28 LAB — SEDIMENTATION RATE: Sed Rate: 1 mm/hr (ref 0–22)

## 2018-12-28 LAB — APTT: aPTT: 50 seconds — ABNORMAL HIGH (ref 24–36)

## 2018-12-28 LAB — MRSA PCR SCREENING: MRSA by PCR: NEGATIVE

## 2018-12-28 LAB — GLUCOSE, CAPILLARY
Glucose-Capillary: 91 mg/dL (ref 70–99)
Glucose-Capillary: 65 mg/dL — ABNORMAL LOW (ref 70–99)
Glucose-Capillary: 78 mg/dL (ref 70–99)

## 2018-12-28 LAB — LACTATE DEHYDROGENASE: LDH: 957 U/L — ABNORMAL HIGH (ref 98–192)

## 2018-12-28 LAB — PROCALCITONIN: Procalcitonin: 0.57 ng/mL

## 2018-12-28 MED ORDER — SODIUM CHLORIDE 0.9 % IV BOLUS (SEPSIS)
1000.0000 mL | Freq: Once | INTRAVENOUS | Status: AC
  Administered 2018-12-28: 1000 mL via INTRAVENOUS

## 2018-12-28 MED ORDER — AMIODARONE HCL 200 MG PO TABS
200.0000 mg | ORAL_TABLET | Freq: Two times a day (BID) | ORAL | Status: DC
  Administered 2018-12-28 – 2018-12-29 (×2): 200 mg via ORAL
  Filled 2018-12-28 (×2): qty 1

## 2018-12-28 MED ORDER — FAMOTIDINE 20 MG PO TABS
20.0000 mg | ORAL_TABLET | Freq: Every day | ORAL | Status: DC
  Administered 2018-12-28 – 2019-01-02 (×4): 20 mg via ORAL
  Filled 2018-12-28 (×4): qty 1

## 2018-12-28 MED ORDER — METRONIDAZOLE IN NACL 5-0.79 MG/ML-% IV SOLN
500.0000 mg | Freq: Once | INTRAVENOUS | Status: AC
  Administered 2018-12-28: 500 mg via INTRAVENOUS
  Filled 2018-12-28: qty 100

## 2018-12-28 MED ORDER — LEVOTHYROXINE SODIUM 100 MCG PO TABS
100.0000 ug | ORAL_TABLET | Freq: Every day | ORAL | Status: DC
  Administered 2018-12-29 – 2019-01-13 (×14): 100 ug via ORAL
  Filled 2018-12-28 (×16): qty 1

## 2018-12-28 MED ORDER — BUSPIRONE HCL 10 MG PO TABS
5.0000 mg | ORAL_TABLET | Freq: Two times a day (BID) | ORAL | Status: DC
  Administered 2018-12-28 – 2019-01-08 (×19): 5 mg via ORAL
  Filled 2018-12-28 (×19): qty 1

## 2018-12-28 MED ORDER — DEXTROSE 50 % IV SOLN
1.0000 | Freq: Once | INTRAVENOUS | Status: AC
  Administered 2018-12-28: 10:00:00 via INTRAVENOUS

## 2018-12-28 MED ORDER — POLYETHYLENE GLYCOL 3350 17 G PO PACK: 17.0000 g | PACK | Freq: Every day | ORAL | Status: DC | PRN

## 2018-12-28 MED ORDER — SODIUM CHLORIDE 0.9 % IV BOLUS
1000.0000 mL | Freq: Once | INTRAVENOUS | Status: AC
  Administered 2018-12-28: 1000 mL via INTRAVENOUS

## 2018-12-28 MED ORDER — CITALOPRAM HYDROBROMIDE 20 MG PO TABS
20.0000 mg | ORAL_TABLET | Freq: Every day | ORAL | Status: DC
  Administered 2018-12-28 – 2019-01-13 (×16): 20 mg via ORAL
  Filled 2018-12-28 (×16): qty 1

## 2018-12-28 MED ORDER — VANCOMYCIN HCL IN DEXTROSE 1-5 GM/200ML-% IV SOLN
1000.0000 mg | Freq: Once | INTRAVENOUS | Status: AC
  Administered 2018-12-28: 1000 mg via INTRAVENOUS
  Filled 2018-12-28: qty 200

## 2018-12-28 MED ORDER — SODIUM CHLORIDE 0.9 % IV SOLN
1.0000 g | INTRAVENOUS | Status: DC
  Filled 2018-12-28: qty 1

## 2018-12-28 MED ORDER — MIRTAZAPINE 15 MG PO TABS
7.5000 mg | ORAL_TABLET | Freq: Every day | ORAL | Status: DC
  Administered 2018-12-28 – 2019-01-07 (×9): 7.5 mg via ORAL
  Filled 2018-12-28 (×9): qty 1

## 2018-12-28 MED ORDER — FUROSEMIDE 10 MG/ML IJ SOLN
40.0000 mg | Freq: Once | INTRAMUSCULAR | Status: AC
  Administered 2018-12-28: 40 mg via INTRAVENOUS
  Filled 2018-12-28: qty 4

## 2018-12-28 MED ORDER — ACETAMINOPHEN 325 MG PO TABS
650.0000 mg | ORAL_TABLET | Freq: Four times a day (QID) | ORAL | Status: DC | PRN
  Administered 2018-12-28: 650 mg via ORAL
  Filled 2018-12-28: qty 2

## 2018-12-28 MED ORDER — MAGNESIUM SULFATE 2 GM/50ML IV SOLN
2.0000 g | Freq: Once | INTRAVENOUS | Status: AC
  Administered 2018-12-28: 2 g via INTRAVENOUS
  Filled 2018-12-28: qty 50

## 2018-12-28 MED ORDER — SODIUM CHLORIDE 0.9 % IV SOLN
INTRAVENOUS | Status: DC
  Administered 2018-12-28 – 2018-12-29 (×2): via INTRAVENOUS

## 2018-12-28 MED ORDER — INSULIN ASPART 100 UNIT/ML ~~LOC~~ SOLN
0.0000 [IU] | SUBCUTANEOUS | Status: DC
  Administered 2018-12-30 – 2018-12-31 (×3): 1 [IU] via SUBCUTANEOUS
  Administered 2019-01-01: 2 [IU] via SUBCUTANEOUS
  Administered 2019-01-01: 1 [IU] via SUBCUTANEOUS
  Administered 2019-01-01: 2 [IU] via SUBCUTANEOUS
  Administered 2019-01-02 (×3): 1 [IU] via SUBCUTANEOUS
  Administered 2019-01-02: 2 [IU] via SUBCUTANEOUS
  Administered 2019-01-02: 5 [IU] via SUBCUTANEOUS
  Administered 2019-01-02: 3 [IU] via SUBCUTANEOUS
  Administered 2019-01-02: 2 [IU] via SUBCUTANEOUS
  Administered 2019-01-03: 1 [IU] via SUBCUTANEOUS
  Administered 2019-01-03 (×3): 2 [IU] via SUBCUTANEOUS

## 2018-12-28 MED ORDER — ACETAMINOPHEN 650 MG RE SUPP: 650.0000 mg | Freq: Four times a day (QID) | RECTAL | Status: DC | PRN

## 2018-12-28 MED ORDER — APIXABAN 5 MG PO TABS
5.0000 mg | ORAL_TABLET | Freq: Two times a day (BID) | ORAL | Status: DC
  Administered 2018-12-28 – 2018-12-29 (×2): 5 mg via ORAL
  Filled 2018-12-28 (×2): qty 1

## 2018-12-28 MED ORDER — METOPROLOL SUCCINATE ER 100 MG PO TB24
100.0000 mg | ORAL_TABLET | Freq: Every day | ORAL | Status: DC
  Administered 2018-12-28: 100 mg via ORAL
  Filled 2018-12-28 (×2): qty 1

## 2018-12-28 MED ORDER — SODIUM CHLORIDE 0.9 % IV BOLUS
500.0000 mL | Freq: Once | INTRAVENOUS | Status: AC
  Administered 2018-12-28: 500 mL via INTRAVENOUS

## 2018-12-28 MED ORDER — DEXTROSE 50 % IV SOLN
INTRAVENOUS | Status: AC
  Administered 2018-12-28: 10:00:00 via INTRAVENOUS
  Filled 2018-12-28: qty 50

## 2018-12-28 MED ORDER — DEXTROSE 50 % IV SOLN
INTRAVENOUS | Status: AC
  Filled 2018-12-28: qty 50

## 2018-12-28 MED ORDER — SODIUM CHLORIDE 0.9 % IV SOLN
2.0000 g | Freq: Once | INTRAVENOUS | Status: AC
  Administered 2018-12-28: 2 g via INTRAVENOUS
  Filled 2018-12-28: qty 2

## 2018-12-28 MED ORDER — SODIUM CHLORIDE 0.9 % IV BOLUS (SEPSIS)
1000.0000 mL | Freq: Once | INTRAVENOUS | Status: AC
  Administered 2018-12-28: 13:00:00 1000 mL via INTRAVENOUS

## 2018-12-28 NOTE — Progress Notes (Signed)
Pharmacy Antibiotic Note  Teresa [name] is a 78 y.o. female admitted on 12/28/2018 with sepsis.  covid neg  AKI - scr 4.82, baseline looks to be around 1.8  Plan: Cefepime 2 g x 1 then 1 g q24h Vanc 1 g x 1 F/u renal fx for further vanc dose Monitor renal fx cx vanc lvls prn  Weight: 195 lb (88.5 kg)  Temp (24hrs), Avg:96.4 F (35.8 C), Min:96.4 F (35.8 C), Max:96.4 F (35.8 C)  Recent Labs  Lab 12/28/18 1000 12/28/18 1002  WBC 9.3  --   CREATININE 4.82*  --   LATICACIDVEN  --  6.9*    Estimated Creatinine Clearance: 10.1 mL/min (A) (by C-G formula based on SCr of 4.82 mg/dL (H)).    Allergies  Allergen Reactions  . Aspirin Other (See Comments)    HX Bleeding Ulcer   . Nsaids Other (See Comments)    Hx of bleeding ulcers  . Tolmetin Rash    Hx of bleeding ulcers   Levester Fresh, PharmD, BCPS, BCCCP Clinical Pharmacist [phone]  Please check AMION for all [place] numbers  12/28/2018 12:44 PM

## 2018-12-28 NOTE — Telephone Encounter (Signed)
12/28/2018 Received FMLA Form from [employer] back from [place], I gave to Dr. Percival Spanish nurse to sign off on. cbr

## 2018-12-28 NOTE — ED Triage Notes (Signed)
Pt here from home for generalized weakness and edema x 3 days, no bowel movement x 3 days. Usually able to ambulate, cannot stand now. CBG 41, glucagon given. Occasional runs of vtach. Otherwise afib 80-140. Pt c/o shob. 92% room air.

## 2018-12-28 NOTE — H&P (Signed)
History and Physical:    ISLAND DOHMEN   CBS:[id number] DOB: 28-Dec-1940 DOA: 12/28/2018  Referring MD/provider: Dr Jeanell Sparrow PCP: [name], MD   Patient coming from: Home  Chief Complaint: Decreased p.o. intake x2 weeks with progressive fatigue and increasing difficulty walking.  History is primarily per patient's son.  History of Present Illness:   Teresa [name] is an 78 y.o. female with past medical history significant for hypertension, diabetes, CAD, HFrEF, atrial fibrillation, depression who is brought in because she was so weak she was unable to walk earlier today.  Patient son states that patient was in her usual state of not very good health until July 4th weekend when patient's daughter died from congestive heart failure.  Patient's oldest son had apparently died from similar issues 4 years ago.  Apparently since then patient has had very little to eat or drink.  Patient's youngest son who visits her 3 times a day states that she has been having a few bites to eat every day.  She had apparently initially been keeping up her fluids but over the last 3 to 4 days has been so tired that she has not been drinking much fluids either.    Apparently at baseline patient walks with a walker and has a bedside commode which her son empties.  He notes that she has not had any diarrhea or vomiting.  She has not had any fevers or chills.  She is just had a slow progressive decline over the past 2 weeks.  Over the past 2 to 3 days her decline has been more rapid and he has noticed that her urine has been quite dark and much less than usual.  This morning patient had difficulty getting out of bed due to fatigue and weakness so he forced her to come to the ED.  Patient apparently had been taking all her medications every day as her son had been bringing them to her.  Patient did take all her cardiac/antihypertensive and diabetic medications today.  Patient herself admits to feeling depressed.  She  denies however that she was trying to hurt herself at all.  She has no suicidal intention.  ED Course:  The patient was noted to be extremely hypotensive, minimally responsive, hypoglycemic with glucose of 37, and in acute renal failure with a creatinine of almost 4.7 and elevated lactate at 6.7.  Her initial EKG showed a wide complex irregular rhythm.  Patient was treated with sepsis protocol and aggressive IV fluid resuscitation and cefepime/vancomycin which resulted in some modest improvement of her blood pressure, decreasing of her lactate to 4.7 and improvement of her mental status.  EKG also reverted back to her usual atrial fibrillation.  Infectious work-up was negative with no leukocytosis or left shift, a negative chest x-ray, negative urine and COVID test being negative.  No evidence for intra-abdominal infection.    ROS:   ROS   Review of Systems: Patient not able to provide me  with coherent ROS  Past Medical History:   Past Medical History:  Diagnosis Date  . Abdominal pain   . Anxiety    maintained on Xanax tid for years.  . Arthritis    FEET, KNEES, HANDS  . Atrial fibrillation ([place]) 09/2018  . Chronic systolic CHF (congestive heart failure), NYHA class 2 ([place])    EF 25% 2011 MV,  50-55% echo 6/13  . Diabetes mellitus type 2, diet-controlled ([place])   . GERD (gastroesophageal reflux disease)   .  Glaucoma    s/p surgery B in 30s.  [place].  . H/O hiatal hernia   . History of glaucoma   . History of small bowel obstruction 2009 AND NOV 2012  . HTN (hypertension)   . Hypothyroidism   . Left ovarian cyst   . Moderate mitral regurgitation   . Moderate tricuspid regurgitation   . Non-healing surgical wound    ABDOMINAL S/P EXPLORATOY LAPAROTOMY 04-18-2011  . Nonischemic cardiomyopathy ([place]) [facility]    EF is 25% per echo February 2012, non ischemic myoview 12/2009.  EF 50% echo 7/12 and plans for ICD cancelled.   . Peptic ulcer disease   . PVC (premature ventricular  contraction)   . Shortness of breath   . Spinal stenosis   . Urge urinary incontinence   . Valvular heart disease    Moderate to severe MR, moderate TR, LAE per echo 7/11  . Wears dentures    upper denture-lower partial    Past Surgical History:   Past Surgical History:  Procedure Laterality Date  . ABDOMINAL HERNIA REPAIR  32 YRS AGO   PERITINITIS  . APPENDECTOMY  1979   RUPTURED  . BENIGN RIGHT BREAST TUMOR REMOVED    . CARDIOVASCULAR STRESS TEST  JULY 2011-  DR HOCHREIN   NO ISCHEMIA  . CARDIOVERSION N/A 08/17/2018   Procedure: CARDIOVERSION;  Surgeon: Sueanne Margarita, MD;  Location: [facility] ENDOSCOPY;  Service: Cardiovascular;  Laterality: N/A;  . EXPLORATOMY LAP. / EXTENSIVE LYSIS ADHESIONS/ SMALL BOWEL RESECTION X2 WITH PRIMARY ANASTOMOSIS X2  04-18-2011   SMALL BOWEL OBSTRUCTION  . GLAUCOMA SURGERY  1978  . HERNIA REPAIR    . INCISION AND DRAINAGE OF WOUND N/A 09/13/2012   Procedure: INCISION  AND DEBRIDEMENT WOUND abdominal wall ;  Surgeon: Rolm Bookbinder, MD;  Location: [place];  Service: General;  Laterality: N/A;  coordination with Dr Migdalia Dk   . KNEE ARTHROSCOPY W/ MENISCECTOMY  06-05-2004  . TEE WITHOUT CARDIOVERSION N/A 08/17/2018   Procedure: TRANSESOPHAGEAL ECHOCARDIOGRAM (TEE);  Surgeon: Sueanne Margarita, MD;  Location[facility] ENDOSCOPY;  Service: Cardiovascular;  Laterality: N/A;  . THYROIDECTOMY  1978 GOITOR   AND CERVICAL COLD KNIFE CONE BX  . TONSILLECTOMY  AGE 59  . TRANSTHORACIC ECHOCARDIOGRAM  12-27-2010   EF 45-50%/ MODERATE MV REGURG. / LEFT ATRIUM MILDLY DILATED/ MILD TRICUSPID REGURG.  . TUBAL LIGATION    . VENTRAL HERNIA REPAIR  X4  LAST ONE 20 YRS AGO   ABDOMINAL --  EACH ONE IN DIFFERENT AREA  . VENTRAL HERNIA REPAIR  01/29/2012   Procedure: HERNIA REPAIR VENTRAL ADULT;  Surgeon: Theodoro Kos, DO;  Location: [place];  Service: Plastics;  Laterality: N/A;  . WOUND DEBRIDEMENT  09/25/2011   Procedure: DEBRIDEMENT CLOSURE/ABDOMINAL WOUND;   Surgeon: Theodoro Kos, DO;  Location: [place];  Service: Plastics;  Laterality: N/A;  excision of abdominal wound with primary closure    Social History:   Social History   Socioeconomic History  . Marital status: Widowed    Spouse name: Not on file  . Number of children: 2  . Years of education: Not on file  . Highest education level: Not on file  Occupational History  . Occupation: RETIRED  Social Needs  . Financial resource strain: Not on file  . Food insecurity    Worry: Not on file    Inability: Not on file  . Transportation needs    Medical: Not on file    Non-medical:  Not on file  Tobacco Use  . Smoking status: Former Smoker    Packs/day: 1.50    Years: 45.00    Pack years: 67.50    Types: Cigarettes    Start date: 11/14/1964    Quit date: 09/22/2010    Years since quitting: 8.2  . Smokeless tobacco: Never Used  . Tobacco comment: quit October 01, 2009 or 2010/10/02 - Didn't smoke during pregnancies  Substance and Sexual Activity  . Alcohol use: No  . Drug use: No  . Sexual activity: Not Currently  Lifestyle  . Physical activity    Days per week: Not on file    Minutes per session: Not on file  . Stress: Not on file  Relationships  . Social Herbalist on phone: Not on file    Gets together: Not on file    Attends religious service: Not on file    Active member of club or organization: Not on file    Attends meetings of clubs or organizations: Not on file    Relationship status: Not on file  . Intimate partner violence    Fear of current or ex partner: Not on file    Emotionally abused: Not on file    Physically abused: Not on file    Forced sexual activity: Not on file  Other Topics Concern  . Not on file  Social History Narrative   Marital status:  Widowed as of 10-02-2010; not dating.        Children: four children (1 daughter in [place], 1 daughter in [place] estranged; 1 son in [place]; 1 son deceased in 10/02/14); six grandchildren; 5 gg.      Lives:  alone with a yorkie.  Son/Harry lives close.        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      Tobacco:  None; quit in 10-02-10.        Alcohol: none      Drugs:none      Exercise:  No formal exercise program.  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  Cannot swim.        ADLs:  Driving; cleans own house; does grocery shopping; pays bills; does not use cane for ambulation.  Drives.        Advanced Directives: no living will; no HCPOA.  DNR/DNI.        [facility]:   Originally from [facility] [facility]. Has always lived in [place]. She moved to [place], [place]. 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 Has a dog currently. No bird or mold exposure.     Allergies   Aspirin, Nsaids, and Tolmetin  Family history:   Family History  Problem Relation Age of Onset  . Stroke Brother   . Diabetes Brother   . Tuberculosis Father   . Stroke Mother   . Stroke Sister   . Heart disease Sister   . Emphysema Sister   . Diabetes Daughter     Current Medications:   Prior to Admission medications   Medication Sig Start Date End Date Taking? Authorizing Provider  albuterol (PROAIR HFA) 108 (90 Base) MCG/ACT inhaler USE 2 PUFFS EVERY 6 HOURS AS NEEDED FOR SHORTNESS OF BREATH AND WHEEZING. 10/05/18  Yes Stallings, Zoe A, MD  amiodarone (PACERONE) 200 MG tablet Take 1 tablet (200 mg total) by mouth 2 (two) times daily. 10/20/18  Yes Seawell, Jaimie A, DO  apixaban (ELIQUIS) 5 MG TABS tablet  Take 1 tablet (5 mg total) by mouth 2 (two) times daily. 10/20/18  Yes Seawell, Jaimie A, DO  busPIRone (BUSPAR) 5 MG tablet Take 1 tablet (5 mg total) by mouth 2 (two) times daily. 06/07/18  Yes [name], MD  citalopram (CELEXA) 20 MG tablet TAKE 1 TABLET EACH DAY. Patient taking differently: Take 20 mg by mouth daily.  06/28/18  Yes [name], MD  doxylamine, Sleep, (UNISOM) 25 MG tablet Take 25 mg by mouth at bedtime as needed for  sleep.   Yes [provider]  famotidine (PEPCID) 20 MG tablet Take 1 tablet (20 mg total) by mouth at bedtime. 12/01/18  Yes [name], MD  HYDROcodone-acetaminophen (NORCO/VICODIN) 5-325 MG tablet Take 1 tablet by mouth 5 (five) times daily.    Yes [provider]  hydrOXYzine (ATARAX/VISTARIL) 10 MG tablet TAKE 1 TABLET TWICE DAILY AS NEEDED FOR ANXIETY. Patient taking differently: Take 10 mg by mouth 2 (two) times daily as needed for anxiety.  11/04/18  Yes [name], MD  levothyroxine (SYNTHROID) 100 MCG tablet TAKE 1 TABLET ONCE DAILY BEFORE BREAKFAST. 11/03/18  Yes [name], MD  metoprolol succinate (TOPROL-XL) 100 MG 24 hr tablet Take 1 tablet (100 mg total) by mouth daily. Take with or immediately following a meal. 10/21/18  Yes Seawell, Jaimie A, DO  mirtazapine (REMERON) 7.5 MG tablet Take 1 tablet (7.5 mg total) by mouth at bedtime. 12/03/18  Yes [name], MD  potassium chloride (MICRO-K) 10 MEQ CR capsule Take 1 capsule (10 mEq total) by mouth 2 (two) times daily. 11/26/18  Yes [name], MD  torsemide (DEMADEX) 20 MG tablet Take 2 tablets (40 mg total) by mouth daily. 10/20/18  Yes Seawell, Jaimie A, DO  Spacer/Aero-Holding Chambers (AEROCHAMBER MV) inhaler Use as instructed 05/01/16   Javier Glazier, MD    Physical Exam:   Vitals:   12/28/18 1315 12/28/18 1330 12/28/18 1345 12/28/18 1400  BP: 104/78 99/75 (!) 89/76 104/60  Pulse: 90 85 87 91  Resp: 17 19 18  (!) 21  Temp:      TempSrc:      SpO2: 93%     Weight:      Height:         Physical Exam: Blood pressure 104/60, pulse 91, temperature (!) 96.4 F (35.8 C), temperature source Rectal, resp. rate (!) 21, height 5\' 3"  (1.6 m), weight 94.3 kg, SpO2 93 %. Gen: Very tired appearing lethargic patient lying in bed in no respiratory distress. Eyes: Sclerae anicteric. Conjunctiva mildly injected. Chest: Decreased air entry bilaterally likely secondary to decrease in story  effort.  He does have some rales at bases. CV: Distant, regular, 2/6 systolic murmur loudest at apex.   Abdomen: NABS, soft, nondistended, nontender. No tenderness to light or deep palpation. No rebound, no guarding. Extremities: No edema.  Neuro: Patient is awake but tired, she somewhat lethargic and very slow to answer.  She is nonfocal.   Data Review:    Labs: Basic Metabolic Panel: Recent Labs  Lab 12/28/18 1000  NA 136  K 5.8*  CL 97*  CO2 16*  GLUCOSE 37*  BUN 62*  CREATININE 4.82*  CALCIUM 9.4   Liver Function Tests: Recent Labs  Lab 12/28/18 1000  AST 405*  ALT 287*  ALKPHOS 269*  BILITOT 2.9*  PROT 6.6  ALBUMIN 3.9   No results for input(s): LIPASE, AMYLASE in the last 168 hours. No results for input(s):  AMMONIA in the last 168 hours. CBC: Recent Labs  Lab 12/28/18 1000  WBC 9.3  HGB 16.1*  HCT 51.9*  MCV 97.4  PLT 213   Cardiac Enzymes: No results for input(s): CKTOTAL, CKMB, CKMBINDEX, TROPONINI in the last 168 hours.  BNP (last 3 results) No results for input(s): PROBNP in the last 8760 hours. CBG: Recent Labs  Lab 12/28/18 0950 12/28/18 1035 12/28/18 1308  GLUCAP 37* 150* 120*    Urinalysis    Component Value Date/Time   COLORURINE YELLOW 08/10/2018 1955   APPEARANCEUR CLEAR 08/10/2018 1955   APPEARANCEUR Clear 12/21/2017 1824   LABSPEC 1.015 08/10/2018 1955   PHURINE 5.0 08/10/2018 1955   GLUCOSEU NEGATIVE 08/10/2018 1955   HGBUR NEGATIVE 08/10/2018 1955   BILIRUBINUR NEGATIVE 08/10/2018 1955   BILIRUBINUR Negative 12/21/2017 [place] 08/10/2018 1955   PROTEINUR NEGATIVE 08/10/2018 1955   UROBILINOGEN 0.2 12/23/2016 1317   UROBILINOGEN 0.2 05/27/2011 1835   NITRITE NEGATIVE 08/10/2018 1955   LEUKOCYTESUR NEGATIVE 08/10/2018 1955      Radiographic Studies: Ct Abdomen Wo Contrast  Result Date: 12/28/2018 CLINICAL DATA:  Elevated liver function tests. Nausea vomiting. EXAM: CT ABDOMEN W ITHOUT CONTRAST  TECHNIQUE: Multidetector CT imaging of the abdomen was performed following the standard protocol without IV contrast. COMPARISON:  August 30, 2015 FINDINGS: Lower chest: Partially visualized area of scarring/atelectasis in the lingula. Atelectasis versus airspace consolidation in the dependent portion of the left lower lobe. Enlarged heart. Minimal pericardial thickening versus tiny effusion. Calcific atherosclerotic disease of the aorta and coronary arteries, mild. Hepatobiliary: Normal appearance of the liver. The gallbladder is not abnormally distended but demonstrates a hyperdense appearance. Pancreas: Fatty replaced pancreas. Spleen: Normal in size without focal abnormality. Adrenals/Urinary Tract: Mild left adrenal thickening. Normal nonenhanced appearance of the kidneys. Stomach/Bowel: Stomach is within normal limits. No evidence of bowel wall thickening, distention, or inflammatory changes. Vascular/Lymphatic: Aortic atherosclerosis. No enlarged abdominal lymph nodes. Other: No abdominal wall hernia or abnormality. Musculoskeletal: Spondylosis the lumbosacral spine. IMPRESSION: 1. Unusual hyperdense appearance of the gallbladder. If the patient has had recent administration of IV contrast, this may represent vicarious excretion of contrast. Otherwise MRCP or ERCP may be considered for further investigation. 2. Enlarged heart. Minimal pericardial thickening versus tiny effusion. 3. Partially visualized area of scarring/atelectasis in the lingula. 4. Atelectasis versus airspace consolidation in the dependent portion of the left lower lobe. 5. Calcific atherosclerotic disease of the aorta and coronary arteries. Electronically Signed   By: Fidela Salisbury M.D.   On: 12/28/2018 14:22   Dg Chest Port 1 View  Result Date: 12/28/2018 CLINICAL DATA:  Generalized weakness for 3 days. EXAM: PORTABLE CHEST 1 VIEW COMPARISON:  10/15/2018 FINDINGS: The heart is enlarged but stable. There is tortuosity and  calcification of the thoracic aorta. The lungs are grossly clear. No infiltrates, edema or effusions. The bony thorax is intact. IMPRESSION: Stable cardiac enlargement but no acute pulmonary findings. Electronically Signed   By: Marijo Sanes M.D.   On: 12/28/2018 10:40    EKG: Independently reviewed.  EKG #1 done at 949.  Irregular wide-complex rhythm at 100. EKG #2 done at 1305.  Atrial fibrillation at 90.  Right bundle branch block.  She has T wave inversion at II and V6.   Assessment/Plan:   Principal Problem:   Hypotension due to hypovolemia Active Problems:   Nonischemic cardiomyopathy (HCC)   Malaise and fatigue   Essential (primary) hypertension   Chronic systolic CHF (congestive heart failure),  NYHA class 2 (HCC)   Moderate mitral regurgitation   Diabetes mellitus type 2, diet-controlled (HCC)   Hypothyroidism   Anxiety and depression   Acute renal failure (ARF) (HCC)   Atrial fibrillation ([place])   Transaminitis   79 year old female with multiple medical problems including hypertension, diabetes, CAD, HFrEF, moderate MR and atrial fibrillation presents with hypotension, acute renal failure, hypoglycemia and altered mental status after 10 days of very little p.o. intake secondary to anorexia most likely due to the death of her daughter.  Patient denies suicidal intent.  SEPSIS Patient does clearly meet sepsis criteria however I do not believe this is infection mediated as we have no clear source of infection and her procalcitonin is relatively low. Lactic acidosis is most likely secondary to tissue ischemia from prolonged hypotension and/or ongoing use of metformin in the setting of kidney failure. Nonetheless we will continue cefepime and vancomycin tonight until we can repeat a procalcitonin in the morning to ensure that it is going down. Continue fluid resuscitation as initiated.  ARF Patient has been continuing to take torsemide 40 mg p.o. daily despite not eating or  drinking very much. Will send urine for UA, look for muddy brown casts to help differentiate between prerenal causes versus ATN.  In the meanwhile she is getting aggressively fluid resuscitation which hopefully will help. Patient has been taking her antihypertensives despite markedly decreased p.o. intake and likely severe intravascular volume depletion.  HYPOGLYCEMIA/DM She has been continue to take her metformin despite decreased p.o. intake although this should not really cause hypoglycemia per se.  Hypoglycemia should improve with increased caloric intake. We will check fingersticks every 4 hours for the first 24 hours given initial hypoglycemia This can be changed to [facility] at bedtime as warranted Hold metformin  ABNORMAL EKG Wide complex initially, resolved Patient also has RBBB and LAFB and atrial fibrillation clearly has conductive system disease  AFIB Patient has had significant difficulty with rate control in the past. We will continue amiodarone and metoprolol for rate control as blood pressure has responded well to fluid resuscitation.  Continue Eliquis for secondary prevention of stroke.  HYPOMAGNESEMIA Magnesium is 1.5, will give 2 g IV and recheck in the morning Of note patient is on amiodarone so will need to keep magnesium on the higher end of normal.  TRANSAMINITIS Very likely secondary to shock liver/profound hypotension Will recheck in the morning, can check liver ultrasound later if warranted Patient son states she does not drink any alcohol.  ABNORMAL TROPONIN Troponin 49 and has decreased to 31.  There are some minimal ST depressions in V6 on EKG.  If this is anything this is most likely secondary to profound hypotension.  No evidence for any ACS.  HFrEF Patient with nonischemic cardiomyopathy, echocardiogram done March 2020 showed EF of 45 to 55% as well as moderate to severe MR.. No evidence for acute decompensation at present however will need to follow closely  as she is fluid resuscitated.  Hold torsemide for now.  ANXIETY AND DEPRESSION Patient admits to depressed mood however denies any suicidal intent. Continue BuSpar Celexa and Remeron She will benefit from inpatient psychiatry evaluation and treatment if available  HYPOTHYROIDISM Continue Synthroid    Other information:   DVT prophylaxis: On Eliquis Code Status: Full code after discussion with her son, she had been DNR previously but apparently had not wanted to be DNR per her sons communication. Family Communication: Spoke with patient's son at length. Disposition Plan: Home Consults called: None  Admission status: Inpatient  The medical decision making on this patient was of high complexity and the patient is at high risk for clinical deterioration, therefore this is a level 3 visit.   Dewaine Oats Tublu Yazmeen Woolf [facility]  If 7PM-7AM, please contact night-coverage [email or web address] Password [facility] 12/28/2018, 2:38 PM

## 2018-12-28 NOTE — Progress Notes (Signed)
Daily Nursing Note  Patient report received from [place]. Patient assessed noted to be in NAD. Ecchymosis on L knee prior mid abd scar noted. Hypotensive upon admission to the unit, Dr. Jamse Arn informed 1L bolus ordered with good response to 90's-140's --> given evening medications and NS placed at 168m/hr. Gotten OOB to use the BS commode. SBP decreased again possibly in the setting of MTP administration in conjunction w/ original hypovolemia --> 1L bolus given (5.5L since admission). Patient unable to have BM though did urinate --> had PVR of 535m Endorses not feeling hungry. Monitoring closely for sufficient UOP,  to keep MAPS > 60, and SPO2 > 92%. All patient needs met during shift.

## 2018-12-28 NOTE — ED Provider Notes (Signed)
[facility] EMERGENCY DEPARTMENT Provider Note   CSN: [id number] Arrival date & time: 12/28/18  3545     History   Chief Complaint No chief complaint on file.   HPI Teresa [name] is a 78 y.o. female.     HPI  78 yo female presents daughter died last weekend 2023/01/13) , patient has had poor appetite since then and has not had much oral intake but has been taking water and diet pepsi.  Son notes she has had increased peripheral edema.  She has been taking lasix with good control of edema prior to this since last hospitalization taking 40 mg. She has had increased weakness and has been unable to walk with walker past 1-2 days.  Today unable to walk with assistance. He reports she previously had a sbo that required resection, but does not think gallbladder has been removed.  No fever, chest pain, dyspnea, cough, vomited one time yesterday.  No bowel movement for several days.  PMD Dr. Pamella Pert [facility] [place]  Past Medical History:  Diagnosis Date   Abdominal pain    Anxiety    maintained on Xanax tid for years.   Arthritis    FEET, KNEES, HANDS   Atrial fibrillation ([place]) 62/5638   Chronic systolic CHF (congestive heart failure), NYHA class 2 (HCC)    EF 25% 2011 MV,  50-55% echo 6/13   Diabetes mellitus type 2, diet-controlled (HCC)    GERD (gastroesophageal reflux disease)    Glaucoma    s/p surgery B in 30s.  [place].   H/O hiatal hernia    History of glaucoma    History of small bowel obstruction 2009 AND NOV 2012   HTN (hypertension)    Hypothyroidism    Left ovarian cyst    Moderate mitral regurgitation    Moderate tricuspid regurgitation    Non-healing surgical wound    ABDOMINAL S/P EXPLORATOY LAPAROTOMY 04-18-2011   Nonischemic cardiomyopathy ([place]) DR [facility]    EF is 25% per echo February 2012, non ischemic myoview 12/2009.  EF 50% echo 7/12 and plans for ICD cancelled.    Peptic ulcer disease    PVC (premature ventricular  contraction)    Shortness of breath    Spinal stenosis    Urge urinary incontinence    Valvular heart disease    Moderate to severe MR, moderate TR, LAE per echo 7/11   Wears dentures    upper denture-lower partial    Patient Active Problem List   Diagnosis Date Noted   Transaminitis 10/15/2018   Thrombocytopenia ([place]) 10/15/2018   Atrial fibrillation ([place]) 10/13/2018   Acute on chronic congestive heart failure ([place])    AKI (acute kidney injury) ([place])    Hypomagnesemia    Pressure injury of skin 08/12/2018   Acute renal failure (ARF) ([place]) 08/10/2018   Hypotension due to hypovolemia 08/10/2018   Hypokalemia 08/10/2018   Hyponatremia 93/73/4287   Metabolic acidosis, increased anion gap 08/10/2018   Hypocalcemia 08/10/2018   Elevated troponin 08/10/2018   Prolonged QT interval 08/10/2018   Atrial fibrillation with RVR ([place]) 08/10/2018   Pancreatic pseudocyst 11/09/2017   Polymyalgia rheumatica ([place]) 10/12/2016   Anxiety and depression 10/12/2016   Bilateral hip pain 06/24/2016   Chronic pain of both shoulders 06/24/2016   Psychophysiological insomnia 05/12/2016   Spinal stenosis    History of glaucoma    History of small bowel obstruction    Peptic ulcer disease    Moderate mitral regurgitation  Moderate tricuspid regurgitation    H/O hiatal hernia    Urge urinary incontinence    Left ovarian cyst    Diabetes mellitus type 2, diet-controlled (HCC)    Shortness of breath    Wears dentures    Hypothyroidism    Generalized anxiety disorder 09/15/2013   Hernia, incisional 05/10/2012   Abdominal wall hernia 02/10/2012   Dyslipidemia 05/01/2011   GERD (gastroesophageal reflux disease) 05/01/2011   Osteoarthritis 05/01/2011   Chronic renal insufficiency 05/01/2011   HLD (hyperlipidemia) 05/01/2011   SBO (small bowel obstruction), s/p expl lap 04/24/2011   Nonischemic cardiomyopathy (HCC)    Arthritis of ankle or  foot, degenerative 07/12/2010   Arthritis of hand, degenerative 07/12/2010   Malaise and fatigue 07/12/2010   Essential (primary) hypertension 07/12/2010   Glaucoma 07/12/2010    Past Surgical History:  Procedure Laterality Date   ABDOMINAL HERNIA REPAIR  32 YRS AGO   [place]   RUPTURED   BENIGN RIGHT BREAST TUMOR REMOVED     CARDIOVASCULAR STRESS TEST  JULY 2011-  DR HOCHREIN   NO ISCHEMIA   CARDIOVERSION N/A 08/17/2018   Procedure: CARDIOVERSION;  Surgeon: Sueanne Margarita, MD;  Location: [place];  Service: Cardiovascular;  Laterality: N/A;   EXPLORATOMY LAP. / EXTENSIVE LYSIS ADHESIONS/ SMALL BOWEL RESECTION X2 WITH PRIMARY ANASTOMOSIS X2  04-18-2011   SMALL BOWEL OBSTRUCTION   GLAUCOMA SURGERY  1978   HERNIA REPAIR     INCISION AND DRAINAGE OF WOUND N/A 09/13/2012   Procedure: INCISION  AND DEBRIDEMENT WOUND abdominal wall ;  Surgeon: Rolm Bookbinder, MD;  Location: [place];  Service: General;  Laterality: N/A;  coordination with Dr Migdalia Dk    KNEE ARTHROSCOPY W/ MENISCECTOMY  06-05-2004   TEE WITHOUT CARDIOVERSION N/A 08/17/2018   Procedure: TRANSESOPHAGEAL ECHOCARDIOGRAM (TEE);  Surgeon: Sueanne Margarita, MD;  Location: [facility];  Service: Cardiovascular;  Laterality: N/A;   [place]  AGE 27   TRANSTHORACIC ECHOCARDIOGRAM  12-27-2010   EF 45-50%/ MODERATE MV REGURG. / LEFT ATRIUM MILDLY DILATED/ MILD TRICUSPID [place].   TUBAL LIGATION     VENTRAL HERNIA REPAIR  X4  LAST ONE 20 YRS AGO   ABDOMINAL --  EACH ONE IN DIFFERENT AREA   VENTRAL HERNIA REPAIR  01/29/2012   Procedure: HERNIA REPAIR VENTRAL ADULT;  Surgeon: Theodoro Kos, DO;  Location: [place];  Service: Plastics;  Laterality: N/A;   WOUND DEBRIDEMENT  09/25/2011   Procedure: DEBRIDEMENT CLOSURE/ABDOMINAL WOUND;  Surgeon: Theodoro Kos, DO;  Location: [place];   Service: Plastics;  Laterality: N/A;  excision of abdominal wound with primary closure     OB History   No obstetric history on file.      Home Medications    Prior to Admission medications   Medication Sig Start Date End Date Taking? Authorizing Provider  albuterol (PROAIR HFA) 108 (90 Base) MCG/ACT inhaler USE 2 PUFFS EVERY 6 HOURS AS NEEDED FOR SHORTNESS OF BREATH AND WHEEZING. 10/05/18  Yes Stallings, Zoe A, MD  amiodarone (PACERONE) 200 MG tablet Take 1 tablet (200 mg total) by mouth 2 (two) times daily. 10/20/18  Yes Seawell, Jaimie A, DO  apixaban (ELIQUIS) 5 MG TABS tablet Take 1 tablet (5 mg total) by mouth 2 (two) times daily. 10/20/18  Yes Seawell, Jaimie A, DO  busPIRone (BUSPAR) 5 MG tablet Take 1 tablet (5 mg total)  by mouth 2 (two) times daily. 06/07/18  Yes [name], MD  citalopram (CELEXA) 20 MG tablet TAKE 1 TABLET EACH DAY. Patient taking differently: Take 20 mg by mouth daily.  06/28/18  Yes [name], MD  doxylamine, Sleep, (UNISOM) 25 MG tablet Take 25 mg by mouth at bedtime as needed for sleep.   Yes [provider]  famotidine (PEPCID) 20 MG tablet Take 1 tablet (20 mg total) by mouth at bedtime. 12/01/18  Yes [name], MD  HYDROcodone-acetaminophen (NORCO/VICODIN) 5-325 MG tablet Take 1 tablet by mouth 5 (five) times daily.    Yes [provider]  hydrOXYzine (ATARAX/VISTARIL) 10 MG tablet TAKE 1 TABLET TWICE DAILY AS NEEDED FOR ANXIETY. Patient taking differently: Take 10 mg by mouth 2 (two) times daily as needed for anxiety.  11/04/18  Yes [name], MD  levothyroxine (SYNTHROID) 100 MCG tablet TAKE 1 TABLET ONCE DAILY BEFORE BREAKFAST. 11/03/18  Yes [name], MD  metoprolol succinate (TOPROL-XL) 100 MG 24 hr tablet Take 1 tablet (100 mg total) by mouth daily. Take with or immediately following a meal. 10/21/18  Yes Seawell, Jaimie A, DO  mirtazapine (REMERON) 7.5 MG tablet Take 1 tablet (7.5 mg total) by mouth at  bedtime. 12/03/18  Yes [name], MD  potassium chloride (MICRO-K) 10 MEQ CR capsule Take 1 capsule (10 mEq total) by mouth 2 (two) times daily. 11/26/18  Yes [name], MD  torsemide (DEMADEX) 20 MG tablet Take 2 tablets (40 mg total) by mouth daily. 10/20/18  Yes Seawell, Jaimie A, DO  Spacer/Aero-Holding Chambers (AEROCHAMBER MV) inhaler Use as instructed 05/01/16   Javier Glazier, MD    Family History Family History  Problem Relation Age of Onset   Stroke Brother    Diabetes Brother    Tuberculosis Father    Stroke Mother    Stroke Sister    Heart disease Sister    Emphysema Sister    Diabetes Daughter     Social History Social History   Tobacco Use   Smoking status: Former Smoker    Packs/day: 1.50    Years: 45.00    Pack years: 67.50    Types: Cigarettes    Start date: 11/14/1964    Quit date: 09/22/2010    Years since quitting: 8.2   Smokeless tobacco: Never Used   Tobacco comment: quit 2011 or 2012 - Didn't smoke during pregnancies  Substance Use Topics   Alcohol use: No   Drug use: No     Allergies   Aspirin, Nsaids, and Tolmetin   Review of Systems Review of Systems   Physical Exam Updated Vital Signs BP (!) 89/67    Pulse (!) 105    Temp (!) 96.4 F (35.8 C) (Rectal)    Resp 14    SpO2 99%   Physical Exam Vitals signs and nursing note reviewed.  Constitutional:      General: She is in acute distress.     Appearance: She is ill-appearing and diaphoretic.  HENT:     Head: Normocephalic and atraumatic.     Right Ear: External ear normal.     Left Ear: External ear normal.     Mouth/Throat:     Mouth: Mucous membranes are dry.  Eyes:     Pupils: Pupils are equal, round, and reactive to light.  Neck:     Musculoskeletal: Normal range of motion.  Cardiovascular:     Rate and Rhythm: Normal  rate and regular rhythm.  Pulmonary:     Effort: Pulmonary effort is normal.     Comments: Decreased breath sounds bilateral  bases Abdominal:     Palpations: Abdomen is soft.     Comments: Bowel sounds are decreased Abdomen soft no tenderness   Musculoskeletal:        General: Swelling present.     Right lower leg: Edema present.     Left lower leg: Edema present.  Skin:    Capillary Refill: Capillary refill takes 2 to 3 seconds.     Coloration: Skin is pale.     Comments: Lower extremities are mottled  Neurological:     General: No focal deficit present.     Cranial Nerves: No cranial nerve deficit.     Motor: Weakness present.      ED Treatments / Results  Labs (all labs ordered are listed, but only abnormal results are displayed) Labs Reviewed  CBC - Abnormal; Notable for the following components:      Result Value   RBC 5.33 (*)    Hemoglobin 16.1 (*)    HCT 51.9 (*)    All other components within normal limits  COMPREHENSIVE METABOLIC PANEL - Abnormal; Notable for the following components:   Potassium 5.8 (*)    Chloride 97 (*)    CO2 16 (*)    Glucose, Bld 37 (*)    BUN 62 (*)    Creatinine, Ser 4.82 (*)    AST 405 (*)    ALT 287 (*)    Alkaline Phosphatase 269 (*)    Total Bilirubin 2.9 (*)    GFR calc non Af Amer 8 (*)    GFR calc Af Amer 9 (*)    Anion gap 23 (*)    All other components within normal limits  CBG MONITORING, ED - Abnormal; Notable for the following components:   Glucose-Capillary 37 (*)    All other components within normal limits  CBG MONITORING, ED - Abnormal; Notable for the following components:   Glucose-Capillary 150 (*)    All other components within normal limits  TROPONIN I (HIGH SENSITIVITY) - Abnormal; Notable for the following components:   Troponin I (High Sensitivity) 49 (*)    All other components within normal limits  SARS CORONAVIRUS 2 (HOSPITAL ORDER, [place] [facility])  CULTURE, BLOOD (ROUTINE X 2)  CULTURE, BLOOD (ROUTINE X 2)  LACTIC ACID, PLASMA  I-STAT CHEM 8, ED    EKG EKG  Interpretation  Date/Time:  Tuesday December 28 2018 09:49:11 EDT Ventricular Rate:  98 PR Interval:    QRS Duration: 211 QT Interval:  506 QTC Calculation: 647 R Axis:   -75 Text Interpretation:  Atrial fibrillation wide complex Confirmed by Pattricia Boss [id number]) on 12/28/2018 9:59:15 AM   Radiology Dg Chest Port 1 View  Result Date: 12/28/2018 CLINICAL DATA:  Generalized weakness for 3 days. EXAM: PORTABLE CHEST 1 VIEW COMPARISON:  10/15/2018 FINDINGS: The heart is enlarged but stable. There is tortuosity and calcification of the thoracic aorta. The lungs are grossly clear. No infiltrates, edema or effusions. The bony thorax is intact. IMPRESSION: Stable cardiac enlargement but no acute pulmonary findings. Electronically Signed   By: Marijo Sanes M.D.   On: 12/28/2018 10:40    Procedures .Critical Care Performed by: Pattricia Boss, MD Authorized by: Pattricia Boss, MD   Critical care provider statement:    Critical care time (minutes):  75   Critical care was necessary  to treat or prevent imminent or life-threatening deterioration of the following conditions:  Circulatory failure, CNS failure or compromise, dehydration, metabolic crisis, sepsis and shock   Critical care was time spent personally by me on the following activities:  Discussions with consultants, evaluation of patient's response to treatment, examination of patient, ordering and performing treatments and interventions, ordering and review of laboratory studies, ordering and review of radiographic studies, pulse oximetry, re-evaluation of patient's condition, obtaining history from patient or surrogate and review of old charts   (including critical care time)  Medications Ordered in ED Medications  dextrose 50 % solution (  Not Given 12/28/18 1010)  sodium chloride 0.9 % bolus 500 mL (0 mLs Intravenous Stopped 12/28/18 1127)  dextrose 50 % solution 50 mL ( Intravenous Given 12/28/18 0956)     Initial Impression /  Assessment and Plan / ED Course  I have reviewed the triage vital signs and the nursing notes.  Pertinent labs & imaging results that were available during my care of the patient were reviewed by me and considered in my medical decision making (see chart for details).      78 yo female with hypotension, widened qrs on ekg, with reported poor po intake for 1-2 weeks.  Labs c.w. new onset renal failure-? Prerenal given poor po intake.  She presented with hypoglycemia which resolved with d50 .  Transaminases elevate- unclear if cause or due to patient's hypotesion- will evaluate abdomen with ct. Patient with known history of afib rbbb now with increased qrs.  Patient mildly hyperkalemic which may cause widened qrs. Patient with increased troponin- will consult cardiology. ?abdominal infection vs sbo as etiology of anorexia- ct pending. Given elevated lactic,hypothermia,  and hypotension will treate as sepsis with iv fluids and broad spectrum abx  Plan consult to cardiology and medicine Will reevaluate after fluids and ct scan.  1:10 PM Patient more alert color improved Blood pressure 100/80 Heart rate 90s Repeat EKG obtained QRS has narrowed and continues to have nonspecific ST-T wave changes Discussed with cardiology and they agree that this is likely a metabolic etiology. Patient's abdomen continues soft and nontender CT pending   Discussed with Jamse Arn and will see for admission  Final Clinical Impressions(s) / ED Diagnoses   Final diagnoses:  COVID-19  Acute renal failure, unspecified acute renal failure type ([place])  Abnormal EKG  Hyperkalemia    ED Discharge Orders    None       Pattricia Boss, MD 12/28/18 1333

## 2018-12-28 NOTE — ED Notes (Signed)
Patient transported to CT 

## 2018-12-28 NOTE — ED Notes (Signed)
ED TO INPATIENT HANDOFF REPORT  ED Nurse Name and Phone #: Judson Roch [phone]  S Name/Age/Gender Teresa [name] 78 y.o. female Room/Bed: RESUSC/RESUSC  Code Status   Code Status: Prior  Home/SNF/Other Home Patient oriented to: self, place and situation Is this baseline? Yes   Triage Complete: Triage complete  Chief Complaint V tach  Triage Note Pt here from home for generalized weakness and edema x 3 days, no bowel movement x 3 days. Usually able to ambulate, cannot stand now. CBG 41, glucagon given. Occasional runs of vtach. Otherwise afib 80-140. Pt c/o shob. 92% room air.    Allergies Allergies  Allergen Reactions  . Aspirin Other (See Comments)    HX Bleeding Ulcer   . Nsaids Other (See Comments)    Hx of bleeding ulcers  . Tolmetin Rash    Hx of bleeding ulcers    Level of Care/Admitting Diagnosis ED Disposition    ED Disposition Condition [facility]: [place] [[id number]]  Level of Care: Progressive [102]  Covid Evaluation: Confirmed COVID Negative  Diagnosis: Sepsis [facility]) [[id number]]  Admitting Physician: Vashti Hey [[id number]]  Attending Physician: Vashti Hey [[id number]]  Estimated length of stay: 3 - 4 days  Certification:: I certify this patient will need inpatient services for at least 2 midnights  PT Class (Do Not Modify): Inpatient [101]  PT Acc Code (Do Not Modify): Private [1]       B Medical/Surgery History Past Medical History:  Diagnosis Date  . Abdominal pain   . Anxiety    maintained on Xanax tid for years.  . Arthritis    FEET, KNEES, HANDS  . Atrial fibrillation ([place]) 09/2018  . Chronic systolic CHF (congestive heart failure), NYHA class 2 ([place])    EF 25% 2011 MV,  50-55% echo 6/13  . Diabetes mellitus type 2, diet-controlled ([facility])   . GERD (gastroesophageal reflux disease)   . Glaucoma    s/p surgery B in 30s.  [place].  . H/O hiatal hernia   . History of glaucoma   .  History of small bowel obstruction 2009 AND NOV 2012  . HTN (hypertension)   . Hypothyroidism   . Left ovarian cyst   . Moderate mitral regurgitation   . Moderate tricuspid regurgitation   . Non-healing surgical wound    ABDOMINAL S/P EXPLORATOY LAPAROTOMY 04-18-2011  . Nonischemic cardiomyopathy ([place]) DR [facility]    EF is 25% per echo February 2012, non ischemic myoview 12/2009.  EF 50% echo 7/12 and plans for ICD cancelled.   . Peptic ulcer disease   . PVC (premature ventricular contraction)   . Shortness of breath   . Spinal stenosis   . Urge urinary incontinence   . Valvular heart disease    Moderate to severe MR, moderate TR, LAE per echo 7/11  . Wears dentures    upper denture-lower partial   Past Surgical History:  Procedure Laterality Date  . ABDOMINAL HERNIA REPAIR  32 YRS AGO   PERITINITIS  . APPENDECTOMY  1979   RUPTURED  . BENIGN RIGHT BREAST TUMOR REMOVED    . CARDIOVASCULAR STRESS TEST  JULY 2011-  DR HOCHREIN   NO ISCHEMIA  . CARDIOVERSION N/A 08/17/2018   Procedure: CARDIOVERSION;  Surgeon: Sueanne Margarita, MD;  [facility]: [facility] ENDOSCOPY;  Service: Cardiovascular;  Laterality: N/A;  . EXPLORATOMY LAP. / EXTENSIVE LYSIS ADHESIONS/ SMALL BOWEL RESECTION X2 WITH PRIMARY ANASTOMOSIS X2  04-18-2011   SMALL BOWEL  OBSTRUCTION  . GLAUCOMA SURGERY  1978  . HERNIA REPAIR    . INCISION AND DRAINAGE OF WOUND N/A 09/13/2012   Procedure: INCISION  AND DEBRIDEMENT WOUND abdominal wall ;  Surgeon: Rolm Bookbinder, MD;  Location: [place];  Service: General;  Laterality: N/A;  coordination with Dr Migdalia Dk   . KNEE ARTHROSCOPY W/ MENISCECTOMY  06-05-2004  . TEE WITHOUT CARDIOVERSION N/A 08/17/2018   Procedure: TRANSESOPHAGEAL ECHOCARDIOGRAM (TEE);  Surgeon: Sueanne Margarita, MD;  Location: [facility] ENDOSCOPY;  Service: Cardiovascular;  Laterality: N/A;  . THYROIDECTOMY  1978 GOITOR   AND CERVICAL COLD KNIFE CONE BX  . TONSILLECTOMY  AGE 13  . TRANSTHORACIC ECHOCARDIOGRAM  12-27-2010   EF  45-50%/ MODERATE MV REGURG. / LEFT ATRIUM MILDLY DILATED/ MILD TRICUSPID REGURG.  . TUBAL LIGATION    . VENTRAL HERNIA REPAIR  X4  LAST ONE 20 YRS AGO   ABDOMINAL --  EACH ONE IN DIFFERENT AREA  . VENTRAL HERNIA REPAIR  01/29/2012   Procedure: HERNIA REPAIR VENTRAL ADULT;  Surgeon: Theodoro Kos, DO;  Location: [place];  Service: Plastics;  Laterality: N/A;  . WOUND DEBRIDEMENT  09/25/2011   Procedure: DEBRIDEMENT CLOSURE/ABDOMINAL WOUND;  Surgeon: Theodoro Kos, DO;  Location: [place];  Service: Plastics;  Laterality: N/A;  excision of abdominal wound with primary closure     A IV Location/Drains/Wounds Patient Lines/Drains/Airways Status   Active Line/Drains/Airways    Name:   Placement date:   Placement time:   Site:   Days:   Peripheral IV 12/28/18 Right Antecubital   12/28/18    0947    Antecubital   less than 1   Peripheral IV 12/28/18 Left Arm   12/28/18    1000    Arm   less than 1          Intake/Output Last 24 hours  Intake/Output Summary (Last 24 hours) at 12/28/2018 1501 Last data filed at 12/28/2018 1403 Gross per 24 hour  Intake 2886.47 ml  Output -  Net 2886.47 ml    Labs/Imaging Results for orders placed or performed during the hospital encounter of 12/28/18 (from the past 48 hour(s))  CBG monitoring, ED     Status: Abnormal   Collection Time: 12/28/18  9:50 AM  Result Value Ref Range   Glucose-Capillary 37 (LL) 70 - 99 mg/dL   Comment 1 Notify RN    Comment 2 Document in Chart   CBC     Status: Abnormal   Collection Time: 12/28/18 10:00 AM  Result Value Ref Range   WBC 9.3 4.0 - 10.5 K/uL   RBC 5.33 (H) 3.87 - 5.11 MIL/uL   Hemoglobin 16.1 (H) 12.0 - 15.0 g/dL   HCT 51.9 (H) 36.0 - 46.0 %   MCV 97.4 80.0 - 100.0 fL   MCH 30.2 26.0 - 34.0 pg   MCHC 31.0 30.0 - 36.0 g/dL   RDW 15.5 11.5 - 15.5 %   Platelets 213 150 - 400 K/uL   nRBC 0.0 0.0 - 0.2 %    Comment: Performed at [facility], [place] [address]  [place], [place] [address]  Comprehensive metabolic panel     Status: Abnormal   Collection Time: 12/28/18 10:00 AM  Result Value Ref Range   Sodium 136 135 - 145 mmol/L   Potassium 5.8 (H) 3.5 - 5.1 mmol/L   Chloride 97 (L) 98 - 111 mmol/L   CO2 16 (L) 22 - 32 mmol/L   Glucose,  Bld 37 (LL) 70 - 99 mg/dL    Comment: NO VISIBLE HEMOLYSIS CRITICAL RESULT CALLED TO, READ BACK BY AND VERIFIED WITH: EASLEY,J RN @1115  ON [id number] BY FLEMINGS    BUN 62 (H) 8 - 23 mg/dL   Creatinine, Ser 4.82 (H) 0.44 - 1.00 mg/dL   Calcium 9.4 8.9 - 10.3 mg/dL   Total Protein 6.6 6.5 - 8.1 g/dL   Albumin 3.9 3.5 - 5.0 g/dL   AST 405 (H) 15 - 41 U/L   ALT 287 (H) 0 - 44 U/L   Alkaline Phosphatase 269 (H) 38 - 126 U/L   Total Bilirubin 2.9 (H) 0.3 - 1.2 mg/dL   GFR calc non Af Amer 8 (L) >60 mL/min   GFR calc Af Amer 9 (L) >60 mL/min   Anion gap 23 (H) 5 - 15    Comment: Performed at [facility], [address] [place], [place] [address]  Troponin I (High Sensitivity)     Status: Abnormal   Collection Time: 12/28/18 10:00 AM  Result Value Ref Range   Troponin I (High Sensitivity) 49 (H) <18 ng/L    Comment: (NOTE) Elevated high sensitivity troponin I (hsTnI) values and significant  changes across serial measurements may suggest ACS but many other  chronic and acute conditions are known to elevate hsTnI results.  Refer to the Links section for chest pain algorithms and additional  guidance. Performed at [facility], [place] [address] [place], [place] [address]   APTT     Status: Abnormal   Collection Time: 12/28/18 10:00 AM  Result Value Ref Range   aPTT 50 (H) 24 - 36 seconds    Comment:        IF BASELINE aPTT IS ELEVATED, SUGGEST PATIENT RISK ASSESSMENT BE USED TO DETERMINE APPROPRIATE ANTICOAGULANT THERAPY. Performed at [facility], [place] [address], [place], [place] [address]   Lactic acid, plasma     Status: Abnormal   Collection Time: 12/28/18 10:02 AM  Result Value Ref  Range   Lactic Acid, Venous 6.9 (HH) 0.5 - 1.9 mmol/L    Comment: CRITICAL RESULT CALLED TO, READ BACK BY AND VERIFIED WITH: Edrei Norgaard,S RN @1134  ON [id number] BY FLEMINGS Performed at [place] [address], [place], [place] [address]   SARS Coronavirus 2 (CEPHEID- Performed in [place] hospital lab), Hosp Order     Status: None   Collection Time: 12/28/18 10:14 AM   Specimen: Nasopharyngeal Swab  Result Value Ref Range   SARS Coronavirus 2 NEGATIVE NEGATIVE    Comment: (NOTE) If result is NEGATIVE SARS-CoV-2 target nucleic acids are NOT DETECTED. The SARS-CoV-2 RNA is generally detectable in upper and lower  respiratory specimens during the acute phase of infection. The lowest  concentration of SARS-CoV-2 viral copies this assay can detect is 250  copies / mL. A negative result does not preclude SARS-CoV-2 infection  and should not be used as the sole basis for treatment or other  patient management decisions.  A negative result may occur with  improper specimen collection / handling, submission of specimen other  than nasopharyngeal swab, presence of viral mutation(s) within the  areas targeted by this assay, and inadequate number of viral copies  (<250 copies / mL). A negative result must be combined with clinical  observations, patient history, and epidemiological information. If result is POSITIVE SARS-CoV-2 target nucleic acids are DETECTED. The SARS-CoV-2 RNA is generally detectable in upper and lower  respiratory  specimens dur ing the acute phase of infection.  Positive  results are indicative of active infection with SARS-CoV-2.  Clinical  correlation with patient history and other diagnostic information is  necessary to determine patient infection status.  Positive results do  not rule out bacterial infection or co-infection with other viruses. If result is PRESUMPTIVE POSTIVE SARS-CoV-2 nucleic acids MAY BE PRESENT.   A presumptive positive result was  obtained on the submitted specimen  and confirmed on repeat testing.  While 2019 novel coronavirus  (SARS-CoV-2) nucleic acids may be present in the submitted sample  additional confirmatory testing may be necessary for epidemiological  and / or clinical management purposes  to differentiate between  SARS-CoV-2 and other Sarbecovirus currently known to infect humans.  If clinically indicated additional testing with an alternate test  methodology [phone]) is advised. The SARS-CoV-2 RNA is generally  detectable in upper and lower respiratory sp ecimens during the acute  phase of infection. The expected result is Negative. Fact Sheet for Patients:  StrictlyIdeas.no Fact Sheet for Healthcare Providers: BankingDealers.co.za This test is not yet approved or cleared by the [place] FDA and has been authorized for detection and/or diagnosis of SARS-CoV-2 by FDA under an Emergency Use Authorization (EUA).  This EUA will remain in effect (meaning this test can be used) for the duration of the COVID-19 declaration under Section 564(b)(1) of the Act, 21 U.S.C. section 360bbb-3(b)(1), unless the authorization is terminated or revoked sooner. Performed at [facility], [place] [address], [place], [place] [address]   CBG monitoring, ED     Status: Abnormal   Collection Time: 12/28/18 10:35 AM  Result Value Ref Range   Glucose-Capillary 150 (H) 70 - 99 mg/dL   Comment 1 Notify RN    Comment 2 Document in Chart   Troponin I (High Sensitivity)     Status: Abnormal   Collection Time: 12/28/18 12:20 PM  Result Value Ref Range   Troponin I (High Sensitivity) 31 (H) <18 ng/L    Comment: (NOTE) Elevated high sensitivity troponin I (hsTnI) values and significant  changes across serial measurements may suggest ACS but many other  chronic and acute conditions are known to elevate hsTnI results.  Refer to the "Links" section for chest pain algorithms  and additional  guidance. Performed at [facility], [place] [address], [place], [place] [address]   CBG monitoring, ED     Status: Abnormal   Collection Time: 12/28/18  1:08 PM  Result Value Ref Range   Glucose-Capillary 120 (H) 70 - 99 mg/dL  C-reactive protein     Status: Abnormal   Collection Time: 12/28/18  2:21 PM  Result Value Ref Range   CRP 2.3 (H) <1.0 mg/dL    Comment: Performed at [facility], [place] [address], [place], [place] [address]  Lactate dehydrogenase     Status: Abnormal   Collection Time: 12/28/18  2:21 PM  Result Value Ref Range   LDH 957 (H) 98 - 192 U/L    Comment: Performed at [facility], [place] [address] [place], [place] [address]   Ct Abdomen Wo Contrast  Result Date: 12/28/2018 CLINICAL DATA:  Elevated liver function tests. Nausea vomiting. EXAM: CT ABDOMEN W ITHOUT CONTRAST TECHNIQUE: Multidetector CT imaging of the abdomen was performed following the standard protocol without IV contrast. COMPARISON:  August 30, 2015 FINDINGS: Lower chest: Partially visualized area of scarring/atelectasis in the lingula. Atelectasis versus airspace consolidation in the dependent portion  of the left lower lobe. Enlarged heart. Minimal pericardial thickening versus tiny effusion. Calcific atherosclerotic disease of the aorta and coronary arteries, mild. Hepatobiliary: Normal appearance of the liver. The gallbladder is not abnormally distended but demonstrates a hyperdense appearance. Pancreas: Fatty replaced pancreas. Spleen: Normal in size without focal abnormality. Adrenals/Urinary Tract: Mild left adrenal thickening. Normal nonenhanced appearance of the kidneys. Stomach/Bowel: Stomach is within normal limits. No evidence of bowel wall thickening, distention, or inflammatory changes. Vascular/Lymphatic: Aortic atherosclerosis. No enlarged abdominal lymph nodes. Other: No abdominal wall hernia or abnormality. Musculoskeletal: Spondylosis the lumbosacral spine.  IMPRESSION: 1. Unusual hyperdense appearance of the gallbladder. If the patient has had recent administration of IV contrast, this may represent vicarious excretion of contrast. Otherwise MRCP or ERCP may be considered for further investigation. 2. Enlarged heart. Minimal pericardial thickening versus tiny effusion. 3. Partially visualized area of scarring/atelectasis in the lingula. 4. Atelectasis versus airspace consolidation in the dependent portion of the left lower lobe. 5. Calcific atherosclerotic disease of the aorta and coronary arteries. Electronically Signed   By: Fidela Salisbury M.D.   On: 12/28/2018 14:22   Dg Chest Port 1 View  Result Date: 12/28/2018 CLINICAL DATA:  Generalized weakness for 3 days. EXAM: PORTABLE CHEST 1 VIEW COMPARISON:  10/15/2018 FINDINGS: The heart is enlarged but stable. There is tortuosity and calcification of the thoracic aorta. The lungs are grossly clear. No infiltrates, edema or effusions. The bony thorax is intact. IMPRESSION: Stable cardiac enlargement but no acute pulmonary findings. Electronically Signed   By: Marijo Sanes M.D.   On: 12/28/2018 10:40    Pending Labs Unresulted Labs (From admission, onward)    Start     Ordered   12/29/18 0500  Procalcitonin  Daily,   R     12/28/18 1334   12/28/18 1425  CBC with Differential/Platelet  Once,   R     12/28/18 1425   12/28/18 1344  Urinalysis, Routine w reflex microscopic  Once,   STAT     12/28/18 1344   12/28/18 1330  Sedimentation rate  Once,   STAT     12/28/18 1334   12/28/18 1329  CBC with Differential/Platelet  Once,   STAT     12/28/18 1334   12/28/18 1328  Ferritin  Once,   STAT     12/28/18 1334   12/28/18 1327  Procalcitonin - Baseline  ONCE - STAT,   STAT     12/28/18 1334   12/28/18 1232  Urine culture  ONCE - STAT,   STAT     12/28/18 1233   12/28/18 1001  Blood culture (routine x 2)  BLOOD CULTURE X 2,   STAT    Question:  Patient immune status  Answer:  Normal   12/28/18 1001    Signed and Held  Comprehensive metabolic panel  Tomorrow morning,   R     Signed and Held   Signed and Held  CBC  Tomorrow morning,   R     Signed and Held          Vitals/Pain Today's Vitals   12/28/18 1400 12/28/18 1415 12/28/18 1430 12/28/18 1445  BP: 104/60 102/69 93/76 (!) 85/56  Pulse: 91     Resp: (!) 21  (!) 23 (!) 21  Temp:      TempSrc:      SpO2:      Weight:      Height:      PainSc:  Isolation Precautions Airborne and Contact precautions  Medications Medications  dextrose 50 % solution (  Not Given 12/28/18 1010)  ceFEPIme (MAXIPIME) 1 g in sodium chloride 0.9 % 100 mL IVPB (has no administration in time range)  sodium chloride 0.9 % bolus 500 mL (0 mLs Intravenous Stopped 12/28/18 1127)  dextrose 50 % solution 50 mL ( Intravenous Given 12/28/18 0956)  furosemide (LASIX) injection 40 mg (40 mg Intravenous Given 12/28/18 1214)  sodium chloride 0.9 % bolus 500 mL (0 mLs Intravenous Stopped 12/28/18 1300)  ceFEPIme (MAXIPIME) 2 g in sodium chloride 0.9 % 100 mL IVPB (0 g Intravenous Stopped 12/28/18 1333)  metroNIDAZOLE (FLAGYL) IVPB 500 mg (0 mg Intravenous Stopped 12/28/18 1403)  vancomycin (VANCOCIN) IVPB 1000 mg/200 mL premix (0 mg Intravenous Stopped 12/28/18 1402)  sodium chloride 0.9 % bolus 1,000 mL (0 mLs Intravenous Stopped 12/28/18 1330)    And  sodium chloride 0.9 % bolus 1,000 mL (1,000 mLs Intravenous New Bag/Given 12/28/18 1301)    And  sodium chloride 0.9 % bolus 1,000 mL (0 mLs Intravenous Stopped 12/28/18 1331)    Mobility walks with device High fall risk   Focused Assessments Pulmonary Assessment Handoff:  Lung sounds: Bilateral Breath Sounds: Diminished L Breath Sounds: Diminished R Breath Sounds: Diminished O2 Device: Nasal Cannula O2 Flow Rate (L/min): 3 L/min      R Recommendations: See Admitting Provider Note  Report given to:   Additional Notes:

## 2018-12-29 ENCOUNTER — Inpatient Hospital Stay (HOSPITAL_COMMUNITY): Payer: Medicare Other

## 2018-12-29 ENCOUNTER — Encounter (HOSPITAL_COMMUNITY): Payer: Self-pay | Admitting: Cardiology

## 2018-12-29 ENCOUNTER — Encounter: Payer: Self-pay | Admitting: Family Medicine

## 2018-12-29 DIAGNOSIS — G9340 Encephalopathy, unspecified: Secondary | ICD-10-CM

## 2018-12-29 DIAGNOSIS — E872 Acidosis: Secondary | ICD-10-CM

## 2018-12-29 DIAGNOSIS — I361 Nonrheumatic tricuspid (valve) insufficiency: Secondary | ICD-10-CM

## 2018-12-29 DIAGNOSIS — I4819 Other persistent atrial fibrillation: Secondary | ICD-10-CM

## 2018-12-29 DIAGNOSIS — I9589 Other hypotension: Secondary | ICD-10-CM

## 2018-12-29 DIAGNOSIS — E861 Hypovolemia: Secondary | ICD-10-CM

## 2018-12-29 DIAGNOSIS — I340 Nonrheumatic mitral (valve) insufficiency: Secondary | ICD-10-CM

## 2018-12-29 DIAGNOSIS — N179 Acute kidney failure, unspecified: Principal | ICD-10-CM

## 2018-12-29 LAB — MAGNESIUM: Magnesium: 1.9 mg/dL (ref 1.7–2.4)

## 2018-12-29 LAB — BLOOD CULTURE ID PANEL (REFLEXED)

## 2018-12-29 LAB — CBC
HCT: 47.2 % — ABNORMAL HIGH (ref 36.0–46.0)
Hemoglobin: 14.9 g/dL (ref 12.0–15.0)
MCH: 30.7 pg (ref 26.0–34.0)
MCHC: 31.6 g/dL (ref 30.0–36.0)
MCV: 97.3 fL (ref 80.0–100.0)
Platelets: 162 10*3/uL (ref 150–400)
RBC: 4.85 MIL/uL (ref 3.87–5.11)
RDW: 15.8 % — ABNORMAL HIGH (ref 11.5–15.5)
WBC: 6.9 10*3/uL (ref 4.0–10.5)
nRBC: 0.3 % — ABNORMAL HIGH (ref 0.0–0.2)

## 2018-12-29 LAB — BASIC METABOLIC PANEL
Anion gap: 14 (ref 5–15)
Anion gap: 16 — ABNORMAL HIGH (ref 5–15)
Anion gap: 13 (ref 5–15)
BUN: 59 mg/dL — ABNORMAL HIGH (ref 8–23)
BUN: 59 mg/dL — ABNORMAL HIGH (ref 8–23)
BUN: 61 mg/dL — ABNORMAL HIGH (ref 8–23)
CO2: 11 mmol/L — ABNORMAL LOW (ref 22–32)
CO2: 14 mmol/L — ABNORMAL LOW (ref 22–32)
CO2: 15 mmol/L — ABNORMAL LOW (ref 22–32)
Calcium: 7.9 mg/dL — ABNORMAL LOW (ref 8.9–10.3)
Calcium: 8.3 mg/dL — ABNORMAL LOW (ref 8.9–10.3)
Calcium: 8.1 mg/dL — ABNORMAL LOW (ref 8.9–10.3)
Chloride: 110 mmol/L (ref 98–111)
Chloride: 107 mmol/L (ref 98–111)
Chloride: 108 mmol/L (ref 98–111)
Creatinine, Ser: 3.86 mg/dL — ABNORMAL HIGH (ref 0.44–1.00)
Creatinine, Ser: 3.98 mg/dL — ABNORMAL HIGH (ref 0.44–1.00)
Creatinine, Ser: 3.86 mg/dL — ABNORMAL HIGH (ref 0.44–1.00)
GFR calc Af Amer: 12 mL/min — ABNORMAL LOW (ref >60)
GFR calc Af Amer: 12 mL/min — ABNORMAL LOW (ref >60)
GFR calc Af Amer: 12 mL/min — ABNORMAL LOW (ref >60)
GFR calc non Af Amer: 11 mL/min — ABNORMAL LOW (ref >60)
GFR calc non Af Amer: 10 mL/min — ABNORMAL LOW (ref >60)
GFR calc non Af Amer: 11 mL/min — ABNORMAL LOW (ref >60)
Glucose, Bld: 88 mg/dL (ref 70–99)
Glucose, Bld: 79 mg/dL (ref 70–99)
Glucose, Bld: 140 mg/dL — ABNORMAL HIGH (ref 70–99)
Potassium: 6.4 mmol/L (ref 3.5–5.1)
Potassium: 5.2 mmol/L — ABNORMAL HIGH (ref 3.5–5.1)
Potassium: 5.1 mmol/L (ref 3.5–5.1)
Sodium: 135 mmol/L (ref 135–145)
Sodium: 137 mmol/L (ref 135–145)
Sodium: 136 mmol/L (ref 135–145)

## 2018-12-29 LAB — ECHOCARDIOGRAM COMPLETE
Height: 63 in
Weight: 3328 oz

## 2018-12-29 LAB — GLUCOSE, CAPILLARY
Glucose-Capillary: 104 mg/dL — ABNORMAL HIGH (ref 70–99)
Glucose-Capillary: 81 mg/dL (ref 70–99)
Glucose-Capillary: 110 mg/dL — ABNORMAL HIGH (ref 70–99)
Glucose-Capillary: 135 mg/dL — ABNORMAL HIGH (ref 70–99)
Glucose-Capillary: 114 mg/dL — ABNORMAL HIGH (ref 70–99)
Glucose-Capillary: 124 mg/dL — ABNORMAL HIGH (ref 70–99)

## 2018-12-29 LAB — LACTIC ACID, PLASMA
Lactic Acid, Venous: 2.7 mmol/L (ref 0.5–1.9)
Lactic Acid, Venous: 2.6 mmol/L (ref 0.5–1.9)

## 2018-12-29 LAB — SODIUM, URINE, RANDOM: Sodium, Ur: 12 mmol/L

## 2018-12-29 LAB — BLOOD GAS, ARTERIAL
Acid-base deficit: 13.7 mmol/L — ABNORMAL HIGH (ref 0.0–2.0)
Bicarbonate: 11.6 mmol/L — ABNORMAL LOW (ref 20.0–28.0)
Drawn by: 336831
O2 Content: 2 L/min
O2 Saturation: 98 %
Patient temperature: 98.6
pCO2 arterial: 24.9 mmHg — ABNORMAL LOW (ref 32.0–48.0)
pH, Arterial: 7.289 — ABNORMAL LOW (ref 7.350–7.450)
pO2, Arterial: 140 mmHg — ABNORMAL HIGH (ref 83.0–108.0)

## 2018-12-29 LAB — PROCALCITONIN: Procalcitonin: 0.62 ng/mL

## 2018-12-29 MED ORDER — VANCOMYCIN VARIABLE DOSE PER UNSTABLE RENAL FUNCTION (PHARMACIST DOSING): Status: DC

## 2018-12-29 MED ORDER — BOOST / RESOURCE BREEZE PO LIQD CUSTOM
1.0000 | Freq: Three times a day (TID) | ORAL | Status: DC
  Administered 2018-12-29 – 2019-01-08 (×22): 1 via ORAL
  Administered 2019-01-08: 10:00:00 via ORAL
  Administered 2019-01-08 – 2019-01-12 (×11): 1 via ORAL

## 2018-12-29 MED ORDER — ZOLPIDEM TARTRATE 5 MG PO TABS
5.0000 mg | ORAL_TABLET | Freq: Once | ORAL | Status: AC
  Administered 2018-12-29: 5 mg via ORAL
  Filled 2018-12-29: qty 1

## 2018-12-29 MED ORDER — PIPERACILLIN-TAZOBACTAM IN DEX 2-0.25 GM/50ML IV SOLN
2.2500 g | Freq: Three times a day (TID) | INTRAVENOUS | Status: DC
  Administered 2018-12-29 (×2): 2.25 g via INTRAVENOUS
  Filled 2018-12-29 (×6): qty 50

## 2018-12-29 MED ORDER — SODIUM BICARBONATE 8.4 % IV SOLN
INTRAVENOUS | Status: DC
  Administered 2018-12-29 – 2018-12-30 (×4): via INTRAVENOUS
  Filled 2018-12-29 (×5): qty 150

## 2018-12-29 MED ORDER — AMIODARONE HCL 200 MG PO TABS
200.0000 mg | ORAL_TABLET | Freq: Every day | ORAL | Status: DC
  Administered 2018-12-31 – 2019-01-10 (×11): 200 mg via ORAL
  Filled 2018-12-29 (×11): qty 1

## 2018-12-29 MED ORDER — SODIUM ZIRCONIUM CYCLOSILICATE 10 G PO PACK
10.0000 g | PACK | Freq: Once | ORAL | Status: AC
  Administered 2018-12-29: 10 g via ORAL
  Filled 2018-12-29: qty 1

## 2018-12-29 MED ORDER — CALCIUM GLUCONATE-NACL 1-0.675 GM/50ML-% IV SOLN
1.0000 g | Freq: Once | INTRAVENOUS | Status: AC
  Administered 2018-12-29: 1000 mg via INTRAVENOUS
  Filled 2018-12-29: qty 50

## 2018-12-29 NOTE — Progress Notes (Signed)
Pt given Ambien 5mg  po at 0053. Pt requested for me to sit in rm until she fell asleep. Pt remained needy while falling asleep ie: altering HOB level and getting pt comfortable. She also kept calling out to make sure I was still there. Pt asleep at 0120 and I left the rm. Bed alarm set

## 2018-12-29 NOTE — Progress Notes (Signed)
NCM received call from [facility] with [facility] stating patient is active with them for [facility], [place].

## 2018-12-29 NOTE — Progress Notes (Signed)
PHARMACY - PHYSICIAN COMMUNICATION CRITICAL VALUE ALERT - BLOOD CULTURE IDENTIFICATION (BCID)  Teresa [name] is an 78 y.o. female who presented to [facility] on 12/28/2018 with a chief complaint of decreased PO intake x2 weeks with progressive fatigue and increasing difficulty walking.  Assessment:  Admitted MD doubts infection, awaiting procalcitonin to confirm downtrend (currently still in progress), currently on broad-spectrum ABX until infection r/o.  Name of physician (or Provider) ContactedTy Hilts  Current antibiotics: vanc/cefepime  Changes to prescribed antibiotics recommended:  Will not change any ABX for now as infection still not ruled out; spoke w/ Dr Tyrell Antonio who agrees to continue ABX for now; recommended additional blood cx as MD would like to see all negative cx w/ unclear source of signs of sepsis.  Results for orders placed or performed during the hospital encounter of 12/28/18  Blood Culture ID Panel (Reflexed) (Collected: 12/28/2018 10:01 AM)  Result Value Ref Range   Enterococcus species NOT DETECTED NOT DETECTED   Listeria monocytogenes NOT DETECTED NOT DETECTED   Staphylococcus species DETECTED (A) NOT DETECTED   Staphylococcus aureus (BCID) NOT DETECTED NOT DETECTED   Methicillin resistance NOT DETECTED NOT DETECTED   Streptococcus species NOT DETECTED NOT DETECTED   Streptococcus agalactiae NOT DETECTED NOT DETECTED   Streptococcus pneumoniae NOT DETECTED NOT DETECTED   Streptococcus pyogenes NOT DETECTED NOT DETECTED   Acinetobacter baumannii NOT DETECTED NOT DETECTED   Enterobacteriaceae species NOT DETECTED NOT DETECTED   Enterobacter cloacae complex NOT DETECTED NOT DETECTED   Escherichia coli NOT DETECTED NOT DETECTED   Klebsiella oxytoca NOT DETECTED NOT DETECTED   Klebsiella pneumoniae NOT DETECTED NOT DETECTED   Proteus species NOT DETECTED NOT DETECTED   Serratia marcescens NOT DETECTED NOT DETECTED   Haemophilus influenzae NOT DETECTED NOT  DETECTED   Neisseria meningitidis NOT DETECTED NOT DETECTED   Pseudomonas aeruginosa NOT DETECTED NOT DETECTED   Candida albicans NOT DETECTED NOT DETECTED   Candida glabrata NOT DETECTED NOT DETECTED   Candida krusei NOT DETECTED NOT DETECTED   Candida parapsilosis NOT DETECTED NOT DETECTED   Candida tropicalis NOT DETECTED NOT DETECTED    Wynona Neat, PharmD, BCPS  12/29/2018  7:53 AM

## 2018-12-29 NOTE — Progress Notes (Signed)
Msg sent to Dr.Schorr. Pt calling out every 15 min for repositioning among other things. Wants something to help her sleep.

## 2018-12-29 NOTE — Progress Notes (Addendum)
Pt slept (napped) approx 30-45 min with the Ambien given at 0053.  Pt has been calling out every 10 min approx for position changes and other things.

## 2018-12-29 NOTE — Progress Notes (Signed)
MD notified of lactate 2.6.

## 2018-12-29 NOTE — Progress Notes (Addendum)
Pt in a deep sleep glucose check done while pt was sleeping.

## 2018-12-29 NOTE — Progress Notes (Signed)
Bladder scan 24 ml.  Pt urinated 100 ml. Urine sample sent.

## 2018-12-29 NOTE — Progress Notes (Signed)
*  PRELIMINARY RESULTS* Echocardiogram 2D Echocardiogram has been performed.  Teresa [name] 12/29/2018, 1:37 PM

## 2018-12-29 NOTE — Progress Notes (Signed)
Palliative Medicine RN Note: Consult order noted. PMT will see when we have an available provider.  I spoke with [facility] liaison. Patient was admitted to home hospice in May but revoked on 11/09/2018. Her son reported that at an office visit her PCP told them that she was not appropriate for hospice; at that time, pt revoked & started PT.  Marjie Skiff Joyclyn Plazola, RN, BSN, [facility] Palliative Medicine Team 12/29/2018 11:20 AM Office [phone]

## 2018-12-29 NOTE — Consult Note (Addendum)
NAME:  Teresa [name], MRN:  [id number], DOB:  Jun 30, 1940, LOS: 1 ADMISSION DATE:  12/28/2018, CONSULTATION DATE:  12/29/2018 REFERRING MD:  Tyrell Antonio - THN, CHIEF COMPLAINT:  Weakness, fatigue   Brief History   78 yo F admitted 7/14 to [facility]. Noted to be acidotic on ABG. PCCM consulted for evaluation.  Patient was started on Bicarb gtt preceding our evaluation.   History of present illness   History obtained from chart  60 year of F PMH HTN, systolic heart failure rEF, Afib, CAD, Depression, DM, Hypothyroidism who presented 7/14 after a two week history of poor PO intake, with associated weakness and fatigue, progressing to day of presentation when patient was too weak to walk with baseline walker. Patient's PO intake has decreased significantly since the passing of the patient's daughter July 4th weekend. Patient endorsed depression but denies SI. Patient has not has fever, chills, diarrhea, sick contacts. Patient has continued to receive her baseline cardiac and diabetic medications.   Initial labs reveal acute renal injury with associated hyperkalemia, transaminitis, lactate 6.9, WBC 9.3. h/H suggest that patient is dehydrated.    Past Medical History  HTN DM CAD Systolic heart failure reduced EF Afib Depression  Anxiety Hypothyroidism Arthritis Mitral regurgitation Tricuspid regurgitation PUD  Significant Hospital Events   7/14 admitted 7/15 PCCM consulted RE possible ICU transfer   Consults:  PCCM  Procedures:   Significant Diagnostic Tests:  7/15 RUQ [place] >>> 7/14 CT a/p > unusual hyperdense appearance of gallbladder. Cardiomegaly.  Minimal pericardial thickening.   Micro Data:  BCx 7/14> methicillin susceptible, coagulase negative staph.  MRSA 7/14> neg SARS CoV2 7/14> neg   Antimicrobials:  Cefepime 7/14>> Vanc 7/14 Flagyl 7/14   Interim history/subjective:  PCCM consulted by [facility] for evaluation with question about need for transfer to ICU.   Patient seated  in bed, awake, NAD   Objective   Blood pressure 92/77, pulse 91, temperature 97.8 F (36.6 C), temperature source Oral, resp. rate 17, height 5\' 3"  (1.6 m), weight 94.3 kg, SpO2 98 %.        Intake/Output Summary (Last 24 hours) at 12/29/2018 1020 Last data filed at 12/29/2018 0700 Gross per 24 hour  Intake 6086.47 ml  Output 200 ml  Net 5886.47 ml   Filed Weights   12/28/18 1222 12/28/18 1309  Weight: 88.5 kg 94.3 kg    Examination: General: Chronically ill appearing older adult F, seated in bed NAD  HENT: NCAT. Tacky mucus membranes. Trachea midline. Patent nares  Lungs: CTA bilaterally. No accessory muscle recruitment, symmetrical chest expansion  Cardiovascular: RRR s1s2. Capillary refill < 3 seconds BUE BLE  Abdomen: Round, soft, ndnt.  Extremities: Symmtrical bulk and tone, no obvious joint deformity, no cyanosis, no clubbing Neuro: Awake, not oriented, following commands. PERRL  GU: due to void  Skin: pale, clean, dry, warm, without rash  Resolved Hospital Problem list     Assessment & Plan:   AKI on CKD: likely in the setting of dehydration given her poor PO intake over the preceding days. Also consider RTA given she has a non-gap acidosis as well.  - Consider gentle volume replacement - Follow BMP  NAG acidosis: presumably in the setting of acute renal failure. Consider RTA. - Continue sodium bicarbonate infusion - Follow BMP  Chronic HFrEF Atrial fibrillation - Careful volume resuscitation. S/p 6L already.  - HF medications per primary.  - Hold diuretics.   Transaminates likely due to  - RUQ [place] pending  - Suspect  this will improve as we continue to hydrate  - Discontinuing tylenol.   ? Sepsis:  BC positive for staph in anaerobic bottle only. Suspect contaminant. UA with many bacteria, but no leukocytes or nitrites. Collection issue? No WBC, no fevers.  - Reasonable to continue ABX for now and repeat cultures.   DM - per primary - Hold metformin   Goals of care: Has been DNR in the past and was considered for hospice by IMTS. Unclear what events have lead her to now being full code. Recommend further discussion regarding goals of care. Palliative care consult placed.  Best practice:  Diet: heart health/carb modified  Pain/Anxiety/Delirium protocol (if indicated): tylenol  VAP protocol (if indicated): n/a  DVT prophylaxis: eliquis  GI prophylaxis: pepcid  Glucose control: SSI  Mobility:  Code Status: Full Code-- Of note, patient was recently a DNR. Per note on 10/20/2018: "  Ms. Yow was seen this morning on team rounds.  Dr. Sharon Seller coordinated with the patient, her son, cardiology, and outpatient hospice and all arrangements have been finalized for discharge today.  The focus will be on comfort and maintaining the patient in her home." 5/18 Primary Care note states "patient declines hospice at this time" and was subsequently referred to home health Family Communication: per primary  Disposition: continue current level of care   Labs   CBC: Recent Labs  Lab 12/28/18 1000 12/28/18 1642 12/29/18 0612  WBC 9.3 10.6* 6.9  NEUTROABS  --  7.6  --   HGB 16.1* 14.6 14.9  HCT 51.9* 46.5* 47.2*  MCV 97.4 98.1 97.3  PLT 213 179 235    Basic Metabolic Panel: Recent Labs  Lab 12/28/18 1000 12/29/18 0612 12/29/18 0811  NA 136  --  135  K 5.8*  --  6.4*  CL 97*  --  110  CO2 16*  --  11*  GLUCOSE 37*  --  88  BUN 62*  --  59*  CREATININE 4.82*  --  3.86*  CALCIUM 9.4  --  7.9*  MG  --  1.9  --    GFR: Estimated Creatinine Clearance: 13.1 mL/min (A) (by C-G formula based on SCr of 3.86 mg/dL (H)). Recent Labs  Lab 12/28/18 1000 12/28/18 1002 12/28/18 1421 12/28/18 1521 12/28/18 1642 12/29/18 0612 12/29/18 0811  PROCALCITON  --   --  0.57  --   --   --   --   WBC 9.3  --   --   --  10.6* 6.9  --   LATICACIDVEN  --  6.9*  --  4.9*  --   --  2.7*    Liver Function Tests: Recent Labs  Lab 12/28/18 1000  AST 405*   ALT 287*  ALKPHOS 269*  BILITOT 2.9*  PROT 6.6  ALBUMIN 3.9   No results for input(s): LIPASE, AMYLASE in the last 168 hours. No results for input(s): AMMONIA in the last 168 hours.  ABG    Component Value Date/Time   PHART 7.289 (L) 12/29/2018 0940   PCO2ART 24.9 (L) 12/29/2018 0940   PO2ART 140 (H) 12/29/2018 0940   HCO3 11.6 (L) 12/29/2018 0940   TCO2 15 (L) 08/10/2018 1859   ACIDBASEDEF 13.7 (H) 12/29/2018 0940   O2SAT 98.0 12/29/2018 0940     Coagulation Profile: No results for input(s): INR, PROTIME in the last 168 hours.  Cardiac Enzymes: No results for input(s): CKTOTAL, CKMB, CKMBINDEX, TROPONINI in the last 168 hours.  HbA1C: Hgb A1c MFr Bld  Date/Time Value Ref Range Status  06/07/2018 02:50 PM 5.4 4.8 - 5.6 % Final    Comment:             Prediabetes: 5.7 - 6.4          Diabetes: >6.4          Glycemic control for adults with diabetes: <7.0   09/09/2017 04:26 PM 5.5 4.8 - 5.6 % Final    Comment:             Prediabetes: 5.7 - 6.4          Diabetes: >6.4          Glycemic control for adults with diabetes: <7.0     CBG: Recent Labs  Lab 12/28/18 1625 12/28/18 2207 12/28/18 2328 12/29/18 0349 12/29/18 0932  GLUCAP 91 65* 78 104* 81    Review of Systems:   Unable to obtain, patient is not oriented.   Past Medical History  She,  has a past medical history of Abdominal pain, Anxiety, Arthritis, Atrial fibrillation ([place]) (98/3382), Chronic systolic CHF (congestive heart failure), NYHA class 2 ([place]), Diabetes mellitus type 2, diet-controlled ([place]), GERD (gastroesophageal reflux disease), Glaucoma, H/O hiatal hernia, History of glaucoma, History of small bowel obstruction (2009 AND NOV 2012), HTN (hypertension), Hypothyroidism, Left ovarian cyst, Moderate mitral regurgitation, Moderate tricuspid regurgitation, Non-healing surgical wound, Nonischemic cardiomyopathy ([place]) (DR HOCHREIN ), Peptic ulcer disease, PVC (premature ventricular contraction),  Shortness of breath, Spinal stenosis, Urge urinary incontinence, Valvular heart disease, and Wears dentures.   Surgical History    Past Surgical History:  Procedure Laterality Date  . ABDOMINAL HERNIA REPAIR  32 YRS AGO   PERITINITIS  . APPENDECTOMY  1979   RUPTURED  . BENIGN RIGHT BREAST TUMOR REMOVED    . CARDIOVASCULAR STRESS TEST  JULY 2011-  DR HOCHREIN   NO ISCHEMIA  . CARDIOVERSION N/A 08/17/2018   Procedure: CARDIOVERSION;  Surgeon: Sueanne Margarita, MD;  Location: [facility] ENDOSCOPY;  Service: Cardiovascular;  Laterality: N/A;  . EXPLORATOMY LAP. / EXTENSIVE LYSIS ADHESIONS/ SMALL BOWEL RESECTION X2 WITH PRIMARY ANASTOMOSIS X2  04-18-2011   SMALL BOWEL OBSTRUCTION  . GLAUCOMA SURGERY  1978  . HERNIA REPAIR    . INCISION AND DRAINAGE OF WOUND N/A 09/13/2012   Procedure: INCISION  AND DEBRIDEMENT WOUND abdominal wall ;  Surgeon: Rolm Bookbinder, MD;  Location: [place];  Service: General;  Laterality: N/A;  coordination with Dr Migdalia Dk   . KNEE ARTHROSCOPY W/ MENISCECTOMY  06-05-2004  . TEE WITHOUT CARDIOVERSION N/A 08/17/2018   Procedure: TRANSESOPHAGEAL ECHOCARDIOGRAM (TEE);  Surgeon: Sueanne Margarita, MD;  Location: [facility] ENDOSCOPY;  Service: Cardiovascular;  Laterality: N/A;  . THYROIDECTOMY  1978 GOITOR   AND CERVICAL COLD KNIFE CONE BX  . TONSILLECTOMY  AGE 60  . TRANSTHORACIC ECHOCARDIOGRAM  12-27-2010   EF 45-50%/ MODERATE MV REGURG. / LEFT ATRIUM MILDLY DILATED/ MILD TRICUSPID REGURG.  . TUBAL LIGATION    . VENTRAL HERNIA REPAIR  X4  LAST ONE 20 YRS AGO   ABDOMINAL --  EACH ONE IN DIFFERENT AREA  . VENTRAL HERNIA REPAIR  01/29/2012   Procedure: HERNIA REPAIR VENTRAL ADULT;  Surgeon: Theodoro Kos, DO;  Location: [place];  Service: Plastics;  Laterality: N/A;  . WOUND DEBRIDEMENT  09/25/2011   Procedure: DEBRIDEMENT CLOSURE/ABDOMINAL WOUND;  Surgeon: Theodoro Kos, DO;  Location: [place];  Service: Plastics;  Laterality: N/A;  excision of abdominal  wound with primary closure  Social History   reports that she quit smoking about 8 years ago. Her smoking use included cigarettes. She started smoking about 54 years ago. She has a 67.50 pack-year smoking history. She has never used smokeless tobacco. She reports that she does not drink alcohol or use drugs.   Family History   Her family history includes Diabetes in her brother and daughter; Emphysema in her sister; Heart disease in her sister; Stroke in her brother, mother, and sister; Tuberculosis in her father.   Allergies Allergies  Allergen Reactions  . Aspirin Other (See Comments)    HX Bleeding Ulcer   . Nsaids Other (See Comments)    Hx of bleeding ulcers  . Tolmetin Rash    Hx of bleeding ulcers     Home Medications  Prior to Admission medications   Medication Sig Start Date End Date Taking? Authorizing Provider  albuterol (PROAIR HFA) 108 (90 Base) MCG/ACT inhaler USE 2 PUFFS EVERY 6 HOURS AS NEEDED FOR SHORTNESS OF BREATH AND WHEEZING. 10/05/18  Yes Stallings, Zoe A, MD  amiodarone (PACERONE) 200 MG tablet Take 1 tablet (200 mg total) by mouth 2 (two) times daily. 10/20/18  Yes Seawell, Jaimie A, DO  apixaban (ELIQUIS) 5 MG TABS tablet Take 1 tablet (5 mg total) by mouth 2 (two) times daily. 10/20/18  Yes Seawell, Jaimie A, DO  busPIRone (BUSPAR) 5 MG tablet Take 1 tablet (5 mg total) by mouth 2 (two) times daily. 06/07/18  Yes [name], MD  citalopram (CELEXA) 20 MG tablet TAKE 1 TABLET EACH DAY. Patient taking differently: Take 20 mg by mouth daily.  06/28/18  Yes [name], MD  doxylamine, Sleep, (UNISOM) 25 MG tablet Take 25 mg by mouth at bedtime as needed for sleep.   Yes [provider]  famotidine (PEPCID) 20 MG tablet Take 1 tablet (20 mg total) by mouth at bedtime. 12/01/18  Yes [name], MD  HYDROcodone-acetaminophen (NORCO/VICODIN) 5-325 MG tablet Take 1 tablet by mouth 5 (five) times daily.    Yes [provider]   hydrOXYzine (ATARAX/VISTARIL) 10 MG tablet TAKE 1 TABLET TWICE DAILY AS NEEDED FOR ANXIETY. Patient taking differently: Take 10 mg by mouth 2 (two) times daily as needed for anxiety.  11/04/18  Yes [name], MD  levothyroxine (SYNTHROID) 100 MCG tablet TAKE 1 TABLET ONCE DAILY BEFORE BREAKFAST. 11/03/18  Yes [name], MD  metoprolol succinate (TOPROL-XL) 100 MG 24 hr tablet Take 1 tablet (100 mg total) by mouth daily. Take with or immediately following a meal. 10/21/18  Yes Seawell, Jaimie A, DO  mirtazapine (REMERON) 7.5 MG tablet Take 1 tablet (7.5 mg total) by mouth at bedtime. 12/03/18  Yes [name], MD  potassium chloride (MICRO-K) 10 MEQ CR capsule Take 1 capsule (10 mEq total) by mouth 2 (two) times daily. 11/26/18  Yes [name], MD  torsemide (DEMADEX) 20 MG tablet Take 2 tablets (40 mg total) by mouth daily. 10/20/18  Yes Seawell, Jaimie A, DO  Spacer/Aero-Holding Chambers (AEROCHAMBER MV) inhaler Use as instructed 05/01/16   Javier Glazier, MD   Critical care time: 35 minutes     Eliseo Gum MSN, AGACNP-BC [place] [phone] If no answer, [phone]  12/29/2018, 10:21 AM  Attending Note:  78 year old female with history of failure to thrive who presents with severe dehydration.  Patient was altered and PCCM consulted for airway protection concern and severe acidosis.  On exam, she is  protecting her airway but confused.  I reviewed CXR myself, no acute disease noted.  This is a compensated NAG metabolic acidosis with respiratory compensation.  No need for intubation.  Vital signs stable.  Continue Bicarb drip.  Recommend changing cefepime to zosyn to avoid mental status altering effect of cefepime.  Spoke with the son.  Patient has been failing to thrive since her daughter died a month ago.  He will speak to his sister to discuss code status.  Patient does not need to go the ICU.  Palliative care consulted.  If additional  help is needed to manage acidosis and electrolytes recommend calling renal.  No further PCCM recommendations.  PCCM will sign off.  The patient is critically ill with multiple organ systems failure and requires high complexity decision making for assessment and support, frequent evaluation and titration of therapies, application of advanced monitoring technologies and extensive interpretation of multiple databases.   Critical Care Time devoted to patient care services described in this note is  40  Minutes. This time reflects time of care of this signee Dr Jennet Maduro. This critical care time does not reflect procedure time, or teaching time or supervisory time of PA/NP/Med student/Med Resident etc but could involve care discussion time.  Rush Farmer, M.D. [facility] Pulmonary/Critical Care Medicine. Pager: [phone]. After hours pager: [phone].

## 2018-12-29 NOTE — Progress Notes (Signed)
Spoke with patient's son [name]. Answered any questions and support given.  Gave phone to patient and she also conversed with son.

## 2018-12-29 NOTE — Progress Notes (Signed)
Initial Nutrition Assessment  DOCUMENTATION CODES:   Obesity unspecified  INTERVENTION:   Provide Boost Breeze po TID, each supplement provides 250 kcal and 9 grams of protein  NUTRITION DIAGNOSIS:   Inadequate oral intake related to (altered mental status) as evidenced by per patient/family report.  GOAL:   Patient will meet greater than or equal to 90% of their needs  MONITOR:   PO intake, Supplement acceptance, Labs, Weight trends, I & O's  REASON FOR ASSESSMENT:   Consult Assessment of nutrition requirement/status  ASSESSMENT:   78 year old with past medical history significant for hypertension, diabetes, coronary artery disease, heart failure reduced ejection fraction, A. fib, depression who was brought in because worsening weakness.  **RD working remotely**  Patient with AMS. Per family report in H&P, pt has not been eating well for almost 2 weeks since her daughter passed away. Pt is on a diet but per documentation is mainly drinking liquids at this time. RD will order Boost Breeze supplements.   Per weight records, pt with no weight loss.  Medications: Remeron tablet daily Labs reviewed: Elevated K  NUTRITION - FOCUSED PHYSICAL EXAM:  Unable to perform -working remotely.  Diet Order:   Diet Order            Diet heart healthy/carb modified Room service appropriate? Yes; Fluid consistency: Thin  Diet effective now              EDUCATION NEEDS:   Not appropriate for education at this time  Skin:  Skin Assessment: Reviewed RN Assessment  Last BM:  7/11  Height:   Ht Readings from Last 1 Encounters:  12/28/18 5\' 3"  (1.6 m)    Weight:   Wt Readings from Last 1 Encounters:  12/28/18 94.3 kg    Ideal Body Weight:  52.3 kg  BMI:  Body mass index is 36.85 kg/m.  Estimated Nutritional Needs:   Kcal:  1550-1750  Protein:  65-75g  Fluid:  1.5L/day  Clayton Bibles, MS, RD, LDN [place] Dietitian Pager:  [phone] After Hours Pager: [phone]

## 2018-12-29 NOTE — Progress Notes (Signed)
MD notified of critical lactate at 2.7

## 2018-12-29 NOTE — Progress Notes (Signed)
Pharmacy Antibiotic Note  Teresa [name] is a 78 y.o. female admitted on 12/28/2018 with sepsis.  Pharmacy was initially consulted for Vancomycin and Cefepime dosing, however MD has requested change Cefepime to Zosyn today.  Patient admitted with AKI thought secondary to dehydration.  SCr 4.8 > 3.98 (baseline SCr 1.8-2).  Plan: Zosyn 2.25gm IV q8h. Pt received Vancomycin x 1 dose 7/14.  Plan for Vancomycin level 7/16 to access clearance and need for redosing given AKI. Follow-up cx data and clinical progress.   Height: 5\' 3"  (160 cm) Weight: 208 lb (94.3 kg) IBW/kg (Calculated) : 52.4  Temp (24hrs), Avg:97.7 F (36.5 C), Min:97.5 F (36.4 C), Max:97.8 F (36.6 C)  Recent Labs  Lab 12/28/18 1000 12/28/18 1002 12/28/18 1521 12/28/18 1642 12/29/18 0612 12/29/18 0811 12/29/18 1012  WBC 9.3  --   --  10.6* 6.9  --   --   CREATININE 4.82*  --   --   --   --  3.86* 3.98*  LATICACIDVEN  --  6.9* 4.9*  --   --  2.7* 2.6*    Estimated Creatinine Clearance: 12.7 mL/min (A) (by C-G formula based on SCr of 3.98 mg/dL (H)).    Allergies  Allergen Reactions  . Aspirin Other (See Comments)    HX Bleeding Ulcer   . Nsaids Other (See Comments)    Hx of bleeding ulcers  . Tolmetin Rash    Hx of bleeding ulcers    Antimicrobials this admission: Vanc 7/14 >> Cefepime 7/14 >> 7/15 Zosyn 7/15 >>  Flagyl 7/14 x 1   Dose adjustments this admission:   Microbiology results: 7/15 blood x 2 7/14 covid neg 7/14 blood x 2 1/4 CNS  7/14 MRSA PCR negative 7/14 urine >> 10k GNR  Thank you for allowing pharmacy to be a part of this patient's care.  [employer], Pharm.D., BCPS Clinical Pharmacist Clinical phone for 12/29/2018 from 8:30-4:00 is [phone].  **Pharmacist phone directory can now be found on [email or web address] ([email or web address]).  Listed under [place].  12/29/2018 1:15 PM

## 2018-12-29 NOTE — Progress Notes (Signed)
New order for Ambien 5mg  received

## 2018-12-29 NOTE — Progress Notes (Signed)
Talked with son for 30 min about mom's condition. Son tearful at times. Afraid mom is giving up and is going to pass away. Explained I was expecting the Ambien to wear off during the night and she would be back to her baseline like last night. Son stated if she wakes up more and is talking more no matter the time to call him so he can talk to her. Informed son that earlier today when she was talking she may have swallowed water and it got in her lungs but the lungs aren't clear but they're not fluid sounding. Informed son mom is NPO until she wakes up completely. Mom started talking about her son [name] and knew who he was where about 45 min earlier she didn't. Gave my phone to her and she said a few words then went back to sleep. Informed son he could call anytime and check on her condition

## 2018-12-29 NOTE — Progress Notes (Signed)
Blood Cultures ordered and completed.

## 2018-12-29 NOTE — Progress Notes (Signed)
PROGRESS NOTE    Teresa [name]  QIW:[id number] DOB: [birth year] DOA: 12/28/2018 PCP: [name], MD   Brief Narrative: 78 year old with past medical history significant for hypertension, diabetes, coronary artery disease, heart failure reduced ejection fraction, A. fib, depression who was brought in because worsening weakness.  History obtained from the son: Per son patient has not been the same since July 4 weekend when patient's daughter died from congestive heart failure.  Patient has not been drinking or eating well. Patient presented with hypoglycemia, hypotensive, acute on chronic renal failure, lactic acidosis with a lactic level at 6.7.  The morning of July 15 patient noted to be more lethargic, worsening acidosis with a bicarb at 11, pH of 7.2.  CCM consulted for further evaluation.    Assessment & Plan:   Principal Problem:   Hypotension due to hypovolemia Active Problems:   Nonischemic cardiomyopathy (HCC)   Malaise and fatigue   Essential (primary) hypertension   Chronic systolic CHF (congestive heart failure), NYHA class 2 (HCC)   Moderate mitral regurgitation   Diabetes mellitus type 2, diet-controlled (HCC)   Hypothyroidism   Anxiety and depression   Acute renal failure (ARF) (HCC)   Atrial fibrillation (HCC)   Transaminitis  1-Hypotension; Concern for sepsis, versus cardiogenic shock.  Patient presented with lactic acidosis: Lactic acid at 6. Blood pressure has improved with IV fluids. 1 out of 4 blood cultures growing staph coagulase-negative.  We will continue with antibiotics for now. Echocardiogram ordered.  Cardiology consulted for further evaluation of cardiogenic component.  2-Metabolic acidosis: This could be related to renal failure, versus sepsis. Started IV bicarb drip. Repeat labs in the morning. Continue with fluids and IV antibiotics.  3-Acute on chronic renal failure: stage III, prior cr 1.8 Suspect multifactorial to hypovolemia,  hypotension. Continue with IV fluids. Monitor urine output. Avoid hypotension.  4-Acute metabolic encephalopathy: Suspect multifactorial related to acidosis, hypotension. ABG with a pH of 7.2, bicarb at 11. CCM consulted for evaluation for ICU admission  5-1 out of 4 blood cultures positive for staph coagulase-negative: Due to presentation, lactic acidosis, hypotension.  We will continue with IV antibiotics for now. We will repeat blood cultures.  6-Hyperkalemia: Lokelma ordered. Started bicarb drip. Received calcium gluconate.  7-A. fib: Continue with amiodarone. Continue with Eliquis.  8-Transaminases: Could be related to shock liver, hypotension. We will check right upper quadrant ultrasound.  9-Hypomagnesemia: Replete IV.  10-HFrEF; Due to hypotension and lactic acidosis cardiology has been consulted for further evaluation of cardiogenic component.  11-Hypothyroidism: Continue with Synthroid.  12-Hypoglycemia: Due to poor oral intake continue with IV fluids   Estimated body mass index is 36.85 kg/m as calculated from the following:   Height as of this encounter: 5\' 3"  (1.6 m).   Weight as of this encounter: 94.3 kg.   DVT prophylaxis: Eliquis Code Status: Full code  family Communication: Disposition Plan: Remain in the hospital for treatment of hypotension, electrolyte abnormalities, renal failure. Consultants:   CCM  Cardiology  Procedures:   Ultrasound pending  Antimicrobials:  Vancomycin 7/14 Zosyn 7/15   Subjective: Patient is sleepy, open eyes answer is a few question.  She relate last night was really bad.  Objective: Vitals:   12/29/18 0400 12/29/18 0405 12/29/18 0410 12/29/18 0752  BP:    92/77  Pulse: 86 77  91  Resp: 16 15 19 17   Temp:      TempSrc:      SpO2: 97% 98%  98%  Weight:  Height:        Intake/Output Summary (Last 24 hours) at 12/29/2018 0822 Last data filed at 12/29/2018 0700 Gross per 24 hour  Intake 6086.47  ml  Output 200 ml  Net 5886.47 ml   Filed Weights   12/28/18 1222 12/28/18 1309  Weight: 88.5 kg 94.3 kg    Examination:  General exam: Appears calm and comfortable , lethargic Respiratory system: Clear to auscultation. Respiratory effort normal. Cardiovascular system: S1 & S2 heard, RRR. No JVD, murmurs, rubs, gallops or clicks. No pedal edema. Gastrointestinal system: Abdomen is nondistended, soft and nontender. No organomegaly or masses felt. Normal bowel sounds heard. Central nervous system: Lethargic, follows some commands Extremities: Symmetric 5 x 5 power. Skin: No rashes, lesions or ulcers    Data Reviewed: I have personally reviewed following labs and imaging studies  CBC: Recent Labs  Lab 12/28/18 1000 12/28/18 1642 12/29/18 0612  WBC 9.3 10.6* 6.9  NEUTROABS  --  7.6  --   HGB 16.1* 14.6 14.9  HCT 51.9* 46.5* 47.2*  MCV 97.4 98.1 97.3  PLT 213 179 211   Basic Metabolic Panel: Recent Labs  Lab 12/28/18 1000 12/29/18 0612  NA 136  --   K 5.8*  --   CL 97*  --   CO2 16*  --   GLUCOSE 37*  --   BUN 62*  --   CREATININE 4.82*  --   CALCIUM 9.4  --   MG  --  1.9   GFR: Estimated Creatinine Clearance: 10.5 mL/min (A) (by C-G formula based on SCr of 4.82 mg/dL (H)). Liver Function Tests: Recent Labs  Lab 12/28/18 1000  AST 405*  ALT 287*  ALKPHOS 269*  BILITOT 2.9*  PROT 6.6  ALBUMIN 3.9   No results for input(s): LIPASE, AMYLASE in the last 168 hours. No results for input(s): AMMONIA in the last 168 hours. Coagulation Profile: No results for input(s): INR, PROTIME in the last 168 hours. Cardiac Enzymes: No results for input(s): CKTOTAL, CKMB, CKMBINDEX, TROPONINI in the last 168 hours. BNP (last 3 results) No results for input(s): PROBNP in the last 8760 hours. HbA1C: No results for input(s): HGBA1C in the last 72 hours. CBG: Recent Labs  Lab 12/28/18 1522 12/28/18 1625 12/28/18 2207 12/28/18 2328 12/29/18 0349  GLUCAP 88 91 65* 78  104*   Lipid Profile: No results for input(s): CHOL, HDL, LDLCALC, TRIG, CHOLHDL, LDLDIRECT in the last 72 hours. Thyroid Function Tests: No results for input(s): TSH, T4TOTAL, FREET4, T3FREE, THYROIDAB in the last 72 hours. Anemia Panel: Recent Labs    12/28/18 1421  FERRITIN 77   Sepsis Labs: Recent Labs  Lab 12/28/18 1002 12/28/18 1421 12/28/18 1521  PROCALCITON  --  0.57  --   LATICACIDVEN 6.9*  --  4.9*    Recent Results (from the past 240 hour(s))  Blood culture (routine x 2)     Status: None (Preliminary result)   Collection Time: 12/28/18 10:01 AM   Specimen: BLOOD  Result Value Ref Range Status   Specimen Description BLOOD LEFT ANTECUBITAL  Final   Special Requests   Final    BOTTLES DRAWN AEROBIC AND ANAEROBIC Blood Culture adequate volume   Culture  Setup Time   Final    GRAM POSITIVE COCCI ANAEROBIC BOTTLE ONLY Organism ID to follow CRITICAL RESULT CALLED TO, READ BACK BY AND VERIFIED WITH: Zoila Shutter [id number] MLM Performed at [facility], [place] [address] [place], [place] [address]  Culture GRAM POSITIVE COCCI  Final   Report Status PENDING  Incomplete  Blood Culture ID Panel (Reflexed)     Status: Abnormal   Collection Time: 12/28/18 10:01 AM  Result Value Ref Range Status   Enterococcus species NOT DETECTED NOT DETECTED Final   Listeria monocytogenes NOT DETECTED NOT DETECTED Final   Staphylococcus species DETECTED (A) NOT DETECTED Final    Comment: Methicillin (oxacillin) susceptible coagulase negative staphylococcus. Possible blood culture contaminant (unless isolated from more than one blood culture draw or clinical case suggests pathogenicity). No antibiotic treatment is indicated for blood  culture contaminants. CRITICAL RESULT CALLED TO, READ BACK BY AND VERIFIED WITH: PHARMD V BRYK [id number] MLM    Staphylococcus aureus (BCID) NOT DETECTED NOT DETECTED Final   Methicillin resistance NOT DETECTED NOT DETECTED Final    Streptococcus species NOT DETECTED NOT DETECTED Final   Streptococcus agalactiae NOT DETECTED NOT DETECTED Final   Streptococcus pneumoniae NOT DETECTED NOT DETECTED Final   Streptococcus pyogenes NOT DETECTED NOT DETECTED Final   Acinetobacter baumannii NOT DETECTED NOT DETECTED Final   Enterobacteriaceae species NOT DETECTED NOT DETECTED Final   Enterobacter cloacae complex NOT DETECTED NOT DETECTED Final   Escherichia coli NOT DETECTED NOT DETECTED Final   Klebsiella oxytoca NOT DETECTED NOT DETECTED Final   Klebsiella pneumoniae NOT DETECTED NOT DETECTED Final   Proteus species NOT DETECTED NOT DETECTED Final   Serratia marcescens NOT DETECTED NOT DETECTED Final   Haemophilus influenzae NOT DETECTED NOT DETECTED Final   Neisseria meningitidis NOT DETECTED NOT DETECTED Final   Pseudomonas aeruginosa NOT DETECTED NOT DETECTED Final   Candida albicans NOT DETECTED NOT DETECTED Final   Candida glabrata NOT DETECTED NOT DETECTED Final   Candida krusei NOT DETECTED NOT DETECTED Final   Candida parapsilosis NOT DETECTED NOT DETECTED Final   Candida tropicalis NOT DETECTED NOT DETECTED Final    Comment: Performed at [facility], [address], [place], [place] [address]  SARS Coronavirus 2 (CEPHEID- Performed in [facility] hospital lab), Hosp Order     Status: None   Collection Time: 12/28/18 10:14 AM   Specimen: Nasopharyngeal Swab  Result Value Ref Range Status   SARS Coronavirus 2 NEGATIVE NEGATIVE Final    Comment: (NOTE) If result is NEGATIVE SARS-CoV-2 target nucleic acids are NOT DETECTED. The SARS-CoV-2 RNA is generally detectable in upper and lower  respiratory specimens during the acute phase of infection. The lowest  concentration of SARS-CoV-2 viral copies this assay can detect is 250  copies / mL. A negative result does not preclude SARS-CoV-2 infection  and should not be used as the sole basis for treatment or other  patient management decisions.  A negative  result may occur with  improper specimen collection / handling, submission of specimen other  than nasopharyngeal swab, presence of viral mutation(s) within the  areas targeted by this assay, and inadequate number of viral copies  (<250 copies / mL). A negative result must be combined with clinical  observations, patient history, and epidemiological information. If result is POSITIVE SARS-CoV-2 target nucleic acids are DETECTED. The SARS-CoV-2 RNA is generally detectable in upper and lower  respiratory specimens dur ing the acute phase of infection.  Positive  results are indicative of active infection with SARS-CoV-2.  Clinical  correlation with patient history and other diagnostic information is  necessary to determine patient infection status.  Positive results do  not rule out bacterial infection or co-infection with other viruses. If result is  PRESUMPTIVE POSTIVE SARS-CoV-2 nucleic acids MAY BE PRESENT.   A presumptive positive result was obtained on the submitted specimen  and confirmed on repeat testing.  While 2019 novel coronavirus  (SARS-CoV-2) nucleic acids may be present in the submitted sample  additional confirmatory testing may be necessary for epidemiological  and / or clinical management purposes  to differentiate between  SARS-CoV-2 and other Sarbecovirus currently known to infect humans.  If clinically indicated additional testing with an alternate test  methodology [phone]) is advised. The SARS-CoV-2 RNA is generally  detectable in upper and lower respiratory sp ecimens during the acute  phase of infection. The expected result is Negative. Fact Sheet for Patients:  StrictlyIdeas.no Fact Sheet for Healthcare Providers: BankingDealers.co.za This test is not yet approved or cleared by the [place] FDA and has been authorized for detection and/or diagnosis of SARS-CoV-2 by FDA under an Emergency Use Authorization  (EUA).  This EUA will remain in effect (meaning this test can be used) for the duration of the COVID-19 declaration under Section 564(b)(1) of the Act, 21 U.S.C. section 360bbb-3(b)(1), unless the authorization is terminated or revoked sooner. Performed at [facility], [place] [address] [place], [place] [address]   MRSA PCR Screening     Status: None   Collection Time: 12/28/18  5:15 PM   Specimen: Nasopharyngeal  Result Value Ref Range Status   MRSA by PCR NEGATIVE NEGATIVE Final    Comment:        The GeneXpert MRSA Assay (FDA approved for NASAL specimens only), is one component of a comprehensive MRSA colonization surveillance program. It is not intended to diagnose MRSA infection nor to guide or monitor treatment for MRSA infections. Performed at [facility], [place] [address], [place], [place] [address]          Radiology Studies: Ct Abdomen Wo Contrast  Result Date: 12/28/2018 CLINICAL DATA:  Elevated liver function tests. Nausea vomiting. EXAM: CT ABDOMEN W ITHOUT CONTRAST TECHNIQUE: Multidetector CT imaging of the abdomen was performed following the standard protocol without IV contrast. COMPARISON:  August 30, 2015 FINDINGS: Lower chest: Partially visualized area of scarring/atelectasis in the lingula. Atelectasis versus airspace consolidation in the dependent portion of the left lower lobe. Enlarged heart. Minimal pericardial thickening versus tiny effusion. Calcific atherosclerotic disease of the aorta and coronary arteries, mild. Hepatobiliary: Normal appearance of the liver. The gallbladder is not abnormally distended but demonstrates a hyperdense appearance. Pancreas: Fatty replaced pancreas. Spleen: Normal in size without focal abnormality. Adrenals/Urinary Tract: Mild left adrenal thickening. Normal nonenhanced appearance of the kidneys. Stomach/Bowel: Stomach is within normal limits. No evidence of bowel wall thickening, distention, or inflammatory changes.  Vascular/Lymphatic: Aortic atherosclerosis. No enlarged abdominal lymph nodes. Other: No abdominal wall hernia or abnormality. Musculoskeletal: Spondylosis the lumbosacral spine. IMPRESSION: 1. Unusual hyperdense appearance of the gallbladder. If the patient has had recent administration of IV contrast, this may represent vicarious excretion of contrast. Otherwise MRCP or ERCP may be considered for further investigation. 2. Enlarged heart. Minimal pericardial thickening versus tiny effusion. 3. Partially visualized area of scarring/atelectasis in the lingula. 4. Atelectasis versus airspace consolidation in the dependent portion of the left lower lobe. 5. Calcific atherosclerotic disease of the aorta and coronary arteries. Electronically Signed   By: Fidela Salisbury M.D.   On: 12/28/2018 14:22   Dg Chest Port 1 View  Result Date: 12/28/2018 CLINICAL DATA:  Generalized weakness for 3 days. EXAM: PORTABLE CHEST 1 VIEW COMPARISON:  10/15/2018 FINDINGS: The  heart is enlarged but stable. There is tortuosity and calcification of the thoracic aorta. The lungs are grossly clear. No infiltrates, edema or effusions. The bony thorax is intact. IMPRESSION: Stable cardiac enlargement but no acute pulmonary findings. Electronically Signed   By: Marijo Sanes M.D.   On: 12/28/2018 10:40        Scheduled Meds:  amiodarone  200 mg Oral BID   apixaban  5 mg Oral BID   busPIRone  5 mg Oral BID   citalopram  20 mg Oral Daily   famotidine  20 mg Oral QHS   insulin aspart  0-9 Units Subcutaneous Q4H   levothyroxine  100 mcg Oral Q0600   mirtazapine  7.5 mg Oral QHS   Continuous Infusions:  sodium chloride 150 mL/hr at 12/29/18 0355   ceFEPime (MAXIPIME) IV       LOS: 1 day    Time spent: 35 minutes.    Elmarie Shiley, MD [facility] Pager [phone]  If 7PM-7AM, please contact night-coverage [email or web address] Password [email or web address] 12/29/2018, 8:22 AM

## 2018-12-29 NOTE — Progress Notes (Signed)
RN found patient had one time dose Ambien last night under discontinued meds- did not see previously in [facility].  MD notified. Orders given to not give anymore Ambien and to continue to monitor. Will pass along to night shift.

## 2018-12-29 NOTE — Consult Note (Signed)
Cardiology Consultation:   Patient ID: Teresa [name] MRN: [id number]; DOB: [birth year]  Admit date: 12/28/2018 Date of Consult: 12/29/2018  Primary Care Provider: [name], MD Primary Cardiologist: Minus Breeding, MD     Patient Profile:   Teresa [name] is a 78 y.o. female with a hx of paroxysmal atrial fibrillation, chronic systolic congestive heart failure, diabetes mellitus, hypothyroidism, anxiety who is being seen today for the evaluation of atrial fibrillation and hypotension at the request of Niel Hummer MD.  History of Present Illness:   Most recent echocardiogram February 2020 showed ejection fraction 35 to 40%, biatrial enlargement, mild aortic insufficiency.  Transesophageal echocardiogram March 2020 showed ejection fraction 40 to 45%, moderate left atrial enlargement, moderate to severe mitral regurgitation and mild to moderate tricuspid regurgitation.  Had cardioversion at that time but atrial fibrillation recurred. Pt was started on amiodarone with plans ultimately to proceed with cardioversion.  Patient apparently has had gradual decline since July 4 weekend when her daughter died from congestive heart failure.  She was admitted on July 14 with generalized weakness and fatigue.  She was found to be in acute renal failure, hypotensive, possible sepsis and with lactic acidosis all felt secondary to dehydration.  Cardiology asked to evaluate for hypotension and atrial fibrillation.  Patient presently denies dyspnea, chest pain, palpitations, bleeding or syncope.  She complains of generalized fatigue and weakness.  Heart Pathway Score:     Past Medical History:  Diagnosis Date  . Abdominal pain   . Anxiety    maintained on Xanax tid for years.  . Arthritis    FEET, KNEES, HANDS  . Atrial fibrillation ([place]) 09/2018  . Chronic systolic CHF (congestive heart failure), NYHA class 2 ([place])    EF 25% 2011 MV,  50-55% echo 6/13  . Diabetes mellitus type 2, diet-controlled  ([place])   . GERD (gastroesophageal reflux disease)   . Glaucoma    s/p surgery B in 30s.  [place].  . H/O hiatal hernia   . History of glaucoma   . History of small bowel obstruction 2009 AND NOV 2012  . HTN (hypertension)   . Hypothyroidism   . Left ovarian cyst   . Moderate mitral regurgitation   . Moderate tricuspid regurgitation   . Non-healing surgical wound    ABDOMINAL S/P EXPLORATOY LAPAROTOMY 04-18-2011  . Nonischemic cardiomyopathy ([place]) [facility]    EF is 25% per echo February 2012, non ischemic myoview 12/2009.  EF 50% echo 7/12 and plans for ICD cancelled.   . Peptic ulcer disease   . PVC (premature ventricular contraction)   . Shortness of breath   . Spinal stenosis   . Urge urinary incontinence   . Valvular heart disease    Moderate to severe MR, moderate TR, LAE per echo 7/11  . Wears dentures    upper denture-lower partial    Past Surgical History:  Procedure Laterality Date  . ABDOMINAL HERNIA REPAIR  32 YRS AGO   PERITINITIS  . APPENDECTOMY  1979   RUPTURED  . BENIGN RIGHT BREAST TUMOR REMOVED    . CARDIOVASCULAR STRESS TEST  JULY 2011-  DR HOCHREIN   NO ISCHEMIA  . CARDIOVERSION N/A 08/17/2018   Procedure: CARDIOVERSION;  Surgeon: Sueanne Margarita, MD;  Location[facility] ENDOSCOPY;  Service: Cardiovascular;  Laterality: N/A;  . EXPLORATOMY LAP. / EXTENSIVE LYSIS ADHESIONS/ SMALL BOWEL RESECTION X2 WITH PRIMARY ANASTOMOSIS X2  04-18-2011   SMALL BOWEL OBSTRUCTION  . GLAUCOMA SURGERY  1978  .  HERNIA REPAIR    . INCISION AND DRAINAGE OF WOUND N/A 09/13/2012   Procedure: INCISION  AND DEBRIDEMENT WOUND abdominal wall ;  Surgeon: Rolm Bookbinder, MD;  Location: [place];  Service: General;  Laterality: N/A;  coordination with Dr Migdalia Dk   . KNEE ARTHROSCOPY W/ MENISCECTOMY  06-05-2004  . TEE WITHOUT CARDIOVERSION N/A 08/17/2018   Procedure: TRANSESOPHAGEAL ECHOCARDIOGRAM (TEE);  Surgeon: Sueanne Margarita, MD;  Location: [facility] ENDOSCOPY;  Service: Cardiovascular;  Laterality:  N/A;  . THYROIDECTOMY  1978 GOITOR   AND CERVICAL COLD KNIFE CONE BX  . TONSILLECTOMY  AGE 64  . TRANSTHORACIC ECHOCARDIOGRAM  12-27-2010   EF 45-50%/ MODERATE MV REGURG. / LEFT ATRIUM MILDLY DILATED/ MILD TRICUSPID REGURG.  . TUBAL LIGATION    . VENTRAL HERNIA REPAIR  X4  LAST ONE 20 YRS AGO   ABDOMINAL --  EACH ONE IN DIFFERENT AREA  . VENTRAL HERNIA REPAIR  01/29/2012   Procedure: HERNIA REPAIR VENTRAL ADULT;  Surgeon: Theodoro Kos, DO;  Location: [place];  Service: Plastics;  Laterality: N/A;  . WOUND DEBRIDEMENT  09/25/2011   Procedure: DEBRIDEMENT CLOSURE/ABDOMINAL WOUND;  Surgeon: Theodoro Kos, DO;  Location: [place];  Service: Plastics;  Laterality: N/A;  excision of abdominal wound with primary closure       Inpatient Medications: Scheduled Meds: . amiodarone  200 mg Oral BID  . apixaban  5 mg Oral BID  . busPIRone  5 mg Oral BID  . citalopram  20 mg Oral Daily  . famotidine  20 mg Oral QHS  . insulin aspart  0-9 Units Subcutaneous Q4H  . levothyroxine  100 mcg Oral Q0600  . mirtazapine  7.5 mg Oral QHS  . sodium zirconium cyclosilicate  10 g Oral Once   Continuous Infusions: . calcium gluconate    . ceFEPime (MAXIPIME) IV    .  sodium bicarbonate  infusion 1000 mL 100 mL/hr at 12/29/18 1007   PRN Meds: polyethylene glycol  Allergies:    Allergies  Allergen Reactions  . Aspirin Other (See Comments)    HX Bleeding Ulcer   . Nsaids Other (See Comments)    Hx of bleeding ulcers  . Tolmetin Rash    Hx of bleeding ulcers    Social History:   Social History   Socioeconomic History  . Marital status: Widowed    Spouse name: Not on file  . Number of children: 2  . Years of education: Not on file  . Highest education level: Not on file  Occupational History  . Occupation: RETIRED  Social Needs  . Financial resource strain: Not on file  . Food insecurity    Worry: Not on file    Inability: Not on file  .  Transportation needs    Medical: Not on file    Non-medical: Not on file  Tobacco Use  . Smoking status: Former Smoker    Packs/day: 1.50    Years: 45.00    Pack years: 67.50    Types: Cigarettes    Start date: 11/14/1964    Quit date: 09/22/2010    Years since quitting: 8.2  . Smokeless tobacco: Never Used  . Tobacco comment: quit 2011 or 2012 - Didn't smoke during pregnancies  Substance and Sexual Activity  . Alcohol use: No  . Drug use: No  . Sexual activity: Not Currently  Lifestyle  . Physical activity    Days per week: Not on file    Minutes per session:  Not on file  . Stress: Not on file  Relationships  . Social Herbalist on phone: Not on file    Gets together: Not on file    Attends religious service: Not on file    Active member of club or organization: Not on file    Attends meetings of clubs or organizations: Not on file    Relationship status: Not on file  . Intimate partner violence    Fear of current or ex partner: Not on file    Emotionally abused: Not on file    Physically abused: Not on file    Forced sexual activity: Not on file  Other Topics Concern  . Not on file  Social History Narrative   Marital status:  Widowed as of 2010/09/29; not dating.        Children: four children (1 daughter in [place], 1 daughter in [place] estranged; 1 son in [place]; 1 son deceased in Sep 29, 2014); six grandchildren; 5 gg.      Lives: alone with a yorkie.  Son/Harry lives close.        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      Tobacco:  None; quit in 29-Sep-2010.        Alcohol: none      Drugs:none      Exercise:  No formal exercise program.  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  Cannot swim.        ADLs:  Driving; cleans own house; does grocery shopping; pays bills; does not use cane for ambulation.  Drives.        Advanced Directives: no living will; no HCPOA.  DNR/DNI.        [facility]:   Originally from [facility]. Has always  lived in [place]. She moved to [place], [place]. 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 Has a dog currently. No bird or mold exposure.     Family History:    Family History  Problem Relation Age of Onset  . Stroke Brother   . Diabetes Brother   . Tuberculosis Father   . Stroke Mother   . Stroke Sister   . Heart disease Sister   . Emphysema Sister   . Diabetes Daughter      ROS:  Please see the history of present illness.  Generalized weakness and fatigue. All other ROS reviewed and negative.     Physical Exam/Data:   Vitals:   12/29/18 0405 12/29/18 0410 12/29/18 0752 12/29/18 1000  BP:   92/77 91/70  Pulse: 77  91   Resp: 15 19 17    Temp:      TempSrc:      SpO2: 98%  98%   Weight:      Height:        Intake/Output Summary (Last 24 hours) at 12/29/2018 1210 Last data filed at 12/29/2018 0700 Gross per 24 hour  Intake 6086.47 ml  Output 200 ml  Net 5886.47 ml   Last 3 Weights 12/28/2018 12/28/2018 12/01/2018  Weight (lbs) 208 lb 195 lb 195 lb  Weight (kg) 94.348 kg 88.451 kg 88.451 kg     Body mass index is 36.85 kg/m.  General:  Well nourished, well developed, in no acute distress; chronically ill appearing HEENT: normal Lymph: no adenopathy Neck: no JVD Endocrine:  No thryomegaly Vascular: No carotid bruits; FA pulses 2+ bilaterally without bruits  Cardiac:  Irregular, no murmur Lungs:  clear to auscultation bilaterally, no wheezing, rhonchi or rales  Abd: soft, nontender, no hepatomegaly  Ext: no edema Musculoskeletal:  No deformities, BUE and BLE strength normal and equal Skin: warm and dry  Neuro:  CNs 2-12 intact, no focal abnormalities noted Psych:  Normal affect   EKG:  The EKG was personally reviewed and demonstrates: Atrial fibrillation with controlled ventricular response, RVCD, left anterior fascicular block. Telemetry:  Telemetry was personally reviewed and demonstrates:  Atrial fibrillation  Laboratory Data:   High Sensitivity Troponin:   Recent Labs  Lab 12/28/18 1000 12/28/18 1220  TROPONINIHS 49* 31*      Chemistry Recent Labs  Lab 12/28/18 1000 12/29/18 0811 12/29/18 1012  NA 136 135 137  K 5.8* 6.4* 5.2*  CL 97* 110 107  CO2 16* 11* 14*  GLUCOSE 37* 88 79  BUN 62* 59* 59*  CREATININE 4.82* 3.86* 3.98*  CALCIUM 9.4 7.9* 8.3*  GFRNONAA 8* 11* 10*  GFRAA 9* 12* 12*  ANIONGAP 23* 14 16*    Recent Labs  Lab 12/28/18 1000  PROT 6.6  ALBUMIN 3.9  AST 405*  ALT 287*  ALKPHOS 269*  BILITOT 2.9*   Hematology Recent Labs  Lab 12/28/18 1000 12/28/18 1642 12/29/18 0612  WBC 9.3 10.6* 6.9  RBC 5.33* 4.74 4.85  HGB 16.1* 14.6 14.9  HCT 51.9* 46.5* 47.2*  MCV 97.4 98.1 97.3  MCH 30.2 30.8 30.7  MCHC 31.0 31.4 31.6  RDW 15.5 15.3 15.8*  PLT 213 179 162    Radiology/Studies:  Ct Abdomen Wo Contrast  Result Date: 12/28/2018 CLINICAL DATA:  Elevated liver function tests. Nausea vomiting. EXAM: CT ABDOMEN W ITHOUT CONTRAST TECHNIQUE: Multidetector CT imaging of the abdomen was performed following the standard protocol without IV contrast. COMPARISON:  August 30, 2015 FINDINGS: Lower chest: Partially visualized area of scarring/atelectasis in the lingula. Atelectasis versus airspace consolidation in the dependent portion of the left lower lobe. Enlarged heart. Minimal pericardial thickening versus tiny effusion. Calcific atherosclerotic disease of the aorta and coronary arteries, mild. Hepatobiliary: Normal appearance of the liver. The gallbladder is not abnormally distended but demonstrates a hyperdense appearance. Pancreas: Fatty replaced pancreas. Spleen: Normal in size without focal abnormality. Adrenals/Urinary Tract: Mild left adrenal thickening. Normal nonenhanced appearance of the kidneys. Stomach/Bowel: Stomach is within normal limits. No evidence of bowel wall thickening, distention, or inflammatory changes. Vascular/Lymphatic: Aortic atherosclerosis. No enlarged abdominal  lymph nodes. Other: No abdominal wall hernia or abnormality. Musculoskeletal: Spondylosis the lumbosacral spine. IMPRESSION: 1. Unusual hyperdense appearance of the gallbladder. If the patient has had recent administration of IV contrast, this may represent vicarious excretion of contrast. Otherwise MRCP or ERCP may be considered for further investigation. 2. Enlarged heart. Minimal pericardial thickening versus tiny effusion. 3. Partially visualized area of scarring/atelectasis in the lingula. 4. Atelectasis versus airspace consolidation in the dependent portion of the left lower lobe. 5. Calcific atherosclerotic disease of the aorta and coronary arteries. Electronically Signed   By: Fidela Salisbury M.D.   On: 12/28/2018 14:22   Dg Chest Port 1 View  Result Date: 12/28/2018 CLINICAL DATA:  Generalized weakness for 3 days. EXAM: PORTABLE CHEST 1 VIEW COMPARISON:  10/15/2018 FINDINGS: The heart is enlarged but stable. There is tortuosity and calcification of the thoracic aorta. The lungs are grossly clear. No infiltrates, edema or effusions. The bony thorax is intact. IMPRESSION: Stable cardiac enlargement but no acute pulmonary findings. Electronically Signed   By: Marijo Sanes M.D.   On: 12/28/2018 10:40  Assessment and Plan:   1. Hypotension-felt likely secondary to dehydration versus sepsis.  Agree with IV fluids.  I agree with antibiotics and follow-up blood cultures.  Echocardiogram pending but cardiogenic shock seems unlikely. 2. Persistent atrial fibrillation-patient has been in atrial fibrillation since at least May.  Plan was for cardioversion.  Would continue amiodarone but decrease to 200 mg daily.  Continue apixaban.  We can pursue cardioversion as an outpatient when she recovers from present illness. 3. Mildly elevated troponin-minimally elevated.  No clear trend.  Denies chest pain.  No plans for further ischemia evaluation. 4. Cardiomyopathy-noted to have reduced LV function  previously.  We will add low-dose beta-blocker and ARB later if blood pressure and renal function allow. 5. Chronic combined systolic/diastolic congestive heart failure-she does not appear to be volume overloaded on examination.  Diuretics on hold.  Follow exam. 6. Acute on chronic stage III kidney disease-likely secondary to dehydration.  Agree with holding diuretics and hydrating.  Follow renal function.  For questions or updates, please contact [place] Please consult [email or web address] for contact info under     Signed, Kirk Ruths, MD  12/29/2018 12:10 PM

## 2018-12-30 LAB — GLUCOSE, CAPILLARY
Glucose-Capillary: 112 mg/dL — ABNORMAL HIGH (ref 70–99)
Glucose-Capillary: 120 mg/dL — ABNORMAL HIGH (ref 70–99)
Glucose-Capillary: 121 mg/dL — ABNORMAL HIGH (ref 70–99)
Glucose-Capillary: 123 mg/dL — ABNORMAL HIGH (ref 70–99)
Glucose-Capillary: 127 mg/dL — ABNORMAL HIGH (ref 70–99)
Glucose-Capillary: 129 mg/dL — ABNORMAL HIGH (ref 70–99)

## 2018-12-30 LAB — CBC
HCT: 40.6 % (ref 36.0–46.0)
Hemoglobin: 13.3 g/dL (ref 12.0–15.0)
MCH: 30.6 pg (ref 26.0–34.0)
MCHC: 32.8 g/dL (ref 30.0–36.0)
MCV: 93.3 fL (ref 80.0–100.0)
Platelets: UNDETERMINED 10*3/uL (ref 150–400)
RBC: 4.35 MIL/uL (ref 3.87–5.11)
RDW: 15.5 % (ref 11.5–15.5)
WBC: 5.6 10*3/uL (ref 4.0–10.5)
nRBC: 0.4 % — ABNORMAL HIGH (ref 0.0–0.2)

## 2018-12-30 LAB — MAGNESIUM: Magnesium: 1.9 mg/dL (ref 1.7–2.4)

## 2018-12-30 LAB — HEPATIC FUNCTION PANEL
ALT: 317 U/L — ABNORMAL HIGH (ref 0–44)
AST: 357 U/L — ABNORMAL HIGH (ref 15–41)
Albumin: 2.6 g/dL — ABNORMAL LOW (ref 3.5–5.0)
Alkaline Phosphatase: 151 U/L — ABNORMAL HIGH (ref 38–126)
Bilirubin, Direct: 0.9 mg/dL — ABNORMAL HIGH (ref 0.0–0.2)
Indirect Bilirubin: 1.2 mg/dL — ABNORMAL HIGH (ref 0.3–0.9)
Total Bilirubin: 2.1 mg/dL — ABNORMAL HIGH (ref 0.3–1.2)
Total Protein: 4.7 g/dL — ABNORMAL LOW (ref 6.5–8.1)

## 2018-12-30 LAB — URINE CULTURE: Culture: 10000 — AB

## 2018-12-30 LAB — BASIC METABOLIC PANEL
Anion gap: 16 — ABNORMAL HIGH (ref 5–15)
BUN: 62 mg/dL — ABNORMAL HIGH (ref 8–23)
CO2: 19 mmol/L — ABNORMAL LOW (ref 22–32)
Calcium: 8.2 mg/dL — ABNORMAL LOW (ref 8.9–10.3)
Chloride: 104 mmol/L (ref 98–111)
Creatinine, Ser: 4.02 mg/dL — ABNORMAL HIGH (ref 0.44–1.00)
GFR calc Af Amer: 12 mL/min — ABNORMAL LOW (ref >60)
GFR calc non Af Amer: 10 mL/min — ABNORMAL LOW (ref >60)
Glucose, Bld: 116 mg/dL — ABNORMAL HIGH (ref 70–99)
Potassium: 4.1 mmol/L (ref 3.5–5.1)
Sodium: 139 mmol/L (ref 135–145)

## 2018-12-30 LAB — AMMONIA: Ammonia: 91 umol/L — ABNORMAL HIGH (ref 9–35)

## 2018-12-30 LAB — PROCALCITONIN: Procalcitonin: 0.59 ng/mL

## 2018-12-30 LAB — VANCOMYCIN, RANDOM: Vancomycin Rm: 10

## 2018-12-30 MED ORDER — LACTULOSE 10 GM/15ML PO SOLN
30.0000 g | Freq: Once | ORAL | Status: DC
  Filled 2018-12-30: qty 45

## 2018-12-30 MED ORDER — CEFAZOLIN SODIUM-DEXTROSE 1-4 GM/50ML-% IV SOLN
1.0000 g | Freq: Two times a day (BID) | INTRAVENOUS | Status: DC
  Administered 2018-12-30 – 2019-01-02 (×6): 1 g via INTRAVENOUS
  Filled 2018-12-30 (×8): qty 50

## 2018-12-30 MED ORDER — LACTULOSE ENEMA
300.0000 mL | Freq: Every day | ORAL | Status: DC
  Administered 2018-12-30: 300 mL via RECTAL
  Filled 2018-12-30: qty 300

## 2018-12-30 MED ORDER — SODIUM CHLORIDE 0.9 % IV SOLN: INTRAVENOUS | Status: DC

## 2018-12-30 MED ORDER — LACTULOSE ENEMA
300.0000 mL | Freq: Two times a day (BID) | ORAL | Status: DC
  Filled 2018-12-30: qty 300

## 2018-12-30 NOTE — Progress Notes (Addendum)
PROGRESS NOTE    Teresa [name]  QJF:[id number] DOB: May 29, 1941 DOA: 12/28/2018 PCP: [name], MD   Brief Narrative: 78 year old with past medical history significant for hypertension, diabetes, coronary artery disease, heart failure reduced ejection fraction, A. fib, depression who was brought in because worsening weakness.  History obtained from the son: Per son patient has not been the same since July 4 weekend when patient's daughter died from congestive heart failure.  Patient has not been drinking or eating well. Patient presented with hypoglycemia, hypotensive, acute on chronic renal failure, lactic acidosis with a lactic level at 6.7.  The morning of July 15 patient noted to be more lethargic, worsening acidosis with a bicarb at 11, pH of 7.2.  CCM consulted for further evaluation.    Assessment & Plan:   Principal Problem:   Hypotension due to hypovolemia Active Problems:   Nonischemic cardiomyopathy (HCC)   Malaise and fatigue   Essential (primary) hypertension   Chronic systolic CHF (congestive heart failure), NYHA class 2 (HCC)   Moderate mitral regurgitation   Diabetes mellitus type 2, diet-controlled (HCC)   Hypothyroidism   Anxiety and depression   Acute renal failure (ARF) (HCC)   Atrial fibrillation (HCC)   Transaminitis  1-Hypotension; Concern for sepsis, versus cardiogenic shock.  Patient presented with lactic acidosis: Lactic acid at 6. Blood pressure has improved with IV fluids. 1 out of 4 blood cultures growing staph coagulase-negative.   Echocardiogram EF 25 %.  Cardiology consulted for further evaluation of cardiogenic component. Cardiology think hypotension related to hypovolemia.  Plan to continue with IV fluids.   2-Metabolic acidosis: This could be related to renal failure, versus sepsis. Started IV bicarb drip. Improved on bicarb gtt. Continue/   3-Acute on chronic renal failure: stage III, prior cr 1.8 Suspect multifactorial to  hypovolemia, hypotension. Continue with IV fluids. Monitor urine output. Avoid hypotension. Cr up to 4 today/ discussed with cardiology continue with IV fluids , repeat renal function if no improvement will get renal consult tomorrow.  Check renal US.   4-Acute metabolic encephalopathy: Suspect multifactorial related to acidosis, hypotension. ABG with a pH of 7.2, bicarb at 11. CCM consulted for evaluation for ICU admission. No need to transfer to ICU per CCM Still sleepy, wake up answer few questions.   5-1 out of 4 blood cultures positive for staph coagulase-negative: Due to presentation, lactic acidosis, hypotension.  We will continue with IV antibiotics for now. We will repeat blood cultures. Change antibiotics to Ancef.   6-Hyperkalemia: Lokelma ordered. Started bicarb drip. Received calcium gluconate. Resolved.   7-A. fib: Continue with amiodarone. Continue with Eliquis. Per nurse some possible blood after lactulose enema.  Hold eliquis tonight. Monitor for further bleeding.   8-Transaminases: Could be related to shock liver, hypotension. [place] with hepatic steatosis.   9-Hypomagnesemia: Replete IV.  10-HFrEF; Due to hypotension and lactic acidosis cardiology has been consulted for further evaluation of cardiogenic component.  11-Hypothyroidism: Continue with Synthroid.  12-Hypoglycemia: Due to poor oral intake continue with IV fluids   Estimated body mass index is 38.82 kg/m as calculated from the following:   Height as of this encounter: 5\' 3"  (1.6 m).   Weight as of this encounter: 99.4 kg.   DVT prophylaxis: Eliquis Code Status: Full code  family Communication: Disposition Plan: Remain in the hospital for treatment of hypotension, electrolyte abnormalities, renal failure. Consultants:   CCM  Cardiology  Procedures:   Ultrasound pending  Antimicrobials:  Vancomycin 7/14 Zosyn 7/15  Subjective: Sleepy, answer fee questions.  Follows some  command.   Objective: Vitals:   12/30/18 0419 12/30/18 0750 12/30/18 1157 12/30/18 1200  BP: 95/65 90/64 106/75 106/75  Pulse: 97 79 88 93  Resp:  16 20 20   Temp: (!) 97.5 F (36.4 C) 97.7 F (36.5 C) 99.6 F (37.6 C)   TempSrc: Axillary Axillary Axillary   SpO2:  100% 100% 100%  Weight:      Height:        Intake/Output Summary (Last 24 hours) at 12/30/2018 1302 Last data filed at 12/30/2018 0700 Gross per 24 hour  Intake 2329.55 ml  Output -  Net 2329.55 ml   Filed Weights   12/28/18 1222 12/28/18 1309 12/30/18 0325  Weight: 88.5 kg 94.3 kg 99.4 kg    Examination:  General exam: sleepy  Respiratory system: CTA Cardiovascular system: S 1, S 2 RRR Gastrointestinal system: BS present, soft, nt Central nervous system: lethargic, follows some command.  Extremities: Sno edema Skin: no rashes.   Data Reviewed: I have personally reviewed following labs and imaging studies  CBC: Recent Labs  Lab 12/28/18 1000 12/28/18 1642 12/29/18 0612 12/30/18 1011  WBC 9.3 10.6* 6.9 5.6  NEUTROABS  --  7.6  --   --   HGB 16.1* 14.6 14.9 13.3  HCT 51.9* 46.5* 47.2* 40.6  MCV 97.4 98.1 97.3 93.3  PLT 213 179 162 PLATELET CLUMPS NOTED ON SMEAR, UNABLE TO ESTIMATE   Basic Metabolic Panel: Recent Labs  Lab 12/28/18 1000 12/29/18 0612 12/29/18 0811 12/29/18 1012 12/29/18 1845 12/30/18 1011  NA 136  --  135 137 136 139  K 5.8*  --  6.4* 5.2* 5.1 4.1  CL 97*  --  110 107 108 104  CO2 16*  --  11* 14* 15* 19*  GLUCOSE 37*  --  88 79 140* 116*  BUN 62*  --  59* 59* 61* 62*  CREATININE 4.82*  --  3.86* 3.98* 3.86* 4.02*  CALCIUM 9.4  --  7.9* 8.3* 8.1* 8.2*  MG  --  1.9  --   --   --   --    GFR: Estimated Creatinine Clearance: 13 mL/min (A) (by C-G formula based on SCr of 4.02 mg/dL (H)). Liver Function Tests: Recent Labs  Lab 12/28/18 1000 12/30/18 1011  AST 405* 357*  ALT 287* 317*  ALKPHOS 269* 151*  BILITOT 2.9* 2.1*  PROT 6.6 4.7*  ALBUMIN 3.9 2.6*   No  results for input(s): LIPASE, AMYLASE in the last 168 hours. No results for input(s): AMMONIA in the last 168 hours. Coagulation Profile: No results for input(s): INR, PROTIME in the last 168 hours. Cardiac Enzymes: No results for input(s): CKTOTAL, CKMB, CKMBINDEX, TROPONINI in the last 168 hours. BNP (last 3 results) No results for input(s): PROBNP in the last 8760 hours. HbA1C: No results for input(s): HGBA1C in the last 72 hours. CBG: Recent Labs  Lab 12/29/18 1941 12/29/18 2325 12/30/18 0330 12/30/18 0748 12/30/18 1156  GLUCAP 114* 124* 121* 129* 112*   Lipid Profile: No results for input(s): CHOL, HDL, LDLCALC, TRIG, CHOLHDL, LDLDIRECT in the last 72 hours. Thyroid Function Tests: No results for input(s): TSH, T4TOTAL, FREET4, T3FREE, THYROIDAB in the last 72 hours. Anemia Panel: Recent Labs    12/28/18 1421  FERRITIN 77   Sepsis Labs: Recent Labs  Lab 12/28/18 1002 12/28/18 1421 12/28/18 1521 12/29/18 0612 12/29/18 0811 12/29/18 1012 12/30/18 0621  PROCALCITON  --  0.57  --  0.62  --   --  0.59  LATICACIDVEN 6.9*  --  4.9*  --  2.7* 2.6*  --     Recent Results (from the past 240 hour(s))  Blood culture (routine x 2)     Status: Abnormal (Preliminary result)   Collection Time: 12/28/18 10:01 AM   Specimen: BLOOD  Result Value Ref Range Status   Specimen Description BLOOD LEFT ANTECUBITAL  Final   Special Requests   Final    BOTTLES DRAWN AEROBIC AND ANAEROBIC Blood Culture adequate volume   Culture  Setup Time   Final    GRAM POSITIVE COCCI ANAEROBIC BOTTLE ONLY CRITICAL RESULT CALLED TO, READ BACK BY AND VERIFIED WITH: PHARMD V BRYK [id number] MLM    Culture (A)  Final    STAPHYLOCOCCUS SPECIES (COAGULASE NEGATIVE) THE SIGNIFICANCE OF ISOLATING THIS ORGANISM FROM A SINGLE SET OF BLOOD CULTURES WHEN MULTIPLE SETS ARE DRAWN IS UNCERTAIN. PLEASE NOTIFY THE MICROBIOLOGY DEPARTMENT WITHIN ONE WEEK IF SPECIATION AND SENSITIVITIES ARE REQUIRED. Performed  at [facility], [place] [address], [place], [place] [address]    Report Status PENDING  Incomplete  Blood Culture ID Panel (Reflexed)     Status: Abnormal   Collection Time: 12/28/18 10:01 AM  Result Value Ref Range Status   Enterococcus species NOT DETECTED NOT DETECTED Final   Listeria monocytogenes NOT DETECTED NOT DETECTED Final   Staphylococcus species DETECTED (A) NOT DETECTED Final    Comment: Methicillin (oxacillin) susceptible coagulase negative staphylococcus. Possible blood culture contaminant (unless isolated from more than one blood culture draw or clinical case suggests pathogenicity). No antibiotic treatment is indicated for blood  culture contaminants. CRITICAL RESULT CALLED TO, READ BACK BY AND VERIFIED WITH: PHARMD V BRYK [id number] MLM    Staphylococcus aureus (BCID) NOT DETECTED NOT DETECTED Final   Methicillin resistance NOT DETECTED NOT DETECTED Final   Streptococcus species NOT DETECTED NOT DETECTED Final   Streptococcus agalactiae NOT DETECTED NOT DETECTED Final   Streptococcus pneumoniae NOT DETECTED NOT DETECTED Final   Streptococcus pyogenes NOT DETECTED NOT DETECTED Final   Acinetobacter baumannii NOT DETECTED NOT DETECTED Final   Enterobacteriaceae species NOT DETECTED NOT DETECTED Final   Enterobacter cloacae complex NOT DETECTED NOT DETECTED Final   Escherichia coli NOT DETECTED NOT DETECTED Final   Klebsiella oxytoca NOT DETECTED NOT DETECTED Final   Klebsiella pneumoniae NOT DETECTED NOT DETECTED Final   Proteus species NOT DETECTED NOT DETECTED Final   Serratia marcescens NOT DETECTED NOT DETECTED Final   Haemophilus influenzae NOT DETECTED NOT DETECTED Final   Neisseria meningitidis NOT DETECTED NOT DETECTED Final   Pseudomonas aeruginosa NOT DETECTED NOT DETECTED Final   Candida albicans NOT DETECTED NOT DETECTED Final   Candida glabrata NOT DETECTED NOT DETECTED Final   Candida krusei NOT DETECTED NOT DETECTED Final   Candida parapsilosis  NOT DETECTED NOT DETECTED Final   Candida tropicalis NOT DETECTED NOT DETECTED Final    Comment: Performed at [facility], [address], [place],  [address]  Blood culture (routine x 2)     Status: None (Preliminary result)   Collection Time: 12/28/18 10:06 AM   Specimen: BLOOD  Result Value Ref Range Status   Specimen Description BLOOD SITE NOT SPECIFIED  Final   Special Requests   Final    BOTTLES DRAWN AEROBIC ONLY Blood Culture results may not be optimal due to an inadequate volume of blood received in culture bottles   Culture   Final  NO GROWTH 1 DAY Performed at [facility], [place] [address] [place], [place] [address]    Report Status PENDING  Incomplete  SARS Coronavirus 2 (CEPHEID- Performed in [place] hospital lab), Hosp Order     Status: None   Collection Time: 12/28/18 10:14 AM   Specimen: Nasopharyngeal Swab  Result Value Ref Range Status   SARS Coronavirus 2 NEGATIVE NEGATIVE Final    Comment: (NOTE) If result is NEGATIVE SARS-CoV-2 target nucleic acids are NOT DETECTED. The SARS-CoV-2 RNA is generally detectable in upper and lower  respiratory specimens during the acute phase of infection. The lowest  concentration of SARS-CoV-2 viral copies this assay can detect is 250  copies / mL. A negative result does not preclude SARS-CoV-2 infection  and should not be used as the sole basis for treatment or other  patient management decisions.  A negative result may occur with  improper specimen collection / handling, submission of specimen other  than nasopharyngeal swab, presence of viral mutation(s) within the  areas targeted by this assay, and inadequate number of viral copies  (<250 copies / mL). A negative result must be combined with clinical  observations, patient history, and epidemiological information. If result is POSITIVE SARS-CoV-2 target nucleic acids are DETECTED. The SARS-CoV-2 RNA is generally detectable in upper and lower   respiratory specimens dur ing the acute phase of infection.  Positive  results are indicative of active infection with SARS-CoV-2.  Clinical  correlation with patient history and other diagnostic information is  necessary to determine patient infection status.  Positive results do  not rule out bacterial infection or co-infection with other viruses. If result is PRESUMPTIVE POSTIVE SARS-CoV-2 nucleic acids MAY BE PRESENT.   A presumptive positive result was obtained on the submitted specimen  and confirmed on repeat testing.  While 2019 novel coronavirus  (SARS-CoV-2) nucleic acids may be present in the submitted sample  additional confirmatory testing may be necessary for epidemiological  and / or clinical management purposes  to differentiate between  SARS-CoV-2 and other Sarbecovirus currently known to infect humans.  If clinically indicated additional testing with an alternate test  methodology [phone]) is advised. The SARS-CoV-2 RNA is generally  detectable in upper and lower respiratory sp ecimens during the acute  phase of infection. The expected result is Negative. Fact Sheet for Patients:  StrictlyIdeas.no Fact Sheet for Healthcare Providers: BankingDealers.co.za This test is not yet approved or cleared by the [place] FDA and has been authorized for detection and/or diagnosis of SARS-CoV-2 by FDA under an Emergency Use Authorization (EUA).  This EUA will remain in effect (meaning this test can be used) for the duration of the COVID-19 declaration under Section 564(b)(1) of the Act, 21 U.S.C. section 360bbb-3(b)(1), unless the authorization is terminated or revoked sooner. Performed at [facility], [place] [address], [place], [place] [address]   Urine culture     Status: Abnormal   Collection Time: 12/28/18  2:15 PM   Specimen: In/Out Cath Urine  Result Value Ref Range Status   Specimen Description IN/OUT CATH  URINE  Final   Special Requests   Final    NONE Performed at [facility], [place] [address], [place], [place] [address]    Culture 10,000 COLONIES/mL ESCHERICHIA COLI (A)  Final   Report Status 12/30/2018 FINAL  Final   Organism ID, Bacteria ESCHERICHIA COLI (A)  Final      Susceptibility   Escherichia coli - MIC*  AMPICILLIN >=32 RESISTANT Resistant     CEFAZOLIN <=4 SENSITIVE Sensitive     CEFTRIAXONE <=1 SENSITIVE Sensitive     CIPROFLOXACIN <=0.25 SENSITIVE Sensitive     GENTAMICIN <=1 SENSITIVE Sensitive     IMIPENEM <=0.25 SENSITIVE Sensitive     NITROFURANTOIN <=16 SENSITIVE Sensitive     TRIMETH/SULFA <=20 SENSITIVE Sensitive     AMPICILLIN/SULBACTAM 8 SENSITIVE Sensitive     PIP/TAZO <=4 SENSITIVE Sensitive     Extended ESBL NEGATIVE Sensitive     * 10,000 COLONIES/mL ESCHERICHIA COLI  MRSA PCR Screening     Status: None   Collection Time: 12/28/18  5:15 PM   Specimen: Nasopharyngeal  Result Value Ref Range Status   MRSA by PCR NEGATIVE NEGATIVE Final    Comment:        The GeneXpert MRSA Assay (FDA approved for NASAL specimens only), is one component of a comprehensive MRSA colonization surveillance program. It is not intended to diagnose MRSA infection nor to guide or monitor treatment for MRSA infections. Performed at [facility], [place] [address] [place],  [address]          Radiology Studies: Ct Abdomen Wo Contrast  Result Date: 12/28/2018 CLINICAL DATA:  Elevated liver function tests. Nausea vomiting. EXAM: CT ABDOMEN W ITHOUT CONTRAST TECHNIQUE: Multidetector CT imaging of the abdomen was performed following the standard protocol without IV contrast. COMPARISON:  August 30, 2015 FINDINGS: Lower chest: Partially visualized area of scarring/atelectasis in the lingula. Atelectasis versus airspace consolidation in the dependent portion of the left lower lobe. Enlarged heart. Minimal pericardial thickening versus tiny effusion. Calcific  atherosclerotic disease of the aorta and coronary arteries, mild. Hepatobiliary: Normal appearance of the liver. The gallbladder is not abnormally distended but demonstrates a hyperdense appearance. Pancreas: Fatty replaced pancreas. Spleen: Normal in size without focal abnormality. Adrenals/Urinary Tract: Mild left adrenal thickening. Normal nonenhanced appearance of the kidneys. Stomach/Bowel: Stomach is within normal limits. No evidence of bowel wall thickening, distention, or inflammatory changes. Vascular/Lymphatic: Aortic atherosclerosis. No enlarged abdominal lymph nodes. Other: No abdominal wall hernia or abnormality. Musculoskeletal: Spondylosis the lumbosacral spine. IMPRESSION: 1. Unusual hyperdense appearance of the gallbladder. If the patient has had recent administration of IV contrast, this may represent vicarious excretion of contrast. Otherwise MRCP or ERCP may be considered for further investigation. 2. Enlarged heart. Minimal pericardial thickening versus tiny effusion. 3. Partially visualized area of scarring/atelectasis in the lingula. 4. Atelectasis versus airspace consolidation in the dependent portion of the left lower lobe. 5. Calcific atherosclerotic disease of the aorta and coronary arteries. Electronically Signed   By: Fidela Salisbury M.D.   On: 12/28/2018 14:22   US Abdomen Limited Ruq  Result Date: 12/29/2018 CLINICAL DATA:  Abnormal transaminase level. EXAM: ULTRASOUND ABDOMEN LIMITED RIGHT UPPER QUADRANT COMPARISON:  CT scan of December 28, 2018. FINDINGS: Gallbladder: No gallstones or wall thickening visualized. No sonographic Murphy sign noted by sonographer. Common bile duct: Diameter: 4 mm which is within normal limits. Liver: No focal lesion identified. Increased echogenicity of hepatic parenchyma is noted suggesting hepatic steatosis or possibly other diffuse hepatocellular disease. Portal vein is patent on color Doppler imaging with normal direction of blood flow towards  the liver. IMPRESSION: Mildly increased echogenicity of hepatic parenchyma is noted suggesting hepatic steatosis or other diffuse hepatocellular disease. No other abnormality seen in the right upper quadrant of the abdomen. Electronically Signed   By: Marijo Conception M.D.   On: 12/29/2018 12:08  Scheduled Meds: . amiodarone  200 mg Oral Daily  . apixaban  5 mg Oral BID  . busPIRone  5 mg Oral BID  . citalopram  20 mg Oral Daily  . famotidine  20 mg Oral QHS  . feeding supplement  1 Container Oral TID BM  . insulin aspart  0-9 Units Subcutaneous Q4H  . levothyroxine  100 mcg Oral Q0600  . mirtazapine  7.5 mg Oral QHS  . vancomycin variable dose per unstable renal function (pharmacist dosing)   Does not apply See admin instructions   Continuous Infusions: . piperacillin-tazobactam (ZOSYN)  IV Stopped (12/29/18 2333)  .  sodium bicarbonate  infusion 1000 mL 75 mL/hr at 12/30/18 1128     LOS: 2 days    Time spent: 35 minutes.    Elmarie Shiley, MD [facility] Pager [phone]  If 7PM-7AM, please contact night-coverage [email or web address] Password [email or web address] 12/30/2018, 1:01 PM

## 2018-12-30 NOTE — Progress Notes (Signed)
Progress Note  Patient Name: Teresa [name] Date of Encounter: 12/30/2018  Primary Cardiologist: Minus Breeding, MD   Subjective   Denies CP or dyspnea  Inpatient Medications    Scheduled Meds:  amiodarone  200 mg Oral Daily   apixaban  5 mg Oral BID   busPIRone  5 mg Oral BID   citalopram  20 mg Oral Daily   famotidine  20 mg Oral QHS   feeding supplement  1 Container Oral TID BM   insulin aspart  0-9 Units Subcutaneous Q4H   levothyroxine  100 mcg Oral Q0600   mirtazapine  7.5 mg Oral QHS   vancomycin variable dose per unstable renal function (pharmacist dosing)   Does not apply See admin instructions   Continuous Infusions:  piperacillin-tazobactam (ZOSYN)  IV Stopped (12/29/18 2333)    sodium bicarbonate  infusion 1000 mL 100 mL/hr at 12/30/18 0010   PRN Meds: polyethylene glycol   Vital Signs    Vitals:   12/30/18 0300 12/30/18 0325 12/30/18 0419 12/30/18 0750  BP:  (!) 67/33 95/65 90/64   Pulse:  91 97 79  Resp: (!) 21 18  16   Temp:  (!) 92.1 F (33.4 C) (!) 97.5 F (36.4 C) 97.7 F (36.5 C)  TempSrc:  Axillary Axillary Axillary  SpO2:  100%  100%  Weight:  99.4 kg    Height:        Intake/Output Summary (Last 24 hours) at 12/30/2018 1019 Last data filed at 12/30/2018 0700 Gross per 24 hour  Intake 2329.55 ml  Output --  Net 2329.55 ml   Last 3 Weights 12/30/2018 12/28/2018 12/28/2018  Weight (lbs) 219 lb 2.2 oz 208 lb 195 lb  Weight (kg) 99.4 kg 94.348 kg 88.451 kg      Telemetry    Atrial fibrillation rate controlled - Personally Reviewed   Physical Exam   GEN: No acute distress.  Chronically ill appearing Neck: No JVD Cardiac: irregular Respiratory: Clear to auscultation bilaterally. GI: Soft, nontender, non-distended  MS: No edema Neuro:  Nonfocal  Psych: Normal affect   Labs    High Sensitivity Troponin:   Recent Labs  Lab 12/28/18 1000 12/28/18 1220  TROPONINIHS 49* 31*      Chemistry Recent Labs  Lab  12/28/18 1000 12/29/18 0811 12/29/18 1012 12/29/18 1845  NA 136 135 137 136  K 5.8* 6.4* 5.2* 5.1  CL 97* 110 107 108  CO2 16* 11* 14* 15*  GLUCOSE 37* 88 79 140*  BUN 62* 59* 59* 61*  CREATININE 4.82* 3.86* 3.98* 3.86*  CALCIUM 9.4 7.9* 8.3* 8.1*  PROT 6.6  --   --   --   ALBUMIN 3.9  --   --   --   AST 405*  --   --   --   ALT 287*  --   --   --   ALKPHOS 269*  --   --   --   BILITOT 2.9*  --   --   --   GFRNONAA 8* 11* 10* 11*  GFRAA 9* 12* 12* 12*  ANIONGAP 23* 14 16* 13     Hematology Recent Labs  Lab 12/28/18 1000 12/28/18 1642 12/29/18 0612  WBC 9.3 10.6* 6.9  RBC 5.33* 4.74 4.85  HGB 16.1* 14.6 14.9  HCT 51.9* 46.5* 47.2*  MCV 97.4 98.1 97.3  MCH 30.2 30.8 30.7  MCHC 31.0 31.4 31.6  RDW 15.5 15.3 15.8*  PLT 213 179 162  Radiology    Ct Abdomen Wo Contrast  Result Date: 12/28/2018 CLINICAL DATA:  Elevated liver function tests. Nausea vomiting. EXAM: CT ABDOMEN W ITHOUT CONTRAST TECHNIQUE: Multidetector CT imaging of the abdomen was performed following the standard protocol without IV contrast. COMPARISON:  August 30, 2015 FINDINGS: Lower chest: Partially visualized area of scarring/atelectasis in the lingula. Atelectasis versus airspace consolidation in the dependent portion of the left lower lobe. Enlarged heart. Minimal pericardial thickening versus tiny effusion. Calcific atherosclerotic disease of the aorta and coronary arteries, mild. Hepatobiliary: Normal appearance of the liver. The gallbladder is not abnormally distended but demonstrates a hyperdense appearance. Pancreas: Fatty replaced pancreas. Spleen: Normal in size without focal abnormality. Adrenals/Urinary Tract: Mild left adrenal thickening. Normal nonenhanced appearance of the kidneys. Stomach/Bowel: Stomach is within normal limits. No evidence of bowel wall thickening, distention, or inflammatory changes. Vascular/Lymphatic: Aortic atherosclerosis. No enlarged abdominal lymph nodes. Other: No  abdominal wall hernia or abnormality. Musculoskeletal: Spondylosis the lumbosacral spine. IMPRESSION: 1. Unusual hyperdense appearance of the gallbladder. If the patient has had recent administration of IV contrast, this may represent vicarious excretion of contrast. Otherwise MRCP or ERCP may be considered for further investigation. 2. Enlarged heart. Minimal pericardial thickening versus tiny effusion. 3. Partially visualized area of scarring/atelectasis in the lingula. 4. Atelectasis versus airspace consolidation in the dependent portion of the left lower lobe. 5. Calcific atherosclerotic disease of the aorta and coronary arteries. Electronically Signed   By: Fidela Salisbury M.D.   On: 12/28/2018 14:22   Dg Chest Port 1 View  Result Date: 12/28/2018 CLINICAL DATA:  Generalized weakness for 3 days. EXAM: PORTABLE CHEST 1 VIEW COMPARISON:  10/15/2018 FINDINGS: The heart is enlarged but stable. There is tortuosity and calcification of the thoracic aorta. The lungs are grossly clear. No infiltrates, edema or effusions. The bony thorax is intact. IMPRESSION: Stable cardiac enlargement but no acute pulmonary findings. Electronically Signed   By: Marijo Sanes M.D.   On: 12/28/2018 10:40   US Abdomen Limited Ruq  Result Date: 12/29/2018 CLINICAL DATA:  Abnormal transaminase level. EXAM: ULTRASOUND ABDOMEN LIMITED RIGHT UPPER QUADRANT COMPARISON:  CT scan of December 28, 2018. FINDINGS: Gallbladder: No gallstones or wall thickening visualized. No sonographic Murphy sign noted by sonographer. Common bile duct: Diameter: 4 mm which is within normal limits. Liver: No focal lesion identified. Increased echogenicity of hepatic parenchyma is noted suggesting hepatic steatosis or possibly other diffuse hepatocellular disease. Portal vein is patent on color Doppler imaging with normal direction of blood flow towards the liver. IMPRESSION: Mildly increased echogenicity of hepatic parenchyma is noted suggesting hepatic  steatosis or other diffuse hepatocellular disease. No other abnormality seen in the right upper quadrant of the abdomen. Electronically Signed   By: Marijo Conception M.D.   On: 12/29/2018 12:08    Patient Profile     Teresa [name] is a 78 y.o. female with a hx of paroxysmal atrial fibrillation, chronic systolic congestive heart failure, diabetes mellitus, hypothyroidism, anxiety who is being seen today for the evaluation of atrial fibrillation and hypotension. Echocardiogram February 2020 showed ejection fraction 35 to 40%, biatrial enlargement, mild aortic insufficiency.  Transesophageal echocardiogram March 2020 showed ejection fraction 40 to 45%, moderate left atrial enlargement, moderate to severe mitral regurgitation and mild to moderate tricuspid regurgitation.  Had cardioversion at that time but atrial fibrillation recurred. Pt was started on amiodarone with plans ultimately to proceed with cardioversion.  Patient apparently has had gradual decline since July 4 weekend when her  daughter died from congestive heart failure.  She was admitted on July 14 with generalized weakness and fatigue.  She was found to be in acute renal failure, hypotensive, possible sepsis and with lactic acidosis all felt secondary to dehydration.   Echocardiogram this admission shows ejection fraction 20 to 25%, biatrial enlargement, moderate mitral regurgitation and moderate to severe tricuspid regurgitation.  Assessment & Plan    1. Hypotension-this is felt likely secondary to dehydration.  We will increase normal saline to 100 cc/h; given reduced LV function, follow for volume excess.  Question sepsis; antibiotics per primary care. 2. Persistent atrial fibrillation-patient has been in atrial fibrillation since at least May.  Plan was for cardioversion.  Would continue amiodarone 200 mg daily.  Continue apixaban.  We can pursue cardioversion as an outpatient when she recovers from present illness. 3. Mildly elevated  troponin-minimally elevated.  No clear trend.  Denies chest pain.  No plans for further ischemia evaluation. 4. Cardiomyopathy-LV function was reduced on prior echo but somewhat worse now.  We will add low-dose beta-blocker and ARB later if blood pressure and renal function allow.  Not clear to me that she is a candidate for aggressive cardiac evaluation but can reassess as she improves. 5. Chronic combined systolic/diastolic congestive heart failure-Diuretics on hold.    Hydrating as outlined above.  Follow exam. 6. Acute on chronic stage III kidney disease-possibly secondary to dehydration.  Some improvement.  Continue hydration and follow.  For questions or updates, please contact [place] Please consult [email or web address] for contact info under        Signed, Kirk Ruths, MD  12/30/2018, 10:19 AM

## 2018-12-30 NOTE — Evaluation (Signed)
Physical Therapy Evaluation Patient Details Name: Teresa [name] MRN: [id number] DOB: September 21, 1940 Today's Date: 12/30/2018   History of Present Illness  Pt adm with weakness, hypotension, acute on chronic renal failure. PMH - afib, chf, anxiety, dm, cad,   Clinical Impression  Pt presents to PT lethargic with little ability to participate today. If pt improves medically expect her mobility will improve and may be able to return home with family. If status doesn't improve may need to look at other DC options. Family also working with Palliative Care for Goals of Care.     Follow Up Recommendations Home health PT;Supervision/Assistance - 24 hour(if pt progresses to level that family can manage)    Equipment Recommendations  Other (comment)(to be determined)    Recommendations for Other Services       Precautions / Restrictions Precautions Precautions: Fall Restrictions Weight Bearing Restrictions: No      Mobility  Bed Mobility Overal bed mobility: Needs Assistance Bed Mobility: Rolling Rolling: +2 for physical assistance;Total assist         General bed mobility comments: assist for all aspects  Transfers                 General transfer comment: Unable to attempt due to lethargy  Ambulation/Gait                Stairs            Wheelchair Mobility    Modified Rankin (Stroke Patients Only)       Balance                                             Pertinent Vitals/Pain Pain Assessment: Faces Faces Pain Scale: No hurt    Home Living Family/patient expects to be discharged to:: Private residence Living Arrangements: Children Available Help at Discharge: Family;Available 24 hours/day Type of Home: House Home Access: Level entry     Home Layout: One level Home Equipment: Cane - single point;Grab bars - toilet;Toilet riser;Tub bench;Bedside commode;Walker - 2 wheels Additional Comments: Information from prior encounter     Prior Function Level of Independence: Needs assistance   Gait / Transfers Assistance Needed: amb short distance with walker. Unsure if unassisted or with assist           Hand Dominance        Extremity/Trunk Assessment   Upper Extremity Assessment Upper Extremity Assessment: Defer to OT evaluation    Lower Extremity Assessment Lower Extremity Assessment: Difficult to assess due to impaired cognition       Communication   Communication: HOH  Cognition Arousal/Alertness: Lethargic Behavior During Therapy: Flat affect Overall Cognitive Status: Difficult to assess                                 General Comments: Pt required frequent verbal/tactile stimuli to awaken and then back to sleep quickly      General Comments      Exercises     Assessment/Plan    PT Assessment Patient needs continued PT services  PT Problem List Decreased strength;Decreased activity tolerance;Decreased balance;Decreased mobility       PT Treatment Interventions DME instruction;Gait training;Functional mobility training;Therapeutic activities;Therapeutic exercise;Balance training;Patient/family education    PT Goals (Current goals can be found in the Care Plan section)  Acute Rehab PT Goals Patient Stated Goal: Pt unable PT Goal Formulation: Patient unable to participate in goal setting Time For Goal Achievement: 01/13/19 Potential to Achieve Goals: Fair    Frequency Min 3X/week   Barriers to discharge        Co-evaluation               AM-PAC PT "6 Clicks" Mobility  Outcome Measure Help needed turning from your back to your side while in a flat bed without using bedrails?: Total Help needed moving from lying on your back to sitting on the side of a flat bed without using bedrails?: Total Help needed moving to and from a bed to a chair (including a wheelchair)?: Total Help needed standing up from a chair using your arms (e.g., wheelchair or bedside  chair)?: Total Help needed to walk in hospital room?: Total Help needed climbing 3-5 steps with a railing? : Total 6 Click Score: 6    End of Session   Activity Tolerance: Patient limited by lethargy Patient left: in bed;with call bell/phone within reach;with bed alarm set Nurse Communication: Mobility status PT Visit Diagnosis: Other abnormalities of gait and mobility (R26.89)    Time: 1324-1340 PT Time Calculation (min) (ACUTE ONLY): 16 min   Charges:   PT Evaluation $PT Eval Moderate Complexity: 1 Mod          [place] Pager [phone] Office [place] 12/30/2018, 5:31 PM

## 2018-12-30 NOTE — Consult Note (Signed)
Consultation Note Date: 12/30/2018   Patient Name: Teresa [name]  DOB: 06/02/1941  MRN: [id number]  Age / Sex: 78 y.o., female   PCP: [name], MD Referring Physician: Elmarie Shiley, MD   REASON FOR CONSULTATION:Establishing goals of care  Palliative Care consult requested for this 78 y.o. female with multiple medical problems including hypertension, diabetes, arthritis, glaucoma, CAD, systolic congestive heart failure, atrial fibrillation, and depression. She presented from home with son to ED with complaints of generalized weakness, poor appetite, and difficulty walking. Son reports patient is depressed as her only daughter passed away on 01/04/2023 due to CHF complications. Since admission patient continues to be lethargic, with hypotension, lethargy, and worsening renal failure.  Palliative Medicine team to assist with goals of care discussion.   Clinical Assessment and Goals of Care: I have reviewed medical records including lab results, imaging, Epic notes, and MAR, received report from the bedside RN, and assessed the patient. I spoke at length over the phone to patient's son, Teresa [name] to discuss diagnosis prognosis, [place], EOL wishes, disposition and options. Patient is lethargic, easily aroused and will answer a few questions appropriately (name, dob, year, and can identify who her family members are when their names are called). She continues to drift back to sleep during discussion requiring stimulation or loud name calling to awake. She does follow commands.   I introduced Palliative Medicine as specialized medical care for people living with serious illness. It focuses on providing relief from the symptoms and stress of a serious illness. The goal is to improve quality of life for both the patient and the family.  We discussed a brief life review of the patient, along with her functional and nutritional status. Son reports patient is a strong willed woman. She had 3  children and unfortunately her oldest son died 4 years ago and her only daughter this past Jan 04, 2023 due to CHF complications. She lives with her only surviving child, Teresa [name] and his family. She has a dog name Sugar, grandson Geryl Rankins), and expecting another grandchild in October. Son states she loved spending time with family.   Prior to admission she was ambulatory with a walker. He states her appetite was good up until 2-3 weeks ago. Son originally felt that patient was grieving over the loss of his sister, however, her condition continued to worsen, she was barely eating or drinking, and became so weak she could hardly stand up or get out of bed.   We discussed Her current illness and what it means in the larger context of Her on-going co-morbidities. With specific discussions regarding her poor po intake/dehydration, CHF, hypotension, renal failure, and depression. Natural disease trajectory and expectations at EOL were discussed. Teresa [name] verbalized understanding and appreciation of updates from medical team.   He remains hopeful his mother will show some signs of improvement, but is worried that she has "lost her will to live since my sister died and may be so far gone causing her to die eventually!" Support given. He is tearful in conversation, expressing his love and compassion for his mother. 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 He states "momma was constantly beating the odds!"   I attempted to elicit values and goals of care important to the patient.    The difference between aggressive medical intervention and comfort  care was considered in light of the patient's goals of care. At this time son is requesting to continuing with all aggressive medical interventions to allow patient a fighting chance. He again expressed that he is concerned that she may not pull through this  but is remaining somewhat hopeful.   Advanced directives, concepts specific to code status, artifical feeding and hydration, and rehospitalization were considered and discussed. Patient has a documented advanced directive. Son, Teresa [name] is her HCPOA. We discuss in detail her current full code status with consideration to her current illness and co-morbidities. Son is tearful and stated "momma told me she would want to be resuscitated and placed on life support to see what her chances would be! I know it is probably not the right thing to do in her situation, but I don't know if I can dishonor her wishes!" I have to let God make the decision. Emotional support given. He wants her to live, but not with a poor quality of life or on life support or continuous machines such as feeding tubes or HD.   I encouraged son to consider his statement regarding his mother's wishes and also his statement regarding "Go to make the decision". I discussed with him the state of health his mother was in when she made her wishes known, which he agreed at that time she wasn't as ill as she is now or with some of the major health concerns. I discussed with him considering patient's quality of life with comparison to her prime years, earlier at the beginning of the year, and the months over this past year. As he began sharing noticeable declines and health challenges he became tearful expressing his awareness now that he is focusing on things. Support given. We also discussed in the event patient had a cardiac/pulmonary arrest providing heroic measures could be viewed as altering God's plan as we artificially work to regain life (depending on situation). Son is tearful and verbalized appreciation of discussion. He states he now feels more comfortable considering his options, however, he remains torn at what it is he would want to do. Support given. He request to remain a full code.    Hospice and Palliative Care services outpatient  were explained and offered. Son verbalized his understanding and awareness of both palliative and hospice's goals and philosophy of care. Patient was previously under the care of hospice, however her PCP advised she was not appropriate and community hospice was rescinded as well as DNR.   Offered son to come in for face to face continued discussion and to visit mom and see her condition. He verbalized appreciation and is contacting his employer.   Questions and concerns were addressed. The family was encouraged to call with questions or concerns.  PMT will continue to support holistically.  Son called back requesting to come in and see his mother this afternoon and follow up tomorrow morning at 0900 for continued [place] discussion at the bedside. Given patient continued lethargy and inability to engage in [place] or decision making this is a reasonable and supported request. Approval obtained for son's visitation.    SOCIAL HISTORY:     reports that she quit smoking about 8 years ago. Her smoking use included cigarettes. She started smoking about 54 years ago. She has a 67.50 pack-year smoking history. She has never used smokeless tobacco. She reports that she does not drink alcohol or use drugs.  CODE STATUS: Full code  ADVANCE DIRECTIVES: Teresa [name] (Son/POA)  SYMPTOM MANAGEMENT: per attending   Palliative Prophylaxis:   Aspiration, Bowel Regimen, Delirium Protocol, Frequent Pain Assessment, Oral Care and Turn Reposition  PSYCHO-SOCIAL/SPIRITUAL:  Support System: Family   Desire for further Chaplaincy support: NO   Additional Recommendations (Limitations, Scope, Preferences):  Full Scope Treatment   PAST MEDICAL HISTORY: Past Medical History:  Diagnosis Date   Abdominal pain    Anxiety    maintained on Xanax tid for years.   Arthritis    FEET, KNEES, HANDS   Atrial fibrillation ([place]) 63/8756   Chronic systolic CHF (congestive heart failure), NYHA class 2 (HCC)    EF 25%  2011 MV,  50-55% echo 6/13   Diabetes mellitus type 2, diet-controlled (HCC)    GERD (gastroesophageal reflux disease)    Glaucoma    s/p surgery B in 30s.  [place].   H/O hiatal hernia    History of glaucoma    History of small bowel obstruction 2009 AND NOV 2012   HTN (hypertension)    Hypothyroidism    Left ovarian cyst    Moderate mitral regurgitation    Moderate tricuspid regurgitation    Non-healing surgical wound    ABDOMINAL S/P EXPLORATOY LAPAROTOMY 04-18-2011   Nonischemic cardiomyopathy ([place]) [facility]    EF is 25% per echo February 2012, non ischemic myoview 12/2009.  EF 50% echo 7/12 and plans for ICD cancelled.    Peptic ulcer disease    PVC (premature ventricular contraction)    Spinal stenosis    Urge urinary incontinence    Valvular heart disease    Moderate to severe MR, moderate TR, LAE per echo 7/11   Wears dentures    upper denture-lower partial    PAST SURGICAL HISTORY:  Past Surgical History:  Procedure Laterality Date   ABDOMINAL HERNIA REPAIR  32 YRS AGO   [place]   RUPTURED   BENIGN RIGHT BREAST TUMOR REMOVED     CARDIOVASCULAR STRESS TEST  JULY 2011-  DR HOCHREIN   NO ISCHEMIA   CARDIOVERSION N/A 08/17/2018   Procedure: CARDIOVERSION;  Surgeon: Sueanne Margarita, MD;  Location: [facility];  Service: Cardiovascular;  Laterality: N/A;   EXPLORATOMY LAP. / EXTENSIVE LYSIS ADHESIONS/ SMALL BOWEL RESECTION X2 WITH PRIMARY ANASTOMOSIS X2  04-18-2011   SMALL BOWEL OBSTRUCTION   GLAUCOMA SURGERY  1978   HERNIA REPAIR     INCISION AND DRAINAGE OF WOUND N/A 09/13/2012   Procedure: INCISION  AND DEBRIDEMENT WOUND abdominal wall ;  Surgeon: Rolm Bookbinder, MD;  Location: [facility];  Service: General;  Laterality: N/A;  coordination with Dr Migdalia Dk    KNEE ARTHROSCOPY W/ MENISCECTOMY  06-05-2004   TEE WITHOUT CARDIOVERSION N/A 08/17/2018   Procedure: TRANSESOPHAGEAL ECHOCARDIOGRAM (TEE);  Surgeon: Sueanne Margarita, MD;  Location: [facility];  Service: Cardiovascular;  Laterality: N/A;   [place]  AGE 39   TRANSTHORACIC ECHOCARDIOGRAM  12-27-2010   EF 45-50%/ MODERATE MV REGURG. / LEFT ATRIUM MILDLY DILATED/ MILD TRICUSPID [place].   TUBAL LIGATION     VENTRAL HERNIA REPAIR  X4  LAST ONE 20 YRS AGO   ABDOMINAL --  EACH ONE IN DIFFERENT AREA   VENTRAL HERNIA REPAIR  01/29/2012   Procedure: HERNIA REPAIR VENTRAL ADULT;  Surgeon: Theodoro Kos, DO;  Location: [facility];  Service: Plastics;  Laterality: N/A;   WOUND DEBRIDEMENT  09/25/2011   Procedure: DEBRIDEMENT CLOSURE/ABDOMINAL  WOUND;  Surgeon: Theodoro Kos, DO;  Location: [facility];  Service: Plastics;  Laterality: N/A;  excision of abdominal wound with primary closure    ALLERGIES:  is allergic to [employer] tartrate]; aspirin; nsaids; and tolmetin.   MEDICATIONS:  Current Facility-Administered Medications  Medication Dose Route Frequency Provider Last Rate Last Dose   amiodarone (PACERONE) tablet 200 mg  200 mg Oral Daily Crenshaw, Denice Bors, MD       apixaban Arne [place]) tablet 5 mg  5 mg Oral BID Bonnell Public Tublu, MD   5 mg at 12/29/18 1026   busPIRone (BUSPAR) tablet 5 mg  5 mg Oral BID Bonnell Public Tublu, MD   5 mg at 12/29/18 1027   ceFAZolin (ANCEF) IVPB 1 g/50 mL premix  1 g Intravenous Q12H Regalado, Belkys A, MD       citalopram (CELEXA) tablet 20 mg  20 mg Oral Daily Bonnell Public Tublu, MD   20 mg at 12/29/18 1026   famotidine (PEPCID) tablet 20 mg  20 mg Oral QHS Bonnell Public Tublu, MD   20 mg at 12/28/18 2213   feeding supplement (BOOST / RESOURCE BREEZE) liquid 1 Container  1 Container Oral TID BM Regalado, Belkys A, MD   1 Container at 12/29/18 1559   insulin aspart (novoLOG) injection 0-9 Units  0-9 Units Subcutaneous Q4H Vashti Hey, MD       levothyroxine  (SYNTHROID) tablet 100 mcg  100 mcg Oral Q0600 Bonnell Public Tublu, MD   100 mcg at 12/29/18 1026   mirtazapine (REMERON) tablet 7.5 mg  7.5 mg Oral QHS Bonnell Public Tublu, MD   7.5 mg at 12/28/18 2212   polyethylene glycol (MIRALAX / GLYCOLAX) packet 17 g  17 g Oral Daily PRN Bonnell Public Tublu, MD       sodium bicarbonate 150 mEq in dextrose 5 % 1,000 mL infusion   Intravenous Continuous Regalado, Belkys A, MD 75 mL/hr at 12/30/18 1128      VITAL SIGNS: BP 106/75    Pulse 93    Temp 99.6 F (37.6 C) (Axillary)    Resp 20    Ht 5\' 3"  (1.6 m)    Wt 99.4 kg    SpO2 100%    BMI 38.82 kg/m  Filed Weights   12/28/18 1222 12/28/18 1309 12/30/18 0325  Weight: 88.5 kg 94.3 kg 99.4 kg    Estimated body mass index is 38.82 kg/m as calculated from the following:   Height as of this encounter: 5\' 3"  (1.6 m).   Weight as of this encounter: 99.4 kg.  LABS: CBC:    Component Value Date/Time   WBC 5.6 12/30/2018 1011   HGB 13.3 12/30/2018 1011   HGB 13.2 11/01/2018 1631   HCT 40.6 12/30/2018 1011   HCT 41.5 11/01/2018 1631   PLT PLATELET CLUMPS NOTED ON SMEAR, UNABLE TO ESTIMATE 12/30/2018 1011   PLT 199 11/01/2018 1631   Comprehensive Metabolic Panel:    Component Value Date/Time   NA 139 12/30/2018 1011   NA 139 12/01/2018 1329   K 4.1 12/30/2018 1011   CO2 19 (L) 12/30/2018 1011   BUN 62 (H) 12/30/2018 1011   BUN 30 (H) 12/01/2018 1329   CREATININE 4.02 (H) 12/30/2018 1011   CREATININE 0.95 (H) 04/23/2016 1129   ALBUMIN 2.6 (L) 12/30/2018 1011   ALBUMIN 4.0 11/01/2018 1631     Review of Systems  Unable to perform ROS: Acuity of condition   Physical Exam General:  lethargic, NAD,  Chronically-ill-appearing, obese   Cardiovascular: irregular rate and rhythm Pulmonary: clear ant fields, diminished bases Abdomen: soft, nontender, + bowel sounds Extremities: no edema, no joint deformities Skin: no rashes, scattered bruising  Neurological: lethargic,  easily aroused, follows commands    Prognosis: Guarded to Poor in the setting of depression, a-fib, hypotension, combined CHF (EF 20-25% previous in March 2020 40-45%), acute on chronic stage III kidney disease, dehydration, lethargy, generalized weakness, poor po intake.    Discharge Planning:  To Be Determined  Recommendations:  Full Code-as requested by son  Continue with current plan of care per medical team  Son requesting watchful waiting. Remains hopeful but preparing for the worst. Approved to be at the bedside for support and decision making given patient's inability to make decisions at this time.   Son requesting follow-up meeting tomorrow 7/17 @ 0900. Will meet him at the bedside.   PMT will continue to support and follow.    Palliative Performance Scale: NPO              Son, Teresa [name] expressed understanding and was in agreement with this plan.   Thank you for allowing the Palliative Medicine Team to assist in the care of this patient.  Time In: 1335 Time Out: 1445 Time Total: 70 min.   Visit consisted of counseling and education dealing with the complex and emotionally intense issues of symptom management and palliative care in the setting of serious and potentially life-threatening illness.Greater than 50%  of this time was spent counseling and coordinating care related to the above assessment and plan.  Signed by:  Alda Lea, AGPCNP-BC Palliative Medicine Team  Phone: [phone] Fax: [phone] Pager: [phone] Amion: Bjorn Pippin

## 2018-12-30 NOTE — Progress Notes (Signed)
Talked with son at this time and gave him an update on mom's condition.

## 2018-12-30 NOTE — Progress Notes (Signed)
Pt less responsive than she was at the beginning of the night. Grunts at times when I ask her questions but doesn't open eyes. Pt keeps slumping upper body and head over to the left side.

## 2018-12-30 NOTE — Plan of Care (Signed)
  Problem: Clinical Measurements: Goal: Ability to maintain clinical measurements within normal limits will improve Outcome: Progressing Goal: Will remain free from infection Outcome: Progressing Goal: Respiratory complications will improve Outcome: Progressing   Problem: Health Behavior/Discharge Planning: needs reorientation and reinforcement Goal: Ability to manage health-related needs will improve Outcome: Not Progressing   Problem: Clinical Measurements: continue to monitor labs(latic acid etc) for stabilization Goal: Diagnostic test results will improve Outcome: Not Progressing   Problem: Clinical Measurements: ongoing assess,ent of clinical status Goal: Ability to maintain clinical measurements within normal limits will improve Outcome: Progressing Goal: Will remain free from infection Outcome: Progressing Goal: Respiratory complications will improve Outcome: Progressing

## 2018-12-30 NOTE — TOC Initial Note (Signed)
Transition of Care [facility]) - Initial/Assessment Note    Patient Details  Name: Teresa [name] MRN: [id number] Date of Birth: 08/12/1940  [facility] CM/SW Contact:    Zenon Mayo, RN Phone Number: 12/30/2018, 8:55 AM  Clinical Narrative:                 From home with sone, she is active with [facility] for RN, PT, she has a walker and bsc.  Will need resumption orders at dc if appropriate. TOC NCM will follow for dc needs.   Expected Discharge Plan: [place] Barriers to Discharge: No Barriers Identified   Patient Goals and CMS Choice Patient states their goals for this hospitalization and ongoing recovery are:: get better CMS Medicare.gov Compare Post Acute Care list provided to:: Patient Choice offered to / list presented to : Patient  Expected Discharge Plan and Services Expected Discharge Plan: [place] In-house Referral: NA Discharge Planning Services: CM Consult Post Acute Care Choice: Home Health, Resumption of Svcs/PTA Provider Living arrangements for the past 2 months: Single Family Home                 DME Arranged: (NA)         HH Arranged: PT, RN [place] Agency: [facility] Date HH Agency Contacted: 12/29/18 Time HH Agency Contacted: 1134 Representative spoke with at [place]: Oswald Hillock  Prior Living Arrangements/Services Living arrangements for the past 2 months: [place] with:: Adult Children Patient language and need for interpreter reviewed:: Yes Do you feel safe going back to the place where you live?: Yes      Need for Family Participation in Patient Care: Yes (Comment) Care giver support system in place?: Yes (comment) Current home services: Home RN, Home PT Criminal Activity/Legal Involvement Pertinent to Current Situation/Hospitalization: No - Comment as needed  Activities of Daily Living      Permission Sought/Granted                  Emotional  Assessment Appearance:: Appears stated age Attitude/Demeanor/Rapport: (appropriate) Affect (typically observed): Appropriate Orientation: : Oriented to Self, Oriented to Place, Oriented to  Time, Oriented to Situation Alcohol / Substance Use: Not Applicable Psych Involvement: No (comment)  Admission diagnosis:  Hyperkalemia [E87.5] Abnormal EKG [R94.31] Acute renal failure, unspecified acute renal failure type [facility]) [N17.9] Patient Active Problem List   Diagnosis Date Noted  . Sepsis ([place]) 12/28/2018  . Transaminitis 10/15/2018  . Thrombocytopenia ([place]) 10/15/2018  . Atrial fibrillation ([place]) 10/13/2018  . Acute on chronic congestive heart failure ([place])   . AKI (acute kidney injury) ([place])   . Hypomagnesemia   . Pressure injury of skin 08/12/2018  . Acute renal failure (ARF) ([place]) 08/10/2018  . Hypotension due to hypovolemia 08/10/2018  . Hypokalemia 08/10/2018  . Hyponatremia 08/10/2018  . Metabolic acidosis, increased anion gap 08/10/2018  . Hypocalcemia 08/10/2018  . Elevated troponin 08/10/2018  . Prolonged QT interval 08/10/2018  . Atrial fibrillation with RVR ([place]) 08/10/2018  . Pancreatic pseudocyst 11/09/2017  . Polymyalgia rheumatica ([place]) 10/12/2016  . Anxiety and depression 10/12/2016  . Bilateral hip pain 06/24/2016  . Chronic pain of both shoulders 06/24/2016  . Psychophysiological insomnia 05/12/2016  . Spinal stenosis   . History of glaucoma   . History of small bowel obstruction   . Peptic ulcer disease   . Chronic systolic CHF (congestive heart failure), NYHA class 2 ([place])   .  Moderate mitral regurgitation   . Moderate tricuspid regurgitation   . H/O hiatal hernia   . Urge urinary incontinence   . Left ovarian cyst   . Diabetes mellitus type 2, diet-controlled ([place])   . Shortness of breath   . Wears dentures   . Hypothyroidism   . Generalized anxiety disorder 09/15/2013  . Hernia, incisional 05/10/2012  . Abdominal wall hernia 02/10/2012  .  Dyslipidemia 05/01/2011  . GERD (gastroesophageal reflux disease) 05/01/2011  . Osteoarthritis 05/01/2011  . Chronic renal insufficiency 05/01/2011  . HLD (hyperlipidemia) 05/01/2011  . SBO (small bowel obstruction), s/p expl lap 04/24/2011  . Nonischemic cardiomyopathy ([place])   . Arthritis of ankle or foot, degenerative 07/12/2010  . Arthritis of hand, degenerative 07/12/2010  . Malaise and fatigue 07/12/2010  . Essential (primary) hypertension 07/12/2010  . Glaucoma 07/12/2010   PCP:  [name], MD Pharmacy:   [facility], [place] [address] Phone: [phone] Fax: [phone]     Social Determinants of Health (SDOH) Interventions    Readmission Risk Interventions Readmission Risk Prevention Plan 10/18/2018 10/15/2018  Transportation Screening - Complete  HRI or [place] - Complete  Social Work Consult for [place] Planning/Counseling Complete -  Palliative Care Screening - Not Applicable  Medication Review Press photographer) - Complete  Some recent data might be hidden

## 2018-12-30 NOTE — Progress Notes (Signed)
Pt more alert at times. Pt stated clearly I have to pee.Explained to her she had a catheter and to go ahead and pee. Pt hasn't much urinary output tonight and has fluid type edema in BUE. Talked with pt about her grandson Eli and dog Sugar and how they missed her and wanted her to come home. Pt opened eyes briefly. Son [name] requested staff tell her about Sugar, Eli, and son [name] wanting her to get better soon and come home because they miss her.

## 2018-12-30 NOTE — Progress Notes (Signed)
Talked with son for 5 min and gave condition update

## 2018-12-30 NOTE — Progress Notes (Signed)
Pt received lactulose enema.  Shortly after insertion of enema pt had bowel movement mostly liquid dark brown and blood in stool and some just liquid blood. Pt up to bsc while getting cleaned up and pt had another dark brown close to black stool with mostly dark red and some light red blood. MD notified and orders given to monitor. Passed along to night shift RN at shift change.

## 2018-12-31 ENCOUNTER — Inpatient Hospital Stay (HOSPITAL_COMMUNITY): Payer: Medicare Other

## 2018-12-31 DIAGNOSIS — R0902 Hypoxemia: Secondary | ICD-10-CM

## 2018-12-31 DIAGNOSIS — E875 Hyperkalemia: Secondary | ICD-10-CM

## 2018-12-31 LAB — CBC
HCT: 43.9 % (ref 36.0–46.0)
Hemoglobin: 14.2 g/dL (ref 12.0–15.0)
MCH: 30.8 pg (ref 26.0–34.0)
MCHC: 32.3 g/dL (ref 30.0–36.0)
MCV: 95.2 fL (ref 80.0–100.0)
Platelets: DECREASED 10*3/uL (ref 150–400)
RBC: 4.61 MIL/uL (ref 3.87–5.11)
RDW: 15.8 % — ABNORMAL HIGH (ref 11.5–15.5)
WBC: 6.3 10*3/uL (ref 4.0–10.5)
nRBC: 0 % (ref 0.0–0.2)

## 2018-12-31 LAB — BASIC METABOLIC PANEL
Anion gap: 14 (ref 5–15)
BUN: 56 mg/dL — ABNORMAL HIGH (ref 8–23)
CO2: 23 mmol/L (ref 22–32)
Calcium: 7.9 mg/dL — ABNORMAL LOW (ref 8.9–10.3)
Chloride: 103 mmol/L (ref 98–111)
Creatinine, Ser: 3.11 mg/dL — ABNORMAL HIGH (ref 0.44–1.00)
GFR calc Af Amer: 16 mL/min — ABNORMAL LOW (ref >60)
GFR calc non Af Amer: 14 mL/min — ABNORMAL LOW (ref >60)
Glucose, Bld: 101 mg/dL — ABNORMAL HIGH (ref 70–99)
Potassium: 3.7 mmol/L (ref 3.5–5.1)
Sodium: 140 mmol/L (ref 135–145)

## 2018-12-31 LAB — CULTURE, BLOOD (ROUTINE X 2): Special Requests: ADEQUATE

## 2018-12-31 LAB — AMMONIA: Ammonia: 36 umol/L — ABNORMAL HIGH (ref 9–35)

## 2018-12-31 LAB — GLUCOSE, CAPILLARY
Glucose-Capillary: 104 mg/dL — ABNORMAL HIGH (ref 70–99)
Glucose-Capillary: 110 mg/dL — ABNORMAL HIGH (ref 70–99)
Glucose-Capillary: 112 mg/dL — ABNORMAL HIGH (ref 70–99)
Glucose-Capillary: 115 mg/dL — ABNORMAL HIGH (ref 70–99)
Glucose-Capillary: 119 mg/dL — ABNORMAL HIGH (ref 70–99)
Glucose-Capillary: 123 mg/dL — ABNORMAL HIGH (ref 70–99)

## 2018-12-31 LAB — HEPATIC FUNCTION PANEL
ALT: 294 U/L — ABNORMAL HIGH (ref 0–44)
AST: 268 U/L — ABNORMAL HIGH (ref 15–41)
Albumin: 2.6 g/dL — ABNORMAL LOW (ref 3.5–5.0)
Alkaline Phosphatase: 142 U/L — ABNORMAL HIGH (ref 38–126)
Bilirubin, Direct: 0.8 mg/dL — ABNORMAL HIGH (ref 0.0–0.2)
Indirect Bilirubin: 1.1 mg/dL — ABNORMAL HIGH (ref 0.3–0.9)
Total Bilirubin: 1.9 mg/dL — ABNORMAL HIGH (ref 0.3–1.2)
Total Protein: 4.9 g/dL — ABNORMAL LOW (ref 6.5–8.1)

## 2018-12-31 LAB — HEMOGLOBIN AND HEMATOCRIT, BLOOD
HCT: 41.9 % (ref 36.0–46.0)
Hemoglobin: 14.1 g/dL (ref 12.0–15.0)

## 2018-12-31 MED ORDER — ENSURE ENLIVE PO LIQD
237.0000 mL | Freq: Two times a day (BID) | ORAL | Status: DC
  Administered 2019-01-01 (×2): 237 mL via ORAL

## 2018-12-31 MED ORDER — SODIUM CHLORIDE 0.9 % IV SOLN
INTRAVENOUS | Status: DC
  Administered 2018-12-31: 23:00:00 via INTRAVENOUS

## 2018-12-31 MED ORDER — LACTULOSE 10 GM/15ML PO SOLN
20.0000 g | Freq: Two times a day (BID) | ORAL | Status: DC
  Administered 2018-12-31: 20 g via ORAL
  Filled 2018-12-31: qty 30

## 2018-12-31 MED ORDER — APIXABAN 5 MG PO TABS
5.0000 mg | ORAL_TABLET | Freq: Two times a day (BID) | ORAL | Status: DC
  Administered 2018-12-31 – 2019-01-13 (×27): 5 mg via ORAL
  Filled 2018-12-31 (×27): qty 1

## 2018-12-31 NOTE — Care Management Important Message (Signed)
Important Message  Patient Details  Name: Teresa [name] MRN: [id number] Date of Birth: 1941/04/15   Medicare Important Message Given:  Yes     [name] 12/31/2018, 2:01 PM

## 2018-12-31 NOTE — Evaluation (Signed)
Clinical/Bedside Swallow Evaluation Patient Details  Name: Teresa [name] MRN: [id number] Date of Birth: 09/14/1940  Today's Date: 12/31/2018 Time: SLP Start Time (ACUTE ONLY): 51 SLP Stop Time (ACUTE ONLY): 1125 SLP Time Calculation (min) (ACUTE ONLY): 23 min  Past Medical History:  Past Medical History:  Diagnosis Date  . Abdominal pain   . Anxiety    maintained on Xanax tid for years.  . Arthritis    FEET, KNEES, HANDS  . Atrial fibrillation ([place]) 09/2018  . Chronic systolic CHF (congestive heart failure), NYHA class 2 ([place])    EF 25% 2011 MV,  50-55% echo 6/13  . Diabetes mellitus type 2, diet-controlled ([place])   . GERD (gastroesophageal reflux disease)   . Glaucoma    s/p surgery B in 30s.  [place].  . H/O hiatal hernia   . History of glaucoma   . History of small bowel obstruction 2009 AND NOV 2012  . HTN (hypertension)   . Hypothyroidism   . Left ovarian cyst   . Moderate mitral regurgitation   . Moderate tricuspid regurgitation   . Non-healing surgical wound    ABDOMINAL S/P EXPLORATOY LAPAROTOMY 04-18-2011  . Nonischemic cardiomyopathy ([place]) [facility]    EF is 25% per echo February 2012, non ischemic myoview 12/2009.  EF 50% echo 7/12 and plans for ICD cancelled.   . Peptic ulcer disease   . PVC (premature ventricular contraction)   . Spinal stenosis   . Urge urinary incontinence   . Valvular heart disease    Moderate to severe MR, moderate TR, LAE per echo 7/11  . Wears dentures    upper denture-lower partial   Past Surgical History:  Past Surgical History:  Procedure Laterality Date  . ABDOMINAL HERNIA REPAIR  32 YRS AGO   PERITINITIS  . APPENDECTOMY  1979   RUPTURED  . BENIGN RIGHT BREAST TUMOR REMOVED    . CARDIOVASCULAR STRESS TEST  JULY 2011-  DR HOCHREIN   NO ISCHEMIA  . CARDIOVERSION N/A 08/17/2018   Procedure: CARDIOVERSION;  Surgeon: Sueanne Margarita, MD;  [facility] END[facility]COPY;  Service: Cardiovascular;  Laterality: N/A;  . EXPLORATOMY LAP. /  EXTENSIVE LYSIS ADHESIONS/ SMALL BOWEL RESECTION X2 WITH PRIMARY ANASTOMOSIS X2  04-18-2011   SMALL BOWEL OBSTRUCTION  . GLAUCOMA SURGERY  1978  . HERNIA REPAIR    . INCISION AND DRAINAGE OF WOUND N/A 09/13/2012   Procedure: INCISION  AND DEBRIDEMENT WOUND abdominal wall ;  Surgeon: Rolm Bookbinder, MD;  Location: [place];  Service: General;  Laterality: N/A;  coordination with Dr Migdalia Dk   . KNEE ARTHROSCOPY W/ MENISCECTOMY  06-05-2004  . TEE WITHOUT CARDIOVERSION N/A 08/17/2018   Procedure: TRANSESOPHAGEAL ECHOCARDIOGRAM (TEE);  Surgeon: Sueanne Margarita, MD;  Location: [facility] ENDOSCOPY;  Service: Cardiovascular;  Laterality: N/A;  . THYROIDECTOMY  1978 GOITOR   AND CERVICAL COLD KNIFE CONE BX  . TONSILLECTOMY  AGE 27  . TRANSTHORACIC ECHOCARDIOGRAM  12-27-2010   EF 45-50%/ MODERATE MV REGURG. / LEFT ATRIUM MILDLY DILATED/ MILD TRICUSPID REGURG.  . TUBAL LIGATION    . VENTRAL HERNIA REPAIR  X4  LAST ONE 20 YRS AGO   ABDOMINAL --  EACH ONE IN DIFFERENT AREA  . VENTRAL HERNIA REPAIR  01/29/2012   Procedure: HERNIA REPAIR VENTRAL ADULT;  Surgeon: Theodoro Kos, DO;  Location: [facility];  Service: Plastics;  Laterality: N/A;  . WOUND DEBRIDEMENT  09/25/2011   Procedure: DEBRIDEMENT CLOSURE/ABDOMINAL WOUND;  Surgeon: Theodoro Kos, DO;  Location: [place];  Service: Plastics;  Laterality: N/A;  excision of abdominal wound with primary closure   HPI:  [name], 78y/f, presented to ED very weak, unable to walk and eating very little, hypotensive and hypoglycemic. PMH significant for hypertension, diabetes, CAD, A-fib and depression.    Assessment / Plan / Recommendation Clinical Impression  Patient's son present for evalaution. He reports patient choked/coughed while drinking water yesterday. He also reports she is much more alert and "back to herself" today.  SLP Visit Diagnosis: Dysphagia, unspecified (R13.10)    Aspiration Risk  Mild aspiration risk     Diet Recommendation Dysphagia 3 (Mech soft);Thin liquid   Liquid Administration via: Straw;Cup Medication Administration: Whole meds with puree Supervision: Patient able to self feed Compensations: Slow rate;Small sips/bites Postural Changes: Seated upright at 90 degrees    Other  Recommendations Oral Care Recommendations: Oral care BID   Follow up Recommendations None        Swallow Study   General Date of Onset: 12/28/18 HPI: [name], 78y/f, presented to ED very weak, unable to walk and eating very little, hypotensive and hypoglycemic. PMH significant for hypertension, diabetes, CAD, A-fib and depression.  Type of Study: Bedside Swallow Evaluation Previous Swallow Assessment: none Diet Prior to this Study: NPO Temperature Spikes Noted: No Respiratory Status: Room air History of Recent Intubation: No Behavior/Cognition: Alert;Cooperative Oral Cavity Assessment: Within Functional Limits Oral Care Completed by SLP: Yes Oral Cavity - Dentition: Dentures, top;Missing dentition Vision: Functional for self-feeding Self-Feeding Abilities: Able to feed self Patient Positioning: Upright in chair Baseline Vocal Quality: Normal Volitional Cough: Strong Volitional Swallow: Able to elicit    Oral/Motor/Sensory Function Overall Oral Motor/Sensory Function: Within functional limits   Ice Chips Ice chips: Within functional limits Presentation: Spoon   Thin Liquid Thin Liquid: Within functional limits Presentation: Cup;Straw    Nectar Thick Nectar Thick Liquid: Not tested   Honey Thick Honey Thick Liquid: Not tested   Puree Puree: Within functional limits Presentation: Self Fed   Solid     Solid: Within functional limits Presentation: [place], MA, CCC-SLP 12/31/2018 11:33 AM

## 2018-12-31 NOTE — Progress Notes (Signed)
PROGRESS NOTE    Teresa [name]  KKX:[id number] DOB: 1941/02/23 DOA: 12/28/2018 PCP: [name], MD   Brief Narrative: 78 year old with past medical history significant for hypertension, diabetes, coronary artery disease, heart failure reduced ejection fraction, A. fib, depression who was brought in because worsening weakness.  History obtained from the son: Per son patient has not been the same since July 4 weekend when patient's daughter died from congestive heart failure.  Patient has not been drinking or eating well. Patient presented with hypoglycemia, hypotensive, acute on chronic renal failure, lactic acidosis with a lactic level at 6.7.  The morning of July 15 patient noted to be more lethargic, worsening acidosis with a bicarb at 11, pH of 7.2.  CCM consulted for further evaluation.    Assessment & Plan:   Principal Problem:   Hypotension due to hypovolemia Active Problems:   Nonischemic cardiomyopathy (HCC)   Malaise and fatigue   Essential (primary) hypertension   Chronic systolic CHF (congestive heart failure), NYHA class 2 (HCC)   Moderate mitral regurgitation   Diabetes mellitus type 2, diet-controlled (HCC)   Hypothyroidism   Anxiety and depression   Acute renal failure (ARF) (HCC)   Atrial fibrillation (HCC)   Transaminitis  1-Hypotension; Concern for sepsis, versus cardiogenic shock.  Patient presented with lactic acidosis: Lactic acid at 6. Blood pressure has improved with IV fluids. 1 out of 4 blood cultures growing staph coagulase-negative.  Repeated blood culture 7-15 no growth to date. Will finished 5 days of antibiotics.  Echocardiogram EF 25 %.  Cardiology consulted for further evaluation of cardiogenic component. Cardiology think hypotension related to hypovolemia.  Plan to continue with IV fluids.   2-Metabolic acidosis: This could be related to renal failure, versus sepsis. Started IV bicarb drip. Resolved on bicarb gtt. Change fluids to NS>    3-Acute on chronic renal failure: stage III, prior cr 1.8 Suspect multifactorial to hypovolemia, hypotension. Continue with IV fluids. Cr trending down to 3.  Monitor urine output. Avoid hypotension. Check renal US.    4-Acute metabolic encephalopathy: and hepatic encephalopathy.  Suspect multifactorial related to acidosis, hypotension. ABG with a pH of 7.2, bicarb at 11. CCM consulted for evaluation for ICU admission. No need to transfer to ICU per CCM Ammonia elevated at 90---decreased to 36.  Continue with oral lactulose.   5-1 out of 4 blood cultures positive for staph coagulase-negative: Due to presentation, lactic acidosis, hypotension.  We will continue with IV antibiotics for now. Repeated blood culture no growth to date.  On Ancef. Will finished 5 days.   6-Hyperkalemia: Lokelma ordered. Started bicarb drip. Received calcium gluconate. Resolved.   7-A. fib: Continue with amiodarone. Continue with Eliquis. Per nurse some possible blood after lactulose enema.  No further blood in stool. Hb stable at 14.Marland Kitchen  Resume eliquis.   8-Transaminases: Could be related to shock liver, hypotension. [place] with hepatic steatosis.  Trending down.   9-Hypomagnesemia: Replete IV.  10-HFrEF; Due to hypotension and lactic acidosis cardiology has been consulted for further evaluation of cardiogenic component.  11-Hypothyroidism: Continue with Synthroid.  12-Hypoglycemia: Due to poor oral intake continue with IV fluids   Estimated body mass index is 38.86 kg/m as calculated from the following:   Height as of this encounter: 5\' 3"  (1.6 m).   Weight as of this encounter: 99.5 kg.   DVT prophylaxis: Eliquis Code Status: Full code  family Communication: Disposition Plan: Remain in the hospital for treatment of hypotension, electrolyte abnormalities, renal failure.  Consultants:   CCM  Cardiology  Procedures:   Ultrasound pending  Antimicrobials:  Vancomycin 7/14 Zosyn  7/15   Subjective: She is more alert, answering question. She is hard of hearing. Son at bedside.  She is asking for water.   Objective: Vitals:   12/31/18 0658 12/31/18 0800 12/31/18 0844 12/31/18 1236  BP:   (!) 116/59 101/79  Pulse:   97 (!) 113  Resp:   (!) 32 19  Temp:   98.8 F (37.1 C) (!) 97.4 F (36.3 C)  TempSrc:   Oral Axillary  SpO2:  100% 97%   Weight: 99.5 kg     Height:        Intake/Output Summary (Last 24 hours) at 12/31/2018 1411 Last data filed at 12/31/2018 1300 Gross per 24 hour  Intake 480 ml  Output -  Net 480 ml   Filed Weights   12/28/18 1309 12/30/18 0325 12/31/18 0658  Weight: 94.3 kg 99.4 kg 99.5 kg    Examination:  General exam: Alert, following command Respiratory system: CTA Cardiovascular system: S 1, S 2 IRR Gastrointestinal system: BS present, soft, nt Central nervous system: alert, following command.  Extremities: trace edema Skin: no rashes.   Data Reviewed: I have personally reviewed following labs and imaging studies  CBC: Recent Labs  Lab 12/28/18 1000 12/28/18 1642 12/29/18 0612 12/30/18 1011 12/30/18 2317 12/31/18 0507  WBC 9.3 10.6* 6.9 5.6  --  6.3  NEUTROABS  --  7.6  --   --   --   --   HGB 16.1* 14.6 14.9 13.3 14.1 14.2  HCT 51.9* 46.5* 47.2* 40.6 41.9 43.9  MCV 97.4 98.1 97.3 93.3  --  95.2  PLT 213 179 162 PLATELET CLUMPS NOTED ON SMEAR, UNABLE TO ESTIMATE  --  PLATELET CLUMPS NOTED ON SMEAR, COUNT APPEARS DECREASED   Basic Metabolic Panel: Recent Labs  Lab 12/29/18 0612 12/29/18 0811 12/29/18 1012 12/29/18 1845 12/30/18 1011 12/31/18 0507  NA  --  135 137 136 139 140  K  --  6.4* 5.2* 5.1 4.1 3.7  CL  --  110 107 108 104 103  CO2  --  11* 14* 15* 19* 23  GLUCOSE  --  88 79 140* 116* 101*  BUN  --  59* 59* 61* 62* 56*  CREATININE  --  3.86* 3.98* 3.86* 4.02* 3.11*  CALCIUM  --  7.9* 8.3* 8.1* 8.2* 7.9*  MG 1.9  --   --   --  1.9  --    GFR: Estimated Creatinine Clearance: 16.8 mL/min (A)  (by C-G formula based on SCr of 3.11 mg/dL (H)). Liver Function Tests: Recent Labs  Lab 12/28/18 1000 12/30/18 1011 12/31/18 1127  AST 405* 357* 268*  ALT 287* 317* 294*  ALKPHOS 269* 151* 142*  BILITOT 2.9* 2.1* 1.9*  PROT 6.6 4.7* 4.9*  ALBUMIN 3.9 2.6* 2.6*   No results for input(s): LIPASE, AMYLASE in the last 168 hours. Recent Labs  Lab 12/30/18 1420 12/31/18 0507  AMMONIA 91* 36*   Coagulation Profile: No results for input(s): INR, PROTIME in the last 168 hours. Cardiac Enzymes: No results for input(s): CKTOTAL, CKMB, CKMBINDEX, TROPONINI in the last 168 hours. BNP (last 3 results) No results for input(s): PROBNP in the last 8760 hours. HbA1C: No results for input(s): HGBA1C in the last 72 hours. CBG: Recent Labs  Lab 12/30/18 1915 12/30/18 2333 12/31/18 0429 12/31/18 0746 12/31/18 1234  GLUCAP 127* 123* 115* 112* 119*  Lipid Profile: No results for input(s): CHOL, HDL, LDLCALC, TRIG, CHOLHDL, LDLDIRECT in the last 72 hours. Thyroid Function Tests: No results for input(s): TSH, T4TOTAL, FREET4, T3FREE, THYROIDAB in the last 72 hours. Anemia Panel: Recent Labs    12/28/18 1421  FERRITIN 77   Sepsis Labs: Recent Labs  Lab 12/28/18 1002 12/28/18 1421 12/28/18 1521 12/29/18 0612 12/29/18 0811 12/29/18 1012 12/30/18 0621  PROCALCITON  --  0.57  --  0.62  --   --  0.59  LATICACIDVEN 6.9*  --  4.9*  --  2.7* 2.6*  --     Recent Results (from the past 240 hour(s))  Blood culture (routine x 2)     Status: Abnormal   Collection Time: 12/28/18 10:01 AM   Specimen: BLOOD  Result Value Ref Range Status   Specimen Description BLOOD LEFT ANTECUBITAL  Final   Special Requests   Final    BOTTLES DRAWN AEROBIC AND ANAEROBIC Blood Culture adequate volume   Culture  Setup Time   Final    GRAM POSITIVE COCCI ANAEROBIC BOTTLE ONLY CRITICAL RESULT CALLED TO, READ BACK BY AND VERIFIED WITH: PHARMD V BRYK [id number] MLM    Culture (A)  Final     STAPHYLOCOCCUS SPECIES (COAGULASE NEGATIVE) THE SIGNIFICANCE OF ISOLATING THIS ORGANISM FROM A SINGLE SET OF BLOOD CULTURES WHEN MULTIPLE SETS ARE DRAWN IS UNCERTAIN. PLEASE NOTIFY THE MICROBIOLOGY DEPARTMENT WITHIN ONE WEEK IF SPECIATION AND SENSITIVITIES ARE REQUIRED. Performed at [facility], [place] [address] [place], [place] [address]    Report Status 12/31/2018 FINAL  Final  Blood Culture ID Panel (Reflexed)     Status: Abnormal   Collection Time: 12/28/18 10:01 AM  Result Value Ref Range Status   Enterococcus species NOT DETECTED NOT DETECTED Final   Listeria monocytogenes NOT DETECTED NOT DETECTED Final   Staphylococcus species DETECTED (A) NOT DETECTED Final    Comment: Methicillin (oxacillin) susceptible coagulase negative staphylococcus. Possible blood culture contaminant (unless isolated from more than one blood culture draw or clinical case suggests pathogenicity). No antibiotic treatment is indicated for blood  culture contaminants. CRITICAL RESULT CALLED TO, READ BACK BY AND VERIFIED WITH: PHARMD V BRYK [id number] MLM    Staphylococcus aureus (BCID) NOT DETECTED NOT DETECTED Final   Methicillin resistance NOT DETECTED NOT DETECTED Final   Streptococcus species NOT DETECTED NOT DETECTED Final   Streptococcus agalactiae NOT DETECTED NOT DETECTED Final   Streptococcus pneumoniae NOT DETECTED NOT DETECTED Final   Streptococcus pyogenes NOT DETECTED NOT DETECTED Final   Acinetobacter baumannii NOT DETECTED NOT DETECTED Final   Enterobacteriaceae species NOT DETECTED NOT DETECTED Final   Enterobacter cloacae complex NOT DETECTED NOT DETECTED Final   Escherichia coli NOT DETECTED NOT DETECTED Final   Klebsiella oxytoca NOT DETECTED NOT DETECTED Final   Klebsiella pneumoniae NOT DETECTED NOT DETECTED Final   Proteus species NOT DETECTED NOT DETECTED Final   Serratia marcescens NOT DETECTED NOT DETECTED Final   Haemophilus influenzae NOT DETECTED NOT DETECTED Final    Neisseria meningitidis NOT DETECTED NOT DETECTED Final   Pseudomonas aeruginosa NOT DETECTED NOT DETECTED Final   Candida albicans NOT DETECTED NOT DETECTED Final   Candida glabrata NOT DETECTED NOT DETECTED Final   Candida krusei NOT DETECTED NOT DETECTED Final   Candida parapsilosis NOT DETECTED NOT DETECTED Final   Candida tropicalis NOT DETECTED NOT DETECTED Final    Comment: Performed at [facility], [address] [place], [place] [address]  Blood culture (  routine x 2)     Status: None (Preliminary result)   Collection Time: 12/28/18 10:06 AM   Specimen: BLOOD  Result Value Ref Range Status   Specimen Description BLOOD SITE NOT SPECIFIED  Final   Special Requests   Final    BOTTLES DRAWN AEROBIC ONLY Blood Culture results may not be optimal due to an inadequate volume of blood received in culture bottles   Culture   Final    NO GROWTH 2 DAYS Performed at [facility], [place] [address] [place], [place] [address]    Report Status PENDING  Incomplete  SARS Coronavirus 2 (CEPHEID- Performed in [place] hospital lab), Hosp Order     Status: None   Collection Time: 12/28/18 10:14 AM   Specimen: Nasopharyngeal Swab  Result Value Ref Range Status   SARS Coronavirus 2 NEGATIVE NEGATIVE Final    Comment: (NOTE) If result is NEGATIVE SARS-CoV-2 target nucleic acids are NOT DETECTED. The SARS-CoV-2 RNA is generally detectable in upper and lower  respiratory specimens during the acute phase of infection. The lowest  concentration of SARS-CoV-2 viral copies this assay can detect is 250  copies / mL. A negative result does not preclude SARS-CoV-2 infection  and should not be used as the sole basis for treatment or other  patient management decisions.  A negative result may occur with  improper specimen collection / handling, submission of specimen other  than nasopharyngeal swab, presence of viral mutation(s) within the  areas targeted by this assay, and inadequate number  of viral copies  (<250 copies / mL). A negative result must be combined with clinical  observations, patient history, and epidemiological information. If result is POSITIVE SARS-CoV-2 target nucleic acids are DETECTED. The SARS-CoV-2 RNA is generally detectable in upper and lower  respiratory specimens dur ing the acute phase of infection.  Positive  results are indicative of active infection with SARS-CoV-2.  Clinical  correlation with patient history and other diagnostic information is  necessary to determine patient infection status.  Positive results do  not rule out bacterial infection or co-infection with other viruses. If result is PRESUMPTIVE POSTIVE SARS-CoV-2 nucleic acids MAY BE PRESENT.   A presumptive positive result was obtained on the submitted specimen  and confirmed on repeat testing.  While 2019 novel coronavirus  (SARS-CoV-2) nucleic acids may be present in the submitted sample  additional confirmatory testing may be necessary for epidemiological  and / or clinical management purposes  to differentiate between  SARS-CoV-2 and other Sarbecovirus currently known to infect humans.  If clinically indicated additional testing with an alternate test  methodology [phone]) is advised. The SARS-CoV-2 RNA is generally  detectable in upper and lower respiratory sp ecimens during the acute  phase of infection. The expected result is Negative. Fact Sheet for Patients:  StrictlyIdeas.no Fact Sheet for Healthcare Providers: BankingDealers.co.za This test is not yet approved or cleared by the [place] FDA and has been authorized for detection and/or diagnosis of SARS-CoV-2 by FDA under an Emergency Use Authorization (EUA).  This EUA will remain in effect (meaning this test can be used) for the duration of the COVID-19 declaration under Section 564(b)(1) of the Act, 21 U.S.C. section 360bbb-3(b)(1), unless the authorization is  terminated or revoked sooner. Performed at [facility], [place] [address], [place], [place] [address]   Urine culture     Status: Abnormal   Collection Time: 12/28/18  2:15 PM   Specimen: In/Out Cath Urine  Result  Value Ref Range Status   Specimen Description IN/OUT CATH URINE  Final   Special Requests   Final    NONE Performed at [facility], [place] [address], [place], [place] [address]    Culture 10,000 COLONIES/mL ESCHERICHIA COLI (A)  Final   Report Status 12/30/2018 FINAL  Final   Organism ID, Bacteria ESCHERICHIA COLI (A)  Final      Susceptibility   Escherichia coli - MIC*    AMPICILLIN >=32 RESISTANT Resistant     CEFAZOLIN <=4 SENSITIVE Sensitive     CEFTRIAXONE <=1 SENSITIVE Sensitive     CIPROFLOXACIN <=0.25 SENSITIVE Sensitive     GENTAMICIN <=1 SENSITIVE Sensitive     IMIPENEM <=0.25 SENSITIVE Sensitive     NITROFURANTOIN <=16 SENSITIVE Sensitive     TRIMETH/SULFA <=20 SENSITIVE Sensitive     AMPICILLIN/SULBACTAM 8 SENSITIVE Sensitive     PIP/TAZO <=4 SENSITIVE Sensitive     Extended ESBL NEGATIVE Sensitive     * 10,000 COLONIES/mL ESCHERICHIA COLI  MRSA PCR Screening     Status: None   Collection Time: 12/28/18  5:15 PM   Specimen: Nasopharyngeal  Result Value Ref Range Status   MRSA by PCR NEGATIVE NEGATIVE Final    Comment:        The GeneXpert MRSA Assay (FDA approved for NASAL specimens only), is one component of a comprehensive MRSA colonization surveillance program. It is not intended to diagnose MRSA infection nor to guide or monitor treatment for MRSA infections. Performed at [facility], [place] [address] [place], [place] [address]   Culture, blood (routine x 2)     Status: None (Preliminary result)   Collection Time: 12/29/18 10:00 AM   Specimen: BLOOD  Result Value Ref Range Status   Specimen Description BLOOD RIGHT ANTECUBITAL  Final   Special Requests   Final    BOTTLES DRAWN AEROBIC ONLY Blood Culture adequate volume    Culture   Final    NO GROWTH 1 DAY Performed at [facility], [place] [address], [place], [place] [address]    Report Status PENDING  Incomplete  Culture, blood (routine x 2)     Status: None (Preliminary result)   Collection Time: 12/29/18 10:12 AM   Specimen: BLOOD  Result Value Ref Range Status   Specimen Description BLOOD LEFT ANTECUBITAL  Final   Special Requests   Final    BOTTLES DRAWN AEROBIC ONLY Blood Culture adequate volume   Culture   Final    NO GROWTH 1 DAY Performed at [facility], [place] [address], [place], [place] [address]    Report Status PENDING  Incomplete         Radiology Studies: No results found.      Scheduled Meds: . amiodarone  200 mg Oral Daily  . apixaban  5 mg Oral BID  . busPIRone  5 mg Oral BID  . citalopram  20 mg Oral Daily  . famotidine  20 mg Oral QHS  . feeding supplement  1 Container Oral TID BM  . insulin aspart  0-9 Units Subcutaneous Q4H  . lactulose  20 g Oral BID  . lactulose  30 g Oral Once  . levothyroxine  100 mcg Oral Q0600  . mirtazapine  7.5 mg Oral QHS   Continuous Infusions: . sodium chloride    .  ceFAZolin (ANCEF) IV 1 g (12/31/18 0903)     LOS: 3 days    Time spent: 35 minutes.  Elmarie Shiley, MD [facility] Pager [phone]  If 7PM-7AM, please contact night-coverage [email or web address] Password [facility] 12/31/2018, 2:11 PM

## 2018-12-31 NOTE — Progress Notes (Signed)
Occupational Therapy Evaluation Patient Details Name: Teresa [name] MRN: [id number] DOB: 05-10-1941 Today's Date: 12/31/2018    History of Present Illness Pt adm with weakness, hypotension, acute on chronic renal failure. PMH - afib, chf, anxiety, dm, cad,    Clinical Impression   PTA, pt was living at home alone with her yorkie, Sugar, pt reports she was modified independent with ADL and required assistance from her son for IADL. Pt reports she was modified independent with functional mobility at RW level. [name] son reports pt has been deconditioning for previous 3 weeks. [name] son reports he checks in with pt breakfast, lunch, and dinner until bedtime. Pt currently requires modA+2 for sit<>stand, maxA+2 for stand-pivot for toilet transfer at RW level, and totalA to don socks. Pt and her son request pt to d/c home with 24/7 support. If pt unable to have appropriate level of support, recommend SNF d/c. Due to decline in current level of function, pt would benefit from acute OT to address established goals to facilitate safe D/C to venue listed below. Will continue to follow acutely.     Follow Up Recommendations  SNF;Home health OT;Supervision/Assistance - 24 hour    Equipment Recommendations  None recommended by OT    Recommendations for Other Services       Precautions / Restrictions Precautions Precautions: Fall Restrictions Weight Bearing Restrictions: No      Mobility Bed Mobility Overal bed mobility: Needs Assistance Bed Mobility: Supine to Sit     Supine to sit: Total assist;+2 for physical assistance     General bed mobility comments: assist for all aspects:therapist and nsg  Transfers Overall transfer level: Needs assistance Equipment used: Rolling walker (2 wheeled) Transfers: Sit to/from [facility] Sit to Stand: Mod assist;+2 physical assistance Stand pivot transfers: Max assist;+2 physical assistance       General transfer comment: stand  pivot from EOB to [facility] and from [facility] to recliner;maxA+2 for stand-pivot transfer    Balance Overall balance assessment: Needs assistance Sitting-balance support: No upper extremity supported;Feet supported Sitting balance-Leahy Scale: Fair     Standing balance support: Bilateral upper extremity supported Standing balance-Leahy Scale: Poor Standing balance comment: requires BUE support in standing                            ADL either performed or assessed with clinical judgement   ADL Overall ADL's : Needs assistance/impaired Eating/Feeding: (NPO)   Grooming: Set up;Sitting   Upper Body Bathing: Minimal assistance;Sitting   Lower Body Bathing: Total assistance;Sit to/from stand;Moderate assistance;+2 for physical assistance Lower Body Bathing Details (indicate cue type and reason): totalA to access BLE;modA+2 for sit<>stand Upper Body Dressing : Minimal assistance;Sitting   Lower Body Dressing: Sit to/from stand;Total assistance;Moderate assistance;+2 for physical assistance   Toilet Transfer: Moderate assistance;+2 for physical assistance;Stand-pivot;RW   Toileting- Clothing Manipulation and Hygiene: Total assistance;Moderate assistance;+2 for physical assistance;Sit to/from stand Toileting - Clothing Manipulation Details (indicate cue type and reason): total A for posterior pericare;modA+2 for sit<>stand     Functional mobility during ADLs: +2 for physical assistance;Rolling walker;Maximal assistance General ADL Comments: pt limited activity tolerance;requires modA+2 for sit<>stand and maxA+2 stand-pivot transfers;     Vision Baseline Vision/History: Wears glasses Wears Glasses: At all times Patient Visual Report: No change from baseline       Perception     Praxis      Pertinent Vitals/Pain Pain Assessment: No/denies pain Faces Pain Scale: No hurt  Hand Dominance Right   Extremity/Trunk Assessment Upper Extremity Assessment Upper Extremity  Assessment: Generalized weakness   Lower Extremity Assessment Lower Extremity Assessment: Generalized weakness   Cervical / Trunk Assessment Cervical / Trunk Assessment: Kyphotic   Communication Communication Communication: HOH   Cognition Arousal/Alertness: Lethargic Behavior During Therapy: WFL for tasks assessed/performed Overall Cognitive Status: Impaired/Different from baseline Area of Impairment: Following commands;Safety/judgement;Problem solving                       Following Commands: Follows multi-step commands inconsistently;Follows multi-step commands with increased time Safety/Judgement: Decreased awareness of safety;Decreased awareness of deficits   Problem Solving: Slow processing;Requires verbal cues;Requires tactile cues     General Comments  pt's son present during session provided pt PLOF information;difficulty reading SpO2 no adverse signs or symptoms during session    Exercises     Shoulder Instructions      Home Living Family/patient expects to be discharged to:: Private residence Living Arrangements: Children Available Help at Discharge: Family;Available 24 hours/day Type of Home: House Home Access: Level entry     Home Layout: One level     Bathroom Shower/Tub: Teacher, early years/pre: Standard     Home Equipment: Cane - single point;Grab bars - toilet;Toilet riser;Tub bench;Bedside commode;Walker - 2 wheels   Additional Comments: Information from prior encounter      Prior Functioning/Environment Level of Independence: Needs assistance  Gait / Transfers Assistance Needed: amb short distance with walker. Unsure if unassisted or with assist ADL's / Homemaking Assistance Needed: pt's son has been assisting with all IADL and reports pt has been modified independent in ADL   Comments: retired  from teaching pre-k. Has a yorkie named "Sugar".        OT Problem List: Decreased strength;Decreased range of  motion;Decreased activity tolerance;Impaired balance (sitting and/or standing);Decreased cognition;Decreased safety awareness;Decreased knowledge of use of DME or AE;Decreased knowledge of precautions;Cardiopulmonary status limiting activity;Obesity      OT Treatment/Interventions: Self-care/ADL training;Therapeutic exercise;Energy conservation;DME and/or AE instruction;Therapeutic activities;Cognitive remediation/compensation;Patient/family education;Balance training    OT Goals(Current goals can be found in the care plan section) Acute Rehab OT Goals Patient Stated Goal: to get back to her Yorkie, Sugar OT Goal Formulation: With patient Time For Goal Achievement: 01/14/19 Potential to Achieve Goals: Good ADL Goals Pt Will Perform Grooming: with modified independence;sitting;standing Pt Will Perform Upper Body Dressing: with modified independence;sitting;standing Pt Will Perform Lower Body Dressing: sit to/from stand;with modified independence Pt Will Transfer to Toilet: with modified independence;ambulating;bedside commode Pt Will Perform Toileting - Clothing Manipulation and hygiene: with modified independence;sit to/from stand  OT Frequency: Min 2X/week   Barriers to D/C: Decreased caregiver support  pt's son unable to be home 24/7 comes 3x/day (breakfast, lunch, dinner until bedtime)       Co-evaluation              AM-PAC OT "6 Clicks" Daily Activity     Outcome Measure Help from another person eating meals?: Total(NPO) Help from another person taking care of personal grooming?: A Little Help from another person toileting, which includes using toliet, bedpan, or urinal?: A Lot Help from another person bathing (including washing, rinsing, drying)?: A Lot Help from another person to put on and taking off regular upper body clothing?: A Little Help from another person to put on and taking off regular lower body clothing?: A Lot 6 Click Score: 13   End of Session Equipment  Utilized During Treatment: Gait belt;Rolling  walker Nurse Communication: Mobility status  Activity Tolerance: Patient tolerated treatment well;Patient limited by fatigue Patient left: in chair;with call bell/phone within reach;with chair alarm set;with family/visitor present  OT Visit Diagnosis: Unsteadiness on feet (R26.81);Other abnormalities of gait and mobility (R26.89);Muscle weakness (generalized) (M62.81);Feeding difficulties (R63.3);Other symptoms and signs involving cognitive function                Time: 8590-9311 OT Time Calculation (min): 30 min Charges:  OT General Charges $OT Visit: 1 Visit OT Evaluation $OT Eval Moderate Complexity: 1 Mod OT Treatments $Self Care/Home Management : 8-22 mins  Dorinda Hill OTR/L Acute Rehabilitation Services Office: [place] 12/31/2018, 10:41 AM

## 2018-12-31 NOTE — Progress Notes (Signed)
[place] for Apixaban Indication: atrial fibrillation  Allergies  Allergen Reactions  . Ambien [Zolpidem Tartrate] Other (See Comments)      Severe lethargy confusion and restlessness  . Aspirin Other (See Comments)    HX Bleeding Ulcer   . Nsaids Other (See Comments)    Hx of bleeding ulcers  . Tolmetin Rash    Hx of bleeding ulcers    Patient Measurements: Height: 5\' 3"  (160 cm) Weight: 219 lb 5.7 oz (99.5 kg) IBW/kg (Calculated) : 52.4  Vital Signs: Temp: 98.8 F (37.1 C) (07/17 0844) Temp Source: Oral (07/17 0844) BP: 116/59 (07/17 0844) Pulse Rate: 97 (07/17 0844)  Labs: Recent Labs    12/28/18 1220  12/29/18 0612  12/29/18 1845 12/30/18 1011 12/30/18 2317 12/31/18 0507  HGB  --    < > 14.9  --   --  13.3 14.1 14.2  HCT  --    < > 47.2*  --   --  40.6 41.9 43.9  PLT  --    < > 162  --   --  PLATELET CLUMPS NOTED ON SMEAR, UNABLE TO ESTIMATE  --  PLATELET CLUMPS NOTED ON SMEAR, COUNT APPEARS DECREASED  CREATININE  --   --   --    < > 3.86* 4.02*  --  3.11*  TROPONINIHS 31*  --   --   --   --   --   --   --    < > = values in this interval not displayed.    Estimated Creatinine Clearance: 16.8 mL/min (A) (by C-G formula based on SCr of 3.11 mg/dL (H)).   Medical History: Past Medical History:  Diagnosis Date  . Abdominal pain   . Anxiety    maintained on Xanax tid for years.  . Arthritis    FEET, KNEES, HANDS  . Atrial fibrillation ([place]) 09/2018  . Chronic systolic CHF (congestive heart failure), NYHA class 2 ([place])    EF 25% 2011 MV,  50-55% echo 6/13  . Diabetes mellitus type 2, diet-controlled ([place])   . GERD (gastroesophageal reflux disease)   . Glaucoma    s/p surgery B in 30s.  [place].  . H/O hiatal hernia   . History of glaucoma   . History of small bowel obstruction 2009 AND NOV 2012  . HTN (hypertension)   . Hypothyroidism   . Left ovarian cyst   . Moderate mitral regurgitation   . Moderate  tricuspid regurgitation   . Non-healing surgical wound    ABDOMINAL S/P EXPLORATOY LAPAROTOMY 04-18-2011  . Nonischemic cardiomyopathy ([place]) [facility]    EF is 25% per echo February 2012, non ischemic myoview 12/2009.  EF 50% echo 7/12 and plans for ICD cancelled.   . Peptic ulcer disease   . PVC (premature ventricular contraction)   . Spinal stenosis   . Urge urinary incontinence   . Valvular heart disease    Moderate to severe MR, moderate TR, LAE per echo 7/11  . Wears dentures    upper denture-lower partial    Medications:  Medications Prior to Admission  Medication Sig Dispense Refill Last Dose  . albuterol (PROAIR HFA) 108 (90 Base) MCG/ACT inhaler USE 2 PUFFS EVERY 6 HOURS AS NEEDED FOR SHORTNESS OF BREATH AND WHEEZING. 8.5 g 0 12/27/2018 at Unknown time  . amiodarone (PACERONE) 200 MG tablet Take 1 tablet (200 mg total) by mouth 2 (two) times daily. 60 tablet 2 12/27/2018 at Unknown  time  . apixaban (ELIQUIS) 5 MG TABS tablet Take 1 tablet (5 mg total) by mouth 2 (two) times daily. 60 tablet 2 12/27/2018 at 2000  . busPIRone (BUSPAR) 5 MG tablet Take 1 tablet (5 mg total) by mouth 2 (two) times daily. 180 tablet 1 12/27/2018 at Unknown time  . citalopram (CELEXA) 20 MG tablet TAKE 1 TABLET EACH DAY. (Patient taking differently: Take 20 mg by mouth daily. ) 90 tablet 1 12/27/2018 at Unknown time  . doxylamine, Sleep, (UNISOM) 25 MG tablet Take 25 mg by mouth at bedtime as needed for sleep.   12/27/2018 at Unknown time  . famotidine (PEPCID) 20 MG tablet Take 1 tablet (20 mg total) by mouth at bedtime. 90 tablet 0 Past Week at Unknown time  . HYDROcodone-acetaminophen (NORCO/VICODIN) 5-325 MG tablet Take 1 tablet by mouth 5 (five) times daily.    12/27/2018 at Unknown time  . hydrOXYzine (ATARAX/VISTARIL) 10 MG tablet TAKE 1 TABLET TWICE DAILY AS NEEDED FOR ANXIETY. (Patient taking differently: Take 10 mg by mouth 2 (two) times daily as needed for anxiety. ) 30 tablet 0 12/27/2018 at Unknown  time  . levothyroxine (SYNTHROID) 100 MCG tablet TAKE 1 TABLET ONCE DAILY BEFORE BREAKFAST. 90 tablet 1 12/27/2018 at Unknown time  . metoprolol succinate (TOPROL-XL) 100 MG 24 hr tablet Take 1 tablet (100 mg total) by mouth daily. Take with or immediately following a meal. 30 tablet 2 12/27/2018 at 0800  . mirtazapine (REMERON) 7.5 MG tablet Take 1 tablet (7.5 mg total) by mouth at bedtime. 90 tablet 0 12/26/2018  . potassium chloride (MICRO-K) 10 MEQ CR capsule Take 1 capsule (10 mEq total) by mouth 2 (two) times daily. 60 capsule 4 12/27/2018 at Unknown time  . torsemide (DEMADEX) 20 MG tablet Take 2 tablets (40 mg total) by mouth daily. 60 tablet 2 12/27/2018 at 0800  . Spacer/Aero-Holding Chambers (AEROCHAMBER MV) inhaler Use as instructed 1 each 0     Assessment:  78 yo F presenting with generalized weakness and edema x 3 days, constipation, cannot stand.  Pt started on IV abx for possible sepsis and IVF for AKI.  SCr improving 4.82>>3.86 >> 3.11 (baseline around 1.8).   Pt was on apixaban PTA for hx afib.  Doses were held 7/15 PM and 7/16 due ot NPO status and reported blood with BM following lactulose enema.  MD has asked that apixaban be restarted today.  Of note, pt only meets 1 of the 2 required criteria for reduced dose (age >80, wt <60kg, or SCr >1.5) and therefore still qualifies for full dose.  Goal of Therapy:  therapeutic anticaogulation Monitor platelets by anticoagulation protocol: Yes   Plan:  Restart Apixaban 5mg  PO BID Follow up CBC and  s/sx bleeding  [employer], Pharm.D., BCPS Clinical Pharmacist Clinical phone for 12/31/2018 from 8:30-4:00 is [phone].  **Pharmacist phone directory can now be found on [email or web address] ([email or web address]).  Listed under [place].  12/31/2018 11:11 AM

## 2018-12-31 NOTE — Progress Notes (Addendum)
Daily Progress Note   Patient Name: Teresa [name]       Date: 12/31/2018 DOB: [birth year]  Age: 78 y.o. MRN#: [id number] Attending Physician: Elmarie Shiley, MD Primary Care Physician: [name], MD Admit Date: 12/28/2018  Reason for Consultation/Follow-up: Establishing goals of care  Subjective: Patient lying in bed. Much more awake and alert today. She is able to answer all identifying questions appropriately. Son, [name] at the bedside for requested follow up meeting. Patient denies pain or shortness of breath. Continues to ask about having some water and mouth being dry. Oral care provided and after assisting in an upright position 2 ice chips were given. She tolerated without complications. Explained to her that she could not have any water or food until she is evaluated by SLP for swallowing safety. She verbalized understanding and appreciation. Hard of hearing (left ear is the best).   Son verbalized his happiness with his mother being much more awake, alert, and engaged in conversations. He visited yesterday afternoon and at that time she was lethargic and would only awake to acknowledge him but otherwise not much more engagement.   Detailed updates provided to both patient and son. Dr. Tyrell Antonio at the bedside to assist with medical updates. Son verbalized understanding and continued to express his hopes of his mother improving while sharing stories of his siblings (all deceased now) and how they lived years with CHF and EF 15%.   Continued detailed discussion with patient and her son regarding her current illness, co-morbidities, poor long-term prognosis etc. Both patient and son verbalized understanding. Patient observed looking at son and holding his hand. She expressed to him "I  love you son with all my heart, it doesn't look good, and if God says it is time it will be time. I will be ok!" [name] in tears reciprocating his love and feelings to his mother. Emotional support provided. He expresses his wishes not to lose her and patient states she doesn't want to go but neither of them have control of when it is their time.   Son continues to share his experiences with his siblings and compares how patient was on "her death bed" several other times and family was told it was "the end" only for her to pull through and still be here. He shares that her  PCP felt she was not ready to be on hospice months ago and they did not want to go through that again until it was closer to time. I attempted to explain to son and patient in detail what hospice's goals and philosophy was in addition to palliatives. Son verbalized understanding and expressed he initially was led to believe his mother was going to die within 2-3 weeks before and that's why she was referred to hospice.   I discussed with patient and son her current full code status, and possibility of continuous cycle of dehydration due to lack of po intake. We discussed artificial feedings and re hospitalizations and the affects this would have on patient's health and quality of life. Patient stated she was not interested in artificial feedings such as PEG.   We discussed her current full code status in detail with consideration given to her current illness and co-morbidities. Son is tearful and continues to state she has always said she was ok with CPR and life-support as long as they try. He states he has to go along with her wishes. I encouraged him to allow for continued discussions with his mom now that she is much more awake and alert. He initially declined to continue discussion with his mother regarding her wishes, however after further discussion, I explained given she was able to answer all of my questions appropriately I felt  ethically I needed to hear from her how she felt. He agreed. Patient expressed she would not want to undergo heroic measures and/or mechanical ventilation if there was not a chance that she would come off of the machine and return back to her normal self. I asked her to share what "her normal self" meant and looked like for her. She stated if she was not in her right mind, able to get up and move around some, provide some independent care, and did not have to be on any machines to her live. I explained to her given her chronic illness their is a high likelihood that she would not return to her current baseline or liberate from ventilator. Son, is tearful. He states he feels she is still somewhat confused and I don't think she understands. He asked patient himself what she would want in an emergency situation and patient responded "I heard what she said and I gave her my answer. That is the same answer I am giving you!" Support given. I took this opportunity to ask patient how she felt about her health. She stated "I am grateful, thankful, and happy that I am feeling better. I feel like my health is not doing well, and it is bad this time!" Emotional support given. Son continued to state he did not want her to be a DNR/DNI and he still felt she did not know what she was saying. I attempted to continue discussing with her however, he did not want to continue that part of the conversation, stating she was tired and worn out. Patient looked at me and stated "it is ok!"   Son expressed he is hopeful patient can return home with home health or to rehab and later return home. He declines palliative or hospice outpatient support but verbalizes he would like for me to remain involved in her care while hospitalized. Patient agrees and expressed appreciation of medical team's care.   Length of Stay: 3  Current Medications: Scheduled Meds:  . amiodarone  200 mg Oral Daily  . apixaban  5 mg Oral BID  .  busPIRone  5 mg  Oral BID  . citalopram  20 mg Oral Daily  . famotidine  20 mg Oral QHS  . feeding supplement  1 Container Oral TID BM  . insulin aspart  0-9 Units Subcutaneous Q4H  . lactulose  20 g Oral BID  . lactulose  30 g Oral Once  . levothyroxine  100 mcg Oral Q0600  . mirtazapine  7.5 mg Oral QHS    Continuous Infusions: . sodium chloride    .  ceFAZolin (ANCEF) IV 1 g (12/31/18 0903)    PRN Meds: polyethylene glycol  Physical Exam         General: much more awake, alert, NAD, Chronically-ill-appearing, obese   Cardiovascular: irregular rate and rhythm Pulmonary: clear ant fields, diminished bases Abdomen: soft, nontender, + bowel sounds Extremities: bilateral lower edema, no joint deformities Skin: no rashes, scattered bruising  Neurological: Awake, alert & oriented, answers questions appropriately   Vital Signs: BP 101/79 (BP Location: Left Arm)   Pulse (!) 113   Temp (!) 97.4 F (36.3 C) (Axillary)   Resp 19   Ht 5\' 3"  (1.6 m)   Wt 99.5 kg   SpO2 97%   BMI 38.86 kg/m  SpO2: SpO2: 97 % O2 Device: O2 Device: Room Air O2 Flow Rate: O2 Flow Rate (L/min): 2 L/min  Intake/output summary:   Intake/Output Summary (Last 24 hours) at 12/31/2018 1441 Last data filed at 12/31/2018 1300 Gross per 24 hour  Intake 480 ml  Output -  Net 480 ml   LBM: Last BM Date: 12/25/18 Baseline Weight: Weight: 88.5 kg Most recent weight: Weight: 99.5 kg       Palliative Assessment/Data: PPS 30%      Patient Active Problem List   Diagnosis Date Noted  . Sepsis ([place]) 12/28/2018  . Transaminitis 10/15/2018  . Thrombocytopenia ([place]) 10/15/2018  . Atrial fibrillation ([place]) 10/13/2018  . Acute on chronic congestive heart failure ([place])   . AKI (acute kidney injury) ([place])   . Hypomagnesemia   . Pressure injury of skin 08/12/2018  . Acute renal failure (ARF) ([place]) 08/10/2018  . Hypotension due to hypovolemia 08/10/2018  . Hypokalemia 08/10/2018  . Hyponatremia 08/10/2018  . Metabolic  acidosis, increased anion gap 08/10/2018  . Hypocalcemia 08/10/2018  . Elevated troponin 08/10/2018  . Prolonged QT interval 08/10/2018  . Atrial fibrillation with RVR ([place]) 08/10/2018  . Pancreatic pseudocyst 11/09/2017  . Polymyalgia rheumatica ([place]) 10/12/2016  . Anxiety and depression 10/12/2016  . Bilateral hip pain 06/24/2016  . Chronic pain of both shoulders 06/24/2016  . Psychophysiological insomnia 05/12/2016  . Spinal stenosis   . History of glaucoma   . History of small bowel obstruction   . Peptic ulcer disease   . Chronic systolic CHF (congestive heart failure), NYHA class 2 ([place])   . Moderate mitral regurgitation   . Moderate tricuspid regurgitation   . H/O hiatal hernia   . Urge urinary incontinence   . Left ovarian cyst   . Diabetes mellitus type 2, diet-controlled ([place])   . Shortness of breath   . Wears dentures   . Hypothyroidism   . Generalized anxiety disorder 09/15/2013  . Hernia, incisional 05/10/2012  . Abdominal wall hernia 02/10/2012  . Dyslipidemia 05/01/2011  . GERD (gastroesophageal reflux disease) 05/01/2011  . Osteoarthritis 05/01/2011  . Chronic renal insufficiency 05/01/2011  . HLD (hyperlipidemia) 05/01/2011  . SBO (small bowel obstruction), s/p expl lap 04/24/2011  . Nonischemic cardiomyopathy ([place])   . Arthritis of  ankle or foot, degenerative 07/12/2010  . Arthritis of hand, degenerative 07/12/2010  . Malaise and fatigue 07/12/2010  . Essential (primary) hypertension 07/12/2010  . Glaucoma 07/12/2010    Palliative Care Assessment & Plan   Patient Profile: Palliative Care consult requested for this 78 y.o. female with multiple medical problems including hypertension, diabetes, arthritis, glaucoma, CAD, systolic congestive heart failure, atrial fibrillation, and depression. She presented from home with son to ED with complaints of generalized weakness, poor appetite, and difficulty walking. Son reports patient is depressed as her only  daughter passed away on 01/04/23 due to CHF complications. Since admission patient continues to be lethargic, with hypotension, lethargy, and worsening renal failure.  Palliative Medicine team to assist with goals of care discussion.   Recommendations/Plan:  Full Code-as requested by son (patient states she would not want heroic measures if she did not have current or better quality of life, no life-long machines or artificial feeding)  Continue with current plan of care per medical team  Son remains hopeful comparing previous illness and recovery to current condition.   Declined outpatient palliative or hospice due to previous experience and being told she was doing better by PCP.   PMT will continue to support and follow.   Goals of Care and Additional Recommendations:  Limitations on Scope of Treatment: Full Scope Treatment and No Artificial Feeding  Code Status:    Code Status Orders  (From admission, onward)         Start     Ordered   12/28/18 1618  Full code  Continuous     12/28/18 1617        Code Status History    Date Active Date Inactive Code Status Order ID Comments User Context   10/19/2018 1643 10/20/2018 1622 DNR [id number]  Marty Heck, DO Inpatient   10/13/2018 0949 10/19/2018 1643 Full Code [id number]  Katherine Roan, MD ED   08/10/2018 2050 08/17/2018 1715 Full Code [id number]  Vianne Bulls, MD ED   02/04/2013 2124 02/07/2013 1631 Full Code [id number]  Excell Seltzer, MD Inpatient   09/13/2012 1520 09/14/2012 1647 Full Code [id number]  Rolm Bookbinder, MD Inpatient   04/20/2011 0006 05/02/2011 1552 Full Code [id number]  Long, Gilles Chiquito, RN Inpatient   Advance Care Planning Activity      Prognosis:   Guarded to Poor   Discharge Planning:  To Be Determined  Care plan was discussed with patient, patient's son, RN, and Dr. Tyrell Antonio   Thank you for allowing the Palliative Medicine Team to assist in the care of this patient.  Time In: 0915 Time Out:  1120 Total Time: 130 min.   Greater than 50%  of this time was spent counseling and coordinating care related to the above assessment and plan.  Alda Lea, AGPCNP-BC Palliative Medicine Team  Phone: [phone] Pager: [phone] Amion: Bjorn Pippin    Please contact Palliative Medicine Team phone at [phone] for questions and concerns.

## 2018-12-31 NOTE — Progress Notes (Addendum)
Progress Note  Patient Name: Teresa [name] Date of Encounter: 12/31/2018  Primary Cardiologist: Minus Breeding, MD   Subjective   Content today. Up to chair. Breathing better. No pain   Inpatient Medications    Scheduled Meds: . amiodarone  200 mg Oral Daily  . busPIRone  5 mg Oral BID  . citalopram  20 mg Oral Daily  . famotidine  20 mg Oral QHS  . feeding supplement  1 Container Oral TID BM  . insulin aspart  0-9 Units Subcutaneous Q4H  . lactulose  20 g Oral BID  . lactulose  30 g Oral Once  . levothyroxine  100 mcg Oral Q0600  . mirtazapine  7.5 mg Oral QHS   Continuous Infusions: .  ceFAZolin (ANCEF) IV 1 g (12/31/18 0903)   PRN Meds: polyethylene glycol   Vital Signs    Vitals:   12/31/18 0431 12/31/18 0658 12/31/18 0800 12/31/18 0844  BP: (!) 94/20   (!) 116/59  Pulse: (!) 101   97  Resp: 19   (!) 32  Temp: (!) 97.4 F (36.3 C)   98.8 F (37.1 C)  TempSrc: Oral   Oral  SpO2:   100% 97%  Weight:  99.5 kg    Height:       No intake or output data in the 24 hours ending 12/31/18 1035 Filed Weights   12/28/18 1309 12/30/18 0325 12/31/18 0658  Weight: 94.3 kg 99.4 kg 99.5 kg    Physical Exam   General: Elderly,  NAD Skin: Warm, dry, intact  Neck: Negative for carotid bruits. No JVD Lungs:Clear to ausculation bilaterally. No wheezes, rales, or rhonchi. Breathing is unlabored. Cardiovascular: Irregularly irregular with S1 S2. No murmurs, rubs, gallops, or LV heave appreciated. Abdomen: Soft, non-tender, non-distended. No obvious abdominal masses. MSK: Strength and tone appear normal for age. 5/5 in all extremities Extremities: 2+ BLE edema. No clubbing or cyanosis. DP/PT pulses 1+ bilaterally Neuro: Alert and oriented. No focal deficits. No facial asymmetry. MAE spontaneously. Psych: Responds to questions appropriately with normal affect.    Labs    Chemistry Recent Labs  Lab 12/28/18 1000  12/29/18 1845 12/30/18 1011 12/31/18 0507  NA  136   < > 136 139 140  K 5.8*   < > 5.1 4.1 3.7  CL 97*   < > 108 104 103  CO2 16*   < > 15* 19* 23  GLUCOSE 37*   < > 140* 116* 101*  BUN 62*   < > 61* 62* 56*  CREATININE 4.82*   < > 3.86* 4.02* 3.11*  CALCIUM 9.4   < > 8.1* 8.2* 7.9*  PROT 6.6  --   --  4.7*  --   ALBUMIN 3.9  --   --  2.6*  --   AST 405*  --   --  357*  --   ALT 287*  --   --  317*  --   ALKPHOS 269*  --   --  151*  --   BILITOT 2.9*  --   --  2.1*  --   GFRNONAA 8*   < > 11* 10* 14*  GFRAA 9*   < > 12* 12* 16*  ANIONGAP 23*   < > 13 16* 14   < > = values in this interval not displayed.     Hematology Recent Labs  Lab 12/29/18 0612 12/30/18 1011 12/30/18 2317 12/31/18 0507  WBC 6.9 5.6  --  6.3  RBC 4.85 4.35  --  4.61  HGB 14.9 13.3 14.1 14.2  HCT 47.2* 40.6 41.9 43.9  MCV 97.3 93.3  --  95.2  MCH 30.7 30.6  --  30.8  MCHC 31.6 32.8  --  32.3  RDW 15.8* 15.5  --  15.8*  PLT 162 PLATELET CLUMPS NOTED ON SMEAR, UNABLE TO ESTIMATE  --  PLATELET CLUMPS NOTED ON SMEAR, COUNT APPEARS DECREASED    Telemetry    12/31/2018 AF with controlled rates - Personally Reviewed  ECG    No new tracing as of 12/31/2018 - Personally Reviewed  Cardiac Studies   Echocardiogram 12/29/2018: 1. The left ventricle has severely reduced systolic function, with an ejection fraction of 20-25%. The cavity size was normal. Left ventricular diastolic function could not be evaluated secondary to atrial fibrillation.  2. The right ventricle has low normal systolic function. The cavity was moderately enlarged. There is no increase in right ventricular wall thickness.  3. There is interventricular flattening during diastolic indicative of RV volume overload.  4. Left atrial size was mild-moderately dilated.  5. The LA appears larger than the biplane measurements suggest.  6. Right atrial size was moderately dilated.  7. Trivial pericardial effusion is present.  8. Mitral valve regurgitation is moderate by color flow Doppler.  No evidence of mitral valve stenosis.  9. Tricuspid valve regurgitation is moderate-severe. 10. Aortic valve regurgitation is trivial by color flow Doppler. No stenosis of the aortic valve. 11. The aortic root and ascending aorta are normal in size and structure. 12. The inferior vena cava was normal in size with <50% respiratory variability. 13. The interatrial septum was not assessed.  Patient Profile     78 y.o. female with a hx of paroxysmal atrial fibrillation, chronic systolic congestive heart failure, diabetes mellitus, hypothyroidism, anxietywho is being seen today for the evaluation of atrial fibrillation and hypotension. Echocardiogram February 2020 showed ejection fraction 35 to 40%, biatrial enlargement, mild aortic insufficiency. Transesophageal echocardiogram March 2020 showed ejection fraction 40 to 45%, moderate left atrial enlargement, moderate to severe mitral regurgitation and mild to moderate tricuspid regurgitation. Had cardioversion at that time but atrial fibrillation recurred. Ptwas started on amiodarone with plans ultimately to proceed with cardioversion. Patient apparently has had gradual decline since July 4 weekend when her daughter died from congestive heart failure. She was admitted on July 14 with generalized weakness and fatigue. She was found to be in acute renal failure, hypotensive, possible sepsisand with lactic acidosis all felt secondary to dehydration.  Echocardiogram this admission shows ejection fraction 20 to 25%, biatrial enlargement, moderate mitral regurgitation and moderate to severe tricuspid regurgitation.  Assessment & Plan    1.  Hypotension: -Resolving with fluids>>continue for one additional day  -Felt to be secondary to dehydration -Questionable sepsis>> antibiotics per primary team  2.  Persistent atrial fibrillation: -Patient noted to be in atrial fibrillation since at least 10/2018 -Plan was initially for cardioversion>>> can  pursue as an outpatient once recovered from acute illness -Continue amiodarone 200 mg daily along with apixaban for anticoagulation  3.  Mildly elevated troponin: -HsT, 49> 31 -Flat trend and no anginal symptoms and therefore no plans for further ischemic evaluation  4.  Cardiomyopathy: -LVEF reduced to 20 to 25% per echocardiogram this admission -Low-dose beta-blocker and ARB can be added at a later date if BP and renal function allow -Palliative care medicine consulted with recommendations to continue full CODE STATUS per son and continue with current plan  of care per medical team with watchful waiting and follow-up meeting 717  5.  Chronic combined systolic and diastolic congestive heart failure: -Diuretics currently on hold secondary to hypotension -Currently being rehydrated  6.  Acute on chronic kidney disease stage III: -Creatinine, 3.11 today down from 4.02 on 12/30/2018 -Continue to monitor closely  7.  Palliative care consultation: -Palliative care medicine consulted with recommendations to continue full CODE STATUS per son and continue with current plan of care per medical team with watchful waiting and follow-up meeting 12/31/2018   Signed, Kathyrn Drown NP-C [place] Pager: [phone] 12/31/2018, 10:35 AM     For questions or updates, please contact   Please consult [email or web address] for contact info under Cardiology/STEMI. As above, patient seen and examined.  She denies dyspnea or chest pain.  She has trace to 1+ lower extremity edema.  Blood pressure and renal function improving.  We will continue to hydrate today and likely discontinue tomorrow pending creatinine.  Continue to hold Toprol.  Will consider low-dose ARB and beta-blocker in the future if blood pressure and renal function allow.  Kirk Ruths, MD

## 2019-01-01 ENCOUNTER — Inpatient Hospital Stay (HOSPITAL_COMMUNITY): Payer: Medicare Other

## 2019-01-01 LAB — CBC
HCT: 41.6 % (ref 36.0–46.0)
Hemoglobin: 13.6 g/dL (ref 12.0–15.0)
MCH: 30.4 pg (ref 26.0–34.0)
MCHC: 32.7 g/dL (ref 30.0–36.0)
MCV: 93.1 fL (ref 80.0–100.0)
Platelets: 97 10*3/uL — ABNORMAL LOW (ref 150–400)
RBC: 4.47 MIL/uL (ref 3.87–5.11)
RDW: 15.5 % (ref 11.5–15.5)
WBC: 8.1 10*3/uL (ref 4.0–10.5)
nRBC: 0 % (ref 0.0–0.2)

## 2019-01-01 LAB — BASIC METABOLIC PANEL
Anion gap: 12 (ref 5–15)
BUN: 45 mg/dL — ABNORMAL HIGH (ref 8–23)
CO2: 27 mmol/L (ref 22–32)
Calcium: 7.9 mg/dL — ABNORMAL LOW (ref 8.9–10.3)
Chloride: 101 mmol/L (ref 98–111)
Creatinine, Ser: 2.33 mg/dL — ABNORMAL HIGH (ref 0.44–1.00)
GFR calc Af Amer: 22 mL/min — ABNORMAL LOW (ref >60)
GFR calc non Af Amer: 19 mL/min — ABNORMAL LOW (ref >60)
Glucose, Bld: 105 mg/dL — ABNORMAL HIGH (ref 70–99)
Potassium: 3.1 mmol/L — ABNORMAL LOW (ref 3.5–5.1)
Sodium: 140 mmol/L (ref 135–145)

## 2019-01-01 LAB — GLUCOSE, CAPILLARY
Glucose-Capillary: 100 mg/dL — ABNORMAL HIGH (ref 70–99)
Glucose-Capillary: 96 mg/dL (ref 70–99)
Glucose-Capillary: 165 mg/dL — ABNORMAL HIGH (ref 70–99)
Glucose-Capillary: 184 mg/dL — ABNORMAL HIGH (ref 70–99)
Glucose-Capillary: 137 mg/dL — ABNORMAL HIGH (ref 70–99)

## 2019-01-01 LAB — URIC ACID: Uric Acid, Serum: 14.9 mg/dL — ABNORMAL HIGH (ref 2.5–7.1)

## 2019-01-01 MED ORDER — POTASSIUM CHLORIDE CRYS ER 20 MEQ PO TBCR: 30.0000 meq | EXTENDED_RELEASE_TABLET | Freq: Once | ORAL | Status: DC

## 2019-01-01 MED ORDER — COLCHICINE 0.6 MG PO TABS
0.6000 mg | ORAL_TABLET | Freq: Every day | ORAL | Status: DC
  Administered 2019-01-01 – 2019-01-13 (×13): 0.6 mg via ORAL
  Filled 2019-01-01 (×13): qty 1

## 2019-01-01 MED ORDER — POTASSIUM CHLORIDE CRYS ER 20 MEQ PO TBCR: 20.0000 meq | EXTENDED_RELEASE_TABLET | Freq: Once | ORAL | Status: DC

## 2019-01-01 MED ORDER — ALLOPURINOL 100 MG PO TABS
100.0000 mg | ORAL_TABLET | Freq: Every day | ORAL | Status: DC
  Administered 2019-01-01 – 2019-01-13 (×13): 100 mg via ORAL
  Filled 2019-01-01 (×13): qty 1

## 2019-01-01 MED ORDER — SODIUM CHLORIDE 0.9 % IV SOLN
INTRAVENOUS | Status: AC
  Administered 2019-01-01: 15:00:00 via INTRAVENOUS

## 2019-01-01 MED ORDER — PREDNISONE 20 MG PO TABS
50.0000 mg | ORAL_TABLET | Freq: Once | ORAL | Status: AC
  Administered 2019-01-01: 50 mg via ORAL
  Filled 2019-01-01: qty 2

## 2019-01-01 MED ORDER — HYDROCODONE-ACETAMINOPHEN 5-325 MG PO TABS
1.0000 | ORAL_TABLET | Freq: Four times a day (QID) | ORAL | Status: DC | PRN
  Administered 2019-01-01 – 2019-01-12 (×14): 1 via ORAL
  Filled 2019-01-01 (×14): qty 1

## 2019-01-01 MED ORDER — ACETAMINOPHEN 325 MG PO TABS
650.0000 mg | ORAL_TABLET | Freq: Once | ORAL | Status: AC
  Administered 2019-01-01: 650 mg via ORAL
  Filled 2019-01-01: qty 2

## 2019-01-01 MED ORDER — POTASSIUM CHLORIDE 20 MEQ/15ML (10%) PO SOLN
30.0000 meq | Freq: Once | ORAL | Status: AC
  Administered 2019-01-01: 30 meq via ORAL
  Filled 2019-01-01: qty 30

## 2019-01-01 MED ORDER — LACTULOSE 10 GM/15ML PO SOLN
20.0000 g | Freq: Every day | ORAL | Status: DC
  Administered 2019-01-01: 20 g via ORAL
  Filled 2019-01-01: qty 30

## 2019-01-01 MED ORDER — LACTULOSE 10 GM/15ML PO SOLN: 10.0000 g | Freq: Every day | ORAL | Status: DC

## 2019-01-01 NOTE — Progress Notes (Signed)
Pt did not go to sleep until 0530. Pt called out on call bell approximately every 5-10 min this shift. Pt had approximately 10-12 loose inc episodes with stool. Communication sent to [facility] request for med to help pt rest during shift. Communication sent to pharmacy to request modification of lactulose to be given no later than 1800. Spoke with son this am and made aware of pt's night and request.

## 2019-01-01 NOTE — Progress Notes (Signed)
Pt remains awake and alert, calling for staff frequently for things such as covering feet-though covered, lights out-pt reminded it was hallway light.

## 2019-01-01 NOTE — Progress Notes (Signed)
PROGRESS NOTE    Teresa [name]  PJK:[id number] DOB: 1940/10/19 DOA: 12/28/2018 PCP: [name], MD   Brief Narrative: 78 year old with past medical history significant for hypertension, diabetes, coronary artery disease, heart failure reduced ejection fraction, A. fib, depression who was brought in because worsening weakness.  History obtained from the son: Per son patient has not been the same since July 4 weekend when patient's daughter died from congestive heart failure.  Patient has not been drinking or eating well. Patient presented with hypoglycemia, hypotensive, acute on chronic renal failure, lactic acidosis with a lactic level at 6.7.  The morning of July 15 patient noted to be more lethargic, worsening acidosis with a bicarb at 11, pH of 7.2.  CCM consulted for further evaluation.    Assessment & Plan:   Principal Problem:   Hypotension due to hypovolemia Active Problems:   Nonischemic cardiomyopathy (HCC)   Malaise and fatigue   Essential (primary) hypertension   Chronic systolic CHF (congestive heart failure), NYHA class 2 (HCC)   Moderate mitral regurgitation   Diabetes mellitus type 2, diet-controlled (HCC)   Hypothyroidism   Anxiety and depression   Acute renal failure (ARF) (HCC)   Atrial fibrillation (HCC)   Transaminitis  1-Hypotension; resolved with IV fluids.  Sepsis rule out.  Patient presented with lactic acidosis: Lactic acid at 6. Blood pressure has improved with IV fluids. 1 out of 4 blood cultures growing staph coagulase-negative.  Repeated blood culture 7-15 no growth to date. Will finished 5 days of antibiotics.  Echocardiogram EF 25 %.  Cardiology consulted for further evaluation of cardiogenic component. Cardiology think hypotension related to hypovolemia.    2-Metabolic acidosis: This could be related to renal failure, versus sepsis. Started IV bicarb drip. Resolved on bicarb gtt. Change fluids to NS>   3-Acute on chronic renal  failure: stage III, prior cr 1.8 Suspect multifactorial to hypovolemia, hypotension. Continue with IV fluids. Cr trending down to 2.3 Monitor urine output. Avoid hypotension. Renal US with small lesion, needs follow up.  NSL fluid to avoid pulmonary edema  4-Acute metabolic encephalopathy: and hepatic encephalopathy.  Suspect multifactorial related to acidosis, hypotension. ABG with a pH of 7.2, bicarb at 11. CCM consulted for evaluation for ICU admission. No need to transfer to ICU per CCM Ammonia elevated at 90---decreased to 36.  Continue with oral lactulose. Change dose to daily and reduce dose.  Improved.   5-1 out of 4 blood cultures positive for staph coagulase-negative: Due to presentation, lactic acidosis, hypotension.  We will continue with IV antibiotics for now. Repeated blood culture no growth to date.  On Ancef. Will finished 5 days.   6-Hyperkalemia: Lokelma ordered. Started bicarb drip. Received calcium gluconate. Resolved.   7-A. fib: Continue with amiodarone. Continue with Eliquis. Per nurse some possible blood after lactulose enema.  No further blood in stool. Hb stable at 14.Marland Kitchen  Resume eliquis.   8-Transaminases: Could be related to shock liver, hypotension. [place] with hepatic steatosis.  Trending down.   9-Hypomagnesemia: Replete IV.  10-HFrEF; Due to hypotension and lactic acidosis cardiology has been consulted for further evaluation of cardiogenic component.  11-Hypothyroidism: Continue with Synthroid.  12-Hypoglycemia: Due to poor oral intake continue with IV fluids  13-nutrition; On remeron.  Son asking about Marinol , will ask palliative to assess.   Acute hypoxic Resp failure;  NSL fluids.  Repeat chest x ray.   Right big toe pain;  Uric acid elevated.  Start colchicine and allopurinol.  Estimated body mass index is 38.86 kg/m as calculated from the following:   Height as of this encounter: 5\' 3"  (1.6 m).   Weight as of this  encounter: 99.5 kg.   DVT prophylaxis: Eliquis Code Status: Full code  family Communication: Disposition Plan: Remain in the hospital for treatment of hypotension, electrolyte abnormalities, renal failure. Consultants:   CCM  Cardiology  Procedures:   Ultrasound pending  Antimicrobials:  Vancomycin 7/14 Zosyn 7/15   Subjective: Had multiples BM last night.  Couldn't  sleep well.  Denies dyspnea.  Complaining of foot pain  Objective: Vitals:   01/01/19 0325 01/01/19 0708 01/01/19 0900 01/01/19 1253  BP: (!) 75/50 134/81 133/78 106/68  Pulse: (!) 106     Resp: (!) 23 (!) 21 18 (!) 21  Temp: 98.2 F (36.8 C)  (!) 97.4 F (36.3 C) 98.5 F (36.9 C)  TempSrc: Oral  Oral Axillary  SpO2:  100% 100% 95%  Weight:      Height:        Intake/Output Summary (Last 24 hours) at 01/01/2019 1429 Last data filed at 01/01/2019 0908 Gross per 24 hour  Intake 1065 ml  Output --  Net 1065 ml   Filed Weights   12/28/18 1309 12/30/18 0325 12/31/18 0658  Weight: 94.3 kg 99.4 kg 99.5 kg    Examination:  General exam: NAD, alert Respiratory system: decrease breath sounds.  Cardiovascular system: S 1, S 2 RRR Gastrointestinal system: BS present, soft, nt Central nervous system: Alert, following command  Extremities: trace edema.  Skin: no rashes.   Data Reviewed: I have personally reviewed following labs and imaging studies  CBC: Recent Labs  Lab 12/28/18 1642 12/29/18 0612 12/30/18 1011 12/30/18 2317 12/31/18 0507 01/01/19 0416  WBC 10.6* 6.9 5.6  --  6.3 8.1  NEUTROABS 7.6  --   --   --   --   --   HGB 14.6 14.9 13.3 14.1 14.2 13.6  HCT 46.5* 47.2* 40.6 41.9 43.9 41.6  MCV 98.1 97.3 93.3  --  95.2 93.1  PLT 179 162 PLATELET CLUMPS NOTED ON SMEAR, UNABLE TO ESTIMATE  --  PLATELET CLUMPS NOTED ON SMEAR, COUNT APPEARS DECREASED 97*   Basic Metabolic Panel: Recent Labs  Lab 12/29/18 0612  12/29/18 1012 12/29/18 1845 12/30/18 1011 12/31/18 0507 01/01/19 0416   NA  --    < > 137 136 139 140 140  K  --    < > 5.2* 5.1 4.1 3.7 3.1*  CL  --    < > 107 108 104 103 101  CO2  --    < > 14* 15* 19* 23 27  GLUCOSE  --    < > 79 140* 116* 101* 105*  BUN  --    < > 59* 61* 62* 56* 45*  CREATININE  --    < > 3.98* 3.86* 4.02* 3.11* 2.33*  CALCIUM  --    < > 8.3* 8.1* 8.2* 7.9* 7.9*  MG 1.9  --   --   --  1.9  --   --    < > = values in this interval not displayed.   GFR: Estimated Creatinine Clearance: 22.4 mL/min (A) (by C-G formula based on SCr of 2.33 mg/dL (H)). Liver Function Tests: Recent Labs  Lab 12/28/18 1000 12/30/18 1011 12/31/18 1127  AST 405* 357* 268*  ALT 287* 317* 294*  ALKPHOS 269* 151* 142*  BILITOT 2.9* 2.1* 1.9*  PROT 6.6 4.7* 4.9*  ALBUMIN 3.9 2.6* 2.6*   No results for input(s): LIPASE, AMYLASE in the last 168 hours. Recent Labs  Lab 12/30/18 1420 12/31/18 0507  AMMONIA 91* 36*   Coagulation Profile: No results for input(s): INR, PROTIME in the last 168 hours. Cardiac Enzymes: No results for input(s): CKTOTAL, CKMB, CKMBINDEX, TROPONINI in the last 168 hours. BNP (last 3 results) No results for input(s): PROBNP in the last 8760 hours. HbA1C: No results for input(s): HGBA1C in the last 72 hours. CBG: Recent Labs  Lab 12/31/18 2001 12/31/18 2350 01/01/19 0324 01/01/19 0747 01/01/19 1227  GLUCAP 123* 110* 100* 96 165*   Lipid Profile: No results for input(s): CHOL, HDL, LDLCALC, TRIG, CHOLHDL, LDLDIRECT in the last 72 hours. Thyroid Function Tests: No results for input(s): TSH, T4TOTAL, FREET4, T3FREE, THYROIDAB in the last 72 hours. Anemia Panel: No results for input(s): VITAMINB12, FOLATE, FERRITIN, TIBC, IRON, RETICCTPCT in the last 72 hours. Sepsis Labs: Recent Labs  Lab 12/28/18 1002 12/28/18 1421 12/28/18 1521 12/29/18 0612 12/29/18 0811 12/29/18 1012 12/30/18 0621  PROCALCITON  --  0.57  --  0.62  --   --  0.59  LATICACIDVEN 6.9*  --  4.9*  --  2.7* 2.6*  --     Recent Results (from the  past 240 hour(s))  Blood culture (routine x 2)     Status: Abnormal   Collection Time: 12/28/18 10:01 AM   Specimen: BLOOD  Result Value Ref Range Status   Specimen Description BLOOD LEFT ANTECUBITAL  Final   Special Requests   Final    BOTTLES DRAWN AEROBIC AND ANAEROBIC Blood Culture adequate volume   Culture  Setup Time   Final    GRAM POSITIVE COCCI ANAEROBIC BOTTLE ONLY CRITICAL RESULT CALLED TO, READ BACK BY AND VERIFIED WITH: PHARMD V BRYK [id number] MLM    Culture (A)  Final    STAPHYLOCOCCUS SPECIES (COAGULASE NEGATIVE) THE SIGNIFICANCE OF ISOLATING THIS ORGANISM FROM A SINGLE SET OF BLOOD CULTURES WHEN MULTIPLE SETS ARE DRAWN IS UNCERTAIN. PLEASE NOTIFY THE MICROBIOLOGY DEPARTMENT WITHIN ONE WEEK IF SPECIATION AND SENSITIVITIES ARE REQUIRED. Performed at [facility], [place] [address], [place], [place] [address]    Report Status 12/31/2018 FINAL  Final  Blood Culture ID Panel (Reflexed)     Status: Abnormal   Collection Time: 12/28/18 10:01 AM  Result Value Ref Range Status   Enterococcus species NOT DETECTED NOT DETECTED Final   Listeria monocytogenes NOT DETECTED NOT DETECTED Final   Staphylococcus species DETECTED (A) NOT DETECTED Final    Comment: Methicillin (oxacillin) susceptible coagulase negative staphylococcus. Possible blood culture contaminant (unless isolated from more than one blood culture draw or clinical case suggests pathogenicity). No antibiotic treatment is indicated for blood  culture contaminants. CRITICAL RESULT CALLED TO, READ BACK BY AND VERIFIED WITH: PHARMD V BRYK [id number] MLM    Staphylococcus aureus (BCID) NOT DETECTED NOT DETECTED Final   Methicillin resistance NOT DETECTED NOT DETECTED Final   Streptococcus species NOT DETECTED NOT DETECTED Final   Streptococcus agalactiae NOT DETECTED NOT DETECTED Final   Streptococcus pneumoniae NOT DETECTED NOT DETECTED Final   Streptococcus pyogenes NOT DETECTED NOT DETECTED Final    Acinetobacter baumannii NOT DETECTED NOT DETECTED Final   Enterobacteriaceae species NOT DETECTED NOT DETECTED Final   Enterobacter cloacae complex NOT DETECTED NOT DETECTED Final   Escherichia coli NOT DETECTED NOT DETECTED Final   Klebsiella oxytoca NOT DETECTED NOT DETECTED Final   Klebsiella pneumoniae NOT DETECTED NOT DETECTED  Final   Proteus species NOT DETECTED NOT DETECTED Final   Serratia marcescens NOT DETECTED NOT DETECTED Final   Haemophilus influenzae NOT DETECTED NOT DETECTED Final   Neisseria meningitidis NOT DETECTED NOT DETECTED Final   Pseudomonas aeruginosa NOT DETECTED NOT DETECTED Final   Candida albicans NOT DETECTED NOT DETECTED Final   Candida glabrata NOT DETECTED NOT DETECTED Final   Candida krusei NOT DETECTED NOT DETECTED Final   Candida parapsilosis NOT DETECTED NOT DETECTED Final   Candida tropicalis NOT DETECTED NOT DETECTED Final    Comment: Performed at [facility], [place] [address] [place], [place] [address]  Blood culture (routine x 2)     Status: None (Preliminary result)   Collection Time: 12/28/18 10:06 AM   Specimen: BLOOD  Result Value Ref Range Status   Specimen Description BLOOD SITE NOT SPECIFIED  Final   Special Requests   Final    BOTTLES DRAWN AEROBIC ONLY Blood Culture results may not be optimal due to an inadequate volume of blood received in culture bottles   Culture   Final    NO GROWTH 4 DAYS Performed at [facility], [place] [address] [place], [place] [address]    Report Status PENDING  Incomplete  SARS Coronavirus 2 (CEPHEID- Performed in [facility]), Hosp Order     Status: None   Collection Time: 12/28/18 10:14 AM   Specimen: Nasopharyngeal Swab  Result Value Ref Range Status   SARS Coronavirus 2 NEGATIVE NEGATIVE Final    Comment: (NOTE) If result is NEGATIVE SARS-CoV-2 target nucleic acids are NOT DETECTED. The SARS-CoV-2 RNA is generally detectable in upper and lower  respiratory specimens during  the acute phase of infection. The lowest  concentration of SARS-CoV-2 viral copies this assay can detect is 250  copies / mL. A negative result does not preclude SARS-CoV-2 infection  and should not be used as the sole basis for treatment or other  patient management decisions.  A negative result may occur with  improper specimen collection / handling, submission of specimen other  than nasopharyngeal swab, presence of viral mutation(s) within the  areas targeted by this assay, and inadequate number of viral copies  (<250 copies / mL). A negative result must be combined with clinical  observations, patient history, and epidemiological information. If result is POSITIVE SARS-CoV-2 target nucleic acids are DETECTED. The SARS-CoV-2 RNA is generally detectable in upper and lower  respiratory specimens dur ing the acute phase of infection.  Positive  results are indicative of active infection with SARS-CoV-2.  Clinical  correlation with patient history and other diagnostic information is  necessary to determine patient infection status.  Positive results do  not rule out bacterial infection or co-infection with other viruses. If result is PRESUMPTIVE POSTIVE SARS-CoV-2 nucleic acids MAY BE PRESENT.   A presumptive positive result was obtained on the submitted specimen  and confirmed on repeat testing.  While 2019 novel coronavirus  (SARS-CoV-2) nucleic acids may be present in the submitted sample  additional confirmatory testing may be necessary for epidemiological  and / or clinical management purposes  to differentiate between  SARS-CoV-2 and other Sarbecovirus currently known to infect humans.  If clinically indicated additional testing with an alternate test  methodology [id number]) is advised. The SARS-CoV-2 RNA is generally  detectable in upper and lower respiratory sp ecimens during the acute  phase of infection. The expected result is Negative. Fact Sheet for Patients:   StrictlyIdeas.no Fact Sheet for  Healthcare Providers: BankingDealers.co.za This test is not yet approved or cleared by the [place] and has been authorized for detection and/or diagnosis of SARS-CoV-2 by FDA under an Emergency Use Authorization (EUA).  This EUA will remain in effect (meaning this test can be used) for the duration of the COVID-19 declaration under Section 564(b)(1) of the Act, 21 U.S.C. section 360bbb-3(b)(1), unless the authorization is terminated or revoked sooner. Performed at [facility], [place] [address], [place], [place] [address]   Urine culture     Status: Abnormal   Collection Time: 12/28/18  2:15 PM   Specimen: In/Out Cath Urine  Result Value Ref Range Status   Specimen Description IN/OUT CATH URINE  Final   Special Requests   Final    NONE Performed at [facility], [place] [address], [place], [place] [address]    Culture 10,000 COLONIES/mL ESCHERICHIA COLI (A)  Final   Report Status 12/30/2018 FINAL  Final   Organism ID, Bacteria ESCHERICHIA COLI (A)  Final      Susceptibility   Escherichia coli - MIC*    AMPICILLIN >=32 RESISTANT Resistant     CEFAZOLIN <=4 SENSITIVE Sensitive     CEFTRIAXONE <=1 SENSITIVE Sensitive     CIPROFLOXACIN <=0.25 SENSITIVE Sensitive     GENTAMICIN <=1 SENSITIVE Sensitive     IMIPENEM <=0.25 SENSITIVE Sensitive     NITROFURANTOIN <=16 SENSITIVE Sensitive     TRIMETH/SULFA <=20 SENSITIVE Sensitive     AMPICILLIN/SULBACTAM 8 SENSITIVE Sensitive     PIP/TAZO <=4 SENSITIVE Sensitive     Extended ESBL NEGATIVE Sensitive     * 10,000 COLONIES/mL ESCHERICHIA COLI  MRSA PCR Screening     Status: None   Collection Time: 12/28/18  5:15 PM   Specimen: Nasopharyngeal  Result Value Ref Range Status   MRSA by PCR NEGATIVE NEGATIVE Final    Comment:        The GeneXpert MRSA Assay (FDA approved for NASAL specimens only), is one component of a comprehensive MRSA  colonization surveillance program. It is not intended to diagnose MRSA infection nor to guide or monitor treatment for MRSA infections. Performed at [facility], [place] [address], [place], [place] [address]   Culture, blood (routine x 2)     Status: None (Preliminary result)   Collection Time: 12/29/18 10:00 AM   Specimen: BLOOD  Result Value Ref Range Status   Specimen Description BLOOD RIGHT ANTECUBITAL  Final   Special Requests   Final    BOTTLES DRAWN AEROBIC ONLY Blood Culture adequate volume   Culture   Final    NO GROWTH 3 DAYS Performed at [facility], [place] [address] [place], [place] [address]    Report Status PENDING  Incomplete  Culture, blood (routine x 2)     Status: None (Preliminary result)   Collection Time: 12/29/18 10:12 AM   Specimen: BLOOD  Result Value Ref Range Status   Specimen Description BLOOD LEFT ANTECUBITAL  Final   Special Requests   Final    BOTTLES DRAWN AEROBIC ONLY Blood Culture adequate volume   Culture   Final    NO GROWTH 3 DAYS Performed at [facility], [place] [address], [place], [place] [address]    Report Status PENDING  Incomplete         Radiology Studies: US Renal  Result Date: 12/31/2018 CLINICAL DATA:  Acute renal insufficiency. EXAM: RENAL / URINARY TRACT ULTRASOUND COMPLETE COMPARISON:  CT  12/28/2018 FINDINGS: Right Kidney: Renal measurements: 10.9 x 4.1 x 4.0 cm = volume: 92 mL . Echogenicity within normal limits. No mass or hydronephrosis visualized. 4 mm indeterminate hyperechoic focus over the mid pole cortex which is not seen on recent noncontrast CT. Left Kidney: Renal measurements: 8.9 x 4.5 x 5.0 cm = volume: 104 mL. Echogenicity within normal limits. No mass or hydronephrosis visualized. Bladder: Appears normal for degree of bladder distention. IMPRESSION: Normal size kidneys without hydronephrosis. Indeterminate 4 mm hyperechoic focus over the mid pole cortex of the right kidney which is not seen on  recent noncontrast CT. Recommend follow-up ultrasound 6 months. Electronically Signed   By: Marin Olp M.D.   On: 12/31/2018 16:37   Dg Chest Port 1 View  Result Date: 01/01/2019 CLINICAL DATA:  Hypoxemia, shortness of breath with weakness today. EXAM: PORTABLE CHEST 1 VIEW COMPARISON:  Chest x-rays dated 12/28/2018 and 10/15/2018. FINDINGS: Chronic opacity at the LEFT lung base presumed scarring/atelectasis based on recent CT abdomen of 12/28/2018. No new lung findings. No pneumothorax seen. Stable cardiomegaly. IMPRESSION: Stable chest x-ray. No evidence of acute cardiopulmonary abnormality. No evidence of pneumonia or pulmonary edema. Electronically Signed   By: Franki Cabot M.D.   On: 01/01/2019 08:13        Scheduled Meds:  amiodarone  200 mg Oral Daily   apixaban  5 mg Oral BID   busPIRone  5 mg Oral BID   citalopram  20 mg Oral Daily   famotidine  20 mg Oral QHS   feeding supplement  1 Container Oral TID BM   feeding supplement (ENSURE ENLIVE)  237 mL Oral BID BM   insulin aspart  0-9 Units Subcutaneous Q4H   [START ON 01/02/2019] lactulose  10 g Oral Daily   levothyroxine  100 mcg Oral Q0600   mirtazapine  7.5 mg Oral QHS   Continuous Infusions:   ceFAZolin (ANCEF) IV 1 g (01/01/19 0908)     LOS: 4 days    Time spent: 35 minutes.    Elmarie Shiley, MD [facility] Pager [phone]  If 7PM-7AM, please contact night-coverage [email or web address] Password [facility] 01/01/2019, 2:29 PM

## 2019-01-01 NOTE — Progress Notes (Signed)
Progress Note  Patient Name: Teresa [name] Date of Encounter: 01/01/2019  Primary Cardiologist: Minus Breeding, MD   Subjective   No CP or dyspnea  Inpatient Medications    Scheduled Meds: . amiodarone  200 mg Oral Daily  . apixaban  5 mg Oral BID  . busPIRone  5 mg Oral BID  . citalopram  20 mg Oral Daily  . famotidine  20 mg Oral QHS  . feeding supplement  1 Container Oral TID BM  . feeding supplement (ENSURE ENLIVE)  237 mL Oral BID BM  . insulin aspart  0-9 Units Subcutaneous Q4H  . lactulose  20 g Oral Daily  . levothyroxine  100 mcg Oral Q0600  . mirtazapine  7.5 mg Oral QHS   Continuous Infusions: .  ceFAZolin (ANCEF) IV Stopped (01/01/19 0535)   PRN Meds: polyethylene glycol   Vital Signs    Vitals:   12/31/18 2000 12/31/18 2346 01/01/19 0325 01/01/19 0708  BP:  (!) 106/59 (!) 75/50 134/81  Pulse:  (!) 107 (!) 106   Resp:  (!) 23 (!) 23 (!) 21  Temp:  97.6 F (36.4 C) 98.2 F (36.8 C)   TempSrc:  Oral Oral   SpO2: 100% 98%  100%  Weight:      Height:        Intake/Output Summary (Last 24 hours) at 01/01/2019 0854 Last data filed at 01/01/2019 0719 Gross per 24 hour  Intake 1305 ml  Output -  Net 1305 ml   Last 3 Weights 12/31/2018 12/30/2018 12/28/2018  Weight (lbs) 219 lb 5.7 oz 219 lb 2.2 oz 208 lb  Weight (kg) 99.5 kg 99.4 kg 94.348 kg      Telemetry    Atrial fibrillation rate controlled - Personally Reviewed   Physical Exam   GEN: NAD, WD Neck: supple Cardiac: irregular Respiratory: CTA GI: Soft, NT/ND MS: 1+ edema Neuro:  No focal findings Psych: Normal affect   Labs    High Sensitivity Troponin:   Recent Labs  Lab 12/28/18 1000 12/28/18 1220  TROPONINIHS 49* 31*      Chemistry Recent Labs  Lab 12/28/18 1000  12/30/18 1011 12/31/18 0507 12/31/18 1127 01/01/19 0416  NA 136   < > 139 140  --  140  K 5.8*   < > 4.1 3.7  --  3.1*  CL 97*   < > 104 103  --  101  CO2 16*   < > 19* 23  --  27  GLUCOSE 37*   < >  116* 101*  --  105*  BUN 62*   < > 62* 56*  --  45*  CREATININE 4.82*   < > 4.02* 3.11*  --  2.33*  CALCIUM 9.4   < > 8.2* 7.9*  --  7.9*  PROT 6.6  --  4.7*  --  4.9*  --   ALBUMIN 3.9  --  2.6*  --  2.6*  --   AST 405*  --  357*  --  268*  --   ALT 287*  --  317*  --  294*  --   ALKPHOS 269*  --  151*  --  142*  --   BILITOT 2.9*  --  2.1*  --  1.9*  --   GFRNONAA 8*   < > 10* 14*  --  19*  GFRAA 9*   < > 12* 16*  --  22*  ANIONGAP 23*   < >  16* 14  --  12   < > = values in this interval not displayed.     Hematology Recent Labs  Lab 12/30/18 1011 12/30/18 2317 12/31/18 0507 01/01/19 0416  WBC 5.6  --  6.3 8.1  RBC 4.35  --  4.61 4.47  HGB 13.3 14.1 14.2 13.6  HCT 40.6 41.9 43.9 41.6  MCV 93.3  --  95.2 93.1  MCH 30.6  --  30.8 30.4  MCHC 32.8  --  32.3 32.7  RDW 15.5  --  15.8* 15.5  PLT PLATELET CLUMPS NOTED ON SMEAR, UNABLE TO ESTIMATE  --  PLATELET CLUMPS NOTED ON SMEAR, COUNT APPEARS DECREASED 97*     Radiology    US Renal  Result Date: 12/31/2018 CLINICAL DATA:  Acute renal insufficiency. EXAM: RENAL / URINARY TRACT ULTRASOUND COMPLETE COMPARISON:  CT 12/28/2018 FINDINGS: Right Kidney: Renal measurements: 10.9 x 4.1 x 4.0 cm = volume: 92 mL . Echogenicity within normal limits. No mass or hydronephrosis visualized. 4 mm indeterminate hyperechoic focus over the mid pole cortex which is not seen on recent noncontrast CT. Left Kidney: Renal measurements: 8.9 x 4.5 x 5.0 cm = volume: 104 mL. Echogenicity within normal limits. No mass or hydronephrosis visualized. Bladder: Appears normal for degree of bladder distention. IMPRESSION: Normal size kidneys without hydronephrosis. Indeterminate 4 mm hyperechoic focus over the mid pole cortex of the right kidney which is not seen on recent noncontrast CT. Recommend follow-up ultrasound 6 months. Electronically Signed   By: Marin Olp M.D.   On: 12/31/2018 16:37   Dg Chest Port 1 View  Result Date: 01/01/2019 CLINICAL DATA:   Hypoxemia, shortness of breath with weakness today. EXAM: PORTABLE CHEST 1 VIEW COMPARISON:  Chest x-rays dated 12/28/2018 and 10/15/2018. FINDINGS: Chronic opacity at the LEFT lung base presumed scarring/atelectasis based on recent CT abdomen of 12/28/2018. No new lung findings. No pneumothorax seen. Stable cardiomegaly. IMPRESSION: Stable chest x-ray. No evidence of acute cardiopulmonary abnormality. No evidence of pneumonia or pulmonary edema. Electronically Signed   By: Franki Cabot M.D.   On: 01/01/2019 08:13    Patient Profile     Teresa [name] is a 78 y.o. female with a hx of paroxysmal atrial fibrillation, chronic systolic congestive heart failure, diabetes mellitus, hypothyroidism, anxiety who is being seen today for the evaluation of atrial fibrillation and hypotension. Echocardiogram February 2020 showed ejection fraction 35 to 40%, biatrial enlargement, mild aortic insufficiency.  Transesophageal echocardiogram March 2020 showed ejection fraction 40 to 45%, moderate left atrial enlargement, moderate to severe mitral regurgitation and mild to moderate tricuspid regurgitation.  Had cardioversion at that time but atrial fibrillation recurred. Pt was started on amiodarone with plans ultimately to proceed with cardioversion.  Patient apparently has had gradual decline since July 4 weekend when her daughter died from congestive heart failure.  She was admitted on July 14 with generalized weakness and fatigue.  She was found to be in acute renal failure, hypotensive, possible sepsis and with lactic acidosis all felt secondary to dehydration.   Echocardiogram this admission shows ejection fraction 20 to 25%, biatrial enlargement, moderate mitral regurgitation and moderate to severe tricuspid regurgitation.  Assessment & Plan    1. Hypotension-this was felt likely secondary to dehydration.  She now appears to be becoming volume overloaded.  Discontinue IV fluids.  Hold diuretics for now but may need to  resume low-dose in the near future. 2. Persistent atrial fibrillation-patient has been in atrial fibrillation since at least May.  Plan was for cardioversion.  Would continue amiodarone 200 mg daily.  Continue apixaban.  We can pursue cardioversion as an outpatient when she recovers from present illness. 3. Mildly elevated troponin-minimally elevated.  No clear trend.  Denies chest pain.  No plans for further ischemia evaluation. 4. Cardiomyopathy-LV function was reduced on prior echo but somewhat worse now.  We will add low-dose beta-blocker in the next 24 to 48 hours if blood pressure allows.  Could also consider ARB if renal function continues to improve.  If not we will consider hydralazine nitrates.  Presently would avoid extensive cardiac evaluation given multiple medical conditions including acute renal insufficiency. 5. Chronic combined systolic/diastolic congestive heart failure-Diuretics on hold.    Will likely need to add low dose diuretic in next 24 hours. 6. Acute on chronic stage III kidney disease-Nearing baseline; likely resume demadex in AM at lower dose.  7. Right kidney lesion-noted on ultrasound; fu study 6 months.  For questions or updates, please contact [place] Please consult [email or web address] for contact info under        Signed, Kirk Ruths, MD  01/01/2019, 8:54 AM

## 2019-01-02 ENCOUNTER — Encounter (HOSPITAL_COMMUNITY): Payer: Self-pay

## 2019-01-02 DIAGNOSIS — Z515 Encounter for palliative care: Secondary | ICD-10-CM

## 2019-01-02 DIAGNOSIS — R748 Abnormal levels of other serum enzymes: Secondary | ICD-10-CM

## 2019-01-02 DIAGNOSIS — I5022 Chronic systolic (congestive) heart failure: Secondary | ICD-10-CM

## 2019-01-02 DIAGNOSIS — Z7189 Other specified counseling: Secondary | ICD-10-CM

## 2019-01-02 LAB — BASIC METABOLIC PANEL
Anion gap: 11 (ref 5–15)
BUN: 34 mg/dL — ABNORMAL HIGH (ref 8–23)
CO2: 30 mmol/L (ref 22–32)
Calcium: 8.5 mg/dL — ABNORMAL LOW (ref 8.9–10.3)
Chloride: 98 mmol/L (ref 98–111)
Creatinine, Ser: 1.81 mg/dL — ABNORMAL HIGH (ref 0.44–1.00)
GFR calc Af Amer: 30 mL/min — ABNORMAL LOW (ref >60)
GFR calc non Af Amer: 26 mL/min — ABNORMAL LOW (ref >60)
Glucose, Bld: 197 mg/dL — ABNORMAL HIGH (ref 70–99)
Potassium: 3.4 mmol/L — ABNORMAL LOW (ref 3.5–5.1)
Sodium: 139 mmol/L (ref 135–145)

## 2019-01-02 LAB — GLUCOSE, CAPILLARY
Glucose-Capillary: 130 mg/dL — ABNORMAL HIGH (ref 70–99)
Glucose-Capillary: 130 mg/dL — ABNORMAL HIGH (ref 70–99)
Glucose-Capillary: 142 mg/dL — ABNORMAL HIGH (ref 70–99)
Glucose-Capillary: 153 mg/dL — ABNORMAL HIGH (ref 70–99)
Glucose-Capillary: 153 mg/dL — ABNORMAL HIGH (ref 70–99)
Glucose-Capillary: 158 mg/dL — ABNORMAL HIGH (ref 70–99)
Glucose-Capillary: 234 mg/dL — ABNORMAL HIGH (ref 70–99)

## 2019-01-02 LAB — CBC
HCT: 41.3 % (ref 36.0–46.0)
Hemoglobin: 13 g/dL (ref 12.0–15.0)
MCH: 31.2 pg (ref 26.0–34.0)
MCHC: 31.5 g/dL (ref 30.0–36.0)
MCV: 99 fL (ref 80.0–100.0)
Platelets: 110 10*3/uL — ABNORMAL LOW (ref 150–400)
RBC: 4.17 MIL/uL (ref 3.87–5.11)
RDW: 16.1 % — ABNORMAL HIGH (ref 11.5–15.5)
WBC: 7.1 10*3/uL (ref 4.0–10.5)
nRBC: 0.3 % — ABNORMAL HIGH (ref 0.0–0.2)

## 2019-01-02 LAB — CULTURE, BLOOD (ROUTINE X 2): Culture: NO GROWTH

## 2019-01-02 MED ORDER — CARVEDILOL 3.125 MG PO TABS
3.1250 mg | ORAL_TABLET | Freq: Two times a day (BID) | ORAL | Status: DC
  Administered 2019-01-02 – 2019-01-04 (×4): 3.125 mg via ORAL
  Filled 2019-01-02 (×4): qty 1

## 2019-01-02 MED ORDER — GERHARDT'S BUTT CREAM
TOPICAL_CREAM | Freq: Three times a day (TID) | CUTANEOUS | Status: DC
  Administered 2019-01-02 – 2019-01-06 (×13): via TOPICAL
  Administered 2019-01-06 – 2019-01-07 (×2): 1 via TOPICAL
  Administered 2019-01-07: 09:00:00 via TOPICAL
  Administered 2019-01-08: 1 via TOPICAL
  Administered 2019-01-08 – 2019-01-09 (×4): via TOPICAL
  Administered 2019-01-10: 1 via TOPICAL
  Administered 2019-01-10 – 2019-01-13 (×9): via TOPICAL
  Filled 2019-01-02 (×2): qty 1

## 2019-01-02 MED ORDER — FUROSEMIDE 10 MG/ML IJ SOLN
40.0000 mg | Freq: Two times a day (BID) | INTRAMUSCULAR | Status: DC
  Administered 2019-01-02 – 2019-01-03 (×3): 40 mg via INTRAVENOUS
  Filled 2019-01-02 (×3): qty 4

## 2019-01-02 MED ORDER — POTASSIUM CHLORIDE 20 MEQ/15ML (10%) PO SOLN: 20.0000 meq | Freq: Once | ORAL | Status: DC

## 2019-01-02 MED ORDER — METOPROLOL TARTRATE 5 MG/5ML IV SOLN
5.0000 mg | INTRAVENOUS | Status: DC | PRN
  Administered 2019-01-02: 5 mg via INTRAVENOUS
  Filled 2019-01-02 (×2): qty 5

## 2019-01-02 MED ORDER — PREDNISONE 20 MG PO TABS
40.0000 mg | ORAL_TABLET | Freq: Every day | ORAL | Status: AC
  Administered 2019-01-02 – 2019-01-03 (×2): 40 mg via ORAL
  Filled 2019-01-02 (×2): qty 2

## 2019-01-02 MED ORDER — POTASSIUM CHLORIDE 20 MEQ/15ML (10%) PO SOLN
30.0000 meq | Freq: Once | ORAL | Status: AC
  Administered 2019-01-02: 30 meq via ORAL
  Filled 2019-01-02: qty 30

## 2019-01-02 MED ORDER — DIGOXIN 0.25 MG/ML IJ SOLN
0.2500 mg | Freq: Four times a day (QID) | INTRAMUSCULAR | Status: AC
  Administered 2019-01-02 (×2): 0.25 mg via INTRAVENOUS
  Filled 2019-01-02 (×2): qty 2

## 2019-01-02 NOTE — TOC Progression Note (Signed)
Transition of Care [facility] - Progression Note    Patient Details  Name: VEVA GRIMLEY MRN: [id number] Date of Birth: 1940-09-13  [facility]) CM/SW [place], [place] Phone Number: 01/02/2019, 9:34 AM  Clinical Narrative:     CSW called and spoke with the patient's son, [name] about home health verses skilled nursing facility placement. [name] stated that he did not want his mother to go to rehab due to COVID-19. He stated that he felt that she would decline there. CSW asked if he would be able to help support her at home. He stated that he is trying to coordinate services. CSW stated that she could send him information on "[facility]". CSW shared that this is a referral agency that can help with finding private nurses or care for his mother. CSW asked if he would like to continue with [facility], he stated that he was not sure. CSW offered to send him the CMS home health choice list. He stated he would appreciate that.   CSW offered choice to the patient's son. CSW will need to follow up with him.    Expected Discharge Plan: [place] Barriers to Discharge: No Barriers Identified  Expected Discharge Plan and Services Expected Discharge Plan: [place] In-house Referral: NA Discharge Planning Services: CM Consult Post Acute Care Choice: Home Health, Resumption of Svcs/PTA Provider Living arrangements for the past 2 months: Single Family Home                 DME Arranged: (NA)         HH Arranged: PT, RN [place] Agency: [facility] Date [place]: 12/29/18 Time [place]: 1134 Representative spoke with at [place]: [place] ([place]) Interventions    Readmission Risk Interventions Readmission Risk Prevention Plan 10/18/2018 10/15/2018  Transportation Screening - Complete  HRI or [place] - Complete  Social Work Consult for [place]  Planning/Counseling Complete -  Palliative Care Screening - Not Applicable  Medication Review Press photographer) - Complete  Some recent data might be hidden

## 2019-01-02 NOTE — Progress Notes (Signed)
Daily Progress Note   Patient Name: Teresa [name]       Date: 01/02/2019 DOB: [birth year]  Age: 78 y.o. MRN#: [id number] Attending Physician: Elmarie Shiley, MD Primary Care Physician: [name], MD Admit Date: 12/28/2018  Reason for Consultation/Follow-up: Establishing goals of care  Subjective: Spoke to patient at bedside.  She's hungry and asking for food.  Spent time with RN repositioning, removing bedpan, assisting with feeding.    Spoke to son on the phone.  Had a lengthy conversation about his mother's strong spirit - the folks in his family live a long time with heart and kidney failure.  They have lost multiple family members (patient's daughter on 7/4).  Its very hard to take any more loss right now.  Son is expecting a baby boy in October! Patient wants to be here to hold the new grandson in October.  Talked with son about medications and co-morbidities.  He has a good understanding of her health. We talked about having to balance between kidney failure and heart failure.   We started discussing life being limited for all of [place] & being prepared for the in-evitable.  Per [name] patient has a will, he is on her accounts, funeral arrangements are already paid for. [name] knows it is coming but does not want to think about it.  [name] does not want his mother to go to SNF.  "That would kill her".  He visits her TID, works full time, is married with a new baby coming soon.  We discussed meds for appetite.  Today the patient tells me she is hungry and I'm watching her eat.  I expressed to [name] that I'm worried adding Marinol may worsen her already rapid heart rate.  Giving another appetite stimulant could worsen her prolonged QT interval.  In an effort to avoid harming her I'm going to  avoid adding any additional medications right now.   [name] accepted this plan.  I offered support and told him that we were in this situation together.  Perhaps cardioversion (when safe) will be helpful to her.  Assessment: Friendly, obese elderly female, HOH.  Volume status is currently a balancing act between renal failure and heart failure.  Family is doing a good job of caring for her and attempting to keep her at home, but  I'm worried this will become overwhelming.   Patient Profile/HPI:  Palliative Care consult requested for this 78 y.o. female with multiple medical problems including hypertension, diabetes, arthritis, glaucoma, CAD, systolic congestive heart failure, atrial fibrillation, and depression. She presented from home with son to ED with complaints of generalized weakness, poor appetite, and difficulty walking. Son reports patient is depressed as her only daughter passed away on 01/04/2023 due to CHF complications. Since admission patient continues to be lethargic, with hypotension, lethargy, and worsening renal failure.   Length of Stay: 5  Current Medications: Scheduled Meds:  . allopurinol  100 mg Oral Daily  . amiodarone  200 mg Oral Daily  . apixaban  5 mg Oral BID  . busPIRone  5 mg Oral BID  . carvedilol  3.125 mg Oral BID WC  . citalopram  20 mg Oral Daily  . colchicine  0.6 mg Oral Daily  . digoxin  0.25 mg Intravenous Q6H  . famotidine  20 mg Oral QHS  . feeding supplement  1 Container Oral TID BM  . feeding supplement (ENSURE ENLIVE)  237 mL Oral BID BM  . furosemide  40 mg Intravenous BID  . insulin aspart  0-9 Units Subcutaneous Q4H  . levothyroxine  100 mcg Oral Q0600  . mirtazapine  7.5 mg Oral QHS  . potassium chloride  30 mEq Oral Once  . predniSONE  40 mg Oral Q breakfast    Continuous Infusions:   PRN Meds: HYDROcodone-acetaminophen, metoprolol tartrate, polyethylene glycol  Physical Exam        Very pleasant elderly female, HOH, appears  edematous, multiple bruises on swollen upper arms CV tachy & irreg Resp no distress Abdomen obese, nt, nd  Vital Signs: BP 110/72   Pulse (!) 108   Temp 98.5 F (36.9 C) (Oral)   Resp 20   Ht 5\' 3"  (1.6 m)   Wt 99.5 kg   SpO2 99%   BMI 38.86 kg/m  SpO2: SpO2: 99 % O2 Device: O2 Device: High Flow Nasal Cannula O2 Flow Rate: O2 Flow Rate (L/min): 2 L/min  Intake/output summary:   Intake/Output Summary (Last 24 hours) at 01/02/2019 1353 Last data filed at 01/02/2019 1324 Gross per 24 hour  Intake 1160 ml  Output 1325 ml  Net -165 ml   LBM: Last BM Date: 01/02/19 Baseline Weight: Weight: 88.5 kg Most recent weight: Weight: 99.5 kg       Palliative Assessment/Data: 30%     Patient Active Problem List   Diagnosis Date Noted  . Sepsis ([place]) 12/28/2018  . Transaminitis 10/15/2018  . Thrombocytopenia ([place]) 10/15/2018  . Atrial fibrillation ([place]) 10/13/2018  . Acute on chronic congestive heart failure ([place])   . AKI (acute kidney injury) ([place])   . Hypomagnesemia   . Pressure injury of skin 08/12/2018  . Acute renal failure (ARF) ([place]) 08/10/2018  . Hypotension due to hypovolemia 08/10/2018  . Hypokalemia 08/10/2018  . Hyponatremia 08/10/2018  . Metabolic acidosis, increased anion gap 08/10/2018  . Hypocalcemia 08/10/2018  . Elevated troponin 08/10/2018  . Prolonged QT interval 08/10/2018  . Atrial fibrillation with RVR ([place]) 08/10/2018  . Pancreatic pseudocyst 11/09/2017  . Polymyalgia rheumatica ([place]) 10/12/2016  . Anxiety and depression 10/12/2016  . Bilateral hip pain 06/24/2016  . Chronic pain of both shoulders 06/24/2016  . Psychophysiological insomnia 05/12/2016  . Spinal stenosis   . History of glaucoma   . History of small bowel obstruction   . Peptic ulcer disease   .  Chronic systolic CHF (congestive heart failure), NYHA class 2 ([place])   . Moderate mitral regurgitation   . Moderate tricuspid regurgitation   . H/O hiatal hernia   . Urge urinary  incontinence   . Left ovarian cyst   . Diabetes mellitus type 2, diet-controlled ([place])   . Shortness of breath   . Wears dentures   . Hypothyroidism   . Generalized anxiety disorder 09/15/2013  . Hernia, incisional 05/10/2012  . Abdominal wall hernia 02/10/2012  . Dyslipidemia 05/01/2011  . GERD (gastroesophageal reflux disease) 05/01/2011  . Osteoarthritis 05/01/2011  . Chronic renal insufficiency 05/01/2011  . HLD (hyperlipidemia) 05/01/2011  . SBO (small bowel obstruction), s/p expl lap 04/24/2011  . Nonischemic cardiomyopathy ([place])   . Arthritis of ankle or foot, degenerative 07/12/2010  . Arthritis of hand, degenerative 07/12/2010  . Malaise and fatigue 07/12/2010  . Essential (primary) hypertension 07/12/2010  . Glaucoma 07/12/2010    Palliative Care Plan    Recommendations/Plan:  PMT will continue to follow with you to work with family for support and to gently refine GOC.  Will suggest to [facility] that Palliative Care follow outpatient (at a minimum).  Already on Remeron and prednisone.  Will not add an appetite stimulant now.  Further she's eating well today.   Discussed with son.  Goals of Care and Additional Recommendations:  Limitations on Scope of Treatment: Full Scope Treatment  Code Status:  Full code  Prognosis:   Unable to determine  She is at high risk for acute decline and death given cardiac and renal status, poor PO intake, severe deconditioning and frailty.   Discharge Planning:  Home with Palliative Services and home health.  Care plan was discussed with patient, RN  Thank you for allowing the Palliative Medicine Team to assist in the care of this patient.  Total time spent:  60 min.     Greater than 50%  of this time was spent counseling and coordinating care related to the above assessment and plan.  Florentina Jenny, PA-C Palliative Medicine  Please contact Palliative MedicineTeam phone at [phone] for questions and concerns  between 7 am - 7 pm.   Please see AMION for individual provider pager numbers.

## 2019-01-02 NOTE — Progress Notes (Signed)
OT Cancellation Note  Patient Details Name: Teresa [name] MRN: [id number] DOB: 11-02-1940   Cancelled Treatment:    Reason Eval/Treat Not Completed: Other (comment)(Son in room and pt in severe pain with gout in RLE.) OT continue to follow-up with pt for mobility and ADL as needed. Son reports he is finding 24/7 care for her and refuses SNF at this time.  Ebony Hail Harold Hedge) Marsa Aris OTR/L Acute Rehabilitation Services Pager: [phone] Office: [place] 01/02/2019, 2:43 PM

## 2019-01-02 NOTE — Progress Notes (Addendum)
PROGRESS NOTE    Teresa [name]  YWV:[id number] DOB: 01-08-1941 DOA: 12/28/2018 PCP: [name], MD   Brief Narrative: 78 year old with past medical history significant for hypertension, diabetes, coronary artery disease, heart failure reduced ejection fraction, A. fib, depression who was brought in because worsening weakness.  History obtained from the son: Per son Teresa [name] has not been the same since July 4 weekend when Teresa [name]'s daughter died from congestive heart failure.  Teresa [name] has not been drinking or eating well. Teresa [name] presented with hypoglycemia, hypotensive, acute on chronic renal failure, lactic acidosis with a lactic level at 6.7.  The morning of July 15 Teresa [name] noted to be more lethargic, worsening acidosis with a bicarb at 11, pH of 7.2.  CCM consulted for further evaluation.    Assessment & Plan:   Principal Problem:   Hypotension due to hypovolemia Active Problems:   Nonischemic cardiomyopathy (HCC)   Malaise and fatigue   Essential (primary) hypertension   Chronic systolic CHF (congestive heart failure), NYHA class 2 (HCC)   Moderate mitral regurgitation   Diabetes mellitus type 2, diet-controlled (HCC)   Hypothyroidism   Anxiety and depression   Acute renal failure (ARF) (HCC)   Atrial fibrillation (HCC)   Abnormal transaminases   Palliative care encounter    1-Hypotension; resolved with IV fluids.  Sepsis rule out.  Teresa [name] presented with lactic acidosis: Lactic acid at 6. Blood pressure has improved with IV fluids. 1 out of 4 blood cultures growing staph coagulase-negative.  Repeated blood culture 7-15 no growth to date. Will finished 5 days of antibiotics.  Echocardiogram EF 25 %.  Cardiology consulted for further evaluation of cardiogenic component. Cardiology think hypotension related to hypovolemia.    2-Metabolic acidosis: This could be related to renal failure, versus sepsis. Started IV bicarb drip. Resolved on bicarb gtt. Change fluids  to NS>   3-Acute on chronic renal failure: stage III, prior cr 1.8 Suspect multifactorial to hypovolemia, hypotension. Continue with IV fluids. Cr trending down to 2.3 Monitor urine output. Avoid hypotension. Renal US with small lesion, needs follow up.  NSL fluid to avoid pulmonary edema Renal function stable.   4-Acute metabolic encephalopathy: and hepatic encephalopathy.  Suspect multifactorial related to acidosis, hypotension. ABG with a pH of 7.2, bicarb at 11. CCM consulted for evaluation for ICU admission. No need to transfer to ICU per CCM Ammonia elevated at 90---decreased to 36.  Hold lactulose due to frequent BM Improved.   5-1 out of 4 blood cultures positive for staph coagulase-negative: Due to presentation, lactic acidosis, hypotension.  We will continue with IV antibiotics for now. Repeated blood culture no growth to date.  On Ancef. Finished 5 days. Stop antibiotics.   6-Hyperkalemia: Lokelma ordered. Started bicarb drip. Received calcium gluconate. Resolved.   7-A. fib: Continue with amiodarone. Continue with Eliquis. Per nurse some possible blood after lactulose enema.  No further blood in stool. Hb stable at 14.Marland Kitchen  Resume eliquis.  Cardiology ordered digoxin.   8-Transaminases: Could be related to shock liver, hypotension. [place] with hepatic steatosis.  Trending down.   9-Hypomagnesemia: Replaced.   10-Acute on Chronic HFrEF; Plan to start IV lasix today.  Monitor renal function   11-Hypothyroidism: Continue with Synthroid.  12-Hypoglycemia: Due to poor oral intake continue with IV fluids  13-nutrition; On remeron.  Son asking about Marinol , will ask palliative to assess.   Acute hypoxic Resp failure;  NSL fluids.  Started on IV lasix.   Right big toe Gout; Uric acid  elevated.  Started  colchicine and allopurinol.  Will give prednisone for 3 days.   Estimated body mass index is 38.86 kg/m as calculated from the following:   Height as  of this encounter: 5\' 3"  (1.6 m).   Weight as of this encounter: 99.5 kg.   DVT prophylaxis: Eliquis Code Status: Full code  family Communication: Disposition Plan: Remain in the hospital for treatment of hypotension, electrolyte abnormalities, renal failure. Consultants:   CCM  Cardiology  Procedures:   Ultrasound pending  Antimicrobials:  Vancomycin 7/14 Zosyn 7/15   Subjective: Having multiples BM, hold lactulose.  Complaining of right foot pain  Objective: Vitals:   01/02/19 0800 01/02/19 0836 01/02/19 1100 01/02/19 1200  BP:  111/82 110/72   Pulse:  (!) 108    Resp: (!) 21 (!) 21 20 20   Temp:  98.5 F (36.9 C) 98.3 F (36.8 C)   TempSrc:  Oral Oral   SpO2:  93% 98% 99%  Weight:      Height:        Intake/Output Summary (Last 24 hours) at 01/02/2019 1439 Last data filed at 01/02/2019 1359 Gross per 24 hour  Intake 1160 ml  Output 1075 ml  Net 85 ml   Filed Weights   12/28/18 1309 12/30/18 0325 12/31/18 0658  Weight: 94.3 kg 99.4 kg 99.5 kg    Examination:  General exam: NAD, follows command Respiratory system: BL crackles.  Cardiovascular system: S 1, S 2 IRR Gastrointestinal system: BS present, soft nt Central nervous system: Alert, following  command Extremities: plus 2 edema Skin: multiples bruises.   Data Reviewed: I have personally reviewed following labs and imaging studies  CBC: Recent Labs  Lab 12/28/18 1642 12/29/18 0612 12/30/18 1011 12/30/18 2317 12/31/18 0507 01/01/19 0416 01/02/19 0704  WBC 10.6* 6.9 5.6  --  6.3 8.1 7.1  NEUTROABS 7.6  --   --   --   --   --   --   HGB 14.6 14.9 13.3 14.1 14.2 13.6 13.0  HCT 46.5* 47.2* 40.6 41.9 43.9 41.6 41.3  MCV 98.1 97.3 93.3  --  95.2 93.1 99.0  PLT 179 162 PLATELET CLUMPS NOTED ON SMEAR, UNABLE TO ESTIMATE  --  PLATELET CLUMPS NOTED ON SMEAR, COUNT APPEARS DECREASED 97* 297*   Basic Metabolic Panel: Recent Labs  Lab 12/29/18 0612  12/29/18 1845 12/30/18 1011 12/31/18 0507  01/01/19 0416 01/02/19 1026  NA  --    < > 136 139 140 140 139  K  --    < > 5.1 4.1 3.7 3.1* 3.4*  CL  --    < > 108 104 103 101 98  CO2  --    < > 15* 19* 23 27 30   GLUCOSE  --    < > 140* 116* 101* 105* 197*  BUN  --    < > 61* 62* 56* 45* 34*  CREATININE  --    < > 3.86* 4.02* 3.11* 2.33* 1.81*  CALCIUM  --    < > 8.1* 8.2* 7.9* 7.9* 8.5*  MG 1.9  --   --  1.9  --   --   --    < > = values in this interval not displayed.   GFR: Estimated Creatinine Clearance: 28.8 mL/min (A) (by C-G formula based on SCr of 1.81 mg/dL (H)). Liver Function Tests: Recent Labs  Lab 12/28/18 1000 12/30/18 1011 12/31/18 1127  AST 405* 357* 268*  ALT 287* 317* 294*  ALKPHOS 269* 151* 142*  BILITOT 2.9* 2.1* 1.9*  PROT 6.6 4.7* 4.9*  ALBUMIN 3.9 2.6* 2.6*   No results for input(s): LIPASE, AMYLASE in the last 168 hours. Recent Labs  Lab 12/30/18 1420 12/31/18 0507  AMMONIA 91* 36*   Coagulation Profile: No results for input(s): INR, PROTIME in the last 168 hours. Cardiac Enzymes: No results for input(s): CKTOTAL, CKMB, CKMBINDEX, TROPONINI in the last 168 hours. BNP (last 3 results) No results for input(s): PROBNP in the last 8760 hours. HbA1C: No results for input(s): HGBA1C in the last 72 hours. CBG: Recent Labs  Lab 01/01/19 2022 01/02/19 0032 01/02/19 0419 01/02/19 0752 01/02/19 1154  GLUCAP 137* 142* 158* 130* 234*   Lipid Profile: No results for input(s): CHOL, HDL, LDLCALC, TRIG, CHOLHDL, LDLDIRECT in the last 72 hours. Thyroid Function Tests: No results for input(s): TSH, T4TOTAL, FREET4, T3FREE, THYROIDAB in the last 72 hours. Anemia Panel: No results for input(s): VITAMINB12, FOLATE, FERRITIN, TIBC, IRON, RETICCTPCT in the last 72 hours. Sepsis Labs: Recent Labs  Lab 12/28/18 1002 12/28/18 1421 12/28/18 1521 12/29/18 0612 12/29/18 0811 12/29/18 1012 12/30/18 0621  PROCALCITON  --  0.57  --  0.62  --   --  0.59  LATICACIDVEN 6.9*  --  4.9*  --  2.7* 2.6*  --      Recent Results (from the past 240 hour(s))  Blood culture (routine x 2)     Status: Abnormal   Collection Time: 12/28/18 10:01 AM   Specimen: BLOOD  Result Value Ref Range Status   Specimen Description BLOOD LEFT ANTECUBITAL  Final   Special Requests   Final    BOTTLES DRAWN AEROBIC AND ANAEROBIC Blood Culture adequate volume   Culture  Setup Time   Final    GRAM POSITIVE COCCI ANAEROBIC BOTTLE ONLY CRITICAL RESULT CALLED TO, READ BACK BY AND VERIFIED WITH: PHARMD V BRYK [id number] MLM    Culture (A)  Final    STAPHYLOCOCCUS SPECIES (COAGULASE NEGATIVE) THE SIGNIFICANCE OF ISOLATING THIS ORGANISM FROM A SINGLE SET OF BLOOD CULTURES WHEN MULTIPLE SETS ARE DRAWN IS UNCERTAIN. PLEASE NOTIFY THE MICROBIOLOGY DEPARTMENT WITHIN ONE WEEK IF SPECIATION AND SENSITIVITIES ARE REQUIRED. Performed at [facility], [place] [address], [place], [place] [address]    Report Status 12/31/2018 FINAL  Final  Blood Culture ID Panel (Reflexed)     Status: Abnormal   Collection Time: 12/28/18 10:01 AM  Result Value Ref Range Status   Enterococcus species NOT DETECTED NOT DETECTED Final   Listeria monocytogenes NOT DETECTED NOT DETECTED Final   Staphylococcus species DETECTED (A) NOT DETECTED Final    Comment: Methicillin (oxacillin) susceptible coagulase negative staphylococcus. Possible blood culture contaminant (unless isolated from more than one blood culture draw or clinical case suggests pathogenicity). No antibiotic treatment is indicated for blood  culture contaminants. CRITICAL RESULT CALLED TO, READ BACK BY AND VERIFIED WITH: PHARMD V BRYK [id number] MLM    Staphylococcus aureus (BCID) NOT DETECTED NOT DETECTED Final   Methicillin resistance NOT DETECTED NOT DETECTED Final   Streptococcus species NOT DETECTED NOT DETECTED Final   Streptococcus agalactiae NOT DETECTED NOT DETECTED Final   Streptococcus pneumoniae NOT DETECTED NOT DETECTED Final   Streptococcus pyogenes NOT DETECTED  NOT DETECTED Final   Acinetobacter baumannii NOT DETECTED NOT DETECTED Final   Enterobacteriaceae species NOT DETECTED NOT DETECTED Final   Enterobacter cloacae complex NOT DETECTED NOT DETECTED Final   Escherichia coli NOT DETECTED NOT DETECTED Final  Klebsiella oxytoca NOT DETECTED NOT DETECTED Final   Klebsiella pneumoniae NOT DETECTED NOT DETECTED Final   Proteus species NOT DETECTED NOT DETECTED Final   Serratia marcescens NOT DETECTED NOT DETECTED Final   Haemophilus influenzae NOT DETECTED NOT DETECTED Final   Neisseria meningitidis NOT DETECTED NOT DETECTED Final   Pseudomonas aeruginosa NOT DETECTED NOT DETECTED Final   Candida albicans NOT DETECTED NOT DETECTED Final   Candida glabrata NOT DETECTED NOT DETECTED Final   Candida krusei NOT DETECTED NOT DETECTED Final   Candida parapsilosis NOT DETECTED NOT DETECTED Final   Candida tropicalis NOT DETECTED NOT DETECTED Final    Comment: Performed at [facility], [place] [address], [place], [place] [address]  Blood culture (routine x 2)     Status: None   Collection Time: 12/28/18 10:06 AM   Specimen: BLOOD  Result Value Ref Range Status   Specimen Description BLOOD SITE NOT SPECIFIED  Final   Special Requests   Final    BOTTLES DRAWN AEROBIC ONLY Blood Culture results may not be optimal due to an inadequate volume of blood received in culture bottles   Culture   Final    NO GROWTH 5 DAYS Performed at [facility], [place] [address], [place], [place] [address]    Report Status 01/02/2019 FINAL  Final  SARS Coronavirus 2 (CEPHEID- Performed in [place] hospital lab), Hosp Order     Status: None   Collection Time: 12/28/18 10:14 AM   Specimen: Nasopharyngeal Swab  Result Value Ref Range Status   SARS Coronavirus 2 NEGATIVE NEGATIVE Final    Comment: (NOTE) If result is NEGATIVE SARS-CoV-2 target nucleic acids are NOT DETECTED. The SARS-CoV-2 RNA is generally detectable in upper and lower  respiratory specimens  during the acute phase of infection. The lowest  concentration of SARS-CoV-2 viral copies this assay can detect is 250  copies / mL. A negative result does not preclude SARS-CoV-2 infection  and should not be used as the sole basis for treatment or other  Teresa [name] management decisions.  A negative result may occur with  improper specimen collection / handling, submission of specimen other  than nasopharyngeal swab, presence of viral mutation(s) within the  areas targeted by this assay, and inadequate number of viral copies  (<250 copies / mL). A negative result must be combined with clinical  observations, Teresa [name] history, and epidemiological information. If result is POSITIVE SARS-CoV-2 target nucleic acids are DETECTED. The SARS-CoV-2 RNA is generally detectable in upper and lower  respiratory specimens dur ing the acute phase of infection.  Positive  results are indicative of active infection with SARS-CoV-2.  Clinical  correlation with Teresa [name] history and other diagnostic information is  necessary to determine Teresa [name] infection status.  Positive results do  not rule out bacterial infection or co-infection with other viruses. If result is PRESUMPTIVE POSTIVE SARS-CoV-2 nucleic acids MAY BE PRESENT.   A presumptive positive result was obtained on the submitted specimen  and confirmed on repeat testing.  While 2019 novel coronavirus  (SARS-CoV-2) nucleic acids may be present in the submitted sample  additional confirmatory testing may be necessary for epidemiological  and / or clinical management purposes  to differentiate between  SARS-CoV-2 and other Sarbecovirus currently known to infect humans.  If clinically indicated additional testing with an alternate test  methodology [id number]) is advised. The SARS-CoV-2 RNA is generally  detectable in upper and lower respiratory sp ecimens during the acute  phase of infection. The  expected result is Negative. Fact Sheet for Patients:   StrictlyIdeas.no Fact Sheet for Healthcare Providers: BankingDealers.co.za This test is not yet approved or cleared by the [place] FDA and has been authorized for detection and/or diagnosis of SARS-CoV-2 by FDA under an Emergency Use Authorization (EUA).  This EUA will remain in effect (meaning this test can be used) for the duration of the COVID-19 declaration under Section 564(b)(1) of the Act, 21 U.S.C. section 360bbb-3(b)(1), unless the authorization is terminated or revoked sooner. Performed at [facility], [place] [address], [place], [place] [address]   Urine culture     Status: Abnormal   Collection Time: 12/28/18  2:15 PM   Specimen: In/Out Cath Urine  Result Value Ref Range Status   Specimen Description IN/OUT CATH URINE  Final   Special Requests   Final    NONE Performed at [facility], [place] [address] [place], [place] [address]    Culture 10,000 COLONIES/mL ESCHERICHIA COLI (A)  Final   Report Status 12/30/2018 FINAL  Final   Organism ID, Bacteria ESCHERICHIA COLI (A)  Final      Susceptibility   Escherichia coli - MIC*    AMPICILLIN >=32 RESISTANT Resistant     CEFAZOLIN <=4 SENSITIVE Sensitive     CEFTRIAXONE <=1 SENSITIVE Sensitive     CIPROFLOXACIN <=0.25 SENSITIVE Sensitive     GENTAMICIN <=1 SENSITIVE Sensitive     IMIPENEM <=0.25 SENSITIVE Sensitive     NITROFURANTOIN <=16 SENSITIVE Sensitive     TRIMETH/SULFA <=20 SENSITIVE Sensitive     AMPICILLIN/SULBACTAM 8 SENSITIVE Sensitive     PIP/TAZO <=4 SENSITIVE Sensitive     Extended ESBL NEGATIVE Sensitive     * 10,000 COLONIES/mL ESCHERICHIA COLI  MRSA PCR Screening     Status: None   Collection Time: 12/28/18  5:15 PM   Specimen: Nasopharyngeal  Result Value Ref Range Status   MRSA by PCR NEGATIVE NEGATIVE Final    Comment:        The GeneXpert MRSA Assay (FDA approved for NASAL specimens only), is one component of a comprehensive MRSA  colonization surveillance program. It is not intended to diagnose MRSA infection nor to guide or monitor treatment for MRSA infections. Performed at [facility], [place] [address] [place], [place] [address]   Culture, blood (routine x 2)     Status: None (Preliminary result)   Collection Time: 12/29/18 10:00 AM   Specimen: BLOOD  Result Value Ref Range Status   Specimen Description BLOOD RIGHT ANTECUBITAL  Final   Special Requests   Final    BOTTLES DRAWN AEROBIC ONLY Blood Culture adequate volume   Culture   Final    NO GROWTH 4 DAYS Performed at [facility], [place] [address] [place], [place] [address]    Report Status PENDING  Incomplete  Culture, blood (routine x 2)     Status: None (Preliminary result)   Collection Time: 12/29/18 10:12 AM   Specimen: BLOOD  Result Value Ref Range Status   Specimen Description BLOOD LEFT ANTECUBITAL  Final   Special Requests   Final    BOTTLES DRAWN AEROBIC ONLY Blood Culture adequate volume   Culture   Final    NO GROWTH 4 DAYS Performed at [facility], [place] [address], [place], [place] [address]    Report Status PENDING  Incomplete         Radiology Studies: US Renal  Result Date: 12/31/2018 CLINICAL DATA:  Acute renal insufficiency. EXAM: RENAL / URINARY TRACT ULTRASOUND COMPLETE COMPARISON:  CT 12/28/2018 FINDINGS: Right Kidney: Renal measurements: 10.9 x 4.1 x 4.0 cm = volume: 92 mL . Echogenicity within normal limits. No mass or hydronephrosis visualized. 4 mm indeterminate hyperechoic focus over the mid pole cortex which is not seen on recent noncontrast CT. Left Kidney: Renal measurements: 8.9 x 4.5 x 5.0 cm = volume: 104 mL. Echogenicity within normal limits. No mass or hydronephrosis visualized. Bladder: Appears normal for degree of bladder distention. IMPRESSION: Normal size kidneys without hydronephrosis. Indeterminate 4 mm hyperechoic focus over the mid pole cortex of the right kidney which is not seen on  recent noncontrast CT. Recommend follow-up ultrasound 6 months. Electronically Signed   By: Marin Olp M.D.   On: 12/31/2018 16:37   Dg Chest Port 1 View  Result Date: 01/01/2019 CLINICAL DATA:  Hypoxemia, shortness of breath with weakness today. EXAM: PORTABLE CHEST 1 VIEW COMPARISON:  Chest x-rays dated 12/28/2018 and 10/15/2018. FINDINGS: Chronic opacity at the LEFT lung base presumed scarring/atelectasis based on recent CT abdomen of 12/28/2018. No new lung findings. No pneumothorax seen. Stable cardiomegaly. IMPRESSION: Stable chest x-ray. No evidence of acute cardiopulmonary abnormality. No evidence of pneumonia or pulmonary edema. Electronically Signed   By: Franki Cabot M.D.   On: 01/01/2019 08:13        Scheduled Meds:  allopurinol  100 mg Oral Daily   amiodarone  200 mg Oral Daily   apixaban  5 mg Oral BID   busPIRone  5 mg Oral BID   carvedilol  3.125 mg Oral BID WC   citalopram  20 mg Oral Daily   colchicine  0.6 mg Oral Daily   digoxin  0.25 mg Intravenous Q6H   famotidine  20 mg Oral QHS   feeding supplement  1 Container Oral TID BM   feeding supplement (ENSURE ENLIVE)  237 mL Oral BID BM   furosemide  40 mg Intravenous BID   Gerhardt's butt cream   Topical TID   insulin aspart  0-9 Units Subcutaneous Q4H   levothyroxine  100 mcg Oral Q0600   mirtazapine  7.5 mg Oral QHS   predniSONE  40 mg Oral Q breakfast   Continuous Infusions:    LOS: 5 days    Time spent: 35 minutes.    Elmarie Shiley, MD [facility] Pager [phone]  If 7PM-7AM, please contact night-coverage [email or web address] Password [facility] 01/02/2019, 2:39 PM

## 2019-01-02 NOTE — Progress Notes (Signed)
Progress Note  Patient Name: Teresa [name] Date of Encounter: 01/02/2019  Primary Cardiologist: Minus Breeding, MD   Subjective   Pt denies CP or dyspnea  Inpatient Medications    Scheduled Meds: . allopurinol  100 mg Oral Daily  . amiodarone  200 mg Oral Daily  . apixaban  5 mg Oral BID  . busPIRone  5 mg Oral BID  . citalopram  20 mg Oral Daily  . colchicine  0.6 mg Oral Daily  . famotidine  20 mg Oral QHS  . feeding supplement  1 Container Oral TID BM  . feeding supplement (ENSURE ENLIVE)  237 mL Oral BID BM  . insulin aspart  0-9 Units Subcutaneous Q4H  . levothyroxine  100 mcg Oral Q0600  . mirtazapine  7.5 mg Oral QHS   Continuous Infusions: .  ceFAZolin (ANCEF) IV Stopped (01/02/19 0526)   PRN Meds: HYDROcodone-acetaminophen, metoprolol tartrate, polyethylene glycol   Vital Signs    Vitals:   01/02/19 0521 01/02/19 0700 01/02/19 0800 01/02/19 0836  BP:  114/84  111/82  Pulse:    (!) 108  Resp:  (!) 22 (!) 21 (!) 21  Temp: 98.3 F (36.8 C)   98.5 F (36.9 C)  TempSrc: Axillary   Oral  SpO2:  96%  93%  Weight:      Height:        Intake/Output Summary (Last 24 hours) at 01/02/2019 0857 Last data filed at 01/02/2019 0527 Gross per 24 hour  Intake 580 ml  Output 525 ml  Net 55 ml   Last 3 Weights 12/31/2018 12/30/2018 12/28/2018  Weight (lbs) 219 lb 5.7 oz 219 lb 2.2 oz 208 lb  Weight (kg) 99.5 kg 99.4 kg 94.348 kg      Telemetry    Atrial fibrillation rate controlled - Personally Reviewed   Physical Exam   GEN: NAD, WD, obese Neck: JVP difficult Cardiac: irregular, no murmur Respiratory: CTA; no wheeze GI: Soft, NT/ND MS: 1+ edema Neuro:  Grossly intact Psych: Normal affect   Labs    High Sensitivity Troponin:   Recent Labs  Lab 12/28/18 1000 12/28/18 1220  TROPONINIHS 49* 31*      Chemistry Recent Labs  Lab 12/28/18 1000  12/30/18 1011 12/31/18 0507 12/31/18 1127 01/01/19 0416  NA 136   < > 139 140  --  140  K 5.8*    < > 4.1 3.7  --  3.1*  CL 97*   < > 104 103  --  101  CO2 16*   < > 19* 23  --  27  GLUCOSE 37*   < > 116* 101*  --  105*  BUN 62*   < > 62* 56*  --  45*  CREATININE 4.82*   < > 4.02* 3.11*  --  2.33*  CALCIUM 9.4   < > 8.2* 7.9*  --  7.9*  PROT 6.6  --  4.7*  --  4.9*  --   ALBUMIN 3.9  --  2.6*  --  2.6*  --   AST 405*  --  357*  --  268*  --   ALT 287*  --  317*  --  294*  --   ALKPHOS 269*  --  151*  --  142*  --   BILITOT 2.9*  --  2.1*  --  1.9*  --   GFRNONAA 8*   < > 10* 14*  --  19*  GFRAA 9*   < >  12* 16*  --  22*  ANIONGAP 23*   < > 16* 14  --  12   < > = values in this interval not displayed.     Hematology Recent Labs  Lab 12/31/18 0507 01/01/19 0416 01/02/19 0704  WBC 6.3 8.1 7.1  RBC 4.61 4.47 4.17  HGB 14.2 13.6 13.0  HCT 43.9 41.6 41.3  MCV 95.2 93.1 99.0  MCH 30.8 30.4 31.2  MCHC 32.3 32.7 31.5  RDW 15.8* 15.5 16.1*  PLT PLATELET CLUMPS NOTED ON SMEAR, COUNT APPEARS DECREASED 97* 110*     Radiology    US Renal  Result Date: 12/31/2018 CLINICAL DATA:  Acute renal insufficiency. EXAM: RENAL / URINARY TRACT ULTRASOUND COMPLETE COMPARISON:  CT 12/28/2018 FINDINGS: Right Kidney: Renal measurements: 10.9 x 4.1 x 4.0 cm = volume: 92 mL . Echogenicity within normal limits. No mass or hydronephrosis visualized. 4 mm indeterminate hyperechoic focus over the mid pole cortex which is not seen on recent noncontrast CT. Left Kidney: Renal measurements: 8.9 x 4.5 x 5.0 cm = volume: 104 mL. Echogenicity within normal limits. No mass or hydronephrosis visualized. Bladder: Appears normal for degree of bladder distention. IMPRESSION: Normal size kidneys without hydronephrosis. Indeterminate 4 mm hyperechoic focus over the mid pole cortex of the right kidney which is not seen on recent noncontrast CT. Recommend follow-up ultrasound 6 months. Electronically Signed   By: Marin Olp M.D.   On: 12/31/2018 16:37   Dg Chest Port 1 View  Result Date: 01/01/2019 CLINICAL DATA:   Hypoxemia, shortness of breath with weakness today. EXAM: PORTABLE CHEST 1 VIEW COMPARISON:  Chest x-rays dated 12/28/2018 and 10/15/2018. FINDINGS: Chronic opacity at the LEFT lung base presumed scarring/atelectasis based on recent CT abdomen of 12/28/2018. No new lung findings. No pneumothorax seen. Stable cardiomegaly. IMPRESSION: Stable chest x-ray. No evidence of acute cardiopulmonary abnormality. No evidence of pneumonia or pulmonary edema. Electronically Signed   By: Franki Cabot M.D.   On: 01/01/2019 08:13    Patient Profile     Teresa [name] is a 78 y.o. female with a hx of paroxysmal atrial fibrillation, chronic systolic congestive heart failure, diabetes mellitus, hypothyroidism, anxiety who is being seen today for the evaluation of atrial fibrillation and hypotension. Echocardiogram February 2020 showed ejection fraction 35 to 40%, biatrial enlargement, mild aortic insufficiency.  Transesophageal echocardiogram March 2020 showed ejection fraction 40 to 45%, moderate left atrial enlargement, moderate to severe mitral regurgitation and mild to moderate tricuspid regurgitation.  Had cardioversion at that time but atrial fibrillation recurred. Pt was started on amiodarone with plans ultimately to proceed with cardioversion.  Patient apparently has had gradual decline since July 4 weekend when her daughter died from congestive heart failure.  She was admitted on July 14 with generalized weakness and fatigue.  She was found to be in acute renal failure, hypotensive, possible sepsis and with lactic acidosis all felt secondary to dehydration.   Echocardiogram this admission shows ejection fraction 20 to 25%, biatrial enlargement, moderate mitral regurgitation and moderate to severe tricuspid regurgitation.  Assessment & Plan    1. Hypotension-this was felt likely secondary to dehydration at time of admission.  Now resolved.  She is volume overloaded on examination today.  Her renal function was  nearing baseline yesterday with results pending today.  I will begin Lasix 40 mg IV twice daily.  Follow renal function closely. 2. Persistent atrial fibrillation-patient has been in atrial fibrillation since at least May.  Plan was for cardioversion.  Would continue amiodarone 200 mg daily.    Heart rate is elevated and blood pressure borderline.  I will give digoxin 0.25 mg IV twice today.  Follow heart rate and add additional medications as needed and as tolerated by blood pressure.  Continue apixaban.    May need to consider cardioversion sooner to see if that would help both with CHF and heart rate. 3. Mildly elevated troponin-minimally elevated.  No clear trend.  Denies chest pain.  No plans for further ischemia evaluation. 4. Cardiomyopathy-LV function was reduced on prior echo but somewhat worse now.  Add carvedilol 3.125 mg twice daily both for cardiomyopathy and to assist with rate control.  We will add ARB later if blood pressure allows. 5. Chronic combined systolic/diastolic congestive heart failure-patient now volume overloaded.  Add Lasix 40 mg IV twice daily and follow. 6. Acute on chronic stage III kidney disease-Recheck bmet in AM. 7. Right kidney lesion-noted on ultrasound; fu study 6 months.  For questions or updates, please contact [place] Please consult [email or web address] for contact info under        Signed, Kirk Ruths, MD  01/02/2019, 8:57 AM

## 2019-01-03 DIAGNOSIS — I071 Rheumatic tricuspid insufficiency: Secondary | ICD-10-CM

## 2019-01-03 DIAGNOSIS — I428 Other cardiomyopathies: Secondary | ICD-10-CM

## 2019-01-03 DIAGNOSIS — I340 Nonrheumatic mitral (valve) insufficiency: Secondary | ICD-10-CM

## 2019-01-03 DIAGNOSIS — F419 Anxiety disorder, unspecified: Secondary | ICD-10-CM

## 2019-01-03 DIAGNOSIS — F329 Major depressive disorder, single episode, unspecified: Secondary | ICD-10-CM

## 2019-01-03 DIAGNOSIS — I5043 Acute on chronic combined systolic (congestive) and diastolic (congestive) heart failure: Secondary | ICD-10-CM

## 2019-01-03 DIAGNOSIS — N170 Acute kidney failure with tubular necrosis: Secondary | ICD-10-CM

## 2019-01-03 LAB — BASIC METABOLIC PANEL
Anion gap: 9 (ref 5–15)
BUN: 34 mg/dL — ABNORMAL HIGH (ref 8–23)
CO2: 30 mmol/L (ref 22–32)
Calcium: 7.8 mg/dL — ABNORMAL LOW (ref 8.9–10.3)
Chloride: 99 mmol/L (ref 98–111)
Creatinine, Ser: 1.6 mg/dL — ABNORMAL HIGH (ref 0.44–1.00)
GFR calc Af Amer: 35 mL/min — ABNORMAL LOW (ref >60)
GFR calc non Af Amer: 31 mL/min — ABNORMAL LOW (ref >60)
Glucose, Bld: 99 mg/dL (ref 70–99)
Potassium: 3.8 mmol/L (ref 3.5–5.1)
Sodium: 138 mmol/L (ref 135–145)

## 2019-01-03 LAB — CBC
HCT: 36.5 % (ref 36.0–46.0)
Hemoglobin: 11.9 g/dL — ABNORMAL LOW (ref 12.0–15.0)
MCH: 30.7 pg (ref 26.0–34.0)
MCHC: 32.6 g/dL (ref 30.0–36.0)
MCV: 94.1 fL (ref 80.0–100.0)
Platelets: 92 10*3/uL — ABNORMAL LOW (ref 150–400)
RBC: 3.88 MIL/uL (ref 3.87–5.11)
RDW: 15.7 % — ABNORMAL HIGH (ref 11.5–15.5)
WBC: 7.3 10*3/uL (ref 4.0–10.5)
nRBC: 0 % (ref 0.0–0.2)

## 2019-01-03 LAB — HEPATIC FUNCTION PANEL
ALT: 48 U/L — ABNORMAL HIGH (ref 0–44)
AST: 58 U/L — ABNORMAL HIGH (ref 15–41)
Albumin: 2.2 g/dL — ABNORMAL LOW (ref 3.5–5.0)
Alkaline Phosphatase: 149 U/L — ABNORMAL HIGH (ref 38–126)
Bilirubin, Direct: 0.7 mg/dL — ABNORMAL HIGH (ref 0.0–0.2)
Indirect Bilirubin: 1 mg/dL — ABNORMAL HIGH (ref 0.3–0.9)
Total Bilirubin: 1.7 mg/dL — ABNORMAL HIGH (ref 0.3–1.2)
Total Protein: 4.4 g/dL — ABNORMAL LOW (ref 6.5–8.1)

## 2019-01-03 LAB — CULTURE, BLOOD (ROUTINE X 2)
Culture: NO GROWTH
Culture: NO GROWTH
Special Requests: ADEQUATE
Special Requests: ADEQUATE

## 2019-01-03 LAB — GLUCOSE, CAPILLARY
Glucose-Capillary: 134 mg/dL — ABNORMAL HIGH (ref 70–99)
Glucose-Capillary: 96 mg/dL (ref 70–99)
Glucose-Capillary: 172 mg/dL — ABNORMAL HIGH (ref 70–99)
Glucose-Capillary: 168 mg/dL — ABNORMAL HIGH (ref 70–99)
Glucose-Capillary: 158 mg/dL — ABNORMAL HIGH (ref 70–99)

## 2019-01-03 LAB — MAGNESIUM: Magnesium: 1.4 mg/dL — ABNORMAL LOW (ref 1.7–2.4)

## 2019-01-03 MED ORDER — WHITE PETROLATUM EX OINT
TOPICAL_OINTMENT | CUTANEOUS | Status: AC
  Filled 2019-01-03: qty 28.35

## 2019-01-03 MED ORDER — BLISTEX MEDICATED EX OINT
TOPICAL_OINTMENT | CUTANEOUS | Status: DC | PRN
  Filled 2019-01-03: qty 6.3

## 2019-01-03 MED ORDER — POTASSIUM CHLORIDE CRYS ER 20 MEQ PO TBCR
40.0000 meq | EXTENDED_RELEASE_TABLET | Freq: Once | ORAL | Status: AC
  Administered 2019-01-03: 40 meq via ORAL
  Filled 2019-01-03: qty 2

## 2019-01-03 MED ORDER — MAGNESIUM SULFATE 2 GM/50ML IV SOLN
2.0000 g | Freq: Once | INTRAVENOUS | Status: AC
  Administered 2019-01-03: 2 g via INTRAVENOUS
  Filled 2019-01-03: qty 50

## 2019-01-03 MED ORDER — INSULIN ASPART 100 UNIT/ML ~~LOC~~ SOLN
0.0000 [IU] | Freq: Three times a day (TID) | SUBCUTANEOUS | Status: DC
  Administered 2019-01-05 (×2): 2 [IU] via SUBCUTANEOUS
  Administered 2019-01-06 – 2019-01-08 (×4): 1 [IU] via SUBCUTANEOUS
  Administered 2019-01-09: 2 [IU] via SUBCUTANEOUS
  Administered 2019-01-11 – 2019-01-13 (×5): 1 [IU] via SUBCUTANEOUS

## 2019-01-03 MED ORDER — FUROSEMIDE 40 MG PO TABS
40.0000 mg | ORAL_TABLET | Freq: Every day | ORAL | Status: DC
  Administered 2019-01-04 – 2019-01-05 (×2): 40 mg via ORAL
  Filled 2019-01-03 (×2): qty 1

## 2019-01-03 NOTE — Progress Notes (Signed)
Progress Note  Patient Name: Teresa [name] Date of Encounter: 01/03/2019  Primary Cardiologist: Minus Breeding, MD   Subjective   Resting comfortably in bed today. Feels that breathing is a little tight, doesn't remember what it was like yesterday. No chest pain. Slept well. No concerns for me today.  Inpatient Medications    Scheduled Meds: . allopurinol  100 mg Oral Daily  . amiodarone  200 mg Oral Daily  . apixaban  5 mg Oral BID  . busPIRone  5 mg Oral BID  . carvedilol  3.125 mg Oral BID WC  . citalopram  20 mg Oral Daily  . colchicine  0.6 mg Oral Daily  . feeding supplement  1 Container Oral TID BM  . feeding supplement (ENSURE ENLIVE)  237 mL Oral BID BM  . [START ON 01/04/2019] furosemide  40 mg Oral Daily  . Gerhardt's butt cream   Topical TID  . insulin aspart  0-9 Units Subcutaneous Q4H  . levothyroxine  100 mcg Oral Q0600  . mirtazapine  7.5 mg Oral QHS   Continuous Infusions: . magnesium sulfate bolus IVPB 2 g (01/03/19 1215)   PRN Meds: HYDROcodone-acetaminophen, metoprolol tartrate, polyethylene glycol   Vital Signs    Vitals:   01/03/19 0354 01/03/19 0413 01/03/19 0727 01/03/19 0944  BP:  (!) 102/56 115/73 (!) 102/49  Pulse:      Resp:  (!) 21 19 18   Temp:  97.6 F (36.4 C) 97.6 F (36.4 C)   TempSrc:  Oral Oral   SpO2:  94% 93% 98%  Weight: 104.2 kg     Height:        Intake/Output Summary (Last 24 hours) at 01/03/2019 1257 Last data filed at 01/03/2019 0900 Gross per 24 hour  Intake 720 ml  Output 2450 ml  Net -1730 ml   Last 3 Weights 01/03/2019 12/31/2018 12/30/2018  Weight (lbs) 229 lb 11.5 oz 219 lb 5.7 oz 219 lb 2.2 oz  Weight (kg) 104.2 kg 99.5 kg 99.4 kg      Telemetry    Atrial fibrillation with occasional PVCs - Personally Reviewed  ECG    No new since 7/15 - Personally Reviewed  Physical Exam   GEN: No acute distress.  Resting comfortably in bed. Very hard of hearing. Neck: JVD difficult to visualize. Cardiac:  irregularly irregular, no murmurs, rubs, or gallops.  Respiratory: Clear to auscultation bilaterally, though diminished effort. GI: Soft, nontender, non-distended  MS: Bilateral LE edema, though not completely pitting. Slightly worse on R vs left, about 1+ pitting Neuro:  Nonfocal  Psych: Normal affect   Labs    High Sensitivity Troponin:   Recent Labs  Lab 12/28/18 1000 12/28/18 1220  TROPONINIHS 49* 31*      Cardiac EnzymesNo results for input(s): TROPONINI in the last 168 hours. No results for input(s): TROPIPOC in the last 168 hours.   Chemistry Recent Labs  Lab 12/30/18 1011  12/31/18 1127 01/01/19 0416 01/02/19 1026 01/03/19 0705  NA 139   < >  --  140 139 138  K 4.1   < >  --  3.1* 3.4* 3.8  CL 104   < >  --  101 98 99  CO2 19*   < >  --  27 30 30   GLUCOSE 116*   < >  --  105* 197* 99  BUN 62*   < >  --  45* 34* 34*  CREATININE 4.02*   < >  --  2.33* 1.81* 1.60*  CALCIUM 8.2*   < >  --  7.9* 8.5* 7.8*  PROT 4.7*  --  4.9*  --   --  4.4*  ALBUMIN 2.6*  --  2.6*  --   --  2.2*  AST 357*  --  268*  --   --  58*  ALT 317*  --  294*  --   --  48*  ALKPHOS 151*  --  142*  --   --  149*  BILITOT 2.1*  --  1.9*  --   --  1.7*  GFRNONAA 10*   < >  --  19* 26* 31*  GFRAA 12*   < >  --  22* 30* 35*  ANIONGAP 16*   < >  --  12 11 9    < > = values in this interval not displayed.     Hematology Recent Labs  Lab 01/01/19 0416 01/02/19 0704 01/03/19 0705  WBC 8.1 7.1 7.3  RBC 4.47 4.17 3.88  HGB 13.6 13.0 11.9*  HCT 41.6 41.3 36.5  MCV 93.1 99.0 94.1  MCH 30.4 31.2 30.7  MCHC 32.7 31.5 32.6  RDW 15.5 16.1* 15.7*  PLT 97* 110* 92*    BNPNo results for input(s): BNP, PROBNP in the last 168 hours.   DDimer No results for input(s): DDIMER in the last 168 hours.   Radiology    No results found.  Cardiac Studies   Echo 12/29/18  1. The left ventricle has severely reduced systolic function, with an ejection fraction of 20-25%. The cavity size was normal. Left  ventricular diastolic function could not be evaluated secondary to atrial fibrillation.  2. The right ventricle has low normal systolic function. The cavity was moderately enlarged. There is no increase in right ventricular wall thickness.  3. There is interventricular flattening during diastolic indicative of RV volume overload.  4. Left atrial size was mild-moderately dilated.  5. The LA appears larger than the biplane measurements suggest.  6. Right atrial size was moderately dilated.  7. Trivial pericardial effusion is present.  8. Mitral valve regurgitation is moderate by color flow Doppler. No evidence of mitral valve stenosis.  9. Tricuspid valve regurgitation is moderate-severe. 10. Aortic valve regurgitation is trivial by color flow Doppler. No stenosis of the aortic valve. 11. The aortic root and ascending aorta are normal in size and structure. 12. The inferior vena cava was normal in size with <50% respiratory variability. 13. The interatrial septum was not assessed.  Patient Profile     78 y.o. female with PMH chronic systolic heart failure (EF was 35-40% on TTE, 40-45% on TEE), moderate-severe MR, mild-moderate TR, mild AI, persistent atrial fibrillation, type II diabetes, chronic kidney disease, hypothyroidism who is followed at the request of Dr. Lorane Gell. Cathlean Sauer for acute on chronic heart failure and hypotension (hypotension now resolved).  Assessment & Plan    Acute on chronic systolic heart failure: updated echo shows EF drop to 20-25%, biatrial enlargement, moderate MR, moderate-severe TR. -diuresing with 40 mg IV BID -wt today 104.2 kg from 94.3 kg on admission. Unclear if this is true. Her I/O are inaccurate due to uncharted urine. She did receive significant volume repletion on admission. -on carvedilol 3.125 mg BID for cardiomyopathy and rate control -no ACEI/ARB/ARNI/MRA given her presentation with acute renal failure. Her Cr is improving, and if it remains stable  with diuresis we may be able to add an agent if her blood pressure allows. Need to  be cautious as she presented with hypotension. -Cr 1.81 today, downtrending from 4.82 on admission. K 3.4. Recommend repletion (gentle) given continued diuresis, but had elevated K on presentation with her acute renal injury. -troponin trend consistent with demand ischemia from hypotension/HF. Not consistent with ACS -she does have bilateral LE edema. Issue will be that with her EF and her TR, she will be very prone to volume overload. Would consider another day of IV diuretics, make sure her weights are accurate before changing to oral. -added fluid restriction today at 1.5L daily. Would absolutely keep below 2L, discussed this with her.  Persistent atrial fibrillation: -CHA2DS2/VAS Stroke Risk Points= 6 -on apixaban for anticoagulation -on amiodarone -digoxin added for tachycardia, not continued. Can use PRN  Acute on chronic renal insufficiency: Improving today, near baseline -management per primary team -right kidney lesion needs 6 mos follow up with PCP post discharge.   For questions or updates, please contact [place] Please consult [email or web address] for contact info under     Signed, Buford Dresser, MD  01/03/2019, 12:57 PM

## 2019-01-03 NOTE — Progress Notes (Signed)
PROGRESS NOTE    Teresa [name]  IOX:[id number] DOB: [birth year] DOA: 12/28/2018 PCP: [name], MD    Brief Narrative:  78 year old female who presented with progressive fatigue and difficulty walking.  She does have significant past medical history for hypertension, type 2 diabetes mellitus, coronary artery disease, systolic heart failure, atrial fibrillation and depression.  She had a recent family loss, death of her daughter.  Since then she had a progressive decrease in her p.o. intake, associated with generalized weakness, rapid deterioration of the last 2 to 3 days prior to hospitalization, her urine has been dark and decreased in amount.  On her initial physical examination her blood pressure was 89/76, 104/60 heart rate 90, respiratory rate 21, oxygen saturation 93%.  She was ill looking appearing, lethargic, her lungs were clear to auscultation bilaterally, heart S1-S2 present and rhythmic, her abdomen was soft, no lower extremity edema. Sodium 136, potassium 5.8, chloride 97, bicarb 16, glucose 37, BUN 62, creatinine 4.8, AST 405, ALT 287, white count 9.3, hemoglobin 16.1, hematocrit 51.9, platelets 213, SARS COVID-19 was negative.  Her chest radiograph had cardiomegaly, no infiltrates.  EKG 90 bpm, left axis deviation and right bundle branch block, atrial fibrillation rhythm, no significant ST segment or T wave changes.  Patient was admitted to the hospital working diagnosis of hypotension complicated by acute kidney injury and hypoglycemia/ to rule out sepsis.   Assessment & Plan:   Principal Problem:   Hypotension due to hypovolemia Active Problems:   Nonischemic cardiomyopathy (HCC)   Malaise and fatigue   Essential (primary) hypertension   Chronic systolic CHF (congestive heart failure), NYHA class 2 (HCC)   Moderate mitral regurgitation   Diabetes mellitus type 2, diet-controlled (HCC)   Hypothyroidism   Anxiety and depression   Acute renal failure (ARF) (HCC)  Atrial fibrillation (HCC)   Abnormal transaminases   Palliative care encounter   1. Acute on chronic systolic heart failure decompensation/ LV EF 20 to 25%/ acute hypoxic respiratory failure due to acute cardiogenic pulmonary edema. Patient has been placed on furosemide with good response, urine output over last 24 H is 2,550 ml, stable blood pressure, with systolic 268 to 341 mmHg. Oxygenating well 93 to 98%. Suspected non ischemic cardiomyopathy. Continue heart failure management with carvedilol. Will consider transition to oral furosemide.   2. AKI on CKD stage 3/ hypokalemia and hypomagnesemia. Continue to improve renal function, with serum cr at 1,60 with K at 3,8, Mg at 1,4 and serum bicarbonate at 30. Will follow on renal panel in am, avoid hypotension or nephrotoxic medications. Continue K and Mg correction with 40 meq Kck and 2 grams of Mg.   3. Chronic atrial fibrillation. Continue rate control with carvedilol and anticoagulation with apixaban. Continue telemetry monitoring. Continue amiodarone. (had one dose of digoxin)  4. Hypothyroid. Continue levothyroxine.   5. Acute gout flare. Improved pain, continue colchicine and allopurinol.   6. Obesity. Calculated BMI is 40,6.   7. T2DM. Continue glucose cover and monitoring with insulin sliding scale.   8. Depression. No confusion or agitation, continue mirtazapine and citalopram.   DVT prophylaxis: apixaban   Code Status: full Family Communication: no family at the bedside  Disposition Plan/ discharge barriers: Transfer to cardiac telemetry unit.   Body mass index is 40.69 kg/m. Malnutrition Type:  Nutrition Problem: Inadequate oral intake Etiology: (altered mental status)   Malnutrition Characteristics:  Signs/Symptoms: per patient/family report   Nutrition Interventions:  Interventions: Boost Breeze  RN Pressure Injury Documentation:  Consultants:   Cardiology   PCCM   Procedures:      Antimicrobials:       Subjective: Patient is feeling better, no nausea or vomiting, dyspnea continue to improve but not yet back to baseline.   Objective: Vitals:   01/03/19 0354 01/03/19 0413 01/03/19 0727 01/03/19 0944  BP:  (!) 102/56 115/73 (!) 102/49  Pulse:      Resp:  (!) 21 19 18   Temp:  97.6 F (36.4 C) 97.6 F (36.4 C)   TempSrc:  Oral Oral   SpO2:  94% 93% 98%  Weight: 104.2 kg     Height:        Intake/Output Summary (Last 24 hours) at 01/03/2019 0955 Last data filed at 01/03/2019 0900 Gross per 24 hour  Intake 1300 ml  Output 2450 ml  Net -1150 ml   Filed Weights   12/30/18 0325 12/31/18 0658 01/03/19 0354  Weight: 99.4 kg 99.5 kg 104.2 kg    Examination:   General: deconditioned and ill looking appearing  Neurology: Awake and alert, non focal  E ENT: mild pallor, no icterus, oral mucosa moist Cardiovascular: No JVD. S1-S2 present, rhythmic, no gallops, rubs, no murmurs. ++ non pitting lower extremity edema. Pulmonary: positive breath sounds bilaterally, no wheezing or rhonchi, bilateral rales. Gastrointestinal. Abdomen with, no organomegaly, non tender, no rebound or guarding Skin. No rashes Musculoskeletal: no joint deformities     Data Reviewed: I have personally reviewed following labs and imaging studies  CBC: Recent Labs  Lab 12/28/18 1642  12/30/18 1011 12/30/18 2317 12/31/18 0507 01/01/19 0416 01/02/19 0704 01/03/19 0705  WBC 10.6*   < > 5.6  --  6.3 8.1 7.1 7.3  NEUTROABS 7.6  --   --   --   --   --   --   --   HGB 14.6   < > 13.3 14.1 14.2 13.6 13.0 11.9*  HCT 46.5*   < > 40.6 41.9 43.9 41.6 41.3 36.5  MCV 98.1   < > 93.3  --  95.2 93.1 99.0 94.1  PLT 179   < > PLATELET CLUMPS NOTED ON SMEAR, UNABLE TO ESTIMATE  --  PLATELET CLUMPS NOTED ON SMEAR, COUNT APPEARS DECREASED 97* 110* 92*   < > = values in this interval not displayed.   Basic Metabolic Panel: Recent Labs  Lab 12/29/18 0612  12/30/18 1011 12/31/18 0507 01/01/19  0416 01/02/19 1026 01/03/19 0705  NA  --    < > 139 140 140 139 138  K  --    < > 4.1 3.7 3.1* 3.4* 3.8  CL  --    < > 104 103 101 98 99  CO2  --    < > 19* 23 27 30 30   GLUCOSE  --    < > 116* 101* 105* 197* 99  BUN  --    < > 62* 56* 45* 34* 34*  CREATININE  --    < > 4.02* 3.11* 2.33* 1.81* 1.60*  CALCIUM  --    < > 8.2* 7.9* 7.9* 8.5* 7.8*  MG 1.9  --  1.9  --   --   --  1.4*   < > = values in this interval not displayed.   GFR: Estimated Creatinine Clearance: 33.4 mL/min (A) (by C-G formula based on SCr of 1.6 mg/dL (H)). Liver Function Tests: Recent Labs  Lab 12/28/18 1000 12/30/18 1011 12/31/18 1127 01/03/19 0705  AST 405* 357* 268* 58*  ALT 287* 317* 294* 48*  ALKPHOS 269* 151* 142* 149*  BILITOT 2.9* 2.1* 1.9* 1.7*  PROT 6.6 4.7* 4.9* 4.4*  ALBUMIN 3.9 2.6* 2.6* 2.2*   No results for input(s): LIPASE, AMYLASE in the last 168 hours. Recent Labs  Lab 12/30/18 1420 12/31/18 0507  AMMONIA 91* 36*   Coagulation Profile: No results for input(s): INR, PROTIME in the last 168 hours. Cardiac Enzymes: No results for input(s): CKTOTAL, CKMB, CKMBINDEX, TROPONINI in the last 168 hours. BNP (last 3 results) No results for input(s): PROBNP in the last 8760 hours. HbA1C: No results for input(s): HGBA1C in the last 72 hours. CBG: Recent Labs  Lab 01/02/19 1616 01/02/19 1937 01/02/19 2331 01/03/19 0338 01/03/19 0723  GLUCAP 153* 153* 130* 134* 96   Lipid Profile: No results for input(s): CHOL, HDL, LDLCALC, TRIG, CHOLHDL, LDLDIRECT in the last 72 hours. Thyroid Function Tests: No results for input(s): TSH, T4TOTAL, FREET4, T3FREE, THYROIDAB in the last 72 hours. Anemia Panel: No results for input(s): VITAMINB12, FOLATE, FERRITIN, TIBC, IRON, RETICCTPCT in the last 72 hours.    Radiology Studies: I have reviewed all of the imaging during this hospital visit personally     Scheduled Meds: . allopurinol  100 mg Oral Daily  . amiodarone  200 mg Oral Daily   . apixaban  5 mg Oral BID  . busPIRone  5 mg Oral BID  . carvedilol  3.125 mg Oral BID WC  . citalopram  20 mg Oral Daily  . colchicine  0.6 mg Oral Daily  . famotidine  20 mg Oral QHS  . feeding supplement  1 Container Oral TID BM  . feeding supplement (ENSURE ENLIVE)  237 mL Oral BID BM  . furosemide  40 mg Intravenous BID  . Gerhardt's butt cream   Topical TID  . insulin aspart  0-9 Units Subcutaneous Q4H  . levothyroxine  100 mcg Oral Q0600  . mirtazapine  7.5 mg Oral QHS   Continuous Infusions:   LOS: 6 days        Clarivel Callaway Gerome Apley, MD

## 2019-01-03 NOTE — Progress Notes (Signed)
Physical Therapy Treatment Patient Details Name: Teresa [name] MRN: [id number] DOB: 1941/04/24 Today's Date: 01/03/2019    History of Present Illness Pt adm with weakness, hypotension, acute on chronic renal failure. 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 PMH - afib, chf, anxiety, dm, cad,     PT Comments    Pt making progress with mobility but is now limited by rt foot pain due to gout. Expect mobility will continue to improve as gout improves. Pt will need 24 hour physical assist at dc. If family unable to provide that at home will need ST-SNF.    Follow Up Recommendations  Home health PT;Supervision/Assistance - 24 hour(if family can't provide 24 hour assist will need SNF)     Equipment Recommendations  Other (comment)(To be determined)    Recommendations for Other Services       Precautions / Restrictions Precautions Precautions: Fall Restrictions Weight Bearing Restrictions: No    Mobility  Bed Mobility Overal bed mobility: Needs Assistance Bed Mobility: Supine to Sit;Sit to Supine     Supine to sit: Total assist;+2 for physical assistance Sit to supine: Total assist;+2 for physical assistance   General bed mobility comments: Assist to bring legs off of bed, elevate trunk into sitting, and bring hips to EOB. Assist to lower trunk and bring legs back up into bed.   Transfers Overall transfer level: Needs assistance Equipment used: Rolling walker (2 wheeled) Transfers: Sit to/from Stand Sit to Stand: Mod assist;+2 physical assistance         General transfer comment: Assist to bring hips up and for balance. Verbal cues for hand and foot placement. Verbal and tactile cues to fully extend trunk and hips. Performed x 3. Pt unable to make any pivotal steps due to rt foot pain.  Ambulation/Gait             General Gait Details: Unable to take any steps due to rt foot pain   Stairs             Wheelchair Mobility    Modified Rankin (Stroke  Patients Only)       Balance Overall balance assessment: Needs assistance Sitting-balance support: No upper extremity supported;Feet supported Sitting balance-Leahy Scale: Fair     Standing balance support: Bilateral upper extremity supported Standing balance-Leahy Scale: Poor Standing balance comment: Pt stood x 3 with walker with +2 min assist to maintain. Stood 30-45 sec                            Cognition Arousal/Alertness: Awake/alert Behavior During Therapy: WFL for tasks assessed/performed Overall Cognitive Status: No family/caregiver present to determine baseline cognitive functioning Area of Impairment: Following commands;Safety/judgement;Problem solving                       Following Commands: Follows multi-step commands inconsistently;Follows multi-step commands with increased time Safety/Judgement: Decreased awareness of safety;Decreased awareness of deficits   Problem Solving: Slow processing;Requires verbal cues;Requires tactile cues General Comments: Pt with improved alertness and cognition since PT eval       Exercises      General Comments General comments (skin integrity, edema, etc.): VSS      Pertinent Vitals/Pain Pain Assessment: Faces Faces Pain Scale: Hurts even more Pain Location: R foot especially big toe Pain Descriptors / Indicators: Constant;Sore;Grimacing;Guarding;Moaning Pain Intervention(s): Limited activity within patient's tolerance;Monitored during session;Premedicated before session;Repositioned    Home Living  Prior Function            PT Goals (current goals can now be found in the care plan section) Acute Rehab PT Goals Patient Stated Goal: to walk again Progress towards PT goals: Progressing toward goals    Frequency    Min 3X/week      PT Plan Current plan remains appropriate    Co-evaluation PT/OT/SLP Co-Evaluation/Treatment: Yes Reason for Co-Treatment: For  patient/therapist safety PT goals addressed during session: Mobility/safety with mobility;Balance        AM-PAC PT "6 Clicks" Mobility   Outcome Measure  Help needed turning from your back to your side while in a flat bed without using bedrails?: Total Help needed moving from lying on your back to sitting on the side of a flat bed without using bedrails?: Total Help needed moving to and from a bed to a chair (including a wheelchair)?: Total Help needed standing up from a chair using your arms (e.g., wheelchair or bedside chair)?: Total Help needed to walk in hospital room?: Total Help needed climbing 3-5 steps with a railing? : Total 6 Click Score: 6    End of Session Equipment Utilized During Treatment: Gait belt Activity Tolerance: Patient limited by pain Patient left: in bed;with call bell/phone within reach;with bed alarm set Nurse Communication: Mobility status;Other (comment)(Purewick needs to be replaced) PT Visit Diagnosis: Other abnormalities of gait and mobility (R26.89);Pain Pain - Right/Left: Right Pain - part of body: Ankle and joints of foot     Time: 9937-1696 PT Time Calculation (min) (ACUTE ONLY): 23 min  Charges:  $Therapeutic Activity: 8-22 mins                     Alvan Pager [phone] Office [place] 01/03/2019, 1:01 PM

## 2019-01-03 NOTE — Progress Notes (Signed)
I happened to enter the room while she is receiving personal care.  I am able to assist the RN.  Patient is alert and oriented.  We talked about goals of care and CODE STATUS.  Patient reiterates again that if she is unable to be awake, conversant with her family, and able to move around somewhat-then she does not want to be maintained.  "I do not want to be kept alive just to be alive. "She states she would never want a feeding tube.  I commented to her that her son [name] knows her wishes but states she should remain a full code.  Ms. Keeven comments that [name] does not want to lose her.  I asked her to consider having a conversation with him at home about her wishes and she agreed that she would.  At this point Livy is improving.  She feels better, her labs are better, she is looking forward to going home.  I spoke with [name] on the phone and reiterated the conversation with Mrs. Palin and her wishes.  [name] indicated that he understood.  I asked if he wanted palliative care to follow her at home.  [name] stated that he has had some many questions thrown at him in the past 24 hours he was overwhelmed and about to be in the bed next to his mother.  I told him there was no pressure but the service was available to him if he wanted it.  He stated he wanted his mother to have the best care possible.  I am concerned that [name] will be very overwhelmed with caring for his mother when she comes home.  Assessment: Precious 78 year old female, very fragile with advanced heart failure and kidney failure.  Very deconditioned, poor nutrition.  At very high risk of rehospitalization  Recommendation:  Continue current treatment  Discharge to home with full home health services and palliative care when ready.  Palliative will follow at a distance for the rest of the hospitalization.  If we are needed please do not hesitate to call our office [phone].  Florentina Jenny, PA-C Palliative Medicine Pager:  [phone]  25 minutes

## 2019-01-03 NOTE — Progress Notes (Signed)
Occupational Therapy Treatment Patient Details Name: Teresa [name] MRN: [id number] DOB: 1940-11-16 Today's Date: 01/03/2019    History of present illness Pt adm with weakness, hypotension, acute on chronic renal failure. PMH - afib, chf, anxiety, dm, cad,    OT comments  Pt progressing toward established goals. Pt currently requires minA for UB ADL and modA+2 for sit<>stand transfer 3x from EOB. Pt unable to tolerate transfer to recliner this session secondary to pain in R foot. Pt will continue to benefit from skilled OT services to maximize safety and independence with ADL/IADL and functional mobility. Will continue to follow acutely and progress as tolerated.     Follow Up Recommendations  SNF;Home health OT;Supervision/Assistance - 24 hour    Equipment Recommendations  None recommended by OT    Recommendations for Other Services      Precautions / Restrictions Precautions Precautions: Fall Restrictions Weight Bearing Restrictions: No       Mobility Bed Mobility Overal bed mobility: Needs Assistance Bed Mobility: Supine to Sit;Sit to Supine     Supine to sit: Total assist;+2 for physical assistance Sit to supine: Total assist;+2 for physical assistance   General bed mobility comments: assist for BLE management and trunk management  Transfers Overall transfer level: Needs assistance Equipment used: Rolling walker (2 wheeled) Transfers: Sit to/from Stand Sit to Stand: Mod assist;+2 physical assistance         General transfer comment: sit<>stand from EOB 3x with modA+2 and vc to maintain upright posture in standing;unable to tolerate pivot to recliner this date    Balance Overall balance assessment: Needs assistance Sitting-balance support: No upper extremity supported;Feet supported Sitting balance-Leahy Scale: Fair     Standing balance support: Bilateral upper extremity supported Standing balance-Leahy Scale: Poor Standing balance comment: requires BUE  support in standing                            ADL either performed or assessed with clinical judgement   ADL Overall ADL's : Needs assistance/impaired     Grooming: Minimal assistance;Brushing hair;Sitting;Wash/dry face Grooming Details (indicate cue type and reason): pt required minA for thorough completion of task;pt sitting EOB during ADL                             Functional mobility during ADLs: +2 for physical assistance;Rolling walker;Maximal assistance General ADL Comments: pt unable to tolerate stand-pivot transfer to recliner this session due to increased pain in R foot;pt able to tolerate sitting EOB for ADL completion     Vision       Perception     Praxis      Cognition Arousal/Alertness: Awake/alert Behavior During Therapy: WFL for tasks assessed/performed Overall Cognitive Status: No family/caregiver present to determine baseline cognitive functioning Area of Impairment: Following commands;Safety/judgement;Problem solving                       Following Commands: Follows multi-step commands inconsistently;Follows multi-step commands with increased time Safety/Judgement: Decreased awareness of safety;Decreased awareness of deficits   Problem Solving: Slow processing;Requires verbal cues;Requires tactile cues General Comments: pt requires vc for safe hand placement;required vc for task initiation        Exercises     Shoulder Instructions       General Comments VSS throughout;noted increased edema in LUE, elevated BUE and educated pt on importance of mobility, elevation and ROM  for edema managament    Pertinent Vitals/ Pain       Pain Assessment: Faces Faces Pain Scale: Hurts little more Pain Location: R foot especially big toe Pain Descriptors / Indicators: Constant;Sore;Grimacing;Guarding;Moaning Pain Intervention(s): Limited activity within patient's tolerance;Monitored during session  Home Living                                           Prior Functioning/Environment              Frequency  Min 2X/week        Progress Toward Goals  OT Goals(current goals can now be found in the care plan section)  Progress towards OT goals: Progressing toward goals  Acute Rehab OT Goals Patient Stated Goal: to walk again OT Goal Formulation: With patient Time For Goal Achievement: 01/14/19 Potential to Achieve Goals: Good ADL Goals Pt Will Perform Grooming: with modified independence;sitting;standing Pt Will Perform Upper Body Dressing: with modified independence;sitting;standing Pt Will Perform Lower Body Dressing: sit to/from stand;with modified independence Pt Will Transfer to Toilet: with modified independence;ambulating;bedside commode Pt Will Perform Toileting - Clothing Manipulation and hygiene: with modified independence;sit to/from stand  Plan Discharge plan remains appropriate    Co-evaluation    PT/OT/SLP Co-Evaluation/Treatment: Yes Reason for Co-Treatment: For patient/therapist safety;To address functional/ADL transfers          AM-PAC OT "6 Clicks" Daily Activity     Outcome Measure   Help from another person eating meals?: A Little Help from another person taking care of personal grooming?: A Little Help from another person toileting, which includes using toliet, bedpan, or urinal?: A Lot Help from another person bathing (including washing, rinsing, drying)?: A Lot Help from another person to put on and taking off regular upper body clothing?: A Little Help from another person to put on and taking off regular lower body clothing?: A Lot 6 Click Score: 15    End of Session Equipment Utilized During Treatment: Gait belt;Rolling walker  OT Visit Diagnosis: Unsteadiness on feet (R26.81);Other abnormalities of gait and mobility (R26.89);Muscle weakness (generalized) (M62.81);Feeding difficulties (R63.3);Other symptoms and signs involving cognitive  function   Activity Tolerance Patient tolerated treatment well;Patient limited by pain   Patient Left in bed;with call bell/phone within reach;with bed alarm set   Nurse Communication Mobility status        Time: 1660-6301 OT Time Calculation (min): 24 min  Charges: OT General Charges $OT Visit: 1 Visit OT Treatments $Self Care/Home Management : 8-22 mins  Dorinda Hill OTR/L [place] Office: [place] 01/03/2019, 12:34 PM

## 2019-01-03 NOTE — Progress Notes (Signed)
CSW received a phone call back from [place] with [facility] [employer]. She spoke with the patient's son and they are not able to afford private duty nurses for his mother during the day.   Without the patient having support at home it would not be safe for her to discharge home. CSW will follow up with the patient's son about discharge and disposition planning.   Domenic Schwab, MSW, [place] Worker [facility]  [phone]

## 2019-01-04 ENCOUNTER — Inpatient Hospital Stay (HOSPITAL_COMMUNITY): Payer: Medicare Other

## 2019-01-04 DIAGNOSIS — I4891 Unspecified atrial fibrillation: Secondary | ICD-10-CM

## 2019-01-04 LAB — GLUCOSE, CAPILLARY
Glucose-Capillary: 146 mg/dL — ABNORMAL HIGH (ref 70–99)
Glucose-Capillary: 152 mg/dL — ABNORMAL HIGH (ref 70–99)
Glucose-Capillary: 118 mg/dL — ABNORMAL HIGH (ref 70–99)
Glucose-Capillary: 181 mg/dL — ABNORMAL HIGH (ref 70–99)

## 2019-01-04 LAB — CBC
HCT: 45.3 % (ref 36.0–46.0)
Hemoglobin: 14.4 g/dL (ref 12.0–15.0)
MCH: 30.4 pg (ref 26.0–34.0)
MCHC: 31.8 g/dL (ref 30.0–36.0)
MCV: 95.8 fL (ref 80.0–100.0)
Platelets: 152 10*3/uL (ref 150–400)
RBC: 4.73 MIL/uL (ref 3.87–5.11)
RDW: 16 % — ABNORMAL HIGH (ref 11.5–15.5)
WBC: 11.6 10*3/uL — ABNORMAL HIGH (ref 4.0–10.5)
nRBC: 0 % (ref 0.0–0.2)

## 2019-01-04 LAB — BASIC METABOLIC PANEL
Anion gap: 10 (ref 5–15)
BUN: 36 mg/dL — ABNORMAL HIGH (ref 8–23)
CO2: 27 mmol/L (ref 22–32)
Calcium: 8.6 mg/dL — ABNORMAL LOW (ref 8.9–10.3)
Chloride: 98 mmol/L (ref 98–111)
Creatinine, Ser: 1.65 mg/dL — ABNORMAL HIGH (ref 0.44–1.00)
GFR calc Af Amer: 34 mL/min — ABNORMAL LOW (ref >60)
GFR calc non Af Amer: 29 mL/min — ABNORMAL LOW (ref >60)
Glucose, Bld: 138 mg/dL — ABNORMAL HIGH (ref 70–99)
Potassium: 4.5 mmol/L (ref 3.5–5.1)
Sodium: 135 mmol/L (ref 135–145)

## 2019-01-04 LAB — MAGNESIUM: Magnesium: 1.7 mg/dL (ref 1.7–2.4)

## 2019-01-04 MED ORDER — SODIUM CHLORIDE 0.9% FLUSH
10.0000 mL | INTRAVENOUS | Status: DC | PRN
  Administered 2019-01-09: 10 mL
  Filled 2019-01-04: qty 40

## 2019-01-04 MED ORDER — METOPROLOL TARTRATE 12.5 MG HALF TABLET
12.5000 mg | ORAL_TABLET | Freq: Four times a day (QID) | ORAL | Status: DC
  Administered 2019-01-04 – 2019-01-06 (×8): 12.5 mg via ORAL
  Filled 2019-01-04 (×8): qty 1

## 2019-01-04 MED ORDER — FUROSEMIDE 10 MG/ML IJ SOLN
40.0000 mg | Freq: Once | INTRAMUSCULAR | Status: AC
  Administered 2019-01-04: 40 mg via INTRAMUSCULAR
  Filled 2019-01-04: qty 4

## 2019-01-04 MED ORDER — MAGNESIUM OXIDE 400 (241.3 MG) MG PO TABS
200.0000 mg | ORAL_TABLET | Freq: Two times a day (BID) | ORAL | Status: DC
  Administered 2019-01-04 – 2019-01-13 (×18): 200 mg via ORAL
  Filled 2019-01-04 (×18): qty 1

## 2019-01-04 MED ORDER — FUROSEMIDE 10 MG/ML IJ SOLN: 40.0000 mg | Freq: Once | INTRAMUSCULAR | Status: DC

## 2019-01-04 MED ORDER — FUROSEMIDE 10 MG/ML IJ SOLN
40.0000 mg | Freq: Once | INTRAMUSCULAR | Status: AC
  Administered 2019-01-04: 40 mg via INTRAVENOUS
  Filled 2019-01-04: qty 4

## 2019-01-04 NOTE — Progress Notes (Signed)
Progress Note  Patient Name: Teresa [name] Date of Encounter: 01/04/2019  Primary Cardiologist: Minus Breeding, MD   Subjective   Sitting in chair this AM. Thinks she feels better. Asking the same questions as yesterday re: fluid restriction, etc. No chest pain. No SOB when sitting still.  Inpatient Medications    Scheduled Meds: . allopurinol  100 mg Oral Daily  . amiodarone  200 mg Oral Daily  . apixaban  5 mg Oral BID  . busPIRone  5 mg Oral BID  . citalopram  20 mg Oral Daily  . colchicine  0.6 mg Oral Daily  . feeding supplement  1 Container Oral TID BM  . furosemide  40 mg Oral Daily  . Gerhardt's butt cream   Topical TID  . insulin aspart  0-9 Units Subcutaneous TID WC  . levothyroxine  100 mcg Oral Q0600  . metoprolol tartrate  12.5 mg Oral Q6H  . mirtazapine  7.5 mg Oral QHS   Continuous Infusions:  PRN Meds: HYDROcodone-acetaminophen, lip balm, metoprolol tartrate, polyethylene glycol   Vital Signs    Vitals:   01/03/19 1606 01/03/19 2336 01/04/19 0500 01/04/19 0916  BP: 113/71 (!) 132/99  119/87  Pulse: 82 (!) 119  (!) 129  Resp:  (!) 24  16  Temp: 97.7 F (36.5 C) 97.8 F (36.6 C)  (!) 97.5 F (36.4 C)  TempSrc: Oral Axillary  Oral  SpO2: 92% 96%  98%  Weight:   104.2 kg   Height:        Intake/Output Summary (Last 24 hours) at 01/04/2019 1156 Last data filed at 01/04/2019 1000 Gross per 24 hour  Intake 1250 ml  Output 1100 ml  Net 150 ml   Last 3 Weights 01/04/2019 01/03/2019 12/31/2018  Weight (lbs) 229 lb 11.5 oz 229 lb 11.5 oz 219 lb 5.7 oz  Weight (kg) 104.2 kg 104.2 kg 99.5 kg      Telemetry     afib with gradually increasing rates to the 120s- Personally Reviewed  ECG    No new since 7/15 - Personally Reviewed  Physical Exam   GEN: Well nourished, well developed in no acute distress. Frail appearing, very hard of hearing HEENT: Normal NECK: JVD difficult to assess 2/2 body habitus LYMPHATICS: No lymphadenopathy CARDIAC:  irregularly irregular rhythm, normal S1 and S2, no murmurs, rubs, gallops. Radial and DP pulses 2+ bilaterally. RESPIRATORY:  Clear to auscultation without rales, wheezing or rhonchi  ABDOMEN: Soft, non-tender, non-distended MUSCULOSKELETAL:  Bilateral pitting and nonpitting edema edema; No deformity  SKIN: Warm and dry NEUROLOGIC:  Alert and oriented x 3 PSYCHIATRIC:  Normal affect   Labs    High Sensitivity Troponin:   Recent Labs  Lab 12/28/18 1000 12/28/18 1220  TROPONINIHS 49* 31*      Cardiac EnzymesNo results for input(s): TROPONINI in the last 168 hours. No results for input(s): TROPIPOC in the last 168 hours.   Chemistry Recent Labs  Lab 12/30/18 1011  12/31/18 1127  01/02/19 1026 01/03/19 0705 01/04/19 0735  NA 139   < >  --    < > 139 138 135  K 4.1   < >  --    < > 3.4* 3.8 4.5  CL 104   < >  --    < > 98 99 98  CO2 19*   < >  --    < > 30 30 27   GLUCOSE 116*   < >  --    < >  197* 99 138*  BUN 62*   < >  --    < > 34* 34* 36*  CREATININE 4.02*   < >  --    < > 1.81* 1.60* 1.65*  CALCIUM 8.2*   < >  --    < > 8.5* 7.8* 8.6*  PROT 4.7*  --  4.9*  --   --  4.4*  --   ALBUMIN 2.6*  --  2.6*  --   --  2.2*  --   AST 357*  --  268*  --   --  58*  --   ALT 317*  --  294*  --   --  48*  --   ALKPHOS 151*  --  142*  --   --  149*  --   BILITOT 2.1*  --  1.9*  --   --  1.7*  --   GFRNONAA 10*   < >  --    < > 26* 31* 29*  GFRAA 12*   < >  --    < > 30* 35* 34*  ANIONGAP 16*   < >  --    < > 11 9 10    < > = values in this interval not displayed.     Hematology Recent Labs  Lab 01/02/19 0704 01/03/19 0705 01/04/19 0736  WBC 7.1 7.3 11.6*  RBC 4.17 3.88 4.73  HGB 13.0 11.9* 14.4  HCT 41.3 36.5 45.3  MCV 99.0 94.1 95.8  MCH 31.2 30.7 30.4  MCHC 31.5 32.6 31.8  RDW 16.1* 15.7* 16.0*  PLT 110* 92* 152    BNPNo results for input(s): BNP, PROBNP in the last 168 hours.   DDimer No results for input(s): DDIMER in the last 168 hours.   Radiology    No  results found.  Cardiac Studies   Echo 12/29/18  1. The left ventricle has severely reduced systolic function, with an ejection fraction of 20-25%. The cavity size was normal. Left ventricular diastolic function could not be evaluated secondary to atrial fibrillation.  2. The right ventricle has low normal systolic function. The cavity was moderately enlarged. There is no increase in right ventricular wall thickness.  3. There is interventricular flattening during diastolic indicative of RV volume overload.  4. Left atrial size was mild-moderately dilated.  5. The LA appears larger than the biplane measurements suggest.  6. Right atrial size was moderately dilated.  7. Trivial pericardial effusion is present.  8. Mitral valve regurgitation is moderate by color flow Doppler. No evidence of mitral valve stenosis.  9. Tricuspid valve regurgitation is moderate-severe. 10. Aortic valve regurgitation is trivial by color flow Doppler. No stenosis of the aortic valve. 11. The aortic root and ascending aorta are normal in size and structure. 12. The inferior vena cava was normal in size with <50% respiratory variability. 13. The interatrial septum was not assessed.  Patient Profile     78 y.o. female with PMH chronic systolic heart failure (EF was 35-40% on TTE, 40-45% on TEE), moderate-severe MR, mild-moderate TR, mild AI, persistent atrial fibrillation, type II diabetes, chronic kidney disease, hypothyroidism who is followed at the request of Dr. Lorane Gell. Cathlean Sauer for acute on chronic heart failure and hypotension (hypotension now resolved).  Assessment & Plan    Acute on chronic systolic heart failure: updated echo shows EF drop to 20-25%, biatrial enlargement, moderate MR, moderate-severe TR. -diuresing with 40 mg IV BID up to yesterday, but due to start oral  today per primary team. My concern is that her weight is supposedly still up 10 kg over her baseline at admission, and her weight at her  PCP visit from 12/01/18 was 88.5 kg. If that is her true weight, then I would continue IV lasix, especially with her trend for improved renal function with diuresis. -wt today 104.2 kg again (same as yesterday) from 94.3 kg on admission. Unclear if this is true. Would reweigh standing if possible. Her I/O are inaccurate due to uncharted urine--she had a purewick yesterday with urine in the container, but it does not appear this is charted. Very difficult to assess how she is responding to diuresis without accurate collection/documentation.  -on carvedilol 3.125 mg BID for cardiomyopathy and rate control. Rates increasing overnight. Will change to metoprolol q6 to attempt to improve rate control.  -no ACEI/ARB/ARNI/MRA currently as she presented with AKI, but as her Cr improves and BP remains stable, may be able to add low dose. Need to be cautious as she presented with hypotension. -Cr 1.60 today, downtrending from 4.82 on admission and 1.81 yesterday. K 3.8. -troponin trend consistent with demand ischemia from hypotension/HF. Not consistent with ACS -she does have bilateral LE edema. Issue will be that with her EF and her TR, she will be very prone to volume overload.  -continue fluid restriction at 1.5L daily. Would absolutely keep below 2L, discussed this with her. -her albumin is also 2.2, which makes her prone to edema.  Persistent atrial fibrillation: -CHA2DS2/VAS Stroke Risk Points= 6 -on apixaban for anticoagulation -on amiodarone. Could consider cardioversion if rates remain difficult to control. Unclear what the likelihood of success would be for this. -digoxin added for tachycardia, not continued. Can use PRN -changing carvedilol to metoprolol today for better rate control.  Acute on chronic renal insufficiency: Improving today, near baseline -management per primary team -right kidney lesion needs 6 mos follow up with PCP post discharge.   For questions or updates, please contact [place] Please consult [email or web address] for contact info under     Signed, Buford Dresser, MD  01/04/2019, 11:56 AM

## 2019-01-04 NOTE — Care Management Important Message (Signed)
Important Message  Patient Details  Name: Teresa [name] MRN: [id number] Date of Birth: 08-20-1940   Medicare Important Message Given:  Yes     [name] 01/04/2019, 3:22 PM

## 2019-01-04 NOTE — Progress Notes (Signed)
PROGRESS NOTE    Teresa [name]  EUM:[id number] DOB: [birth year] DOA: 12/28/2018 PCP: [name], MD   Brief Narrative: 78 year old with past medical history significant for hypertension, diabetes, coronary artery disease, heart failure reduced ejection fraction, A. fib, depression who was brought in because worsening weakness.  History obtained from the son: Per son patient has not been the same since July 4 weekend when patient's daughter died from congestive heart failure.  Patient has not been drinking or eating well. Patient presented with hypoglycemia, hypotensive, acute on chronic renal failure, lactic acidosis with a lactic level at 6.7.  The morning of July 15 patient noted to be more lethargic, worsening acidosis with a bicarb at 11, pH of 7.2.  CCM consulted for further evaluation.  They recommended medical management no need for transfer to the ICU.  Patient was a started on a bicarb drip, subsequently acidosis resolved.  Patient presented with hypotension and acute on chronic renal failure likely related to hypovolemia and dehydration due to poor oral intake.  During evaluation she had 1 out of 4 blood cultures positive for Staphylococcus species coagulase-negative.  She received 5 days of IV antibiotics.  Patient was also experiencing acute metabolic encephalopathy, with lethargy.  Ammonia level was elevated at 90.  She was a started on lactulose subsequently ammonia decreased to 30.  She developed diarrhea with lactulose and this has been on hold.  She presented with transaminases in the 400 range, this was thought to be related to shock liver.  Liver function test has been trending down.  Patient subsequently developed volume overload and she was a started on IV Lasix this.  She has been transitioned now to oral Lasix.  At this point in time we are trying to control her A. fib with RVR.  Her carvedilol  was changed to metoprolol 12.5 every 6 hours.  Cardiology has been helping  with medications.  Patient might be able to be discharged home soon when her heart rate is better controlled.    Assessment & Plan:   Principal Problem:   Hypotension due to hypovolemia Active Problems:   Nonischemic cardiomyopathy (HCC)   Malaise and fatigue   Essential (primary) hypertension   Chronic systolic CHF (congestive heart failure), NYHA class 2 (HCC)   Moderate mitral regurgitation   Diabetes mellitus type 2, diet-controlled (HCC)   Hypothyroidism   Anxiety and depression   Acute renal failure (ARF) (HCC)   Atrial fibrillation (HCC)   Abnormal transaminases   Palliative care encounter    1-Hypotension; resolved with IV fluids.  Sepsis rule out.  Patient presented with lactic acidosis: Lactic acid at 6. 1 out of 4 blood cultures growing staph coagulase-negative.  Repeated blood culture 7-15 no growth to date.  finished 5 days of antibiotics.  Echocardiogram EF 25 %.  Cardiology think hypotension related to hypovolemia.   2-Metabolic acidosis: This could be related to renal failure, versus sepsis. Treated with IV bicarb drip. Resolved on bicarb gtt.  3-Acute on chronic renal failure: stage III, prior cr 1.8--2 Suspect multifactorial to hypovolemia, hypotension. Continue with IV fluids. Cr on admission at 4.8. cr has decreased to 1.6.  Avoid hypotension. Renal US with small lesion, needs follow up. Son aware.  NSL fluid to avoid pulmonary edema Renal function stable. Monitor on lasix.   4-Acute metabolic encephalopathy: and hepatic encephalopathy.  Suspect multifactorial related to acidosis, hypotension component hepatic encephalopathy. . ABG with a pH of 7.2, bicarb at 11. CCM consulted for evaluation  for ICU admission. No need to transfer to ICU per CCM Ammonia elevated at 90---decreased to 36.  Hold lactulose due to frequent BM Improved.   5-1 out of 4 blood cultures positive for staph coagulase-negative: Due to presentation, lactic acidosis,  hypotension, she was treated with IV antibiotics.  Repeated blood culture no growth to date.  On Ancef. Finished 5 days. Stop antibiotics.   6-Acute on Chronic HFrEF; Plan to start IV lasix today.  Monitor renal function    7-A. fib: Continue with amiodarone. Continue with Eliquis. Per nurse some possible blood after lactulose enema.  No further blood in stool. Hb stable at 14.Marland Kitchen  Resume eliquis.  Received one dose of digoxin  Adjusting medications today, started on metoprolol 12 mg every 6 hours   8-Transaminases: Could be related to shock liver, hypotension. [place] with hepatic steatosis.  Trending down.   9-Hypomagnesemia: Replaced.   11-Hypothyroidism: Continue with Synthroid.  12-Hypoglycemia: Due to poor oral intake continue with IV fluids. Resolved.   Acute hypoxic Resp failure;  NSL fluids.  Started on IV lasix.   Right big toe Gout; Uric acid elevated.  Started  colchicine and allopurinol.  Prednisone ordered  for 3 days.  Pain improved.   Hyperkalemia: Lokelma ordered. Started bicarb drip. Received calcium gluconate. Resolved.     Estimated body mass index is 40.69 kg/m as calculated from the following:   Height as of this encounter: 5\' 3"  (1.6 m).   Weight as of this encounter: 104.2 kg.   DVT prophylaxis: Eliquis Code Status: Full code  family Communication: son over phone Disposition Plan: home when HR better controlled.   Consultants:   CCM  Cardiology  Procedures:   Ultrasound pending  Antimicrobials:  Vancomycin 7/14 Zosyn 7/15   Subjective: She is alert and oriented.  She reports right foot pain has improved.  She denies shortness of breath.  Objective: Vitals:   01/03/19 1606 01/03/19 2336 01/04/19 0500 01/04/19 0916  BP: 113/71 (!) 132/99  119/87  Pulse: 82 (!) 119  (!) 129  Resp:  (!) 24  16  Temp: 97.7 F (36.5 C) 97.8 F (36.6 C)  (!) 97.5 F (36.4 C)  TempSrc: Oral Axillary  Oral  SpO2: 92% 96%  98%  Weight:    104.2 kg   Height:        Intake/Output Summary (Last 24 hours) at 01/04/2019 1336 Last data filed at 01/04/2019 1000 Gross per 24 hour  Intake 720 ml  Output 1100 ml  Net -380 ml   Filed Weights   12/31/18 0658 01/03/19 0354 01/04/19 0500  Weight: 99.5 kg 104.2 kg 104.2 kg    Examination:  General exam: Alert and oriented following commands Respiratory system: Clear to auscultation Cardiovascular system: S1, S2 regular rhythm and rate Gastrointestinal system: Bowel sounds present soft nontender not distended Central nervous system: Alert following commands Extremities: Trace edema Skin: Multiple skin bruises  Data Reviewed: I have personally reviewed following labs and imaging studies  CBC: Recent Labs  Lab 12/28/18 1642  12/31/18 0507 01/01/19 0416 01/02/19 0704 01/03/19 0705 01/04/19 0736  WBC 10.6*   < > 6.3 8.1 7.1 7.3 11.6*  NEUTROABS 7.6  --   --   --   --   --   --   HGB 14.6   < > 14.2 13.6 13.0 11.9* 14.4  HCT 46.5*   < > 43.9 41.6 41.3 36.5 45.3  MCV 98.1   < > 95.2 93.1 99.0 94.1 95.8  PLT 179   < > PLATELET CLUMPS NOTED ON SMEAR, COUNT APPEARS DECREASED 97* 110* 92* 152   < > = values in this interval not displayed.   Basic Metabolic Panel: Recent Labs  Lab 12/29/18 0612  12/30/18 1011 12/31/18 0507 01/01/19 0416 01/02/19 1026 01/03/19 0705 01/04/19 0735  NA  --    < > 139 140 140 139 138 135  K  --    < > 4.1 3.7 3.1* 3.4* 3.8 4.5  CL  --    < > 104 103 101 98 99 98  CO2  --    < > 19* 23 27 30 30 27   GLUCOSE  --    < > 116* 101* 105* 197* 99 138*  BUN  --    < > 62* 56* 45* 34* 34* 36*  CREATININE  --    < > 4.02* 3.11* 2.33* 1.81* 1.60* 1.65*  CALCIUM  --    < > 8.2* 7.9* 7.9* 8.5* 7.8* 8.6*  MG 1.9  --  1.9  --   --   --  1.4* 1.7   < > = values in this interval not displayed.   GFR: Estimated Creatinine Clearance: 32.4 mL/min (A) (by C-G formula based on SCr of 1.65 mg/dL (H)). Liver Function Tests: Recent Labs  Lab 12/30/18 1011  12/31/18 1127 01/03/19 0705  AST 357* 268* 58*  ALT 317* 294* 48*  ALKPHOS 151* 142* 149*  BILITOT 2.1* 1.9* 1.7*  PROT 4.7* 4.9* 4.4*  ALBUMIN 2.6* 2.6* 2.2*   No results for input(s): LIPASE, AMYLASE in the last 168 hours. Recent Labs  Lab 12/30/18 1420 12/31/18 0507  AMMONIA 91* 36*   Coagulation Profile: No results for input(s): INR, PROTIME in the last 168 hours. Cardiac Enzymes: No results for input(s): CKTOTAL, CKMB, CKMBINDEX, TROPONINI in the last 168 hours. BNP (last 3 results) No results for input(s): PROBNP in the last 8760 hours. HbA1C: No results for input(s): HGBA1C in the last 72 hours. CBG: Recent Labs  Lab 01/03/19 1143 01/03/19 1604 01/03/19 1950 01/04/19 0729 01/04/19 1201  GLUCAP 172* 168* 158* 146* 152*   Lipid Profile: No results for input(s): CHOL, HDL, LDLCALC, TRIG, CHOLHDL, LDLDIRECT in the last 72 hours. Thyroid Function Tests: No results for input(s): TSH, T4TOTAL, FREET4, T3FREE, THYROIDAB in the last 72 hours. Anemia Panel: No results for input(s): VITAMINB12, FOLATE, FERRITIN, TIBC, IRON, RETICCTPCT in the last 72 hours. Sepsis Labs: Recent Labs  Lab 12/28/18 1421 12/28/18 1521 12/29/18 0612 12/29/18 0811 12/29/18 1012 12/30/18 0621  PROCALCITON 0.57  --  0.62  --   --  0.59  LATICACIDVEN  --  4.9*  --  2.7* 2.6*  --     Recent Results (from the past 240 hour(s))  Blood culture (routine x 2)     Status: Abnormal   Collection Time: 12/28/18 10:01 AM   Specimen: BLOOD  Result Value Ref Range Status   Specimen Description BLOOD LEFT ANTECUBITAL  Final   Special Requests   Final    BOTTLES DRAWN AEROBIC AND ANAEROBIC Blood Culture adequate volume   Culture  Setup Time   Final    GRAM POSITIVE COCCI ANAEROBIC BOTTLE ONLY CRITICAL RESULT CALLED TO, READ BACK BY AND VERIFIED WITH: PHARMD V BRYK [id number] MLM    Culture (A)  Final    STAPHYLOCOCCUS SPECIES (COAGULASE NEGATIVE) THE SIGNIFICANCE OF ISOLATING THIS ORGANISM  FROM A SINGLE SET OF BLOOD CULTURES WHEN MULTIPLE SETS ARE DRAWN  IS UNCERTAIN. PLEASE NOTIFY THE MICROBIOLOGY DEPARTMENT WITHIN ONE WEEK IF SPECIATION AND SENSITIVITIES ARE REQUIRED. Performed at [facility], [place] [address] [place], [place] [address]    Report Status 12/31/2018 FINAL  Final  Blood Culture ID Panel (Reflexed)     Status: Abnormal   Collection Time: 12/28/18 10:01 AM  Result Value Ref Range Status   Enterococcus species NOT DETECTED NOT DETECTED Final   Listeria monocytogenes NOT DETECTED NOT DETECTED Final   Staphylococcus species DETECTED (A) NOT DETECTED Final    Comment: Methicillin (oxacillin) susceptible coagulase negative staphylococcus. Possible blood culture contaminant (unless isolated from more than one blood culture draw or clinical case suggests pathogenicity). No antibiotic treatment is indicated for blood  culture contaminants. CRITICAL RESULT CALLED TO, READ BACK BY AND VERIFIED WITH: PHARMD V BRYK [id number] MLM    Staphylococcus aureus (BCID) NOT DETECTED NOT DETECTED Final   Methicillin resistance NOT DETECTED NOT DETECTED Final   Streptococcus species NOT DETECTED NOT DETECTED Final   Streptococcus agalactiae NOT DETECTED NOT DETECTED Final   Streptococcus pneumoniae NOT DETECTED NOT DETECTED Final   Streptococcus pyogenes NOT DETECTED NOT DETECTED Final   Acinetobacter baumannii NOT DETECTED NOT DETECTED Final   Enterobacteriaceae species NOT DETECTED NOT DETECTED Final   Enterobacter cloacae complex NOT DETECTED NOT DETECTED Final   Escherichia coli NOT DETECTED NOT DETECTED Final   Klebsiella oxytoca NOT DETECTED NOT DETECTED Final   Klebsiella pneumoniae NOT DETECTED NOT DETECTED Final   Proteus species NOT DETECTED NOT DETECTED Final   Serratia marcescens NOT DETECTED NOT DETECTED Final   Haemophilus influenzae NOT DETECTED NOT DETECTED Final   Neisseria meningitidis NOT DETECTED NOT DETECTED Final   Pseudomonas aeruginosa NOT DETECTED  NOT DETECTED Final   Candida albicans NOT DETECTED NOT DETECTED Final   Candida glabrata NOT DETECTED NOT DETECTED Final   Candida krusei NOT DETECTED NOT DETECTED Final   Candida parapsilosis NOT DETECTED NOT DETECTED Final   Candida tropicalis NOT DETECTED NOT DETECTED Final    Comment: Performed at [facility], [address], [place], [place] [address]  Blood culture (routine x 2)     Status: None   Collection Time: 12/28/18 10:06 AM   Specimen: BLOOD  Result Value Ref Range Status   Specimen Description BLOOD SITE NOT SPECIFIED  Final   Special Requests   Final    BOTTLES DRAWN AEROBIC ONLY Blood Culture results may not be optimal due to an inadequate volume of blood received in culture bottles   Culture   Final    NO GROWTH 5 DAYS Performed at [facility], [place] [address] [place], [place] [address]    Report Status 01/02/2019 FINAL  Final  SARS Coronavirus 2 (CEPHEID- Performed in [facility] lab), Hosp Order     Status: None   Collection Time: 12/28/18 10:14 AM   Specimen: Nasopharyngeal Swab  Result Value Ref Range Status   SARS Coronavirus 2 NEGATIVE NEGATIVE Final    Comment: (NOTE) If result is NEGATIVE SARS-CoV-2 target nucleic acids are NOT DETECTED. The SARS-CoV-2 RNA is generally detectable in upper and lower  respiratory specimens during the acute phase of infection. The lowest  concentration of SARS-CoV-2 viral copies this assay can detect is 250  copies / mL. A negative result does not preclude SARS-CoV-2 infection  and should not be used as the sole basis for treatment or other  patient management decisions.  A negative result may occur with  improper specimen collection / handling, submission of specimen other  than nasopharyngeal swab, presence of viral mutation(s) within the  areas targeted by this assay, and inadequate number of viral copies  (<250 copies / mL). A negative result must be combined with clinical  observations, patient  history, and epidemiological information. If result is POSITIVE SARS-CoV-2 target nucleic acids are DETECTED. The SARS-CoV-2 RNA is generally detectable in upper and lower  respiratory specimens dur ing the acute phase of infection.  Positive  results are indicative of active infection with SARS-CoV-2.  Clinical  correlation with patient history and other diagnostic information is  necessary to determine patient infection status.  Positive results do  not rule out bacterial infection or co-infection with other viruses. If result is PRESUMPTIVE POSTIVE SARS-CoV-2 nucleic acids MAY BE PRESENT.   A presumptive positive result was obtained on the submitted specimen  and confirmed on repeat testing.  While 2019 novel coronavirus  (SARS-CoV-2) nucleic acids may be present in the submitted sample  additional confirmatory testing may be necessary for epidemiological  and / or clinical management purposes  to differentiate between  SARS-CoV-2 and other Sarbecovirus currently known to infect humans.  If clinically indicated additional testing with an alternate test  methodology [phone]) is advised. The SARS-CoV-2 RNA is generally  detectable in upper and lower respiratory sp ecimens during the acute  phase of infection. The expected result is Negative. Fact Sheet for Patients:  StrictlyIdeas.no Fact Sheet for Healthcare Providers: BankingDealers.co.za This test is not yet approved or cleared by the [place] FDA and has been authorized for detection and/or diagnosis of SARS-CoV-2 by FDA under an Emergency Use Authorization (EUA).  This EUA will remain in effect (meaning this test can be used) for the duration of the COVID-19 declaration under Section 564(b)(1) of the Act, 21 U.S.C. section 360bbb-3(b)(1), unless the authorization is terminated or revoked sooner. Performed at [facility], [place] [address] [place], [place] [address]    Urine culture     Status: Abnormal   Collection Time: 12/28/18  2:15 PM   Specimen: In/Out Cath Urine  Result Value Ref Range Status   Specimen Description IN/OUT CATH URINE  Final   Special Requests   Final    NONE Performed at [facility], [place] [address], [place], [place] [address]    Culture 10,000 COLONIES/mL ESCHERICHIA COLI (A)  Final   Report Status 12/30/2018 FINAL  Final   Organism ID, Bacteria ESCHERICHIA COLI (A)  Final      Susceptibility   Escherichia coli - MIC*    AMPICILLIN >=32 RESISTANT Resistant     CEFAZOLIN <=4 SENSITIVE Sensitive     CEFTRIAXONE <=1 SENSITIVE Sensitive     CIPROFLOXACIN <=0.25 SENSITIVE Sensitive     GENTAMICIN <=1 SENSITIVE Sensitive     IMIPENEM <=0.25 SENSITIVE Sensitive     NITROFURANTOIN <=16 SENSITIVE Sensitive     TRIMETH/SULFA <=20 SENSITIVE Sensitive     AMPICILLIN/SULBACTAM 8 SENSITIVE Sensitive     PIP/TAZO <=4 SENSITIVE Sensitive     Extended ESBL NEGATIVE Sensitive     * 10,000 COLONIES/mL ESCHERICHIA COLI  MRSA PCR Screening     Status: None   Collection Time: 12/28/18  5:15 PM   Specimen: Nasopharyngeal  Result Value Ref Range Status   MRSA by PCR NEGATIVE NEGATIVE Final    Comment:        The GeneXpert MRSA Assay (FDA approved for NASAL specimens only), is one component of a  comprehensive MRSA colonization surveillance program. It is not intended to diagnose MRSA infection nor to guide or monitor treatment for MRSA infections. Performed at [facility], [place] [address], [place], [place] [address]   Culture, blood (routine x 2)     Status: None   Collection Time: 12/29/18 10:00 AM   Specimen: BLOOD  Result Value Ref Range Status   Specimen Description BLOOD RIGHT ANTECUBITAL  Final   Special Requests   Final    BOTTLES DRAWN AEROBIC ONLY Blood Culture adequate volume   Culture   Final    NO GROWTH 5 DAYS Performed at [facility], [place] [address], [place], [place] [address]    Report Status  01/03/2019 FINAL  Final  Culture, blood (routine x 2)     Status: None   Collection Time: 12/29/18 10:12 AM   Specimen: BLOOD  Result Value Ref Range Status   Specimen Description BLOOD LEFT ANTECUBITAL  Final   Special Requests   Final    BOTTLES DRAWN AEROBIC ONLY Blood Culture adequate volume   Culture   Final    NO GROWTH 5 DAYS Performed at [facility], [place] [address] [place], [place] [address]    Report Status 01/03/2019 FINAL  Final         Radiology Studies: No results found.      Scheduled Meds: . allopurinol  100 mg Oral Daily  . amiodarone  200 mg Oral Daily  . apixaban  5 mg Oral BID  . busPIRone  5 mg Oral BID  . citalopram  20 mg Oral Daily  . colchicine  0.6 mg Oral Daily  . feeding supplement  1 Container Oral TID BM  . furosemide  40 mg Oral Daily  . Gerhardt's butt cream   Topical TID  . insulin aspart  0-9 Units Subcutaneous TID WC  . levothyroxine  100 mcg Oral Q0600  . metoprolol tartrate  12.5 mg Oral Q6H  . mirtazapine  7.5 mg Oral QHS   Continuous Infusions:    LOS: 7 days    Time spent: 35 minutes.    Elmarie Shiley, MD [facility] Pager [phone]  If 7PM-7AM, please contact night-coverage [email or web address] Password [facility] 01/04/2019, 1:36 PM

## 2019-01-04 NOTE — Progress Notes (Signed)
PT Note   Patient suffers from weakness and hypotension which impairs their ability to perform daily activities like ambulation and transfers in the home.  A walker alone will not resolve the issues with performing activities of daily living. A wheelchair will allow patient to safely perform daily activities.  The patient can self propel in the home or has a caregiver who can provide assistance.    [place] Pager [phone] Office [phone]

## 2019-01-04 NOTE — Progress Notes (Signed)
Patient complaining shortness of breath lung sound diminished oxygen saturations 96-98% on 2.5 liters nasal canula.Patient does have generalized edema arms and legs. Text paged Forrest Moron NP.New orders received for chest xray and lasix.Will continue to monitor.

## 2019-01-04 NOTE — Progress Notes (Signed)
   01/04/19 1200  Clinical Encounter Type  Visited With Patient  Visit Type Initial;Spiritual support  Referral From Nurse  Consult/Referral To Chaplain  This chaplain responded to the NT referral for Pt. Spiritual care.  The chaplain was pastorally present with the Pt. as she tasted her lunch.  The chaplain heard the Pt. choose not to eat because of her difficulty breathing.  The chaplain listened as the Pt. intermittently shared stories of her family and role as a Print production planner.  The Pt. accepted the invitation for prayer with the chaplain.  The chaplain is available for F/U spiritual care as needed.

## 2019-01-04 NOTE — Progress Notes (Signed)
Physical Therapy Treatment Patient Details Name: Teresa [name] MRN: [id number] DOB: 06-06-1941 Today's Date: 01/04/2019    History of Present Illness Pt adm with weakness, hypotension, acute on chronic renal failure. 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 PMH - afib, chf, anxiety, dm, cad,     PT Comments    Pt making slow, steady progress. Able to take a few steps today with assist of 2 people. Feel pt will need a wheelchair at home as well as a hospital bed to decrease burden of care so family better able to care for patient.    Follow Up Recommendations  Home health PT;Supervision/Assistance - 24 hour(if family can't provide assist will need SNF)     Equipment Recommendations  Wheelchair (measurements PT);Wheelchair cushion (measurements PT);Hospital bed    Recommendations for Other Services       Precautions / Restrictions Precautions Precautions: Fall Restrictions Weight Bearing Restrictions: No    Mobility  Bed Mobility Overal bed mobility: Needs Assistance Bed Mobility: Supine to Sit     Supine to sit: Mod assist;+2 for safety/equipment     General bed mobility comments: Assist to bring legs off of bed, elevate trunk into sitting and bring hips to EOB  Transfers Overall transfer level: Needs assistance Equipment used: Rolling walker (2 wheeled) Transfers: Sit to/from Stand Sit to Stand: Mod assist;+2 physical assistance         General transfer comment: Assist to bring hips up and for balance. Verbal cues for hand and foot placement. Verbal and tactile cues to fully extend trunk and hips. Performed x 2  Ambulation/Gait Ambulation/Gait assistance: +2 physical assistance;Mod assist Gait Distance (Feet): 3 Feet Assistive device: Rolling walker (2 wheeled) Gait Pattern/deviations: Step-to pattern;Decreased step length - right;Decreased step length - left;Shuffle;Trunk flexed Gait velocity: decr Gait velocity interpretation: <1.31 ft/sec, indicative of  household ambulator General Gait Details: Assist to maintain trunk/hip extension and for balance. Pt fatigued quickly and chair brought to pt. Pt on 2L of O2. SpO2 99%. HR 152 with amb.    Stairs             Wheelchair Mobility    Modified Rankin (Stroke Patients Only)       Balance Overall balance assessment: Needs assistance Sitting-balance support: No upper extremity supported;Feet supported Sitting balance-Leahy Scale: Fair     Standing balance support: Bilateral upper extremity supported Standing balance-Leahy Scale: Poor Standing balance comment: walker and min assist for static standing                            Cognition Arousal/Alertness: Awake/alert Behavior During Therapy: WFL for tasks assessed/performed Overall Cognitive Status: No family/caregiver present to determine baseline cognitive functioning Area of Impairment: Following commands;Safety/judgement;Problem solving                       Following Commands: Follows multi-step commands with increased time;Follows multi-step commands consistently Safety/Judgement: Decreased awareness of safety;Decreased awareness of deficits   Problem Solving: Slow processing;Requires verbal cues;Requires tactile cues        Exercises      General Comments General comments (skin integrity, edema, etc.): HR to 152 with amb. SpO2 99% on 2L      Pertinent Vitals/Pain Pain Assessment: Faces Faces Pain Scale: Hurts little more Pain Location: R foot especially big toe Pain Descriptors / Indicators: Sore;Grimacing;Guarding Pain Intervention(s): Limited activity within patient's tolerance;Monitored during session;Repositioned    Home  Living                      Prior Function            PT Goals (current goals can now be found in the care plan section) Acute Rehab PT Goals Patient Stated Goal: to walk again Progress towards PT goals: Progressing toward goals    Frequency     Min 3X/week      PT Plan Current plan remains appropriate    Co-evaluation              AM-PAC PT "6 Clicks" Mobility   Outcome Measure  Help needed turning from your back to your side while in a flat bed without using bedrails?: Total Help needed moving from lying on your back to sitting on the side of a flat bed without using bedrails?: A Lot Help needed moving to and from a bed to a chair (including a wheelchair)?: A Lot Help needed standing up from a chair using your arms (e.g., wheelchair or bedside chair)?: A Lot Help needed to walk in hospital room?: A Lot Help needed climbing 3-5 steps with a railing? : Total 6 Click Score: 10    End of Session Equipment Utilized During Treatment: Gait belt;Oxygen Activity Tolerance: Patient limited by pain;Patient limited by fatigue Patient left: with call bell/phone within reach;in chair;with chair alarm set Nurse Communication: Mobility status;Need for lift equipment PT Visit Diagnosis: Other abnormalities of gait and mobility (R26.89);Pain Pain - Right/Left: Right Pain - part of body: Ankle and joints of foot     Time: 1100-1126 PT Time Calculation (min) (ACUTE ONLY): 26 min  Charges:  $Therapeutic Activity: 23-37 mins                     [place] Pager [phone] Office [place] 01/04/2019, 1:52 PM

## 2019-01-04 NOTE — Progress Notes (Signed)
MD notified about increasing extremity edema x4, increased work of breathing despite sats 99% on 2L and RR 18. Lungs clear/diminished on auscultation. 40mg  IV Lasix given. Will continue to monitor.   Ellwood Handler, RN 01/04/19 6:09 PM

## 2019-01-05 DIAGNOSIS — J810 Acute pulmonary edema: Secondary | ICD-10-CM

## 2019-01-05 LAB — GLUCOSE, CAPILLARY
Glucose-Capillary: 110 mg/dL — ABNORMAL HIGH (ref 70–99)
Glucose-Capillary: 161 mg/dL — ABNORMAL HIGH (ref 70–99)
Glucose-Capillary: 162 mg/dL — ABNORMAL HIGH (ref 70–99)
Glucose-Capillary: 134 mg/dL — ABNORMAL HIGH (ref 70–99)

## 2019-01-05 MED ORDER — TORSEMIDE 20 MG PO TABS
40.0000 mg | ORAL_TABLET | Freq: Every day | ORAL | Status: DC
  Administered 2019-01-06 – 2019-01-13 (×8): 40 mg via ORAL
  Filled 2019-01-05 (×8): qty 2

## 2019-01-05 MED ORDER — LORAZEPAM 0.5 MG PO TABS
0.5000 mg | ORAL_TABLET | Freq: Once | ORAL | Status: AC
  Administered 2019-01-05: 0.5 mg via ORAL
  Filled 2019-01-05: qty 1

## 2019-01-05 MED ORDER — MORPHINE SULFATE (PF) 2 MG/ML IV SOLN
0.5000 mg | Freq: Once | INTRAVENOUS | Status: AC
  Administered 2019-01-05: 0.5 mg via INTRAVENOUS
  Filled 2019-01-05: qty 1

## 2019-01-05 NOTE — Progress Notes (Signed)
Progress Note  Patient Name: Teresa [name] Date of Encounter: 01/05/2019  Primary Cardiologist: Minus Breeding, MD   Subjective   Sitting comfortably in chair. Hard of hearing. Denies any new concerns. No chest pain, shortness of breath.  Inpatient Medications    Scheduled Meds: . allopurinol  100 mg Oral Daily  . amiodarone  200 mg Oral Daily  . apixaban  5 mg Oral BID  . busPIRone  5 mg Oral BID  . citalopram  20 mg Oral Daily  . colchicine  0.6 mg Oral Daily  . feeding supplement  1 Container Oral TID BM  . Gerhardt's butt cream   Topical TID  . insulin aspart  0-9 Units Subcutaneous TID WC  . levothyroxine  100 mcg Oral Q0600  . magnesium oxide  200 mg Oral BID  . metoprolol tartrate  12.5 mg Oral Q6H  . mirtazapine  7.5 mg Oral QHS  . [START ON 01/06/2019] torsemide  40 mg Oral Daily   Continuous Infusions:  PRN Meds: HYDROcodone-acetaminophen, lip balm, metoprolol tartrate, polyethylene glycol, sodium chloride flush   Vital Signs    Vitals:   01/04/19 2350 01/05/19 0519 01/05/19 0523 01/05/19 0755  BP: 127/69  (!) 102/48 (!) 112/93  Pulse: 83  86 (!) 101  Resp:    (!) 34  Temp: 98 F (36.7 C)   98.3 F (36.8 C)  TempSrc: Oral   Oral  SpO2: 99%   99%  Weight:  100.3 kg    Height:        Intake/Output Summary (Last 24 hours) at 01/05/2019 1132 Last data filed at 01/05/2019 0648 Gross per 24 hour  Intake 920 ml  Output 1200 ml  Net -280 ml   Last 3 Weights 01/05/2019 01/04/2019 01/03/2019  Weight (lbs) 221 lb 1.9 oz 229 lb 11.5 oz 229 lb 11.5 oz  Weight (kg) 100.3 kg 104.2 kg 104.2 kg      Telemetry     afib, rates more 80s-100s today, occ PVCs- Personally Reviewed  ECG    No new since 7/15 - Personally Reviewed  Physical Exam   GEN: Well nourished, well developed in no acute distress. Very hard of hearing HEENT: Normal NECK: JVD just above clavicle sitting upright CARDIAC: irregularly irregular rhythm, normal S1 and S2, no murmurs, rubs,  gallops. Radial 2+ bilaterally. RESPIRATORY:  Clear to auscultation without rales, wheezing or rhonchi  ABDOMEN: Soft, non-tender, non-distended MUSCULOSKELETAL:  Bilateral pitting and nonpitting edema, appears softer today SKIN: Warm and dry NEUROLOGIC:  Alert, had of hearing PSYCHIATRIC:  Normal affect   Labs    High Sensitivity Troponin:   Recent Labs  Lab 12/28/18 1000 12/28/18 1220  TROPONINIHS 49* 31*      Cardiac EnzymesNo results for input(s): TROPONINI in the last 168 hours. No results for input(s): TROPIPOC in the last 168 hours.   Chemistry Recent Labs  Lab 12/30/18 1011  12/31/18 1127  01/02/19 1026 01/03/19 0705 01/04/19 0735  NA 139   < >  --    < > 139 138 135  K 4.1   < >  --    < > 3.4* 3.8 4.5  CL 104   < >  --    < > 98 99 98  CO2 19*   < >  --    < > 30 30 27   GLUCOSE 116*   < >  --    < > 197* 99 138*  BUN 62*   < >  --    < >  34* 34* 36*  CREATININE 4.02*   < >  --    < > 1.81* 1.60* 1.65*  CALCIUM 8.2*   < >  --    < > 8.5* 7.8* 8.6*  PROT 4.7*  --  4.9*  --   --  4.4*  --   ALBUMIN 2.6*  --  2.6*  --   --  2.2*  --   AST 357*  --  268*  --   --  58*  --   ALT 317*  --  294*  --   --  48*  --   ALKPHOS 151*  --  142*  --   --  149*  --   BILITOT 2.1*  --  1.9*  --   --  1.7*  --   GFRNONAA 10*   < >  --    < > 26* 31* 29*  GFRAA 12*   < >  --    < > 30* 35* 34*  ANIONGAP 16*   < >  --    < > 11 9 10    < > = values in this interval not displayed.     Hematology Recent Labs  Lab 01/02/19 0704 01/03/19 0705 01/04/19 0736  WBC 7.1 7.3 11.6*  RBC 4.17 3.88 4.73  HGB 13.0 11.9* 14.4  HCT 41.3 36.5 45.3  MCV 99.0 94.1 95.8  MCH 31.2 30.7 30.4  MCHC 31.5 32.6 31.8  RDW 16.1* 15.7* 16.0*  PLT 110* 92* 152    BNPNo results for input(s): BNP, PROBNP in the last 168 hours.   DDimer No results for input(s): DDIMER in the last 168 hours.   Radiology    Dg Chest Port 1 View  Result Date: 01/04/2019 CLINICAL DATA:  Increased shortness of  breath tonight. History of CHF. EXAM: PORTABLE CHEST 1 VIEW COMPARISON:  Radiograph 01/01/2019 FINDINGS: Cardiomegaly, unchanged. Aortic atherosclerosis. Progressive peribronchial thickening suspicious for pulmonary edema. Hazy bibasilar opacities consistent with pleural effusions. No pneumothorax. IMPRESSION: Development of pulmonary edema and bilateral pleural effusions consistent with CHF. Cardiomegaly is unchanged. Electronically Signed   By: Keith Rake M.D.   On: 01/04/2019 21:53    Cardiac Studies   Echo 12/29/18  1. The left ventricle has severely reduced systolic function, with an ejection fraction of 20-25%. The cavity size was normal. Left ventricular diastolic function could not be evaluated secondary to atrial fibrillation.  2. The right ventricle has low normal systolic function. The cavity was moderately enlarged. There is no increase in right ventricular wall thickness.  3. There is interventricular flattening during diastolic indicative of RV volume overload.  4. Left atrial size was mild-moderately dilated.  5. The LA appears larger than the biplane measurements suggest.  6. Right atrial size was moderately dilated.  7. Trivial pericardial effusion is present.  8. Mitral valve regurgitation is moderate by color flow Doppler. No evidence of mitral valve stenosis.  9. Tricuspid valve regurgitation is moderate-severe. 10. Aortic valve regurgitation is trivial by color flow Doppler. No stenosis of the aortic valve. 11. The aortic root and ascending aorta are normal in size and structure. 12. The inferior vena cava was normal in size with <50% respiratory variability. 13. The interatrial septum was not assessed.  Patient Profile     78 y.o. female with PMH chronic systolic heart failure (EF was 35-40% on TTE, 40-45% on TEE), moderate-severe MR, mild-moderate TR, mild AI, persistent atrial fibrillation, type II diabetes, chronic kidney disease, hypothyroidism  who is followed at  the request of Dr. Lorane Gell. Cathlean Sauer for acute on chronic heart failure and hypotension (hypotension now resolved).  Assessment & Plan    Acute on chronic systolic heart failure: updated echo shows EF drop to 20-25%, biatrial enlargement, moderate MR, moderate-severe TR. -would aim for continued net negative 526ml-1L daily for now. If she does not have significant urine output with oral lasix, would give afternoon IV dose. Will change to 40 mg torsemide oral for tomorrow in case gut edema is affecting response to oral lasix. Will continue to monitor. Will need good UOP charting to monitor this. -wt today 100.3 kg (peak 104.2) from 94.3 kg on admission. Her I/O are inaccurate though better charted yesterday.   -rates improving with change from carvedilol to metoprolol.  -no ACEI/ARB/ARNI/MRA currently as she presented with AKI, but as her Cr improves and BP remains stable, may be able to add low dose. Need to be cautious as she presented with hypotension. -Cr 1.65 today (1.60 yesterday), downtrending from 4.82 on admission. K 4.5. -troponin trend consistent with demand ischemia from hypotension/HF. Not consistent with ACS -she does have bilateral LE edema. Issue will be that with her EF and her TR, she will be very prone to volume overload.  -continue fluid restriction at 1.5L daily. Would absolutely keep below 2L, discussed this with her. -her albumin is also 2.2, which makes her prone to edema.  Persistent atrial fibrillation: -CHA2DS2/VAS Stroke Risk Points= 6 -on apixaban for anticoagulation -on amiodarone. Could consider cardioversion if rates remain difficult to control. Unclear what the likelihood of success would be for this. -digoxin added for tachycardia, not continued. Can use PRN -better rate control with metoprolol as opposed to prior carvedilol  Acute on chronic renal insufficiency: Improved since admission -management per primary team -right kidney lesion needs 6 mos follow up  with PCP post discharge.   For questions or updates, please contact [place] Please consult [email or web address] for contact info under     Signed, Buford Dresser, MD  01/05/2019, 11:32 AM

## 2019-01-05 NOTE — Progress Notes (Signed)
Nutrition Follow-up  DOCUMENTATION CODES:   Obesity unspecified  INTERVENTION:   -Continue Boost Breeze po TID, each supplement provides 250 kcal and 9 grams of protein  NUTRITION DIAGNOSIS:   Inadequate oral intake related to (altered mental status) as evidenced by per patient/family report.  Ongoing.  GOAL:   Patient will meet greater than or equal to 90% of their needs  Progressing.  MONITOR:   PO intake, Supplement acceptance, Labs, Weight trends, I & O's  ASSESSMENT:   78 year old with past medical history significant for hypertension, diabetes, coronary artery disease, heart failure reduced ejection fraction, A. fib, depression who was brought in because worsening weakness.  **RD working remotely**  Patient has been consuming 25-30% of meals in the last few days. Pt ate 1/2 banana, grits and applesauce this morning per documentation. Pt reports she is not eating well d/t breathing. Pt is drinking [employer], will continue supplement.   Per weight records, pt's weight has increased +13 lbs since 7/14. Per I/Os: +8.2L since admit, -830 ml x 24 hours.  Medications: Lasix tablet daily, MAG-OX tablet BID, Remeron tablet daily Labs reviewed: CBGs: 110-181  Diet Order:   Diet Order            DIET DYS 3 Room service appropriate? Yes; Fluid consistency: Thin; Fluid restriction: 1500 mL Fluid  Diet effective now              EDUCATION NEEDS:   Not appropriate for education at this time  Skin:  Skin Assessment: Reviewed RN Assessment  Last BM:  7/22  Height:   Ht Readings from Last 1 Encounters:  12/28/18 5\' 3"  (1.6 m)    Weight:   Wt Readings from Last 1 Encounters:  01/05/19 100.3 kg    Ideal Body Weight:  52.3 kg  BMI:  Body mass index is 39.17 kg/m.  Estimated Nutritional Needs:   Kcal:  1550-1750  Protein:  65-75g  Fluid:  1.5L/day  Clayton Bibles, MS, RD, LDN [place] Dietitian Pager: [phone] After Hours  Pager: [phone]

## 2019-01-05 NOTE — Progress Notes (Signed)
Pt sitting up in chair. Pt encouraged to sit upright for additional 10-15 minutes d/t patient looking better (skin more with normal skin tone color, breathing improvement and able to be more independent with drinking and reaching items) than compared to when pt in bed.

## 2019-01-05 NOTE — Progress Notes (Signed)
Physical Therapy Treatment Patient Details Name: Teresa [name] MRN: [id number] DOB: 1940-12-17 Today's Date: 01/05/2019    History of Present Illness Pt adm with weakness, hypotension, acute on chronic renal failure. 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 PMH - afib, chf, anxiety, dm, cad,     PT Comments    Continuing work on functional mobility and activity tolerance;  Slow progress, requiring 2 person assist for transfers sit<> stand and basic pivot OOB to chair; At this point, it is worth considering SNF for post-acute rehab to maximize independence and safety with mobility, and decr caregiver burden prior to dc home; Noted that this is a change in plan, and that the plan and expectation is that she will go home; I'm concerned that if she doesn't make considerable functional improvement, dc home will be unmanageable  Follow Up Recommendations  SNF;Supervision/Assistance - 24 hour;Other (comment)(Consider home if family can provide 24 hour reliable mod/max assist)     Equipment Recommendations  Wheelchair (measurements PT);Wheelchair cushion (measurements PT);Hospital bed(potential PTAR home)    Recommendations for Other Services       Precautions / Restrictions Precautions Precautions: Fall    Mobility  Bed Mobility Overal bed mobility: Needs Assistance Bed Mobility: Supine to Sit Rolling: +2 for physical assistance;Mod assist         General bed mobility comments: Assist to bring legs off of bed, elevate trunk into sitting and bring hips to EOB; Lots of cues to initiate, and then step-by-step cues  Transfers Overall transfer level: Needs assistance Equipment used: Rolling walker (2 wheeled) Transfers: Sit to/from [employer] Sit to Stand: Mod assist;+2 physical assistance Stand pivot transfers: Mod assist;+2 physical assistance       General transfer comment: Assist to bring hips up and for balance. Verbal cues for hand and foot placement.  Verbal and tactile cues to fully extend trunk and hips. Performed x 2; Initial attempt, pt sat back down to bed without much warning -- it seems she did not fully understand what we were asking her to do; more successful second attempt, when we moved the recliner closer, and we were clear about getting up to the recliner; also we pivoted to her less painful L side   Ambulation/Gait             General Gait Details: 2 small pivotal steps bed to chair   Stairs             Wheelchair Mobility    Modified Rankin (Stroke Patients Only)       Balance             Standing balance-Leahy Scale: Poor Standing balance comment: walker and min assist for static standing                            Cognition Arousal/Alertness: Awake/alert Behavior During Therapy: WFL for tasks assessed/performed Overall Cognitive Status: No family/caregiver present to determine baseline cognitive functioning                               Problem Solving: Slow processing;Requires verbal cues;Requires tactile cues        Exercises      General Comments General comments (skin integrity, edema, etc.): Session conducted on 2 L supplemental O2      Pertinent Vitals/Pain Pain Assessment: Faces Faces Pain Scale: Hurts little more Pain Location:  R foot especially big toe Pain Descriptors / Indicators: Sore;Grimacing;Guarding Pain Intervention(s): Limited activity within patient's tolerance    Home Living                      Prior Function            PT Goals (current goals can now be found in the care plan section) Acute Rehab PT Goals Patient Stated Goal: to walk again PT Goal Formulation: Patient unable to participate in goal setting Time For Goal Achievement: 01/13/19 Potential to Achieve Goals: Fair Progress towards PT goals: Progressing toward goals(very slowly)    Frequency    Min 3X/week      PT Plan Discharge plan needs to be  updated    Co-evaluation              AM-PAC PT "6 Clicks" Mobility   Outcome Measure  Help needed turning from your back to your side while in a flat bed without using bedrails?: A Lot Help needed moving from lying on your back to sitting on the side of a flat bed without using bedrails?: A Lot Help needed moving to and from a bed to a chair (including a wheelchair)?: A Lot Help needed standing up from a chair using your arms (e.g., wheelchair or bedside chair)?: A Lot Help needed to walk in hospital room?: Total Help needed climbing 3-5 steps with a railing? : Total 6 Click Score: 10    End of Session Equipment Utilized During Treatment: Gait belt;Oxygen Activity Tolerance: Patient limited by pain;Patient limited by fatigue Patient left: with call bell/phone within reach;in chair;with chair alarm set Nurse Communication: Mobility status;Need for lift equipment PT Visit Diagnosis: Other abnormalities of gait and mobility (R26.89);Pain Pain - Right/Left: Right Pain - part of body: Ankle and joints of foot     Time: 0940-1001 PT Time Calculation (min) (ACUTE ONLY): 21 min  Charges:  $Therapeutic Activity: 8-22 mins                     Roney Marion, PT  Acute Rehabilitation Services Pager [phone] Office [place] 01/05/2019, 10:44 AM

## 2019-01-05 NOTE — Progress Notes (Signed)
Spoke with son in detail on the phone.  He is undecided if patient should go to SNF (is worried that patient will get more depressed and deteriorate further and with him unable to visit her will make matters worse.  He feels that he will abandon her to die in the nursing home).  He wants to bring her home but works all day and his girlfriend also has to work from home and both will have difficulty to provide 24-hour supervision at home.  Getting caregiver at home is very costly for them as well.  He says he will be able to get an answer in the next 24-48 hours.  Also discuss goals of care.  Son reports that patient wished to get full scope of treatment if she could be "brought back to life" but did not want to prolong life support or living no coma or vegetative state.  Son agrees on patient being followed by palliative care as outpatient.

## 2019-01-05 NOTE — Progress Notes (Signed)
PROGRESS NOTE                                                                                                                                                                                                             Patient Demographics:    Teresa [name], is a 78 y.o. female, DOB - 08-24-1940, VFI:[id number]  Admit date - 12/28/2018   Admitting Physician Vashti Hey, MD  Outpatient Primary MD for the patient is [name], MD  LOS - 8  Outpatient Specialists: cardiology  No chief complaint on file.      Brief Narrative 78 year old female with history of hypertension, diabetes mellitus, coronary artery disease with chronic systolic CHF (EF of 41-66%), A. fib on anticoagulation and depression presented to the ED with increasing weakness.  Patient has started eating and drinking poorly since 10 days prior to admission after her daughter died from CHF.  Patient presented with hyperglycemia, hypertension, acute on chronic kidney injury and lactic acidosis. On the day following admission she was found to be increasingly lethargic with worsened acidosis (bicarb of 11 and pH of 7.2).  PCCM was consulted who recommended medical management.  She was started on bicarb drip.  Her hypertension and acute kidney injury was secondary to hypovolemia and dehydration with poor p.o. intake. Hospital course now prolonged with A. fib with RVR and pulmonary edema   Subjective:   Overnight patient became short of breath.  Chest x-ray showing acute pulmonary edema.  Heart rate continues to be on low 100s on the monitor   Assessment  & Plan :   Principal problem Acute on chronic systolic CHF ([place]) Received a dose of IV Lasix overnight, plan another IV Lasix dose this afternoon if she has good diuresis and switch to torsemide tomorrow.  Strict I's/O and daily weight.  Cardiology following.  Continue beta-blocker.  Not on ACE  inhibitors/ARB due to her AKI.  If renal function and blood pressure continues to remain stable possibly add low-dose.  Active problems Persistent A. fib with RVR CHA2DS2/VASC of 6.  On Eliquis.  Currently on amiodarone.  Also require digoxin for tachycardia (now discontinued).  Rate controlled with metoprolol 12.5 mg every 6 hours.  Cardiology consider cardioversion if rate difficult to control.   Acute on chronic kidney disease stage III (HCC) Baseline creatinine of 1.8-2.  Suspect secondary to hypovolemia and hypotension.  Received IV fluids.  Creatinine was 4.8 on admission, now improved to 1.6.  Now developing fluid overload and getting diuretic.  Monitor closely.  Acute metabolic encephalopathy/hepatic encephalopathy Noted for elevated ammonia of 90 and received lactulose.  Also contributed by hypotension and dehydration.  Now resolved.  Acute metabolic acidosis Possibly secondary to ATN.  Resolved with bicarb drip.  Acute respiratory failure with hypoxia Secondary to fluid overload Now improving with diuresis.  Hypoglycemia Secondary to poor p.o. intake.  Now resolved  Hypothyroidism Continue Synthroid.  Levels normal.  Acute gouty arthritis (right big toe). On 3-day course of prednisone.  Hyperkalemia Received bicarbonate [place].  Now resolved  Transaminitis Possibly shock liver.  Now resolving.  Coag negative blood culture Suspect contaminant.  Did receive 5 days of antibiotic.      Code Status : Full code   Family Communication  : We will update son  Disposition Plan  :  once CHF and heart rate better controlled.  Possibly needs SNF  Barriers For Discharge : Active symptoms  Consults  : Cardiology, palliative care  Procedures  : 2D echo  DVT Prophylaxis  : Eliquis  Lab Results  Component Value Date   PLT 152 01/04/2019    Antibiotics  :    Anti-infectives (From admission, onward)   Start     Dose/Rate Route Frequency Ordered Stop   12/30/18  2200  ceFAZolin (ANCEF) IVPB 1 g/50 mL premix  Status:  Discontinued     1 g 100 mL/hr over 30 Minutes Intravenous Every 12 hours 12/30/18 1318 01/02/19 1301   12/29/18 1400  piperacillin-tazobactam (ZOSYN) IVPB 2.25 g  Status:  Discontinued     2.25 g 100 mL/hr over 30 Minutes Intravenous Every 8 hours 12/29/18 1316 12/30/18 1309   12/29/18 1309  vancomycin variable dose per unstable renal function (pharmacist dosing)  Status:  Discontinued      Does not apply See admin instructions 12/29/18 1309 12/30/18 1308   12/29/18 1200  ceFEPIme (MAXIPIME) 1 g in sodium chloride 0.9 % 100 mL IVPB  Status:  Discontinued     1 g 200 mL/hr over 30 Minutes Intravenous Every 24 hours 12/28/18 1242 12/29/18 1233   12/28/18 1245  ceFEPIme (MAXIPIME) 2 g in sodium chloride 0.9 % 100 mL IVPB     2 g 200 mL/hr over 30 Minutes Intravenous  Once 12/28/18 1233 12/28/18 1333   12/28/18 1245  metroNIDAZOLE (FLAGYL) IVPB 500 mg     500 mg 100 mL/hr over 60 Minutes Intravenous  Once 12/28/18 1233 12/28/18 1403   12/28/18 1245  vancomycin (VANCOCIN) IVPB 1000 mg/200 mL premix     1,000 mg 200 mL/hr over 60 Minutes Intravenous  Once 12/28/18 1233 12/28/18 1402        Objective:   Vitals:   01/04/19 2350 01/05/19 0519 01/05/19 0523 01/05/19 0755  BP: 127/69  (!) 102/48 (!) 112/93  Pulse: 83  86 (!) 101  Resp:    (!) 34  Temp: 98 F (36.7 C)   98.3 F (36.8 C)  TempSrc: Oral   Oral  SpO2: 99%   99%  Weight:  100.3 kg    Height:        Wt Readings from Last 3 Encounters:  01/05/19 100.3 kg  12/01/18 88.5 kg  11/01/18 88.5 kg     Intake/Output Summary (Last 24 hours) at 01/05/2019 1159 Last data filed at 01/05/2019 0648 Gross per 24 hour  Intake 920 ml  Output 1200 ml  Net -280 ml     Physical Exam  Gen: Elderly female, fatigued, not in distress HEENT: Moist mucosa, supple neck Chest: Fine bibasilar crackles CVS: S1 and S2 regular, tachycardic, no murmurs GI: Soft, nondistended  nontender Musculoskeletal: Warm, 1+ pitting edema bilaterally CNS: Alert and oriented    Data Review:    CBC Recent Labs  Lab 12/31/18 0507 01/01/19 0416 01/02/19 0704 01/03/19 0705 01/04/19 0736  WBC 6.3 8.1 7.1 7.3 11.6*  HGB 14.2 13.6 13.0 11.9* 14.4  HCT 43.9 41.6 41.3 36.5 45.3  PLT PLATELET CLUMPS NOTED ON SMEAR, COUNT APPEARS DECREASED 97* 110* 92* 152  MCV 95.2 93.1 99.0 94.1 95.8  MCH 30.8 30.4 31.2 30.7 30.4  MCHC 32.3 32.7 31.5 32.6 31.8  RDW 15.8* 15.5 16.1* 15.7* 16.0*    Chemistries  Recent Labs  Lab 12/30/18 1011 12/31/18 0507 12/31/18 1127 01/01/19 0416 01/02/19 1026 01/03/19 0705 01/04/19 0735  NA 139 140  --  140 139 138 135  K 4.1 3.7  --  3.1* 3.4* 3.8 4.5  CL 104 103  --  101 98 99 98  CO2 19* 23  --  27 30 30 27   GLUCOSE 116* 101*  --  105* 197* 99 138*  BUN 62* 56*  --  45* 34* 34* 36*  CREATININE 4.02* 3.11*  --  2.33* 1.81* 1.60* 1.65*  CALCIUM 8.2* 7.9*  --  7.9* 8.5* 7.8* 8.6*  MG 1.9  --   --   --   --  1.4* 1.7  AST 357*  --  268*  --   --  58*  --   ALT 317*  --  294*  --   --  48*  --   ALKPHOS 151*  --  142*  --   --  149*  --   BILITOT 2.1*  --  1.9*  --   --  1.7*  --    ------------------------------------------------------------------------------------------------------------------ No results for input(s): CHOL, HDL, LDLCALC, TRIG, CHOLHDL, LDLDIRECT in the last 72 hours.  Lab Results  Component Value Date   HGBA1C 5.4 06/07/2018   ------------------------------------------------------------------------------------------------------------------ No results for input(s): TSH, T4TOTAL, T3FREE, THYROIDAB in the last 72 hours.  Invalid input(s): FREET3 ------------------------------------------------------------------------------------------------------------------ No results for input(s): VITAMINB12, FOLATE, FERRITIN, TIBC, IRON, RETICCTPCT in the last 72 hours.  Coagulation profile No results for input(s): INR, PROTIME  in the last 168 hours.  No results for input(s): DDIMER in the last 72 hours.  Cardiac Enzymes No results for input(s): CKMB, TROPONINI, MYOGLOBIN in the last 168 hours.  Invalid input(s): CK ------------------------------------------------------------------------------------------------------------------    Component Value Date/Time   BNP 1,262.8 (H) 10/13/2018 0724   BNP 73.4 03/26/2016 1525    Inpatient Medications  Scheduled Meds:  allopurinol  100 mg Oral Daily   amiodarone  200 mg Oral Daily   apixaban  5 mg Oral BID   busPIRone  5 mg Oral BID   citalopram  20 mg Oral Daily   colchicine  0.6 mg Oral Daily   feeding supplement  1 Container Oral TID BM   Gerhardt's butt cream   Topical TID   insulin aspart  0-9 Units Subcutaneous TID WC   levothyroxine  100 mcg Oral Q0600   magnesium oxide  200 mg Oral BID   metoprolol tartrate  12.5 mg Oral Q6H   mirtazapine  7.5 mg Oral QHS   [START ON 01/06/2019] torsemide  40 mg Oral Daily  Continuous Infusions: PRN Meds:.HYDROcodone-acetaminophen, lip balm, metoprolol tartrate, polyethylene glycol, sodium chloride flush  Micro Results Recent Results (from the past 240 hour(s))  Blood culture (routine x 2)     Status: Abnormal   Collection Time: 12/28/18 10:01 AM   Specimen: BLOOD  Result Value Ref Range Status   Specimen Description BLOOD LEFT ANTECUBITAL  Final   Special Requests   Final    BOTTLES DRAWN AEROBIC AND ANAEROBIC Blood Culture adequate volume   Culture  Setup Time   Final    GRAM POSITIVE COCCI ANAEROBIC BOTTLE ONLY CRITICAL RESULT CALLED TO, READ BACK BY AND VERIFIED WITH: PHARMD V BRYK [id number] MLM    Culture (A)  Final    STAPHYLOCOCCUS SPECIES (COAGULASE NEGATIVE) THE SIGNIFICANCE OF ISOLATING THIS ORGANISM FROM A SINGLE SET OF BLOOD CULTURES WHEN MULTIPLE SETS ARE DRAWN IS UNCERTAIN. PLEASE NOTIFY THE MICROBIOLOGY DEPARTMENT WITHIN ONE WEEK IF SPECIATION AND SENSITIVITIES ARE  REQUIRED. Performed at [facility], [place] [address] [place], [place] [address]    Report Status 12/31/2018 FINAL  Final  Blood Culture ID Panel (Reflexed)     Status: Abnormal   Collection Time: 12/28/18 10:01 AM  Result Value Ref Range Status   Enterococcus species NOT DETECTED NOT DETECTED Final   Listeria monocytogenes NOT DETECTED NOT DETECTED Final   Staphylococcus species DETECTED (A) NOT DETECTED Final    Comment: Methicillin (oxacillin) susceptible coagulase negative staphylococcus. Possible blood culture contaminant (unless isolated from more than one blood culture draw or clinical case suggests pathogenicity). No antibiotic treatment is indicated for blood  culture contaminants. CRITICAL RESULT CALLED TO, READ BACK BY AND VERIFIED WITH: PHARMD V BRYK [id number] MLM    Staphylococcus aureus (BCID) NOT DETECTED NOT DETECTED Final   Methicillin resistance NOT DETECTED NOT DETECTED Final   Streptococcus species NOT DETECTED NOT DETECTED Final   Streptococcus agalactiae NOT DETECTED NOT DETECTED Final   Streptococcus pneumoniae NOT DETECTED NOT DETECTED Final   Streptococcus pyogenes NOT DETECTED NOT DETECTED Final   Acinetobacter baumannii NOT DETECTED NOT DETECTED Final   Enterobacteriaceae species NOT DETECTED NOT DETECTED Final   Enterobacter cloacae complex NOT DETECTED NOT DETECTED Final   Escherichia coli NOT DETECTED NOT DETECTED Final   Klebsiella oxytoca NOT DETECTED NOT DETECTED Final   Klebsiella pneumoniae NOT DETECTED NOT DETECTED Final   Proteus species NOT DETECTED NOT DETECTED Final   Serratia marcescens NOT DETECTED NOT DETECTED Final   Haemophilus influenzae NOT DETECTED NOT DETECTED Final   Neisseria meningitidis NOT DETECTED NOT DETECTED Final   Pseudomonas aeruginosa NOT DETECTED NOT DETECTED Final   Candida albicans NOT DETECTED NOT DETECTED Final   Candida glabrata NOT DETECTED NOT DETECTED Final   Candida krusei NOT DETECTED NOT DETECTED Final    Candida parapsilosis NOT DETECTED NOT DETECTED Final   Candida tropicalis NOT DETECTED NOT DETECTED Final    Comment: Performed at [facility], [address], [place], [place] [address]  Blood culture (routine x 2)     Status: None   Collection Time: 12/28/18 10:06 AM   Specimen: BLOOD  Result Value Ref Range Status   Specimen Description BLOOD SITE NOT SPECIFIED  Final   Special Requests   Final    BOTTLES DRAWN AEROBIC ONLY Blood Culture results may not be optimal due to an inadequate volume of blood received in culture bottles   Culture   Final    NO GROWTH 5 DAYS Performed at [facility],  [address], [place], [place] [address]    Report Status 01/02/2019 FINAL  Final  SARS Coronavirus 2 (CEPHEID- Performed in [place] hospital lab), Hosp Order     Status: None   Collection Time: 12/28/18 10:14 AM   Specimen: Nasopharyngeal Swab  Result Value Ref Range Status   SARS Coronavirus 2 NEGATIVE NEGATIVE Final    Comment: (NOTE) If result is NEGATIVE SARS-CoV-2 target nucleic acids are NOT DETECTED. The SARS-CoV-2 RNA is generally detectable in upper and lower  respiratory specimens during the acute phase of infection. The lowest  concentration of SARS-CoV-2 viral copies this assay can detect is 250  copies / mL. A negative result does not preclude SARS-CoV-2 infection  and should not be used as the sole basis for treatment or other  patient management decisions.  A negative result may occur with  improper specimen collection / handling, submission of specimen other  than nasopharyngeal swab, presence of viral mutation(s) within the  areas targeted by this assay, and inadequate number of viral copies  (<250 copies / mL). A negative result must be combined with clinical  observations, patient history, and epidemiological information. If result is POSITIVE SARS-CoV-2 target nucleic acids are DETECTED. The SARS-CoV-2 RNA is generally detectable in upper and lower   respiratory specimens dur ing the acute phase of infection.  Positive  results are indicative of active infection with SARS-CoV-2.  Clinical  correlation with patient history and other diagnostic information is  necessary to determine patient infection status.  Positive results do  not rule out bacterial infection or co-infection with other viruses. If result is PRESUMPTIVE POSTIVE SARS-CoV-2 nucleic acids MAY BE PRESENT.   A presumptive positive result was obtained on the submitted specimen  and confirmed on repeat testing.  While 2019 novel coronavirus  (SARS-CoV-2) nucleic acids may be present in the submitted sample  additional confirmatory testing may be necessary for epidemiological  and / or clinical management purposes  to differentiate between  SARS-CoV-2 and other Sarbecovirus currently known to infect humans.  If clinically indicated additional testing with an alternate test  methodology [phone]) is advised. The SARS-CoV-2 RNA is generally  detectable in upper and lower respiratory sp ecimens during the acute  phase of infection. The expected result is Negative. Fact Sheet for Patients:  StrictlyIdeas.no Fact Sheet for Healthcare Providers: BankingDealers.co.za This test is not yet approved or cleared by the [place] FDA and has been authorized for detection and/or diagnosis of SARS-CoV-2 by FDA under an Emergency Use Authorization (EUA).  This EUA will remain in effect (meaning this test can be used) for the duration of the COVID-19 declaration under Section 564(b)(1) of the Act, 21 U.S.C. section 360bbb-3(b)(1), unless the authorization is terminated or revoked sooner. Performed at [facility], [place] [address] [place], [place] [address]   Urine culture     Status: Abnormal   Collection Time: 12/28/18  2:15 PM   Specimen: In/Out Cath Urine  Result Value Ref Range Status   Specimen Description IN/OUT CATH  URINE  Final   Special Requests   Final    NONE Performed at [facility], [place] [address] [place], [place] [address]    Culture 10,000 COLONIES/mL ESCHERICHIA COLI (A)  Final   Report Status 12/30/2018 FINAL  Final   Organism ID, Bacteria ESCHERICHIA COLI (A)  Final      Susceptibility   Escherichia coli - MIC*    AMPICILLIN >=32 RESISTANT Resistant  CEFAZOLIN <=4 SENSITIVE Sensitive     CEFTRIAXONE <=1 SENSITIVE Sensitive     CIPROFLOXACIN <=0.25 SENSITIVE Sensitive     GENTAMICIN <=1 SENSITIVE Sensitive     IMIPENEM <=0.25 SENSITIVE Sensitive     NITROFURANTOIN <=16 SENSITIVE Sensitive     TRIMETH/SULFA <=20 SENSITIVE Sensitive     AMPICILLIN/SULBACTAM 8 SENSITIVE Sensitive     PIP/TAZO <=4 SENSITIVE Sensitive     Extended ESBL NEGATIVE Sensitive     * 10,000 COLONIES/mL ESCHERICHIA COLI  MRSA PCR Screening     Status: None   Collection Time: 12/28/18  5:15 PM   Specimen: Nasopharyngeal  Result Value Ref Range Status   MRSA by PCR NEGATIVE NEGATIVE Final    Comment:        The GeneXpert MRSA Assay (FDA approved for NASAL specimens only), is one component of a comprehensive MRSA colonization surveillance program. It is not intended to diagnose MRSA infection nor to guide or monitor treatment for MRSA infections. Performed at [facility], [place] [address], [place], [place] [address]   Culture, blood (routine x 2)     Status: None   Collection Time: 12/29/18 10:00 AM   Specimen: BLOOD  Result Value Ref Range Status   Specimen Description BLOOD RIGHT ANTECUBITAL  Final   Special Requests   Final    BOTTLES DRAWN AEROBIC ONLY Blood Culture adequate volume   Culture   Final    NO GROWTH 5 DAYS Performed at [facility], [place] [address] [place], [place] [address]    Report Status 01/03/2019 FINAL  Final  Culture, blood (routine x 2)     Status: None   Collection Time: 12/29/18 10:12 AM   Specimen: BLOOD  Result Value Ref Range Status   Specimen  Description BLOOD LEFT ANTECUBITAL  Final   Special Requests   Final    BOTTLES DRAWN AEROBIC ONLY Blood Culture adequate volume   Culture   Final    NO GROWTH 5 DAYS Performed at [facility], [place] [address] [place], [place] [address]    Report Status 01/03/2019 FINAL  Final    Radiology Reports Ct Abdomen Wo Contrast  Result Date: 12/28/2018 CLINICAL DATA:  Elevated liver function tests. Nausea vomiting. EXAM: CT ABDOMEN W ITHOUT CONTRAST TECHNIQUE: Multidetector CT imaging of the abdomen was performed following the standard protocol without IV contrast. COMPARISON:  August 30, 2015 FINDINGS: Lower chest: Partially visualized area of scarring/atelectasis in the lingula. Atelectasis versus airspace consolidation in the dependent portion of the left lower lobe. Enlarged heart. Minimal pericardial thickening versus tiny effusion. Calcific atherosclerotic disease of the aorta and coronary arteries, mild. Hepatobiliary: Normal appearance of the liver. The gallbladder is not abnormally distended but demonstrates a hyperdense appearance. Pancreas: Fatty replaced pancreas. Spleen: Normal in size without focal abnormality. Adrenals/Urinary Tract: Mild left adrenal thickening. Normal nonenhanced appearance of the kidneys. Stomach/Bowel: Stomach is within normal limits. No evidence of bowel wall thickening, distention, or inflammatory changes. Vascular/Lymphatic: Aortic atherosclerosis. No enlarged abdominal lymph nodes. Other: No abdominal wall hernia or abnormality. Musculoskeletal: Spondylosis the lumbosacral spine. IMPRESSION: 1. Unusual hyperdense appearance of the gallbladder. If the patient has had recent administration of IV contrast, this may represent vicarious excretion of contrast. Otherwise MRCP or ERCP may be considered for further investigation. 2. Enlarged heart. Minimal pericardial thickening versus tiny effusion. 3. Partially visualized area of scarring/atelectasis in the lingula. 4.  Atelectasis versus airspace consolidation in the dependent portion of the left lower  lobe. 5. Calcific atherosclerotic disease of the aorta and coronary arteries. Electronically Signed   By: Fidela Salisbury M.D.   On: 12/28/2018 14:22   US Renal  Result Date: 12/31/2018 CLINICAL DATA:  Acute renal insufficiency. EXAM: RENAL / URINARY TRACT ULTRASOUND COMPLETE COMPARISON:  CT 12/28/2018 FINDINGS: Right Kidney: Renal measurements: 10.9 x 4.1 x 4.0 cm = volume: 92 mL . Echogenicity within normal limits. No mass or hydronephrosis visualized. 4 mm indeterminate hyperechoic focus over the mid pole cortex which is not seen on recent noncontrast CT. Left Kidney: Renal measurements: 8.9 x 4.5 x 5.0 cm = volume: 104 mL. Echogenicity within normal limits. No mass or hydronephrosis visualized. Bladder: Appears normal for degree of bladder distention. IMPRESSION: Normal size kidneys without hydronephrosis. Indeterminate 4 mm hyperechoic focus over the mid pole cortex of the right kidney which is not seen on recent noncontrast CT. Recommend follow-up ultrasound 6 months. Electronically Signed   By: Marin Olp M.D.   On: 12/31/2018 16:37   Dg Chest Port 1 View  Result Date: 01/04/2019 CLINICAL DATA:  Increased shortness of breath tonight. History of CHF. EXAM: PORTABLE CHEST 1 VIEW COMPARISON:  Radiograph 01/01/2019 FINDINGS: Cardiomegaly, unchanged. Aortic atherosclerosis. Progressive peribronchial thickening suspicious for pulmonary edema. Hazy bibasilar opacities consistent with pleural effusions. No pneumothorax. IMPRESSION: Development of pulmonary edema and bilateral pleural effusions consistent with CHF. Cardiomegaly is unchanged. Electronically Signed   By: Keith Rake M.D.   On: 01/04/2019 21:53   Dg Chest Port 1 View  Result Date: 01/01/2019 CLINICAL DATA:  Hypoxemia, shortness of breath with weakness today. EXAM: PORTABLE CHEST 1 VIEW COMPARISON:  Chest x-rays dated 12/28/2018 and 10/15/2018.  FINDINGS: Chronic opacity at the LEFT lung base presumed scarring/atelectasis based on recent CT abdomen of 12/28/2018. No new lung findings. No pneumothorax seen. Stable cardiomegaly. IMPRESSION: Stable chest x-ray. No evidence of acute cardiopulmonary abnormality. No evidence of pneumonia or pulmonary edema. Electronically Signed   By: Franki Cabot M.D.   On: 01/01/2019 08:13   Dg Chest Port 1 View  Result Date: 12/28/2018 CLINICAL DATA:  Generalized weakness for 3 days. EXAM: PORTABLE CHEST 1 VIEW COMPARISON:  10/15/2018 FINDINGS: The heart is enlarged but stable. There is tortuosity and calcification of the thoracic aorta. The lungs are grossly clear. No infiltrates, edema or effusions. The bony thorax is intact. IMPRESSION: Stable cardiac enlargement but no acute pulmonary findings. Electronically Signed   By: Marijo Sanes M.D.   On: 12/28/2018 10:40   US Abdomen Limited Ruq  Result Date: 12/29/2018 CLINICAL DATA:  Abnormal transaminase level. EXAM: ULTRASOUND ABDOMEN LIMITED RIGHT UPPER QUADRANT COMPARISON:  CT scan of December 28, 2018. FINDINGS: Gallbladder: No gallstones or wall thickening visualized. No sonographic Murphy sign noted by sonographer. Common bile duct: Diameter: 4 mm which is within normal limits. Liver: No focal lesion identified. Increased echogenicity of hepatic parenchyma is noted suggesting hepatic steatosis or possibly other diffuse hepatocellular disease. Portal vein is patent on color Doppler imaging with normal direction of blood flow towards the liver. IMPRESSION: Mildly increased echogenicity of hepatic parenchyma is noted suggesting hepatic steatosis or other diffuse hepatocellular disease. No other abnormality seen in the right upper quadrant of the abdomen. Electronically Signed   By: Marijo Conception M.D.   On: 12/29/2018 12:08    Time Spent in minutes 35   Maleiya Pergola M.D on 01/05/2019 at 11:59 AM  Between 7am to 7pm - Pager - [phone]  After 7pm go to  [email or web address] - password  [facility] -  Office  [phone]

## 2019-01-05 NOTE — Progress Notes (Addendum)
Tele called this nurse and stated remains difficult to pick up pt O2 sats on monitor. Explained to tele pt does have difficulty with picking up O2 sats with extremities.MD order received. Ok to d/c continuous pulse ox for now.

## 2019-01-05 NOTE — Discharge Instructions (Signed)

## 2019-01-05 NOTE — Progress Notes (Signed)
Son called to inquire about mother and if doctor had any updates. Informed son no medical updates at this time but will request MD to update you, son informed that patient continues to have axiety attacks. Son began to talk about pt coming home and wanted to know from nursing perspective how patient was. Pt son informed that d/t pt anxiety attacks staff was frequently with patient. Son asked did he have help at home. Son stated it's his girlfriend and self. Nurse confirmed so you and your girlfriend. Son stated yes she's pregnant and due in October. Son asked if they would be able to provide 24 hour care for patient especially when patient had anxiety attacks and has trouble breathing. Son then asked about skilled nursing facility. Nurse used therapeutic talking. Son states he has a lot to think about. Call ended. MD on unit-updated on patient and son status.

## 2019-01-06 DIAGNOSIS — I5023 Acute on chronic systolic (congestive) heart failure: Secondary | ICD-10-CM

## 2019-01-06 DIAGNOSIS — Z7189 Other specified counseling: Secondary | ICD-10-CM

## 2019-01-06 LAB — HEPATIC FUNCTION PANEL
ALT: 40 U/L (ref 0–44)
AST: 51 U/L — ABNORMAL HIGH (ref 15–41)
Albumin: 2.7 g/dL — ABNORMAL LOW (ref 3.5–5.0)
Alkaline Phosphatase: 168 U/L — ABNORMAL HIGH (ref 38–126)
Bilirubin, Direct: 0.5 mg/dL — ABNORMAL HIGH (ref 0.0–0.2)
Indirect Bilirubin: 1.1 mg/dL — ABNORMAL HIGH (ref 0.3–0.9)
Total Bilirubin: 1.6 mg/dL — ABNORMAL HIGH (ref 0.3–1.2)
Total Protein: 5.8 g/dL — ABNORMAL LOW (ref 6.5–8.1)

## 2019-01-06 LAB — BASIC METABOLIC PANEL
Anion gap: 14 (ref 5–15)
BUN: 38 mg/dL — ABNORMAL HIGH (ref 8–23)
CO2: 27 mmol/L (ref 22–32)
Calcium: 8.4 mg/dL — ABNORMAL LOW (ref 8.9–10.3)
Chloride: 96 mmol/L — ABNORMAL LOW (ref 98–111)
Creatinine, Ser: 1.71 mg/dL — ABNORMAL HIGH (ref 0.44–1.00)
GFR calc Af Amer: 33 mL/min — ABNORMAL LOW (ref >60)
GFR calc non Af Amer: 28 mL/min — ABNORMAL LOW (ref >60)
Glucose, Bld: 100 mg/dL — ABNORMAL HIGH (ref 70–99)
Potassium: 4.4 mmol/L (ref 3.5–5.1)
Sodium: 137 mmol/L (ref 135–145)

## 2019-01-06 LAB — GLUCOSE, CAPILLARY
Glucose-Capillary: 92 mg/dL (ref 70–99)
Glucose-Capillary: 144 mg/dL — ABNORMAL HIGH (ref 70–99)
Glucose-Capillary: 144 mg/dL — ABNORMAL HIGH (ref 70–99)
Glucose-Capillary: 140 mg/dL — ABNORMAL HIGH (ref 70–99)

## 2019-01-06 LAB — MAGNESIUM: Magnesium: 1.6 mg/dL — ABNORMAL LOW (ref 1.7–2.4)

## 2019-01-06 MED ORDER — CLONAZEPAM 0.5 MG PO TABS
0.2500 mg | ORAL_TABLET | Freq: Two times a day (BID) | ORAL | Status: DC | PRN
  Administered 2019-01-06 – 2019-01-07 (×2): 0.25 mg via ORAL
  Filled 2019-01-06 (×4): qty 1

## 2019-01-06 MED ORDER — METOPROLOL SUCCINATE ER 50 MG PO TB24
50.0000 mg | ORAL_TABLET | Freq: Every day | ORAL | Status: DC
  Administered 2019-01-06 – 2019-01-13 (×8): 50 mg via ORAL
  Filled 2019-01-06 (×8): qty 1

## 2019-01-06 MED ORDER — MAGNESIUM SULFATE 4 GM/100ML IV SOLN
4.0000 g | Freq: Once | INTRAVENOUS | Status: AC
  Administered 2019-01-06: 4 g via INTRAVENOUS
  Filled 2019-01-06: qty 100

## 2019-01-06 NOTE — Progress Notes (Signed)
PROGRESS NOTE                                                                                                                                                                                                             Patient Demographics:    Teresa [name], is a 78 y.o. female, DOB - 08-01-1940, ZOX:[id number]  Admit date - 12/28/2018   Admitting Physician Vashti Hey, MD  Outpatient Primary MD for the patient is [name], MD  LOS - 9  Outpatient Specialists: cardiology  No chief complaint on file.      Brief Narrative 78 year old female with history of hypertension, diabetes mellitus, coronary artery disease with chronic systolic CHF (EF of 81-19%), A. fib on anticoagulation and depression presented to the ED with increasing weakness.  Patient has started eating and drinking poorly since 10 days prior to admission after her daughter died from CHF.  Patient presented with hyperglycemia, hypertension, acute on chronic kidney injury and lactic acidosis. On the day following admission she was found to be increasingly lethargic with worsened acidosis (bicarb of 11 and pH of 7.2).  PCCM was consulted who recommended medical management.  She was started on bicarb drip.  Her hypertension and acute kidney injury was secondary to hypovolemia and dehydration with poor p.o. intake. Hospital course now prolonged with A. fib with RVR and pulmonary edema   Subjective:   Breathing better today but feels anxious.  Heart rate better controlled and medications adjusted.   Assessment  & Plan :   Principal problem Acute on chronic systolic CHF (HCC) Lasix now switched to torsemide 40 mg daily (this is her home dose).  I/O's not assessed accurately.  Metoprolol dose slowly increased for better rate control.  Not on ACE inhibitors/ARB due to her AKI.  If renal function and blood pressure continues to remain stable possibly  add low-dose. Cardiology following. Continue strict I's/O and monitor daily weight.  Active problems Persistent A. fib with RVR CHA2DS2/VASC of 6.  On Eliquis.  Currently on amiodarone, cardiology considering stopping this if no long-term plan for rhythm control.  She was cardioverted in March with return to A. fib 2 months later when she got admitted.  Was also given digoxin initially, now discontinued. Was being rate controlled with low-dose metoprolol 12.5 mg every 6 hours, now switched to 50  mg daily (patient on 100 mg daily at home).  Acute on chronic kidney disease stage III (HCC) Baseline creatinine of 1.8-2.  Suspect secondary to hypovolemia and hypotension.  Received IV fluids.  Creatinine was 4.8 on admission, now improved to baseline at 1.71 today.  Monitor closely.  Acute metabolic encephalopathy/hepatic encephalopathy Noted for elevated ammonia of 90 and received lactulose.  Also contributed by hypotension and dehydration.  Encephalopathy has resolved but patient seems depressed and anxious.  Added low-dose Klonopin.  Acute metabolic acidosis Possibly secondary to ATN.  Resolved with bicarb drip.  Acute respiratory failure with hypoxia Secondary to fluid overload Now improving with diuresis.  Hypoglycemia Secondary to poor p.o. intake.  Now resolved  Hypothyroidism Continue Synthroid.  Levels normal.  Acute gouty arthritis (right big toe). Improved with 3-day course of prednisone.  Hyperkalemia Received bicarbonate Lokelma.  Now resolved.  Hypomagnesemia Replenished  Transaminitis Possibly shock liver.  Now resolving.  Coag negative blood culture Suspect contaminant.  Received  5 days of antibiotic.   Goals of care Has multiple comorbidities and complicated hospital course.  Palliative care team consulted this admission.  I discussed with son in detail on the phone on 7/22.  He is undecided if he wants to send patient to SNF (PT feels patient needs SNF versus  24-hour supervision).  He feels guilty that he believed patient to deteriorate if he sends her to SNF and that he will not get a chance to visit her there.  He is also unable to provide 24-hour supervision at home since both he and his girlfriend work and have a Armed forces logistics/support/administrative officer. He wished to speak with palliative care today and I have informed palliative care who will call him. He wanted patient to receive full scope of treatment for now but not receive prolonged mechanical ventilation or be on a vegetative state (reported these were patient's wishes).   Code Status : Full code   Family Communication  : Discussed with son on the phone on 7/22  Disposition Plan  :  once CHF and heart rate better controlled.  Possibly needs SNF  Barriers For Discharge : Active symptoms  Consults  : Cardiology, palliative care  Procedures  : 2D echo  DVT Prophylaxis  : Eliquis  Lab Results  Component Value Date   PLT 152 01/04/2019    Antibiotics  :    Anti-infectives (From admission, onward)   Start     Dose/Rate Route Frequency Ordered Stop   12/30/18 2200  ceFAZolin (ANCEF) IVPB 1 g/50 mL premix  Status:  Discontinued     1 g 100 mL/hr over 30 Minutes Intravenous Every 12 hours 12/30/18 1318 01/02/19 1301   12/29/18 1400  piperacillin-tazobactam (ZOSYN) IVPB 2.25 g  Status:  Discontinued     2.25 g 100 mL/hr over 30 Minutes Intravenous Every 8 hours 12/29/18 1316 12/30/18 1309   12/29/18 1309  vancomycin variable dose per unstable renal function (pharmacist dosing)  Status:  Discontinued      Does not apply See admin instructions 12/29/18 1309 12/30/18 1308   12/29/18 1200  ceFEPIme (MAXIPIME) 1 g in sodium chloride 0.9 % 100 mL IVPB  Status:  Discontinued     1 g 200 mL/hr over 30 Minutes Intravenous Every 24 hours 12/28/18 1242 12/29/18 1233   12/28/18 1245  ceFEPIme (MAXIPIME) 2 g in sodium chloride 0.9 % 100 mL IVPB     2 g 200 mL/hr over 30 Minutes Intravenous  Once 12/28/18 1233  12/28/18  1333   12/28/18 1245  metroNIDAZOLE (FLAGYL) IVPB 500 mg     500 mg 100 mL/hr over 60 Minutes Intravenous  Once 12/28/18 1233 12/28/18 1403   12/28/18 1245  vancomycin (VANCOCIN) IVPB 1000 mg/200 mL premix     1,000 mg 200 mL/hr over 60 Minutes Intravenous  Once 12/28/18 1233 12/28/18 1402        Objective:   Vitals:   01/05/19 1550 01/05/19 2310 01/06/19 0245 01/06/19 1051  BP: (!) 126/97 116/76  98/82  Pulse: (!) 109 67  (!) 102  Resp: (!) 36 (!) 22    Temp: 97.9 F (36.6 C) 98.5 F (36.9 C)    TempSrc: Oral     SpO2: 98% 91%    Weight:   105.2 kg   Height:        Wt Readings from Last 3 Encounters:  01/06/19 105.2 kg  12/01/18 88.5 kg  11/01/18 88.5 kg     Intake/Output Summary (Last 24 hours) at 01/06/2019 1432 Last data filed at 01/06/2019 1400 Gross per 24 hour  Intake 500 ml  Output 475 ml  Net 25 ml    Physical exam Fatigued, not in distress HEENT: Moist mucosa, supple neck Chest: Fine bibasilar crackles, unchanged from yesterday CVs: S1 and S2 irregular, no murmurs GI: Soft, nondistended, nontender Musculoskeletal: Warm, 1+ pitting edema bilaterally (unchanged) CNS: Sleepy but arousable and oriented, flat affect    Data Review:    CBC Recent Labs  Lab 12/31/18 0507 01/01/19 0416 01/02/19 0704 01/03/19 0705 01/04/19 0736  WBC 6.3 8.1 7.1 7.3 11.6*  HGB 14.2 13.6 13.0 11.9* 14.4  HCT 43.9 41.6 41.3 36.5 45.3  PLT PLATELET CLUMPS NOTED ON SMEAR, COUNT APPEARS DECREASED 97* 110* 92* 152  MCV 95.2 93.1 99.0 94.1 95.8  MCH 30.8 30.4 31.2 30.7 30.4  MCHC 32.3 32.7 31.5 32.6 31.8  RDW 15.8* 15.5 16.1* 15.7* 16.0*    Chemistries  Recent Labs  Lab 12/31/18 1127 01/01/19 0416 01/02/19 1026 01/03/19 0705 01/04/19 0735 01/06/19 0938  NA  --  140 139 138 135 137  K  --  3.1* 3.4* 3.8 4.5 4.4  CL  --  101 98 99 98 96*  CO2  --  27 30 30 27 27   GLUCOSE  --  105* 197* 99 138* 100*  BUN  --  45* 34* 34* 36* 38*  CREATININE  --   2.33* 1.81* 1.60* 1.65* 1.71*  CALCIUM  --  7.9* 8.5* 7.8* 8.6* 8.4*  MG  --   --   --  1.4* 1.7 1.6*  AST 268*  --   --  58*  --   --   ALT 294*  --   --  48*  --   --   ALKPHOS 142*  --   --  149*  --   --   BILITOT 1.9*  --   --  1.7*  --   --    ------------------------------------------------------------------------------------------------------------------ No results for input(s): CHOL, HDL, LDLCALC, TRIG, CHOLHDL, LDLDIRECT in the last 72 hours.  Lab Results  Component Value Date   HGBA1C 5.4 06/07/2018   ------------------------------------------------------------------------------------------------------------------ No results for input(s): TSH, T4TOTAL, T3FREE, THYROIDAB in the last 72 hours.  Invalid input(s): FREET3 ------------------------------------------------------------------------------------------------------------------ No results for input(s): VITAMINB12, FOLATE, FERRITIN, TIBC, IRON, RETICCTPCT in the last 72 hours.  Coagulation profile No results for input(s): INR, PROTIME in the last 168 hours.  No results for input(s): DDIMER in the last 72 hours.  Cardiac Enzymes No results for input(s): CKMB, TROPONINI, MYOGLOBIN in the last 168 hours.  Invalid input(s): CK ------------------------------------------------------------------------------------------------------------------    Component Value Date/Time   BNP 1,262.8 (H) 10/13/2018 0724   BNP 73.4 03/26/2016 1525    Inpatient Medications  Scheduled Meds:  allopurinol  100 mg Oral Daily   amiodarone  200 mg Oral Daily   apixaban  5 mg Oral BID   busPIRone  5 mg Oral BID   citalopram  20 mg Oral Daily   colchicine  0.6 mg Oral Daily   feeding supplement  1 Container Oral TID BM   Gerhardt's butt cream   Topical TID   insulin aspart  0-9 Units Subcutaneous TID WC   levothyroxine  100 mcg Oral Q0600   magnesium oxide  200 mg Oral BID   metoprolol succinate  50 mg Oral Daily    mirtazapine  7.5 mg Oral QHS   torsemide  40 mg Oral Daily   Continuous Infusions: PRN Meds:.clonazePAM, HYDROcodone-acetaminophen, lip balm, metoprolol tartrate, polyethylene glycol, sodium chloride flush  Micro Results Recent Results (from the past 240 hour(s))  Blood culture (routine x 2)     Status: Abnormal   Collection Time: 12/28/18 10:01 AM   Specimen: BLOOD  Result Value Ref Range Status   Specimen Description BLOOD LEFT ANTECUBITAL  Final   Special Requests   Final    BOTTLES DRAWN AEROBIC AND ANAEROBIC Blood Culture adequate volume   Culture  Setup Time   Final    GRAM POSITIVE COCCI ANAEROBIC BOTTLE ONLY CRITICAL RESULT CALLED TO, READ BACK BY AND VERIFIED WITH: PHARMD V BRYK [id number] MLM    Culture (A)  Final    STAPHYLOCOCCUS SPECIES (COAGULASE NEGATIVE) THE SIGNIFICANCE OF ISOLATING THIS ORGANISM FROM A SINGLE SET OF BLOOD CULTURES WHEN MULTIPLE SETS ARE DRAWN IS UNCERTAIN. PLEASE NOTIFY THE MICROBIOLOGY DEPARTMENT WITHIN ONE WEEK IF SPECIATION AND SENSITIVITIES ARE REQUIRED. Performed at [facility], [place] [address] [place], [place] [address]    Report Status 12/31/2018 FINAL  Final  Blood Culture ID Panel (Reflexed)     Status: Abnormal   Collection Time: 12/28/18 10:01 AM  Result Value Ref Range Status   Enterococcus species NOT DETECTED NOT DETECTED Final   Listeria monocytogenes NOT DETECTED NOT DETECTED Final   Staphylococcus species DETECTED (A) NOT DETECTED Final    Comment: Methicillin (oxacillin) susceptible coagulase negative staphylococcus. Possible blood culture contaminant (unless isolated from more than one blood culture draw or clinical case suggests pathogenicity). No antibiotic treatment is indicated for blood  culture contaminants. CRITICAL RESULT CALLED TO, READ BACK BY AND VERIFIED WITH: PHARMD V BRYK [id number] MLM    Staphylococcus aureus (BCID) NOT DETECTED NOT DETECTED Final   Methicillin resistance NOT DETECTED NOT DETECTED  Final   Streptococcus species NOT DETECTED NOT DETECTED Final   Streptococcus agalactiae NOT DETECTED NOT DETECTED Final   Streptococcus pneumoniae NOT DETECTED NOT DETECTED Final   Streptococcus pyogenes NOT DETECTED NOT DETECTED Final   Acinetobacter baumannii NOT DETECTED NOT DETECTED Final   Enterobacteriaceae species NOT DETECTED NOT DETECTED Final   Enterobacter cloacae complex NOT DETECTED NOT DETECTED Final   Escherichia coli NOT DETECTED NOT DETECTED Final   Klebsiella oxytoca NOT DETECTED NOT DETECTED Final   Klebsiella pneumoniae NOT DETECTED NOT DETECTED Final   Proteus species NOT DETECTED NOT DETECTED Final   Serratia marcescens NOT DETECTED NOT DETECTED Final   Haemophilus influenzae NOT DETECTED NOT DETECTED Final   Neisseria  meningitidis NOT DETECTED NOT DETECTED Final   Pseudomonas aeruginosa NOT DETECTED NOT DETECTED Final   Candida albicans NOT DETECTED NOT DETECTED Final   Candida glabrata NOT DETECTED NOT DETECTED Final   Candida krusei NOT DETECTED NOT DETECTED Final   Candida parapsilosis NOT DETECTED NOT DETECTED Final   Candida tropicalis NOT DETECTED NOT DETECTED Final    Comment: Performed at [facility], [place] [address], [place], [place] [address]  Blood culture (routine x 2)     Status: None   Collection Time: 12/28/18 10:06 AM   Specimen: BLOOD  Result Value Ref Range Status   Specimen Description BLOOD SITE NOT SPECIFIED  Final   Special Requests   Final    BOTTLES DRAWN AEROBIC ONLY Blood Culture results may not be optimal due to an inadequate volume of blood received in culture bottles   Culture   Final    NO GROWTH 5 DAYS Performed at [facility], [place] [address] [place], [place] [address]    Report Status 01/02/2019 FINAL  Final  SARS Coronavirus 2 (CEPHEID- Performed in [place] [facility] lab), Hosp Order     Status: None   Collection Time: 12/28/18 10:14 AM   Specimen: Nasopharyngeal Swab  Result Value Ref Range Status   SARS  Coronavirus 2 NEGATIVE NEGATIVE Final    Comment: (NOTE) If result is NEGATIVE SARS-CoV-2 target nucleic acids are NOT DETECTED. The SARS-CoV-2 RNA is generally detectable in upper and lower  respiratory specimens during the acute phase of infection. The lowest  concentration of SARS-CoV-2 viral copies this assay can detect is 250  copies / mL. A negative result does not preclude SARS-CoV-2 infection  and should not be used as the sole basis for treatment or other  patient management decisions.  A negative result may occur with  improper specimen collection / handling, submission of specimen other  than nasopharyngeal swab, presence of viral mutation(s) within the  areas targeted by this assay, and inadequate number of viral copies  (<250 copies / mL). A negative result must be combined with clinical  observations, patient history, and epidemiological information. If result is POSITIVE SARS-CoV-2 target nucleic acids are DETECTED. The SARS-CoV-2 RNA is generally detectable in upper and lower  respiratory specimens dur ing the acute phase of infection.  Positive  results are indicative of active infection with SARS-CoV-2.  Clinical  correlation with patient history and other diagnostic information is  necessary to determine patient infection status.  Positive results do  not rule out bacterial infection or co-infection with other viruses. If result is PRESUMPTIVE POSTIVE SARS-CoV-2 nucleic acids MAY BE PRESENT.   A presumptive positive result was obtained on the submitted specimen  and confirmed on repeat testing.  While 2019 novel coronavirus  (SARS-CoV-2) nucleic acids may be present in the submitted sample  additional confirmatory testing may be necessary for epidemiological  and / or clinical management purposes  to differentiate between  SARS-CoV-2 and other Sarbecovirus currently known to infect humans.  If clinically indicated additional testing with an alternate test    methodology [phone]) is advised. The SARS-CoV-2 RNA is generally  detectable in upper and lower respiratory sp ecimens during the acute  phase of infection. The expected result is Negative. Fact Sheet for Patients:  StrictlyIdeas.no Fact Sheet for Healthcare Providers: BankingDealers.co.za This test is not yet approved or cleared by the [place] FDA and has been authorized for detection and/or diagnosis of SARS-CoV-2 by FDA under an Emergency  Use Authorization (EUA).  This EUA will remain in effect (meaning this test can be used) for the duration of the COVID-19 declaration under Section 564(b)(1) of the Act, 21 U.S.C. section 360bbb-3(b)(1), unless the authorization is terminated or revoked sooner. Performed at [facility], [place] [address], [place], [place] [address]   Urine culture     Status: Abnormal   Collection Time: 12/28/18  2:15 PM   Specimen: In/Out Cath Urine  Result Value Ref Range Status   Specimen Description IN/OUT CATH URINE  Final   Special Requests   Final    NONE Performed at [facility], [place] [address], [place], [place] [address]    Culture 10,000 COLONIES/mL ESCHERICHIA COLI (A)  Final   Report Status 12/30/2018 FINAL  Final   Organism ID, Bacteria ESCHERICHIA COLI (A)  Final      Susceptibility   Escherichia coli - MIC*    AMPICILLIN >=32 RESISTANT Resistant     CEFAZOLIN <=4 SENSITIVE Sensitive     CEFTRIAXONE <=1 SENSITIVE Sensitive     CIPROFLOXACIN <=0.25 SENSITIVE Sensitive     GENTAMICIN <=1 SENSITIVE Sensitive     IMIPENEM <=0.25 SENSITIVE Sensitive     NITROFURANTOIN <=16 SENSITIVE Sensitive     TRIMETH/SULFA <=20 SENSITIVE Sensitive     AMPICILLIN/SULBACTAM 8 SENSITIVE Sensitive     PIP/TAZO <=4 SENSITIVE Sensitive     Extended ESBL NEGATIVE Sensitive     * 10,000 COLONIES/mL ESCHERICHIA COLI  MRSA PCR Screening     Status: None   Collection Time: 12/28/18  5:15 PM    Specimen: Nasopharyngeal  Result Value Ref Range Status   MRSA by PCR NEGATIVE NEGATIVE Final    Comment:        The GeneXpert MRSA Assay (FDA approved for NASAL specimens only), is one component of a comprehensive MRSA colonization surveillance program. It is not intended to diagnose MRSA infection nor to guide or monitor treatment for MRSA infections. Performed at [facility], [place] [address] [place], [place] [address]   Culture, blood (routine x 2)     Status: None   Collection Time: 12/29/18 10:00 AM   Specimen: BLOOD  Result Value Ref Range Status   Specimen Description BLOOD RIGHT ANTECUBITAL  Final   Special Requests   Final    BOTTLES DRAWN AEROBIC ONLY Blood Culture adequate volume   Culture   Final    NO GROWTH 5 DAYS Performed at [facility], [place] [address] [place], [place] [address]    Report Status 01/03/2019 FINAL  Final  Culture, blood (routine x 2)     Status: None   Collection Time: 12/29/18 10:12 AM   Specimen: BLOOD  Result Value Ref Range Status   Specimen Description BLOOD LEFT ANTECUBITAL  Final   Special Requests   Final    BOTTLES DRAWN AEROBIC ONLY Blood Culture adequate volume   Culture   Final    NO GROWTH 5 DAYS Performed at [facility], [place] [address], [place], [place] [address]    Report Status 01/03/2019 FINAL  Final    Radiology Reports Ct Abdomen Wo Contrast  Result Date: 12/28/2018 CLINICAL DATA:  Elevated liver function tests. Nausea vomiting. EXAM: CT ABDOMEN W ITHOUT CONTRAST TECHNIQUE: Multidetector CT imaging of the abdomen was performed following the standard protocol without IV contrast. COMPARISON:  August 30, 2015 FINDINGS: Lower chest: Partially visualized area of scarring/atelectasis in the lingula. Atelectasis versus airspace consolidation in  the dependent portion of the left lower lobe. Enlarged heart. Minimal pericardial thickening versus tiny effusion. Calcific atherosclerotic disease of the aorta and  coronary arteries, mild. Hepatobiliary: Normal appearance of the liver. The gallbladder is not abnormally distended but demonstrates a hyperdense appearance. Pancreas: Fatty replaced pancreas. Spleen: Normal in size without focal abnormality. Adrenals/Urinary Tract: Mild left adrenal thickening. Normal nonenhanced appearance of the kidneys. Stomach/Bowel: Stomach is within normal limits. No evidence of bowel wall thickening, distention, or inflammatory changes. Vascular/Lymphatic: Aortic atherosclerosis. No enlarged abdominal lymph nodes. Other: No abdominal wall hernia or abnormality. Musculoskeletal: Spondylosis the lumbosacral spine. IMPRESSION: 1. Unusual hyperdense appearance of the gallbladder. If the patient has had recent administration of IV contrast, this may represent vicarious excretion of contrast. Otherwise MRCP or ERCP may be considered for further investigation. 2. Enlarged heart. Minimal pericardial thickening versus tiny effusion. 3. Partially visualized area of scarring/atelectasis in the lingula. 4. Atelectasis versus airspace consolidation in the dependent portion of the left lower lobe. 5. Calcific atherosclerotic disease of the aorta and coronary arteries. Electronically Signed   By: Fidela Salisbury M.D.   On: 12/28/2018 14:22   US Renal  Result Date: 12/31/2018 CLINICAL DATA:  Acute renal insufficiency. EXAM: RENAL / URINARY TRACT ULTRASOUND COMPLETE COMPARISON:  CT 12/28/2018 FINDINGS: Right Kidney: Renal measurements: 10.9 x 4.1 x 4.0 cm = volume: 92 mL . Echogenicity within normal limits. No mass or hydronephrosis visualized. 4 mm indeterminate hyperechoic focus over the mid pole cortex which is not seen on recent noncontrast CT. Left Kidney: Renal measurements: 8.9 x 4.5 x 5.0 cm = volume: 104 mL. Echogenicity within normal limits. No mass or hydronephrosis visualized. Bladder: Appears normal for degree of bladder distention. IMPRESSION: Normal size kidneys without hydronephrosis.  Indeterminate 4 mm hyperechoic focus over the mid pole cortex of the right kidney which is not seen on recent noncontrast CT. Recommend follow-up ultrasound 6 months. Electronically Signed   By: Marin Olp M.D.   On: 12/31/2018 16:37   Dg Chest Port 1 View  Result Date: 01/04/2019 CLINICAL DATA:  Increased shortness of breath tonight. History of CHF. EXAM: PORTABLE CHEST 1 VIEW COMPARISON:  Radiograph 01/01/2019 FINDINGS: Cardiomegaly, unchanged. Aortic atherosclerosis. Progressive peribronchial thickening suspicious for pulmonary edema. Hazy bibasilar opacities consistent with pleural effusions. No pneumothorax. IMPRESSION: Development of pulmonary edema and bilateral pleural effusions consistent with CHF. Cardiomegaly is unchanged. Electronically Signed   By: Keith Rake M.D.   On: 01/04/2019 21:53   Dg Chest Port 1 View  Result Date: 01/01/2019 CLINICAL DATA:  Hypoxemia, shortness of breath with weakness today. EXAM: PORTABLE CHEST 1 VIEW COMPARISON:  Chest x-rays dated 12/28/2018 and 10/15/2018. FINDINGS: Chronic opacity at the LEFT lung base presumed scarring/atelectasis based on recent CT abdomen of 12/28/2018. No new lung findings. No pneumothorax seen. Stable cardiomegaly. IMPRESSION: Stable chest x-ray. No evidence of acute cardiopulmonary abnormality. No evidence of pneumonia or pulmonary edema. Electronically Signed   By: Franki Cabot M.D.   On: 01/01/2019 08:13   Dg Chest Port 1 View  Result Date: 12/28/2018 CLINICAL DATA:  Generalized weakness for 3 days. EXAM: PORTABLE CHEST 1 VIEW COMPARISON:  10/15/2018 FINDINGS: The heart is enlarged but stable. There is tortuosity and calcification of the thoracic aorta. The lungs are grossly clear. No infiltrates, edema or effusions. The bony thorax is intact. IMPRESSION: Stable cardiac enlargement but no acute pulmonary findings. Electronically Signed   By: Marijo Sanes M.D.   On: 12/28/2018 10:40   US Abdomen Limited Ruq  Result Date:  12/29/2018 CLINICAL DATA:  Abnormal transaminase level. EXAM: ULTRASOUND ABDOMEN LIMITED RIGHT UPPER QUADRANT COMPARISON:  CT scan of December 28, 2018. FINDINGS: Gallbladder: No gallstones or wall thickening visualized. No sonographic Murphy sign noted by sonographer. Common bile duct: Diameter: 4 mm which is within normal limits. Liver: No focal lesion identified. Increased echogenicity of hepatic parenchyma is noted suggesting hepatic steatosis or possibly other diffuse hepatocellular disease. Portal vein is patent on color Doppler imaging with normal direction of blood flow towards the liver. IMPRESSION: Mildly increased echogenicity of hepatic parenchyma is noted suggesting hepatic steatosis or other diffuse hepatocellular disease. No other abnormality seen in the right upper quadrant of the abdomen. Electronically Signed   By: Marijo Conception M.D.   On: 12/29/2018 12:08    Time Spent in minutes 35   Maudie Shingledecker M.D on 01/06/2019 at 2:32 PM  Between 7am to 7pm - Pager - [phone]  After 7pm go to [email or web address] - password [facility]  [facility] -  Office  [phone]

## 2019-01-06 NOTE — Plan of Care (Signed)
  Problem: Health Behavior/Discharge Planning: Goal: Ability to manage health-related needs will improve Outcome: Not Progressing   

## 2019-01-06 NOTE — Progress Notes (Signed)
Pt resting, no distress noted.

## 2019-01-06 NOTE — Progress Notes (Signed)
This chaplain responded to RN referral for Pt. spiritual care.  At the time of the visit, the chaplain observed the Pt. resting comfortably and chose to F/U at a later time.

## 2019-01-06 NOTE — Progress Notes (Signed)
   01/06/19 1300  Clinical Encounter Type  Visited With Patient  Visit Type Follow-up  Referral From Chaplain  Consult/Referral To Chaplain  This chaplain responded to a RN referral to be pastorally present with the Pt.  The chaplain was appreciative of the communication with the Pt. RN-Chailendra before entering the Pt. Room.  The chaplain listened to the Pt. as she questioned her healthcare and reflected on her family.  The Pt. thanked the chaplain for sitting with her before the chaplain left the room.  The chaplain is available for F/U spiritual care as needed.

## 2019-01-06 NOTE — Plan of Care (Signed)
  Problem: Clinical Measurements: Goal: Respiratory complications will improve Outcome: Progressing   Problem: Health Behavior/Discharge Planning: Goal: Ability to manage health-related needs will improve Outcome: Not Progressing   Problem: Clinical Measurements: Goal: Cardiovascular complication will be avoided Outcome: Not Progressing

## 2019-01-06 NOTE — Progress Notes (Signed)
Physical Therapy Treatment Patient Details Name: Teresa [name] MRN: [id number] DOB: 04/27/1941 Today's Date: 01/06/2019    History of Present Illness Pt adm with weakness, hypotension, acute on chronic renal failure. 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 PMH - afib, chf, anxiety, dm, cad,     PT Comments    Pt continues to require extensive assistance for all mobility. Per notes son is now willing for pt to go to SNF.    Follow Up Recommendations  Supervision/Assistance - 24 hour;SNF     Equipment Recommendations  Wheelchair (measurements PT);Wheelchair cushion (measurements PT);Hospital bed    Recommendations for Other Services       Precautions / Restrictions Precautions Precautions: Fall Restrictions Weight Bearing Restrictions: No    Mobility  Bed Mobility Overal bed mobility: Needs Assistance Bed Mobility: Sit to Supine       Sit to supine: +2 for physical assistance;Max assist   General bed mobility comments: Assist to lower trunk and bring legs back up into the bed  Transfers Overall transfer level: Needs assistance Equipment used: Ambulation equipment used Transfers: Sit to/from Stand Sit to Stand: Mod assist;+2 physical assistance         General transfer comment: Assist to bring hips up and for balance. Verbal/tactile cues to extend hips. Used Stedy for chair to bed. Performed sit to stand x 3 in [place]  Ambulation/Gait                 Stairs             Wheelchair Mobility    Modified Rankin (Stroke Patients Only)       Balance Overall balance assessment: Needs assistance Sitting-balance support: No upper extremity supported;Feet supported Sitting balance-Leahy Scale: Fair     Standing balance support: Bilateral upper extremity supported Standing balance-Leahy Scale: Poor Standing balance comment: Stedy and min assist for static standing. Pt stood x 3 minutes for pericare on initial stand and 30 -40 sec on subsequent  stands.                             Cognition Arousal/Alertness: Awake/alert Behavior During Therapy: WFL for tasks assessed/performed Overall Cognitive Status: No family/caregiver present to determine baseline cognitive functioning Area of Impairment: Following commands;Safety/judgement;Problem solving                       Following Commands: Follows multi-step commands with increased time;Follows multi-step commands consistently Safety/Judgement: Decreased awareness of safety;Decreased awareness of deficits   Problem Solving: Slow processing;Requires verbal cues;Requires tactile cues        Exercises      General Comments        Pertinent Vitals/Pain Pain Assessment: Faces Faces Pain Scale: Hurts a little bit Pain Location: rt foot Pain Descriptors / Indicators: Sore;Grimacing;Guarding Pain Intervention(s): Limited activity within patient's tolerance;Monitored during session;Repositioned    Home Living                      Prior Function            PT Goals (current goals can now be found in the care plan section) Acute Rehab PT Goals Patient Stated Goal: no goal stated Progress towards PT goals: Progressing toward goals    Frequency    Min 3X/week      PT Plan Discharge plan needs to be updated    Co-evaluation  AM-PAC PT "6 Clicks" Mobility   Outcome Measure  Help needed turning from your back to your side while in a flat bed without using bedrails?: Total Help needed moving from lying on your back to sitting on the side of a flat bed without using bedrails?: A Lot Help needed moving to and from a bed to a chair (including a wheelchair)?: Total Help needed standing up from a chair using your arms (e.g., wheelchair or bedside chair)?: A Lot Help needed to walk in hospital room?: Total Help needed climbing 3-5 steps with a railing? : Total 6 Click Score: 8    End of Session Equipment Utilized During  Treatment: Oxygen Activity Tolerance: Patient limited by fatigue Patient left: with call bell/phone within reach;in bed;with nursing/sitter in room Nurse Communication: Mobility status;Need for lift equipment(nurse assisted) PT Visit Diagnosis: Other abnormalities of gait and mobility (R26.89);Pain Pain - Right/Left: Right Pain - part of body: Ankle and joints of foot     Time: 5364-6803 PT Time Calculation (min) (ACUTE ONLY): 29 min  Charges:  $Therapeutic Activity: 23-37 mins                     [place] Pager [phone] Office [place] 01/06/2019, 4:36 PM

## 2019-01-06 NOTE — Progress Notes (Signed)
Occupational Therapy Treatment Patient Details Name: Teresa [name] MRN: [id number] DOB: Aug 26, 1940 Today's Date: 01/06/2019    History of present illness Pt adm with weakness, hypotension, acute on chronic renal failure. 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 PMH - afib, chf, anxiety, dm, cad,    OT comments  Pt progressing towards occupational therapy goals with maximal encouragement for participation this session. Pt received in recliner chair and engaged in B UE strengthening exercises with  Level 1 theraband with min cuing for proper technique. Pt engaged in 3 sit <>stand with maximal encouragement and only standing for 20-30 seconds each time. Pt continues to benefit from OT intervention.    Follow Up Recommendations  SNF;Home health OT;Supervision/Assistance - 24 hour    Equipment Recommendations  None recommended by OT    Recommendations for Other Services      Precautions / Restrictions Precautions Precautions: Fall Restrictions Weight Bearing Restrictions: No       Mobility Bed Mobility      General bed mobility comments: seated in recliner chair upon entering the room  Transfers Overall transfer level: Needs assistance Equipment used: Rolling walker (2 wheeled) Transfers: Sit to/from Stand Sit to Stand: Mod assist         General transfer comment: mod lifting assistance from recliner chair with RW x 3 reps    Balance Overall balance assessment: Needs assistance Sitting-balance support: No upper extremity supported;Feet supported Sitting balance-Leahy Scale: Fair     Standing balance support: Bilateral upper extremity supported Standing balance-Leahy Scale: Poor Standing balance comment: walker and min assist for static standing         ADL either performed or assessed with clinical judgement        Vision Baseline Vision/History: Wears glasses Wears Glasses: At all times Patient Visual Report: No change from baseline             Cognition Arousal/Alertness: Awake/alert Behavior During Therapy: WFL for tasks assessed/performed Overall Cognitive Status: No family/caregiver present to determine baseline cognitive functioning Area of Impairment: Following commands;Safety/judgement;Problem solving      Following Commands: Follows multi-step commands with increased time;Follows multi-step commands consistently Safety/Judgement: Decreased awareness of safety;Decreased awareness of deficits   Problem Solving: Slow processing;Requires verbal cues                     Pertinent Vitals/ Pain       Pain Assessment: Faces Faces Pain Scale: No hurt      Frequency  Min 2X/week        Progress Toward Goals  OT Goals(current goals can now be found in the care plan section)  Progress towards OT goals: Progressing toward goals  Acute Rehab OT Goals Patient Stated Goal: no goal stated  Plan Discharge plan remains appropriate       AM-PAC OT "6 Clicks" Daily Activity     Outcome Measure   Help from another person eating meals?: A Little Help from another person taking care of personal grooming?: A Little Help from another person toileting, which includes using toliet, bedpan, or urinal?: A Lot Help from another person bathing (including washing, rinsing, drying)?: A Lot Help from another person to put on and taking off regular upper body clothing?: A Little Help from another person to put on and taking off regular lower body clothing?: A Lot 6 Click Score: 15    End of Session Equipment Utilized During Treatment: Rolling walker  OT Visit Diagnosis: Unsteadiness on feet (  R26.81);Other abnormalities of gait and mobility (R26.89);Muscle weakness (generalized) (M62.81);Feeding difficulties (R63.3);Other symptoms and signs involving cognitive function   Activity Tolerance Patient tolerated treatment well   Patient Left with call bell/phone within reach;in chair;with chair alarm set   Nurse Communication  Mobility status        Time: 1400-1425 OT Time Calculation (min): 25 min  Charges: OT General Charges $OT Visit: 1 Visit OT Treatments $Therapeutic Activity: 8-22 mins $Therapeutic Exercise: 8-22 mins   Darleen Crocker P 01/06/2019, 3:20 PM

## 2019-01-06 NOTE — Progress Notes (Signed)
Progress Note  Patient Name: BELISSA KOOY Date of Encounter: 01/06/2019  Primary Cardiologist: Minus Breeding, MD   Subjective   No acute overnight events.  I spent 15 minutes with the patient discussing options (cardioversion, etc). She could not grasp the conversation entirely but did show concern when I discussed anesthesia and cardioversion. I offered to contact her son, which she appreciated.  I had a speakerphone conversation with Lorenz Coaster in the patient's room with her for 35 minutes. We discussed multiple points: -I reviewed her current HF and afib status -I discussed options for afib at length. I summarized into rate and rhythm control strategies. He though she did feel better after cardioversion in March, but she then returned to afib after a fall. Has since been on amiodarone. Offered another attempt at cardioversion, especially given her low EF. Did review that she make not convert or may not remain in SR even with amiodarone and cardioversion. If we instead pursue a rate control strategy, then I would stop the amiodarone to minimize risk of medication interactions. -We discussed long term prognosis and planning. Reviewed the natural history of HF. He is not sure yet if he wants her to go to SNF or home--he would ideally like to have her at home but will need help. -We also reviewed code status and the difference between full code, DNR, and comfort care.   He plans to review all of this again with his mother when he comes to visit this evening and will let us know what they decide.  Inpatient Medications    Scheduled Meds: . allopurinol  100 mg Oral Daily  . amiodarone  200 mg Oral Daily  . apixaban  5 mg Oral BID  . busPIRone  5 mg Oral BID  . citalopram  20 mg Oral Daily  . colchicine  0.6 mg Oral Daily  . feeding supplement  1 Container Oral TID BM  . Gerhardt's butt cream   Topical TID  . insulin aspart  0-9 Units Subcutaneous TID WC  . levothyroxine  100 mcg  Oral Q0600  . magnesium oxide  200 mg Oral BID  . metoprolol succinate  50 mg Oral Daily  . mirtazapine  7.5 mg Oral QHS  . torsemide  40 mg Oral Daily   Continuous Infusions:  PRN Meds: HYDROcodone-acetaminophen, lip balm, metoprolol tartrate, polyethylene glycol, sodium chloride flush   Vital Signs    Vitals:   01/05/19 1317 01/05/19 1550 01/05/19 2310 01/06/19 0245  BP: (!) 112/100 (!) 126/97 116/76   Pulse: (!) 109 (!) 109 67   Resp:  (!) 36 (!) 22   Temp:  97.9 F (36.6 C) 98.5 F (36.9 C)   TempSrc:  Oral    SpO2: 99% 98% 91%   Weight:    105.2 kg  Height:        Intake/Output Summary (Last 24 hours) at 01/06/2019 1101 Last data filed at 01/06/2019 0936 Gross per 24 hour  Intake -  Output 475 ml  Net -475 ml   Last 3 Weights 01/06/2019 01/05/2019 01/04/2019  Weight (lbs) 231 lb 14.8 oz 221 lb 1.9 oz 229 lb 11.5 oz  Weight (kg) 105.2 kg 100.3 kg 104.2 kg      Telemetry    Afib, rates between 60- 100- Personally Reviewed  ECG    No new ECG tracing since 12/30/2018. - Personally Reviewed  Physical Exam   GEN: Well nourished, well developed in no acute distress. Very hard of  hearing. Falls asleep easily HEENT: Normal NECK: JVD difficult to visualize CARDIAC: irregularly iregular rhythm, normal S1 and S2, no murmurs, rubs, gallops. Radial pulses 2+ bilaterally. RESPIRATORY:  Clear to auscultation without rales, wheezing or rhonchi  ABDOMEN: Soft, non-tender, non-distended MUSCULOSKELETAL:  Bilateral pitting and nonpitting edema SKIN: Warm and dry NEUROLOGIC:  Alert and oriented x 3 PSYCHIATRIC:  Normal affect   Labs    High Sensitivity Troponin:   Recent Labs  Lab 12/28/18 1000 12/28/18 1220  TROPONINIHS 49* 31*      Cardiac EnzymesNo results for input(s): TROPONINI in the last 168 hours. No results for input(s): TROPIPOC in the last 168 hours.   Chemistry Recent Labs  Lab 12/31/18 1127  01/03/19 0705 01/04/19 0735 01/06/19 0938  NA  --    < >  138 135 137  K  --    < > 3.8 4.5 4.4  CL  --    < > 99 98 96*  CO2  --    < > 30 27 27   GLUCOSE  --    < > 99 138* 100*  BUN  --    < > 34* 36* 38*  CREATININE  --    < > 1.60* 1.65* 1.71*  CALCIUM  --    < > 7.8* 8.6* 8.4*  PROT 4.9*  --  4.4*  --   --   ALBUMIN 2.6*  --  2.2*  --   --   AST 268*  --  58*  --   --   ALT 294*  --  48*  --   --   ALKPHOS 142*  --  149*  --   --   BILITOT 1.9*  --  1.7*  --   --   GFRNONAA  --    < > 31* 29* 28*  GFRAA  --    < > 35* 34* 33*  ANIONGAP  --    < > 9 10 14    < > = values in this interval not displayed.     Hematology Recent Labs  Lab 01/02/19 0704 01/03/19 0705 01/04/19 0736  WBC 7.1 7.3 11.6*  RBC 4.17 3.88 4.73  HGB 13.0 11.9* 14.4  HCT 41.3 36.5 45.3  MCV 99.0 94.1 95.8  MCH 31.2 30.7 30.4  MCHC 31.5 32.6 31.8  RDW 16.1* 15.7* 16.0*  PLT 110* 92* 152    BNPNo results for input(s): BNP, PROBNP in the last 168 hours.   DDimer No results for input(s): DDIMER in the last 168 hours.   Radiology    Dg Chest Port 1 View  Result Date: 01/04/2019 CLINICAL DATA:  Increased shortness of breath tonight. History of CHF. EXAM: PORTABLE CHEST 1 VIEW COMPARISON:  Radiograph 01/01/2019 FINDINGS: Cardiomegaly, unchanged. Aortic atherosclerosis. Progressive peribronchial thickening suspicious for pulmonary edema. Hazy bibasilar opacities consistent with pleural effusions. No pneumothorax. IMPRESSION: Development of pulmonary edema and bilateral pleural effusions consistent with CHF. Cardiomegaly is unchanged. Electronically Signed   By: Keith Rake M.D.   On: 01/04/2019 21:53    Cardiac Studies   Echocardiogram 12/29/2018: Impressions: 1. The left ventricle has severely reduced systolic function, with an ejection fraction of 20-25%. The cavity size was normal. Left ventricular diastolic function could not be evaluated secondary to atrial fibrillation.  2. The right ventricle has low normal systolic function. The cavity was  moderately enlarged. There is no increase in right ventricular wall thickness.  3. There is interventricular flattening during diastolic indicative of  RV volume overload.  4. Left atrial size was mild-moderately dilated.  5. The LA appears larger than the biplane measurements suggest.  6. Right atrial size was moderately dilated.  7. Trivial pericardial effusion is present.  8. Mitral valve regurgitation is moderate by color flow Doppler. No evidence of mitral valve stenosis.  9. Tricuspid valve regurgitation is moderate-severe. 10. Aortic valve regurgitation is trivial by color flow Doppler. No stenosis of the aortic valve. 11. The aortic root and ascending aorta are normal in size and structure. 12. The inferior vena cava was normal in size with <50% respiratory variability. 13. The interatrial septum was not assessed.  Patient Profile   Ms. Alkhatib is a 78 y.o. female with a history of chronic systolic heart failure, moderate-severe MR, mild-moderate TR, mild AI, persistent atrial fibrillation, type II diabetes, chronic kidney disease, hypothyroidism who is followed at the request of Dr. Lorane Gell. Cathlean Sauer for acute on chronic heart failure and hypotension (hypotension now resolved).  Assessment & Plan    Acute on Chronic Systolic CHF - Updated Echo this admission showed EF dropped to 20-25% (down from 40-45% on TEE in 08/2018) as well as biatrial enlargement, moderate mitral regurgitation, and moderate to severe tricuspid regurgitation. - PO Lasix has transitioned to Torsemide 40mg  daily (home dose) with first dose starting this morning given concern of gut edema affecting response to Lasix. - Very difficult to track diuresis response. If charted wt correct, she would be up 10 lbs overnight. This suggests weight trend inaccurate, and I/O not well tracked.  - No ACEi/ARB/ARNI/MRA right now as she presented with AKI but as creatinine improves and BP remains stable, may be able to add low dose.  However, need to be cautious as she presented with hypotension. Cr 1.71 today - With patient's reduced EF and tricuspid regurgitation, she will be very prone to volume overload. Her Albumin has also been low which will make her prone to edema. - Continue fluid restriction at 1.5 daily. Continue to monitor daily weights, strict I/O's, and renal function.  Persistent Atrial Fibrillation - Rate control much improved, though appears to trend back up as short term metoprolol wears of. Will consolidate today to 50 mg metoprolol succinate daily. She was on 100 mg daily as outpatient--titrating gradually to avoid hypotension.  - Continue Amiodarone 200mg  daily. Would consider stopping this if no long term plans for rhythm control. Last cardioversion 08/2018 with return to afib on admission 10/2018. I worry about long Qtc and medication interactions.  - Digoxin was added for tachycardia but not continued.  - Continue Eliquis for anticoagualtion. CHA2DS2-VASC = 6. Does not meet criteria for reduced dose.  Overall I think she has a complicated cardiovascular status. On review from Dr. Elmarie Shiley not on 10/20/18 during prior admission, plan had been to pursue palliative care (even comfort care). Per notes, family debating home care vs. SNF. With her EF and TR, she is prone to volume overload and readmission. I discussed this at length with patient and her son today via conference call. Discussed options for cardioversion (rate vs. Rhythm control). Son will let us know what he decides.  No Significant Change Today: Elevated Troponin  - High sensitivity troponin minimally elevated and flat at 49 >> 31.  - Not consistent with ACS. Consistent with demand ischemia from hypotension and acute systolic CHF. - No additional ischemic work-up planned.  Acute on Chronic Kidney Disease - Creatinine 4.82 on admission and has been improving. Creatinine 1.65 on 7/21. Today's BMET pending.  -  Renal ultrasound showed indeterminate  32mm hyperechoic focus over the mid pole cortex of the right kidney. Repeat ultrasound recommended in 6 months.   Hypotension - Resolved - BP now stable. - Continue above medications.   Otherwise, per primary team Type 2 diabetes mellitus Hypothyroidism  Acute metabolic encephalopathy/hepatic encephalopathy Acute metabolic acidosis  Transaminitis  Acute gouty arthritis--with AKI, would be cautious with colchicine  TIME SPENT WITH PATIENT: >50 minutes of direct patient care. More than 50% of that time was spent on coordination of care and counseling regarding natural history of HF, code status, rate vs. Rhythm control options for management of afib.  Buford Dresser, MD, PhD [facility]   For questions or updates, please contact [place] Please consult [email or web address] for contact info under     Signed, Buford Dresser, MD  01/06/2019, 11:01 AM

## 2019-01-07 DIAGNOSIS — I4821 Permanent atrial fibrillation: Secondary | ICD-10-CM

## 2019-01-07 DIAGNOSIS — F3289 Other specified depressive episodes: Secondary | ICD-10-CM

## 2019-01-07 LAB — BASIC METABOLIC PANEL
Anion gap: 10 (ref 5–15)
BUN: 36 mg/dL — ABNORMAL HIGH (ref 8–23)
CO2: 30 mmol/L (ref 22–32)
Calcium: 7.7 mg/dL — ABNORMAL LOW (ref 8.9–10.3)
Chloride: 96 mmol/L — ABNORMAL LOW (ref 98–111)
Creatinine, Ser: 1.67 mg/dL — ABNORMAL HIGH (ref 0.44–1.00)
GFR calc Af Amer: 34 mL/min — ABNORMAL LOW (ref >60)
GFR calc non Af Amer: 29 mL/min — ABNORMAL LOW (ref >60)
Glucose, Bld: 87 mg/dL (ref 70–99)
Potassium: 3.4 mmol/L — ABNORMAL LOW (ref 3.5–5.1)
Sodium: 136 mmol/L (ref 135–145)

## 2019-01-07 LAB — GLUCOSE, CAPILLARY
Glucose-Capillary: 61 mg/dL — ABNORMAL LOW (ref 70–99)
Glucose-Capillary: 72 mg/dL (ref 70–99)
Glucose-Capillary: 107 mg/dL — ABNORMAL HIGH (ref 70–99)
Glucose-Capillary: 92 mg/dL (ref 70–99)
Glucose-Capillary: 122 mg/dL — ABNORMAL HIGH (ref 70–99)

## 2019-01-07 NOTE — Plan of Care (Signed)
  Problem: Clinical Measurements: Goal: Respiratory complications will improve Outcome: Progressing   Problem: Clinical Measurements: Goal: Cardiovascular complication will be avoided Outcome: Progressing   

## 2019-01-07 NOTE — Progress Notes (Addendum)
PROGRESS NOTE                                                                                                                                                                                                             Patient Demographics:    Teresa [name], is a 78 y.o. female, DOB - 11/27/1940, OVF:[id number]  Admit date - 12/28/2018   Admitting Physician Vashti Hey, MD  Outpatient Primary MD for the patient is [name], MD  LOS - 10  Outpatient Specialists: cardiology  No chief complaint on file.      Brief Narrative 78 year old female with history of hypertension, diabetes mellitus, coronary artery disease with chronic systolic CHF (EF of 84-16%), A. fib on anticoagulation and depression presented to the ED with increasing weakness.  Patient has started eating and drinking poorly since 10 days prior to admission after her daughter died from CHF.  Patient presented with hyperglycemia, hypertension, acute on chronic kidney injury and lactic acidosis. On the day following admission she was found to be increasingly lethargic with worsened acidosis (bicarb of 11 and pH of 7.2).  PCCM was consulted who recommended medical management.  She was started on bicarb drip.  Her hypertension and acute kidney injury was secondary to hypovolemia and dehydration with poor p.o. intake. Hospital course now prolonged with A. fib with RVR and pulmonary edema   Subjective:   Son at bedside.  Patient tired and wants to go home.  Had long discussion about her deconditioning and home situation.   Assessment  & Plan :   Principal problem Acute on chronic systolic CHF (HCC) Lasix now switched to torsemide 40 mg daily (this is her home dose).  Good urine output of 1675 mL past 24 hours. not on ACE inhibitors/ARB due to her AKI.  Continue metoprolol.  If renal function and blood pressure continues to remain stable possibly add  low-dose. Cardiology following.  Active problems Persistent A. fib with RVR CHA2DS2/VASC of 6.  On Eliquis.  Currently on amiodarone, cardiology considering stopping this if no long-term plan for rhythm control.  She was cardioverted in March with return to A. fib 2 months later when she got admitted.  Was also given digoxin initially, now discontinued.  Metoprolol dose increased to 50 mg for better rate control (100 mg daily).  Getting lower dose  due to hypotension on presentation.  Acute on chronic kidney disease stage III (HCC) Baseline creatinine of 1.8-2.  Suspect secondary to hypovolemia and hypotension.  Received IV fluids.  Creatinine was 4.8 on admission, now improved to baseline.   Acute metabolic encephalopathy/hepatic encephalopathy Noted for elevated ammonia of 90 and received lactulose.  Also contributed by hypotension and dehydration.  Encephalopathy has resolved but patient seems depressed and anxious.  Added low-dose Klonopin.   Major depression Patient is significantly depressed and withdrawn since her daughter's death few weeks back.  She is already on Remeron, buspirone and Celexa without much improvement.  I will consult psych to assist with adjusting her medications.  Acute metabolic acidosis Possibly secondary to ATN.  Resolved with bicarb drip.  Acute respiratory failure with hypoxia Secondary to fluid overload Now improving with diuresis.  Hypoglycemia Secondary to poor p.o. intake.  Now resolved  Hypothyroidism Continue Synthroid.  Levels normal.  Acute gouty arthritis (right big toe). Improved with 3-day course of prednisone.  Hyperkalemia Received bicarbonate Lokelma.  Now resolved.  Hypomagnesemia Replenished  Transaminitis Possibly shock liver.  Now resolving.  Coag negative blood culture Suspect contaminant.  Received  5 days of antibiotic.   Goals of care Patient is significantly deconditioned.  PT recommends SNF.  Son is very worried  that she will continue to decline when she gets to SNF since patient is strongly wanting to go home.  He has limited resource to provide 24-hour supervision for her at home.  Patient son was at bedside today and we had an extensive discussion (myself and Ms. Bullard from palliative care).  We will ask PT to evaluate her again and see if patient would benefit from CIR evaluation.  We tried to convince both the son and the patient that she would benefit from going to short-term rehab for skilled care for few weeks.   Code Status : Full code   Family Communication  : Discussed with son at bedside  Disposition Plan  : Once CHF and heart rate continues to be better controlled.  Possible SNF early next week  Barriers For Discharge : Active symptoms  Consults  : Cardiology, palliative care  Procedures  : 2D echo  DVT Prophylaxis  : Eliquis  Lab Results  Component Value Date   PLT 152 01/04/2019    Antibiotics  :    Anti-infectives (From admission, onward)   Start     Dose/Rate Route Frequency Ordered Stop   12/30/18 2200  ceFAZolin (ANCEF) IVPB 1 g/50 mL premix  Status:  Discontinued     1 g 100 mL/hr over 30 Minutes Intravenous Every 12 hours 12/30/18 1318 01/02/19 1301   12/29/18 1400  piperacillin-tazobactam (ZOSYN) IVPB 2.25 g  Status:  Discontinued     2.25 g 100 mL/hr over 30 Minutes Intravenous Every 8 hours 12/29/18 1316 12/30/18 1309   12/29/18 1309  vancomycin variable dose per unstable renal function (pharmacist dosing)  Status:  Discontinued      Does not apply See admin instructions 12/29/18 1309 12/30/18 1308   12/29/18 1200  ceFEPIme (MAXIPIME) 1 g in sodium chloride 0.9 % 100 mL IVPB  Status:  Discontinued     1 g 200 mL/hr over 30 Minutes Intravenous Every 24 hours 12/28/18 1242 12/29/18 1233   12/28/18 1245  ceFEPIme (MAXIPIME) 2 g in sodium chloride 0.9 % 100 mL IVPB     2 g 200 mL/hr over 30 Minutes Intravenous  Once 12/28/18 1233 12/28/18 1333  12/28/18 1245   metroNIDAZOLE (FLAGYL) IVPB 500 mg     500 mg 100 mL/hr over 60 Minutes Intravenous  Once 12/28/18 1233 12/28/18 1403   12/28/18 1245  vancomycin (VANCOCIN) IVPB 1000 mg/200 mL premix     1,000 mg 200 mL/hr over 60 Minutes Intravenous  Once 12/28/18 1233 12/28/18 1402        Objective:   Vitals:   01/06/19 1608 01/06/19 2131 01/07/19 0556 01/07/19 0817  BP: 118/85 103/83  110/82  Pulse: 95 78  80  Resp: 16   18  Temp: 97.7 F (36.5 C) (!) 97.5 F (36.4 C)  98.1 F (36.7 C)  TempSrc: Oral Oral  Oral  SpO2: 99% 100%  100%  Weight:   99.2 kg   Height:        Wt Readings from Last 3 Encounters:  01/07/19 99.2 kg  12/01/18 88.5 kg  11/01/18 88.5 kg     Intake/Output Summary (Last 24 hours) at 01/07/2019 1316 Last data filed at 01/07/2019 0900 Gross per 24 hour  Intake 680 ml  Output 1200 ml  Net -520 ml   Physical exam Elderly female, fatigued with flat affect HEENT: Moist mucosa, supple neck Chest: Improved breath sounds bilaterally CVs: S1-S2 regular, no murmurs GI: Soft, nondistended, nontender Musculoskeletal: Warm, 1+ pitting edema bilaterally    Data Review:    CBC Recent Labs  Lab 01/01/19 0416 01/02/19 0704 01/03/19 0705 01/04/19 0736  WBC 8.1 7.1 7.3 11.6*  HGB 13.6 13.0 11.9* 14.4  HCT 41.6 41.3 36.5 45.3  PLT 97* 110* 92* 152  MCV 93.1 99.0 94.1 95.8  MCH 30.4 31.2 30.7 30.4  MCHC 32.7 31.5 32.6 31.8  RDW 15.5 16.1* 15.7* 16.0*    Chemistries  Recent Labs  Lab 01/02/19 1026 01/03/19 0705 01/04/19 0735 01/06/19 0938 01/06/19 1655 01/07/19 0238  NA 139 138 135 137  --  136  K 3.4* 3.8 4.5 4.4  --  3.4*  CL 98 99 98 96*  --  96*  CO2 30 30 27 27   --  30  GLUCOSE 197* 99 138* 100*  --  87  BUN 34* 34* 36* 38*  --  36*  CREATININE 1.81* 1.60* 1.65* 1.71*  --  1.67*  CALCIUM 8.5* 7.8* 8.6* 8.4*  --  7.7*  MG  --  1.4* 1.7 1.6*  --   --   AST  --  58*  --   --  51*  --   ALT  --  48*  --   --  40  --   ALKPHOS  --  149*  --   --   168*  --   BILITOT  --  1.7*  --   --  1.6*  --    ------------------------------------------------------------------------------------------------------------------ No results for input(s): CHOL, HDL, LDLCALC, TRIG, CHOLHDL, LDLDIRECT in the last 72 hours.  Lab Results  Component Value Date   HGBA1C 5.4 06/07/2018   ------------------------------------------------------------------------------------------------------------------ No results for input(s): TSH, T4TOTAL, T3FREE, THYROIDAB in the last 72 hours.  Invalid input(s): FREET3 ------------------------------------------------------------------------------------------------------------------ No results for input(s): VITAMINB12, FOLATE, FERRITIN, TIBC, IRON, RETICCTPCT in the last 72 hours.  Coagulation profile No results for input(s): INR, PROTIME in the last 168 hours.  No results for input(s): DDIMER in the last 72 hours.  Cardiac Enzymes No results for input(s): CKMB, TROPONINI, MYOGLOBIN in the last 168 hours.  Invalid input(s): CK ------------------------------------------------------------------------------------------------------------------    Component Value Date/Time   BNP 1,262.8 (H) 10/13/2018 9528  BNP 73.4 03/26/2016 1525    Inpatient Medications  Scheduled Meds:  allopurinol  100 mg Oral Daily   amiodarone  200 mg Oral Daily   apixaban  5 mg Oral BID   busPIRone  5 mg Oral BID   citalopram  20 mg Oral Daily   colchicine  0.6 mg Oral Daily   feeding supplement  1 Container Oral TID BM   Gerhardt's butt cream   Topical TID   insulin aspart  0-9 Units Subcutaneous TID WC   levothyroxine  100 mcg Oral Q0600   magnesium oxide  200 mg Oral BID   metoprolol succinate  50 mg Oral Daily   mirtazapine  7.5 mg Oral QHS   torsemide  40 mg Oral Daily   Continuous Infusions: PRN Meds:.clonazePAM, HYDROcodone-acetaminophen, lip balm, metoprolol tartrate, polyethylene glycol, sodium chloride  flush  Micro Results Recent Results (from the past 240 hour(s))  Urine culture     Status: Abnormal   Collection Time: 12/28/18  2:15 PM   Specimen: In/Out Cath Urine  Result Value Ref Range Status   Specimen Description IN/OUT CATH URINE  Final   Special Requests   Final    NONE Performed at [facility], [address] [place], [place] [address]    Culture 10,000 COLONIES/mL ESCHERICHIA COLI (A)  Final   Report Status 12/30/2018 FINAL  Final   Organism ID, Bacteria ESCHERICHIA COLI (A)  Final      Susceptibility   Escherichia coli - MIC*    AMPICILLIN >=32 RESISTANT Resistant     CEFAZOLIN <=4 SENSITIVE Sensitive     CEFTRIAXONE <=1 SENSITIVE Sensitive     CIPROFLOXACIN <=0.25 SENSITIVE Sensitive     GENTAMICIN <=1 SENSITIVE Sensitive     IMIPENEM <=0.25 SENSITIVE Sensitive     NITROFURANTOIN <=16 SENSITIVE Sensitive     TRIMETH/SULFA <=20 SENSITIVE Sensitive     AMPICILLIN/SULBACTAM 8 SENSITIVE Sensitive     PIP/TAZO <=4 SENSITIVE Sensitive     Extended ESBL NEGATIVE Sensitive     * 10,000 COLONIES/mL ESCHERICHIA COLI  MRSA PCR Screening     Status: None   Collection Time: 12/28/18  5:15 PM   Specimen: Nasopharyngeal  Result Value Ref Range Status   MRSA by PCR NEGATIVE NEGATIVE Final    Comment:        The GeneXpert MRSA Assay (FDA approved for NASAL specimens only), is one component of a comprehensive MRSA colonization surveillance program. It is not intended to diagnose MRSA infection nor to guide or monitor treatment for MRSA infections. Performed at [facility], [place] [address], [place], [place] [address]   Culture, blood (routine x 2)     Status: None   Collection Time: 12/29/18 10:00 AM   Specimen: BLOOD  Result Value Ref Range Status   Specimen Description BLOOD RIGHT ANTECUBITAL  Final   Special Requests   Final    BOTTLES DRAWN AEROBIC ONLY Blood Culture adequate volume   Culture   Final    NO GROWTH 5 DAYS Performed at [facility], [place] [address] [place], [place] [address]    Report Status 01/03/2019 FINAL  Final  Culture, blood (routine x 2)     Status: None   Collection Time: 12/29/18 10:12 AM   Specimen: BLOOD  Result Value Ref Range Status   Specimen Description BLOOD LEFT ANTECUBITAL  Final   Special Requests   Final    BOTTLES DRAWN AEROBIC ONLY Blood Culture  adequate volume   Culture   Final    NO GROWTH 5 DAYS Performed at [facility], [place] [address], [place], [place] [address]    Report Status 01/03/2019 FINAL  Final    Radiology Reports Ct Abdomen Wo Contrast  Result Date: 12/28/2018 CLINICAL DATA:  Elevated liver function tests. Nausea vomiting. EXAM: CT ABDOMEN W ITHOUT CONTRAST TECHNIQUE: Multidetector CT imaging of the abdomen was performed following the standard protocol without IV contrast. COMPARISON:  August 30, 2015 FINDINGS: Lower chest: Partially visualized area of scarring/atelectasis in the lingula. Atelectasis versus airspace consolidation in the dependent portion of the left lower lobe. Enlarged heart. Minimal pericardial thickening versus tiny effusion. Calcific atherosclerotic disease of the aorta and coronary arteries, mild. Hepatobiliary: Normal appearance of the liver. The gallbladder is not abnormally distended but demonstrates a hyperdense appearance. Pancreas: Fatty replaced pancreas. Spleen: Normal in size without focal abnormality. Adrenals/Urinary Tract: Mild left adrenal thickening. Normal nonenhanced appearance of the kidneys. Stomach/Bowel: Stomach is within normal limits. No evidence of bowel wall thickening, distention, or inflammatory changes. Vascular/Lymphatic: Aortic atherosclerosis. No enlarged abdominal lymph nodes. Other: No abdominal wall hernia or abnormality. Musculoskeletal: Spondylosis the lumbosacral spine. IMPRESSION: 1. Unusual hyperdense appearance of the gallbladder. If the patient has had recent administration of IV contrast, this may represent  vicarious excretion of contrast. Otherwise MRCP or ERCP may be considered for further investigation. 2. Enlarged heart. Minimal pericardial thickening versus tiny effusion. 3. Partially visualized area of scarring/atelectasis in the lingula. 4. Atelectasis versus airspace consolidation in the dependent portion of the left lower lobe. 5. Calcific atherosclerotic disease of the aorta and coronary arteries. Electronically Signed   By: Fidela Salisbury M.D.   On: 12/28/2018 14:22   US Renal  Result Date: 12/31/2018 CLINICAL DATA:  Acute renal insufficiency. EXAM: RENAL / URINARY TRACT ULTRASOUND COMPLETE COMPARISON:  CT 12/28/2018 FINDINGS: Right Kidney: Renal measurements: 10.9 x 4.1 x 4.0 cm = volume: 92 mL . Echogenicity within normal limits. No mass or hydronephrosis visualized. 4 mm indeterminate hyperechoic focus over the mid pole cortex which is not seen on recent noncontrast CT. Left Kidney: Renal measurements: 8.9 x 4.5 x 5.0 cm = volume: 104 mL. Echogenicity within normal limits. No mass or hydronephrosis visualized. Bladder: Appears normal for degree of bladder distention. IMPRESSION: Normal size kidneys without hydronephrosis. Indeterminate 4 mm hyperechoic focus over the mid pole cortex of the right kidney which is not seen on recent noncontrast CT. Recommend follow-up ultrasound 6 months. Electronically Signed   By: Marin Olp M.D.   On: 12/31/2018 16:37   Dg Chest Port 1 View  Result Date: 01/04/2019 CLINICAL DATA:  Increased shortness of breath tonight. History of CHF. EXAM: PORTABLE CHEST 1 VIEW COMPARISON:  Radiograph 01/01/2019 FINDINGS: Cardiomegaly, unchanged. Aortic atherosclerosis. Progressive peribronchial thickening suspicious for pulmonary edema. Hazy bibasilar opacities consistent with pleural effusions. No pneumothorax. IMPRESSION: Development of pulmonary edema and bilateral pleural effusions consistent with CHF. Cardiomegaly is unchanged. Electronically Signed   By: Keith Rake M.D.   On: 01/04/2019 21:53   Dg Chest Port 1 View  Result Date: 01/01/2019 CLINICAL DATA:  Hypoxemia, shortness of breath with weakness today. EXAM: PORTABLE CHEST 1 VIEW COMPARISON:  Chest x-rays dated 12/28/2018 and 10/15/2018. FINDINGS: Chronic opacity at the LEFT lung base presumed scarring/atelectasis based on recent CT abdomen of 12/28/2018. No new lung findings. No pneumothorax seen. Stable cardiomegaly. IMPRESSION: Stable chest x-ray. No evidence of acute cardiopulmonary abnormality. No evidence of pneumonia or pulmonary  edema. Electronically Signed   By: Franki Cabot M.D.   On: 01/01/2019 08:13   Dg Chest Port 1 View  Result Date: 12/28/2018 CLINICAL DATA:  Generalized weakness for 3 days. EXAM: PORTABLE CHEST 1 VIEW COMPARISON:  10/15/2018 FINDINGS: The heart is enlarged but stable. There is tortuosity and calcification of the thoracic aorta. The lungs are grossly clear. No infiltrates, edema or effusions. The bony thorax is intact. IMPRESSION: Stable cardiac enlargement but no acute pulmonary findings. Electronically Signed   By: Marijo Sanes M.D.   On: 12/28/2018 10:40   US Abdomen Limited Ruq  Result Date: 12/29/2018 CLINICAL DATA:  Abnormal transaminase level. EXAM: ULTRASOUND ABDOMEN LIMITED RIGHT UPPER QUADRANT COMPARISON:  CT scan of December 28, 2018. FINDINGS: Gallbladder: No gallstones or wall thickening visualized. No sonographic Murphy sign noted by sonographer. Common bile duct: Diameter: 4 mm which is within normal limits. Liver: No focal lesion identified. Increased echogenicity of hepatic parenchyma is noted suggesting hepatic steatosis or possibly other diffuse hepatocellular disease. Portal vein is patent on color Doppler imaging with normal direction of blood flow towards the liver. IMPRESSION: Mildly increased echogenicity of hepatic parenchyma is noted suggesting hepatic steatosis or other diffuse hepatocellular disease. No other abnormality seen in the right upper  quadrant of the abdomen. Electronically Signed   By: Marijo Conception M.D.   On: 12/29/2018 12:08    Time Spent in minutes 35   Seann Genther M.D on 01/07/2019 at 1:16 PM  Between 7am to 7pm - Pager - [phone]  After 7pm go to [email or web address] - password [facility]  [facility] -  Office  [phone]

## 2019-01-07 NOTE — Progress Notes (Signed)
Brief cardiology update note:  Reviewed family meeting with palliative care, patient, son, and primary team. Appears that decisions are in process for next steps.  She is stable from a cardiac standpoint, though her long term prognosis from a cardiac standpoint is guarded at best. Please see my extensive comments after shared call with son yesterday.  She is currently well rate controlled on metoprolol and is still on amiodarone.   [facility] will sign off.  If her condition changes, or plans for care are adjusted, please contact the cardiology team and we would be happy to reassess. Medication Recommendations:  Amiodarone 200 mg daily (son debating future cardioversion. If he decides not to pursue this, would stop amiodarone) Apixaban 5 mg BID Metoprolol succinate 50 mg daily  Other recommendations (labs, testing, etc):  none Follow up as an outpatient:  Has follow up scheduled with Dr. Percival Spanish in several weeks.

## 2019-01-07 NOTE — Progress Notes (Signed)
  Palliative medicine progress note  Patient seen, chart reviewed.  Her son, Talaya Lamprecht, is at the bedside.  Mr. Watters remains very conflicted about how to take care of his mother going forward.  She is requesting to go home but she is quite weak with multiple comorbidities specifically end-stage heart failure with an EF of 20 to 25%.  She is a 2 person full assist to get to a standing position as well as ADL care in the bed.  Patient's son has to work during the day, and his girlfriend is pregnant and cannot administer the level of care that she needs.  He is feeling  a lot of guilt particularly with going to a skilled nursing facility for rehab in the COVID-19 era.  He is worried she will feel abandoned as well as if states "they do not provide good care"  Dr. Clementeen Graham joined [place].  We both spoke to the son regarding patient's increasing care needs and the inability to provide that in the home at this point.  Dr. Clementeen Graham and I discussed if there is any potential for CIR   Patient Profile 78 y.o. female with multiple medical problems including hypertension, diabetes, arthritis, glaucoma, CAD, systolic congestive heart failure EF 20-25%, atrial fibrillation, and depression. She presented from home with son to ED with complaints of generalized weakness, poor appetite, and difficulty walking. Son reports patient is depressed as her only daughter passed away on 09-Jan-2023 due to CHF complications. Since admission patient continues to be lethargic, with hypotension, lethargy, and worsening renal failure.   Palliative Medicine team to assist with goals of care discussion.   Plan Son is very conflicted and feels very guilty about the possibility of putting his mother in a nursing home. I did place another consult into PT to evaluate for CIR otherwise, unfortunately, SNF level of care at least initially to be pursued Patient's son has to work all day, and his girlfriend is pregnant, and patient is a full 2 person  assist.  She is very weak, and unable to lie flat because of shortness of breath.  I do feel like she would qualify for her hospice benefit in the home but that is not patient's goal nor does that eliminate the problem of needing 24/7 caregivers in the house  If she does go to a skilled nursing facility, I highly recommend outpatient palliative care services to keep her connected with some options.  This can be accessed through either hospice and palliative care of [facility]'s palliative medicine division at [phone] or care connection at [phone].  Please place those resources on discharge summary if patient goes to SNF  Prognosis Patient would qualify for her in-home/facility hospice benefit.  Thus far that has not been her goal.  Her goal remains full scope of treatment with a desire to go home however she is gotten so weak, short of breath now with her end-stage heart failure, EF 20 to 25% with associated chronic kidney disease stage III-IV  Disposition CIR versus SNF  Thank you, Romona Curls, NP Total time 35 minutes Greater than 50% of time was spent in counseling and coordination of care

## 2019-01-07 NOTE — Progress Notes (Signed)
PT Note  Asked by palliative care if pt would be appropriate for CIR stay. I do not think patient can tolerate the 3 hours of therapy on CIR at this time. Pt fatigues with activity and requires extended recovery time just getting from bed to chair. Also believe pt would still need 24 hour assist after a CIR stay. Continue to recommend SNF unless pt has extensive 24 hour assist at home.   [place] Pager [phone] Office [phone]

## 2019-01-08 DIAGNOSIS — F4323 Adjustment disorder with mixed anxiety and depressed mood: Secondary | ICD-10-CM | POA: Diagnosis present

## 2019-01-08 DIAGNOSIS — I472 Ventricular tachycardia: Secondary | ICD-10-CM

## 2019-01-08 DIAGNOSIS — I493 Ventricular premature depolarization: Secondary | ICD-10-CM

## 2019-01-08 LAB — BASIC METABOLIC PANEL
Anion gap: 11 (ref 5–15)
BUN: 30 mg/dL — ABNORMAL HIGH (ref 8–23)
CO2: 31 mmol/L (ref 22–32)
Calcium: 8.1 mg/dL — ABNORMAL LOW (ref 8.9–10.3)
Chloride: 94 mmol/L — ABNORMAL LOW (ref 98–111)
Creatinine, Ser: 1.5 mg/dL — ABNORMAL HIGH (ref 0.44–1.00)
GFR calc Af Amer: 38 mL/min — ABNORMAL LOW (ref >60)
GFR calc non Af Amer: 33 mL/min — ABNORMAL LOW (ref >60)
Glucose, Bld: 136 mg/dL — ABNORMAL HIGH (ref 70–99)
Potassium: 3.1 mmol/L — ABNORMAL LOW (ref 3.5–5.1)
Sodium: 136 mmol/L (ref 135–145)

## 2019-01-08 LAB — GLUCOSE, CAPILLARY
Glucose-Capillary: 80 mg/dL (ref 70–99)
Glucose-Capillary: 136 mg/dL — ABNORMAL HIGH (ref 70–99)
Glucose-Capillary: 128 mg/dL — ABNORMAL HIGH (ref 70–99)
Glucose-Capillary: 91 mg/dL (ref 70–99)

## 2019-01-08 LAB — MAGNESIUM: Magnesium: 1.4 mg/dL — ABNORMAL LOW (ref 1.7–2.4)

## 2019-01-08 MED ORDER — CLONAZEPAM 0.25 MG PO TBDP
0.2500 mg | ORAL_TABLET | Freq: Two times a day (BID) | ORAL | Status: DC | PRN
  Administered 2019-01-08 – 2019-01-09 (×2): 0.25 mg via ORAL
  Filled 2019-01-08 (×2): qty 1

## 2019-01-08 MED ORDER — BUSPIRONE HCL 15 MG PO TABS
7.5000 mg | ORAL_TABLET | Freq: Two times a day (BID) | ORAL | Status: DC
  Administered 2019-01-08 – 2019-01-13 (×10): 7.5 mg via ORAL
  Filled 2019-01-08 (×11): qty 1

## 2019-01-08 NOTE — NC FL2 (Signed)
[place] LEVEL OF CARE SCREENING TOOL     IDENTIFICATION  Patient Name: Teresa [name] Birthdate: 12-Nov-1940 Sex: female Admission Date (Current Location): 12/28/2018  [facility] and [place] Number:  Herbalist and Address:  [facility] [place] [address], [place], [place] [address]      Provider Number: [id number]  Attending Physician Name and Address:  Louellen Molder, MD  Relative Name and Phone Number:  Zenya, [place], [phone]    Current Level of Care: Hospital Recommended Level of Care: [place] Prior Approval Number:    Date Approved/Denied:   PASRR Number: Under Manual Review  Discharge Plan: SNF    Current Diagnoses: Patient Active Problem List   Diagnosis Date Noted  . Adjustment disorder with mixed anxiety and depressed mood 01/08/2019  . Goals of care, counseling/discussion   . Sepsis ([place]) 12/28/2018  . Abnormal transaminases 10/15/2018  . Thrombocytopenia ([place]) 10/15/2018  . Atrial fibrillation ([place]) 10/13/2018  . Acute on chronic congestive heart failure ([place])   . AKI (acute kidney injury) ([place])   . Hypomagnesemia   . Pressure injury of skin 08/12/2018  . Acute renal failure (ARF) ([place]) 08/10/2018  . Hypotension due to hypovolemia 08/10/2018  . Hypokalemia 08/10/2018  . Hyponatremia 08/10/2018  . Metabolic acidosis, increased anion gap 08/10/2018  . Hypocalcemia 08/10/2018  . Elevated troponin 08/10/2018  . Prolonged QT interval 08/10/2018  . Atrial fibrillation with RVR ([place]) 08/10/2018  . Pancreatic pseudocyst 11/09/2017  . Polymyalgia rheumatica ([place]) 10/12/2016  . Anxiety and depression 10/12/2016  . Bilateral hip pain 06/24/2016  . Chronic pain of both shoulders 06/24/2016  . Psychophysiological insomnia 05/12/2016  . Spinal stenosis   . History of glaucoma   . History of small bowel obstruction   . Peptic ulcer disease   . Chronic systolic CHF (congestive heart failure),  NYHA class 2 ([place])   . Moderate mitral regurgitation   . Moderate tricuspid regurgitation   . H/O hiatal hernia   . Urge urinary incontinence   . Left ovarian cyst   . Diabetes mellitus type 2, diet-controlled ([place])   . Shortness of breath   . Wears dentures   . Hypothyroidism   . Generalized anxiety disorder 09/15/2013  . Hernia, incisional 05/10/2012  . Abdominal wall hernia 02/10/2012  . Dyslipidemia 05/01/2011  . GERD (gastroesophageal reflux disease) 05/01/2011  . Osteoarthritis 05/01/2011  . Chronic renal insufficiency 05/01/2011  . HLD (hyperlipidemia) 05/01/2011  . SBO (small bowel obstruction), s/p expl lap 04/24/2011  . Nonischemic cardiomyopathy ([place])   . Arthritis of ankle or foot, degenerative 07/12/2010  . Arthritis of hand, degenerative 07/12/2010  . Malaise and fatigue 07/12/2010  . Essential (primary) hypertension 07/12/2010  . Glaucoma 07/12/2010    Orientation RESPIRATION BLADDER Height & Weight     Self, Time, Situation, Place  O2(100, [place], 3.5L) Incontinent, External catheter Weight: 223 lb 12.3 oz (101.5 kg) Height:  5\' 3"  (160 cm)  BEHAVIORAL SYMPTOMS/MOOD NEUROLOGICAL BOWEL NUTRITION STATUS      Incontinent Diet(Dysphasia 3 diet, thin liquids, fluid restriciton 1543ml, needs assistance)  AMBULATORY STATUS COMMUNICATION OF NEEDS Skin   Limited Assist Verbally Bruising(Bruising on arm, MASD on buttocks)                       Personal Care Assistance Level of Assistance  Bathing, Feeding, Dressing, Total care Bathing Assistance: Limited assistance Feeding assistance: Independent Dressing Assistance: Limited assistance Total Care Assistance:  Limited assistance   Functional Limitations Info  Sight, Hearing, Speech Sight Info: Impaired Hearing Info: Impaired Speech Info: Adequate    SPECIAL CARE FACTORS FREQUENCY  PT (By licensed PT), OT (By licensed OT)     PT Frequency: 5x/wk OT Frequency: 5x/wk            Contractures Contractures  Info: Not present    Additional Factors Info  Code Status, Allergies, Insulin Sliding Scale, Psychotropic Code Status Info: Full Code Allergies Info: Ambien (Zolpidem Tartrate), Aspirin, Nsaids, Tolmetin Psychotropic Info: Celexa tablet 20mg  daily Insulin Sliding Scale Info: insulin aspart novolog 0-9 units 3x daily w/meals       Current Medications (01/08/2019):  This is the current hospital active medication list Current Facility-Administered Medications  Medication Dose Route Frequency Provider Last Rate Last Dose  . allopurinol (ZYLOPRIM) tablet 100 mg  100 mg Oral Daily Regalado, Belkys A, MD   100 mg at 01/08/19 0933  . amiodarone (PACERONE) tablet 200 mg  200 mg Oral Daily Lelon Perla, MD   200 mg at 01/08/19 0933  . apixaban (ELIQUIS) tablet 5 mg  5 mg Oral BID Hammons, Kimberly B, RPH   5 mg at 01/08/19 0935  . busPIRone (BUSPAR) tablet 7.5 mg  7.5 mg Oral BID Akintayo, Mojeed, MD      . citalopram (CELEXA) tablet 20 mg  20 mg Oral Daily Bonnell Public Tublu, MD   20 mg at 01/08/19 0934  . clonazePAM (KLONOPIN) disintegrating tablet 0.25 mg  0.25 mg Oral BID PRN Akintayo, Mojeed, MD      . colchicine tablet 0.6 mg  0.6 mg Oral Daily Regalado, Belkys A, MD   0.6 mg at 01/08/19 0934  . feeding supplement (BOOST / RESOURCE BREEZE) liquid 1 Container  1 Container Oral TID BM Regalado, Belkys A, MD   1 Container at 01/08/19 1311  . Gerhardt's butt cream   Topical TID Regalado, Belkys A, MD      . HYDROcodone-acetaminophen (NORCO/VICODIN) 5-325 MG per tablet 1 tablet  1 tablet Oral Q6H PRN Regalado, Belkys A, MD   1 tablet at 01/07/19 2227  . insulin aspart (novoLOG) injection 0-9 Units  0-9 Units Subcutaneous TID WC Kirby-Graham, Karsten Fells, NP   1 Units at 01/08/19 1242  . levothyroxine (SYNTHROID) tablet 100 mcg  100 mcg Oral Q0600 Vashti Hey, MD   100 mcg at 01/08/19 0240  . lip balm (BLISTEX) ointment   Topical PRN Arrien, Jimmy Picket, MD      . magnesium  oxide (MAG-OX) tablet 200 mg  200 mg Oral BID Regalado, Belkys A, MD   200 mg at 01/08/19 0932  . metoprolol succinate (TOPROL-XL) 24 hr tablet 50 mg  50 mg Oral Daily Buford Dresser, MD   50 mg at 01/08/19 0934  . metoprolol tartrate (LOPRESSOR) injection 5 mg  5 mg Intravenous Q5 min PRN Lovey Newcomer T, NP   5 mg at 01/02/19 0517  . polyethylene glycol (MIRALAX / GLYCOLAX) packet 17 g  17 g Oral Daily PRN Bonnell Public Tublu, MD      . sodium chloride flush (NS) 0.9 % injection 10-40 mL  10-40 mL Intracatheter PRN Regalado, Belkys A, MD      . torsemide (DEMADEX) tablet 40 mg  40 mg Oral Daily Buford Dresser, MD   40 mg at 01/08/19 9735     Discharge Medications: Please see discharge summary for a list of discharge medications.  Relevant Imaging Results:  Relevant Lab  Results:   Additional Information SSN: [id number]  Philippa Chester Kathyleen Radice, LCSWA

## 2019-01-08 NOTE — TOC Initial Note (Signed)
Transition of Care [facility]) - Initial/Assessment Note    Patient Details  Name: Teresa [name] MRN: [id number] Date of Birth: 02-20-1941  [facility] CM/SW Contact:    Gelene Mink, [place] Phone Number: 01/08/2019, 4:26 PM  Clinical Narrative:                  CSW called and spoke with the patients son, Teresa [name]. Teresa [name] stated now he is agreeable to SNF. He was reluctant because he felt that his mother would decline but he stated that she would not be able to have 24/7 support at home. CSW obtained permission to complete the patient's Fl2. Teresa [name] wanted to look up the facilities before giving SNF choices. CSW stated that she would follow up with him on Sunday to see what facilities he would like to send his mother too. CSW emailed SNF choice to [place].   CSW will continue to follow.    Expected Discharge Plan: Skilled Nursing Facility Barriers to Discharge: Continued Medical Work up, SNF Pending bed offer   Patient Goals and CMS Choice Patient states their goals for this hospitalization and ongoing recovery are:: Pt son is now agreeable to rehab CMS Medicare.gov Compare Post Acute Care list provided to:: Patient Represenative (must comment) Choice offered to / list presented to : Adult Children  Expected Discharge Plan and Services Expected Discharge Plan: [place] In-house Referral: Clinical Social Work Discharge Planning Services: CM Consult Post Acute Care Choice: [place] arrangements for the past 2 months: Single Family Home                 DME Arranged: N/A DME Agency: NA       HH Arranged: NA HH Agency: NA Date HH Agency Contacted: 12/29/18 Time [place]: 1134 Representative spoke with at [place]: Oswald Hillock  Prior Living Arrangements/Services Living arrangements for the past 2 months: Single Family Home Lives with:: Self Patient language and need for interpreter reviewed:: No Do you feel safe  going back to the place where you live?: Yes      Need for Family Participation in Patient Care: Yes (Comment) Care giver support system in place?: Yes (comment) Current home services: Other (comment)(NA) Criminal Activity/Legal Involvement Pertinent to Current Situation/Hospitalization: No - Comment as needed  Activities of Daily Living Home Assistive Devices/Equipment: Environmental consultant (specify type), Eyeglasses, Bedside commode/3-in-1, Wheelchair ADL Screening (condition at time of admission) Patient's cognitive ability adequate to safely complete daily activities?: No Is the patient deaf or have difficulty hearing?: Yes Does the patient have difficulty seeing, even when wearing glasses/contacts?: No Does the patient have difficulty concentrating, remembering, or making decisions?: Yes Patient able to express need for assistance with ADLs?: Yes Does the patient have difficulty dressing or bathing?: Yes Independently performs ADLs?: No Communication: Independent Dressing (OT): Needs assistance Is this a change from baseline?: Change from baseline, expected to last >3 days Grooming: Needs assistance Is this a change from baseline?: Change from baseline, expected to last >3 days Feeding: Needs assistance Is this a change from baseline?: Change from baseline, expected to last >3 days Bathing: Needs assistance Is this a change from baseline?: Change from baseline, expected to last >3 days Toileting: Needs assistance Is this a change from baseline?: Change from baseline, expected to last >3days In/Out Bed: Needs assistance Is this a change from baseline?: Change from baseline, expected to last >3 days Walks in Home: Needs assistance Is this a change from baseline?:  Change from baseline, expected to last >3 days Does the patient have difficulty walking or climbing stairs?: Yes Weakness of Legs: Both Weakness of Arms/Hands: Both  Permission Sought/Granted Permission sought to share information  with : Case Manager Permission granted to share information with : Yes, Verbal Permission Granted  Share Information with NAME: Teresa [name]  Permission granted to share info w AGENCY: All SNF  Permission granted to share info w Relationship: Son  Permission granted to share info w Contact Information: [phone]  Emotional Assessment Appearance:: Appears stated age Attitude/Demeanor/Rapport: Unable to Assess Affect (typically observed): Unable to Assess Orientation: : Oriented to Self, Oriented to Place, Oriented to  Time, Oriented to Situation Alcohol / Substance Use: Not Applicable Psych Involvement: No (comment)  Admission diagnosis:  Hyperkalemia [E87.5] Abnormal EKG [R94.31] Acute renal failure, unspecified acute renal failure type [facility]) [N17.9] Patient Active Problem List   Diagnosis Date Noted  . Adjustment disorder with mixed anxiety and depressed mood 01/08/2019  . Goals of care, counseling/discussion   . Sepsis ([place]) 12/28/2018  . Abnormal transaminases 10/15/2018  . Thrombocytopenia ([place]) 10/15/2018  . Atrial fibrillation ([place]) 10/13/2018  . Acute on chronic congestive heart failure ([place])   . AKI (acute kidney injury) ([place])   . Hypomagnesemia   . Pressure injury of skin 08/12/2018  . Acute renal failure (ARF) ([place]) 08/10/2018  . Hypotension due to hypovolemia 08/10/2018  . Hypokalemia 08/10/2018  . Hyponatremia 08/10/2018  . Metabolic acidosis, increased anion gap 08/10/2018  . Hypocalcemia 08/10/2018  . Elevated troponin 08/10/2018  . Prolonged QT interval 08/10/2018  . Atrial fibrillation with RVR ([place]) 08/10/2018  . Pancreatic pseudocyst 11/09/2017  . Polymyalgia rheumatica ([place]) 10/12/2016  . Anxiety and depression 10/12/2016  . Bilateral hip pain 06/24/2016  . Chronic pain of both shoulders 06/24/2016  . Psychophysiological insomnia 05/12/2016  . Spinal stenosis   . History of glaucoma   . History of small bowel obstruction   . Peptic ulcer disease   .  Chronic systolic CHF (congestive heart failure), NYHA class 2 ([place])   . Moderate mitral regurgitation   . Moderate tricuspid regurgitation   . H/O hiatal hernia   . Urge urinary incontinence   . Left ovarian cyst   . Diabetes mellitus type 2, diet-controlled ([place])   . Shortness of breath   . Wears dentures   . Hypothyroidism   . Generalized anxiety disorder 09/15/2013  . Hernia, incisional 05/10/2012  . Abdominal wall hernia 02/10/2012  . Dyslipidemia 05/01/2011  . GERD (gastroesophageal reflux disease) 05/01/2011  . Osteoarthritis 05/01/2011  . Chronic renal insufficiency 05/01/2011  . HLD (hyperlipidemia) 05/01/2011  . SBO (small bowel obstruction), s/p expl lap 04/24/2011  . Nonischemic cardiomyopathy ([place])   . Arthritis of ankle or foot, degenerative 07/12/2010  . Arthritis of hand, degenerative 07/12/2010  . Malaise and fatigue 07/12/2010  . Essential (primary) hypertension 07/12/2010  . Glaucoma 07/12/2010   PCP:  [name], MD Pharmacy:   [facility] [place] [address] Phone: [phone] Fax: [phone]     Social Determinants of Health (SDOH) Interventions    Readmission Risk Interventions Readmission Risk Prevention Plan 10/18/2018 10/15/2018  Transportation Screening - Complete  HRI or [place] Agency - Complete  Social Work Consult for [place] Planning/Counseling Complete -  Palliative Care Screening - Not Applicable  Medication Review Press photographer) - Complete  Some recent data might be hidden

## 2019-01-08 NOTE — Progress Notes (Signed)
Patient son called and inquired how she was doing. I informed that patient is very restless and anxious and not able to calm herself. Informed that night medications were given. Patient spoke with son on my phone. Will continue to monitor. Arthor Captain LPN

## 2019-01-08 NOTE — Progress Notes (Signed)
01/08/2019 Central monitor called at 0830 patient had 7 beat run of v tach. Rn assess patient and she was asymptomatic. Dr Clementeen Graham was made aware at 0930. [facility] RN.

## 2019-01-08 NOTE — Consult Note (Signed)
Telepsych Consultation   Reason for Consult:  ''depression-not getting better'' Referring Physician:  Dr. Clementeen Graham Location of Patient: [facility] Location of Provider: [facility]  Patient Identification: Teresa [name] MRN:  [id number] Principal Diagnosis: Hypotension due to hypovolemia Diagnosis:  Principal Problem:   Hypotension due to hypovolemia Active Problems:   Nonischemic cardiomyopathy ([place])   Malaise and fatigue   Essential (primary) hypertension   Chronic systolic CHF (congestive heart failure), NYHA class 2 (HCC)   Moderate mitral regurgitation   Diabetes mellitus type 2, diet-controlled ([place])   Hypothyroidism   Anxiety and depression   Acute renal failure (ARF) (HCC)   Atrial fibrillation (HCC)   Abnormal transaminases   Goals of care, counseling/discussion   Adjustment disorder with mixed anxiety and depressed mood   Total Time spent with patient: 45 minutes  Subjective:   Teresa [name] is a 78 y.o. female patient admitted due to generalized weakness.  HPI: 78 year old female with history of hypertension, diabetes mellitus, coronary artery disease with chronic systolic CHF (EF of 07-62%), A. fib on anticoagulation, anxiety and depression who was admitted to the hospital due to generalized body weakness. Patient is a poor historian but she gave permission to her son who her care giver to participate in the psychiatric interview. Patient's son reports that patient was apparently stable until July 4th, 2020 when her 56 year old daughter died from complication of heart failure. After that day, son reports that his mother stopped eating, drinking, withdrawn and was brought to the hospital lethargic. Son reports that his mother was feeling depressed, hopeless with low energy level but never verbalize suicidal ideation, intent or plan. Today, patient continues to report excessive worries, anxiety, nervousness, apprehension, low energy level but felling less depressed.  She denies psychosis, delusions, suicidal ideation, intent or plan.  Past Psychiatric History: Depression and Anxiety  Risk to Self:  denies Risk to Others:  denies Prior Inpatient Therapy:  none Prior Outpatient Therapy:   PCP Past Medical History:  Past Medical History:  Diagnosis Date  . Abdominal pain   . Anxiety    maintained on Xanax tid for years.  . Arthritis    FEET, KNEES, HANDS  . Atrial fibrillation ([place]) 09/2018  . Chronic systolic CHF (congestive heart failure), NYHA class 2 ([place])    EF 25% 2011 MV,  50-55% echo 6/13  . Diabetes mellitus type 2, diet-controlled ([place])   . GERD (gastroesophageal reflux disease)   . Glaucoma    s/p surgery B in 30s.  [place].  . H/O hiatal hernia   . History of glaucoma   . History of small bowel obstruction 2009 AND NOV 2012  . HTN (hypertension)   . Hypothyroidism   . Left ovarian cyst   . Moderate mitral regurgitation   . Moderate tricuspid regurgitation   . Non-healing surgical wound    ABDOMINAL S/P EXPLORATOY LAPAROTOMY 04-18-2011  . Nonischemic cardiomyopathy ([place]) DR [facility]    EF is 25% per echo February 2012, non ischemic myoview 12/2009.  EF 50% echo 7/12 and plans for ICD cancelled.   . Peptic ulcer disease   . PVC (premature ventricular contraction)   . Spinal stenosis   . Urge urinary incontinence   . Valvular heart disease    Moderate to severe MR, moderate TR, LAE per echo 7/11  . Wears dentures    upper denture-lower partial    Past Surgical History:  Procedure Laterality Date  . ABDOMINAL HERNIA REPAIR  32 YRS AGO  PERITINITIS  . APPENDECTOMY  1979   RUPTURED  . BENIGN RIGHT BREAST TUMOR REMOVED    . CARDIOVASCULAR STRESS TEST  JULY 2011-  DR HOCHREIN   NO ISCHEMIA  . CARDIOVERSION N/A 08/17/2018   Procedure: CARDIOVERSION;  Surgeon: Sueanne Margarita, MD;  Location: [facility] ENDOSCOPY;  Service: Cardiovascular;  Laterality: N/A;  . EXPLORATOMY LAP. / EXTENSIVE LYSIS ADHESIONS/ SMALL BOWEL RESECTION X2 WITH  PRIMARY ANASTOMOSIS X2  04-18-2011   SMALL BOWEL OBSTRUCTION  . GLAUCOMA SURGERY  1978  . HERNIA REPAIR    . INCISION AND DRAINAGE OF WOUND N/A 09/13/2012   Procedure: INCISION  AND DEBRIDEMENT WOUND abdominal wall ;  Surgeon: Rolm Bookbinder, MD;  Location: [place];  Service: General;  Laterality: N/A;  coordination with Dr Migdalia Dk   . KNEE ARTHROSCOPY W/ MENISCECTOMY  06-05-2004  . TEE WITHOUT CARDIOVERSION N/A 08/17/2018   Procedure: TRANSESOPHAGEAL ECHOCARDIOGRAM (TEE);  Surgeon: Sueanne Margarita, MD;  Location: [facility] ENDOSCOPY;  Service: Cardiovascular;  Laterality: N/A;  . THYROIDECTOMY  1978 GOITOR   AND CERVICAL COLD KNIFE CONE BX  . TONSILLECTOMY  AGE 75  . TRANSTHORACIC ECHOCARDIOGRAM  12-27-2010   EF 45-50%/ MODERATE MV REGURG. / LEFT ATRIUM MILDLY DILATED/ MILD TRICUSPID REGURG.  . TUBAL LIGATION    . VENTRAL HERNIA REPAIR  X4  LAST ONE 20 YRS AGO   ABDOMINAL --  EACH ONE IN DIFFERENT AREA  . VENTRAL HERNIA REPAIR  01/29/2012   Procedure: HERNIA REPAIR VENTRAL ADULT;  Surgeon: Theodoro Kos, DO;  Location: [place];  Service: Plastics;  Laterality: N/A;  . WOUND DEBRIDEMENT  09/25/2011   Procedure: DEBRIDEMENT CLOSURE/ABDOMINAL WOUND;  Surgeon: Theodoro Kos, DO;  Location: [place];  Service: Plastics;  Laterality: N/A;  excision of abdominal wound with primary closure   Family History:  Family History  Problem Relation Age of Onset  . Stroke Brother   . Diabetes Brother   . Tuberculosis Father   . Stroke Mother   . Stroke Sister   . Heart disease Sister   . Emphysema Sister   . Diabetes Daughter    Family Psychiatric  History:  Social History:  Social History   Substance and Sexual Activity  Alcohol Use No     Social History   Substance and Sexual Activity  Drug Use No    Social History   Socioeconomic History  . Marital status: Widowed    Spouse name: Not on file  . Number of children: 2  . Years of education: Not on file  .  Highest education level: Not on file  Occupational History  . Occupation: RETIRED  Social Needs  . Financial resource strain: Not on file  . Food insecurity    Worry: Not on file    Inability: Not on file  . Transportation needs    Medical: Not on file    Non-medical: Not on file  Tobacco Use  . Smoking status: Former Smoker    Packs/day: 1.50    Years: 45.00    Pack years: 67.50    Types: Cigarettes    Start date: 11/14/1964    Quit date: 09/22/2010    Years since quitting: 8.3  . Smokeless tobacco: Never Used  . Tobacco comment: quit 2011 or 2012 - Didn't smoke during pregnancies  Substance and Sexual Activity  . Alcohol use: No  . Drug use: No  . Sexual activity: Not Currently  Lifestyle  . Physical activity    Days  per week: Not on file    Minutes per session: Not on file  . Stress: Not on file  Relationships  . Social Herbalist on phone: Not on file    Gets together: Not on file    Attends religious service: Not on file    Active member of club or organization: Not on file    Attends meetings of clubs or organizations: Not on file    Relationship status: Not on file  Other Topics Concern  . Not on file  Social History Narrative   Marital status:  Widowed as of Sep 23, 2010; not dating.        Children: four children (1 daughter in [place], 1 daughter in [place] estranged; 1 son in [place]; 1 son deceased in 09-23-2014); six grandchildren; 5 gg.      Lives: alone with a yorkie.  Son/Harry lives close.        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      Tobacco:  None; quit in Sep 23, 2010.        Alcohol: none      Drugs:none      Exercise:  No formal exercise program.  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  Cannot swim.        ADLs:  Driving; cleans own house; does grocery shopping; pays bills; does not use cane for ambulation.  Drives.        Advanced Directives: no living will; no HCPOA.  DNR/DNI.        National Pulmonary:    Originally from [facility]. Has always lived in [place]. She moved to [place], [place]. 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 Has a dog currently. No bird or mold exposure.    Additional Social History:    Allergies:   Allergies  Allergen Reactions  . Ambien [Zolpidem Tartrate] Other (See Comments)      Severe lethargy confusion and restlessness  . Aspirin Other (See Comments)    HX Bleeding Ulcer   . Nsaids Other (See Comments)    Hx of bleeding ulcers  . Tolmetin Rash    Hx of bleeding ulcers    Labs:  Results for orders placed or performed during the hospital encounter of 12/28/18 (from the past 48 hour(s))  Glucose, capillary     Status: Abnormal   Collection Time: 01/06/19  4:03 PM  Result Value Ref Range   Glucose-Capillary 144 (H) 70 - 99 mg/dL  Hepatic function panel     Status: Abnormal   Collection Time: 01/06/19  4:55 PM  Result Value Ref Range   Total Protein 5.8 (L) 6.5 - 8.1 g/dL   Albumin 2.7 (L) 3.5 - 5.0 g/dL   AST 51 (H) 15 - 41 U/L   ALT 40 0 - 44 U/L   Alkaline Phosphatase 168 (H) 38 - 126 U/L   Total Bilirubin 1.6 (H) 0.3 - 1.2 mg/dL   Bilirubin, Direct 0.5 (H) 0.0 - 0.2 mg/dL   Indirect Bilirubin 1.1 (H) 0.3 - 0.9 mg/dL    Comment: Performed at [facility], [address] [place], [place] [address]  Glucose, capillary     Status: Abnormal   Collection Time: 01/06/19  9:24 PM  Result Value Ref Range   Glucose-Capillary 140 (H) 70 - 99 mg/dL  Basic metabolic panel     Status: Abnormal   Collection Time: 01/07/19  2:38 AM  Result Value Ref  Range   Sodium 136 135 - 145 mmol/L   Potassium 3.4 (L) 3.5 - 5.1 mmol/L    Comment: DELTA CHECK NOTED   Chloride 96 (L) 98 - 111 mmol/L   CO2 30 22 - 32 mmol/L   Glucose, Bld 87 70 - 99 mg/dL   BUN 36 (H) 8 - 23 mg/dL   Creatinine, Ser 1.67 (H) 0.44 - 1.00 mg/dL   Calcium 7.7 (L) 8.9 - 10.3 mg/dL   GFR calc non Af Amer 29 (L) >60 mL/min   GFR calc Af Amer 34 (L) >60 mL/min    Anion gap 10 5 - 15    Comment: Performed at [facility] [address], [place], [place] [address]  Glucose, capillary     Status: Abnormal   Collection Time: 01/07/19  7:22 AM  Result Value Ref Range   Glucose-Capillary 61 (L) 70 - 99 mg/dL  Glucose, capillary     Status: None   Collection Time: 01/07/19  8:51 AM  Result Value Ref Range   Glucose-Capillary 72 70 - 99 mg/dL  Glucose, capillary     Status: Abnormal   Collection Time: 01/07/19 12:22 PM  Result Value Ref Range   Glucose-Capillary 107 (H) 70 - 99 mg/dL  Glucose, capillary     Status: None   Collection Time: 01/07/19  3:38 PM  Result Value Ref Range   Glucose-Capillary 92 70 - 99 mg/dL  Glucose, capillary     Status: Abnormal   Collection Time: 01/07/19  9:13 PM  Result Value Ref Range   Glucose-Capillary 122 (H) 70 - 99 mg/dL  Glucose, capillary     Status: None   Collection Time: 01/08/19  8:15 AM  Result Value Ref Range   Glucose-Capillary 80 70 - 99 mg/dL  Glucose, capillary     Status: Abnormal   Collection Time: 01/08/19 11:34 AM  Result Value Ref Range   Glucose-Capillary 136 (H) 70 - 99 mg/dL    Medications:  Current Facility-Administered Medications  Medication Dose Route Frequency Provider Last Rate Last Dose  . allopurinol (ZYLOPRIM) tablet 100 mg  100 mg Oral Daily Regalado, Belkys A, MD   100 mg at 01/08/19 0933  . amiodarone (PACERONE) tablet 200 mg  200 mg Oral Daily Lelon Perla, MD   200 mg at 01/08/19 0933  . apixaban (ELIQUIS) tablet 5 mg  5 mg Oral BID Hammons, Kimberly B, RPH   5 mg at 01/08/19 0935  . busPIRone (BUSPAR) tablet 7.5 mg  7.5 mg Oral BID Marvelous Bouwens, MD      . citalopram (CELEXA) tablet 20 mg  20 mg Oral Daily Bonnell Public Tublu, MD   20 mg at 01/08/19 0934  . clonazePAM (KLONOPIN) disintegrating tablet 0.25 mg  0.25 mg Oral BID PRN Flynn Gwyn, MD      . colchicine tablet 0.6 mg  0.6 mg Oral Daily Regalado, Belkys A, MD   0.6 mg at 01/08/19 0934   . feeding supplement (BOOST / RESOURCE BREEZE) liquid 1 Container  1 Container Oral TID BM Regalado, Belkys A, MD      . Gerhardt's butt cream   Topical TID Regalado, Belkys A, MD   1 application at 93/81/82 2144  . HYDROcodone-acetaminophen (NORCO/VICODIN) 5-325 MG per tablet 1 tablet  1 tablet Oral Q6H PRN Regalado, Belkys A, MD   1 tablet at 01/07/19 2227  . insulin aspart (novoLOG) injection 0-9 Units  0-9 Units Subcutaneous TID WC Kirby-Graham, Karsten Fells,  NP   1 Units at 01/08/19 1242  . levothyroxine (SYNTHROID) tablet 100 mcg  100 mcg Oral Q0600 Vashti Hey, MD   100 mcg at 01/08/19 9357  . lip balm (BLISTEX) ointment   Topical PRN Arrien, Jimmy Picket, MD      . magnesium oxide (MAG-OX) tablet 200 mg  200 mg Oral BID Regalado, Belkys A, MD   200 mg at 01/08/19 0932  . metoprolol succinate (TOPROL-XL) 24 hr tablet 50 mg  50 mg Oral Daily Buford Dresser, MD   50 mg at 01/08/19 0934  . metoprolol tartrate (LOPRESSOR) injection 5 mg  5 mg Intravenous Q5 min PRN Lovey Newcomer T, NP   5 mg at 01/02/19 0517  . polyethylene glycol (MIRALAX / GLYCOLAX) packet 17 g  17 g Oral Daily PRN Bonnell Public Tublu, MD      . sodium chloride flush (NS) 0.9 % injection 10-40 mL  10-40 mL Intracatheter PRN Regalado, Belkys A, MD      . torsemide (DEMADEX) tablet 40 mg  40 mg Oral Daily Buford Dresser, MD   40 mg at 01/08/19 0177    Musculoskeletal: Strength & Muscle Tone: not applicable with tele psych Gait & Station: not applicable with tele psych Patient leans: N/A  Psychiatric Specialty Exam: Physical Exam  Psychiatric: Judgment and thought content normal. Her mood appears anxious. Her affect is blunt. Her speech is delayed. She is slowed and withdrawn. Cognition and memory are normal.    Review of Systems  Constitutional: Positive for malaise/fatigue.  HENT: Negative.   Eyes: Negative.   Skin: Negative.   Endo/Heme/Allergies: Negative.    Psychiatric/Behavioral: Positive for depression. The patient is nervous/anxious.     Blood pressure 116/60, pulse 87, temperature 98.2 F (36.8 C), temperature source Oral, resp. rate 18, height 5\' 3"  (1.6 m), weight 101.5 kg, SpO2 100 %.Body mass index is 39.64 kg/m.  General Appearance: Casual  Eye Contact:  Minimal  Speech:  Slow  Volume:  Decreased  Mood:  Dysphoric  Affect:  anxious  Thought Process:  Coherent  Orientation:  Full (Time, Place, and Person)  Thought Content:  Logical  Suicidal Thoughts:  No  Homicidal Thoughts:  No  Memory:  Immediate;   Fair Recent;   Fair Remote;   Fair  Judgement:  Intact  Insight:  Fair  Psychomotor Activity:  Psychomotor Retardation  Concentration:  Concentration: Fair and Attention Span: Fair  Recall:  [employer] of Knowledge:  Fair  Language:  Fair  Akathisia:  No  Handed:  Right  AIMS (if indicated):     Assets:  Communication Skills Desire for Improvement Social Support  ADL's:  marginal  Cognition:  WNL  fair     Treatment Plan and Summary 78 year-old female with history of hypertension, diabetes mellitus, coronary artery disease with chronic systolic CHF (EF of 93-90%), A. fib on anticoagulation, anxiety and depression who was admitted to the hospital due to generalized body weakness. Patient reportedly to have developed depression and anxiety after her daughter died on December 25, 2022. Patient currently reports improving depressive symptoms but worsening anxiety. Her son who is her care giver is on board with medication adjustment to address anxiety and depression.  Recommendations: -Discontinue Remeron due to potential for causing excessive weight gain and peripheral edema -Continue Citalopram 20 mg daily for depression/anxiety. -Increase Buspar to 7.5 mg bid for anxiety and may increase to 10 mg bid if there is no significant improvement in one week  Disposition: No evidence of imminent risk to self or others at present.    Patient does not meet criteria for psychiatric inpatient admission. Supportive therapy provided about ongoing stressors. Psychiatric service signing out. Re-consult as needed  This service was provided via telemedicine using a 2-way, interactive audio and video technology.  Names of all persons participating in this telemedicine service and their role in this encounter. Name: Teresa [name] Role: patient  Name: Lorenz Coaster Role: son-care giver  Name: Corena Pilgrim, MD Role: psychiatrist    Corena Pilgrim, MD 01/08/2019 12:46 PM

## 2019-01-08 NOTE — Progress Notes (Signed)
PROGRESS NOTE                                                                                                                                                                                                             Patient Demographics:    Teresa [name], is a 78 y.o. female, DOB - [birth year], UUV:[id number]  Admit date - 12/28/2018   Admitting Physician Vashti Hey, MD  Outpatient Primary MD for the patient is [name], MD  LOS - 11  Outpatient Specialists: cardiology  No chief complaint on file.      Brief Narrative 78 year old female with history of hypertension, diabetes mellitus, coronary artery disease with chronic systolic CHF (EF of 47-42%), A. fib on anticoagulation and depression presented to the ED with increasing weakness.  Patient has started eating and drinking poorly since 10 days prior to admission after her daughter died from CHF.  Patient presented with hyperglycemia, hypertension, acute on chronic kidney injury and lactic acidosis. On the day following admission she was found to be increasingly lethargic with worsened acidosis (bicarb of 11 and pH of 7.2).  PCCM was consulted who recommended medical management.  She was started on bicarb drip.  Her hypertension and acute kidney injury was secondary to hypovolemia and dehydration with poor p.o. intake. Hospital course now prolonged with A. fib with RVR and pulmonary edema   Subjective:   Son present at bedside.  Patient reports breathing fine.  Still has flat affect.  Again had a long discussion and now agrees to go to SNF.  Noted for PVCs and short runs of NSVT on the monitor.   Assessment  & Plan :   Principal problem Acute on chronic systolic CHF (HCC) Continue torsemide 40 mg daily.  Having good urine output.  not on ACE inhibitors/ARB due to her AKI.  Continue metoprolol.  If renal function and blood pressure continues to remain  stable possibly add low-dose. Cardiology signed off  Active problems Persistent A. fib with RVR CHA2DS2/VASC of 6.  On Eliquis.  Currently on amiodarone, cardiology recommend stopping this if son does not want cardioversion. She was cardioverted in March with return to A. fib 2 months later when she got admitted.  Was also given digoxin initially, now discontinued.  Metoprolol dose increased to 50 mg for better rate control (100 mg  daily).  Getting lower dose due to hypotension on presentation.   PVCs and NSVT. Check magnesium and potassium.  Continue metoprolol.  If blood pressure stable will plan on increasing dose of 75 mg daily.  Acute on chronic kidney disease stage III (HCC) Baseline creatinine of 1.8-2.  Suspect secondary to hypovolemia and hypotension.  Received IV fluids.  Creatinine was 4.8 on admission, now improved to baseline.   Acute metabolic encephalopathy/hepatic encephalopathy Noted for elevated ammonia of 90 and received lactulose.  Also contributed by hypotension and dehydration.  Encephalopathy has resolved but patient seems depressed and anxious.     Anxiety and major depression. Patient is significantly depressed and withdrawn since her daughter's death few weeks back.  She is already on Remeron, buspirone and Celexa without much improvement. Psych consulted and appreciate recommendation.  No imminent risk to self or others.  Discontinued Remeron given side effects of fluid retention.  Continue Celexa at current dose and have increased buspirone to 7.5 mg twice daily.  Psych recommends dose can be increased up to 10 mg if no improvement seen.   Acute metabolic acidosis Possibly secondary to ATN.  Resolved with bicarb drip.  Acute respiratory failure with hypoxia Secondary to fluid overload Continues to improve with diuresis.  Hypoglycemia Secondary to poor p.o. intake.  Now resolved  Hypothyroidism Continue Synthroid.  Levels normal.  Acute gouty arthritis  (right big toe). Improved with 3-day course of prednisone.  Hyperkalemia Received bicarbonate Lokelma.  Now resolved.  Hypomagnesemia Replenished  Transaminitis Possibly shock liver.  Now resolving.  Coag negative blood culture Suspect contaminant.  Received  5 days of antibiotic.   Goals of care Patient is significantly deconditioned.  PT recommends SNF.  Son is very worried that she will continue to decline when she gets to SNF since patient is strongly wanting to go home.  He has limited resource to provide 24-hour supervision for her at home. Had extensive discussion with son at bedside on 7/24 by myself and palliative care. Ask PT to reevaluate patient if she can qualify for CIR but they suggest that patient is extremely deconditioned and can not tolerate 3 hours of therapy on CIR.  She would also need 24-hour assistance even after a CIR stay.  Still recommend SNF.  Again had a long discussion with patient and her son today.  They are finally in agreement with going to SNF. We will consult social worker to send a referral.   Code Status : Full code   Family Communication  : Discussed with son at bedside  Disposition Plan  : Possibly SNF on 7/27 if symptoms continue to improve.  Barriers For Discharge : Active symptoms  Consults  : Cardiology, palliative care  Procedures  : 2D echo  DVT Prophylaxis  : Eliquis  Lab Results  Component Value Date   PLT 152 01/04/2019    Antibiotics  :    Anti-infectives (From admission, onward)   Start     Dose/Rate Route Frequency Ordered Stop   12/30/18 2200  ceFAZolin (ANCEF) IVPB 1 g/50 mL premix  Status:  Discontinued     1 g 100 mL/hr over 30 Minutes Intravenous Every 12 hours 12/30/18 1318 01/02/19 1301   12/29/18 1400  piperacillin-tazobactam (ZOSYN) IVPB 2.25 g  Status:  Discontinued     2.25 g 100 mL/hr over 30 Minutes Intravenous Every 8 hours 12/29/18 1316 12/30/18 1309   12/29/18 1309  vancomycin variable dose per  unstable renal function (pharmacist dosing)  Status:  Discontinued      Does not apply See admin instructions 12/29/18 1309 12/30/18 1308   12/29/18 1200  ceFEPIme (MAXIPIME) 1 g in sodium chloride 0.9 % 100 mL IVPB  Status:  Discontinued     1 g 200 mL/hr over 30 Minutes Intravenous Every 24 hours 12/28/18 1242 12/29/18 1233   12/28/18 1245  ceFEPIme (MAXIPIME) 2 g in sodium chloride 0.9 % 100 mL IVPB     2 g 200 mL/hr over 30 Minutes Intravenous  Once 12/28/18 1233 12/28/18 1333   12/28/18 1245  metroNIDAZOLE (FLAGYL) IVPB 500 mg     500 mg 100 mL/hr over 60 Minutes Intravenous  Once 12/28/18 1233 12/28/18 1403   12/28/18 1245  vancomycin (VANCOCIN) IVPB 1000 mg/200 mL premix     1,000 mg 200 mL/hr over 60 Minutes Intravenous  Once 12/28/18 1233 12/28/18 1402        Objective:   Vitals:   01/07/19 1549 01/07/19 2100 01/08/19 0300 01/08/19 0810  BP: 108/77 115/68  116/60  Pulse: (!) 55 77  87  Resp: 18 20  18   Temp: 98.4 F (36.9 C) 97.6 F (36.4 C)  98.2 F (36.8 C)  TempSrc:  Oral  Oral  SpO2: 98% 100%    Weight:   101.5 kg   Height:        Wt Readings from Last 3 Encounters:  01/08/19 101.5 kg  12/01/18 88.5 kg  11/01/18 88.5 kg     Intake/Output Summary (Last 24 hours) at 01/08/2019 1421 Last data filed at 01/08/2019 1339 Gross per 24 hour  Intake 1458 ml  Output 1251 ml  Net 207 ml   Physical exam Fatigued, flat affect, not in distress HEENT: Supple neck Chest: Clear bilaterally CVs: S1-S2 regular, no murmur GI: Soft, nondistended nontender Musculoskeletal: Warm, bruising over left forearm 1+ pitting edema bilaterally (improved from yesterday)     Data Review:    CBC Recent Labs  Lab 01/02/19 0704 01/03/19 0705 01/04/19 0736  WBC 7.1 7.3 11.6*  HGB 13.0 11.9* 14.4  HCT 41.3 36.5 45.3  PLT 110* 92* 152  MCV 99.0 94.1 95.8  MCH 31.2 30.7 30.4  MCHC 31.5 32.6 31.8  RDW 16.1* 15.7* 16.0*    Chemistries  Recent Labs  Lab 01/02/19 1026  01/03/19 0705 01/04/19 0735 01/06/19 0938 01/06/19 1655 01/07/19 0238  NA 139 138 135 137  --  136  K 3.4* 3.8 4.5 4.4  --  3.4*  CL 98 99 98 96*  --  96*  CO2 30 30 27 27   --  30  GLUCOSE 197* 99 138* 100*  --  87  BUN 34* 34* 36* 38*  --  36*  CREATININE 1.81* 1.60* 1.65* 1.71*  --  1.67*  CALCIUM 8.5* 7.8* 8.6* 8.4*  --  7.7*  MG  --  1.4* 1.7 1.6*  --   --   AST  --  58*  --   --  51*  --   ALT  --  48*  --   --  40  --   ALKPHOS  --  149*  --   --  168*  --   BILITOT  --  1.7*  --   --  1.6*  --    ------------------------------------------------------------------------------------------------------------------ No results for input(s): CHOL, HDL, LDLCALC, TRIG, CHOLHDL, LDLDIRECT in the last 72 hours.  Lab Results  Component Value Date   HGBA1C 5.4 06/07/2018   ------------------------------------------------------------------------------------------------------------------ No results for input(s): TSH,  T4TOTAL, T3FREE, THYROIDAB in the last 72 hours.  Invalid input(s): FREET3 ------------------------------------------------------------------------------------------------------------------ No results for input(s): VITAMINB12, FOLATE, FERRITIN, TIBC, IRON, RETICCTPCT in the last 72 hours.  Coagulation profile No results for input(s): INR, PROTIME in the last 168 hours.  No results for input(s): DDIMER in the last 72 hours.  Cardiac Enzymes No results for input(s): CKMB, TROPONINI, MYOGLOBIN in the last 168 hours.  Invalid input(s): CK ------------------------------------------------------------------------------------------------------------------    Component Value Date/Time   BNP 1,262.8 (H) 10/13/2018 0724   BNP 73.4 03/26/2016 1525    Inpatient Medications  Scheduled Meds:  allopurinol  100 mg Oral Daily   amiodarone  200 mg Oral Daily   apixaban  5 mg Oral BID   busPIRone  7.5 mg Oral BID   citalopram  20 mg Oral Daily   colchicine  0.6 mg Oral  Daily   feeding supplement  1 Container Oral TID BM   Gerhardt's butt cream   Topical TID   insulin aspart  0-9 Units Subcutaneous TID WC   levothyroxine  100 mcg Oral Q0600   magnesium oxide  200 mg Oral BID   metoprolol succinate  50 mg Oral Daily   torsemide  40 mg Oral Daily   Continuous Infusions: PRN Meds:.clonazePAM, HYDROcodone-acetaminophen, lip balm, metoprolol tartrate, polyethylene glycol, sodium chloride flush  Micro Results No results found for this or any previous visit (from the past 240 hour(s)).  Radiology Reports Ct Abdomen Wo Contrast  Result Date: 12/28/2018 CLINICAL DATA:  Elevated liver function tests. Nausea vomiting. EXAM: CT ABDOMEN W ITHOUT CONTRAST TECHNIQUE: Multidetector CT imaging of the abdomen was performed following the standard protocol without IV contrast. COMPARISON:  August 30, 2015 FINDINGS: Lower chest: Partially visualized area of scarring/atelectasis in the lingula. Atelectasis versus airspace consolidation in the dependent portion of the left lower lobe. Enlarged heart. Minimal pericardial thickening versus tiny effusion. Calcific atherosclerotic disease of the aorta and coronary arteries, mild. Hepatobiliary: Normal appearance of the liver. The gallbladder is not abnormally distended but demonstrates a hyperdense appearance. Pancreas: Fatty replaced pancreas. Spleen: Normal in size without focal abnormality. Adrenals/Urinary Tract: Mild left adrenal thickening. Normal nonenhanced appearance of the kidneys. Stomach/Bowel: Stomach is within normal limits. No evidence of bowel wall thickening, distention, or inflammatory changes. Vascular/Lymphatic: Aortic atherosclerosis. No enlarged abdominal lymph nodes. Other: No abdominal wall hernia or abnormality. Musculoskeletal: Spondylosis the lumbosacral spine. IMPRESSION: 1. Unusual hyperdense appearance of the gallbladder. If the patient has had recent administration of IV contrast, this may represent  vicarious excretion of contrast. Otherwise MRCP or ERCP may be considered for further investigation. 2. Enlarged heart. Minimal pericardial thickening versus tiny effusion. 3. Partially visualized area of scarring/atelectasis in the lingula. 4. Atelectasis versus airspace consolidation in the dependent portion of the left lower lobe. 5. Calcific atherosclerotic disease of the aorta and coronary arteries. Electronically Signed   By: Fidela Salisbury M.D.   On: 12/28/2018 14:22   US Renal  Result Date: 12/31/2018 CLINICAL DATA:  Acute renal insufficiency. EXAM: RENAL / URINARY TRACT ULTRASOUND COMPLETE COMPARISON:  CT 12/28/2018 FINDINGS: Right Kidney: Renal measurements: 10.9 x 4.1 x 4.0 cm = volume: 92 mL . Echogenicity within normal limits. No mass or hydronephrosis visualized. 4 mm indeterminate hyperechoic focus over the mid pole cortex which is not seen on recent noncontrast CT. Left Kidney: Renal measurements: 8.9 x 4.5 x 5.0 cm = volume: 104 mL. Echogenicity within normal limits. No mass or hydronephrosis visualized. Bladder: Appears normal for degree of bladder distention.  IMPRESSION: Normal size kidneys without hydronephrosis. Indeterminate 4 mm hyperechoic focus over the mid pole cortex of the right kidney which is not seen on recent noncontrast CT. Recommend follow-up ultrasound 6 months. Electronically Signed   By: Marin Olp M.D.   On: 12/31/2018 16:37   Dg Chest Port 1 View  Result Date: 01/04/2019 CLINICAL DATA:  Increased shortness of breath tonight. History of CHF. EXAM: PORTABLE CHEST 1 VIEW COMPARISON:  Radiograph 01/01/2019 FINDINGS: Cardiomegaly, unchanged. Aortic atherosclerosis. Progressive peribronchial thickening suspicious for pulmonary edema. Hazy bibasilar opacities consistent with pleural effusions. No pneumothorax. IMPRESSION: Development of pulmonary edema and bilateral pleural effusions consistent with CHF. Cardiomegaly is unchanged. Electronically Signed   By: Keith Rake M.D.   On: 01/04/2019 21:53   Dg Chest Port 1 View  Result Date: 01/01/2019 CLINICAL DATA:  Hypoxemia, shortness of breath with weakness today. EXAM: PORTABLE CHEST 1 VIEW COMPARISON:  Chest x-rays dated 12/28/2018 and 10/15/2018. FINDINGS: Chronic opacity at the LEFT lung base presumed scarring/atelectasis based on recent CT abdomen of 12/28/2018. No new lung findings. No pneumothorax seen. Stable cardiomegaly. IMPRESSION: Stable chest x-ray. No evidence of acute cardiopulmonary abnormality. No evidence of pneumonia or pulmonary edema. Electronically Signed   By: Franki Cabot M.D.   On: 01/01/2019 08:13   Dg Chest Port 1 View  Result Date: 12/28/2018 CLINICAL DATA:  Generalized weakness for 3 days. EXAM: PORTABLE CHEST 1 VIEW COMPARISON:  10/15/2018 FINDINGS: The heart is enlarged but stable. There is tortuosity and calcification of the thoracic aorta. The lungs are grossly clear. No infiltrates, edema or effusions. The bony thorax is intact. IMPRESSION: Stable cardiac enlargement but no acute pulmonary findings. Electronically Signed   By: Marijo Sanes M.D.   On: 12/28/2018 10:40   US Abdomen Limited Ruq  Result Date: 12/29/2018 CLINICAL DATA:  Abnormal transaminase level. EXAM: ULTRASOUND ABDOMEN LIMITED RIGHT UPPER QUADRANT COMPARISON:  CT scan of December 28, 2018. FINDINGS: Gallbladder: No gallstones or wall thickening visualized. No sonographic Murphy sign noted by sonographer. Common bile duct: Diameter: 4 mm which is within normal limits. Liver: No focal lesion identified. Increased echogenicity of hepatic parenchyma is noted suggesting hepatic steatosis or possibly other diffuse hepatocellular disease. Portal vein is patent on color Doppler imaging with normal direction of blood flow towards the liver. IMPRESSION: Mildly increased echogenicity of hepatic parenchyma is noted suggesting hepatic steatosis or other diffuse hepatocellular disease. No other abnormality seen in the right upper  quadrant of the abdomen. Electronically Signed   By: Marijo Conception M.D.   On: 12/29/2018 12:08    Time Spent in minutes 35   Kymberley Raz M.D on 01/08/2019 at 2:21 PM  Between 7am to 7pm - Pager - [phone]  After 7pm go to [email or web address] - password [facility]  [facility] -  Office  [phone]

## 2019-01-09 DIAGNOSIS — E876 Hypokalemia: Secondary | ICD-10-CM

## 2019-01-09 LAB — BASIC METABOLIC PANEL
Anion gap: 10 (ref 5–15)
BUN: 29 mg/dL — ABNORMAL HIGH (ref 8–23)
CO2: 34 mmol/L — ABNORMAL HIGH (ref 22–32)
Calcium: 7.8 mg/dL — ABNORMAL LOW (ref 8.9–10.3)
Chloride: 93 mmol/L — ABNORMAL LOW (ref 98–111)
Creatinine, Ser: 1.46 mg/dL — ABNORMAL HIGH (ref 0.44–1.00)
GFR calc Af Amer: 40 mL/min — ABNORMAL LOW (ref >60)
GFR calc non Af Amer: 34 mL/min — ABNORMAL LOW (ref >60)
Glucose, Bld: 78 mg/dL (ref 70–99)
Potassium: 3.2 mmol/L — ABNORMAL LOW (ref 3.5–5.1)
Sodium: 137 mmol/L (ref 135–145)

## 2019-01-09 LAB — GLUCOSE, CAPILLARY
Glucose-Capillary: 77 mg/dL (ref 70–99)
Glucose-Capillary: 92 mg/dL (ref 70–99)
Glucose-Capillary: 165 mg/dL — ABNORMAL HIGH (ref 70–99)
Glucose-Capillary: 117 mg/dL — ABNORMAL HIGH (ref 70–99)

## 2019-01-09 LAB — MAGNESIUM: Magnesium: 1.3 mg/dL — ABNORMAL LOW (ref 1.7–2.4)

## 2019-01-09 MED ORDER — DIPHENHYDRAMINE HCL 50 MG/ML IJ SOLN
12.5000 mg | Freq: Once | INTRAMUSCULAR | Status: AC
  Administered 2019-01-09: 12.5 mg via INTRAVENOUS
  Filled 2019-01-09: qty 1

## 2019-01-09 MED ORDER — MAGNESIUM SULFATE 4 GM/100ML IV SOLN
4.0000 g | Freq: Once | INTRAVENOUS | Status: AC
  Administered 2019-01-09: 4 g via INTRAVENOUS
  Filled 2019-01-09: qty 100

## 2019-01-09 MED ORDER — POTASSIUM CHLORIDE CRYS ER 20 MEQ PO TBCR
40.0000 meq | EXTENDED_RELEASE_TABLET | ORAL | Status: AC
  Administered 2019-01-09 (×2): 40 meq via ORAL
  Filled 2019-01-09 (×2): qty 2

## 2019-01-09 NOTE — Progress Notes (Signed)
PROGRESS NOTE                                                                                                                                                                                                             Patient Demographics:    Teresa [name], is a 78 y.o. female, DOB - 1940/07/17, GDJ:[id number]  Admit date - 12/28/2018   Admitting Physician Vashti Hey, MD  Outpatient Primary MD for the patient is [name], MD  LOS - 12  Outpatient Specialists: cardiology  No chief complaint on file.      Brief Narrative 78 year old female with history of hypertension, diabetes mellitus, coronary artery disease with chronic systolic CHF (EF of 62-22%), A. fib on anticoagulation and depression presented to the ED with increasing weakness.  Patient has started eating and drinking poorly since 10 days prior to admission after her daughter died from CHF.  Patient presented with hyperglycemia, hypertension, acute on chronic kidney injury and lactic acidosis. On the day following admission she was found to be increasingly lethargic with worsened acidosis (bicarb of 11 and pH of 7.2).  PCCM was consulted who recommended medical management.  She was started on bicarb drip.  Her hypertension and acute kidney injury was secondary to hypovolemia and dehydration with poor p.o. intake. Hospital course now prolonged with A. fib with RVR and pulmonary edema   Subjective:   Still has flat affect.  Denies any increased shortness of breath.  Few episodes of NSVT on the monitor.   Assessment  & Plan :   Principal problem Acute on chronic systolic CHF (HCC) Continue torsemide 40 mg daily.  Has had excellent urine output in the past 24 hours (2.7 L).  Not on ACE inhibitors/ARB due to her AKI.  Continue metoprolol.  If renal function and blood pressure continues to remain stable possibly add low-dose. Cardiology signed  off.  Active problems Persistent A. fib with RVR CHA2DS2/VASC of 6.  On Eliquis.  Currently on amiodarone, cardiology recommend stopping this if son does not want cardioversion.  I will discuss with him. She was cardioverted in March with return to A. fib 2 months later when she got admitted.  Was also given digoxin initially, now discontinued.  Metoprolol dose increased to 50 mg for better rate control (100 mg daily).  Getting lower dose due  to hypotension on presentation.   PVCs and NSVT. Low potassium and magnesium being replenished.  Recheck labs in a.m.  Continue metoprolol at current dose.  Acute on chronic kidney disease stage III (HCC) Baseline creatinine of 1.8-2.  Suspect secondary to hypovolemia and hypotension.  Received IV fluids.  Creatinine was 4.8 on admission, now improved to baseline.   Acute metabolic encephalopathy/hepatic encephalopathy Noted for elevated ammonia of 90 and received lactulose.  Also contributed by hypotension and dehydration.  Encephalopathy has resolved but patient seems depressed and anxious.     Anxiety and major depression. Patient is significantly depressed and withdrawn since her daughter's death few weeks back.  She is already on Remeron, buspirone and Celexa without much improvement. Psych consulted and appreciate recommendation.  No imminent risk to self or others.  Discontinued Remeron given side effects of fluid retention.  Continue Celexa at current dose and have increased buspirone to 7.5 mg twice daily.  Psych recommends dose can be increased up to 10 mg if no improvement seen.   Acute metabolic acidosis Possibly secondary to ATN.  Resolved with bicarb drip.  Acute respiratory failure with hypoxia Secondary to fluid overload Continues to improve with diuresis.  Hypoglycemia Secondary to poor p.o. intake.  Now resolved  Hypothyroidism Continue Synthroid.  Levels normal.  Acute gouty arthritis (right big toe). Improved with 3-day  course of prednisone.  Hyperkalemia Received bicarbonate Lokelma.  Now resolved.  Hypomagnesemia Replenished  Transaminitis Possibly shock liver.  Now resolving.  Coag negative blood culture Suspect contaminant.  Received  5 days of antibiotic.   Goals of care Patient is significantly deconditioned.  PT recommends SNF.  Son is very worried that she will continue to decline when she gets to SNF since patient is strongly wanting to go home.  He has limited resource to provide 24-hour supervision for her at home. Had extensive discussion with son at bedside on 7/24 by myself and palliative care. Ask PT to reevaluate patient if she can qualify for CIR but they suggest that patient is extremely deconditioned and can not tolerate 3 hours of therapy on CIR.  She would also need 24-hour assistance even after a CIR stay.  Still recommend SNF.  Patient and son now agree with SNF.  Referral sent.   Code Status : Full code   Family Communication  : Discussed with son at bedside  Disposition Plan  : Possibly SNF on 7/27 if symptoms continue to improve.  Barriers For Discharge : Active symptoms  Consults  : Cardiology, palliative care  Procedures  : 2D echo  DVT Prophylaxis  : Eliquis  Lab Results  Component Value Date   PLT 152 01/04/2019    Antibiotics  :    Anti-infectives (From admission, onward)   Start     Dose/Rate Route Frequency Ordered Stop   12/30/18 2200  ceFAZolin (ANCEF) IVPB 1 g/50 mL premix  Status:  Discontinued     1 g 100 mL/hr over 30 Minutes Intravenous Every 12 hours 12/30/18 1318 01/02/19 1301   12/29/18 1400  piperacillin-tazobactam (ZOSYN) IVPB 2.25 g  Status:  Discontinued     2.25 g 100 mL/hr over 30 Minutes Intravenous Every 8 hours 12/29/18 1316 12/30/18 1309   12/29/18 1309  vancomycin variable dose per unstable renal function (pharmacist dosing)  Status:  Discontinued      Does not apply See admin instructions 12/29/18 1309 12/30/18 1308    12/29/18 1200  ceFEPIme (MAXIPIME) 1 g in sodium chloride 0.9 %  100 mL IVPB  Status:  Discontinued     1 g 200 mL/hr over 30 Minutes Intravenous Every 24 hours 12/28/18 1242 12/29/18 1233   12/28/18 1245  ceFEPIme (MAXIPIME) 2 g in sodium chloride 0.9 % 100 mL IVPB     2 g 200 mL/hr over 30 Minutes Intravenous  Once 12/28/18 1233 12/28/18 1333   12/28/18 1245  metroNIDAZOLE (FLAGYL) IVPB 500 mg     500 mg 100 mL/hr over 60 Minutes Intravenous  Once 12/28/18 1233 12/28/18 1403   12/28/18 1245  vancomycin (VANCOCIN) IVPB 1000 mg/200 mL premix     1,000 mg 200 mL/hr over 60 Minutes Intravenous  Once 12/28/18 1233 12/28/18 1402        Objective:   Vitals:   01/08/19 1644 01/08/19 2100 01/09/19 0300 01/09/19 0755  BP: 107/64 112/67  106/64  Pulse: 78 72  88  Resp: 18 (!) 24  20  Temp: 97.6 F (36.4 C) 99.4 F (37.4 C)    TempSrc: Oral Oral    SpO2: 100% 90%  99%  Weight:   97 kg   Height:        Wt Readings from Last 3 Encounters:  01/09/19 97 kg  12/01/18 88.5 kg  11/01/18 88.5 kg     Intake/Output Summary (Last 24 hours) at 01/09/2019 1311 Last data filed at 01/09/2019 1200 Gross per 24 hour  Intake 720 ml  Output 3775 ml  Net -3055 ml   Physical exam Elderly female not in distress HEENT: Moist mucosa, supple neck Chest: Clear CVs: Normal S1-S2, no murmur GI: Soft, nondistended, nontender Musculoskeletal: Warm, 1+ pitting edema bilaterally (improving)      Data Review:    CBC Recent Labs  Lab 01/03/19 0705 01/04/19 0736  WBC 7.3 11.6*  HGB 11.9* 14.4  HCT 36.5 45.3  PLT 92* 152  MCV 94.1 95.8  MCH 30.7 30.4  MCHC 32.6 31.8  RDW 15.7* 16.0*    Chemistries  Recent Labs  Lab 01/03/19 0705 01/04/19 0735 01/06/19 0938 01/06/19 1655 01/07/19 0238 01/08/19 1434 01/09/19 0545  NA 138 135 137  --  136 136 137  K 3.8 4.5 4.4  --  3.4* 3.1* 3.2*  CL 99 98 96*  --  96* 94* 93*  CO2 30 27 27   --  30 31 34*  GLUCOSE 99 138* 100*  --  87 136* 78   BUN 34* 36* 38*  --  36* 30* 29*  CREATININE 1.60* 1.65* 1.71*  --  1.67* 1.50* 1.46*  CALCIUM 7.8* 8.6* 8.4*  --  7.7* 8.1* 7.8*  MG 1.4* 1.7 1.6*  --   --  1.4* 1.3*  AST 58*  --   --  51*  --   --   --   ALT 48*  --   --  40  --   --   --   ALKPHOS 149*  --   --  168*  --   --   --   BILITOT 1.7*  --   --  1.6*  --   --   --    ------------------------------------------------------------------------------------------------------------------ No results for input(s): CHOL, HDL, LDLCALC, TRIG, CHOLHDL, LDLDIRECT in the last 72 hours.  Lab Results  Component Value Date   HGBA1C 5.4 06/07/2018   ------------------------------------------------------------------------------------------------------------------ No results for input(s): TSH, T4TOTAL, T3FREE, THYROIDAB in the last 72 hours.  Invalid input(s): FREET3 ------------------------------------------------------------------------------------------------------------------ No results for input(s): VITAMINB12, FOLATE, FERRITIN, TIBC, IRON, RETICCTPCT in the last 72  hours.  Coagulation profile No results for input(s): INR, PROTIME in the last 168 hours.  No results for input(s): DDIMER in the last 72 hours.  Cardiac Enzymes No results for input(s): CKMB, TROPONINI, MYOGLOBIN in the last 168 hours.  Invalid input(s): CK ------------------------------------------------------------------------------------------------------------------    Component Value Date/Time   BNP 1,262.8 (H) 10/13/2018 0724   BNP 73.4 03/26/2016 1525    Inpatient Medications  Scheduled Meds:  allopurinol  100 mg Oral Daily   amiodarone  200 mg Oral Daily   apixaban  5 mg Oral BID   busPIRone  7.5 mg Oral BID   citalopram  20 mg Oral Daily   colchicine  0.6 mg Oral Daily   feeding supplement  1 Container Oral TID BM   Gerhardt's butt cream   Topical TID   insulin aspart  0-9 Units Subcutaneous TID WC   levothyroxine  100 mcg Oral Q0600    magnesium oxide  200 mg Oral BID   metoprolol succinate  50 mg Oral Daily   potassium chloride  40 mEq Oral Q4H   torsemide  40 mg Oral Daily   Continuous Infusions:  magnesium sulfate bolus IVPB     PRN Meds:.clonazePAM, HYDROcodone-acetaminophen, lip balm, metoprolol tartrate, polyethylene glycol, sodium chloride flush  Micro Results No results found for this or any previous visit (from the past 240 hour(s)).  Radiology Reports Ct Abdomen Wo Contrast  Result Date: 12/28/2018 CLINICAL DATA:  Elevated liver function tests. Nausea vomiting. EXAM: CT ABDOMEN W ITHOUT CONTRAST TECHNIQUE: Multidetector CT imaging of the abdomen was performed following the standard protocol without IV contrast. COMPARISON:  August 30, 2015 FINDINGS: Lower chest: Partially visualized area of scarring/atelectasis in the lingula. Atelectasis versus airspace consolidation in the dependent portion of the left lower lobe. Enlarged heart. Minimal pericardial thickening versus tiny effusion. Calcific atherosclerotic disease of the aorta and coronary arteries, mild. Hepatobiliary: Normal appearance of the liver. The gallbladder is not abnormally distended but demonstrates a hyperdense appearance. Pancreas: Fatty replaced pancreas. Spleen: Normal in size without focal abnormality. Adrenals/Urinary Tract: Mild left adrenal thickening. Normal nonenhanced appearance of the kidneys. Stomach/Bowel: Stomach is within normal limits. No evidence of bowel wall thickening, distention, or inflammatory changes. Vascular/Lymphatic: Aortic atherosclerosis. No enlarged abdominal lymph nodes. Other: No abdominal wall hernia or abnormality. Musculoskeletal: Spondylosis the lumbosacral spine. IMPRESSION: 1. Unusual hyperdense appearance of the gallbladder. If the patient has had recent administration of IV contrast, this may represent vicarious excretion of contrast. Otherwise MRCP or ERCP may be considered for further investigation. 2.  Enlarged heart. Minimal pericardial thickening versus tiny effusion. 3. Partially visualized area of scarring/atelectasis in the lingula. 4. Atelectasis versus airspace consolidation in the dependent portion of the left lower lobe. 5. Calcific atherosclerotic disease of the aorta and coronary arteries. Electronically Signed   By: Fidela Salisbury M.D.   On: 12/28/2018 14:22   US Renal  Result Date: 12/31/2018 CLINICAL DATA:  Acute renal insufficiency. EXAM: RENAL / URINARY TRACT ULTRASOUND COMPLETE COMPARISON:  CT 12/28/2018 FINDINGS: Right Kidney: Renal measurements: 10.9 x 4.1 x 4.0 cm = volume: 92 mL . Echogenicity within normal limits. No mass or hydronephrosis visualized. 4 mm indeterminate hyperechoic focus over the mid pole cortex which is not seen on recent noncontrast CT. Left Kidney: Renal measurements: 8.9 x 4.5 x 5.0 cm = volume: 104 mL. Echogenicity within normal limits. No mass or hydronephrosis visualized. Bladder: Appears normal for degree of bladder distention. IMPRESSION: Normal size kidneys without hydronephrosis. Indeterminate 4 mm  hyperechoic focus over the mid pole cortex of the right kidney which is not seen on recent noncontrast CT. Recommend follow-up ultrasound 6 months. Electronically Signed   By: Marin Olp M.D.   On: 12/31/2018 16:37   Dg Chest Port 1 View  Result Date: 01/04/2019 CLINICAL DATA:  Increased shortness of breath tonight. History of CHF. EXAM: PORTABLE CHEST 1 VIEW COMPARISON:  Radiograph 01/01/2019 FINDINGS: Cardiomegaly, unchanged. Aortic atherosclerosis. Progressive peribronchial thickening suspicious for pulmonary edema. Hazy bibasilar opacities consistent with pleural effusions. No pneumothorax. IMPRESSION: Development of pulmonary edema and bilateral pleural effusions consistent with CHF. Cardiomegaly is unchanged. Electronically Signed   By: Keith Rake M.D.   On: 01/04/2019 21:53   Dg Chest Port 1 View  Result Date: 01/01/2019 CLINICAL DATA:   Hypoxemia, shortness of breath with weakness today. EXAM: PORTABLE CHEST 1 VIEW COMPARISON:  Chest x-rays dated 12/28/2018 and 10/15/2018. FINDINGS: Chronic opacity at the LEFT lung base presumed scarring/atelectasis based on recent CT abdomen of 12/28/2018. No new lung findings. No pneumothorax seen. Stable cardiomegaly. IMPRESSION: Stable chest x-ray. No evidence of acute cardiopulmonary abnormality. No evidence of pneumonia or pulmonary edema. Electronically Signed   By: Franki Cabot M.D.   On: 01/01/2019 08:13   Dg Chest Port 1 View  Result Date: 12/28/2018 CLINICAL DATA:  Generalized weakness for 3 days. EXAM: PORTABLE CHEST 1 VIEW COMPARISON:  10/15/2018 FINDINGS: The heart is enlarged but stable. There is tortuosity and calcification of the thoracic aorta. The lungs are grossly clear. No infiltrates, edema or effusions. The bony thorax is intact. IMPRESSION: Stable cardiac enlargement but no acute pulmonary findings. Electronically Signed   By: Marijo Sanes M.D.   On: 12/28/2018 10:40   US Abdomen Limited Ruq  Result Date: 12/29/2018 CLINICAL DATA:  Abnormal transaminase level. EXAM: ULTRASOUND ABDOMEN LIMITED RIGHT UPPER QUADRANT COMPARISON:  CT scan of December 28, 2018. FINDINGS: Gallbladder: No gallstones or wall thickening visualized. No sonographic Murphy sign noted by sonographer. Common bile duct: Diameter: 4 mm which is within normal limits. Liver: No focal lesion identified. Increased echogenicity of hepatic parenchyma is noted suggesting hepatic steatosis or possibly other diffuse hepatocellular disease. Portal vein is patent on color Doppler imaging with normal direction of blood flow towards the liver. IMPRESSION: Mildly increased echogenicity of hepatic parenchyma is noted suggesting hepatic steatosis or other diffuse hepatocellular disease. No other abnormality seen in the right upper quadrant of the abdomen. Electronically Signed   By: Marijo Conception M.D.   On: 12/29/2018 12:08     Time Spent in minutes 35   Emoni Yang M.D on 01/09/2019 at 1:11 PM  Between 7am to 7pm - Pager - [phone]  After 7pm go to [email or web address] - password [facility]  [facility] -  Office  [phone]

## 2019-01-09 NOTE — Progress Notes (Signed)
CSW spoke with the patient's son about facility choices. [name] stated he would prefer [place], [place], [place], and [place]. Please provide bed offers to the patient's son.   CSW faxed the patient's clinicals to [place] [facility]. CSW placed the original copies in her box.   CSW will continue to follow and assist with disposition.   Domenic Schwab, MSW, [place] Worker [facility]  [phone]

## 2019-01-10 LAB — GLUCOSE, CAPILLARY
Glucose-Capillary: 105 mg/dL — ABNORMAL HIGH (ref 70–99)
Glucose-Capillary: 111 mg/dL — ABNORMAL HIGH (ref 70–99)
Glucose-Capillary: 115 mg/dL — ABNORMAL HIGH (ref 70–99)
Glucose-Capillary: 145 mg/dL — ABNORMAL HIGH (ref 70–99)

## 2019-01-10 LAB — BASIC METABOLIC PANEL
Anion gap: 12 (ref 5–15)
BUN: 25 mg/dL — ABNORMAL HIGH (ref 8–23)
CO2: 33 mmol/L — ABNORMAL HIGH (ref 22–32)
Calcium: 8.2 mg/dL — ABNORMAL LOW (ref 8.9–10.3)
Chloride: 92 mmol/L — ABNORMAL LOW (ref 98–111)
Creatinine, Ser: 1.26 mg/dL — ABNORMAL HIGH (ref 0.44–1.00)
GFR calc Af Amer: 47 mL/min — ABNORMAL LOW (ref >60)
GFR calc non Af Amer: 41 mL/min — ABNORMAL LOW (ref >60)
Glucose, Bld: 105 mg/dL — ABNORMAL HIGH (ref 70–99)
Potassium: 3.8 mmol/L (ref 3.5–5.1)
Sodium: 137 mmol/L (ref 135–145)

## 2019-01-10 LAB — MAGNESIUM: Magnesium: 1.8 mg/dL (ref 1.7–2.4)

## 2019-01-10 MED ORDER — MELATONIN 3 MG PO TABS
3.0000 mg | ORAL_TABLET | Freq: Every evening | ORAL | Status: DC | PRN
  Administered 2019-01-10 – 2019-01-12 (×3): 3 mg via ORAL
  Filled 2019-01-10 (×5): qty 1

## 2019-01-10 MED ORDER — CLONAZEPAM 0.25 MG PO TBDP: 0.2500 mg | ORAL_TABLET | Freq: Two times a day (BID) | ORAL | Status: DC | PRN

## 2019-01-10 NOTE — TOC Progression Note (Signed)
Transition of Care [facility] [address]) - Progression Note    Patient Details  Name: Teresa [name] MRN: [id number] Date of Birth: 03-Sep-1940  Transition of Care [facility]) CM/SW [place], [place] Phone Number: 01/10/2019, 2:02 PM  Clinical Narrative:     CSW called and gave bed offers to the patient's son. He chose [facility] in [place], [place].   [place] called and spoke with Elyse Hsu at [facility], they are able to offer on the patient. They will not have a bed available until Tuesday. Elyse Hsu asked for an updated COVID screen.   CSW paged the MD and informed him of when the patient's bed would be available. He stated he would order a COVID screen.   CSW called and spoke with [name], [place] informed him that his mother would be discharged on Tuesday. CSW provided [facility] contact information because she would be out of the office on Tuesday.   CSW will continue to follow.   Expected Discharge Plan: Skilled Nursing Facility Barriers to Discharge: Continued Medical Work up, SNF Pending bed offer  Expected Discharge Plan and Services Expected Discharge Plan: [place] In-house Referral: Clinical Social Work Discharge Planning Services: CM Consult Post Acute Care Choice: [place] arrangements for the past 2 months: Single Family Home                 DME Arranged: N/A DME Agency: NA       HH Arranged: NA HH Agency: NA Date HH Agency Contacted: 12/29/18 Time [place]: 1134 Representative spoke with at [place]: [place] ([place]) Interventions    Readmission Risk Interventions Readmission Risk Prevention Plan 10/18/2018 10/15/2018  Transportation Screening - Complete  HRI or [place] - Complete  Social Work Consult for [place] Planning/Counseling Complete -  Palliative Care Screening - Not Applicable  Medication Review [employer]) - Complete  Some  recent data might be hidden

## 2019-01-10 NOTE — Progress Notes (Signed)
PROGRESS NOTE                                                                                                                                                                                                             Patient Demographics:    Teresa [name], is a 78 y.o. female, DOB - May 16, 1941, PTW:[id number]  Admit date - 12/28/2018   Admitting Physician Vashti Hey, MD  Outpatient Primary MD for the patient is [name], MD  LOS - 13  Outpatient Specialists: cardiology  No chief complaint on file.      Brief Narrative 78 year old female with history of hypertension, diabetes mellitus, coronary artery disease with chronic systolic CHF (EF of 70-01%), A. fib on anticoagulation and depression presented to the ED with increasing weakness.  Patient has started eating and drinking poorly since 10 days prior to admission after her daughter died from CHF.  Patient presented with hyperglycemia, hypertension, acute on chronic kidney injury and lactic acidosis. On the day following admission she was found to be increasingly lethargic with worsened acidosis (bicarb of 11 and pH of 7.2).  PCCM was consulted who recommended medical management.  She was started on bicarb drip.  Her hypertension and acute kidney injury was secondary to hypovolemia and dehydration with poor p.o. intake. Hospital course now prolonged with A. fib with RVR and pulmonary edema   Subjective:   Appears in better mood today.  Heart rate stable and diuresing quite well. Per son she was quite confused last night and called 911 asking for help. The police went to her house and called the son if everything was ok. Patient doesnot remember anything.   Assessment  & Plan :   Principal problem Acute on chronic systolic CHF (HCC) Continue torsemide 40 mg daily.  Excellent diuresis on past 48 hrs ( almost 6 L). Not on ACE inhibitors/ARB due to her  AKI.    If renal function and blood pressure continues to remain stable possibly add low-dose. Continue metoprolol. Cardiology signed off.  Active problems Persistent A. fib with RVR CHA2DS2/VASC of 6.  On Eliquis.  Currently on amiodarone, cardiology recommend stopping this if son does not want cardioversion.  Upon discussing with him he is concerned the further risk on her health from cardioversion and doesnot want to pursue it unless her heart and  physical condition was stronger. I will d/c her amiodarone.  She was cardioverted in March with return to A. fib 2 months later when she got admitted.  Was also given digoxin initially, now discontinued.  Metoprolol dose increased to 50 mg for better rate control (100 mg daily).  Getting lower dose due to hypotension on presentation.   PVCs and NSVT. Low potassium and magnesium  replenished.    Continue metoprolol at current dose.  Acute on chronic kidney disease stage III (HCC) Baseline creatinine of 1.8-2.  Suspect secondary to hypovolemia and hypotension.  Received IV fluids.  Creatinine was 4.8 on admission, now improved to baseline.   Acute metabolic encephalopathy/hepatic encephalopathy Noted for elevated ammonia of 90 and received lactulose.  Also contributed by hypotension and dehydration.  Encephalopathy has resolved but patient seems depressed and anxious.     Anxiety and major depression. Patient is significantly depressed and withdrawn since her daughter's death few weeks back.  She is already on Remeron, buspirone and Celexa without much improvement. Psych consulted and appreciate recommendation.  No imminent risk to self or others.  Discontinued Remeron given side effects of fluid retention.  Continue Celexa at current dose and have increased buspirone to 7.5 mg twice daily.  Psych recommends dose can be increased up to 10 mg if no improvement seen.  Confusion and sundowning  avoid benzos at night  Acute metabolic  acidosis Possibly secondary to ATN.  Resolved with bicarb drip.  Acute respiratory failure with hypoxia Secondary to fluid overload Continues to improve with diuresis.  Hypoglycemia Secondary to poor p.o. intake.  Now resolved  Hypothyroidism Continue Synthroid.  Levels normal.  Acute gouty arthritis (right big toe). Improved with 3-day course of prednisone.  Hyperkalemia Received bicarbonate Lokelma.  Now resolved.  Hypomagnesemia Replenished  Transaminitis Possibly shock liver.  Now resolving.  Coag negative blood culture Suspect contaminant.  Received  5 days of antibiotic.   Goals of care PT recommends SNF. After multiple conversations, pt and son agree on short term rehab, Palliative care involved as well. Son wants full scope of treatment for now but doesnot want to prolong ventilation or aggressive care if patient not improving after initial resuscitation.   referral sent to SNF. Obtain updated COVID 19 test today   Code Status : Full code   Family Communication  : Discussed with son at bedside  Disposition Plan  : Possibly SNF tomorrow if COVID 19 result available and negative   Barriers For Discharge :improving symptoms  Consults  : Cardiology, palliative care  Procedures  : 2D echo  DVT Prophylaxis  : Eliquis  Lab Results  Component Value Date   PLT 152 01/04/2019    Antibiotics  :    Anti-infectives (From admission, onward)   Start     Dose/Rate Route Frequency Ordered Stop   12/30/18 2200  ceFAZolin (ANCEF) IVPB 1 g/50 mL premix  Status:  Discontinued     1 g 100 mL/hr over 30 Minutes Intravenous Every 12 hours 12/30/18 1318 01/02/19 1301   12/29/18 1400  piperacillin-tazobactam (ZOSYN) IVPB 2.25 g  Status:  Discontinued     2.25 g 100 mL/hr over 30 Minutes Intravenous Every 8 hours 12/29/18 1316 12/30/18 1309   12/29/18 1309  vancomycin variable dose per unstable renal function (pharmacist dosing)  Status:  Discontinued      Does not  apply See admin instructions 12/29/18 1309 12/30/18 1308   12/29/18 1200  ceFEPIme (MAXIPIME) 1 g in sodium chloride 0.9 %  100 mL IVPB  Status:  Discontinued     1 g 200 mL/hr over 30 Minutes Intravenous Every 24 hours 12/28/18 1242 12/29/18 1233   12/28/18 1245  ceFEPIme (MAXIPIME) 2 g in sodium chloride 0.9 % 100 mL IVPB     2 g 200 mL/hr over 30 Minutes Intravenous  Once 12/28/18 1233 12/28/18 1333   12/28/18 1245  metroNIDAZOLE (FLAGYL) IVPB 500 mg     500 mg 100 mL/hr over 60 Minutes Intravenous  Once 12/28/18 1233 12/28/18 1403   12/28/18 1245  vancomycin (VANCOCIN) IVPB 1000 mg/200 mL premix     1,000 mg 200 mL/hr over 60 Minutes Intravenous  Once 12/28/18 1233 12/28/18 1402        Objective:   Vitals:   01/09/19 1820 01/09/19 1954 01/10/19 0632 01/10/19 0826  BP: 124/79 107/77  (!) 119/95  Pulse: 87 88  98  Resp:  (!) 22  16  Temp:  (!) 97.3 F (36.3 C)  (!) 97.5 F (36.4 C)  TempSrc:  Oral  Oral  SpO2:  100%  98%  Weight:   95.5 kg   Height:        Wt Readings from Last 3 Encounters:  01/10/19 95.5 kg  12/01/18 88.5 kg  11/01/18 88.5 kg     Intake/Output Summary (Last 24 hours) at 01/10/2019 1400 Last data filed at 01/10/2019 4097 Gross per 24 hour  Intake 960 ml  Output 1750 ml  Net -790 ml   Physical exam: NAD, better mood HEENT; moist mucosa  chest : clear  CVS: S1&S2 irregular,  GI: soft, NT, ND Musculoskeletal: warm 1+ edema , much better since past few days       Data Review:    CBC Recent Labs  Lab 01/04/19 0736  WBC 11.6*  HGB 14.4  HCT 45.3  PLT 152  MCV 95.8  MCH 30.4  MCHC 31.8  RDW 16.0*    Chemistries  Recent Labs  Lab 01/04/19 0735 01/06/19 0938 01/06/19 1655 01/07/19 0238 01/08/19 1434 01/09/19 0545 01/10/19 0620  NA 135 137  --  136 136 137 137  K 4.5 4.4  --  3.4* 3.1* 3.2* 3.8  CL 98 96*  --  96* 94* 93* 92*  CO2 27 27  --  30 31 34* 33*  GLUCOSE 138* 100*  --  87 136* 78 105*  BUN 36* 38*  --  36* 30*  29* 25*  CREATININE 1.65* 1.71*  --  1.67* 1.50* 1.46* 1.26*  CALCIUM 8.6* 8.4*  --  7.7* 8.1* 7.8* 8.2*  MG 1.7 1.6*  --   --  1.4* 1.3* 1.8  AST  --   --  51*  --   --   --   --   ALT  --   --  40  --   --   --   --   ALKPHOS  --   --  168*  --   --   --   --   BILITOT  --   --  1.6*  --   --   --   --    ------------------------------------------------------------------------------------------------------------------ No results for input(s): CHOL, HDL, LDLCALC, TRIG, CHOLHDL, LDLDIRECT in the last 72 hours.  Lab Results  Component Value Date   HGBA1C 5.4 06/07/2018   ------------------------------------------------------------------------------------------------------------------ No results for input(s): TSH, T4TOTAL, T3FREE, THYROIDAB in the last 72 hours.  Invalid input(s): FREET3 ------------------------------------------------------------------------------------------------------------------ No results for input(s): VITAMINB12, FOLATE, FERRITIN, TIBC, IRON, RETICCTPCT  in the last 72 hours.  Coagulation profile No results for input(s): INR, PROTIME in the last 168 hours.  No results for input(s): DDIMER in the last 72 hours.  Cardiac Enzymes No results for input(s): CKMB, TROPONINI, MYOGLOBIN in the last 168 hours.  Invalid input(s): CK ------------------------------------------------------------------------------------------------------------------    Component Value Date/Time   BNP 1,262.8 (H) 10/13/2018 0724   BNP 73.4 03/26/2016 1525    Inpatient Medications  Scheduled Meds:  allopurinol  100 mg Oral Daily   amiodarone  200 mg Oral Daily   apixaban  5 mg Oral BID   busPIRone  7.5 mg Oral BID   citalopram  20 mg Oral Daily   colchicine  0.6 mg Oral Daily   feeding supplement  1 Container Oral TID BM   Gerhardt's butt cream   Topical TID   insulin aspart  0-9 Units Subcutaneous TID WC   levothyroxine  100 mcg Oral Q0600   magnesium oxide  200 mg  Oral BID   metoprolol succinate  50 mg Oral Daily   torsemide  40 mg Oral Daily   Continuous Infusions:  PRN Meds:.clonazePAM, HYDROcodone-acetaminophen, lip balm, metoprolol tartrate, polyethylene glycol, sodium chloride flush  Micro Results No results found for this or any previous visit (from the past 240 hour(s)).  Radiology Reports Ct Abdomen Wo Contrast  Result Date: 12/28/2018 CLINICAL DATA:  Elevated liver function tests. Nausea vomiting. EXAM: CT ABDOMEN W ITHOUT CONTRAST TECHNIQUE: Multidetector CT imaging of the abdomen was performed following the standard protocol without IV contrast. COMPARISON:  August 30, 2015 FINDINGS: Lower chest: Partially visualized area of scarring/atelectasis in the lingula. Atelectasis versus airspace consolidation in the dependent portion of the left lower lobe. Enlarged heart. Minimal pericardial thickening versus tiny effusion. Calcific atherosclerotic disease of the aorta and coronary arteries, mild. Hepatobiliary: Normal appearance of the liver. The gallbladder is not abnormally distended but demonstrates a hyperdense appearance. Pancreas: Fatty replaced pancreas. Spleen: Normal in size without focal abnormality. Adrenals/Urinary Tract: Mild left adrenal thickening. Normal nonenhanced appearance of the kidneys. Stomach/Bowel: Stomach is within normal limits. No evidence of bowel wall thickening, distention, or inflammatory changes. Vascular/Lymphatic: Aortic atherosclerosis. No enlarged abdominal lymph nodes. Other: No abdominal wall hernia or abnormality. Musculoskeletal: Spondylosis the lumbosacral spine. IMPRESSION: 1. Unusual hyperdense appearance of the gallbladder. If the patient has had recent administration of IV contrast, this may represent vicarious excretion of contrast. Otherwise MRCP or ERCP may be considered for further investigation. 2. Enlarged heart. Minimal pericardial thickening versus tiny effusion. 3. Partially visualized area of  scarring/atelectasis in the lingula. 4. Atelectasis versus airspace consolidation in the dependent portion of the left lower lobe. 5. Calcific atherosclerotic disease of the aorta and coronary arteries. Electronically Signed   By: Fidela Salisbury M.D.   On: 12/28/2018 14:22   US Renal  Result Date: 12/31/2018 CLINICAL DATA:  Acute renal insufficiency. EXAM: RENAL / URINARY TRACT ULTRASOUND COMPLETE COMPARISON:  CT 12/28/2018 FINDINGS: Right Kidney: Renal measurements: 10.9 x 4.1 x 4.0 cm = volume: 92 mL . Echogenicity within normal limits. No mass or hydronephrosis visualized. 4 mm indeterminate hyperechoic focus over the mid pole cortex which is not seen on recent noncontrast CT. Left Kidney: Renal measurements: 8.9 x 4.5 x 5.0 cm = volume: 104 mL. Echogenicity within normal limits. No mass or hydronephrosis visualized. Bladder: Appears normal for degree of bladder distention. IMPRESSION: Normal size kidneys without hydronephrosis. Indeterminate 4 mm hyperechoic focus over the mid pole cortex of the right kidney which is  not seen on recent noncontrast CT. Recommend follow-up ultrasound 6 months. Electronically Signed   By: Marin Olp M.D.   On: 12/31/2018 16:37   Dg Chest Port 1 View  Result Date: 01/04/2019 CLINICAL DATA:  Increased shortness of breath tonight. History of CHF. EXAM: PORTABLE CHEST 1 VIEW COMPARISON:  Radiograph 01/01/2019 FINDINGS: Cardiomegaly, unchanged. Aortic atherosclerosis. Progressive peribronchial thickening suspicious for pulmonary edema. Hazy bibasilar opacities consistent with pleural effusions. No pneumothorax. IMPRESSION: Development of pulmonary edema and bilateral pleural effusions consistent with CHF. Cardiomegaly is unchanged. Electronically Signed   By: Keith Rake M.D.   On: 01/04/2019 21:53   Dg Chest Port 1 View  Result Date: 01/01/2019 CLINICAL DATA:  Hypoxemia, shortness of breath with weakness today. EXAM: PORTABLE CHEST 1 VIEW COMPARISON:  Chest  x-rays dated 12/28/2018 and 10/15/2018. FINDINGS: Chronic opacity at the LEFT lung base presumed scarring/atelectasis based on recent CT abdomen of 12/28/2018. No new lung findings. No pneumothorax seen. Stable cardiomegaly. IMPRESSION: Stable chest x-ray. No evidence of acute cardiopulmonary abnormality. No evidence of pneumonia or pulmonary edema. Electronically Signed   By: Franki Cabot M.D.   On: 01/01/2019 08:13   Dg Chest Port 1 View  Result Date: 12/28/2018 CLINICAL DATA:  Generalized weakness for 3 days. EXAM: PORTABLE CHEST 1 VIEW COMPARISON:  10/15/2018 FINDINGS: The heart is enlarged but stable. There is tortuosity and calcification of the thoracic aorta. The lungs are grossly clear. No infiltrates, edema or effusions. The bony thorax is intact. IMPRESSION: Stable cardiac enlargement but no acute pulmonary findings. Electronically Signed   By: Marijo Sanes M.D.   On: 12/28/2018 10:40   US Abdomen Limited Ruq  Result Date: 12/29/2018 CLINICAL DATA:  Abnormal transaminase level. EXAM: ULTRASOUND ABDOMEN LIMITED RIGHT UPPER QUADRANT COMPARISON:  CT scan of December 28, 2018. FINDINGS: Gallbladder: No gallstones or wall thickening visualized. No sonographic Murphy sign noted by sonographer. Common bile duct: Diameter: 4 mm which is within normal limits. Liver: No focal lesion identified. Increased echogenicity of hepatic parenchyma is noted suggesting hepatic steatosis or possibly other diffuse hepatocellular disease. Portal vein is patent on color Doppler imaging with normal direction of blood flow towards the liver. IMPRESSION: Mildly increased echogenicity of hepatic parenchyma is noted suggesting hepatic steatosis or other diffuse hepatocellular disease. No other abnormality seen in the right upper quadrant of the abdomen. Electronically Signed   By: Marijo Conception M.D.   On: 12/29/2018 12:08    Time Spent in minutes 25   Lindbergh Winkles M.D on 01/10/2019 at 2:00 PM  Between 7am to 7pm -  Pager - [phone]  After 7pm go to [email or web address] - password [facility]  [facility] -  Office  [phone]

## 2019-01-10 NOTE — Progress Notes (Signed)
Pt hasn't called out for staff since approx 0330

## 2019-01-10 NOTE — Progress Notes (Signed)
Talked with son [name] giving him an update on mom's condition. Informed him I gave her something to help her sleep and relax her legs as she stated they were bothering her/restless. Explained I gave her Klonopin and hopefully that will help her relax enough so she can sleep

## 2019-01-10 NOTE — Progress Notes (Signed)
Pt has been calling out all night about every 5-3min for the same thing "how do I go to sleep?" pt also c/o being cold and then 10 min later she is too hot. Pt restless and "needy." Earlier tonight notified by son that pt had been calling police telling them she couldn't go to sleep. The police called him to check on things. Pt is constantly wanting somebody in the room. Klonopin given earlier had no effect. Pt is A&O x4

## 2019-01-10 NOTE — Care Management Important Message (Signed)
Important Message  Patient Details  Name: Teresa [name] MRN: [id number] Date of Birth: 1941/03/15   Medicare Important Message Given:  Yes     [name] 01/10/2019, 2:28 PM

## 2019-01-10 NOTE — Progress Notes (Signed)
Occupational Therapy Treatment Patient Details Name: Teresa [name] MRN: [id number] DOB: May 06, 1941 Today's Date: 01/10/2019    History of present illness Pt adm with weakness, hypotension, acute on chronic renal failure. 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 PMH - afib, chf, anxiety, dm, cad,    OT comments  Pt having recently returned to bed and reports increased fatigue from sitting in recliner chair for several hours this morning. Pt agreeable to B LE/UE exercise in all planes of movement x 10 reps. Pt needing multiple rest breaks and cuing for pursed lip breathing. Pt remained in bed at end of session secondary to fatigue. Pt continues to benefit from OT intervention.    Follow Up Recommendations  SNF;Home health OT;Supervision/Assistance - 24 hour    Equipment Recommendations  None recommended by OT    Recommendations for Other Services      Precautions / Restrictions Precautions Precautions: Fall Restrictions Weight Bearing Restrictions: No       Mobility Bed Mobility Overal bed mobility: Needs Assistance Bed Mobility: Supine to Sit     Supine to sit: Mod assist;HOB elevated     General bed mobility comments: Assist to bring legs off of bed, elevate trunk into sitting and bring hips to EOB  Transfers Overall transfer level: Needs assistance Equipment used: Rolling walker (2 wheeled);Ambulation equipment used Transfers: Sit to/from Stand Sit to Stand: Mod assist;+2 physical assistance;Min assist         General transfer comment: Assist to bring hips up and for balance. +2 min from higher surface of bed and with Stedy and +2 mod from lower surface of recliner with walker. Used Stedy for bed to chair.     Balance Overall balance assessment: Needs assistance Sitting-balance support: No upper extremity supported;Feet supported Sitting balance-Leahy Scale: Fair     Standing balance support: Bilateral upper extremity supported Standing balance-Leahy  Scale: Poor Standing balance comment: walker and min assist for static standing        ADL either performed or assessed with clinical judgement        Vision Baseline Vision/History: Wears glasses Wears Glasses: At all times Patient Visual Report: No change from baseline            Cognition Arousal/Alertness: Awake/alert Behavior During Therapy: WFL for tasks assessed/performed Overall Cognitive Status: No family/caregiver present to determine baseline cognitive functioning Area of Impairment: Problem solving          Following Commands: Follows multi-step commands with increased time;Follows multi-step commands consistently Safety/Judgement: Decreased awareness of safety;Decreased awareness of deficits   Problem Solving: Slow processing;Requires verbal cues                     Pertinent Vitals/ Pain       Pain Assessment: Faces Faces Pain Scale: No hurt Pain Location: rt foot Pain Descriptors / Indicators: Sore;Grimacing;Guarding Pain Intervention(s): Limited activity within patient's tolerance         Frequency  Min 2X/week        Progress Toward Goals  OT Goals(current goals can now be found in the care plan section)  Progress towards OT goals: Progressing toward goals  Acute Rehab OT Goals Patient Stated Goal: no goal stated  Plan Discharge plan remains appropriate       AM-PAC OT "6 Clicks" Daily Activity     Outcome Measure     Help from another person taking care of personal grooming?: A Little Help from another person toileting, which  includes using toliet, bedpan, or urinal?: Total Help from another person bathing (including washing, rinsing, drying)?: A Lot Help from another person to put on and taking off regular upper body clothing?: A Little Help from another person to put on and taking off regular lower body clothing?: Total 6 Click Score: 10    End of Session Equipment Utilized During Treatment: Oxygen  OT Visit Diagnosis:  Unsteadiness on feet (R26.81);Other abnormalities of gait and mobility (R26.89);Muscle weakness (generalized) (M62.81);Feeding difficulties (R63.3);Other symptoms and signs involving cognitive function   Activity Tolerance Patient tolerated treatment well   Patient Left with call bell/phone within reach;with chair alarm set;in bed   Nurse Communication Mobility status        Time: 1411-1431 OT Time Calculation (min): 20 min  Charges: OT General Charges $OT Visit: 1 Visit OT Treatments $Therapeutic Exercise: 8-22 mins   Tykee Heideman P, MS, OTR/L 01/10/2019, 2:47 PM

## 2019-01-10 NOTE — Progress Notes (Signed)
Physical Therapy Treatment Patient Details Name: Teresa [name] MRN: [id number] DOB: 26-May-1941 Today's Date: 01/10/2019    History of Present Illness Pt adm with weakness, hypotension, acute on chronic renal failure. 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 PMH - afib, chf, anxiety, dm, cad,     PT Comments    Pt making slow, steady progress. Continues to need 2 people for mobility. Family unable to provide 24 hour care at home. Recommend ST-SNF.    Follow Up Recommendations  Supervision/Assistance - 24 hour;SNF(Family unable to provide ample support at home)     Equipment Recommendations  Wheelchair (measurements PT);Wheelchair cushion (measurements PT);Hospital bed    Recommendations for Other Services       Precautions / Restrictions Precautions Precautions: Fall Restrictions Weight Bearing Restrictions: No    Mobility  Bed Mobility Overal bed mobility: Needs Assistance Bed Mobility: Supine to Sit     Supine to sit: Mod assist;HOB elevated     General bed mobility comments: Assist to bring legs off of bed, elevate trunk into sitting and bring hips to EOB  Transfers Overall transfer level: Needs assistance Equipment used: Rolling walker (2 wheeled);Ambulation equipment used Transfers: Sit to/from Stand Sit to Stand: Mod assist;+2 physical assistance;Min assist         General transfer comment: Assist to bring hips up and for balance. +2 min from higher surface of bed and with Stedy and +2 mod from lower surface of recliner with walker. Used Stedy for bed to chair.   Ambulation/Gait Ambulation/Gait assistance: Mod assist;+2 safety/equipment Gait Distance (Feet): 3 Feet Assistive device: Rolling walker (2 wheeled) Gait Pattern/deviations: Step-to pattern;Decreased step length - right;Decreased step length - left;Shuffle;Trunk flexed Gait velocity: decr Gait velocity interpretation: <1.31 ft/sec, indicative of household ambulator General Gait Details:  Assist for balance and support. Pt fatigued quickly and unable to advance feet any further.   Stairs             Wheelchair Mobility    Modified Rankin (Stroke Patients Only)       Balance Overall balance assessment: Needs assistance Sitting-balance support: No upper extremity supported;Feet supported Sitting balance-Leahy Scale: Fair     Standing balance support: Bilateral upper extremity supported Standing balance-Leahy Scale: Poor Standing balance comment: walker and min assist for static standing                            Cognition Arousal/Alertness: Awake/alert Behavior During Therapy: WFL for tasks assessed/performed Overall Cognitive Status: No family/caregiver present to determine baseline cognitive functioning Area of Impairment: Problem solving                             Problem Solving: Slow processing;Requires verbal cues        Exercises      General Comments        Pertinent Vitals/Pain Pain Assessment: Faces Faces Pain Scale: Hurts a little bit Pain Location: rt foot Pain Descriptors / Indicators: Sore;Grimacing;Guarding Pain Intervention(s): Limited activity within patient's tolerance    Home Living                      Prior Function            PT Goals (current goals can now be found in the care plan section) Progress towards PT goals: Progressing toward goals(very slowly)    Frequency  Min 3X/week      PT Plan Current plan remains appropriate    Co-evaluation              AM-PAC PT "6 Clicks" Mobility   Outcome Measure  Help needed turning from your back to your side while in a flat bed without using bedrails?: Total Help needed moving from lying on your back to sitting on the side of a flat bed without using bedrails?: A Lot Help needed moving to and from a bed to a chair (including a wheelchair)?: A Lot Help needed standing up from a chair using your arms (e.g., wheelchair  or bedside chair)?: A Lot Help needed to walk in hospital room?: A Lot Help needed climbing 3-5 steps with a railing? : Total 6 Click Score: 10    End of Session Equipment Utilized During Treatment: Gait belt;Oxygen Activity Tolerance: Patient limited by fatigue Patient left: with call bell/phone within reach;in chair;with chair alarm set Nurse Communication: Mobility status;Need for lift equipment PT Visit Diagnosis: Other abnormalities of gait and mobility (R26.89);Pain Pain - Right/Left: Right Pain - part of body: Ankle and joints of foot     Time: 9470-9628 PT Time Calculation (min) (ACUTE ONLY): 17 min  Charges:  $Therapeutic Activity: 8-22 mins                     [place] Pager [phone] Office [place] 01/10/2019, 1:38 PM

## 2019-01-10 NOTE — Telephone Encounter (Signed)
12/28/2018 Received signed FMLA Form from [employer], I inter- office it back to [place] to finishing processing. cbr

## 2019-01-11 DIAGNOSIS — N183 Chronic kidney disease, stage 3 (moderate): Secondary | ICD-10-CM

## 2019-01-11 DIAGNOSIS — R5383 Other fatigue: Secondary | ICD-10-CM

## 2019-01-11 DIAGNOSIS — Z515 Encounter for palliative care: Secondary | ICD-10-CM

## 2019-01-11 DIAGNOSIS — R5381 Other malaise: Secondary | ICD-10-CM | POA: Diagnosis present

## 2019-01-11 DIAGNOSIS — F329 Major depressive disorder, single episode, unspecified: Secondary | ICD-10-CM | POA: Diagnosis present

## 2019-01-11 LAB — BASIC METABOLIC PANEL
Anion gap: 11 (ref 5–15)
BUN: 24 mg/dL — ABNORMAL HIGH (ref 8–23)
CO2: 35 mmol/L — ABNORMAL HIGH (ref 22–32)
Calcium: 8.2 mg/dL — ABNORMAL LOW (ref 8.9–10.3)
Chloride: 90 mmol/L — ABNORMAL LOW (ref 98–111)
Creatinine, Ser: 1.12 mg/dL — ABNORMAL HIGH (ref 0.44–1.00)
GFR calc Af Amer: 54 mL/min — ABNORMAL LOW (ref >60)
GFR calc non Af Amer: 47 mL/min — ABNORMAL LOW (ref >60)
Glucose, Bld: 103 mg/dL — ABNORMAL HIGH (ref 70–99)
Potassium: 3.3 mmol/L — ABNORMAL LOW (ref 3.5–5.1)
Sodium: 136 mmol/L (ref 135–145)

## 2019-01-11 LAB — NOVEL CORONAVIRUS, NAA (HOSP ORDER, SEND-OUT TO REF LAB; TAT 18-24 HRS): SARS-CoV-2, NAA: NOT DETECTED

## 2019-01-11 LAB — GLUCOSE, CAPILLARY
Glucose-Capillary: 114 mg/dL — ABNORMAL HIGH (ref 70–99)
Glucose-Capillary: 141 mg/dL — ABNORMAL HIGH (ref 70–99)
Glucose-Capillary: 135 mg/dL — ABNORMAL HIGH (ref 70–99)
Glucose-Capillary: 157 mg/dL — ABNORMAL HIGH (ref 70–99)

## 2019-01-11 MED ORDER — POLYETHYLENE GLYCOL 3350 17 G PO PACK: 17.0000 g | PACK | Freq: Every day | ORAL | 0 refills | Status: DC | PRN

## 2019-01-11 MED ORDER — POTASSIUM CHLORIDE CRYS ER 20 MEQ PO TBCR
40.0000 meq | EXTENDED_RELEASE_TABLET | Freq: Once | ORAL | Status: AC
  Administered 2019-01-11: 40 meq via ORAL
  Filled 2019-01-11: qty 2

## 2019-01-11 MED ORDER — HYDROCODONE-ACETAMINOPHEN 5-325 MG PO TABS: 1.0000 | ORAL_TABLET | Freq: Four times a day (QID) | ORAL | 0 refills | Status: DC | PRN

## 2019-01-11 MED ORDER — CLONAZEPAM 0.25 MG PO TBDP: 0.2500 mg | ORAL_TABLET | Freq: Two times a day (BID) | ORAL | 0 refills | Status: DC | PRN

## 2019-01-11 MED ORDER — MELATONIN 3 MG PO TABS: 3.0000 mg | ORAL_TABLET | Freq: Every evening | ORAL | 0 refills | Status: DC | PRN

## 2019-01-11 MED ORDER — BUSPIRONE HCL 7.5 MG PO TABS: 7.5000 mg | ORAL_TABLET | Freq: Two times a day (BID) | ORAL | 0 refills | Status: DC

## 2019-01-11 MED ORDER — METOPROLOL SUCCINATE ER 50 MG PO TB24: 50.0000 mg | ORAL_TABLET | Freq: Every day | ORAL | 0 refills | Status: DC

## 2019-01-11 MED ORDER — MAGNESIUM OXIDE 400 (241.3 MG) MG PO TABS: 200.0000 mg | ORAL_TABLET | Freq: Two times a day (BID) | ORAL | 0 refills | Status: DC

## 2019-01-11 NOTE — Progress Notes (Signed)
Text sent to MD for a CDif order per protocol. Pt has been having at least 4 watery to mushy stools per shift for a couple of days.

## 2019-01-11 NOTE — Progress Notes (Signed)
Pt back awake at this time. Called for a drink of water. Explained the water was on her table next to her. Given a drink of water

## 2019-01-11 NOTE — Plan of Care (Signed)
  Problem: Clinical Measurements: Goal: Will remain free from infection Outcome: Progressing Goal: Respiratory complications will improve Outcome: Progressing Goal: Cardiovascular complication will be avoided Outcome: Progressing   Problem: Health Behavior/Discharge Planning: Goal: Ability to manage health-related needs will improve Outcome: Not Progressing   

## 2019-01-11 NOTE — Progress Notes (Signed)
Pt hasn't called out for staff since 0015. Usually if she's not calling out every 5-10 min she's asleep. Checked on pt at this time and she's asleep

## 2019-01-11 NOTE — Discharge Summary (Signed)
Physician Discharge Summary  Teresa [name] SAY:[id number] DOB: 10/27/1940 DOA: 12/28/2018  PCP: [name], MD  Admit date: 12/28/2018 Discharge date: 01/11/2019  Admitted From: Home Disposition: Skilled nursing facility  Recommendations for Outpatient Follow-up:  1. Follow up with MD at SNF in 1 week. 2. Check renal function in 1 week and if stable please add low-dose ACE inhibitor or ARB.  Home Health: None Equipment/Devices: 2 L via nasal cannula, wean as tolerated  Discharge Condition: Fair CODE STATUS: Full code Diet recommendation: Heart Healthy / Carb Modified     Discharge Diagnoses:  Principal Problem:   Hypotension due to hypovolemia   Active Problems:     Generalized anxiety disorder   Malaise and fatigue   Essential (primary) hypertension Acute on chronic systolic CHF (congestive heart failure), NYHA class 2 (HCC)   Moderate mitral regurgitation   Diabetes mellitus type 2, diet-controlled (HCC)   Hypothyroidism   Anxiety and depression   Acute renal failure (ARF) (HCC)   Hypokalemia   Atrial fibrillation with RVR (HCC)   Abnormal transaminases   Goals of care, counseling/discussion   Adjustment disorder with mixed anxiety and depressed mood   Palliative care patient   Physical deconditioning   Brief narrative/HPI 78 year old female with history of hypertension, diabetes mellitus, coronary artery disease with chronic systolic CHF (EF of 23-55%), A. fib on anticoagulation and depression presented to the ED with increasing weakness.  Patient has started eating and drinking poorly since 10 days prior to admission after her daughter died from CHF.  Patient presented with hyperglycemia, hypertension, acute on chronic kidney injury and lactic acidosis. On the day following admission she was found to be increasingly lethargic with worsened acidosis (bicarb of 11 and pH of 7.2).  PCCM was consulted who recommended medical management.  She was started on bicarb  drip.  Her hypertension and acute kidney injury was secondary to hypovolemia and dehydration with poor p.o. intake. Hospital course now prolonged with A. fib with RVR and pulmonary edema.  Hospital course   Principal problem Acute on chronic systolic CHF (HCC) Continue torsemide 40 mg daily.  Excellent diuresis on past 72 hours with  almost 9 L urine output.  Not on ACE inhibitors/ARB due to her AKI.    If renal function and blood pressure continues to remain stable, please add low-dose ACE inhibitor or ARB as outpatient. Continue metoprolol at a lower dose.  Cardiology signed off.  Active problems Permanent A. fib with RVR CHA2DS2/VASC of 6.  On Eliquis.    Was placed on amiodarone.  Son not interested in cardioversion given high risk of complications and her underlying comorbidities.  Discontinue amiodarone as per cardiology recommendation.  She was cardioverted in March with return to A. fib 2 months later when she got admitted.  Was also given digoxin initially, now discontinued.  Resume metoprolol at a lower dose 50 mg daily given severe hypotension on presentation. Now rate well controlled.  Continue metoprolol and Eliquis.  Outpatient follow-up with cardiology.  PVCs and NSVT. Low potassium and magnesium  replenished.    Continue metoprolol at current dose.  Acute on chronic kidney disease stage III (HCC) Baseline creatinine of 1.8-2.  Suspect secondary to hypovolemia and hypotension.    Renal function improved to baseline with IV fluids.   Acute metabolic encephalopathy/hepatic encephalopathy Noted for elevated ammonia of 90 and received lactulose.  Also contributed by hypotension and dehydration.  Encephalopathy has resolved but patient seems depressed and anxious.  Anxiety and major depression. Patient is significantly depressed and withdrawn since her daughter's death few weeks back.  She is already on Remeron, buspirone and Celexa without much improvement. Psych  consulted and appreciate recommendation.  No imminent risk to self or others.  Discontinued Remeron given side effects of fluid retention.  Continue Celexa at current dose and have increased buspirone to 7.5 mg twice daily.  Psych recommends dose can be increased up to 10 mg if no improvement seen.  Confusion and sundowning  avoid benzos at night.  Nighttime melatonin  Acute metabolic acidosis Possibly secondary to ATN.  Resolved with bicarb drip.  Acute respiratory failure with hypoxia Secondary to fluid overload Continues to improve with diuresis.  Currently on 2 L via nasal cannula, wean as tolerated.  Hypoglycemia Secondary to poor p.o. intake.  Now resolved  Hypothyroidism Continue Synthroid.  Levels normal.  Acute gouty arthritis (right big toe). Improved with 3-day course of prednisone.  Hypokalemia and hypomagnesemia Replenished  Transaminitis Possibly shock liver.  Now resolved  Coag negative blood culture Suspect contaminant.  Received  5 days of antibiotic.   Goals of care PT recommends SNF. After multiple conversations, pt and son agree on short term rehab, Palliative care involved as well. Son wants full scope of treatment for now but doesnot want to prolong ventilation or aggressive care if patient not improving after initial resuscitation.   Discharge to SNF once updated COVID-19 results available and negative.     Family Communication  : Discussed with son at bedside  Disposition Plan  :  SNF     Consults  : Cardiology, palliative care  Procedures  : 2D echo    Discharge Instructions   Allergies as of 01/11/2019      Reactions   Ambien [zolpidem Tartrate] Other (See Comments)     Severe lethargy confusion and restlessness   Aspirin Other (See Comments)   HX Bleeding Ulcer    Nsaids Other (See Comments)   Hx of bleeding ulcers   Tolmetin Rash   Hx of bleeding ulcers      Medication List    STOP taking these  medications   amiodarone 200 MG tablet Commonly known as: PACERONE   doxylamine (Sleep) 25 MG tablet Commonly known as: UNISOM   hydrOXYzine 10 MG tablet Commonly known as: ATARAX/VISTARIL   mirtazapine 7.5 MG tablet Commonly known as: REMERON     TAKE these medications   AeroChamber MV inhaler Use as instructed   albuterol 108 (90 Base) MCG/ACT inhaler Commonly known as: ProAir HFA USE 2 PUFFS EVERY 6 HOURS AS NEEDED FOR SHORTNESS OF BREATH AND WHEEZING.   apixaban 5 MG Tabs tablet Commonly known as: ELIQUIS Take 1 tablet (5 mg total) by mouth 2 (two) times daily.   busPIRone 7.5 MG tablet Commonly known as: BUSPAR Take 1 tablet (7.5 mg total) by mouth 2 (two) times daily. What changed:   medication strength  how much to take   citalopram 20 MG tablet Commonly known as: CELEXA TAKE 1 TABLET EACH DAY. What changed: See the new instructions.   clonazePAM 0.25 MG disintegrating tablet Commonly known as: KLONOPIN Take 1 tablet (0.25 mg total) by mouth 2 (two) times daily as needed (anxiety . avoid during bedtime).   famotidine 20 MG tablet Commonly known as: PEPCID Take 1 tablet (20 mg total) by mouth at bedtime.   HYDROcodone-acetaminophen 5-325 MG tablet Commonly known as: NORCO/VICODIN Take 1 tablet by mouth every 6 (six) hours as needed  for moderate pain (back). What changed:   when to take this  reasons to take this   levothyroxine 100 MCG tablet Commonly known as: SYNTHROID TAKE 1 TABLET ONCE DAILY BEFORE BREAKFAST.   magnesium oxide 400 (241.3 Mg) MG tablet Commonly known as: MAG-OX Take 0.5 tablets (200 mg total) by mouth 2 (two) times daily.   Melatonin 3 MG Tabs Take 1 tablet (3 mg total) by mouth at bedtime as needed (PRN sleep).   metoprolol succinate 50 MG 24 hr tablet Commonly known as: TOPROL-XL Take 1 tablet (50 mg total) by mouth daily. Take with or immediately following a meal. Start taking on: January 12, 2019 What changed:    medication strength  how much to take   polyethylene glycol 17 g packet Commonly known as: MIRALAX / GLYCOLAX Take 17 g by mouth daily as needed for mild constipation.   potassium chloride 10 MEQ CR capsule Commonly known as: MICRO-K Take 1 capsule (10 mEq total) by mouth 2 (two) times daily.   torsemide 20 MG tablet Commonly known as: DEMADEX Take 2 tablets (40 mg total) by mouth daily.            Durable Medical Equipment  (From admission, onward)         Start     Ordered   01/04/19 1334  For home use only DME Hospital bed  Once    Question Answer Comment  Length of Need 6 Months   Head must be elevated greater than: 45 degrees   Bed type Semi-electric      01/04/19 1335         Follow-up Information    MD at SNF Follow up in 1 week(s).          Allergies  Allergen Reactions  . Ambien [Zolpidem Tartrate] Other (See Comments)      Severe lethargy confusion and restlessness  . Aspirin Other (See Comments)    HX Bleeding Ulcer   . Nsaids Other (See Comments)    Hx of bleeding ulcers  . Tolmetin Rash    Hx of bleeding ulcers      Procedures/Studies: Ct Abdomen Wo Contrast  Result Date: 12/28/2018 CLINICAL DATA:  Elevated liver function tests. Nausea vomiting. EXAM: CT ABDOMEN W ITHOUT CONTRAST TECHNIQUE: Multidetector CT imaging of the abdomen was performed following the standard protocol without IV contrast. COMPARISON:  August 30, 2015 FINDINGS: Lower chest: Partially visualized area of scarring/atelectasis in the lingula. Atelectasis versus airspace consolidation in the dependent portion of the left lower lobe. Enlarged heart. Minimal pericardial thickening versus tiny effusion. Calcific atherosclerotic disease of the aorta and coronary arteries, mild. Hepatobiliary: Normal appearance of the liver. The gallbladder is not abnormally distended but demonstrates a hyperdense appearance. Pancreas: Fatty replaced pancreas. Spleen: Normal in size without  focal abnormality. Adrenals/Urinary Tract: Mild left adrenal thickening. Normal nonenhanced appearance of the kidneys. Stomach/Bowel: Stomach is within normal limits. No evidence of bowel wall thickening, distention, or inflammatory changes. Vascular/Lymphatic: Aortic atherosclerosis. No enlarged abdominal lymph nodes. Other: No abdominal wall hernia or abnormality. Musculoskeletal: Spondylosis the lumbosacral spine. IMPRESSION: 1. Unusual hyperdense appearance of the gallbladder. If the patient has had recent administration of IV contrast, this may represent vicarious excretion of contrast. Otherwise MRCP or ERCP may be considered for further investigation. 2. Enlarged heart. Minimal pericardial thickening versus tiny effusion. 3. Partially visualized area of scarring/atelectasis in the lingula. 4. Atelectasis versus airspace consolidation in the dependent portion of the left lower lobe. 5. Calcific  atherosclerotic disease of the aorta and coronary arteries. Electronically Signed   By: Fidela Salisbury M.D.   On: 12/28/2018 14:22   US Renal  Result Date: 12/31/2018 CLINICAL DATA:  Acute renal insufficiency. EXAM: RENAL / URINARY TRACT ULTRASOUND COMPLETE COMPARISON:  CT 12/28/2018 FINDINGS: Right Kidney: Renal measurements: 10.9 x 4.1 x 4.0 cm = volume: 92 mL . Echogenicity within normal limits. No mass or hydronephrosis visualized. 4 mm indeterminate hyperechoic focus over the mid pole cortex which is not seen on recent noncontrast CT. Left Kidney: Renal measurements: 8.9 x 4.5 x 5.0 cm = volume: 104 mL. Echogenicity within normal limits. No mass or hydronephrosis visualized. Bladder: Appears normal for degree of bladder distention. IMPRESSION: Normal size kidneys without hydronephrosis. Indeterminate 4 mm hyperechoic focus over the mid pole cortex of the right kidney which is not seen on recent noncontrast CT. Recommend follow-up ultrasound 6 months. Electronically Signed   By: Marin Olp M.D.   On:  12/31/2018 16:37   Dg Chest Port 1 View  Result Date: 01/04/2019 CLINICAL DATA:  Increased shortness of breath tonight. History of CHF. EXAM: PORTABLE CHEST 1 VIEW COMPARISON:  Radiograph 01/01/2019 FINDINGS: Cardiomegaly, unchanged. Aortic atherosclerosis. Progressive peribronchial thickening suspicious for pulmonary edema. Hazy bibasilar opacities consistent with pleural effusions. No pneumothorax. IMPRESSION: Development of pulmonary edema and bilateral pleural effusions consistent with CHF. Cardiomegaly is unchanged. Electronically Signed   By: Keith Rake M.D.   On: 01/04/2019 21:53   Dg Chest Port 1 View  Result Date: 01/01/2019 CLINICAL DATA:  Hypoxemia, shortness of breath with weakness today. EXAM: PORTABLE CHEST 1 VIEW COMPARISON:  Chest x-rays dated 12/28/2018 and 10/15/2018. FINDINGS: Chronic opacity at the LEFT lung base presumed scarring/atelectasis based on recent CT abdomen of 12/28/2018. No new lung findings. No pneumothorax seen. Stable cardiomegaly. IMPRESSION: Stable chest x-ray. No evidence of acute cardiopulmonary abnormality. No evidence of pneumonia or pulmonary edema. Electronically Signed   By: Franki Cabot M.D.   On: 01/01/2019 08:13   Dg Chest Port 1 View  Result Date: 12/28/2018 CLINICAL DATA:  Generalized weakness for 3 days. EXAM: PORTABLE CHEST 1 VIEW COMPARISON:  10/15/2018 FINDINGS: The heart is enlarged but stable. There is tortuosity and calcification of the thoracic aorta. The lungs are grossly clear. No infiltrates, edema or effusions. The bony thorax is intact. IMPRESSION: Stable cardiac enlargement but no acute pulmonary findings. Electronically Signed   By: Marijo Sanes M.D.   On: 12/28/2018 10:40   US Abdomen Limited Ruq  Result Date: 12/29/2018 CLINICAL DATA:  Abnormal transaminase level. EXAM: ULTRASOUND ABDOMEN LIMITED RIGHT UPPER QUADRANT COMPARISON:  CT scan of December 28, 2018. FINDINGS: Gallbladder: No gallstones or wall thickening visualized. No  sonographic Murphy sign noted by sonographer. Common bile duct: Diameter: 4 mm which is within normal limits. Liver: No focal lesion identified. Increased echogenicity of hepatic parenchyma is noted suggesting hepatic steatosis or possibly other diffuse hepatocellular disease. Portal vein is patent on color Doppler imaging with normal direction of blood flow towards the liver. IMPRESSION: Mildly increased echogenicity of hepatic parenchyma is noted suggesting hepatic steatosis or other diffuse hepatocellular disease. No other abnormality seen in the right upper quadrant of the abdomen. Electronically Signed   By: Marijo Conception M.D.   On: 12/29/2018 12:08       Subjective: In better mood today.  No overnight events.  Discharge Exam: Vitals:   01/10/19 1946 01/11/19 0755  BP: 117/65 129/68  Pulse: 67 71  Resp: 20  Temp: (!) 97.5 F (36.4 C) 98.1 F (36.7 C)  SpO2: 100% 98%   Vitals:   01/10/19 1500 01/10/19 1946 01/11/19 0636 01/11/19 0755  BP: 104/71 117/65  129/68  Pulse: 80 67  71  Resp: 16 20    Temp: 97.7 F (36.5 C) (!) 97.5 F (36.4 C)  98.1 F (36.7 C)  TempSrc: Oral Oral  Oral  SpO2: 100% 100%  98%  Weight:   95.1 kg   Height:        General: Elderly female in no acute distress, flat affect HEENT: Moist mucosa, supple neck Chest: Clear bilaterally CVS: Normal S1 and S2, no murmurs GI: Soft, nondistended, nontender Musculoskeletal: Warm, trace edema bilaterally     The results of significant diagnostics from this hospitalization (including imaging, microbiology, ancillary and laboratory) are listed below for reference.     Microbiology: No results found for this or any previous visit (from the past 240 hour(s)).   Labs: BNP (last 3 results) Recent Labs    10/13/18 0724  BNP 4,098.1*   Basic Metabolic Panel: Recent Labs  Lab 01/06/19 0938 01/07/19 0238 01/08/19 1434 01/09/19 0545 01/10/19 0620 01/11/19 0535  NA 137 136 136 137 137 136  K 4.4  3.4* 3.1* 3.2* 3.8 3.3*  CL 96* 96* 94* 93* 92* 90*  CO2 27 30 31  34* 33* 35*  GLUCOSE 100* 87 136* 78 105* 103*  BUN 38* 36* 30* 29* 25* 24*  CREATININE 1.71* 1.67* 1.50* 1.46* 1.26* 1.12*  CALCIUM 8.4* 7.7* 8.1* 7.8* 8.2* 8.2*  MG 1.6*  --  1.4* 1.3* 1.8  --    Liver Function Tests: Recent Labs  Lab 01/06/19 1655  AST 51*  ALT 40  ALKPHOS 168*  BILITOT 1.6*  PROT 5.8*  ALBUMIN 2.7*   No results for input(s): LIPASE, AMYLASE in the last 168 hours. No results for input(s): AMMONIA in the last 168 hours. CBC: No results for input(s): WBC, NEUTROABS, HGB, HCT, MCV, PLT in the last 168 hours. Cardiac Enzymes: No results for input(s): CKTOTAL, CKMB, CKMBINDEX, TROPONINI in the last 168 hours. BNP: Invalid input(s): POCBNP CBG: Recent Labs  Lab 01/10/19 1132 01/10/19 1645 01/10/19 2205 01/11/19 0757 01/11/19 1108  GLUCAP 111* 115* 145* 114* 141*   D-Dimer No results for input(s): DDIMER in the last 72 hours. Hgb A1c No results for input(s): HGBA1C in the last 72 hours. Lipid Profile No results for input(s): CHOL, HDL, LDLCALC, TRIG, CHOLHDL, LDLDIRECT in the last 72 hours. Thyroid function studies No results for input(s): TSH, T4TOTAL, T3FREE, THYROIDAB in the last 72 hours.  Invalid input(s): FREET3 Anemia work up No results for input(s): VITAMINB12, FOLATE, FERRITIN, TIBC, IRON, RETICCTPCT in the last 72 hours. Urinalysis    Component Value Date/Time   COLORURINE AMBER (A) 12/28/2018 1415   APPEARANCEUR HAZY (A) 12/28/2018 1415   APPEARANCEUR Clear 12/21/2017 1824   LABSPEC 1.013 12/28/2018 1415   PHURINE 5.0 12/28/2018 1415   GLUCOSEU NEGATIVE 12/28/2018 1415   HGBUR SMALL (A) 12/28/2018 1415   BILIRUBINUR NEGATIVE 12/28/2018 1415   BILIRUBINUR Negative 12/21/2017 1824   KETONESUR NEGATIVE 12/28/2018 1415   PROTEINUR 100 (A) 12/28/2018 1415   UROBILINOGEN 0.2 12/23/2016 1317   UROBILINOGEN 0.2 05/27/2011 1835   NITRITE NEGATIVE 12/28/2018 1415    LEUKOCYTESUR NEGATIVE 12/28/2018 1415   Sepsis Labs Invalid input(s): PROCALCITONIN,  WBC,  LACTICIDVEN Microbiology No results found for this or any previous visit (from the past 240 hour(s)).   Time coordinating  discharge: 35 minutes  SIGNED:   Louellen Molder, MD  [facility] 01/11/2019, 2:42 PM Pager   If 7PM-7AM, please contact night-coverage [email or web address] Password [email or web address]

## 2019-01-11 NOTE — Progress Notes (Signed)
Pt hasn't called out for staff since 0300. Checked at this time and pt is resting with eyes closed

## 2019-01-11 NOTE — Progress Notes (Signed)
NCM noted PASRR still under manuel review, COVID results pending. Once both completed pt can d/c to SNF. NCM will continue to monitor. Whitman Hero RN,BSN,CM [phone]

## 2019-01-12 LAB — GLUCOSE, CAPILLARY
Glucose-Capillary: 121 mg/dL — ABNORMAL HIGH (ref 70–99)
Glucose-Capillary: 150 mg/dL — ABNORMAL HIGH (ref 70–99)
Glucose-Capillary: 92 mg/dL (ref 70–99)
Glucose-Capillary: 175 mg/dL — ABNORMAL HIGH (ref 70–99)

## 2019-01-12 NOTE — Progress Notes (Signed)
I have seen and examined and discussed plan of care directly with the patient and with her son who is at the bedside-she is stable for discharge to skilled facility when this can be coordinated by social worker  Verneita Griffes, MD [facility] 10:04 AM

## 2019-01-12 NOTE — Discharge Summary (Signed)
Physician Discharge Summary  Teresa [name] PPI:[id number] DOB: [birth year] DOA: 12/28/2018  Seen and examined and agree with plan of care as per my partner who did this discharge summary-she appears very stable and ready to go to rehab  PCP: [name], MD  Admit date: 12/28/2018 Discharge date: 01/12/2019  Admitted From: Home Disposition: Skilled nursing facility  Recommendations for Outpatient Follow-up:  1. Follow up with MD at SNF in 1 week. 2. Check renal function in 1 week and if stable please add low-dose ACE inhibitor or ARB.  Home Health: None Equipment/Devices: 2 L via nasal cannula, wean as tolerated  Discharge Condition: Fair CODE STATUS: Full code Diet recommendation: Heart Healthy / Carb Modified     Discharge Diagnoses:  Principal Problem:   Hypotension due to hypovolemia   Active Problems:     Generalized anxiety disorder   Malaise and fatigue   Essential (primary) hypertension Acute on chronic systolic CHF (congestive heart failure), NYHA class 2 (HCC)   Moderate mitral regurgitation   Diabetes mellitus type 2, diet-controlled (HCC)   Hypothyroidism   Anxiety and depression   Acute renal failure (ARF) (HCC)   Hypokalemia   Atrial fibrillation with RVR (HCC)   Abnormal transaminases   Goals of care, counseling/discussion   Adjustment disorder with mixed anxiety and depressed mood   Palliative care patient   Physical deconditioning   Brief narrative/HPI 78 year old female with history of hypertension, diabetes mellitus, coronary artery disease with chronic systolic CHF (EF of 06-30%), A. fib on anticoagulation and depression presented to the ED with increasing weakness.  Patient has started eating and drinking poorly since 10 days prior to admission after her daughter died from CHF.  Patient presented with hyperglycemia, hypertension, acute on chronic kidney injury and lactic acidosis. On the day following admission she was found to be increasingly  lethargic with worsened acidosis (bicarb of 11 and pH of 7.2).  PCCM was consulted who recommended medical management.  She was started on bicarb drip.  Her hypertension and acute kidney injury was secondary to hypovolemia and dehydration with poor p.o. intake. Hospital course now prolonged with A. fib with RVR and pulmonary edema.  Hospital course   Principal problem Acute on chronic systolic CHF (HCC) Continue torsemide 40 mg daily.  Excellent diuresis on past 72 hours with  almost 9 L urine output.  Not on ACE inhibitors/ARB due to her AKI.    If renal function and blood pressure continues to remain stable, please add low-dose ACE inhibitor or ARB as outpatient. Continue metoprolol at a lower dose.  Cardiology signed off.  Active problems Permanent A. fib with RVR CHA2DS2/VASC of 6.  On Eliquis.    Was placed on amiodarone.  Son not interested in cardioversion given high risk of complications and her underlying comorbidities.  Discontinue amiodarone as per cardiology recommendation.  She was cardioverted in March with return to A. fib 2 months later when she got admitted.  Was also given digoxin initially, now discontinued.  Resume metoprolol at a lower dose 50 mg daily given severe hypotension on presentation. Now rate well controlled.  Continue metoprolol and Eliquis.  Outpatient follow-up with cardiology.  PVCs and NSVT. Low potassium and magnesium  replenished.    Continue metoprolol at current dose.  Acute on chronic kidney disease stage III (HCC) Baseline creatinine of 1.8-2.  Suspect secondary to hypovolemia and hypotension.    Renal function improved to baseline with IV fluids.   Acute metabolic encephalopathy/hepatic encephalopathy Noted  for elevated ammonia of 90 and received lactulose.  Also contributed by hypotension and dehydration.  Encephalopathy has resolved but patient seems depressed and anxious.     Anxiety and major depression. Patient is significantly  depressed and withdrawn since her daughter's death few weeks back.  She is already on Remeron, buspirone and Celexa without much improvement. Psych consulted and appreciate recommendation.  No imminent risk to self or others.  Discontinued Remeron given side effects of fluid retention.  Continue Celexa at current dose and have increased buspirone to 7.5 mg twice daily.  Psych recommends dose can be increased up to 10 mg if no improvement seen.  Confusion and sundowning  avoid benzos at night.  Nighttime melatonin  Acute metabolic acidosis Possibly secondary to ATN.  Resolved with bicarb drip.  Acute respiratory failure with hypoxia Secondary to fluid overload Continues to improve with diuresis.  Currently on 2 L via nasal cannula, wean as tolerated.  Hypoglycemia Secondary to poor p.o. intake.  Now resolved  Hypothyroidism Continue Synthroid.  Levels normal.  Acute gouty arthritis (right big toe). Improved with 3-day course of prednisone.  Hypokalemia and hypomagnesemia Replenished  Transaminitis Possibly shock liver.  Now resolved  Coag negative blood culture Suspect contaminant.  Received  5 days of antibiotic.   Goals of care PT recommends SNF. After multiple conversations, pt and son agree on short term rehab, Palliative care involved as well. Son wants full scope of treatment for now but doesnot want to prolong ventilation or aggressive care if patient not improving after initial resuscitation.   Discharge to SNF once updated COVID-19 results available and negative.     Family Communication  : Discussed with son at bedside  Disposition Plan  :  SNF     Consults  : Cardiology, palliative care  Procedures  : 2D echo    Discharge Instructions  Discharge Instructions    Discharge patient   Complete by: As directed    Discharge disposition: 03-Skilled Nursing Facility   Discharge patient date: 01/12/2019     Allergies as of  01/12/2019      Reactions   Ambien [zolpidem Tartrate] Other (See Comments)     Severe lethargy confusion and restlessness   Aspirin Other (See Comments)   HX Bleeding Ulcer    Nsaids Other (See Comments)   Hx of bleeding ulcers   Tolmetin Rash   Hx of bleeding ulcers      Medication List    STOP taking these medications   amiodarone 200 MG tablet Commonly known as: PACERONE   doxylamine (Sleep) 25 MG tablet Commonly known as: UNISOM   hydrOXYzine 10 MG tablet Commonly known as: ATARAX/VISTARIL   mirtazapine 7.5 MG tablet Commonly known as: REMERON     TAKE these medications   AeroChamber MV inhaler Use as instructed   albuterol 108 (90 Base) MCG/ACT inhaler Commonly known as: ProAir HFA USE 2 PUFFS EVERY 6 HOURS AS NEEDED FOR SHORTNESS OF BREATH AND WHEEZING.   apixaban 5 MG Tabs tablet Commonly known as: ELIQUIS Take 1 tablet (5 mg total) by mouth 2 (two) times daily.   busPIRone 7.5 MG tablet Commonly known as: BUSPAR Take 1 tablet (7.5 mg total) by mouth 2 (two) times daily. What changed:   medication strength  how much to take   citalopram 20 MG tablet Commonly known as: CELEXA TAKE 1 TABLET EACH DAY. What changed: See the new instructions.   clonazePAM 0.25 MG disintegrating tablet Commonly known as: KLONOPIN Take  1 tablet (0.25 mg total) by mouth 2 (two) times daily as needed (anxiety . avoid during bedtime).   famotidine 20 MG tablet Commonly known as: PEPCID Take 1 tablet (20 mg total) by mouth at bedtime.   HYDROcodone-acetaminophen 5-325 MG tablet Commonly known as: NORCO/VICODIN Take 1 tablet by mouth every 6 (six) hours as needed for moderate pain (back). What changed:   when to take this  reasons to take this   levothyroxine 100 MCG tablet Commonly known as: SYNTHROID TAKE 1 TABLET ONCE DAILY BEFORE BREAKFAST.   magnesium oxide 400 (241.3 Mg) MG tablet Commonly known as: MAG-OX Take 0.5 tablets (200 mg total) by mouth 2 (two)  times daily.   Melatonin 3 MG Tabs Take 1 tablet (3 mg total) by mouth at bedtime as needed (PRN sleep).   metoprolol succinate 50 MG 24 hr tablet Commonly known as: TOPROL-XL Take 1 tablet (50 mg total) by mouth daily. Take with or immediately following a meal. What changed:   medication strength  how much to take   polyethylene glycol 17 g packet Commonly known as: MIRALAX / GLYCOLAX Take 17 g by mouth daily as needed for mild constipation.   potassium chloride 10 MEQ CR capsule Commonly known as: MICRO-K Take 1 capsule (10 mEq total) by mouth 2 (two) times daily.   torsemide 20 MG tablet Commonly known as: DEMADEX Take 2 tablets (40 mg total) by mouth daily.            Durable Medical Equipment  (From admission, onward)         Start     Ordered   01/04/19 1334  For home use only DME Hospital bed  Once    Question Answer Comment  Length of Need 6 Months   Head must be elevated greater than: 45 degrees   Bed type Semi-electric      01/04/19 1335         Follow-up Information    MD at SNF Follow up in 1 week(s).          Allergies  Allergen Reactions  . Ambien [Zolpidem Tartrate] Other (See Comments)      Severe lethargy confusion and restlessness  . Aspirin Other (See Comments)    HX Bleeding Ulcer   . Nsaids Other (See Comments)    Hx of bleeding ulcers  . Tolmetin Rash    Hx of bleeding ulcers      Procedures/Studies: Ct Abdomen Wo Contrast  Result Date: 12/28/2018 CLINICAL DATA:  Elevated liver function tests. Nausea vomiting. EXAM: CT ABDOMEN W ITHOUT CONTRAST TECHNIQUE: Multidetector CT imaging of the abdomen was performed following the standard protocol without IV contrast. COMPARISON:  August 30, 2015 FINDINGS: Lower chest: Partially visualized area of scarring/atelectasis in the lingula. Atelectasis versus airspace consolidation in the dependent portion of the left lower lobe. Enlarged heart. Minimal pericardial thickening versus tiny  effusion. Calcific atherosclerotic disease of the aorta and coronary arteries, mild. Hepatobiliary: Normal appearance of the liver. The gallbladder is not abnormally distended but demonstrates a hyperdense appearance. Pancreas: Fatty replaced pancreas. Spleen: Normal in size without focal abnormality. Adrenals/Urinary Tract: Mild left adrenal thickening. Normal nonenhanced appearance of the kidneys. Stomach/Bowel: Stomach is within normal limits. No evidence of bowel wall thickening, distention, or inflammatory changes. Vascular/Lymphatic: Aortic atherosclerosis. No enlarged abdominal lymph nodes. Other: No abdominal wall hernia or abnormality. Musculoskeletal: Spondylosis the lumbosacral spine. IMPRESSION: 1. Unusual hyperdense appearance of the gallbladder. If the patient has had recent administration of  IV contrast, this may represent vicarious excretion of contrast. Otherwise MRCP or ERCP may be considered for further investigation. 2. Enlarged heart. Minimal pericardial thickening versus tiny effusion. 3. Partially visualized area of scarring/atelectasis in the lingula. 4. Atelectasis versus airspace consolidation in the dependent portion of the left lower lobe. 5. Calcific atherosclerotic disease of the aorta and coronary arteries. Electronically Signed   By: Fidela Salisbury M.D.   On: 12/28/2018 14:22   US Renal  Result Date: 12/31/2018 CLINICAL DATA:  Acute renal insufficiency. EXAM: RENAL / URINARY TRACT ULTRASOUND COMPLETE COMPARISON:  CT 12/28/2018 FINDINGS: Right Kidney: Renal measurements: 10.9 x 4.1 x 4.0 cm = volume: 92 mL . Echogenicity within normal limits. No mass or hydronephrosis visualized. 4 mm indeterminate hyperechoic focus over the mid pole cortex which is not seen on recent noncontrast CT. Left Kidney: Renal measurements: 8.9 x 4.5 x 5.0 cm = volume: 104 mL. Echogenicity within normal limits. No mass or hydronephrosis visualized. Bladder: Appears normal for degree of bladder  distention. IMPRESSION: Normal size kidneys without hydronephrosis. Indeterminate 4 mm hyperechoic focus over the mid pole cortex of the right kidney which is not seen on recent noncontrast CT. Recommend follow-up ultrasound 6 months. Electronically Signed   By: Marin Olp M.D.   On: 12/31/2018 16:37   Dg Chest Port 1 View  Result Date: 01/04/2019 CLINICAL DATA:  Increased shortness of breath tonight. History of CHF. EXAM: PORTABLE CHEST 1 VIEW COMPARISON:  Radiograph 01/01/2019 FINDINGS: Cardiomegaly, unchanged. Aortic atherosclerosis. Progressive peribronchial thickening suspicious for pulmonary edema. Hazy bibasilar opacities consistent with pleural effusions. No pneumothorax. IMPRESSION: Development of pulmonary edema and bilateral pleural effusions consistent with CHF. Cardiomegaly is unchanged. Electronically Signed   By: Keith Rake M.D.   On: 01/04/2019 21:53   Dg Chest Port 1 View  Result Date: 01/01/2019 CLINICAL DATA:  Hypoxemia, shortness of breath with weakness today. EXAM: PORTABLE CHEST 1 VIEW COMPARISON:  Chest x-rays dated 12/28/2018 and 10/15/2018. FINDINGS: Chronic opacity at the LEFT lung base presumed scarring/atelectasis based on recent CT abdomen of 12/28/2018. No new lung findings. No pneumothorax seen. Stable cardiomegaly. IMPRESSION: Stable chest x-ray. No evidence of acute cardiopulmonary abnormality. No evidence of pneumonia or pulmonary edema. Electronically Signed   By: Franki Cabot M.D.   On: 01/01/2019 08:13   Dg Chest Port 1 View  Result Date: 12/28/2018 CLINICAL DATA:  Generalized weakness for 3 days. EXAM: PORTABLE CHEST 1 VIEW COMPARISON:  10/15/2018 FINDINGS: The heart is enlarged but stable. There is tortuosity and calcification of the thoracic aorta. The lungs are grossly clear. No infiltrates, edema or effusions. The bony thorax is intact. IMPRESSION: Stable cardiac enlargement but no acute pulmonary findings. Electronically Signed   By: Marijo Sanes M.D.    On: 12/28/2018 10:40   US Abdomen Limited Ruq  Result Date: 12/29/2018 CLINICAL DATA:  Abnormal transaminase level. EXAM: ULTRASOUND ABDOMEN LIMITED RIGHT UPPER QUADRANT COMPARISON:  CT scan of December 28, 2018. FINDINGS: Gallbladder: No gallstones or wall thickening visualized. No sonographic Murphy sign noted by sonographer. Common bile duct: Diameter: 4 mm which is within normal limits. Liver: No focal lesion identified. Increased echogenicity of hepatic parenchyma is noted suggesting hepatic steatosis or possibly other diffuse hepatocellular disease. Portal vein is patent on color Doppler imaging with normal direction of blood flow towards the liver. IMPRESSION: Mildly increased echogenicity of hepatic parenchyma is noted suggesting hepatic steatosis or other diffuse hepatocellular disease. No other abnormality seen in the right upper quadrant of the abdomen.  Electronically Signed   By: Marijo Conception M.D.   On: 12/29/2018 12:08      Subjective: In better mood today.  No overnight events.  Discharge Exam: Vitals:   01/11/19 2230 01/12/19 0735  BP: 120/83 137/78  Pulse: 86 81  Resp:    Temp: 97.6 F (36.4 C) (!) 97.4 F (36.3 C)  SpO2: 98% 97%   Vitals:   01/11/19 1558 01/11/19 2230 01/12/19 0600 01/12/19 0735  BP: 125/78 120/83  137/78  Pulse: 97 86  81  Resp:      Temp: 98 F (36.7 C) 97.6 F (36.4 C)  (!) 97.4 F (36.3 C)  TempSrc: Oral Axillary  Oral  SpO2: 98% 98%  97%  Weight:   92.9 kg   Height:        General: Elderly female in no acute distress, flat affect HEENT: Moist mucosa, supple neck Chest: Clear bilaterally CVS: Normal S1 and S2, no murmurs GI: Soft, nondistended, nontender Musculoskeletal: Warm, trace edema bilaterally     The results of significant diagnostics from this hospitalization (including imaging, microbiology, ancillary and laboratory) are listed below for reference.     Microbiology: Recent Results (from the past 240 hour(s))  Novel  Coronavirus, NAA (hospital order; send-out to ref lab)     Status: None   Collection Time: 01/10/19  2:15 PM   Specimen: Nasopharyngeal Swab; Respiratory  Result Value Ref Range Status   SARS-CoV-2, NAA NOT DETECTED NOT DETECTED Final    Comment: (NOTE) This test was developed and its performance characteristics determined by [employer]. This test has not been FDA cleared or approved. This test has been authorized by FDA under an Emergency Use Authorization (EUA). This test is only authorized for the duration of time the declaration that circumstances exist justifying the authorization of the emergency use of in vitro diagnostic tests for detection of SARS-CoV-2 virus and/or diagnosis of COVID-19 infection under section 564(b)(1) of the Act, 21 U.S.C. 706CBJ-6(E)(8), unless the authorization is terminated or revoked sooner. When diagnostic testing is negative, the possibility of a false negative result should be considered in the context of a patient's recent exposures and the presence of clinical signs and symptoms consistent with COVID-19. An individual without symptoms of COVID-19 and who is not shedding SARS-CoV-2 virus would expect to have a negative (not detected) result in this assay. Performed  At: [facility] [address] [place], [place] [id number] Rush Farmer MD VP:[id number]    [place]  Final    Comment: Performed at [facility], [place] [address], [place],  [address]     Labs: BNP (last 3 results) Recent Labs    10/13/18 0724  BNP 3,500.9*   Basic Metabolic Panel: Recent Labs  Lab 01/06/19 0938 01/07/19 0238 01/08/19 1434 01/09/19 0545 01/10/19 0620 01/11/19 0535  NA 137 136 136 137 137 136  K 4.4 3.4* 3.1* 3.2* 3.8 3.3*  CL 96* 96* 94* 93* 92* 90*  CO2 27 30 31  34* 33* 35*  GLUCOSE 100* 87 136* 78 105* 103*  BUN 38* 36* 30* 29* 25* 24*  CREATININE 1.71* 1.67* 1.50* 1.46* 1.26* 1.12*   CALCIUM 8.4* 7.7* 8.1* 7.8* 8.2* 8.2*  MG 1.6*  --  1.4* 1.3* 1.8  --    Liver Function Tests: Recent Labs  Lab 01/06/19 1655  AST 51*  ALT 40  ALKPHOS 168*  BILITOT 1.6*  PROT 5.8*  ALBUMIN 2.7*   No results for input(s):  LIPASE, AMYLASE in the last 168 hours. No results for input(s): AMMONIA in the last 168 hours. CBC: No results for input(s): WBC, NEUTROABS, HGB, HCT, MCV, PLT in the last 168 hours. Cardiac Enzymes: No results for input(s): CKTOTAL, CKMB, CKMBINDEX, TROPONINI in the last 168 hours. BNP: Invalid input(s): POCBNP CBG: Recent Labs  Lab 01/11/19 0757 01/11/19 1108 01/11/19 1558 01/11/19 2137 01/12/19 0732  GLUCAP 114* 141* 135* 157* 121*   D-Dimer No results for input(s): DDIMER in the last 72 hours. Hgb A1c No results for input(s): HGBA1C in the last 72 hours. Lipid Profile No results for input(s): CHOL, HDL, LDLCALC, TRIG, CHOLHDL, LDLDIRECT in the last 72 hours. Thyroid function studies No results for input(s): TSH, T4TOTAL, T3FREE, THYROIDAB in the last 72 hours.  Invalid input(s): FREET3 Anemia work up No results for input(s): VITAMINB12, FOLATE, FERRITIN, TIBC, IRON, RETICCTPCT in the last 72 hours. Urinalysis    Component Value Date/Time   COLORURINE AMBER (A) 12/28/2018 1415   APPEARANCEUR HAZY (A) 12/28/2018 1415   APPEARANCEUR Clear 12/21/2017 1824   LABSPEC 1.013 12/28/2018 1415   PHURINE 5.0 12/28/2018 1415   GLUCOSEU NEGATIVE 12/28/2018 1415   HGBUR SMALL (A) 12/28/2018 1415   BILIRUBINUR NEGATIVE 12/28/2018 1415   BILIRUBINUR Negative 12/21/2017 1824   KETONESUR NEGATIVE 12/28/2018 1415   PROTEINUR 100 (A) 12/28/2018 1415   UROBILINOGEN 0.2 12/23/2016 1317   UROBILINOGEN 0.2 05/27/2011 1835   NITRITE NEGATIVE 12/28/2018 1415   LEUKOCYTESUR NEGATIVE 12/28/2018 1415   Sepsis Labs Invalid input(s): PROCALCITONIN,  WBC,  LACTICIDVEN Microbiology Recent Results (from the past 240 hour(s))  Novel Coronavirus, NAA (hospital  order; send-out to ref lab)     Status: None   Collection Time: 01/10/19  2:15 PM   Specimen: Nasopharyngeal Swab; Respiratory  Result Value Ref Range Status   SARS-CoV-2, NAA NOT DETECTED NOT DETECTED Final    Comment: (NOTE) This test was developed and its performance characteristics determined by [employer]. This test has not been FDA cleared or approved. This test has been authorized by FDA under an Emergency Use Authorization (EUA). This test is only authorized for the duration of time the declaration that circumstances exist justifying the authorization of the emergency use of in vitro diagnostic tests for detection of SARS-CoV-2 virus and/or diagnosis of COVID-19 infection under section 564(b)(1) of the Act, 21 U.S.C. 570VXB-9(T)(9), unless the authorization is terminated or revoked sooner. When diagnostic testing is negative, the possibility of a false negative result should be considered in the context of a patient's recent exposures and the presence of clinical signs and symptoms consistent with COVID-19. An individual without symptoms of COVID-19 and who is not shedding SARS-CoV-2 virus would expect to have a negative (not detected) result in this assay. Performed  At: [facility] [address] [place], [place] [id number] Rush Farmer MD QT:[id number]    [place]  Final    Comment: Performed at [facility], [place] [address], [place], [place] [address]     Time coordinating discharge: 35 minutes  SIGNED:   Nita Sells, MD  [facility] 01/12/2019, 10:05 AM Pager   If 7PM-7AM, please contact night-coverage [email or web address] Password [email or web address]

## 2019-01-13 LAB — GLUCOSE, CAPILLARY
Glucose-Capillary: 101 mg/dL — ABNORMAL HIGH (ref 70–99)
Glucose-Capillary: 142 mg/dL — ABNORMAL HIGH (ref 70–99)

## 2019-01-13 MED ORDER — CITALOPRAM HYDROBROMIDE 20 MG PO TABS: 20.0000 mg | ORAL_TABLET | Freq: Every day | ORAL | 0 refills | Status: DC

## 2019-01-13 MED ORDER — CLONAZEPAM 0.25 MG PO TBDP: 0.2500 mg | ORAL_TABLET | Freq: Two times a day (BID) | ORAL | 0 refills | Status: DC | PRN

## 2019-01-13 MED ORDER — BUSPIRONE HCL 7.5 MG PO TABS: 7.5000 mg | ORAL_TABLET | Freq: Two times a day (BID) | ORAL | 0 refills | Status: DC

## 2019-01-13 MED ORDER — HYDROCODONE-ACETAMINOPHEN 5-325 MG PO TABS: 1.0000 | ORAL_TABLET | Freq: Four times a day (QID) | ORAL | 0 refills | Status: DC | PRN

## 2019-01-13 NOTE — Progress Notes (Signed)
Physical Therapy Treatment Patient Details Name: Teresa [name] MRN: [id number] DOB: Mar 25, 1941 Today's Date: 01/13/2019    History of Present Illness Pt adm with weakness, hypotension, acute on chronic renal failure. 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 PMH - afib, chf, anxiety, dm, cad,     PT Comments    Pt making steady progress. Ambulation is slowly improving.   Follow Up Recommendations  Supervision/Assistance - 24 hour;SNF(Family unable to provide ample support at home)     Equipment Recommendations  Wheelchair (measurements PT);Wheelchair cushion (measurements PT);Hospital bed    Recommendations for Other Services       Precautions / Restrictions Precautions Precautions: Fall Restrictions Weight Bearing Restrictions: No    Mobility  Bed Mobility                  Transfers Overall transfer level: Needs assistance Equipment used: Rolling walker (2 wheeled) Transfers: Sit to/from Stand Sit to Stand: Mod assist;+2 physical assistance;Min assist         General transfer comment: On first stand pt required +2 mod assist to bring hips up and for balance. Pt with initial posterior lean. On 2nd and 3rd attempt pt required +2 min assist. Verbal cues with hand placement  Ambulation/Gait Ambulation/Gait assistance: +2 safety/equipment;Min assist Gait Distance (Feet): 8 Feet Assistive device: Rolling walker (2 wheeled) Gait Pattern/deviations: Step-to pattern;Decreased step length - right;Decreased step length - left;Shuffle;Trunk flexed Gait velocity: decr Gait velocity interpretation: <1.31 ft/sec, indicative of household ambulator General Gait Details: Assist for balance and support. Verbal cues to incr step length.   Stairs             Wheelchair Mobility    Modified Rankin (Stroke Patients Only)       Balance Overall balance assessment: Needs assistance Sitting-balance support: No upper extremity supported;Feet supported Sitting  balance-Leahy Scale: Fair     Standing balance support: Bilateral upper extremity supported Standing balance-Leahy Scale: Poor Standing balance comment: walker and min assist for static standing                            Cognition Arousal/Alertness: Awake/alert Behavior During Therapy: WFL for tasks assessed/performed Overall Cognitive Status: Impaired/Different from baseline Area of Impairment: Problem solving;Memory                     Memory: Decreased short-term memory       Problem Solving: Slow processing;Requires verbal cues        Exercises      General Comments        Pertinent Vitals/Pain Pain Assessment: No/denies pain    Home Living                      Prior Function            PT Goals (current goals can now be found in the care plan section) Progress towards PT goals: Progressing toward goals    Frequency    Min 2X/week      PT Plan Current plan remains appropriate;Frequency needs to be updated    Co-evaluation              AM-PAC PT "6 Clicks" Mobility   Outcome Measure  Help needed turning from your back to your side while in a flat bed without using bedrails?: Total Help needed moving from lying on your back to sitting on the side of  a flat bed without using bedrails?: A Lot Help needed moving to and from a bed to a chair (including a wheelchair)?: A Lot Help needed standing up from a chair using your arms (e.g., wheelchair or bedside chair)?: A Lot Help needed to walk in hospital room?: A Little Help needed climbing 3-5 steps with a railing? : Total 6 Click Score: 11    End of Session Equipment Utilized During Treatment: Gait belt;Oxygen Activity Tolerance: Patient tolerated treatment well Patient left: with call bell/phone within reach;in chair;with chair alarm set;with family/visitor present Nurse Communication: Mobility status PT Visit Diagnosis: Other abnormalities of gait and mobility  (R26.89);Pain Pain - Right/Left: Right Pain - part of body: Ankle and joints of foot     Time: 9038-3338 PT Time Calculation (min) (ACUTE ONLY): 19 min  Charges:  $Gait Training: 8-22 mins                     [place] Pager [phone] Office [place] 01/13/2019, 11:05 AM

## 2019-01-13 NOTE — Care Management Important Message (Signed)
Important Message  Patient Details  Name: Teresa [name] MRN: [id number] Date of Birth: June 16, 1941   Medicare Important Message Given:  Yes     [name] 01/13/2019, 12:24 PM

## 2019-01-13 NOTE — Progress Notes (Signed)
Nutrition Follow-up  DOCUMENTATION CODES:   Obesity unspecified  INTERVENTION:   -Continue Boost Breeze po TID, each supplement provides 250 kcal and 9 grams of protein  NUTRITION DIAGNOSIS:   Inadequate oral intake related to (altered mental status) as evidenced by per patient/family report.  Ongoing.  GOAL:   Patient will meet greater than or equal to 90% of their needs  Progressing.  MONITOR:   PO intake, Supplement acceptance, Labs, Weight trends, I & O's  ASSESSMENT:   78 year old with past medical history significant for hypertension, diabetes, coronary artery disease, heart failure reduced ejection fraction, A. fib, depression who was brought in because worsening weakness.  **RD working remotely**  Per chart review, pt expected to discharge to SNF when bed is available. No PO has been documented since 7/26. Pt has been taking in adequate amounts of fluids. Pt is drinking Boost Breeze supplements.  Per weight records, pt's weight has returned to admission weight. Per I/Os: -7.1L since admit.  Medications: MAG-OX tablet BID Labs reviewed: CBGs: 101-175 Low K  Diet Order:   Diet Order            DIET DYS 3 Room service appropriate? Yes; Fluid consistency: Thin; Fluid restriction: 1500 mL Fluid  Diet effective now              EDUCATION NEEDS:   Not appropriate for education at this time  Skin:  Skin Assessment: Reviewed RN Assessment  Last BM:  7/29  Height:   Ht Readings from Last 1 Encounters:  12/28/18 5\' 3"  (1.6 m)    Weight:   Wt Readings from Last 1 Encounters:  01/13/19 93 kg    Ideal Body Weight:  52.3 kg  BMI:  Body mass index is 36.32 kg/m.  Estimated Nutritional Needs:   Kcal:  1550-1750  Protein:  65-75g  Fluid:  1.5L/day   Clayton Bibles, MS, RD, LDN [place] Dietitian Pager: [phone] After Hours Pager: [phone]

## 2019-01-13 NOTE — Plan of Care (Signed)
?  Problem: Health Behavior/Discharge Planning: ?Goal: Ability to manage health-related needs will improve ?Outcome: Progressing ?  ?Problem: Clinical Measurements: ?Goal: Will remain free from infection ?Outcome: Progressing ?Goal: Respiratory complications will improve ?Outcome: Progressing ?  ?

## 2019-01-13 NOTE — Progress Notes (Signed)
Seen and examined Long discussion with patient and her son at the bedside no overt changes to discharge medications she was a little tachycardic this morning with multiple excursions from her bed around the room however she feels fine and is stabilized as she has been for the past several days for discharge to facility for rehab  Appreciate social worker input in getting her placed  Verneita Griffes, MD [facility] 3:08 PM

## 2019-01-13 NOTE — TOC Transition Note (Signed)
Transition of Care [facility]) - CM/SW Discharge Note   Patient Details  Name: CHAVA DULAC MRN: [id number] Date of Birth: 05-Nov-1940  [facility]) CM/SW Contact:  Geralynn Ochs, LCSW Phone Number: 01/13/2019, 1:49 PM   Clinical Narrative:   Nurse to call report to [phone], Room 30    Final next level of care: Skilled Nursing Facility Barriers to Discharge: Barriers Resolved   Patient Goals and CMS Choice Patient states their goals for this hospitalization and ongoing recovery are:: Pt son is now agreeable to rehab CMS Medicare.gov Compare Post Acute Care list provided to:: Patient Represenative (must comment) Choice offered to / list presented to : Adult Children  Discharge Placement              Patient chooses bed at: [facility] Patient to be transferred to facility by: [place] Name of family member notified: Son Patient and family notified of of transfer: 01/13/19  Discharge Plan and Services In-house Referral: Clinical Social Work Discharge Planning Services: [employer] Consult Post Acute Care Choice: [place]          DME Arranged: N/A DME Agency: NA       HH Arranged: NA HH Agency: NA Date HH Agency Contacted: 12/29/18 Time [place]: 1134 Representative spoke with at [place]: [place] ([place]) Interventions     Readmission Risk Interventions Readmission Risk Prevention Plan 10/18/2018 10/15/2018  Transportation Screening - Complete  HRI or [place] - Complete  Social Work Consult for [place] Planning/Counseling Complete -  Palliative Care Screening - Not Applicable  Medication Review Press photographer) - Complete  Some recent data might be hidden

## 2019-01-27 ENCOUNTER — Telehealth: Payer: Self-pay | Admitting: Family Medicine

## 2019-01-27 NOTE — Telephone Encounter (Signed)
Copied from [place] [id number]. Topic: General - Other >> Jan 27, 2019  1:33 PM Yvette Rack wrote: Reason for CRM: Pt son Aayanna Mcclennon stated pt was released from a rehab facility on yesterday and he took her to his home but he is unable to take care of pt. [name] requests a call back to discuss options. Cb# [phone]

## 2019-01-27 NOTE — Telephone Encounter (Signed)
Mailbox is full.

## 2019-01-28 ENCOUNTER — Telehealth: Payer: Self-pay | Admitting: Family Medicine

## 2019-01-28 NOTE — Telephone Encounter (Signed)
Call back [place] for a verbal order for [place] Work Evaluation as well as skilled nurse visit. Telephone number to call back is [phone].

## 2019-02-01 ENCOUNTER — Telehealth: Payer: Self-pay | Admitting: Family Medicine

## 2019-02-01 ENCOUNTER — Ambulatory Visit: Payer: Medicare Other | Admitting: Cardiology

## 2019-02-01 NOTE — Telephone Encounter (Signed)
Please call [place] Physical Therapist with [facility]  She is needing verbal order to see patient for home health physical  therapy . Please call [place] at [phone] OK to leave verbal ok on Voice mail . FR

## 2019-02-02 ENCOUNTER — Emergency Department (HOSPITAL_COMMUNITY): Payer: Medicare Other

## 2019-02-02 ENCOUNTER — Other Ambulatory Visit: Payer: Self-pay

## 2019-02-02 ENCOUNTER — Inpatient Hospital Stay (HOSPITAL_COMMUNITY)
Admission: EM | Admit: 2019-02-02 | Discharge: 2019-02-13 | DRG: 291 | Disposition: A | Discharge status: Skilled Nursing Facility | Payer: Medicare Other | Attending: Internal Medicine | Admitting: Internal Medicine

## 2019-02-02 ENCOUNTER — Encounter (HOSPITAL_COMMUNITY): Payer: Self-pay | Admitting: Emergency Medicine

## 2019-02-02 DIAGNOSIS — Z66 Do not resuscitate: Secondary | ICD-10-CM | POA: Diagnosis present

## 2019-02-02 DIAGNOSIS — N179 Acute kidney failure, unspecified: Secondary | ICD-10-CM | POA: Diagnosis present

## 2019-02-02 DIAGNOSIS — E034 Atrophy of thyroid (acquired): Secondary | ICD-10-CM

## 2019-02-02 DIAGNOSIS — K219 Gastro-esophageal reflux disease without esophagitis: Secondary | ICD-10-CM | POA: Diagnosis present

## 2019-02-02 DIAGNOSIS — Z8249 Family history of ischemic heart disease and other diseases of the circulatory system: Secondary | ICD-10-CM | POA: Diagnosis not present

## 2019-02-02 DIAGNOSIS — R410 Disorientation, unspecified: Secondary | ICD-10-CM | POA: Diagnosis present

## 2019-02-02 DIAGNOSIS — R7302 Impaired glucose tolerance (oral): Secondary | ICD-10-CM

## 2019-02-02 DIAGNOSIS — I480 Paroxysmal atrial fibrillation: Secondary | ICD-10-CM | POA: Diagnosis present

## 2019-02-02 DIAGNOSIS — F0390 Unspecified dementia without behavioral disturbance: Secondary | ICD-10-CM | POA: Diagnosis present

## 2019-02-02 DIAGNOSIS — F32A Depression, unspecified: Secondary | ICD-10-CM | POA: Diagnosis present

## 2019-02-02 DIAGNOSIS — F419 Anxiety disorder, unspecified: Secondary | ICD-10-CM | POA: Diagnosis present

## 2019-02-02 DIAGNOSIS — N183 Chronic kidney disease, stage 3 (moderate): Secondary | ICD-10-CM | POA: Diagnosis present

## 2019-02-02 DIAGNOSIS — Z20828 Contact with and (suspected) exposure to other viral communicable diseases: Secondary | ICD-10-CM | POA: Diagnosis present

## 2019-02-02 DIAGNOSIS — Z79890 Hormone replacement therapy (postmenopausal): Secondary | ICD-10-CM | POA: Diagnosis not present

## 2019-02-02 DIAGNOSIS — I4891 Unspecified atrial fibrillation: Secondary | ICD-10-CM | POA: Diagnosis present

## 2019-02-02 DIAGNOSIS — Z515 Encounter for palliative care: Secondary | ICD-10-CM | POA: Diagnosis present

## 2019-02-02 DIAGNOSIS — Z7901 Long term (current) use of anticoagulants: Secondary | ICD-10-CM | POA: Diagnosis not present

## 2019-02-02 DIAGNOSIS — N189 Chronic kidney disease, unspecified: Secondary | ICD-10-CM | POA: Diagnosis not present

## 2019-02-02 DIAGNOSIS — I5023 Acute on chronic systolic (congestive) heart failure: Secondary | ICD-10-CM | POA: Diagnosis present

## 2019-02-02 DIAGNOSIS — Z87891 Personal history of nicotine dependence: Secondary | ICD-10-CM

## 2019-02-02 DIAGNOSIS — I10 Essential (primary) hypertension: Secondary | ICD-10-CM | POA: Diagnosis present

## 2019-02-02 DIAGNOSIS — K591 Functional diarrhea: Secondary | ICD-10-CM

## 2019-02-02 DIAGNOSIS — R0902 Hypoxemia: Secondary | ICD-10-CM

## 2019-02-02 DIAGNOSIS — E039 Hypothyroidism, unspecified: Secondary | ICD-10-CM | POA: Diagnosis present

## 2019-02-02 DIAGNOSIS — F329 Major depressive disorder, single episode, unspecified: Secondary | ICD-10-CM | POA: Diagnosis not present

## 2019-02-02 DIAGNOSIS — Z79899 Other long term (current) drug therapy: Secondary | ICD-10-CM | POA: Diagnosis not present

## 2019-02-02 DIAGNOSIS — I509 Heart failure, unspecified: Secondary | ICD-10-CM

## 2019-02-02 DIAGNOSIS — Z833 Family history of diabetes mellitus: Secondary | ICD-10-CM | POA: Diagnosis not present

## 2019-02-02 DIAGNOSIS — E785 Hyperlipidemia, unspecified: Secondary | ICD-10-CM | POA: Diagnosis present

## 2019-02-02 DIAGNOSIS — R29898 Other symptoms and signs involving the musculoskeletal system: Secondary | ICD-10-CM | POA: Diagnosis present

## 2019-02-02 DIAGNOSIS — E1122 Type 2 diabetes mellitus with diabetic chronic kidney disease: Secondary | ICD-10-CM | POA: Diagnosis present

## 2019-02-02 DIAGNOSIS — I2510 Atherosclerotic heart disease of native coronary artery without angina pectoris: Secondary | ICD-10-CM | POA: Diagnosis present

## 2019-02-02 DIAGNOSIS — I428 Other cardiomyopathies: Secondary | ICD-10-CM | POA: Diagnosis present

## 2019-02-02 DIAGNOSIS — I130 Hypertensive heart and chronic kidney disease with heart failure and stage 1 through stage 4 chronic kidney disease, or unspecified chronic kidney disease: Principal | ICD-10-CM | POA: Diagnosis present

## 2019-02-02 DIAGNOSIS — E119 Type 2 diabetes mellitus without complications: Secondary | ICD-10-CM

## 2019-02-02 DIAGNOSIS — K863 Pseudocyst of pancreas: Secondary | ICD-10-CM

## 2019-02-02 LAB — CBC WITH DIFFERENTIAL/PLATELET
Abs Immature Granulocytes: 0.16 10*3/uL — ABNORMAL HIGH (ref 0.00–0.07)
Basophils Absolute: 0.1 10*3/uL (ref 0.0–0.1)
Basophils Relative: 1 %
Eosinophils Absolute: 0.1 10*3/uL (ref 0.0–0.5)
Eosinophils Relative: 1 %
HCT: 40.8 % (ref 36.0–46.0)
Hemoglobin: 12.4 g/dL (ref 12.0–15.0)
Immature Granulocytes: 2 %
Lymphocytes Relative: 19 %
Lymphs Abs: 1.6 10*3/uL (ref 0.7–4.0)
MCH: 30.2 pg (ref 26.0–34.0)
MCHC: 30.4 g/dL (ref 30.0–36.0)
MCV: 99.5 fL (ref 80.0–100.0)
Monocytes Absolute: 0.9 10*3/uL (ref 0.1–1.0)
Monocytes Relative: 11 %
Neutro Abs: 5.3 10*3/uL (ref 1.7–7.7)
Neutrophils Relative %: 66 %
Platelets: 189 10*3/uL (ref 150–400)
RBC: 4.1 MIL/uL (ref 3.87–5.11)
RDW: 18 % — ABNORMAL HIGH (ref 11.5–15.5)
WBC: 8 10*3/uL (ref 4.0–10.5)
nRBC: 0 % (ref 0.0–0.2)

## 2019-02-02 LAB — URINALYSIS, ROUTINE W REFLEX MICROSCOPIC
Bilirubin Urine: NEGATIVE
Glucose, UA: NEGATIVE mg/dL
Hgb urine dipstick: NEGATIVE
Ketones, ur: NEGATIVE mg/dL
Leukocytes,Ua: NEGATIVE
Nitrite: NEGATIVE
Protein, ur: NEGATIVE mg/dL
Specific Gravity, Urine: 1.01 (ref 1.005–1.030)
pH: 6 (ref 5.0–8.0)

## 2019-02-02 LAB — COMPREHENSIVE METABOLIC PANEL
ALT: 28 U/L (ref 0–44)
AST: 29 U/L (ref 15–41)
Albumin: 3.3 g/dL — ABNORMAL LOW (ref 3.5–5.0)
Alkaline Phosphatase: 189 U/L — ABNORMAL HIGH (ref 38–126)
Anion gap: 17 — ABNORMAL HIGH (ref 5–15)
BUN: 29 mg/dL — ABNORMAL HIGH (ref 8–23)
CO2: 23 mmol/L (ref 22–32)
Calcium: 9.2 mg/dL (ref 8.9–10.3)
Chloride: 98 mmol/L (ref 98–111)
Creatinine, Ser: 1.78 mg/dL — ABNORMAL HIGH (ref 0.44–1.00)
GFR calc Af Amer: 31 mL/min — ABNORMAL LOW (ref >60)
GFR calc non Af Amer: 27 mL/min — ABNORMAL LOW (ref >60)
Glucose, Bld: 129 mg/dL — ABNORMAL HIGH (ref 70–99)
Potassium: 4.5 mmol/L (ref 3.5–5.1)
Sodium: 138 mmol/L (ref 135–145)
Total Bilirubin: 1.9 mg/dL — ABNORMAL HIGH (ref 0.3–1.2)
Total Protein: 6.3 g/dL — ABNORMAL LOW (ref 6.5–8.1)

## 2019-02-02 LAB — TROPONIN I (HIGH SENSITIVITY)
Troponin I (High Sensitivity): 17 ng/L (ref <18)
Troponin I (High Sensitivity): 16 ng/L (ref <18)

## 2019-02-02 LAB — PROTIME-INR
INR: 2.2 — ABNORMAL HIGH (ref 0.8–1.2)
Prothrombin Time: 24.1 seconds — ABNORMAL HIGH (ref 11.4–15.2)

## 2019-02-02 LAB — SARS CORONAVIRUS 2 BY RT PCR (HOSPITAL ORDER, PERFORMED IN ~~LOC~~ HOSPITAL LAB): SARS Coronavirus 2: NEGATIVE

## 2019-02-02 LAB — BRAIN NATRIURETIC PEPTIDE: B Natriuretic Peptide: 1480.7 pg/mL — ABNORMAL HIGH (ref 0.0–100.0)

## 2019-02-02 LAB — AMMONIA: Ammonia: 25 umol/L (ref 9–35)

## 2019-02-02 MED ORDER — METOPROLOL SUCCINATE ER 50 MG PO TB24
50.0000 mg | ORAL_TABLET | Freq: Every day | ORAL | Status: DC
  Administered 2019-02-03 – 2019-02-13 (×11): 50 mg via ORAL
  Filled 2019-02-02 (×11): qty 1

## 2019-02-02 MED ORDER — DILTIAZEM HCL-DEXTROSE 100-5 MG/100ML-% IV SOLN (PREMIX)
5.0000 mg/h | INTRAVENOUS | Status: DC
  Administered 2019-02-02: 5 mg/h via INTRAVENOUS
  Filled 2019-02-02 (×2): qty 100

## 2019-02-02 MED ORDER — LEVOTHYROXINE SODIUM 100 MCG PO TABS
100.0000 ug | ORAL_TABLET | Freq: Every day | ORAL | Status: DC
  Administered 2019-02-02: 100 ug via ORAL
  Filled 2019-02-02: qty 1

## 2019-02-02 MED ORDER — FUROSEMIDE 10 MG/ML IJ SOLN
40.0000 mg | Freq: Two times a day (BID) | INTRAMUSCULAR | Status: DC
  Administered 2019-02-02 – 2019-02-04 (×4): 40 mg via INTRAVENOUS
  Filled 2019-02-02 (×4): qty 4

## 2019-02-02 MED ORDER — MAGNESIUM OXIDE 400 (241.3 MG) MG PO TABS
200.0000 mg | ORAL_TABLET | Freq: Two times a day (BID) | ORAL | Status: DC
  Administered 2019-02-02 – 2019-02-13 (×22): 200 mg via ORAL
  Filled 2019-02-02 (×22): qty 1

## 2019-02-02 MED ORDER — APIXABAN 5 MG PO TABS
5.0000 mg | ORAL_TABLET | Freq: Once | ORAL | Status: AC
  Administered 2019-02-02: 12:00:00 5 mg via ORAL
  Filled 2019-02-02: qty 1

## 2019-02-02 MED ORDER — LEVOTHYROXINE SODIUM 100 MCG PO TABS
100.0000 ug | ORAL_TABLET | Freq: Every day | ORAL | Status: DC
  Administered 2019-02-03 – 2019-02-13 (×11): 100 ug via ORAL
  Filled 2019-02-02 (×11): qty 1

## 2019-02-02 MED ORDER — FUROSEMIDE 10 MG/ML IJ SOLN
40.0000 mg | Freq: Once | INTRAMUSCULAR | Status: AC
  Administered 2019-02-02: 40 mg via INTRAVENOUS
  Filled 2019-02-02: qty 4

## 2019-02-02 MED ORDER — POTASSIUM CHLORIDE CRYS ER 20 MEQ PO TBCR
40.0000 meq | EXTENDED_RELEASE_TABLET | Freq: Once | ORAL | Status: AC
  Administered 2019-02-02: 09:00:00 40 meq via ORAL
  Filled 2019-02-02: qty 2

## 2019-02-02 MED ORDER — HYDROCODONE-ACETAMINOPHEN 5-325 MG PO TABS
1.0000 | ORAL_TABLET | Freq: Four times a day (QID) | ORAL | Status: DC | PRN
  Administered 2019-02-02 – 2019-02-11 (×8): 1 via ORAL
  Filled 2019-02-02 (×8): qty 1

## 2019-02-02 MED ORDER — MELATONIN 3 MG PO TABS
3.0000 mg | ORAL_TABLET | Freq: Every evening | ORAL | Status: DC | PRN
  Administered 2019-02-02 – 2019-02-12 (×8): 3 mg via ORAL
  Filled 2019-02-02 (×9): qty 1

## 2019-02-02 MED ORDER — CITALOPRAM HYDROBROMIDE 10 MG PO TABS
20.0000 mg | ORAL_TABLET | Freq: Every day | ORAL | Status: DC
  Administered 2019-02-03: 11:00:00 20 mg via ORAL
  Filled 2019-02-02: qty 2

## 2019-02-02 MED ORDER — SODIUM CHLORIDE 0.9 % IV SOLN: 250.0000 mL | INTRAVENOUS | Status: DC | PRN

## 2019-02-02 MED ORDER — METOPROLOL TARTRATE 5 MG/5ML IV SOLN
5.0000 mg | Freq: Once | INTRAVENOUS | Status: AC
  Administered 2019-02-02: 5 mg via INTRAVENOUS
  Filled 2019-02-02: qty 5

## 2019-02-02 MED ORDER — SODIUM CHLORIDE 0.9% FLUSH
3.0000 mL | Freq: Two times a day (BID) | INTRAVENOUS | Status: DC
  Administered 2019-02-03 – 2019-02-12 (×18): 3 mL via INTRAVENOUS

## 2019-02-02 MED ORDER — CLONAZEPAM 0.25 MG PO TBDP
0.2500 mg | ORAL_TABLET | Freq: Two times a day (BID) | ORAL | Status: DC | PRN
  Administered 2019-02-02: 0.25 mg via ORAL
  Filled 2019-02-02: qty 1

## 2019-02-02 MED ORDER — SODIUM CHLORIDE 0.9% FLUSH: 3.0000 mL | INTRAVENOUS | Status: DC | PRN

## 2019-02-02 MED ORDER — POLYETHYLENE GLYCOL 3350 17 G PO PACK
17.0000 g | PACK | Freq: Every day | ORAL | Status: DC | PRN
  Administered 2019-02-12: 13:00:00 17 g via ORAL
  Filled 2019-02-02: qty 1

## 2019-02-02 MED ORDER — APIXABAN 5 MG PO TABS
5.0000 mg | ORAL_TABLET | Freq: Two times a day (BID) | ORAL | Status: DC
  Administered 2019-02-02 – 2019-02-13 (×22): 5 mg via ORAL
  Filled 2019-02-02 (×23): qty 1

## 2019-02-02 MED ORDER — ACETAMINOPHEN 325 MG PO TABS
650.0000 mg | ORAL_TABLET | ORAL | Status: DC | PRN
  Administered 2019-02-10: 650 mg via ORAL
  Filled 2019-02-02: qty 2

## 2019-02-02 MED ORDER — BUSPIRONE HCL 15 MG PO TABS
7.5000 mg | ORAL_TABLET | Freq: Every day | ORAL | Status: DC
  Administered 2019-02-02 – 2019-02-13 (×12): 7.5 mg via ORAL
  Filled 2019-02-02: qty 1
  Filled 2019-02-02 (×2): qty 2
  Filled 2019-02-02 (×4): qty 1
  Filled 2019-02-02: qty 2
  Filled 2019-02-02 (×3): qty 1
  Filled 2019-02-02 (×2): qty 2
  Filled 2019-02-02: qty 1
  Filled 2019-02-02: qty 2
  Filled 2019-02-02: qty 1
  Filled 2019-02-02 (×4): qty 2
  Filled 2019-02-02 (×2): qty 1
  Filled 2019-02-02: qty 2

## 2019-02-02 MED ORDER — METOPROLOL SUCCINATE ER 25 MG PO TB24
50.0000 mg | ORAL_TABLET | Freq: Every day | ORAL | Status: DC
  Administered 2019-02-02: 10:00:00 50 mg via ORAL
  Filled 2019-02-02: qty 2

## 2019-02-02 MED ORDER — DILTIAZEM HCL 100 MG IV SOLR
5.0000 mg/h | INTRAVENOUS | Status: DC
  Filled 2019-02-02: qty 100

## 2019-02-02 MED ORDER — NYSTATIN 100000 UNIT/GM EX POWD
Freq: Two times a day (BID) | CUTANEOUS | Status: DC
  Administered 2019-02-02 – 2019-02-12 (×21): via TOPICAL
  Filled 2019-02-02: qty 15

## 2019-02-02 NOTE — H&P (Signed)
History and Physical    Teresa [name] [id number] DOB: 02-22-1941 DOA: 02/02/2019  PCP: [name], MD Consultants:  Percival Spanish - cardiology Patient coming from:  Home - lives with son since d/c from rehab but it is too much for her son and she will have to go back to rehab; NOK: Son, [phone]  Chief Complaint: SOB  HPI: Teresa [name] is a 78 y.o. 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  She was discharged from rehab 1 week ago.  Since then, initially she was weak but doing ok.  Since then, she has had more confusion and LE edema with weeping.  She has been on 1-2L home O2 since d/c.  Last night, she was up much of the night needing to go to the bathroom but very small voids.  She c/o leg and feet pain.  She was having SOB despite O2.  She had tachypnea.  She has been unable to lie flat since March.  +PND this AM.  She has been confused the last 2 days - very episodic.  When she was hospitalized she was confused in March and since then has had more intermittent confusion.  In rehab, she had sundowning.  She was admitted to the [facility] service from 7/14-29 and discharged to SNF; she has since returned home.  She was here with acute on chronic systolic CHF.  She lived independently until March; after that she could ambulate with a walker.  Since her most recent hospitalization, her daughter died 2023-01-13 weekend and since then she has dramatically declined.  2 days ago, PT was there and she could walk some with assistance and walker.  Since then, she has regressed.  ED Course:  CHF exacerbation, doing better until last night - SOB, confusion.  Visibly dyspneic, looks like CHF exacerbation again.  Elevated BNP and CHF on CXR.  Afib, given Metoprol and Lasix.   Review of  Systems: As per HPI; otherwise review of systems reviewed and negative.   Ambulatory Status:  Ambulates without assistance  Past Medical History:  Diagnosis Date  . Abdominal pain   . Anxiety    maintained on Xanax tid for years.  . Arthritis    FEET, KNEES, HANDS  . Atrial fibrillation ([place]) 09/2018  . Chronic systolic CHF (congestive heart failure), NYHA class 2 ([place])    EF 25% 2011 MV,  50-55% echo 6/13  . Diabetes mellitus type 2, diet-controlled ([place])   . GERD (gastroesophageal reflux disease)   . Glaucoma    s/p surgery B in 30s.  [place].  . H/O hiatal hernia   . History of glaucoma   . History of small bowel obstruction 2009 AND NOV 2012  . HTN (hypertension)   . Hypothyroidism   . Left ovarian cyst   . Moderate mitral regurgitation   . Moderate tricuspid regurgitation   . Non-healing surgical wound    ABDOMINAL S/P EXPLORATOY LAPAROTOMY 04-18-2011  . Nonischemic cardiomyopathy ([place]) [facility]    EF is 25% per echo February 2012, non ischemic myoview 12/2009.  EF 50% echo 7/12 and plans for ICD cancelled.   . Peptic ulcer disease   . PVC (premature ventricular contraction)   . Spinal stenosis   . Urge urinary incontinence   . Valvular heart disease    Moderate to  severe MR, moderate TR, LAE per echo 7/11  . Wears dentures    upper denture-lower partial    Past Surgical History:  Procedure Laterality Date  . ABDOMINAL HERNIA REPAIR  32 YRS AGO   PERITINITIS  . APPENDECTOMY  1979   RUPTURED  . BENIGN RIGHT BREAST TUMOR REMOVED    . CARDIOVASCULAR STRESS TEST  JULY 2011-  DR HOCHREIN   NO ISCHEMIA  . CARDIOVERSION N/A 08/17/2018   Procedure: CARDIOVERSION;  Surgeon: Sueanne Margarita, MD;  Location: [facility];  Service: Cardiovascular;  Laterality: N/A;  . EXPLORATOMY LAP. / EXTENSIVE LYSIS ADHESIONS/ SMALL BOWEL RESECTION X2 WITH PRIMARY ANASTOMOSIS X2  04-18-2011   SMALL BOWEL OBSTRUCTION  . GLAUCOMA SURGERY  1978  . HERNIA REPAIR    . INCISION AND DRAINAGE  OF WOUND N/A 09/13/2012   Procedure: INCISION  AND DEBRIDEMENT WOUND abdominal wall ;  Surgeon: Rolm Bookbinder, MD;  Location: [place];  Service: General;  Laterality: N/A;  coordination with Dr Migdalia Dk   . KNEE ARTHROSCOPY W/ MENISCECTOMY  06-05-2004  . TEE WITHOUT CARDIOVERSION N/A 08/17/2018   Procedure: TRANSESOPHAGEAL ECHOCARDIOGRAM (TEE);  Surgeon: Sueanne Margarita, MD;  Location: [facility] END[facility];  Service: Cardiovascular;  Laterality: N/A;  . THYROIDECTOMY  1978 GOITOR   AND CERVICAL COLD KNIFE CONE BX  . TONSILLECTOMY  AGE 88  . TRANSTHORACIC ECHOCARDIOGRAM  12-27-2010   EF 45-50%/ MODERATE MV REGURG. / LEFT ATRIUM MILDLY DILATED/ MILD TRICUSPID REGURG.  . TUBAL LIGATION    . VENTRAL HERNIA REPAIR  X4  LAST ONE 20 YRS AGO   ABDOMINAL --  EACH ONE IN DIFFERENT AREA  . VENTRAL HERNIA REPAIR  01/29/2012   Procedure: HERNIA REPAIR VENTRAL ADULT;  Surgeon: Theodoro Kos, DO;  Location: [place];  Service: Plastics;  Laterality: N/A;  . WOUND DEBRIDEMENT  09/25/2011   Procedure: DEBRIDEMENT CLOSURE/ABDOMINAL WOUND;  Surgeon: Theodoro Kos, DO;  Location: [place];  Service: Plastics;  Laterality: N/A;  excision of abdominal wound with primary closure    Social History   Socioeconomic History  . Marital status: Widowed    Spouse name: Not on file  . Number of children: 2  . Years of education: Not on file  . Highest education level: Not on file  Occupational History  . Occupation: RETIRED  Social Needs  . Financial resource strain: Not on file  . Food insecurity    Worry: Not on file    Inability: Not on file  . Transportation needs    Medical: Not on file    Non-medical: Not on file  Tobacco Use  . Smoking status: Former Smoker    Packs/day: 1.50    Years: 45.00    Pack years: 67.50    Types: Cigarettes    Start date: 11/14/1964    Quit date: 09/22/2010    Years since quitting: 8.3  . Smokeless tobacco: Never Used  . Tobacco comment: quit 2011 or  2012 - Didn't smoke during pregnancies  Substance and Sexual Activity  . Alcohol use: No  . Drug use: No  . Sexual activity: Not Currently  Lifestyle  . Physical activity    Days per week: Not on file    Minutes per session: Not on file  . Stress: Not on file  Relationships  . Social Herbalist on phone: Not on file    Gets together: Not on file    Attends religious service: Not on file  Active member of club or organization: Not on file    Attends meetings of clubs or organizations: Not on file    Relationship status: Not on file  . Intimate partner violence    Fear of current or ex partner: Not on file    Emotionally abused: Not on file    Physically abused: Not on file    Forced sexual activity: Not on file  Other Topics Concern  . Not on file  Social History Narrative   Marital status:  Widowed as of 10/05/2010; not dating.        Children: four children (1 daughter in [place], 1 daughter in [place] estranged; 1 son in [place]; 1 son deceased in 10/05/2014); six grandchildren; 5 gg.      Lives: alone with a yorkie.  Son/Harry lives close.        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      Tobacco:  None; quit in 2010-10-05.        Alcohol: none      Drugs:none      Exercise:  No formal exercise program.  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  Cannot swim.        ADLs:  Driving; cleans own house; does grocery shopping; pays bills; does not use cane for ambulation.  Drives.        Advanced Directives: no living will; no HCPOA.  DNR/DNI.        [place] [facility]:   Originally from [facility]. Has always lived in [place]. She moved to [place], [place]. 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 Has a dog currently. No bird or mold exposure.     Allergies  Allergen Reactions  . Ambien [Zolpidem Tartrate] Other (See Comments)      Severe lethargy confusion and restlessness  . Aspirin Other (See Comments)    HX  Bleeding Ulcer   . Nsaids Other (See Comments)    Hx of bleeding ulcers  . Tolmetin Rash    Hx of bleeding ulcers    Family History  Problem Relation Age of Onset  . Stroke Brother   . Diabetes Brother   . Tuberculosis Father   . Stroke Mother   . Stroke Sister   . Heart disease Sister   . Emphysema Sister   . Diabetes Daughter     Prior to Admission medications   Medication Sig Start Date End Date Taking? Authorizing Provider  albuterol (PROAIR HFA) 108 (90 Base) MCG/ACT inhaler USE 2 PUFFS EVERY 6 HOURS AS NEEDED FOR SHORTNESS OF BREATH AND WHEEZING. 10/05/18  Yes Stallings, Zoe A, MD  apixaban (ELIQUIS) 5 MG TABS tablet Take 1 tablet (5 mg total) by mouth 2 (two) times daily. 10/20/18  Yes Seawell, Jaimie A, DO  busPIRone (BUSPAR) 7.5 MG tablet Take 1 tablet (7.5 mg total) by mouth 2 (two) times daily. Patient taking differently: Take 7.5 mg by mouth daily.  01/13/19  Yes Nita Sells, MD  citalopram (CELEXA) 20 MG tablet Take 1 tablet (20 mg total) by mouth daily. 01/14/19  Yes Nita Sells, MD  clonazePAM (KLONOPIN) 0.25 MG disintegrating tablet Take 1 tablet (0.25 mg total) by mouth 2 (two) times daily as needed (anxiety . avoid during bedtime). 01/13/19  Yes Nita Sells, MD  famotidine (PEPCID) 20 MG tablet Take 1 tablet (20 mg total) by mouth at bedtime. 12/01/18  Yes [name], MD  HYDROcodone-acetaminophen (NORCO/VICODIN) 5-325 MG tablet Take  1 tablet by mouth every 6 (six) hours as needed for severe pain. 01/13/19  Yes Nita Sells, MD  levothyroxine (SYNTHROID) 100 MCG tablet TAKE 1 TABLET ONCE DAILY BEFORE BREAKFAST. Patient taking differently: Take 100 mcg by mouth daily before breakfast.  11/03/18  Yes [name], MD  magnesium oxide (MAG-OX) 400 (241.3 Mg) MG tablet Take 0.5 tablets (200 mg total) by mouth 2 (two) times daily. 01/11/19  Yes Dhungel, Nishant, MD  Melatonin 3 MG TABS Take 1 tablet (3 mg total) by mouth at bedtime as  needed (PRN sleep). 01/11/19  Yes Dhungel, Nishant, MD  metoprolol succinate (TOPROL-XL) 50 MG 24 hr tablet Take 1 tablet (50 mg total) by mouth daily. Take with or immediately following a meal. 01/12/19  Yes Dhungel, Nishant, MD  polyethylene glycol (MIRALAX / GLYCOLAX) 17 g packet Take 17 g by mouth daily as needed for mild constipation. 01/11/19  Yes Dhungel, Nishant, MD  potassium chloride (MICRO-K) 10 MEQ CR capsule Take 1 capsule (10 mEq total) by mouth 2 (two) times daily. 11/26/18  Yes [name], MD  torsemide (DEMADEX) 20 MG tablet Take 2 tablets (40 mg total) by mouth daily. Patient taking differently: Take 20 mg by mouth daily.  10/20/18  Yes Seawell, Jaimie A, DO  Spacer/Aero-Holding Chambers (AEROCHAMBER MV) inhaler Use as instructed 05/01/16   Javier Glazier, MD    Physical Exam: Vitals:   02/02/19 1100 02/02/19 1115 02/02/19 1130 02/02/19 1200  BP: 111/84  103/72 104/85  Pulse: (!) 120 (!) 107    Resp: (!) 23 (!) 21 (!) 22 (!) 24  Temp:      TempSrc:      SpO2: 99% 96%    Weight:      Height:         . General:  Appears calm and comfortable and is NAD; she is pleasantly confused . Eyes:  PERRL, EOMI, normal lids, iris . ENT:  grossly normal hearing, lips & tongue, mmm; artificial upper dentition, sparse lower dentition . Neck:  no LAD, masses or thyromegaly . Cardiovascular:  Irregularly irregular with mild tachycardia, no m/r/g.  . Respiratory:   CTA bilaterally with no wheezes/rhonchi, mild bibasilar crackles.  Mildly increased respiratory effort. . Abdomen:  soft, NT, ND, NABS; surgical scars and abdominal wall hernia noted . Skin:  Intertriginous rash under pannus and along groin . Musculoskeletal:  grossly normal tone BUE/BLE, good ROM, no bony abnormality . Lower extremity:  3+ pitting LE edema that weeps at times . Psychiatric:  grossly normal mood and affect, speech fluent and appropriate, AOx1-2 . Neurologic:  CN 2-12 grossly intact, moves all  extremities in coordinated fashion    Radiological Exams on Admission: Dg Chest Port 1 View  Result Date: 02/02/2019 CLINICAL DATA:  Altered for 2-3 days.  Increased edema. EXAM: PORTABLE CHEST 1 VIEW COMPARISON:  01/04/2019 FINDINGS: Bilateral small pleural effusions. Bilateral diffuse interstitial thickening. No pneumothorax. Stable cardiomegaly. No acute osseous abnormality. IMPRESSION: Findings concerning for mild CHF. Electronically Signed   By: Kathreen Devoid   On: 02/02/2019 10:03    EKG: Independently reviewed.  Afib with rate 120; prolonged QTc 594; nonspecific ST changes with no evidence of acute ischemia; NSCSLT   Labs on Admission: I have personally reviewed the available labs and imaging studies at the time of the admission.  Pertinent labs:   Glucose 129 BUN 29/Creatinine 1.78/GFR 27; 24/1.12/47 on 7/28 Anion gap 17 AP 189 Albumin 3.3 Bili 1.9 BNP 1480.7; 1263.8  on 4/29 HS troponin 17, 16 NH4 25 Essentially normal CBC INR 2.2 UA WNL   Assessment/Plan Principal Problem:   Acute on chronic systolic (congestive) heart failure (HCC) Active Problems:   Dyslipidemia   Essential (primary) hypertension   Diabetes mellitus type 2, diet-controlled (HCC)   Hypothyroidism   Anxiety and depression   Acute kidney injury superimposed on CKD (HCC)   Atrial fibrillation with RVR (HCC)   Dementia without behavioral disturbance (HCC)   Severe muscle deconditioning    Acute on chronic systolic CHF exacerbation -Patient with recent hospitalization for CHF presenting with worsening SOB and weeping LE edema -CXR consistent with mild pulmonary edema -Normal WBC count -Elevated BNP -Uncontrolled afib with RVR on telemetry -With elevated BNP and abnl CXR, recurrent CHF seems probable as diagnosis -Will admit with telemetry -7/15 echocardiogram with EF 20-25%, will not repeat at this time -No ACE due to renal dysfunction -Continue Toprol XL -CHF order set utilized -Was  given Lasix 40 mg x 1 in ER and will repeat with 40 mg IV BID -Continue [place] O2 for now -Repeat EKG in AM -HS troponin negative x 2, low suspicion for ACS as the cause  Atrial Fibrillation:   -Patient presenting with recurrent afib with RVR  -She was given Lopressor 5 mg in the ER but RVR is persistent  -Will admit to SDU for Diltiazem drip as per protocol with plan to transition to PO Diltiazem once heart rate is controlled.   -Continue Eliquis  AKI on stage 3 CKD -Creatinine decreased to as low as 1.12 on 7/28, but review of baseline creatinine shows variability with stage 3 CKD   -There may be a component of AKI associated with decreased renal perfusion in the setting of CHF/afib -Will monitor with diuresis  HTN -Continue Toprol XL  HLD -She does not appear to be taking medications for this issue  DM -Diet controlled -Will monitor with fasting labs for now  Anxiety/depression/dementia -Patient suffered loss of her daughter in July and has been declining since then -She has underlying depression/anxiety and takes Buspar, Celexa (resume tomorrow based on prolonged QTc today - suggest recheck on tomorrow's EKG to ensure safety), and Klonopin -Based on prior hospitalization and son's report, I strongly suspect underlying dementia that has been unmasked by recent life stressors and medical problems -She is pleasantly confused at this time, but apparently experienced significant sundowning while in SNF  Hypothyroidism -Continue Synthroid at current dose  Deconditioning -Patient went to rehab following last hospitalization -She lived alone until march -She is now too difficult to care for in her son's home and he reports that she will need placement -SW consulted, PT/OT consulted    Note: This patient has been tested and is negative for the novel coronavirus COVID-19.   DVT prophylaxis: Lovenox  Code Status:  DNR - confirmed with son Family Communication: Son was present  throughout evaluation Disposition Plan:  Home once clinically improved Consults called: SW/PT/OT Admission status: Admit - It is my clinical opinion that admission to INPATIENT is reasonable and necessary because this patient will require at least 2 midnights in the hospital to treat this condition based on the medical complexity of the problems presented.  Given the aforementioned information, the predictability of an adverse outcome is felt to be significant.     Karmen Bongo MD [facility]   How to contact the [facility] Attending or Consulting provider [place] or covering provider during after hours [place], for this patient?  1. Check the care team in [facility] and look for a) attending/consulting TRH provider listed and b) the [facility] team listed 2. Log into [email or web address] and use [place]'s universal password to access. If you do not have the password, please contact the hospital operator. 3. Locate the [facility] provider you are looking for under [facility] and page to a number that you can be directly reached. 4. If you still have difficulty reaching the provider, please page the [facility] (Director on Call) for the Hospitalists listed on amion for assistance.   02/02/2019, 1:16 PM

## 2019-02-02 NOTE — ED Notes (Signed)
Bladder scan showed 102ml due to pt just urinating in bed.

## 2019-02-02 NOTE — ED Notes (Signed)
Pt refusing to have chest xray. Denies any SOB or pain. States she does not know why her family sent her to the hospital.

## 2019-02-02 NOTE — ED Notes (Addendum)
Admitting provider at bedside for evaluation.

## 2019-02-02 NOTE — ED Notes (Signed)
Son is at bedside

## 2019-02-02 NOTE — ED Provider Notes (Addendum)
[facility] EMERGENCY DEPARTMENT Provider Note   CSN: [id number] Arrival date & time: 02/02/19  0631     History   Chief Complaint Chief Complaint  Patient presents with  . multiple complaints    HPI Teresa [name] is a 78 y.o. female.     HPI Complex medical history with hospitalization at the end of July for CHF, atrial fibrillation and encephalopathy.  Patient was diuresed at that time and improved after hospitalization.  She reports that she felt like she had been doing fine at home.  She reports that her son noted her to be short of breath this morning.  She reports that "he noticed it before I did".  She denies she is having any chest pain.  Reports she is taking her medications as prescribed.  She reports she is taking the Lasix but it does not seem like she is urinating any more than usual.  She reports her mouth feels very dry.  He denies she is had any fever or cough.  She is on chronic home O2.  She reports her legs are swollen but she is not sure that is more than normal for her.  She reports she would like to be able to go back home. Past Medical History:  Diagnosis Date  . Abdominal pain   . Anxiety    maintained on Xanax tid for years.  . Arthritis    FEET, KNEES, HANDS  . Atrial fibrillation ([place]) 09/2018  . Chronic systolic CHF (congestive heart failure), NYHA class 2 ([place])    EF 25% 2011 MV,  50-55% echo 6/13  . Diabetes mellitus type 2, diet-controlled ([place])   . GERD (gastroesophageal reflux disease)   . Glaucoma    s/p surgery B in 30s.  [place].  . H/O hiatal hernia   . History of glaucoma   . History of small bowel obstruction 2009 AND NOV 2012  . HTN (hypertension)   . Hypothyroidism   . Left ovarian cyst   . Moderate mitral regurgitation   . Moderate tricuspid regurgitation   . Non-healing surgical wound    ABDOMINAL S/P EXPLORATOY LAPAROTOMY 04-18-2011  . Nonischemic cardiomyopathy ([place]) [facility]    EF is 25% per echo February  2012, non ischemic myoview 12/2009.  EF 50% echo 7/12 and plans for ICD cancelled.   . Peptic ulcer disease   . PVC (premature ventricular contraction)   . Spinal stenosis   . Urge urinary incontinence   . Valvular heart disease    Moderate to severe MR, moderate TR, LAE per echo 7/11  . Wears dentures    upper denture-lower partial    Patient Active Problem List   Diagnosis Date Noted  . Palliative care patient 01/11/2019  . Physical deconditioning 01/11/2019  . Major depressive disorder 01/11/2019  . Adjustment disorder with mixed anxiety and depressed mood 01/08/2019  . Goals of care, counseling/discussion   . Sepsis ([place]) 12/28/2018  . Abnormal transaminases 10/15/2018  . Thrombocytopenia ([place]) 10/15/2018  . Acute on chronic congestive heart failure ([place])   . AKI (acute kidney injury) ([place])   . Hypomagnesemia   . Pressure injury of skin 08/12/2018  . Acute renal failure (ARF) ([place]) 08/10/2018  . Hypotension due to hypovolemia 08/10/2018  . Hypokalemia 08/10/2018  . Hyponatremia 08/10/2018  . Metabolic acidosis, increased anion gap 08/10/2018  . Hypocalcemia 08/10/2018  . Elevated troponin 08/10/2018  . Prolonged QT interval 08/10/2018  . Atrial fibrillation with RVR ([place]) 08/10/2018  .  Pancreatic pseudocyst 11/09/2017  . Polymyalgia rheumatica ([place]) 10/12/2016  . Anxiety and depression 10/12/2016  . Bilateral hip pain 06/24/2016  . Chronic pain of both shoulders 06/24/2016  . Psychophysiological insomnia 05/12/2016  . Spinal stenosis   . History of glaucoma   . History of small bowel obstruction   . Peptic ulcer disease   . Chronic systolic CHF (congestive heart failure), NYHA class 2 ([place])   . Moderate mitral regurgitation   . Moderate tricuspid regurgitation   . H/O hiatal hernia   . Urge urinary incontinence   . Left ovarian cyst   . Diabetes mellitus type 2, diet-controlled ([place])   . Shortness of breath   . Wears dentures   . Hypothyroidism   . Generalized  anxiety disorder 09/15/2013  . Hernia, incisional 05/10/2012  . Abdominal wall hernia 02/10/2012  . Dyslipidemia 05/01/2011  . GERD (gastroesophageal reflux disease) 05/01/2011  . Osteoarthritis 05/01/2011  . Chronic renal insufficiency 05/01/2011  . HLD (hyperlipidemia) 05/01/2011  . SBO (small bowel obstruction), s/p expl lap 04/24/2011  . Nonischemic cardiomyopathy ([place])   . Arthritis of ankle or foot, degenerative 07/12/2010  . Arthritis of hand, degenerative 07/12/2010  . Malaise and fatigue 07/12/2010  . Essential (primary) hypertension 07/12/2010  . Glaucoma 07/12/2010    Past Surgical History:  Procedure Laterality Date  . ABDOMINAL HERNIA REPAIR  32 YRS AGO   PERITINITIS  . APPENDECTOMY  1979   RUPTURED  . BENIGN RIGHT BREAST TUMOR REMOVED    . CARDIOVASCULAR STRESS TEST  JULY 2011-  DR HOCHREIN   NO ISCHEMIA  . CARDIOVERSION N/A 08/17/2018   Procedure: CARDIOVERSION;  Surgeon: Sueanne Margarita, MD;  Location: [facility] ENDOSCOPY;  Service: Cardiovascular;  Laterality: N/A;  . EXPLORATOMY LAP. / EXTENSIVE LYSIS ADHESIONS/ SMALL BOWEL RESECTION X2 WITH PRIMARY ANASTOMOSIS X2  04-18-2011   SMALL BOWEL OBSTRUCTION  . GLAUCOMA SURGERY  1978  . HERNIA REPAIR    . INCISION AND DRAINAGE OF WOUND N/A 09/13/2012   Procedure: INCISION  AND DEBRIDEMENT WOUND abdominal wall ;  Surgeon: Rolm Bookbinder, MD;  Location: [facility];  Service: General;  Laterality: N/A;  coordination with Dr Migdalia Dk   . KNEE ARTHROSCOPY W/ MENISCECTOMY  06-05-2004  . TEE WITHOUT CARDIOVERSION N/A 08/17/2018   Procedure: TRANSESOPHAGEAL ECHOCARDIOGRAM (TEE);  Surgeon: Sueanne Margarita, MD;  Location: [facility] ENDOSCOPY;  Service: Cardiovascular;  Laterality: N/A;  . THYROIDECTOMY  1978 GOITOR   AND CERVICAL COLD KNIFE CONE BX  . TONSILLECTOMY  AGE 89  . TRANSTHORACIC ECHOCARDIOGRAM  12-27-2010   EF 45-50%/ MODERATE MV REGURG. / LEFT ATRIUM MILDLY DILATED/ MILD TRICUSPID REGURG.  . TUBAL LIGATION    . VENTRAL HERNIA REPAIR   X4  LAST ONE 20 YRS AGO   ABDOMINAL --  EACH ONE IN DIFFERENT AREA  . VENTRAL HERNIA REPAIR  01/29/2012   Procedure: HERNIA REPAIR VENTRAL ADULT;  Surgeon: Theodoro Kos, DO;  Location: [facility];  Service: Plastics;  Laterality: N/A;  . WOUND DEBRIDEMENT  09/25/2011   Procedure: DEBRIDEMENT CLOSURE/ABDOMINAL WOUND;  Surgeon: Theodoro Kos, DO;  Location: [facility];  Service: Plastics;  Laterality: N/A;  excision of abdominal wound with primary closure     OB History   No obstetric history on file.      Home Medications    Prior to Admission medications   Medication Sig Start Date End Date Taking? Authorizing Provider  albuterol (PROAIR HFA) 108 (90 Base) MCG/ACT inhaler USE 2 PUFFS EVERY  6 HOURS AS NEEDED FOR SHORTNESS OF BREATH AND WHEEZING. 10/05/18   Forrest Moron, MD  apixaban (ELIQUIS) 5 MG TABS tablet Take 1 tablet (5 mg total) by mouth 2 (two) times daily. 10/20/18   Seawell, Jaimie A, DO  busPIRone (BUSPAR) 7.5 MG tablet Take 1 tablet (7.5 mg total) by mouth 2 (two) times daily. 01/13/19   Nita Sells, MD  citalopram (CELEXA) 20 MG tablet Take 1 tablet (20 mg total) by mouth daily. 01/14/19   Nita Sells, MD  clonazePAM (KLONOPIN) 0.25 MG disintegrating tablet Take 1 tablet (0.25 mg total) by mouth 2 (two) times daily as needed (anxiety . avoid during bedtime). 01/13/19   Nita Sells, MD  famotidine (PEPCID) 20 MG tablet Take 1 tablet (20 mg total) by mouth at bedtime. 12/01/18   [name], MD  HYDROcodone-acetaminophen (NORCO/VICODIN) 5-325 MG tablet Take 1 tablet by mouth every 6 (six) hours as needed for severe pain. 01/13/19   Nita Sells, MD  levothyroxine (SYNTHROID) 100 MCG tablet TAKE 1 TABLET ONCE DAILY BEFORE BREAKFAST. 11/03/18   [name], MD  magnesium oxide (MAG-OX) 400 (241.3 Mg) MG tablet Take 0.5 tablets (200 mg total) by mouth 2 (two) times daily. 01/11/19   Dhungel, Flonnie Overman, MD  Melatonin  3 MG TABS Take 1 tablet (3 mg total) by mouth at bedtime as needed (PRN sleep). 01/11/19   Dhungel, Flonnie Overman, MD  metoprolol succinate (TOPROL-XL) 50 MG 24 hr tablet Take 1 tablet (50 mg total) by mouth daily. Take with or immediately following a meal. 01/12/19   Dhungel, Nishant, MD  polyethylene glycol (MIRALAX / GLYCOLAX) 17 g packet Take 17 g by mouth daily as needed for mild constipation. 01/11/19   Dhungel, Nishant, MD  potassium chloride (MICRO-K) 10 MEQ CR capsule Take 1 capsule (10 mEq total) by mouth 2 (two) times daily. 11/26/18   [name], MD  Spacer/Aero-Holding Chambers (AEROCHAMBER MV) inhaler Use as instructed 05/01/16   Javier Glazier, MD  torsemide (DEMADEX) 20 MG tablet Take 2 tablets (40 mg total) by mouth daily. 10/20/18   Seawell, Laurell Roof, DO    Family History Family History  Problem Relation Age of Onset  . Stroke Brother   . Diabetes Brother   . Tuberculosis Father   . Stroke Mother   . Stroke Sister   . Heart disease Sister   . Emphysema Sister   . Diabetes Daughter     Social History Social History   Tobacco Use  . Smoking status: Former Smoker    Packs/day: 1.50    Years: 45.00    Pack years: 67.50    Types: Cigarettes    Start date: 11/14/1964    Quit date: 09/22/2010    Years since quitting: 8.3  . Smokeless tobacco: Never Used  . Tobacco comment: quit 2011 or 2012 - Didn't smoke during pregnancies  Substance Use Topics  . Alcohol use: No  . Drug use: No     Allergies   Ambien [zolpidem tartrate], Aspirin, Nsaids, and Tolmetin   Review of Systems Review of Systems 10 Systems reviewed and are negative for acute change except as noted in the HPI.  Physical Exam Updated Vital Signs Pulse (!) 121   Temp 98.3 F (36.8 C) (Oral)   Resp (!) 25   SpO2 98%   Physical Exam Constitutional:      Comments: Patient is alert and appropriate.  Mild tachypnea but no significant distress at rest.  HENT:  Head: Normocephalic and atraumatic.      Mouth/Throat:     Mouth: Mucous membranes are moist.     Pharynx: Oropharynx is clear.  Eyes:     Extraocular Movements: Extraocular movements intact.  Cardiovascular:     Comments: Monitor shows atrial fibrillation rate in the low 1 teens.  Heart irregularly irregular. Pulmonary:     Comments: Mild tachypnea.  Crackles lower one third of lung fields.  Patient is in no distress speaking in full sentences. Abdominal:     General: There is no distension.     Palpations: Abdomen is soft.     Tenderness: There is no abdominal tenderness. There is no guarding.     Comments: Patient has protuberant abdomen.  Nontender.  She does have candidal rash bilateral inguinal groin folds.  Patient was not aware of this.  Musculoskeletal:     Comments: 2+ pitting edema bilateral lower extremities.  Striae of the lower extremities.  Skin:    General: Skin is warm and dry.  Neurological:     General: No focal deficit present.     Mental Status: She is oriented to person, place, and time.     Coordination: Coordination normal.  Psychiatric:        Mood and Affect: Mood normal.      ED Treatments / Results  Labs (all labs ordered are listed, but only abnormal results are displayed) Labs Reviewed - No data to display  EKG EKG Interpretation  Date/Time:  Wednesday February 02 2019 06:43:17 EDT Ventricular Rate:  120 PR Interval:    QRS Duration: 100 QT Interval:  420 QTC Calculation: 594 R Axis:   -55 Text Interpretation:  Atrial fibrillation Incomplete RBBB and LAFB Anteroseptal infarct, age indeterminate Prolonged QT interval no sig change from previous Confirmed by Charlesetta Shanks [phone]) on 02/02/2019 8:41:16 AM   Radiology No results found.  Procedures Procedures (including critical care time) CRITICAL CARE Performed by: Charlesetta Shanks   Total critical care time: 45 minutes  Critical care time was exclusive of separately billable procedures and treating other patients.   Critical care was necessary to treat or prevent imminent or life-threatening deterioration.  Critical care was time spent personally by me on the following activities: development of treatment plan with patient and/or surrogate as well as nursing, discussions with consultants, evaluation of patient's response to treatment, examination of patient, obtaining history from patient or surrogate, ordering and performing treatments and interventions, ordering and review of laboratory studies, ordering and review of radiographic studies, pulse oximetry and re-evaluation of patient's condition. Medications Ordered in ED Medications - No data to display   Initial Impression / Assessment and Plan / ED Course  I have reviewed the triage vital signs and the nursing notes.  Pertinent labs & imaging results that were available during my care of the patient were reviewed by me and considered in my medical decision making (see chart for details).  Clinical Course as of Feb 01 1038  Wed Feb 02, 2019  0800 With the patient's son.  He reports that she was up and down all night long.  She was going to the bathroom frequently but only very small amounts.  He reports that she has been confused and disoriented which is not typical for her.  I have requested he come to the emergency department to assist in her care and emotional support.  Will come within the next hour.   [MP]  [id number] Patient and son are updated on results.    [  MP]    Clinical Course User Index [MP] Charlesetta Shanks, MD   Patient started getting resistant to evaluation.  She had refused x-ray and labs.  She reported she wanted to go home.  She called her daughter.  I heard her having a long conversation with her daughter but she would not allow me to speak to her daughter.  Patient had already forgotten who I was and that I had evaluated her less than 20 minutes earlier.  She seems to forgotten the complete conversation that we had.  Spoke with the  patient's son and he confirmed that she is very confused.  Will need to proceed with diagnostic evaluation as patient currently does not have capacity for medical decision-making.   Patient son has arrived and is with the patient.  Reports that she was very confused for much of the night.  He reports she was visibly short of breath.  She does have findings consistent with CHF exacerbation.  He is tachypneic with increased work of breathing.  Lasix initiated IV, Metoprolol IV and AM dose given. Eliquis and potassium given.  Plan for admission.  Will need social services consult.  Final Clinical Impressions(s) / ED Diagnoses   Final diagnoses:  Acute on chronic congestive heart failure, unspecified heart failure type [facility])  Confusion    ED Discharge Orders    None       Charlesetta Shanks, MD 02/02/19 1043    Charlesetta Shanks, MD 02/02/19 1043

## 2019-02-02 NOTE — ED Triage Notes (Signed)
Pt presents from home BIB [facility]. Pt multiple complaints; per family pt altered x2-3d; pt has not voided in 1.5d; increase in edema plus weeping in lower legs per family; also increased SOB. Hx CHF, a fib, chronic 2L [place].

## 2019-02-02 NOTE — ED Notes (Signed)
Report given to Minette Brine, RN on 6E

## 2019-02-02 NOTE — ED Notes (Signed)
Pt had incontinence episode. Pt had urinated and bed was wet. Tech cleaned pt and bed. New sheets and chucks were put on bed.

## 2019-02-02 NOTE — ED Notes (Signed)
ED TO INPATIENT HANDOFF REPORT  ED Nurse Name and Phone #: Lorrin Goodell [phone]  S Name/Age/Gender Teresa [name] 78 y.o. female Room/Bed: 012C/012C  Code Status   Code Status: DNR  Home/SNF/Other Home Patient oriented to: self, place, time and situation Is this baseline? Yes   Triage Complete: Triage complete  Chief Complaint shob  Triage Note Pt presents from home BIB [facility]. Pt multiple complaints; per family pt altered x2-3d; pt has not voided in 1.5d; increase in edema plus weeping in lower legs per family; also increased SOB. Hx CHF, a fib, chronic 2L [place].    Allergies Allergies  Allergen Reactions  . Ambien [Zolpidem Tartrate] Other (See Comments)      Severe lethargy confusion and restlessness  . Aspirin Other (See Comments)    HX Bleeding Ulcer   . Nsaids Other (See Comments)    Hx of bleeding ulcers  . Tolmetin Rash    Hx of bleeding ulcers    Level of Care/Admitting Diagnosis ED Disposition    ED Disposition Condition [place]: [place] [[id number]]  Level of Care: Progressive [102]  Covid Evaluation: Asymptomatic Screening Protocol (No Symptoms)  Diagnosis: Acute on chronic systolic (congestive) heart failure [id number]  Admitting Physician: Karmen Bongo [[id number]]  Attending Physician: Karmen Bongo [[id number]]  Estimated length of stay: 3 - 4 days  Certification:: I certify this patient will need inpatient services for at least 2 midnights  PT Class (Do Not Modify): Inpatient [101]  PT Acc Code (Do Not Modify): Private [1]       B Medical/Surgery History Past Medical History:  Diagnosis Date  . Abdominal pain   . Anxiety    maintained on Xanax tid for years.  . Arthritis    FEET, KNEES, HANDS  . Atrial fibrillation ([place]) 09/2018  . Chronic systolic CHF (congestive heart failure), NYHA class 2 ([place])    EF 25% 2011 MV,  50-55% echo 6/13  . Diabetes mellitus type 2, diet-controlled ([place])   . GERD  (gastroesophageal reflux disease)   . Glaucoma    s/p surgery B in 30s.  [place].  . H/O hiatal hernia   . History of glaucoma   . History of small bowel obstruction 2009 AND NOV 2012  . HTN (hypertension)   . Hypothyroidism   . Left ovarian cyst   . Moderate mitral regurgitation   . Moderate tricuspid regurgitation   . Non-healing surgical wound    ABDOMINAL S/P EXPLORATOY LAPAROTOMY 04-18-2011  . Nonischemic cardiomyopathy ([place]) [facility]    EF is 25% per echo February 2012, non ischemic myoview 12/2009.  EF 50% echo 7/12 and plans for ICD cancelled.   . Peptic ulcer disease   . PVC (premature ventricular contraction)   . Spinal stenosis   . Urge urinary incontinence   . Valvular heart disease    Moderate to severe MR, moderate TR, LAE per echo 7/11  . Wears dentures    upper denture-lower partial   Past Surgical History:  Procedure Laterality Date  . ABDOMINAL HERNIA REPAIR  32 YRS AGO   PERITINITIS  . APPENDECTOMY  1979   RUPTURED  . BENIGN RIGHT BREAST TUMOR REMOVED    . CARDIOVASCULAR STRESS TEST  JULY 2011-  DR HOCHREIN   NO ISCHEMIA  . CARDIOVERSION N/A 08/17/2018   Procedure: CARDIOVERSION;  Surgeon: Sueanne Margarita, MD;  Location: [facility] ENDOSCOPY;  Service: Cardiovascular;  Laterality: N/A;  . EXPLORATOMY LAP. / EXTENSIVE  LYSIS ADHESIONS/ SMALL BOWEL RESECTION X2 WITH PRIMARY ANASTOMOSIS X2  04-18-2011   SMALL BOWEL OBSTRUCTION  . GLAUCOMA SURGERY  1978  . HERNIA REPAIR    . INCISION AND DRAINAGE OF WOUND N/A 09/13/2012   Procedure: INCISION  AND DEBRIDEMENT WOUND abdominal wall ;  Surgeon: Rolm Bookbinder, MD;  Location: [place];  Service: General;  Laterality: N/A;  coordination with Dr Migdalia Dk   . KNEE ARTHROSCOPY W/ MENISCECTOMY  06-05-2004  . TEE WITHOUT CARDIOVERSION N/A 08/17/2018   Procedure: TRANSESOPHAGEAL ECHOCARDIOGRAM (TEE);  Surgeon: Sueanne Margarita, MD;  Location[facility];  Service: Cardiovascular;  Laterality: N/A;  . THYROIDECTOMY  1978 GOITOR   AND  CERVICAL COLD KNIFE CONE BX  . TONSILLECTOMY  AGE 17  . TRANSTHORACIC ECHOCARDIOGRAM  12-27-2010   EF 45-50%/ MODERATE MV REGURG. / LEFT ATRIUM MILDLY DILATED/ MILD TRICUSPID REGURG.  . TUBAL LIGATION    . VENTRAL HERNIA REPAIR  X4  LAST ONE 20 YRS AGO   ABDOMINAL --  EACH ONE IN DIFFERENT AREA  . VENTRAL HERNIA REPAIR  01/29/2012   Procedure: HERNIA REPAIR VENTRAL ADULT;  Surgeon: Theodoro Kos, DO;  Location: [place];  Service: Plastics;  Laterality: N/A;  . WOUND DEBRIDEMENT  09/25/2011   Procedure: DEBRIDEMENT CLOSURE/ABDOMINAL WOUND;  Surgeon: Theodoro Kos, DO;  Location: [place];  Service: Plastics;  Laterality: N/A;  excision of abdominal wound with primary closure     A IV Location/Drains/Wounds Patient Lines/Drains/Airways Status   Active Line/Drains/Airways    Name:   Placement date:   Placement time:   Site:   Days:   Peripheral IV 02/02/19 Right Antecubital   02/02/19    0740    Antecubital   less than 1   Midline Single Lumen 01/04/19 Midline Right Cephalic 8 cm 0 cm   [id number]    [id number]    Cephalic   29   External Urinary Catheter   01/10/19    1950    -   23          Intake/Output Last 24 hours No intake or output data in the 24 hours ending 02/02/19 1356  Labs/Imaging Results for orders placed or performed during the hospital encounter of 02/02/19 (from the past 48 hour(s))  Comprehensive metabolic panel     Status: Abnormal   Collection Time: 02/02/19  6:55 AM  Result Value Ref Range   Sodium 138 135 - 145 mmol/L   Potassium 4.5 3.5 - 5.1 mmol/L   Chloride 98 98 - 111 mmol/L   CO2 23 22 - 32 mmol/L   Glucose, Bld 129 (H) 70 - 99 mg/dL   BUN 29 (H) 8 - 23 mg/dL   Creatinine, Ser 1.78 (H) 0.44 - 1.00 mg/dL   Calcium 9.2 8.9 - 10.3 mg/dL   Total Protein 6.3 (L) 6.5 - 8.1 g/dL   Albumin 3.3 (L) 3.5 - 5.0 g/dL   AST 29 15 - 41 U/L   ALT 28 0 - 44 U/L   Alkaline Phosphatase 189 (H) 38 - 126 U/L   Total Bilirubin 1.9 (H) 0.3 -  1.2 mg/dL   GFR calc non Af Amer 27 (L) >60 mL/min   GFR calc Af Amer 31 (L) >60 mL/min   Anion gap 17 (H) 5 - 15    Comment: Performed at [facility], [address], [place], [place] [address]  Brain natriuretic peptide     Status: Abnormal   Collection Time:  02/02/19  6:55 AM  Result Value Ref Range   B Natriuretic Peptide 1,480.7 (H) 0.0 - 100.0 pg/mL    Comment: Performed at [facility] [address], [place], [place] [address]  Troponin I (High Sensitivity)     Status: None   Collection Time: 02/02/19  6:55 AM  Result Value Ref Range   Troponin I (High Sensitivity) 17 <18 ng/L    Comment: (NOTE) Elevated high sensitivity troponin I (hsTnI) values and significant  changes across serial measurements may suggest ACS but many other  chronic and acute conditions are known to elevate hsTnI results.  Refer to the "Links" section for chest pain algorithms and additional  guidance. Performed at [facility], [place] [address] [place], [place] [address]   CBC with Differential     Status: Abnormal   Collection Time: 02/02/19  6:55 AM  Result Value Ref Range   WBC 8.0 4.0 - 10.5 K/uL   RBC 4.10 3.87 - 5.11 MIL/uL   Hemoglobin 12.4 12.0 - 15.0 g/dL   HCT 40.8 36.0 - 46.0 %   MCV 99.5 80.0 - 100.0 fL   MCH 30.2 26.0 - 34.0 pg   MCHC 30.4 30.0 - 36.0 g/dL   RDW 18.0 (H) 11.5 - 15.5 %   Platelets 189 150 - 400 K/uL   nRBC 0.0 0.0 - 0.2 %   Neutrophils Relative % 66 %   Neutro Abs 5.3 1.7 - 7.7 K/uL   Lymphocytes Relative 19 %   Lymphs Abs 1.6 0.7 - 4.0 K/uL   Monocytes Relative 11 %   Monocytes Absolute 0.9 0.1 - 1.0 K/uL   Eosinophils Relative 1 %   Eosinophils Absolute 0.1 0.0 - 0.5 K/uL   Basophils Relative 1 %   Basophils Absolute 0.1 0.0 - 0.1 K/uL   Immature Granulocytes 2 %   Abs Immature Granulocytes 0.16 (H) 0.00 - 0.07 K/uL    Comment: Performed at [facility] [address], [place], [place] [address]  Protime-INR     Status: Abnormal    Collection Time: 02/02/19  6:55 AM  Result Value Ref Range   Prothrombin Time 24.1 (H) 11.4 - 15.2 seconds   INR 2.2 (H) 0.8 - 1.2    Comment: (NOTE) INR goal varies based on device and disease states. Performed at [facility], [place] [address] [place], [place] [address]   Ammonia     Status: None   Collection Time: 02/02/19  7:41 AM  Result Value Ref Range   Ammonia 25 9 - 35 umol/L    Comment: Performed at [facility], [place] [address] [place], [place] [address]  Troponin I (High Sensitivity)     Status: None   Collection Time: 02/02/19  9:26 AM  Result Value Ref Range   Troponin I (High Sensitivity) 16 <18 ng/L    Comment: (NOTE) Elevated high sensitivity troponin I (hsTnI) values and significant  changes across serial measurements may suggest ACS but many other  chronic and acute conditions are known to elevate hsTnI results.  Refer to the "Links" section for chest pain algorithms and additional  guidance. Performed at [facility], [place] [address] [place], [place] [address]   Urinalysis, Routine w reflex microscopic     Status: None   Collection Time: 02/02/19  9:55 AM  Result Value Ref Range   Color, Urine YELLOW YELLOW   APPearance CLEAR CLEAR   Specific Gravity, Urine  1.010 1.005 - 1.030   pH 6.0 5.0 - 8.0   Glucose, UA NEGATIVE NEGATIVE mg/dL   Hgb urine dipstick NEGATIVE NEGATIVE   Bilirubin Urine NEGATIVE NEGATIVE   Ketones, ur NEGATIVE NEGATIVE mg/dL   Protein, ur NEGATIVE NEGATIVE mg/dL   Nitrite NEGATIVE NEGATIVE   Leukocytes,Ua NEGATIVE NEGATIVE    Comment: Performed at [facility] [address] [place], [place] [address]  SARS Coronavirus 2 [facility] order, Performed in [facility] hospital lab) Nasopharyngeal Nasopharyngeal Swab     Status: None   Collection Time: 02/02/19 11:20 AM   Specimen: Nasopharyngeal Swab  Result Value Ref Range   SARS Coronavirus 2 NEGATIVE NEGATIVE    Comment: (NOTE) If result is  NEGATIVE SARS-CoV-2 target nucleic acids are NOT DETECTED. The SARS-CoV-2 RNA is generally detectable in upper and lower  respiratory specimens during the acute phase of infection. The lowest  concentration of SARS-CoV-2 viral copies this assay can detect is 250  copies / mL. A negative result does not preclude SARS-CoV-2 infection  and should not be used as the sole basis for treatment or other  patient management decisions.  A negative result may occur with  improper specimen collection / handling, submission of specimen other  than nasopharyngeal swab, presence of viral mutation(s) within the  areas targeted by this assay, and inadequate number of viral copies  (<250 copies / mL). A negative result must be combined with clinical  observations, patient history, and epidemiological information. If result is POSITIVE SARS-CoV-2 target nucleic acids are DETECTED. The SARS-CoV-2 RNA is generally detectable in upper and lower  respiratory specimens dur ing the acute phase of infection.  Positive  results are indicative of active infection with SARS-CoV-2.  Clinical  correlation with patient history and other diagnostic information is  necessary to determine patient infection status.  Positive results do  not rule out bacterial infection or co-infection with other viruses. If result is PRESUMPTIVE POSTIVE SARS-CoV-2 nucleic acids MAY BE PRESENT.   A presumptive positive result was obtained on the submitted specimen  and confirmed on repeat testing.  While 2019 novel coronavirus  (SARS-CoV-2) nucleic acids may be present in the submitted sample  additional confirmatory testing may be necessary for epidemiological  and / or clinical management purposes  to differentiate between  SARS-CoV-2 and other Sarbecovirus currently known to infect humans.  If clinically indicated additional testing with an alternate test  methodology [phone]) is advised. The SARS-CoV-2 RNA is generally  detectable  in upper and lower respiratory sp ecimens during the acute  phase of infection. The expected result is Negative. Fact Sheet for Patients:  StrictlyIdeas.no Fact Sheet for Healthcare Providers: BankingDealers.co.za This test is not yet approved or cleared by the [place] FDA and has been authorized for detection and/or diagnosis of SARS-CoV-2 by FDA under an Emergency Use Authorization (EUA).  This EUA will remain in effect (meaning this test can be used) for the duration of the COVID-19 declaration under Section 564(b)(1) of the Act, 21 U.S.C. section 360bbb-3(b)(1), unless the authorization is terminated or revoked sooner. Performed at [facility], [place] [address], [place], [place] [address]    Dg Chest Port 1 View  Result Date: 02/02/2019 CLINICAL DATA:  Altered for 2-3 days.  Increased edema. EXAM: PORTABLE CHEST 1 VIEW COMPARISON:  01/04/2019 FINDINGS: Bilateral small pleural effusions. Bilateral diffuse interstitial thickening. No pneumothorax. Stable cardiomegaly. No acute osseous abnormality. IMPRESSION: Findings concerning for mild CHF. Electronically Signed   By:  Kathreen Devoid   On: 02/02/2019 10:03    Pending Labs Unresulted Labs (From admission, onward)    Start     Ordered   02/03/19 [id number]  Basic metabolic panel  Daily,   R     02/02/19 1233   02/03/19 0000  CBC WITH DIFFERENTIAL  Tomorrow morning,   R     02/02/19 1233          Vitals/Pain Today's Vitals   02/02/19 1100 02/02/19 1115 02/02/19 1130 02/02/19 1200  BP: 111/84  103/72 104/85  Pulse: (!) 120 (!) 107    Resp: (!) 23 (!) 21 (!) 22 (!) 24  Temp:      TempSrc:      SpO2: 99% 96%    Weight:      Height:      PainSc:        Isolation Precautions No active isolations  Medications Medications  HYDROcodone-acetaminophen (NORCO/VICODIN) 5-325 MG per tablet 1 tablet (has no administration in time range)  metoprolol succinate (TOPROL-XL) 24 hr  tablet 50 mg (has no administration in time range)  busPIRone (BUSPAR) tablet 7.5 mg (has no administration in time range)  citalopram (CELEXA) tablet 20 mg (has no administration in time range)  levothyroxine (SYNTHROID) tablet 100 mcg (has no administration in time range)  polyethylene glycol (MIRALAX / GLYCOLAX) packet 17 g (has no administration in time range)  apixaban (ELIQUIS) tablet 5 mg (has no administration in time range)  Melatonin TABS 3 mg (has no administration in time range)  clonazepam (KLONOPIN) disintegrating tablet 0.25 mg (has no administration in time range)  magnesium oxide (MAG-OX) tablet 200 mg (has no administration in time range)  sodium chloride flush (NS) 0.9 % injection 3 mL (has no administration in time range)  sodium chloride flush (NS) 0.9 % injection 3 mL (has no administration in time range)  0.9 %  sodium chloride infusion (has no administration in time range)  acetaminophen (TYLENOL) tablet 650 mg (has no administration in time range)  diltiazem (CARDIZEM) 100 mg in dextrose 5 % 100 mL (1 mg/mL) infusion (has no administration in time range)  furosemide (LASIX) injection 40 mg (has no administration in time range)  nystatin (MYCOSTATIN/NYSTOP) topical powder (has no administration in time range)  metoprolol tartrate (LOPRESSOR) injection 5 mg (5 mg Intravenous Given 02/02/19 0906)  furosemide (LASIX) injection 40 mg (40 mg Intravenous Given 02/02/19 0906)  potassium chloride SA (K-DUR) CR tablet 40 mEq (40 mEq Oral Given 02/02/19 0905)  apixaban (ELIQUIS) tablet 5 mg (5 mg Oral Given 02/02/19 1212)    Mobility walks with device High fall risk   Focused Assessments Cardiac Assessment Handoff:  Cardiac Rhythm: Atrial fibrillation Lab Results  Component Value Date   CKTOTAL 27 05/21/2016   CKMB 3.8 04/14/2008   TROPONINI 0.03 (HH) 10/13/2018   No results found for: DDIMER Does the Patient currently have chest pain? No     R Recommendations: See  Admitting Provider Note  Report given to:   Additional Notes:

## 2019-02-02 NOTE — ED Notes (Signed)
Afib on monitor

## 2019-02-03 ENCOUNTER — Other Ambulatory Visit: Payer: Self-pay

## 2019-02-03 ENCOUNTER — Encounter (HOSPITAL_COMMUNITY): Payer: Self-pay | Admitting: General Practice

## 2019-02-03 LAB — BASIC METABOLIC PANEL
Anion gap: 18 — ABNORMAL HIGH (ref 5–15)
BUN: 32 mg/dL — ABNORMAL HIGH (ref 8–23)
CO2: 20 mmol/L — ABNORMAL LOW (ref 22–32)
Calcium: 9 mg/dL (ref 8.9–10.3)
Chloride: 97 mmol/L — ABNORMAL LOW (ref 98–111)
Creatinine, Ser: 2.11 mg/dL — ABNORMAL HIGH (ref 0.44–1.00)
GFR calc Af Amer: 25 mL/min — ABNORMAL LOW (ref >60)
GFR calc non Af Amer: 22 mL/min — ABNORMAL LOW (ref >60)
Glucose, Bld: 83 mg/dL (ref 70–99)
Potassium: 5.4 mmol/L — ABNORMAL HIGH (ref 3.5–5.1)
Sodium: 135 mmol/L (ref 135–145)

## 2019-02-03 LAB — CBC WITH DIFFERENTIAL/PLATELET
Abs Immature Granulocytes: 0.26 10*3/uL — ABNORMAL HIGH (ref 0.00–0.07)
Basophils Absolute: 0.1 10*3/uL (ref 0.0–0.1)
Basophils Relative: 1 %
Eosinophils Absolute: 0.1 10*3/uL (ref 0.0–0.5)
Eosinophils Relative: 1 %
HCT: 36.3 % (ref 36.0–46.0)
Hemoglobin: 11.4 g/dL — ABNORMAL LOW (ref 12.0–15.0)
Immature Granulocytes: 3 %
Lymphocytes Relative: 19 %
Lymphs Abs: 1.7 10*3/uL (ref 0.7–4.0)
MCH: 30.4 pg (ref 26.0–34.0)
MCHC: 31.4 g/dL (ref 30.0–36.0)
MCV: 96.8 fL (ref 80.0–100.0)
Monocytes Absolute: 1.2 10*3/uL — ABNORMAL HIGH (ref 0.1–1.0)
Monocytes Relative: 14 %
Neutro Abs: 5.4 10*3/uL (ref 1.7–7.7)
Neutrophils Relative %: 62 %
Platelets: 164 10*3/uL (ref 150–400)
RBC: 3.75 MIL/uL — ABNORMAL LOW (ref 3.87–5.11)
RDW: 18 % — ABNORMAL HIGH (ref 11.5–15.5)
WBC: 8.7 10*3/uL (ref 4.0–10.5)
nRBC: 0.2 % (ref 0.0–0.2)

## 2019-02-03 NOTE — Progress Notes (Signed)
Occupational Therapy Evaluation Patient Details Name: Teresa [name] MRN: [id number] DOB: 12/28/1940 Today's Date: 02/03/2019    History of Present Illness ANNALEAH [name] is a 78 y.o. 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 She was admitted to the [facility] service from 7/14-29 and discharged to SNF; she has since returned home.    Clinical Impression   PTA, pt was living at home with her son, who assisted with ADL/IADL and functional mobility at RW level. Pt was recently d/c from SNF to home and has been deconditioning since returning home, per chart. Pt currently requires modA+1 for initial stand-pivot from EOB to [facility], requires totalA for toileting hygiene, and progressing to modA+2 for stand-pivot from [facility] to recliner. Due to decline in current level of function, pt would benefit from acute OT to address established goals to facilitate safe D/C to venue listed below. At this time, recommend SNF follow-up. Will continue to follow acutely.     Follow Up Recommendations  SNF    Equipment Recommendations  3 in 1 bedside commode;Tub/shower seat    Recommendations for Other Services       Precautions / Restrictions Precautions Precautions: Fall Restrictions Weight Bearing Restrictions: No      Mobility Bed Mobility Overal bed mobility: Needs Assistance Bed Mobility: Supine to Sit     Supine to sit: Mod assist;HOB elevated     General bed mobility comments: modA for BLE management, elevate trunk, and scoot hips to EOB  Transfers Overall transfer level: Needs assistance Equipment used: Rolling walker (2 wheeled) Transfers: Sit to/from [facility] Sit to Stand: Mod assist Stand pivot transfers: Mod assist;+2 safety/equipment;+2 physical  assistance       General transfer comment: initial stand-pivot modA+1 from EOB to BSC;pt with decreased activity tolerance requiring modA+2 for second stand-pivot from [facility] to recliner    Balance Overall balance assessment: Needs assistance Sitting-balance support: No upper extremity supported;Feet supported Sitting balance-Leahy Scale: Fair     Standing balance support: Bilateral upper extremity supported Standing balance-Leahy Scale: Poor Standing balance comment: heavy reliance on BUE support                           ADL either performed or assessed with clinical judgement   ADL Overall ADL's : Needs assistance/impaired Eating/Feeding: Set up;Sitting   Grooming: Min guard;Sitting   Upper Body Bathing: Min guard;Sitting   Lower Body Bathing: Total assistance;Moderate assistance;Sit to/from stand Lower Body Bathing Details (indicate cue type and reason): totalA to access BLE;modA for sit<>stand Upper Body Dressing : Min guard;Sitting   Lower Body Dressing: Moderate assistance;Sit to/from stand   Toilet Transfer: Moderate assistance;+2 for safety/equipment;Stand-pivot Toilet Transfer Details (indicate cue type and reason): stand pivot from EOB to [facility] with modA+1 progressing to modA+2 for stand pivot from [facility] to recliner Toileting- Clothing Manipulation and Hygiene: Total assistance;Moderate assistance;Sit to/from stand Toileting - Clothing Manipulation Details (indicate cue type and reason): totalA for posterior pericare following BM; modA for sit<>stand     Functional mobility during ADLs: +2 for physical assistance;Moderate assistance;Rolling walker General ADL Comments: decreased activity tolerance;modA for initial stand-pivot progressing to modA+2 for stand-pivot with a few steps;HR briefly tachy 121 following transfer;spO2 98% 2L HFNC     Vision Baseline Vision/History: Wears glasses Wears Glasses: At  all times Patient Visual Report: No change from  baseline       Perception     Praxis      Pertinent Vitals/Pain Pain Assessment: No/denies pain Pain Intervention(s): Monitored during session     Hand Dominance Right   Extremity/Trunk Assessment Upper Extremity Assessment Upper Extremity Assessment: Generalized weakness   Lower Extremity Assessment Lower Extremity Assessment: Defer to PT evaluation   Cervical / Trunk Assessment Cervical / Trunk Assessment: Kyphotic   Communication Communication Communication: HOH   Cognition Arousal/Alertness: Awake/alert Behavior During Therapy: WFL for tasks assessed/performed Overall Cognitive Status: No family/caregiver present to determine baseline cognitive functioning                                 General Comments: WFL for simple tasks assessed, decreased awareness of deficits and safety (may be baseline);per chart, pt appeared to demonstrate sundowning like symptoms at [facility]   General Comments       Exercises     Shoulder Instructions      Home Living Family/patient expects to be discharged to:: Private residence Living Arrangements: Alone Available Help at Discharge: Family(pt reports family can be there 24/7 but isn't always) Type of Home: House Home Access: Level entry     Home Layout: One level     Bathroom Shower/Tub: Teacher, early years/pre: Standard Bathroom Accessibility: Yes How Accessible: Accessible via walker Home Equipment: Cane - single point;Grab bars - toilet;Toilet riser;Tub bench;Bedside commode;Walker - 2 wheels   Additional Comments: Information from prior encounter      Prior Functioning/Environment Level of Independence: Needs assistance  Gait / Transfers Assistance Needed: amb short distance with walker. Unsure if unassisted or with assist ADL's / Homemaking Assistance Needed: pt reports son has been assisting with all IADL/ADL.    Comments:  per chart, lives with son since d/c from rehab but it is too much for  her son         OT Problem List: Decreased strength;Decreased range of motion;Decreased activity tolerance;Impaired balance (sitting and/or standing);Decreased cognition;Decreased safety awareness;Decreased knowledge of use of DME or AE;Decreased knowledge of precautions;Cardiopulmonary status limiting activity;Obesity      OT Treatment/Interventions: Self-care/ADL training;Therapeutic exercise;Energy conservation;DME and/or AE instruction;Therapeutic activities;Cognitive remediation/compensation;Patient/family education;Balance training    OT Goals(Current goals can be found in the care plan section) Acute Rehab OT Goals Patient Stated Goal: to go home OT Goal Formulation: With patient Time For Goal Achievement: 02/17/19 Potential to Achieve Goals: Good ADL Goals Pt Will Perform Grooming: with supervision;sitting Pt Will Perform Upper Body Dressing: with supervision;sitting Pt Will Perform Lower Body Dressing: with adaptive equipment;with min assist Pt Will Transfer to Toilet: ambulating;with min guard assist Pt Will Perform Toileting - Clothing Manipulation and hygiene: with min guard assist;sit to/from stand  OT Frequency: Min 2X/week   Barriers to D/C: Decreased caregiver support  per chart, pt's level of support too much for pt's son       Co-evaluation PT/OT/SLP Co-Evaluation/Treatment: Yes Reason for Co-Treatment: For patient/therapist safety;To address functional/ADL transfers   OT goals addressed during session: ADL's and self-care      AM-PAC OT "6 Clicks" Daily Activity     Outcome Measure Help from another person eating meals?: A Little Help from another person taking care of personal grooming?: A Little Help from another person toileting, which includes using toliet, bedpan, or urinal?: A Lot Help from another person bathing (including washing, rinsing,  drying)?: A Lot Help from another person to put on and taking off regular upper body clothing?: A Little Help  from another person to put on and taking off regular lower body clothing?: A Lot 6 Click Score: 15   End of Session Equipment Utilized During Treatment: Gait belt;Rolling walker;Oxygen(2LHFNC) Nurse Communication: Mobility status  Activity Tolerance: Patient tolerated treatment well Patient left: in chair;with call bell/phone within reach;with chair alarm set  OT Visit Diagnosis: Unsteadiness on feet (R26.81);Other abnormalities of gait and mobility (R26.89);Muscle weakness (generalized) (M62.81);Feeding difficulties (R63.3);Other symptoms and signs involving cognitive function                Time: [id number] OT Time Calculation (min): 34 min Charges:  OT General Charges $OT Visit: 1 Visit OT Evaluation $OT Eval Moderate Complexity: [place] OTR/L Acute Rehabilitation Services Office: [place] 02/03/2019, 10:20 AM

## 2019-02-03 NOTE — Progress Notes (Addendum)
PROGRESS NOTE    Teresa [name]  [id number] DOB: 1940-06-26 DOA: 02/02/2019 PCP: [name], MD  Brief Narrative: 78 year old chronically ill female with chronic systolic CHF, EF of 61%, severe MR, moderate TR, type 2 diabetes mellitus, stage III chronic chronic kidney disease, paroxysmal atrial fibrillation on Eliquis, CAD, hypothyroidism, multiple frequent hospitalizations with CHF was recently hospitalized and discharged few weeks ago with CHF exacerbation complicated by acute kidney injury. -Went to rehab and then went home 5 days ago, subsequently continued to decline with worsening dyspnea, hypoxemia, physical debility and decline, family was unable to provide the level of care that was needed for her at home and brought her to the emergency room where she was found to have acute on chronic systolic CHF again Assessment & Plan:   Acute on chronic systolic CHF exacerbation -Frequent recurrent hospitalizations for same, EF is 20% with severe MR and moderate TR, -Has been seen by palliative care multiple times, palliative focused care recommended by cardiology during recent hospitalization as well -Continue IV Lasix today 40 mg every 12, monitor urine output and kidney function closely -Continue Toprol-XL -Recent echo in July with EF of 20 to 25%, severe MR -Called and discussed with son, continue supportive care, conservative management only at this point with goal to transition to SNF with palliative care follow-up when medically optimized  Atrial Fibrillation:   -Was on Cardizem drip overnight, this was discontinued after her blood pressure dropped last night -Resume oral Cardizem, continue Eliquis  AKI on stage 3 CKD -Baseline creatinine around 1.8-2 range, it was 1.7 recently at discharge which is better than her recent baseline, up to 2.1 this morning -Likely cardiorenal -Monitor with diuresis -B met daily  HTN -Continue Toprol XL  DM -Diet controlled -Will  monitor with fasting labs for now  Anxiety/depression/dementia -Patient suffered loss of her daughter in July and has been declining since then -She has underlying depression/anxiety and takes Buspar,, Klonopin -Based on prior hospitalization and son's report, strongly suspect underlying dementia that has been unmasked by recent life stressors and medical problems -She is pleasantly confused at this time, but apparently experienced significant sundowning while in SNF and home  Hypothyroidism -Continue Synthroid at current dose  Deconditioning -Patient went to rehab following last hospitalization -She lived alone until march -She is now too difficult to care for in her son's home and he reports that she will need placement -SW consulted, PT/OT consulted   DVT prophylaxis: Lovenox  Code Status:  DNR - confirmed with son Family Communication: Son was present throughout evaluation Disposition Plan:   Likely SNF for rehab   Consultants:      Procedures:   Antimicrobials:    Subjective: -Drop in blood pressure last night, -Reports feeling a little better this morning, still complains of dyspnea with minimal activity  Objective: Vitals:   02/03/19 0600 02/03/19 0640 02/03/19 0643 02/03/19 1055  BP: 91/77 (!) 109/59  91/69  Pulse:  (!) 109  (!) 106  Resp: 20 (!) 24    Temp:  97.6 F (36.4 C)    TempSrc:  Oral    SpO2:  95%    Weight:   93 kg   Height:        Intake/Output Summary (Last 24 hours) at 02/03/2019 1112 Last data filed at 02/03/2019 0643 Gross per 24 hour  Intake 268.31 ml  Output 300 ml  Net -31.69 ml   Filed Weights   02/02/19 0924 02/03/19 0643  Weight: 93 kg  93 kg    Examination:  Gen: Awake alert, oriented to self and partly to place only HEENT: PERRLA, Neck supple, positive JVD Lungs: Fine bibasilar crackles  CVS: RRR,No Gallops,Rubs or new Murmurs Abd: soft, Non tender, non distended, BS present Extremities: 2+ edema Skin: no new  rashes Psychiatry: Poor insight and judgment     Data Reviewed:   CBC: Recent Labs  Lab 02/02/19 0655 02/03/19 0040  WBC 8.0 8.7  NEUTROABS 5.3 5.4  HGB 12.4 11.4*  HCT 40.8 36.3  MCV 99.5 96.8  PLT 189 599   Basic Metabolic Panel: Recent Labs  Lab 02/02/19 0655 02/03/19 0040  NA 138 135  K 4.5 5.4*  CL 98 97*  CO2 23 20*  GLUCOSE 129* 83  BUN 29* 32*  CREATININE 1.78* 2.11*  CALCIUM 9.2 9.0   GFR: Estimated Creatinine Clearance: 23.8 mL/min (A) (by C-G formula based on SCr of 2.11 mg/dL (H)). Liver Function Tests: Recent Labs  Lab 02/02/19 0655  AST 29  ALT 28  ALKPHOS 189*  BILITOT 1.9*  PROT 6.3*  ALBUMIN 3.3*   No results for input(s): LIPASE, AMYLASE in the last 168 hours. Recent Labs  Lab 02/02/19 0741  AMMONIA 25   Coagulation Profile: Recent Labs  Lab 02/02/19 0655  INR 2.2*   Cardiac Enzymes: No results for input(s): CKTOTAL, CKMB, CKMBINDEX, TROPONINI in the last 168 hours. BNP (last 3 results) No results for input(s): PROBNP in the last 8760 hours. HbA1C: No results for input(s): HGBA1C in the last 72 hours. CBG: No results for input(s): GLUCAP in the last 168 hours. Lipid Profile: No results for input(s): CHOL, HDL, LDLCALC, TRIG, CHOLHDL, LDLDIRECT in the last 72 hours. Thyroid Function Tests: No results for input(s): TSH, T4TOTAL, FREET4, T3FREE, THYROIDAB in the last 72 hours. Anemia Panel: No results for input(s): VITAMINB12, FOLATE, FERRITIN, TIBC, IRON, RETICCTPCT in the last 72 hours. Urine analysis:    Component Value Date/Time   COLORURINE YELLOW 02/02/2019 0955   APPEARANCEUR CLEAR 02/02/2019 0955   APPEARANCEUR Clear 12/21/2017 1824   LABSPEC 1.010 02/02/2019 0955   PHURINE 6.0 02/02/2019 0955   GLUCOSEU NEGATIVE 02/02/2019 0955   HGBUR NEGATIVE 02/02/2019 0955   BILIRUBINUR NEGATIVE 02/02/2019 0955   BILIRUBINUR Negative 12/21/2017 1824   KETONESUR NEGATIVE 02/02/2019 0955   PROTEINUR NEGATIVE 02/02/2019 0955    UROBILINOGEN 0.2 12/23/2016 1317   UROBILINOGEN 0.2 05/27/2011 1835   NITRITE NEGATIVE 02/02/2019 0955   LEUKOCYTESUR NEGATIVE 02/02/2019 0955   Sepsis Labs: '@LABRCNTIP' (procalcitonin:4,lacticidven:4)  ) Recent Results (from the past 240 hour(s))  SARS Coronavirus 2 [facility] order, Performed in [facility] hospital lab) Nasopharyngeal Nasopharyngeal Swab     Status: None   Collection Time: 02/02/19 11:20 AM   Specimen: Nasopharyngeal Swab  Result Value Ref Range Status   SARS Coronavirus 2 NEGATIVE NEGATIVE Final    Comment: (NOTE) If result is NEGATIVE SARS-CoV-2 target nucleic acids are NOT DETECTED. The SARS-CoV-2 RNA is generally detectable in upper and lower  respiratory specimens during the acute phase of infection. The lowest  concentration of SARS-CoV-2 viral copies this assay can detect is 250  copies / mL. A negative result does not preclude SARS-CoV-2 infection  and should not be used as the sole basis for treatment or other  patient management decisions.  A negative result may occur with  improper specimen collection / handling, submission of specimen other  than nasopharyngeal swab, presence of viral mutation(s) within the  areas targeted by this assay, and  inadequate number of viral copies  (<250 copies / mL). A negative result must be combined with clinical  observations, patient history, and epidemiological information. If result is POSITIVE SARS-CoV-2 target nucleic acids are DETECTED. The SARS-CoV-2 RNA is generally detectable in upper and lower  respiratory specimens dur ing the acute phase of infection.  Positive  results are indicative of active infection with SARS-CoV-2.  Clinical  correlation with patient history and other diagnostic information is  necessary to determine patient infection status.  Positive results do  not rule out bacterial infection or co-infection with other viruses. If result is PRESUMPTIVE POSTIVE SARS-CoV-2 nucleic acids MAY BE  PRESENT.   A presumptive positive result was obtained on the submitted specimen  and confirmed on repeat testing.  While 2019 novel coronavirus  (SARS-CoV-2) nucleic acids may be present in the submitted sample  additional confirmatory testing may be necessary for epidemiological  and / or clinical management purposes  to differentiate between  SARS-CoV-2 and other Sarbecovirus currently known to infect humans.  If clinically indicated additional testing with an alternate test  methodology [phone]) is advised. The SARS-CoV-2 RNA is generally  detectable in upper and lower respiratory sp ecimens during the acute  phase of infection. The expected result is Negative. Fact Sheet for Patients:  StrictlyIdeas.no Fact Sheet for Healthcare Providers: BankingDealers.co.za This test is not yet approved or cleared by the [place] FDA and has been authorized for detection and/or diagnosis of SARS-CoV-2 by FDA under an Emergency Use Authorization (EUA).  This EUA will remain in effect (meaning this test can be used) for the duration of the COVID-19 declaration under Section 564(b)(1) of the Act, 21 U.S.C. section 360bbb-3(b)(1), unless the authorization is terminated or revoked sooner. Performed at [facility], [place] [address], [place], [place] [address]          Radiology Studies: Dg Chest Port 1 View  Result Date: 02/02/2019 CLINICAL DATA:  Altered for 2-3 days.  Increased edema. EXAM: PORTABLE CHEST 1 VIEW COMPARISON:  01/04/2019 FINDINGS: Bilateral small pleural effusions. Bilateral diffuse interstitial thickening. No pneumothorax. Stable cardiomegaly. No acute osseous abnormality. IMPRESSION: Findings concerning for mild CHF. Electronically Signed   By: Kathreen Devoid   On: 02/02/2019 10:03        Scheduled Meds: . apixaban  5 mg Oral BID  . busPIRone  7.5 mg Oral Daily  . citalopram  20 mg Oral Daily  . furosemide  40 mg  Intravenous Q12H  . levothyroxine  100 mcg Oral QAC breakfast  . magnesium oxide  200 mg Oral BID  . metoprolol succinate  50 mg Oral Daily  . nystatin   Topical BID  . sodium chloride flush  3 mL Intravenous Q12H   Continuous Infusions: . sodium chloride    . diltiazem (CARDIZEM) infusion Stopped (02/03/19 0429)     LOS: 1 day    Time spent: 46mn    PDomenic Polite MD [facility] Page via [email or web address], password [email or web address] After 7PM please contact night-coverage  02/03/2019, 11:12 AM

## 2019-02-03 NOTE — Discharge Instructions (Signed)

## 2019-02-03 NOTE — Progress Notes (Signed)
Pt uncooperative confused agitated and pleading to sit upright with I/O cath, unable to seed catheter in good position for adequate return of urine. Catheter removed, no vaginal/urethral trauma noted.

## 2019-02-03 NOTE — Progress Notes (Signed)
Pt anxious confused verbalizes seeing "bugs on the wall". Pt able to be redirected. Will continue to reorient and reassure pt as needed.

## 2019-02-03 NOTE — Evaluation (Signed)
Physical Therapy Evaluation Patient Details Name: Teresa [name] MRN: [id number] DOB: 1941/05/18 Today's Date: 02/03/2019   History of Present Illness  Teresa [name] is a 78 y.o. 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 She was admitted to the [facility] service from 7/14-29 and discharged to SNF; she has since returned home.   Clinical Impression  Pt known to PT from prior hospitalization. Pt poor historian however from prior notes pt went to SNF before going home where son provided 24 hour care until this hospitalization. Pt is limited in safe mobility by decreased cognition especially in afternoon (from notes) as well as decreased strength, balance and endurance. Pt requires modA for bed mobility, modAx2 for transfers and modA for ambulation of 4 feet prior to pt fatigue. PT recommending SNF level rehab at discharge. PT will continue to follow acutely.    Follow Up Recommendations SNF    Equipment Recommendations  Other (comment)(TBD at next venue)       Precautions / Restrictions Precautions Precautions: Fall Restrictions Weight Bearing Restrictions: No      Mobility  Bed Mobility Overal bed mobility: Needs Assistance Bed Mobility: Supine to Sit     Supine to sit: Mod assist;HOB elevated     General bed mobility comments: modA for BLE management, elevate trunk, and scoot hips to EOB  Transfers Overall transfer level: Needs assistance Equipment used: Rolling walker (2 wheeled) Transfers: Sit to/from [facility] Sit to Stand: Mod assist Stand pivot transfers: Mod assist;+2 safety/equipment;+2 physical assistance       General transfer comment: initial stand-pivot modA+1 from EOB to BSC;pt with decreased activity tolerance requiring modA+2  for second stand-pivot from [facility] to recliner  Ambulation/Gait Ambulation/Gait assistance: Mod assist Gait Distance (Feet): 4 Feet Assistive device: Rolling walker (2 wheeled) Gait Pattern/deviations: Step-to pattern;Decreased step length - right;Decreased step length - left;Shuffle;Trunk flexed Gait velocity: slowed Gait velocity interpretation: <1.31 ft/sec, indicative of household ambulator General Gait Details: modA for support and balance, vc for upright posture and proximity to RW      Balance Overall balance assessment: Needs assistance Sitting-balance support: No upper extremity supported;Feet supported Sitting balance-Leahy Scale: Fair     Standing balance support: Bilateral upper extremity supported Standing balance-Leahy Scale: Poor Standing balance comment: heavy reliance on BUE support                             Pertinent Vitals/Pain Pain Assessment: No/denies pain Pain Intervention(s): Monitored during session    Home Living Family/patient expects to be discharged to:: Private residence Living Arrangements: Alone Available Help at Discharge: Family(pt reports family can be there 24/7 but isn't always) Type of Home: House Home Access: Level entry     Home Layout: One level Home Equipment: Cane - single point;Grab bars - toilet;Toilet riser;Tub bench;Bedside commode;Walker - 2 wheels Additional Comments: Information from prior encounter    Prior Function Level of Independence: Needs assistance   Gait / Transfers Assistance Needed: amb short distance with walker. Unsure if unassisted or with assist  ADL's / Homemaking Assistance Needed: pt reports son has been assisting with all IADL/ADL.   Comments:  per chart, lives with son since d/c from rehab but it is too much for her son      Hand Dominance  Dominant Hand: Right    Extremity/Trunk Assessment   Upper Extremity Assessment Upper Extremity Assessment: Defer to OT evaluation    Lower  Extremity Assessment Lower Extremity Assessment: Generalized weakness    Cervical / Trunk Assessment Cervical / Trunk Assessment: Kyphotic  Communication   Communication: HOH  Cognition Arousal/Alertness: Awake/alert Behavior During Therapy: WFL for tasks assessed/performed Overall Cognitive Status: No family/caregiver present to determine baseline cognitive functioning                                 General Comments: WFL for simple tasks assessed, decreased awareness of deficits and safety (may be baseline);per chart, pt appeared to demonstrate sundowning like symptoms at SNF      General Comments General comments (skin integrity, edema, etc.): VSS, SaO2 on 2L O2 >90%O2 throughout session         Assessment/Plan    PT Assessment Patient needs continued PT services  PT Problem List Decreased strength;Decreased activity tolerance;Decreased balance;Decreased mobility;Decreased safety awareness       PT Treatment Interventions DME instruction;Gait training;Functional mobility training;Therapeutic activities;Therapeutic exercise;Balance training;Cognitive remediation;Patient/family education    PT Goals (Current goals can be found in the Care Plan section)  Acute Rehab PT Goals Patient Stated Goal: to go home PT Goal Formulation: With patient Time For Goal Achievement: 02/17/19 Potential to Achieve Goals: Fair    Frequency Min 2X/week        Co-evaluation PT/OT/SLP Co-Evaluation/Treatment: Yes Reason for Co-Treatment: For patient/therapist safety PT goals addressed during session: Mobility/safety with mobility;Balance         AM-PAC PT "6 Clicks" Mobility  Outcome Measure Help needed turning from your back to your side while in a flat bed without using bedrails?: A Little Help needed moving from lying on your back to sitting on the side of a flat bed without using bedrails?: A Lot Help needed moving to and from a bed to a chair (including a  wheelchair)?: A Lot Help needed standing up from a chair using your arms (e.g., wheelchair or bedside chair)?: A Lot Help needed to walk in hospital room?: Total Help needed climbing 3-5 steps with a railing? : Total 6 Click Score: 11    End of Session Equipment Utilized During Treatment: Gait belt;Oxygen Activity Tolerance: Patient tolerated treatment well Patient left: in chair;with call bell/phone within reach;with chair alarm set Nurse Communication: Mobility status PT Visit Diagnosis: Unsteadiness on feet (R26.81);Other abnormalities of gait and mobility (R26.89);Muscle weakness (generalized) (M62.81);Difficulty in walking, not elsewhere classified (R26.2)    Time: 0929-1000 PT Time Calculation (min) (ACUTE ONLY): 31 min   Charges:   PT Evaluation $PT Eval Moderate Complexity: 1 Mod          Brendalyn Vallely B. Migdalia Dk PT, DPT Acute Rehabilitation Services Pager [phone] Office [phone]   [place] 02/03/2019, 3:24 PM

## 2019-02-03 NOTE — Telephone Encounter (Signed)
LVM for [place] with Verbal orders.

## 2019-02-04 ENCOUNTER — Encounter: Payer: Self-pay | Admitting: Family Medicine

## 2019-02-04 ENCOUNTER — Ambulatory Visit: Payer: Medicare Other | Admitting: Family Medicine

## 2019-02-04 ENCOUNTER — Inpatient Hospital Stay (HOSPITAL_COMMUNITY): Payer: Medicare Other

## 2019-02-04 LAB — BASIC METABOLIC PANEL
Anion gap: 17 — ABNORMAL HIGH (ref 5–15)
BUN: 40 mg/dL — ABNORMAL HIGH (ref 8–23)
CO2: 21 mmol/L — ABNORMAL LOW (ref 22–32)
Calcium: 9 mg/dL (ref 8.9–10.3)
Chloride: 94 mmol/L — ABNORMAL LOW (ref 98–111)
Creatinine, Ser: 2.2 mg/dL — ABNORMAL HIGH (ref 0.44–1.00)
GFR calc Af Amer: 24 mL/min — ABNORMAL LOW (ref >60)
GFR calc non Af Amer: 21 mL/min — ABNORMAL LOW (ref >60)
Glucose, Bld: 90 mg/dL (ref 70–99)
Potassium: 4.6 mmol/L (ref 3.5–5.1)
Sodium: 132 mmol/L — ABNORMAL LOW (ref 135–145)

## 2019-02-04 MED ORDER — FUROSEMIDE 10 MG/ML IJ SOLN
80.0000 mg | Freq: Two times a day (BID) | INTRAMUSCULAR | Status: DC
  Administered 2019-02-04 – 2019-02-05 (×3): 80 mg via INTRAVENOUS
  Filled 2019-02-04 (×3): qty 8

## 2019-02-04 MED ORDER — FUROSEMIDE 10 MG/ML IJ SOLN
40.0000 mg | Freq: Once | INTRAMUSCULAR | Status: AC
  Administered 2019-02-04: 11:00:00 40 mg via INTRAVENOUS
  Filled 2019-02-04: qty 4

## 2019-02-04 NOTE — TOC Initial Note (Signed)
Transition of Care [facility]) - Initial/Assessment Note    Patient Details  Name: Teresa [name] MRN: [id number] Date of Birth: 21-Mar-1941  Transition of [facility]) CM/SW Contact:    Eileen Stanford, LCSW Phone Number: 02/04/2019, 10:54 AM  Clinical Narrative:   CSW spoke with pt's son/cargiver. [name] son states pt's confusion started last weekend-- pt became confused in pt's son's house on Sunday and pt's son brought her in on Wednesday. [name] son states she was living with him, but that he does not have the skills/knowledge to be caring for his mother. [name] son states he wishes she could stay with him but he doesn't feel that its safe for him to care for her. [name] son states he would like for her to go [facility]) in [place] as she has been there before and he is hopeful it will be a familiar space. CSW to reach out to SNF to determine if they can take her.                 Expected Discharge Plan: Skilled Nursing Facility Barriers to Discharge: Continued Medical Work up   Patient Goals and CMS Choice Patient states their goals for this hospitalization and ongoing recovery are:: to get better CMS Medicare.gov Compare Post Acute Care list provided to:: Patient Represenative (must comment) Choice offered to / list presented to : Adult Children  Expected Discharge Plan and Services Expected Discharge Plan: [place] In-house Referral: Clinical Social Work Discharge Planning Services: NA Post Acute Care Choice: [place] Living arrangements for the past 2 months: [place]                                      Prior Living Arrangements/Services Living arrangements for the past 2 months: Single Family Home Lives with:: Adult Children Patient language and need for interpreter reviewed:: Yes Do you feel safe going back to the place where you live?: Yes      Need for Family Participation in Patient Care: Yes (Comment) Care giver support system  in place?: Yes (comment)   Criminal Activity/Legal Involvement Pertinent to Current Situation/Hospitalization: No - Comment as needed  Activities of Daily Living Home Assistive Devices/Equipment: Walker (specify type), Grab bars in shower ADL Screening (condition at time of admission) Patient's cognitive ability adequate to safely complete daily activities?: No Is the patient deaf or have difficulty hearing?: Yes Does the patient have difficulty seeing, even when wearing glasses/contacts?: No Does the patient have difficulty concentrating, remembering, or making decisions?: No Patient able to express need for assistance with ADLs?: Yes Does the patient have difficulty dressing or bathing?: Yes Independently performs ADLs?: Yes (appropriate for developmental age) Communication: Independent Dressing (OT): Needs assistance Is this a change from baseline?: Pre-admission baseline Grooming: Needs assistance Is this a change from baseline?: Pre-admission baseline Feeding: Needs assistance Is this a change from baseline?: Pre-admission baseline Bathing: Needs assistance Is this a change from baseline?: Pre-admission baseline Toileting: Needs assistance Is this a change from baseline?: Pre-admission baseline In/Out Bed: Needs assistance Is this a change from baseline?: Pre-admission baseline Walks in Home: Needs assistance Is this a change from baseline?: Pre-admission baseline Does the patient have difficulty walking or climbing stairs?: Yes Weakness of Legs: Both Weakness of Arms/Hands: None  Permission Sought/Granted Permission sought to share information with : Family Supports    Share Information with NAME: [name]  Permission granted to share info w AGENCY: [facility] in [place] granted to share info w Relationship: Son     Emotional Assessment Appearance:: Appears stated age Attitude/Demeanor/Rapport: Unable to Assess Affect (typically observed): Unable to  Assess Orientation: : Fluctuating Orientation (Suspected and/or reported Sundowners)(Sundowners, recent confusion) Alcohol / Substance Use: Not Applicable Psych Involvement: No (comment)  Admission diagnosis:  Confusion [R41.0] Acute on chronic congestive heart failure, unspecified heart failure type [facility]) [I50.9] Patient Active Problem List   Diagnosis Date Noted  . Acute on chronic systolic (congestive) heart failure ([place]) 02/02/2019  . Dementia without behavioral disturbance ([place]) 02/02/2019  . Severe muscle deconditioning 02/02/2019  . Palliative care patient 01/11/2019  . Physical deconditioning 01/11/2019  . Major depressive disorder 01/11/2019  . Adjustment disorder with mixed anxiety and depressed mood 01/08/2019  . Goals of care, counseling/discussion   . Sepsis ([place]) 12/28/2018  . Abnormal transaminases 10/15/2018  . Thrombocytopenia ([place]) 10/15/2018  . AKI (acute kidney injury) ([place])   . Hypomagnesemia   . Pressure injury of skin 08/12/2018  . Acute kidney injury superimposed on CKD ([place]) 08/10/2018  . Hypotension due to hypovolemia 08/10/2018  . Hypokalemia 08/10/2018  . Hyponatremia 08/10/2018  . Metabolic acidosis, increased anion gap 08/10/2018  . Hypocalcemia 08/10/2018  . Elevated troponin 08/10/2018  . Prolonged QT interval 08/10/2018  . Atrial fibrillation with RVR ([place]) 08/10/2018  . Pancreatic pseudocyst 11/09/2017  . Polymyalgia rheumatica ([place]) 10/12/2016  . Anxiety and depression 10/12/2016  . Bilateral hip pain 06/24/2016  . Chronic pain of both shoulders 06/24/2016  . Psychophysiological insomnia 05/12/2016  . Spinal stenosis   . History of glaucoma   . History of small bowel obstruction   . Peptic ulcer disease   . Chronic systolic CHF (congestive heart failure), NYHA class 2 ([place])   . Moderate mitral regurgitation   . Moderate tricuspid regurgitation   . H/O hiatal hernia   . Urge urinary incontinence   . Left ovarian cyst   . Diabetes  mellitus type 2, diet-controlled ([place])   . Shortness of breath   . Wears dentures   . Hypothyroidism   . Generalized anxiety disorder 09/15/2013  . Hernia, incisional 05/10/2012  . Abdominal wall hernia 02/10/2012  . Dyslipidemia 05/01/2011  . GERD (gastroesophageal reflux disease) 05/01/2011  . Osteoarthritis 05/01/2011  . Chronic renal insufficiency 05/01/2011  . HLD (hyperlipidemia) 05/01/2011  . SBO (small bowel obstruction), s/p expl lap 04/24/2011  . Nonischemic cardiomyopathy ([place])   . Arthritis of ankle or foot, degenerative 07/12/2010  . Arthritis of hand, degenerative 07/12/2010  . Malaise and fatigue 07/12/2010  . Essential (primary) hypertension 07/12/2010  . Glaucoma 07/12/2010   PCP:  [name], MD Pharmacy:   [facility], [place] [address] Phone: [phone] Fax: [phone]     Social Determinants of Health (SDOH) Interventions    Readmission Risk Interventions Readmission Risk Prevention Plan 10/18/2018 10/15/2018  Transportation Screening - Complete  HRI or [place] - Complete  Social Work Consult for [place] Planning/Counseling Complete -  Palliative Care Screening - Not Applicable  Medication Review Press photographer) - Complete  Some recent data might be hidden

## 2019-02-04 NOTE — Progress Notes (Addendum)
PROGRESS NOTE    Teresa [name]  DJT:[id number] DOB: [birth year] DOA: 02/02/2019 PCP: [name], MD  Brief Narrative: 78 year old chronically ill female with chronic systolic CHF, EF of 30%, severe MR, moderate TR, type 2 diabetes mellitus, stage III chronic chronic kidney disease, paroxysmal atrial fibrillation on Eliquis, CAD, hypothyroidism, multiple frequent hospitalizations with CHF was recently hospitalized and discharged few weeks ago with CHF exacerbation complicated by acute kidney injury. -Went to rehab and then went home 5 days ago, subsequently continued to decline with worsening dyspnea, hypoxemia, physical debility and decline, family was unable to provide the level of care that was needed for her at home and brought her to the emergency room where she was found to have acute on chronic systolic CHF again  Assessment & Plan:   Acute on chronic systolic CHF exacerbation -Frequent recurrent hospitalizations for same, EF is 20% with severe MR and moderate TR, -Has been seen by palliative care multiple times, palliative focused care recommended by cardiology during recent hospitalization as well -Remains volume overloaded, continue IV Lasix will increase dose to 80 mg every 12  -Continue Toprol  -Recent echo in July with EF of 20 to 25%, severe MR -Called and discussed with son, continue supportive care, conservative management only at this point with goal to transition to SNF with palliative care follow-up when medically optimized  -PT OT, social work consult  Atrial Fibrillation:   -Was on Cardizem drip on admission, this was discontinued after her blood pressure dropped subsequently -Continue Toprol, heart rate better but still suboptimal, will defer increasing Toprol in the setting of acute heart failure, continue Eliquis  AKI on stage 3 CKD -Baseline creatinine around 1.8-2 range,  -Likely cardiorenal, now up to 2.2 -Remains volume overloaded with pulmonary vascular  congestion and 2+ edema, increase Lasix today, monitor urine output -B met daily  HTN -Continue Toprol XL  DM -Diet controlled -Monitor CBGs, defer insulin for now unless it spikes  Anxiety/depression/dementia -Patient suffered loss of her daughter in July and has been declining since then -She has underlying depression/anxiety and takes Buspar,, Klonopin -Based on prior hospitalization and son's report, strongly suspect underlying dementia that has been unmasked by recent life stressors and medical problems -She is pleasant, oriented to self and place at this time, but apparently experienced significant sundowning while in SNF and home  Hypothyroidism -Continue Synthroid at current dose  Deconditioning -Patient went to rehab following last hospitalization -She lived alone until march -She is now too difficult to care for in her son's home and he reports that she will need placement -SW consulted, PT/OT consulted   DVT prophylaxis: Apixaban  Code Status:  DNR - confirmed with son Family Communication:  No family at bedside, called and updated son 8/20 Disposition Plan:   Likely SNF for rehab with palliative care follow-up   Consultants:      Procedures:   Antimicrobials:    Subjective: -Feels weak, wants to get back in bed -Continues to have shortness of breath and swelling  Objective: Vitals:   02/04/19 0038 02/04/19 0500 02/04/19 1011 02/04/19 1147  BP:  (!) 124/91 (!) 113/92   Pulse: (!) 114 (!) 112 (!) 119 (!) 116  Resp: 20 18 (!) 26 19  Temp:  97.6 F (36.4 C)    TempSrc:  Axillary    SpO2: 100% 99%  100%  Weight:  92.3 kg    Height:        Intake/Output Summary (Last 24 hours) at  02/04/2019 1513 Last data filed at 02/04/2019 1129 Gross per 24 hour  Intake 369 ml  Output 1000 ml  Net -631 ml   Filed Weights   02/02/19 0924 02/03/19 0643 02/04/19 0500  Weight: 93 kg 93 kg 92.3 kg    Examination:  Gen: Really frail female, awake,  alert oriented to self and partly to place HEENT: PERRLA, Neck supple, positive JVD Lungs: Bibasilar crackles CVS: RRR,No Gallops,Rubs or new Murmurs Abd: soft, Non tender, non distended, BS present Extremities: 2+ edema Skin: no new rashes Psychiatry: Poor insight and judgment     Data Reviewed:   CBC: Recent Labs  Lab 02/02/19 0655 02/03/19 0040  WBC 8.0 8.7  NEUTROABS 5.3 5.4  HGB 12.4 11.4*  HCT 40.8 36.3  MCV 99.5 96.8  PLT 189 637   Basic Metabolic Panel: Recent Labs  Lab 02/02/19 0655 02/03/19 0040 02/04/19 0333  NA 138 135 132*  K 4.5 5.4* 4.6  CL 98 97* 94*  CO2 23 20* 21*  GLUCOSE 129* 83 90  BUN 29* 32* 40*  CREATININE 1.78* 2.11* 2.20*  CALCIUM 9.2 9.0 9.0   GFR: Estimated Creatinine Clearance: 22.8 mL/min (A) (by C-G formula based on SCr of 2.2 mg/dL (H)). Liver Function Tests: Recent Labs  Lab 02/02/19 0655  AST 29  ALT 28  ALKPHOS 189*  BILITOT 1.9*  PROT 6.3*  ALBUMIN 3.3*   No results for input(s): LIPASE, AMYLASE in the last 168 hours. Recent Labs  Lab 02/02/19 0741  AMMONIA 25   Coagulation Profile: Recent Labs  Lab 02/02/19 0655  INR 2.2*   Cardiac Enzymes: No results for input(s): CKTOTAL, CKMB, CKMBINDEX, TROPONINI in the last 168 hours. BNP (last 3 results) No results for input(s): PROBNP in the last 8760 hours. HbA1C: No results for input(s): HGBA1C in the last 72 hours. CBG: No results for input(s): GLUCAP in the last 168 hours. Lipid Profile: No results for input(s): CHOL, HDL, LDLCALC, TRIG, CHOLHDL, LDLDIRECT in the last 72 hours. Thyroid Function Tests: No results for input(s): TSH, T4TOTAL, FREET4, T3FREE, THYROIDAB in the last 72 hours. Anemia Panel: No results for input(s): VITAMINB12, FOLATE, FERRITIN, TIBC, IRON, RETICCTPCT in the last 72 hours. Urine analysis:    Component Value Date/Time   COLORURINE YELLOW 02/02/2019 0955   APPEARANCEUR CLEAR 02/02/2019 0955   APPEARANCEUR Clear 12/21/2017 1824    LABSPEC 1.010 02/02/2019 0955   PHURINE 6.0 02/02/2019 0955   GLUCOSEU NEGATIVE 02/02/2019 0955   HGBUR NEGATIVE 02/02/2019 0955   BILIRUBINUR NEGATIVE 02/02/2019 0955   BILIRUBINUR Negative 12/21/2017 1824   KETONESUR NEGATIVE 02/02/2019 0955   PROTEINUR NEGATIVE 02/02/2019 0955   UROBILINOGEN 0.2 12/23/2016 1317   UROBILINOGEN 0.2 05/27/2011 1835   NITRITE NEGATIVE 02/02/2019 0955   LEUKOCYTESUR NEGATIVE 02/02/2019 0955   Sepsis Labs: '@LABRCNTIP' (procalcitonin:4,lacticidven:4)  ) Recent Results (from the past 240 hour(s))  SARS Coronavirus 2 [facility] order, Performed in [facility] hospital lab) Nasopharyngeal Nasopharyngeal Swab     Status: None   Collection Time: 02/02/19 11:20 AM   Specimen: Nasopharyngeal Swab  Result Value Ref Range Status   SARS Coronavirus 2 NEGATIVE NEGATIVE Final    Comment: (NOTE) If result is NEGATIVE SARS-CoV-2 target nucleic acids are NOT DETECTED. The SARS-CoV-2 RNA is generally detectable in upper and lower  respiratory specimens during the acute phase of infection. The lowest  concentration of SARS-CoV-2 viral copies this assay can detect is 250  copies / mL. A negative result does not preclude SARS-CoV-2 infection  and should not be used as the sole basis for treatment or other  patient management decisions.  A negative result may occur with  improper specimen collection / handling, submission of specimen other  than nasopharyngeal swab, presence of viral mutation(s) within the  areas targeted by this assay, and inadequate number of viral copies  (<250 copies / mL). A negative result must be combined with clinical  observations, patient history, and epidemiological information. If result is POSITIVE SARS-CoV-2 target nucleic acids are DETECTED. The SARS-CoV-2 RNA is generally detectable in upper and lower  respiratory specimens dur ing the acute phase of infection.  Positive  results are indicative of active infection with SARS-CoV-2.   Clinical  correlation with patient history and other diagnostic information is  necessary to determine patient infection status.  Positive results do  not rule out bacterial infection or co-infection with other viruses. If result is PRESUMPTIVE POSTIVE SARS-CoV-2 nucleic acids MAY BE PRESENT.   A presumptive positive result was obtained on the submitted specimen  and confirmed on repeat testing.  While 2019 novel coronavirus  (SARS-CoV-2) nucleic acids may be present in the submitted sample  additional confirmatory testing may be necessary for epidemiological  and / or clinical management purposes  to differentiate between  SARS-CoV-2 and other Sarbecovirus currently known to infect humans.  If clinically indicated additional testing with an alternate test  methodology [phone]) is advised. The SARS-CoV-2 RNA is generally  detectable in upper and lower respiratory sp ecimens during the acute  phase of infection. The expected result is Negative. Fact Sheet for Patients:  StrictlyIdeas.no Fact Sheet for Healthcare Providers: BankingDealers.co.za This test is not yet approved or cleared by the [place] FDA and has been authorized for detection and/or diagnosis of SARS-CoV-2 by FDA under an Emergency Use Authorization (EUA).  This EUA will remain in effect (meaning this test can be used) for the duration of the COVID-19 declaration under Section 564(b)(1) of the Act, 21 U.S.C. section 360bbb-3(b)(1), unless the authorization is terminated or revoked sooner. Performed at [facility], [place] [address], [place], [place] [address]          Radiology Studies: Dg Chest [place] 1 View  Result Date: 02/04/2019 CLINICAL DATA:  78 year old female with a history of shortness of breath EXAM: PORTABLE CHEST 1 VIEW COMPARISON:  02/02/2019 FINDINGS: Cardiomediastinal silhouette unchanged with cardiomegaly. No pneumothorax. Interlobular septal  thickening. Opacity at the left lung base obscures the left hemidiaphragm with blunting of left costophrenic angle. This is unchanged from the comparison. No displaced fracture. IMPRESSION: Similar appearance of the chest x-ray with evidence of mild pulmonary edema and left basilar opacity potentially a combination of pleural fluid, atelectasis/consolidation. Cardiomegaly. Electronically Signed   By: Corrie Mckusick D.O.   On: 02/04/2019 08:25        Scheduled Meds: . apixaban  5 mg Oral BID  . busPIRone  7.5 mg Oral Daily  . furosemide  80 mg Intravenous BID  . levothyroxine  100 mcg Oral QAC breakfast  . magnesium oxide  200 mg Oral BID  . metoprolol succinate  50 mg Oral Daily  . nystatin   Topical BID  . sodium chloride flush  3 mL Intravenous Q12H   Continuous Infusions: . sodium chloride       LOS: 2 days    Time spent: 84mn    PDomenic Polite MD [facility] Page via [email or web address], password [email or web address] After 7PM please contact night-coverage  02/04/2019, 3:13 PM

## 2019-02-04 NOTE — NC FL2 (Signed)
[place] LEVEL OF CARE SCREENING TOOL     IDENTIFICATION  Patient Name: Teresa [name] Birthdate: 1941/05/10 Sex: female Admission Date (Current Location): 02/02/2019  [facility] and [place] Number:  Herbalist and Address:  [facility], [place] [address] [place], [place] [address]      Provider Number: [id number]  Attending Physician Name and Address:  Domenic Polite, MD  Relative Name and Phone Number:       Current Level of Care: Hospital Recommended Level of Care: [place] Prior Approval Number:    Date Approved/Denied:   PASRR Number: [id number] F experation date: 03/14/2019  Discharge Plan: SNF    Current Diagnoses: Patient Active Problem List   Diagnosis Date Noted  . Acute on chronic systolic (congestive) heart failure ([place]) 02/02/2019  . Dementia without behavioral disturbance ([place]) 02/02/2019  . Severe muscle deconditioning 02/02/2019  . Palliative care patient 01/11/2019  . Physical deconditioning 01/11/2019  . Major depressive disorder 01/11/2019  . Adjustment disorder with mixed anxiety and depressed mood 01/08/2019  . Goals of care, counseling/discussion   . Sepsis ([place]) 12/28/2018  . Abnormal transaminases 10/15/2018  . Thrombocytopenia ([place]) 10/15/2018  . AKI (acute kidney injury) ([place])   . Hypomagnesemia   . Pressure injury of skin 08/12/2018  . Acute kidney injury superimposed on CKD ([place]) 08/10/2018  . Hypotension due to hypovolemia 08/10/2018  . Hypokalemia 08/10/2018  . Hyponatremia 08/10/2018  . Metabolic acidosis, increased anion gap 08/10/2018  . Hypocalcemia 08/10/2018  . Elevated troponin 08/10/2018  . Prolonged QT interval 08/10/2018  . Atrial fibrillation with RVR ([place]) 08/10/2018  . Pancreatic pseudocyst 11/09/2017  . Polymyalgia rheumatica ([place]) 10/12/2016  . Anxiety and depression 10/12/2016  . Bilateral hip pain 06/24/2016  . Chronic pain of both shoulders 06/24/2016   . Psychophysiological insomnia 05/12/2016  . Spinal stenosis   . History of glaucoma   . History of small bowel obstruction   . Peptic ulcer disease   . Chronic systolic CHF (congestive heart failure), NYHA class 2 ([place])   . Moderate mitral regurgitation   . Moderate tricuspid regurgitation   . H/O hiatal hernia   . Urge urinary incontinence   . Left ovarian cyst   . Diabetes mellitus type 2, diet-controlled ([place])   . Shortness of breath   . Wears dentures   . Hypothyroidism   . Generalized anxiety disorder 09/15/2013  . Hernia, incisional 05/10/2012  . Abdominal wall hernia 02/10/2012  . Dyslipidemia 05/01/2011  . GERD (gastroesophageal reflux disease) 05/01/2011  . Osteoarthritis 05/01/2011  . Chronic renal insufficiency 05/01/2011  . HLD (hyperlipidemia) 05/01/2011  . SBO (small bowel obstruction), s/p expl lap 04/24/2011  . Nonischemic cardiomyopathy ([place])   . Arthritis of ankle or foot, degenerative 07/12/2010  . Arthritis of hand, degenerative 07/12/2010  . Malaise and fatigue 07/12/2010  . Essential (primary) hypertension 07/12/2010  . Glaucoma 07/12/2010    Orientation RESPIRATION BLADDER Height & Weight     Self, Time, Situation, Place  Normal Incontinent Weight: 203 lb 8 oz (92.3 kg) Height:  5\' 3"  (160 cm)  BEHAVIORAL SYMPTOMS/MOOD NEUROLOGICAL BOWEL NUTRITION STATUS      Continent Diet(heart healthy, thin liquids)  AMBULATORY STATUS COMMUNICATION OF NEEDS Skin   Limited Assist Verbally Normal                       Personal Care Assistance Level of Assistance  Bathing, Feeding, Dressing Bathing Assistance: Limited assistance  Feeding assistance: Independent Dressing Assistance: Limited assistance     Functional Limitations Info  Sight, Hearing, Speech Sight Info: Adequate Hearing Info: Adequate Speech Info: Adequate    SPECIAL CARE FACTORS FREQUENCY  PT (By licensed PT), OT (By licensed OT)     PT Frequency: 5x OT Frequency: 5x             Contractures Contractures Info: Not present    Additional Factors Info  Code Status, Allergies Code Status Info: DNR Allergies Info: Ambien (Zolpidem Tartrate), Aspirin, Nsaids, Tolmetin Psychotropic Info: busPIRone (BUSPAR) tablet 7.5 mg, clonazepam (KLONOPIN) disintegrating tablet 0.25 mg         Current Medications (02/04/2019):  This is the current hospital active medication list Current Facility-Administered Medications  Medication Dose Route Frequency Provider Last Rate Last Dose  . 0.9 %  sodium chloride infusion  250 mL Intravenous PRN Karmen Bongo, MD      . acetaminophen (TYLENOL) tablet 650 mg  650 mg Oral Q4H PRN Karmen Bongo, MD      . apixaban Arne Cleveland) tablet 5 mg  5 mg Oral BID Karmen Bongo, MD   5 mg at 02/04/19 1011  . busPIRone (BUSPAR) tablet 7.5 mg  7.5 mg Oral Daily Karmen Bongo, MD   7.5 mg at 02/04/19 1007  . clonazepam (KLONOPIN) disintegrating tablet 0.25 mg  0.25 mg Oral BID PRN Karmen Bongo, MD   0.25 mg at 02/02/19 1756  . furosemide (LASIX) injection 80 mg  80 mg Intravenous BID Domenic Polite, MD      . HYDROcodone-acetaminophen (NORCO/VICODIN) 5-325 MG per tablet 1 tablet  1 tablet Oral Q6H PRN Karmen Bongo, MD   1 tablet at 02/02/19 1606  . levothyroxine (SYNTHROID) tablet 100 mcg  100 mcg Oral QAC breakfast Karmen Bongo, MD   100 mcg at 02/04/19 [id number]  . magnesium oxide (MAG-OX) tablet 200 mg  200 mg Oral BID Karmen Bongo, MD   200 mg at 02/04/19 1011  . Melatonin TABS 3 mg  3 mg Oral QHS PRN Karmen Bongo, MD   3 mg at 02/03/19 2240  . metoprolol succinate (TOPROL-XL) 24 hr tablet 50 mg  50 mg Oral Daily Karmen Bongo, MD   50 mg at 02/04/19 1011  . nystatin (MYCOSTATIN/NYSTOP) topical powder   Topical BID Karmen Bongo, MD      . polyethylene glycol (MIRALAX / GLYCOLAX) packet 17 g  17 g Oral Daily PRN Karmen Bongo, MD      . sodium chloride flush (NS) 0.9 % injection 3 mL  3 mL Intravenous Q12H Karmen Bongo, MD    3 mL at 02/04/19 1129  . sodium chloride flush (NS) 0.9 % injection 3 mL  3 mL Intravenous PRN Karmen Bongo, MD         Discharge Medications: Please see discharge summary for a list of discharge medications.  Relevant Imaging Results:  Relevant Lab Results:   Additional Information [id number]  Gerrianne Scale Anjenette Gerbino, LCSW

## 2019-02-04 NOTE — Care Management Important Message (Signed)
Important Message  Patient Details  Name: Teresa [name] MRN: [id number] Date of Birth: [birth year]   Medicare Important Message Given:  Yes     [name] 02/04/2019, 10:43 AM

## 2019-02-05 LAB — BASIC METABOLIC PANEL
Anion gap: 14 (ref 5–15)
BUN: 36 mg/dL — ABNORMAL HIGH (ref 8–23)
CO2: 25 mmol/L (ref 22–32)
Calcium: 8.6 mg/dL — ABNORMAL LOW (ref 8.9–10.3)
Chloride: 95 mmol/L — ABNORMAL LOW (ref 98–111)
Creatinine, Ser: 1.9 mg/dL — ABNORMAL HIGH (ref 0.44–1.00)
GFR calc Af Amer: 29 mL/min — ABNORMAL LOW (ref >60)
GFR calc non Af Amer: 25 mL/min — ABNORMAL LOW (ref >60)
Glucose, Bld: 119 mg/dL — ABNORMAL HIGH (ref 70–99)
Potassium: 3.5 mmol/L (ref 3.5–5.1)
Sodium: 134 mmol/L — ABNORMAL LOW (ref 135–145)

## 2019-02-05 LAB — CBC
HCT: 35.8 % — ABNORMAL LOW (ref 36.0–46.0)
Hemoglobin: 11.5 g/dL — ABNORMAL LOW (ref 12.0–15.0)
MCH: 30.5 pg (ref 26.0–34.0)
MCHC: 32.1 g/dL (ref 30.0–36.0)
MCV: 95 fL (ref 80.0–100.0)
Platelets: 184 10*3/uL (ref 150–400)
RBC: 3.77 MIL/uL — ABNORMAL LOW (ref 3.87–5.11)
RDW: 17.9 % — ABNORMAL HIGH (ref 11.5–15.5)
WBC: 8.1 10*3/uL (ref 4.0–10.5)
nRBC: 0 % (ref 0.0–0.2)

## 2019-02-05 MED ORDER — POTASSIUM CHLORIDE CRYS ER 20 MEQ PO TBCR
40.0000 meq | EXTENDED_RELEASE_TABLET | Freq: Two times a day (BID) | ORAL | Status: DC
  Administered 2019-02-05 – 2019-02-06 (×3): 40 meq via ORAL
  Filled 2019-02-05 (×2): qty 2

## 2019-02-05 NOTE — Progress Notes (Signed)
PROGRESS NOTE    Teresa [name]  [id number] DOB: [birth year] DOA: 02/02/2019 PCP: [name], MD  Brief Narrative: 78 year old chronically ill female with chronic systolic CHF, EF of 123456, severe MR, moderate TR, type 2 diabetes mellitus, stage III chronic chronic kidney disease, paroxysmal atrial fibrillation on Eliquis, CAD, hypothyroidism, multiple frequent hospitalizations with CHF was recently hospitalized and discharged few weeks ago with CHF exacerbation complicated by acute kidney injury. -Went to rehab and then went home 5 days ago, subsequently continued to decline with worsening dyspnea, hypoxemia, physical debility and decline, family was unable to provide the level of care that was needed for her at home and brought her to the emergency room where she was found to have acute on chronic systolic CHF again  Assessment & Plan:   Acute on chronic systolic CHF exacerbation -Frequent recurrent hospitalizations for same, EF is 20% with severe MR and moderate TR, -Has been seen by palliative care multiple times, palliative focused care recommended by cardiology during recent hospitalization as well -Volume status improving, continue IV Lasix 80 mg every 12 today -Continue Toprol, is 2.6 L negative, creatinine slightly better as well -Recent echo in July with EF of 20 to 25%, severe MR -Called and discussed with son, continue supportive care, conservative management only at this point with goal to transition to SNF with palliative care follow-up when medically optimized  -PT OT, social work consult for SNF  Atrial Fibrillation:   With RVR -Was on Cardizem drip on admission, this was discontinued after her blood pressure dropped subsequently -Continue Toprol, heart rate better but still suboptimal, will defer increasing Toprol in the setting of acute heart failure, continue Eliquis  AKI on stage 3 CKD -Baseline creatinine around 1.8-2 range,  -Likely cardiorenal,  -Improving with  diuresis, close to baseline, monitor  HTN -Continue Toprol XL  DM -Diet controlled -Monitor CBGs, defer insulin for now unless it spikes  Anxiety/depression/dementia -Patient suffered loss of her daughter in July and has been declining since then -She has underlying depression/anxiety and takes Buspar,, Klonopin -Based on prior hospitalization and son's report, strongly suspect underlying dementia that has been unmasked by recent life stressors and medical problems -She is pleasant, oriented to self and place at this time, but apparently experienced significant sundowning while in SNF and home  Hypothyroidism -Continue Synthroid at current dose  Deconditioning -Patient went to rehab following last hospitalization -She lived alone until march -She is now too difficult to care for in her son's home and he reports that she will need placement -SW consulted, PT/OT consulted   DVT prophylaxis: Apixaban  Code Status:  DNR - confirmed with son Family Communication:  No family at bedside, called and updated son 8/20 Disposition Plan:   Likely SNF for rehab with palliative care follow-up early next week   Consultants:      Procedures:   Antimicrobials:    Subjective: -Breathing better today, -Still short of breath with minimal activity or exertion  Objective: Vitals:   02/04/19 2000 02/04/19 2037 02/05/19 1431 02/05/19 1432  BP:  109/73 (!) 100/58 (!) 100/58  Pulse:  (!) 113 93 93  Resp: 16 (!) 25 (!) 21   Temp:  98.3 F (36.8 C) 98.3 F (36.8 C)   TempSrc:  Oral    SpO2:  97% 96% 96%  Weight:      Height:        Intake/Output Summary (Last 24 hours) at 02/05/2019 1455 Last data filed at 02/05/2019 [phone]  Gross per 24 hour  Intake 540 ml  Output 2100 ml  Net -1560 ml   Filed Weights   02/02/19 0924 02/03/19 0643 02/04/19 0500  Weight: 93 kg 93 kg 92.3 kg    Examination:  Gen: Elderly, frail chronically ill female, awake alert oriented to self and  partly to place HEENT: PERRLA, Neck supple, no JVD Lungs: Improved air movement, bibasilar crackles CVS: S1-S2/irregularly irregular, systolic murmur Abd: soft, Non tender, non distended, BS present Extremities: 2+ edema Skin: no new rashes Psychiatry: Poor insight and judgment     Data Reviewed:   CBC: Recent Labs  Lab 02/02/19 0655 02/03/19 0040 02/05/19 0216  WBC 8.0 8.7 8.1  NEUTROABS 5.3 5.4  --   HGB 12.4 11.4* 11.5*  HCT 40.8 36.3 35.8*  MCV 99.5 96.8 95.0  PLT 189 164 [id number]   Basic Metabolic Panel: Recent Labs  Lab 02/02/19 0655 02/03/19 0040 02/04/19 0333 02/05/19 0216  NA 138 135 132* 134*  K 4.5 5.4* 4.6 3.5  CL 98 97* 94* 95*  CO2 23 20* 21* 25  GLUCOSE 129* 83 90 119*  BUN 29* 32* 40* 36*  CREATININE 1.78* 2.11* 2.20* 1.90*  CALCIUM 9.2 9.0 9.0 8.6*   GFR: Estimated Creatinine Clearance: 26.4 mL/min (A) (by C-G formula based on SCr of 1.9 mg/dL (H)). Liver Function Tests: Recent Labs  Lab 02/02/19 0655  AST 29  ALT 28  ALKPHOS 189*  BILITOT 1.9*  PROT 6.3*  ALBUMIN 3.3*   No results for input(s): LIPASE, AMYLASE in the last 168 hours. Recent Labs  Lab 02/02/19 0741  AMMONIA 25   Coagulation Profile: Recent Labs  Lab 02/02/19 0655  INR 2.2*   Cardiac Enzymes: No results for input(s): CKTOTAL, CKMB, CKMBINDEX, TROPONINI in the last 168 hours. BNP (last 3 results) No results for input(s): PROBNP in the last 8760 hours. HbA1C: No results for input(s): HGBA1C in the last 72 hours. CBG: No results for input(s): GLUCAP in the last 168 hours. Lipid Profile: No results for input(s): CHOL, HDL, LDLCALC, TRIG, CHOLHDL, LDLDIRECT in the last 72 hours. Thyroid Function Tests: No results for input(s): TSH, T4TOTAL, FREET4, T3FREE, THYROIDAB in the last 72 hours. Anemia Panel: No results for input(s): VITAMINB12, FOLATE, FERRITIN, TIBC, IRON, RETICCTPCT in the last 72 hours. Urine analysis:    Component Value Date/Time   COLORURINE  YELLOW 02/02/2019 [place] 02/02/2019 0955   APPEARANCEUR Clear 12/21/2017 1824   LABSPEC 1.010 02/02/2019 0955   PHURINE 6.0 02/02/2019 0955   GLUCOSEU NEGATIVE 02/02/2019 0955   HGBUR NEGATIVE 02/02/2019 0955   BILIRUBINUR NEGATIVE 02/02/2019 0955   BILIRUBINUR Negative 12/21/2017 1824   KETONESUR NEGATIVE 02/02/2019 0955   PROTEINUR NEGATIVE 02/02/2019 0955   UROBILINOGEN 0.2 12/23/2016 1317   UROBILINOGEN 0.2 05/27/2011 1835   NITRITE NEGATIVE 02/02/2019 0955   LEUKOCYTESUR NEGATIVE 02/02/2019 0955   Sepsis Labs: @LABRCNTIP (procalcitonin:4,lacticidven:4)  ) Recent Results (from the past 240 hour(s))  SARS Coronavirus 2 [facility] order, Performed in [facility] hospital lab) Nasopharyngeal Nasopharyngeal Swab     Status: None   Collection Time: 02/02/19 11:20 AM   Specimen: Nasopharyngeal Swab  Result Value Ref Range Status   SARS Coronavirus 2 NEGATIVE NEGATIVE Final    Comment: (NOTE) If result is NEGATIVE SARS-CoV-2 target nucleic acids are NOT DETECTED. The SARS-CoV-2 RNA is generally detectable in upper and lower  respiratory specimens during the acute phase of infection. The lowest  concentration of SARS-CoV-2 viral copies this assay  can detect is 250  copies / mL. A negative result does not preclude SARS-CoV-2 infection  and should not be used as the sole basis for treatment or other  patient management decisions.  A negative result may occur with  improper specimen collection / handling, submission of specimen other  than nasopharyngeal swab, presence of viral mutation(s) within the  areas targeted by this assay, and inadequate number of viral copies  (<250 copies / mL). A negative result must be combined with clinical  observations, patient history, and epidemiological information. If result is POSITIVE SARS-CoV-2 target nucleic acids are DETECTED. The SARS-CoV-2 RNA is generally detectable in upper and lower  respiratory specimens dur ing the  acute phase of infection.  Positive  results are indicative of active infection with SARS-CoV-2.  Clinical  correlation with patient history and other diagnostic information is  necessary to determine patient infection status.  Positive results do  not rule out bacterial infection or co-infection with other viruses. If result is PRESUMPTIVE POSTIVE SARS-CoV-2 nucleic acids MAY BE PRESENT.   A presumptive positive result was obtained on the submitted specimen  and confirmed on repeat testing.  While 2019 novel coronavirus  (SARS-CoV-2) nucleic acids may be present in the submitted sample  additional confirmatory testing may be necessary for epidemiological  and / or clinical management purposes  to differentiate between  SARS-CoV-2 and other Sarbecovirus currently known to infect humans.  If clinically indicated additional testing with an alternate test  methodology [phone]) is advised. The SARS-CoV-2 RNA is generally  detectable in upper and lower respiratory sp ecimens during the acute  phase of infection. The expected result is Negative. Fact Sheet for Patients:  StrictlyIdeas.no Fact Sheet for Healthcare Providers: BankingDealers.co.za This test is not yet approved or cleared by the [place] FDA and has been authorized for detection and/or diagnosis of SARS-CoV-2 by FDA under an Emergency Use Authorization (EUA).  This EUA will remain in effect (meaning this test can be used) for the duration of the COVID-19 declaration under Section 564(b)(1) of the Act, 21 U.S.C. section 360bbb-3(b)(1), unless the authorization is terminated or revoked sooner. Performed at [facility], [place] [address], [place], [place] [address]          Radiology Studies: Dg Chest [place] 1 View  Result Date: 02/04/2019 CLINICAL DATA:  78 year old female with a history of shortness of breath EXAM: PORTABLE CHEST 1 VIEW COMPARISON:  02/02/2019  FINDINGS: Cardiomediastinal silhouette unchanged with cardiomegaly. No pneumothorax. Interlobular septal thickening. Opacity at the left lung base obscures the left hemidiaphragm with blunting of left costophrenic angle. This is unchanged from the comparison. No displaced fracture. IMPRESSION: Similar appearance of the chest x-ray with evidence of mild pulmonary edema and left basilar opacity potentially a combination of pleural fluid, atelectasis/consolidation. Cardiomegaly. Electronically Signed   By: Corrie Mckusick D.O.   On: 02/04/2019 08:25        Scheduled Meds: . apixaban  5 mg Oral BID  . busPIRone  7.5 mg Oral Daily  . furosemide  80 mg Intravenous BID  . levothyroxine  100 mcg Oral QAC breakfast  . magnesium oxide  200 mg Oral BID  . metoprolol succinate  50 mg Oral Daily  . nystatin   Topical BID  . potassium chloride  40 mEq Oral BID  . sodium chloride flush  3 mL Intravenous Q12H   Continuous Infusions: . sodium chloride       LOS: 3 days    Time  spent: 21min    Domenic Polite, MD [facility] Page via [email or web address], password [email or web address] After 7PM please contact night-coverage  02/05/2019, 2:55 PM

## 2019-02-05 NOTE — Progress Notes (Signed)
This note also relates to the following rows which could not be included: Resp - Cannot attach notes to unvalidated device data    02/05/19 1432  Vitals  BP (!) 100/58  MAP (mmHg) 72  BP Location Left Arm  BP Method Automatic  Patient Position (if appropriate) Sitting  Pulse Rate 93  ECG Heart Rate (!) 109  Oxygen Therapy  SpO2 96 %  O2 Device Room Air  MEWS Score  MEWS RR 1  MEWS Pulse 1  MEWS Systolic 1  MEWS LOC 0  MEWS Temp 0  MEWS Score 3  MEWS Score Color Yellow  Provider Notification  Provider Name/Title Domenic Polite, MD  Date Provider Notified 02/05/19  Time Provider Notified [id number]  Notification Type Page  Notification Reason Other (Comment) (policy.  No symptomatic changes)

## 2019-02-06 MED ORDER — DIGOXIN 125 MCG PO TABS
0.1250 mg | ORAL_TABLET | Freq: Every day | ORAL | Status: DC
  Administered 2019-02-07 – 2019-02-13 (×7): 0.125 mg via ORAL
  Filled 2019-02-06 (×7): qty 1

## 2019-02-06 MED ORDER — FUROSEMIDE 10 MG/ML IJ SOLN
40.0000 mg | Freq: Two times a day (BID) | INTRAMUSCULAR | Status: DC
  Administered 2019-02-06: 40 mg via INTRAVENOUS
  Filled 2019-02-06: qty 4

## 2019-02-06 MED ORDER — POTASSIUM CHLORIDE CRYS ER 20 MEQ PO TBCR
40.0000 meq | EXTENDED_RELEASE_TABLET | Freq: Every day | ORAL | Status: DC
  Administered 2019-02-07 – 2019-02-12 (×6): 40 meq via ORAL
  Filled 2019-02-06 (×8): qty 2

## 2019-02-06 MED ORDER — FUROSEMIDE 10 MG/ML IJ SOLN
40.0000 mg | Freq: Two times a day (BID) | INTRAMUSCULAR | Status: DC
  Administered 2019-02-07 – 2019-02-09 (×6): 40 mg via INTRAVENOUS
  Filled 2019-02-06 (×6): qty 4

## 2019-02-06 MED ORDER — DIGOXIN 0.25 MG/ML IJ SOLN
0.1250 mg | Freq: Four times a day (QID) | INTRAMUSCULAR | Status: AC
  Administered 2019-02-06 (×2): 0.125 mg via INTRAVENOUS
  Filled 2019-02-06 (×2): qty 2

## 2019-02-06 NOTE — Progress Notes (Signed)
PROGRESS NOTE    Teresa [name]  [id number] DOB: 03-19-1941 DOA: 02/02/2019 PCP: [name], MD  Brief Narrative: 78 year old chronically ill female with chronic systolic CHF, EF of 123456, severe MR, moderate TR, type 2 diabetes mellitus, stage III chronic chronic kidney disease, paroxysmal atrial fibrillation on Eliquis, CAD, hypothyroidism, multiple frequent hospitalizations with CHF was recently hospitalized and discharged few weeks ago with CHF exacerbation complicated by acute kidney injury. -Went to rehab and then went home 5 days ago, subsequently continued to decline with worsening dyspnea, hypoxemia, physical debility and decline, family was unable to provide the level of care that was needed for her at home and brought her to the emergency room where she was found to have acute on chronic systolic CHF again  Assessment & Plan:   Acute on chronic systolic CHF exacerbation -Recurrent hospitalizations for same, EF is 20% with severe MR and moderate TR, -Has been seen by palliative care multiple times, palliative focused care recommended by cardiology during recent hospitalization as well -Volume status improving, she is 2.8 L negative, continue IV Lasix 40 mg every 12 -Continue Toprol -Recent echo in July with EF of 20 to 25%, severe MR -OVERALL prognosis is poor, Called and discussed with son, continue supportive care, conservative management only at this point with goal to transition to SNF with palliative care follow-up when medically optimized, home with hospice is other option but family doesn't not think they can provide the care she needs at home -PT OT, social work consulted for SNF  Atrial Fibrillation:   With RVR -Was on Cardizem drip on admission, this was discontinued after her blood pressure dropped subsequently -Continue Toprol, rate remains suboptimal, given soft blood pressure I am reluctant to increase Toprol especially in the setting of decompensated heart  failure, add low-dose digoxin  -Continue Eliquis  -Long-term calcium channel blockers is a poor option due to low EF  AKI on stage 3 CKD -Baseline creatinine around 1.8-2 range,  -Likely cardiorenal,  -Improving with diuresis, close to baseline, monitor  HTN -Continue Toprol XL  DM -Diet controlled -Monitor CBGs, defer insulin for now unless it spikes  Anxiety/depression/dementia -Patient suffered loss of her daughter in July and has been declining since then -She has underlying depression/anxiety and takes Buspar,, Klonopin -Based on prior hospitalization and son's report, strongly suspect underlying dementia that has been unmasked by recent life stressors and medical problems -She is pleasant, oriented to self and place at this time, but apparently experienced significant sundowning while in SNF and home  Hypothyroidism -Continue Synthroid at current dose  Deconditioning -Patient went to rehab following last hospitalization -She lived alone until march -She is now too difficult to care for in her son's home and he reports that she will need placement -SW consulted, PT/OT consulted, SNF recommended, patient is reluctant to consider this  DVT prophylaxis: Apixaban  Code Status:  DNR - confirmed with son Family Communication:  No family at bedside, called and updated son 8/21 Disposition Plan:   Likely SNF for rehab with palliative care follow-up early next week   Consultants:      Procedures:   Antimicrobials:    Subjective: -Complains of shortness of breath with minimal exertion -Breathing overall is improving, swelling is improving as well  Objective: Vitals:   02/05/19 2000 02/06/19 0519 02/06/19 1051 02/06/19 1412  BP:  98/77 104/79 (!) 108/58  Pulse:  (!) 102 (!) 115 (!) 110  Resp: (!) 25 20  18   Temp:  97.6 F (36.4 C)  98.3 F (36.8 C)  TempSrc:  Oral    SpO2:  100%  94%  Weight:  89.4 kg    Height:        Intake/Output Summary (Last  24 hours) at 02/06/2019 1457 Last data filed at 02/06/2019 1416 Gross per 24 hour  Intake 790 ml  Output 1700 ml  Net -910 ml   Filed Weights   02/03/19 0643 02/04/19 0500 02/06/19 0519  Weight: 93 kg 92.3 kg 89.4 kg    Examination:  Gen: Obese elderly chronically ill female, sitting up in bed, awake alert oriented to self and place HEENT: Obese, unable to assess JVD Lungs: Bilateral lower lobe crackles CVS: S1-S2/irregularly irregular with systolic murmur Abd: soft, Non tender, non distended, BS present Extremities: 1+ edema Skin: no new rashes Psychiatry: Poor insight and judgment     Data Reviewed:   CBC: Recent Labs  Lab 02/02/19 0655 02/03/19 0040 02/05/19 0216  WBC 8.0 8.7 8.1  NEUTROABS 5.3 5.4  --   HGB 12.4 11.4* 11.5*  HCT 40.8 36.3 35.8*  MCV 99.5 96.8 95.0  PLT 189 164 [id number]   Basic Metabolic Panel: Recent Labs  Lab 02/02/19 0655 02/03/19 0040 02/04/19 0333 02/05/19 0216  NA 138 135 132* 134*  K 4.5 5.4* 4.6 3.5  CL 98 97* 94* 95*  CO2 23 20* 21* 25  GLUCOSE 129* 83 90 119*  BUN 29* 32* 40* 36*  CREATININE 1.78* 2.11* 2.20* 1.90*  CALCIUM 9.2 9.0 9.0 8.6*   GFR: Estimated Creatinine Clearance: 25.9 mL/min (A) (by C-G formula based on SCr of 1.9 mg/dL (H)). Liver Function Tests: Recent Labs  Lab 02/02/19 0655  AST 29  ALT 28  ALKPHOS 189*  BILITOT 1.9*  PROT 6.3*  ALBUMIN 3.3*   No results for input(s): LIPASE, AMYLASE in the last 168 hours. Recent Labs  Lab 02/02/19 0741  AMMONIA 25   Coagulation Profile: Recent Labs  Lab 02/02/19 0655  INR 2.2*   Cardiac Enzymes: No results for input(s): CKTOTAL, CKMB, CKMBINDEX, TROPONINI in the last 168 hours. BNP (last 3 results) No results for input(s): PROBNP in the last 8760 hours. HbA1C: No results for input(s): HGBA1C in the last 72 hours. CBG: No results for input(s): GLUCAP in the last 168 hours. Lipid Profile: No results for input(s): CHOL, HDL, LDLCALC, TRIG, CHOLHDL,  LDLDIRECT in the last 72 hours. Thyroid Function Tests: No results for input(s): TSH, T4TOTAL, FREET4, T3FREE, THYROIDAB in the last 72 hours. Anemia Panel: No results for input(s): VITAMINB12, FOLATE, FERRITIN, TIBC, IRON, RETICCTPCT in the last 72 hours. Urine analysis:    Component Value Date/Time   COLORURINE YELLOW 02/02/2019 [place] 02/02/2019 0955   APPEARANCEUR Clear 12/21/2017 1824   LABSPEC 1.010 02/02/2019 0955   PHURINE 6.0 02/02/2019 0955   GLUCOSEU NEGATIVE 02/02/2019 0955   HGBUR NEGATIVE 02/02/2019 0955   BILIRUBINUR NEGATIVE 02/02/2019 0955   BILIRUBINUR Negative 12/21/2017 1824   KETONESUR NEGATIVE 02/02/2019 0955   PROTEINUR NEGATIVE 02/02/2019 0955   UROBILINOGEN 0.2 12/23/2016 1317   UROBILINOGEN 0.2 05/27/2011 1835   NITRITE NEGATIVE 02/02/2019 0955   LEUKOCYTESUR NEGATIVE 02/02/2019 0955   Sepsis Labs: @LABRCNTIP (procalcitonin:4,lacticidven:4)  ) Recent Results (from the past 240 hour(s))  SARS Coronavirus 2 [facility] order, Performed in [facility] hospital lab) Nasopharyngeal Nasopharyngeal Swab     Status: None   Collection Time: 02/02/19 11:20 AM   Specimen: Nasopharyngeal Swab  Result Value Ref Range Status  SARS Coronavirus 2 NEGATIVE NEGATIVE Final    Comment: (NOTE) If result is NEGATIVE SARS-CoV-2 target nucleic acids are NOT DETECTED. The SARS-CoV-2 RNA is generally detectable in upper and lower  respiratory specimens during the acute phase of infection. The lowest  concentration of SARS-CoV-2 viral copies this assay can detect is 250  copies / mL. A negative result does not preclude SARS-CoV-2 infection  and should not be used as the sole basis for treatment or other  patient management decisions.  A negative result may occur with  improper specimen collection / handling, submission of specimen other  than nasopharyngeal swab, presence of viral mutation(s) within the  areas targeted by this assay, and inadequate number  of viral copies  (<250 copies / mL). A negative result must be combined with clinical  observations, patient history, and epidemiological information. If result is POSITIVE SARS-CoV-2 target nucleic acids are DETECTED. The SARS-CoV-2 RNA is generally detectable in upper and lower  respiratory specimens dur ing the acute phase of infection.  Positive  results are indicative of active infection with SARS-CoV-2.  Clinical  correlation with patient history and other diagnostic information is  necessary to determine patient infection status.  Positive results do  not rule out bacterial infection or co-infection with other viruses. If result is PRESUMPTIVE POSTIVE SARS-CoV-2 nucleic acids MAY BE PRESENT.   A presumptive positive result was obtained on the submitted specimen  and confirmed on repeat testing.  While 2019 novel coronavirus  (SARS-CoV-2) nucleic acids may be present in the submitted sample  additional confirmatory testing may be necessary for epidemiological  and / or clinical management purposes  to differentiate between  SARS-CoV-2 and other Sarbecovirus currently known to infect humans.  If clinically indicated additional testing with an alternate test  methodology [phone]) is advised. The SARS-CoV-2 RNA is generally  detectable in upper and lower respiratory sp ecimens during the acute  phase of infection. The expected result is Negative. Fact Sheet for Patients:  StrictlyIdeas.no Fact Sheet for Healthcare Providers: BankingDealers.co.za This test is not yet approved or cleared by the [place] FDA and has been authorized for detection and/or diagnosis of SARS-CoV-2 by FDA under an Emergency Use Authorization (EUA).  This EUA will remain in effect (meaning this test can be used) for the duration of the COVID-19 declaration under Section 564(b)(1) of the Act, 21 U.S.C. section 360bbb-3(b)(1), unless the authorization is  terminated or revoked sooner. Performed at [facility], [place] [address] [place], [place] [address]          Radiology Studies: No results found.      Scheduled Meds: . apixaban  5 mg Oral BID  . busPIRone  7.5 mg Oral Daily  . digoxin  0.125 mg Intravenous Q6H  . [START ON 02/07/2019] digoxin  0.125 mg Oral Daily  . furosemide  40 mg Intravenous Q12H  . levothyroxine  100 mcg Oral QAC breakfast  . magnesium oxide  200 mg Oral BID  . metoprolol succinate  50 mg Oral Daily  . nystatin   Topical BID  . potassium chloride  40 mEq Oral Daily  . sodium chloride flush  3 mL Intravenous Q12H   Continuous Infusions: . sodium chloride       LOS: 4 days    Time spent: 49min    Domenic Polite, MD [facility] Page via [email or web address], password [email or web address] After 7PM please contact night-coverage  02/06/2019, 2:57 PM

## 2019-02-07 LAB — BASIC METABOLIC PANEL
Anion gap: 14 (ref 5–15)
BUN: 31 mg/dL — ABNORMAL HIGH (ref 8–23)
CO2: 27 mmol/L (ref 22–32)
Calcium: 8.7 mg/dL — ABNORMAL LOW (ref 8.9–10.3)
Chloride: 95 mmol/L — ABNORMAL LOW (ref 98–111)
Creatinine, Ser: 1.47 mg/dL — ABNORMAL HIGH (ref 0.44–1.00)
GFR calc Af Amer: 39 mL/min — ABNORMAL LOW (ref >60)
GFR calc non Af Amer: 34 mL/min — ABNORMAL LOW (ref >60)
Glucose, Bld: 123 mg/dL — ABNORMAL HIGH (ref 70–99)
Potassium: 4.2 mmol/L (ref 3.5–5.1)
Sodium: 136 mmol/L (ref 135–145)

## 2019-02-07 LAB — GLUCOSE, CAPILLARY
Glucose-Capillary: 122 mg/dL — ABNORMAL HIGH (ref 70–99)
Glucose-Capillary: 111 mg/dL — ABNORMAL HIGH (ref 70–99)
Glucose-Capillary: 140 mg/dL — ABNORMAL HIGH (ref 70–99)

## 2019-02-07 NOTE — Care Management Important Message (Signed)
Important Message  Patient Details  Name: Teresa [name] MRN: [id number] Date of Birth: 01-Dec-1940   Medicare Important Message Given:  Yes     [name] 02/07/2019, 1:07 PM

## 2019-02-07 NOTE — Plan of Care (Signed)
  Problem: Pain Managment: Goal: General experience of comfort will improve Outcome: Progressing Note: Pain controlled with po pain meds.   

## 2019-02-07 NOTE — Progress Notes (Signed)
PROGRESS NOTE    DALEAH DIOGUARDI  [id number] DOB: 29-Nov-1940 DOA: 02/02/2019 PCP: [name], MD  Brief Narrative: 78 year old chronically ill female with chronic systolic CHF, EF of 123456, severe MR, moderate TR, type 2 diabetes mellitus, stage III chronic chronic kidney disease, paroxysmal atrial fibrillation on Eliquis, CAD, hypothyroidism, multiple frequent hospitalizations with CHF was recently hospitalized and discharged few weeks ago with CHF exacerbation complicated by acute kidney injury. -Went to rehab and then went home 5 days ago, subsequently continued to decline with worsening dyspnea, hypoxemia, physical debility and decline, family was unable to provide the level of care that was needed for her at home and brought her to the emergency room where she was found to have acute on chronic systolic CHF again -Hospitalization complicated by A. fib RVR  Assessment & Plan:   Acute on chronic systolic CHF exacerbation -Recurrent hospitalizations for same, EF is 20% with severe MR and moderate TR, -Has been seen by palliative care multiple times, palliative focused care recommended by cardiology during recent hospitalization as well -Volume status improving, she is 4 L negative, continue IV Lasix today increased dose to 80 mg every 12  -Continue Toprol  -Recent echo in July with EF of 20% and severe MR  -OVERALL prognosis is poor, Called and discussed with son [name], continueconservative management only at this point with goal to transition to SNF with palliative care follow-up when medically optimized, home with hospice is other option but family doesn't not think they can provide the care she needs at home -PT OT, social work consulted for SNF -discharge planning  Atrial Fibrillation:   With RVR -Was on Cardizem drip on admission, this was discontinued after her blood pressure dropped subsequently -Continue Toprol at current dose -Started on low-dose digoxin yesterday, tolerating  this so far -Heart much improved now -Continue Eliquis, Cardizem would be a poor option for rate control due to depressed EF  AKI on stage 3 CKD -Baseline creatinine around 1.8-2 range,  -Likely cardiorenal,  -Improving with diuresis, creatinine 1.4 today which I suspect is a lab error her baseline is closer to 2  HTN -Continue Toprol XL  DM -Diet controlled -Monitor CBGs, defer insulin for now unless it spikes  Anxiety/depression/dementia -Patient suffered loss of her daughter in July and has been declining since then -She has underlying depression/anxiety and takes Buspar,, Klonopin -Based on prior hospitalization and son's report, strongly suspect underlying dementia that has been unmasked by recent life stressors and medical problems -She is pleasant, oriented to self and place at this time, but apparently experienced significant sundowning while in SNF and home  Hypothyroidism -Continue Synthroid at current dose  Deconditioning -Patient went to rehab following last hospitalization -She lived alone until march -She is now too difficult to care for in her son's home and he reports that she will need placement -SW consulted, PT/OT consulted, SNF recommended, patient remains somewhat hesitant still  DVT prophylaxis: Apixaban  Code Status:  DNR - confirmed with son Family Communication:  No family at bedside, called and discussed with son [name] 8/24 Disposition Plan:   SNF with palliative care follow-up in 1 to 2 days   Consultants:      Procedures:   Antimicrobials:    Subjective: -Still complains of shortness of breath with minimal activity, overall feels like she is getting better and stronger  Objective: Vitals:   02/06/19 2328 02/07/19 0000 02/07/19 0448 02/07/19 0800  BP:   122/76 114/72  Pulse: 88 76  93 68  Resp:    16  Temp:   (!) 97.4 F (36.3 C)   TempSrc:   Oral   SpO2: 99% 100% 100% 99%  Weight:   89 kg   Height:         Intake/Output Summary (Last 24 hours) at 02/07/2019 1436 Last data filed at 02/07/2019 1300 Gross per 24 hour  Intake 444 ml  Output 1650 ml  Net -1206 ml   Filed Weights   02/04/19 0500 02/06/19 0519 02/07/19 0448  Weight: 92.3 kg 89.4 kg 89 kg    Examination:  Gen: Obese elderly, chronically ill female sitting up in bed, awake alert oriented to self and place, hard of hearing HEENT: PERRLA, Neck supple, no JVD Lungs: Bibasilar crackles CVS: S1-S2/irregularly irregular rhythm, systolic murmur noted Abd: soft, Non tender, non distended, BS present Extremities: Trace edema Skin: no new rashes Psychiatry: Poor insight and judgment     Data Reviewed:   CBC: Recent Labs  Lab 02/02/19 0655 02/03/19 0040 02/05/19 0216  WBC 8.0 8.7 8.1  NEUTROABS 5.3 5.4  --   HGB 12.4 11.4* 11.5*  HCT 40.8 36.3 35.8*  MCV 99.5 96.8 95.0  PLT 189 164 [id number]   Basic Metabolic Panel: Recent Labs  Lab 02/02/19 0655 02/03/19 0040 02/04/19 0333 02/05/19 0216 02/07/19 1031  NA 138 135 132* 134* 136  K 4.5 5.4* 4.6 3.5 4.2  CL 98 97* 94* 95* 95*  CO2 23 20* 21* 25 27  GLUCOSE 129* 83 90 119* 123*  BUN 29* 32* 40* 36* 31*  CREATININE 1.78* 2.11* 2.20* 1.90* 1.47*  CALCIUM 9.2 9.0 9.0 8.6* 8.7*   GFR: Estimated Creatinine Clearance: 33.4 mL/min (A) (by C-G formula based on SCr of 1.47 mg/dL (H)). Liver Function Tests: Recent Labs  Lab 02/02/19 0655  AST 29  ALT 28  ALKPHOS 189*  BILITOT 1.9*  PROT 6.3*  ALBUMIN 3.3*   No results for input(s): LIPASE, AMYLASE in the last 168 hours. Recent Labs  Lab 02/02/19 0741  AMMONIA 25   Coagulation Profile: Recent Labs  Lab 02/02/19 0655  INR 2.2*   Cardiac Enzymes: No results for input(s): CKTOTAL, CKMB, CKMBINDEX, TROPONINI in the last 168 hours. BNP (last 3 results) No results for input(s): PROBNP in the last 8760 hours. HbA1C: No results for input(s): HGBA1C in the last 72 hours. CBG: Recent Labs  Lab 02/07/19 0820  02/07/19 1118  GLUCAP 122* 111*   Lipid Profile: No results for input(s): CHOL, HDL, LDLCALC, TRIG, CHOLHDL, LDLDIRECT in the last 72 hours. Thyroid Function Tests: No results for input(s): TSH, T4TOTAL, FREET4, T3FREE, THYROIDAB in the last 72 hours. Anemia Panel: No results for input(s): VITAMINB12, FOLATE, FERRITIN, TIBC, IRON, RETICCTPCT in the last 72 hours. Urine analysis:    Component Value Date/Time   COLORURINE YELLOW 02/02/2019 [place] 02/02/2019 0955   APPEARANCEUR Clear 12/21/2017 1824   LABSPEC 1.010 02/02/2019 0955   PHURINE 6.0 02/02/2019 0955   GLUCOSEU NEGATIVE 02/02/2019 0955   HGBUR NEGATIVE 02/02/2019 0955   BILIRUBINUR NEGATIVE 02/02/2019 0955   BILIRUBINUR Negative 12/21/2017 1824   KETONESUR NEGATIVE 02/02/2019 0955   PROTEINUR NEGATIVE 02/02/2019 0955   UROBILINOGEN 0.2 12/23/2016 1317   UROBILINOGEN 0.2 05/27/2011 1835   NITRITE NEGATIVE 02/02/2019 0955   LEUKOCYTESUR NEGATIVE 02/02/2019 0955   Sepsis Labs: @LABRCNTIP (procalcitonin:4,lacticidven:4)  ) Recent Results (from the past 240 hour(s))  SARS Coronavirus 2 [facility] order, Performed in [facility] hospital lab) Nasopharyngeal Nasopharyngeal  Swab     Status: None   Collection Time: 02/02/19 11:20 AM   Specimen: Nasopharyngeal Swab  Result Value Ref Range Status   SARS Coronavirus 2 NEGATIVE NEGATIVE Final    Comment: (NOTE) If result is NEGATIVE SARS-CoV-2 target nucleic acids are NOT DETECTED. The SARS-CoV-2 RNA is generally detectable in upper and lower  respiratory specimens during the acute phase of infection. The lowest  concentration of SARS-CoV-2 viral copies this assay can detect is 250  copies / mL. A negative result does not preclude SARS-CoV-2 infection  and should not be used as the sole basis for treatment or other  patient management decisions.  A negative result may occur with  improper specimen collection / handling, submission of specimen other  than  nasopharyngeal swab, presence of viral mutation(s) within the  areas targeted by this assay, and inadequate number of viral copies  (<250 copies / mL). A negative result must be combined with clinical  observations, patient history, and epidemiological information. If result is POSITIVE SARS-CoV-2 target nucleic acids are DETECTED. The SARS-CoV-2 RNA is generally detectable in upper and lower  respiratory specimens dur ing the acute phase of infection.  Positive  results are indicative of active infection with SARS-CoV-2.  Clinical  correlation with patient history and other diagnostic information is  necessary to determine patient infection status.  Positive results do  not rule out bacterial infection or co-infection with other viruses. If result is PRESUMPTIVE POSTIVE SARS-CoV-2 nucleic acids MAY BE PRESENT.   A presumptive positive result was obtained on the submitted specimen  and confirmed on repeat testing.  While 2019 novel coronavirus  (SARS-CoV-2) nucleic acids may be present in the submitted sample  additional confirmatory testing may be necessary for epidemiological  and / or clinical management purposes  to differentiate between  SARS-CoV-2 and other Sarbecovirus currently known to infect humans.  If clinically indicated additional testing with an alternate test  methodology [phone]) is advised. The SARS-CoV-2 RNA is generally  detectable in upper and lower respiratory sp ecimens during the acute  phase of infection. The expected result is Negative. Fact Sheet for Patients:  StrictlyIdeas.no Fact Sheet for Healthcare Providers: BankingDealers.co.za This test is not yet approved or cleared by the [place] FDA and has been authorized for detection and/or diagnosis of SARS-CoV-2 by FDA under an Emergency Use Authorization (EUA).  This EUA will remain in effect (meaning this test can be used) for the duration of the  COVID-19 declaration under Section 564(b)(1) of the Act, 21 U.S.C. section 360bbb-3(b)(1), unless the authorization is terminated or revoked sooner. Performed at [facility], [place] [address] [place], [place] [address]          Radiology Studies: No results found.      Scheduled Meds: . apixaban  5 mg Oral BID  . busPIRone  7.5 mg Oral Daily  . digoxin  0.125 mg Oral Daily  . furosemide  40 mg Intravenous Q12H  . levothyroxine  100 mcg Oral QAC breakfast  . magnesium oxide  200 mg Oral BID  . metoprolol succinate  50 mg Oral Daily  . nystatin   Topical BID  . potassium chloride  40 mEq Oral Daily  . sodium chloride flush  3 mL Intravenous Q12H   Continuous Infusions: . sodium chloride       LOS: 5 days    Time spent: 73min    Domenic Polite, MD [facility] Page via [email or web address], password [email or web address] After 7PM  please contact night-coverage  02/07/2019, 2:36 PM

## 2019-02-08 LAB — BASIC METABOLIC PANEL
Anion gap: 9 (ref 5–15)
BUN: 31 mg/dL — ABNORMAL HIGH (ref 8–23)
CO2: 29 mmol/L (ref 22–32)
Calcium: 8.5 mg/dL — ABNORMAL LOW (ref 8.9–10.3)
Chloride: 95 mmol/L — ABNORMAL LOW (ref 98–111)
Creatinine, Ser: 1.32 mg/dL — ABNORMAL HIGH (ref 0.44–1.00)
GFR calc Af Amer: 45 mL/min — ABNORMAL LOW (ref >60)
GFR calc non Af Amer: 39 mL/min — ABNORMAL LOW (ref >60)
Glucose, Bld: 105 mg/dL — ABNORMAL HIGH (ref 70–99)
Potassium: 4.1 mmol/L (ref 3.5–5.1)
Sodium: 133 mmol/L — ABNORMAL LOW (ref 135–145)

## 2019-02-08 LAB — GLUCOSE, CAPILLARY
Glucose-Capillary: 72 mg/dL (ref 70–99)
Glucose-Capillary: 124 mg/dL — ABNORMAL HIGH (ref 70–99)
Glucose-Capillary: 145 mg/dL — ABNORMAL HIGH (ref 70–99)

## 2019-02-08 NOTE — Progress Notes (Signed)
[name] oxygen weaned off from 2L per   since last night, pt able to tolerate well with O2 sats 93-97%.

## 2019-02-08 NOTE — Progress Notes (Signed)
PROGRESS NOTE    Teresa [name]  [id number] DOB: Nov 27, 1940 DOA: 02/02/2019 PCP: [name], MD  Brief Narrative: 78 year old chronically ill female with chronic systolic CHF, EF of 123456, severe MR, moderate TR, type 2 diabetes mellitus, stage III chronic chronic kidney disease, paroxysmal atrial fibrillation on Eliquis, CAD, hypothyroidism, multiple frequent hospitalizations with CHF was recently hospitalized and discharged few weeks ago with CHF exacerbation complicated by acute kidney injury. -Went to rehab and then went home 5 days ago, subsequently continued to decline with worsening dyspnea, hypoxemia, physical debility and decline, family was unable to provide the level of care that was needed for her at home and brought her to the emergency room where she was found to have acute on chronic systolic CHF again -Hospitalization complicated by A. fib RVR  Assessment & Plan:   Acute on chronic systolic CHF exacerbation -Recurrent hospitalizations for same, EF is 20% with severe MR and moderate TR, -Has been seen by palliative care multiple times, palliative focused care recommended by cardiology during recent hospitalization as well -Volume status improving, 6.1 L negative -Continue IV Lasix today, could change to oral diuretics in 1 to 2 days  -Continue Toprol  -Recent echo in July with EF of 20% and severe MR  -OVERALL prognosis is poor, Called and discussed with son [name], continue conservative management only at this point with goal to transition to SNF with palliative care follow-up when medically optimized, home with hospice is other option but family doesn't not think they can provide the care she needs at home -PT OT, social work consulted for SNF -discharge planning  Atrial Fibrillation:   With RVR -Was on Cardizem drip on admission, this was discontinued after her blood pressure dropped subsequently -Continue Toprol at current dose -Started on low-dose digoxin this  admission, tolerating this so far -Heart much improved now -Continue Eliquis, Cardizem would be a poor option for rate control due to depressed EF  AKI on stage 3 CKD -Baseline creatinine around 1.8-2 range,  -Likely cardiorenal,  -Improving with diuresis, monitor  HTN -Continue Toprol XL  DM -Diet controlled -Monitor CBGs, defer insulin for now unless it spikes  Anxiety/depression/dementia -Patient suffered loss of her daughter in July and has been declining since then -She has underlying depression/anxiety and takes Buspar,, Klonopin -Based on prior hospitalization and son's report, strongly suspect underlying dementia that has been unmasked by recent life stressors and medical problems -She is pleasant, oriented to self and place at this time, but apparently experienced significant sundowning while in SNF and home  Hypothyroidism -Continue Synthroid at current dose  Deconditioning -Patient went to rehab following last hospitalization -She lived alone until march -She is now too difficult to care for in her son's home and he reports that she will need placement -SW consulted, PT/OT consulted, SNF recommended, patient remains somewhat hesitant still   DVT prophylaxis: Apixaban  Code Status:  DNR - confirmed with son Family Communication:  No family at bedside, called and discussed with son [name] 8/24 Disposition Plan:   SNF with palliative care follow-up in 1 to 2 days   Consultants:      Procedures:   Antimicrobials:    Subjective: -Breathing better, still undecided about rehab -Mild confusion last night  Objective: Vitals:   02/08/19 0400 02/08/19 0558 02/08/19 0940 02/08/19 1245  BP:   107/69 105/64  Pulse: 80  88 86  Resp: 15  19 20   Temp:  (!) 97.4 F (36.3 C)  (!) 97.5  F (36.4 C)  TempSrc:  Oral  Oral  SpO2: 96%  99% 96%  Weight:  92 kg    Height:        Intake/Output Summary (Last 24 hours) at 02/08/2019 1349 Last data filed at  02/08/2019 1230 Gross per 24 hour  Intake 882 ml  Output 3500 ml  Net -2618 ml   Filed Weights   02/06/19 0519 02/07/19 0448 02/08/19 0558  Weight: 89.4 kg 89 kg 92 kg    Examination:  Gen: Obese elderly chronically ill female, sitting up in bed, alert awake oriented to self and place, hard of hearing HEENT: PERRLA, Neck supple, no JVD Lungs: Bibasilar crackles CVS: S1-S2/irregularly irregular rhythm  abd: soft, Non tender, non distended, BS present Extremities: Trace edema Skin: no new rashes Psychiatry: Poor insight and judgment     Data Reviewed:   CBC: Recent Labs  Lab 02/02/19 0655 02/03/19 0040 02/05/19 0216  WBC 8.0 8.7 8.1  NEUTROABS 5.3 5.4  --   HGB 12.4 11.4* 11.5*  HCT 40.8 36.3 35.8*  MCV 99.5 96.8 95.0  PLT 189 164 Q000111Q   Basic Metabolic Panel: Recent Labs  Lab 02/03/19 0040 02/04/19 0333 02/05/19 0216 02/07/19 1031 02/08/19 0351  NA 135 132* 134* 136 133*  K 5.4* 4.6 3.5 4.2 4.1  CL 97* 94* 95* 95* 95*  CO2 20* 21* 25 27 29   GLUCOSE 83 90 119* 123* 105*  BUN 32* 40* 36* 31* 31*  CREATININE 2.11* 2.20* 1.90* 1.47* 1.32*  CALCIUM 9.0 9.0 8.6* 8.7* 8.5*   GFR: Estimated Creatinine Clearance: 37.8 mL/min (A) (by C-G formula based on SCr of 1.32 mg/dL (H)). Liver Function Tests: Recent Labs  Lab 02/02/19 0655  AST 29  ALT 28  ALKPHOS 189*  BILITOT 1.9*  PROT 6.3*  ALBUMIN 3.3*   No results for input(s): LIPASE, AMYLASE in the last 168 hours. Recent Labs  Lab 02/02/19 0741  AMMONIA 25   Coagulation Profile: Recent Labs  Lab 02/02/19 0655  INR 2.2*   Cardiac Enzymes: No results for input(s): CKTOTAL, CKMB, CKMBINDEX, TROPONINI in the last 168 hours. BNP (last 3 results) No results for input(s): PROBNP in the last 8760 hours. HbA1C: No results for input(s): HGBA1C in the last 72 hours. CBG: Recent Labs  Lab 02/07/19 0820 02/07/19 1118 02/07/19 1611 02/08/19 0759 02/08/19 1118  GLUCAP 122* 111* 140* 72 124*   Lipid  Profile: No results for input(s): CHOL, HDL, LDLCALC, TRIG, CHOLHDL, LDLDIRECT in the last 72 hours. Thyroid Function Tests: No results for input(s): TSH, T4TOTAL, FREET4, T3FREE, THYROIDAB in the last 72 hours. Anemia Panel: No results for input(s): VITAMINB12, FOLATE, FERRITIN, TIBC, IRON, RETICCTPCT in the last 72 hours. Urine analysis:    Component Value Date/Time   COLORURINE YELLOW 02/02/2019 [place] 02/02/2019 0955   APPEARANCEUR Clear 12/21/2017 1824   LABSPEC 1.010 02/02/2019 0955   PHURINE 6.0 02/02/2019 0955   GLUCOSEU NEGATIVE 02/02/2019 0955   HGBUR NEGATIVE 02/02/2019 0955   BILIRUBINUR NEGATIVE 02/02/2019 0955   BILIRUBINUR Negative 12/21/2017 1824   KETONESUR NEGATIVE 02/02/2019 0955   PROTEINUR NEGATIVE 02/02/2019 0955   UROBILINOGEN 0.2 12/23/2016 1317   UROBILINOGEN 0.2 05/27/2011 1835   NITRITE NEGATIVE 02/02/2019 0955   LEUKOCYTESUR NEGATIVE 02/02/2019 0955   Sepsis Labs: @LABRCNTIP (procalcitonin:4,lacticidven:4)  ) Recent Results (from the past 240 hour(s))  SARS Coronavirus 2 [facility] order, Performed in [facility] hospital lab) Nasopharyngeal Nasopharyngeal Swab     Status: None  Collection Time: 02/02/19 11:20 AM   Specimen: Nasopharyngeal Swab  Result Value Ref Range Status   SARS Coronavirus 2 NEGATIVE NEGATIVE Final    Comment: (NOTE) If result is NEGATIVE SARS-CoV-2 target nucleic acids are NOT DETECTED. The SARS-CoV-2 RNA is generally detectable in upper and lower  respiratory specimens during the acute phase of infection. The lowest  concentration of SARS-CoV-2 viral copies this assay can detect is 250  copies / mL. A negative result does not preclude SARS-CoV-2 infection  and should not be used as the sole basis for treatment or other  patient management decisions.  A negative result may occur with  improper specimen collection / handling, submission of specimen other  than nasopharyngeal swab, presence of viral  mutation(s) within the  areas targeted by this assay, and inadequate number of viral copies  (<250 copies / mL). A negative result must be combined with clinical  observations, patient history, and epidemiological information. If result is POSITIVE SARS-CoV-2 target nucleic acids are DETECTED. The SARS-CoV-2 RNA is generally detectable in upper and lower  respiratory specimens dur ing the acute phase of infection.  Positive  results are indicative of active infection with SARS-CoV-2.  Clinical  correlation with patient history and other diagnostic information is  necessary to determine patient infection status.  Positive results do  not rule out bacterial infection or co-infection with other viruses. If result is PRESUMPTIVE POSTIVE SARS-CoV-2 nucleic acids MAY BE PRESENT.   A presumptive positive result was obtained on the submitted specimen  and confirmed on repeat testing.  While 2019 novel coronavirus  (SARS-CoV-2) nucleic acids may be present in the submitted sample  additional confirmatory testing may be necessary for epidemiological  and / or clinical management purposes  to differentiate between  SARS-CoV-2 and other Sarbecovirus currently known to infect humans.  If clinically indicated additional testing with an alternate test  methodology [phone]) is advised. The SARS-CoV-2 RNA is generally  detectable in upper and lower respiratory sp ecimens during the acute  phase of infection. The expected result is Negative. Fact Sheet for Patients:  StrictlyIdeas.no Fact Sheet for Healthcare Providers: BankingDealers.co.za This test is not yet approved or cleared by the [place] FDA and has been authorized for detection and/or diagnosis of SARS-CoV-2 by FDA under an Emergency Use Authorization (EUA).  This EUA will remain in effect (meaning this test can be used) for the duration of the COVID-19 declaration under Section 564(b)(1)  of the Act, 21 U.S.C. section 360bbb-3(b)(1), unless the authorization is terminated or revoked sooner. Performed at [facility], [place] [address] [place], [place] [address]          Radiology Studies: No results found.      Scheduled Meds: . apixaban  5 mg Oral BID  . busPIRone  7.5 mg Oral Daily  . digoxin  0.125 mg Oral Daily  . furosemide  40 mg Intravenous Q12H  . levothyroxine  100 mcg Oral QAC breakfast  . magnesium oxide  200 mg Oral BID  . metoprolol succinate  50 mg Oral Daily  . nystatin   Topical BID  . potassium chloride  40 mEq Oral Daily  . sodium chloride flush  3 mL Intravenous Q12H   Continuous Infusions: . sodium chloride       LOS: 6 days    Time spent: 68min    Domenic Polite, MD [facility] Page via [email or web address], password [email or web address] After 7PM please contact night-coverage  02/08/2019, 1:49 PM

## 2019-02-08 NOTE — Progress Notes (Signed)
Physical Therapy Treatment Patient Details Name: Teresa [name] MRN: [id number] DOB: 08/29/1940 Today's Date: 02/08/2019    History of Present Illness Teresa [name] is a 78 y.o. 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 She was admitted to the [facility] service from 7/14-29 and discharged to SNF; she has since returned home.     PT Comments    Pt had just returned to bed but is willing to get up with therapy. Pt limited in safe mobility by decreased strength and endurance. Pt is modA for bed mobility and minAx2 for power up to [place]. Pt able to static stand in [place] for grooming approximately 2 min and additional 30 sec before sitting in recliner. D/c plans remain appropriate at this time. PT will continue to follow acutely.   Follow Up Recommendations  SNF     Equipment Recommendations  Other (comment)(TBD)       Precautions / Restrictions Precautions Precautions: Fall Restrictions Weight Bearing Restrictions: No    Mobility  Bed Mobility Overal bed mobility: Needs Assistance Bed Mobility: Rolling;Sidelying to Sit Rolling: +2 for physical assistance;Min assist(cues for sequencing and utilizes hand rails) Sidelying to sit: Mod assist;+2 for physical assistance       General bed mobility comments: modA+2  for BLE management, elevate trunk, and scoot hips to EOB  Transfers Overall transfer level: Needs assistance   Transfers: Sit to/from Stand;Stand Pivot Transfers Sit to Stand: +2 physical assistance;Min assist Stand pivot transfers: (stedy)       General transfer comment: pt with decreased activity tolerance requiring minA+2 for sit>stand with stedy      Balance Overall balance assessment: Needs assistance Sitting-balance support: No upper extremity  supported;Feet supported Sitting balance-Leahy Scale: Fair     Standing balance support: Single extremity supported;During functional activity Standing balance-Leahy Scale: Poor Standing balance comment: heavy reliance on BUE support; flexed trunk during functional activity                            Cognition Arousal/Alertness: Awake/alert Behavior During Therapy: WFL for tasks assessed/performed;Flat affect Overall Cognitive Status: No family/caregiver present to determine baseline cognitive functioning                                           General Comments General comments (skin integrity, edema, etc.): VSS, pt on 2L O2 via [place]       Pertinent Vitals/Pain Pain Assessment: No/denies pain Faces Pain Scale: Hurts a little bit Pain Location: generalized Pain Descriptors / Indicators: Sore;Grimacing;Guarding           PT Goals (current goals can now be found in the care plan section) Acute Rehab PT Goals PT Goal Formulation: With patient Time For Goal Achievement: 02/17/19 Potential to Achieve Goals: Fair Progress towards PT goals: Progressing toward goals    Frequency    Min 2X/week      PT Plan Current plan remains appropriate;Frequency needs to be updated    Co-evaluation PT/OT/SLP Co-Evaluation/Treatment: Yes Reason for Co-Treatment: For patient/therapist safety PT goals addressed during session: Mobility/safety with mobility OT goals addressed during session: ADL's and self-care;Strengthening/ROM      AM-PAC PT "6 Clicks" Mobility  Outcome Measure  Help needed turning from your back to your side while in a flat bed without using bedrails?: A Little Help needed moving from lying on your back to sitting on the side of a flat bed without using bedrails?: A Lot Help needed moving to and from a bed to a chair (including a wheelchair)?: A Lot Help needed standing up from a chair using your arms (e.g., wheelchair or bedside  chair)?: A Lot Help needed to walk in hospital room?: Total Help needed climbing 3-5 steps with a railing? : Total 6 Click Score: 11    End of Session Equipment Utilized During Treatment: Gait belt;Oxygen Activity Tolerance: Patient tolerated treatment well Patient left: in chair;with call bell/phone within reach;with chair alarm set Nurse Communication: Mobility status PT Visit Diagnosis: Unsteadiness on feet (R26.81);Other abnormalities of gait and mobility (R26.89);Muscle weakness (generalized) (M62.81);Difficulty in walking, not elsewhere classified (R26.2)     Time: [id number] PT Time Calculation (min) (ACUTE ONLY): 24 min  Charges:  $Therapeutic Activity: 8-22 mins                     Avonna Iribe B. Migdalia Dk PT, DPT Acute Rehabilitation Services Pager [phone] Office [phone]    [place] 02/08/2019, 4:58 PM

## 2019-02-08 NOTE — Progress Notes (Signed)
Occupational Therapy Treatment Patient Details Name: Teresa [name] MRN: [id number] DOB: 10-Feb-1941 Today's Date: 02/08/2019    History of present illness Teresa [name] is a 78 y.o. 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 She was admitted to the [facility] service from 7/14-29 and discharged to SNF; she has since returned home.    OT comments  Pt making progress towards OT goals this session. Pt MOD +2 for bed mobility this session needing cues for sequencing of rolling to use bed rail and assist to elevate trunk and scoot forward. Pt MIN +2 for sit>stand from EOB with stedy. Pt complete standing grooming at sink with supervision. Pt reliant on stedy to maintain upright standing balance. Pt stood with support for ~30 seconds to facilitate increased standing strength as precursor for standing ADL participation. DC plan remains appropriate. Will continue to follow for acute OT needs.   Follow Up Recommendations  SNF    Equipment Recommendations  3 in 1 bedside commode;Tub/shower seat    Recommendations for Other Services      Precautions / Restrictions Precautions Precautions: Fall Restrictions Weight Bearing Restrictions: No       Mobility Bed Mobility Overal bed mobility: Needs Assistance Bed Mobility: Rolling;Sidelying to Sit Rolling: +2 for physical assistance;Min assist(cues for sequencing and utilizes hand rails) Sidelying to sit: Mod assist;+2 for physical assistance       General bed mobility comments: modA+2  for BLE management, elevate trunk, and scoot hips to EOB  Transfers Overall transfer level: Needs assistance   Transfers: Sit to/from Stand;Stand Pivot Transfers Sit to Stand: +2 physical assistance;Min assist Stand pivot transfers: (stedy)        General transfer comment: pt with decreased activity tolerance requiring minA+2 for sit>stand with stedy    Balance Overall balance assessment: Needs assistance Sitting-balance support: No upper extremity supported;Feet supported Sitting balance-Leahy Scale: Fair     Standing balance support: Single extremity supported;During functional activity Standing balance-Leahy Scale: Poor Standing balance comment: heavy reliance on BUE support; flexed trunk during functional activity                           ADL either performed or assessed with clinical judgement   ADL Overall ADL's : Needs assistance/impaired     Grooming: Standing;Supervision/safety;Oral care(standing in stedy, reliant on holding onto stedy with flexed trunk during standing grooming) Grooming Details (indicate cue type and reason): decreased initiation             Lower Body Dressing: Total assistance;Sit to/from stand Lower Body Dressing Details (indicate cue type and reason): pt unable to pull up socks from recliner; pt is HOH and had trouble understanding instructions, unsure if it is cognition issue or decreased sitting balance, pt later states her son does all of that for her Toilet Transfer: Minimal assistance;+2 for physical assistance(stedy) Toilet Transfer Details (indicate cue type and reason): simulated toilet transfer with stedy to recliner,MIN +2 fromEOB         Functional mobility during ADLs: +2 for physical assistance;Rolling walker;Minimal assistance General ADL Comments: decreased activity tolerance and generalized weakness; c/o weakness in arms when standing, decreased initiation to ADL tasks, 2L O2  during ADLs, O2 in 90s, HR 101 with activity     Vision Baseline Vision/History: Wears glasses Wears  Glasses: At all times Patient Visual Report: No change from baseline     Perception     Praxis      Cognition Arousal/Alertness: Awake/alert Behavior During Therapy: WFL for  tasks assessed/performed;Flat affect Overall Cognitive Status: No family/caregiver present to determine baseline cognitive functioning                                          Exercises     Shoulder Instructions       General Comments      Pertinent Vitals/ Pain       Pain Assessment: No/denies pain  Home Living                                          Prior Functioning/Environment              Frequency  Min 2X/week        Progress Toward Goals  OT Goals(current goals can now be found in the care plan section)  Progress towards OT goals: Progressing toward goals  Acute Rehab OT Goals Time For Goal Achievement: 02/17/19 Potential to Achieve Goals: Good  Plan      Co-evaluation    PT/OT/SLP Co-Evaluation/Treatment: Yes Reason for Co-Treatment: To address functional/ADL transfers;Complexity of the patient's impairments (multi-system involvement)   OT goals addressed during session: ADL's and self-care;Strengthening/ROM      AM-PAC OT "6 Clicks" Daily Activity     Outcome Measure   Help from another person eating meals?: A Little Help from another person taking care of personal grooming?: A Little Help from another person toileting, which includes using toliet, bedpan, or urinal?: A Lot Help from another person bathing (including washing, rinsing, drying)?: A Lot Help from another person to put on and taking off regular upper body clothing?: A Little Help from another person to put on and taking off regular lower body clothing?: A Lot 6 Click Score: 15    End of Session Equipment Utilized During Treatment: Gait belt;Oxygen;Other (comment)(stedy)  OT Visit Diagnosis: Unsteadiness on feet (R26.81);Other abnormalities of gait and mobility (R26.89);Muscle weakness (generalized) (M62.81);Feeding difficulties (R63.3);Other symptoms and signs involving cognitive function   Activity Tolerance Patient tolerated treatment  well   Patient Left in chair;with call bell/phone within reach   Nurse Communication Mobility status;Other (comment)(needs new purewic)        Time: [id number] OT Time Calculation (min): 24 min  Charges: OT General Charges $OT Visit: 1 Visit OT Treatments $Self Care/Home Management : 8-22 mins    Ihor Gully, COTA/L 02/08/2019, 2:21 PM  [phone]

## 2019-02-09 LAB — GLUCOSE, CAPILLARY
Glucose-Capillary: 93 mg/dL (ref 70–99)
Glucose-Capillary: 163 mg/dL — ABNORMAL HIGH (ref 70–99)
Glucose-Capillary: 135 mg/dL — ABNORMAL HIGH (ref 70–99)
Glucose-Capillary: 118 mg/dL — ABNORMAL HIGH (ref 70–99)

## 2019-02-09 LAB — BASIC METABOLIC PANEL
Anion gap: 11 (ref 5–15)
BUN: 28 mg/dL — ABNORMAL HIGH (ref 8–23)
CO2: 31 mmol/L (ref 22–32)
Calcium: 8.6 mg/dL — ABNORMAL LOW (ref 8.9–10.3)
Chloride: 95 mmol/L — ABNORMAL LOW (ref 98–111)
Creatinine, Ser: 1.3 mg/dL — ABNORMAL HIGH (ref 0.44–1.00)
GFR calc Af Amer: 46 mL/min — ABNORMAL LOW (ref >60)
GFR calc non Af Amer: 39 mL/min — ABNORMAL LOW (ref >60)
Glucose, Bld: 136 mg/dL — ABNORMAL HIGH (ref 70–99)
Potassium: 4.7 mmol/L (ref 3.5–5.1)
Sodium: 137 mmol/L (ref 135–145)

## 2019-02-09 MED ORDER — FUROSEMIDE 40 MG PO TABS
40.0000 mg | ORAL_TABLET | Freq: Two times a day (BID) | ORAL | Status: DC
  Administered 2019-02-09 – 2019-02-13 (×6): 40 mg via ORAL
  Filled 2019-02-09 (×7): qty 1

## 2019-02-09 NOTE — Progress Notes (Signed)
PROGRESS NOTE  Teresa [name] [id number] DOB: [birth year] DOA: 02/02/2019 PCP: [name], MD  Brief History   78 year old chronically ill female with chronic systolic CHF, EF of 123456, severe MR, moderate TR, type 2 diabetes mellitus, stage III chronic chronic kidney disease, paroxysmal atrial fibrillation on Eliquis, CAD, hypothyroidism, multiple frequent hospitalizations with CHF was recently hospitalized and discharged few weeks ago with CHF exacerbation complicated by acute kidney injury. -Went to rehab and then went home 5 days ago, subsequently continued to decline with worsening dyspnea, hypoxemia, physical debility and decline, family was unable to provide the level of care that was needed for her at home and brought her to the emergency room where she was found to have acute on chronic systolic CHF again Hospitalization complicated by A. fib RVR. Cardiology was consulted. Rate is now controlled with digoxin and metoprolol.  The patient has been evaluated by PT/OT. They have recommended SNF. The patient is awaiting placement. Palliative care has been consulted. The patient's son has requested that the patient continue on the current course of treatment, remain a full code, and for watchful waiting.  Consultants   Psychiatry  Palliative Care  Cardiology  PCCM  Procedures   None  Antibiotics   Anti-infectives (From admission, onward)   None       Subjective  The patient is resting comforatbly. No new complaints.  Objective   Vitals:  Vitals:   02/09/19 1309 02/09/19 1548  BP: (!) 97/46 97/63  Pulse: 82 83  Resp: 20 20  Temp: 98 F (36.7 C)   SpO2: 98% 100%    Exam:  Constitutional:   Appears calm and comfortable Respiratory:   CTA bilaterally, no w/r/r.   Respiratory effort normal. No retractions or accessory muscle use Cardiovascular:   RRR, no m/r/g  No LE extremity edema    Normal pedal pulses Abdomen:   Abdomen appears normal; no  tenderness or masses  No hernias  No HSM Musculoskeletal:   No cyanosis, clubbing, or edema Skin:   No rashes, lesions, ulcers  palpation of skin: no induration or nodules Neurologic:   CN 2-12 intact  Sensation all 4 extremities intact Psychiatric:   Mental status o Mood, affect appropriate o Orientation to person, place, time   judgment and insight appear intact    I have personally reviewed the following:   Today's Data   Vitals, BMP   Scheduled Meds:  apixaban  5 mg Oral BID   busPIRone  7.5 mg Oral Daily   digoxin  0.125 mg Oral Daily   furosemide  40 mg Intravenous Q12H   levothyroxine  100 mcg Oral QAC breakfast   magnesium oxide  200 mg Oral BID   metoprolol succinate  50 mg Oral Daily   nystatin   Topical BID   potassium chloride  40 mEq Oral Daily   sodium chloride flush  3 mL Intravenous Q12H   Continuous Infusions:  sodium chloride      Principal Problem:   Acute on chronic systolic (congestive) heart failure (HCC) Active Problems:   Dyslipidemia   Essential (primary) hypertension   Diabetes mellitus type 2, diet-controlled (HCC)   Hypothyroidism   Anxiety and depression   Acute kidney injury superimposed on CKD (HCC)   Atrial fibrillation with RVR (HCC)   Dementia without behavioral disturbance (HCC)   Severe muscle deconditioning   LOS: 7 days    A & P  Acute on chronic systolic CHF exacerbation -Recurrent hospitalizations for same,  EF is 20% with severe MR and moderate TR, -Has been seen by palliative care multiple times, palliative focused care recommended by cardiology during recent hospitalization as well -Volume status improving, 9.247 L negative -Diuretic have been converted to oral today. Monitor.  -Continue Toprol  -Recent echo in July with EF of 20% and severe MR  -OVERALL prognosis is poor, Called and discussed with son [name], continue conservative management only at this point with goal to transition to SNF  with palliative care follow-up when bed available. Home with hospice is other option but family doesn't not think they can provide the care she needs at home. -PT OT, social work consulted for SNF -discharge planning  Atrial Fibrillation:  With RVR -Rate is controlled on metoprolol and digoxin -Continue Toprol at current dose -Continue Eliquis, Cardizem would be a poor option for rate control due to depressed EF  AKI on stage 3 CKD -Baseline creatinine around 1.8-2 range, 1.3 today. Likely cardiorenal.  HTN -Controlled on Toprol XL  DM -Diet controlled -Monitor CBGs, defer insulin for now unless it spikes  Anxiety/depression/dementia -Patient suffered loss of her daughter in July and has been declining since then. -She has underlying depression/anxiety and takes Buspar,, Klonopin -Based on prior hospitalization and son's report, strongly suspect underlying dementia that has been unmasked by recent life stressors and medical problems -She is pleasant, oriented to self and place at this time, but apparently experienced significant sundowning while in SNF and home  Hypothyroidism -Continue Synthroid at current dose  Deconditioning -Patient went to rehab following last hospitalization -She lived alone until march -She is now too difficult to care for in her son's home and he reports that she will need placement -SW consulted, PT/OT consulted, SNF recommended, awaiting placement.  DVT prophylaxis:Apixaban  Code Status:DNR- confirmed with son Family Communication: No family at bedside, called and discussed with son [name] 8/24 Disposition Plan: SNF with palliative care follow-up when bed available.  Jocilynn Grade, DO [facility] Direct contact: see [email or web address]  7PM-7AM contact night coverage as above 02/09/2019, 6:35 PM  LOS: 7 days

## 2019-02-10 ENCOUNTER — Inpatient Hospital Stay (HOSPITAL_COMMUNITY): Payer: Medicare Other

## 2019-02-10 LAB — GLUCOSE, CAPILLARY
Glucose-Capillary: 104 mg/dL — ABNORMAL HIGH (ref 70–99)
Glucose-Capillary: 135 mg/dL — ABNORMAL HIGH (ref 70–99)
Glucose-Capillary: 84 mg/dL (ref 70–99)
Glucose-Capillary: 131 mg/dL — ABNORMAL HIGH (ref 70–99)

## 2019-02-10 NOTE — Progress Notes (Signed)
C/O intermittent h/a and haleucinations. Dr. Benny Lennert notified. Order received for CT Head.

## 2019-02-10 NOTE — TOC Progression Note (Signed)
Transition of Care [facility]) - Progression Note    Patient Details  Name: [name] MRN: [id number] Date of Birth: 26-Jul-1940  Transition of Care [facility] CM/SW Contact  Eileen Stanford, LCSW Phone Number: 02/10/2019, 3:12 PM  Clinical Narrative:   CSW spoke with pt's son who is concerned for pt's decision making. [name] son states when he talks to his mom about going to SNF one day she sounds "with it" and will agree and then the next she is not "with it" and will say she is not going. [name] son has explained to pt that he can no longer care for her at his home and pt can not go home alone. [name] son is unsure if pt is going to be agreeable to SNF when it comes time to d/c. [name] son ask that CSW ask pt to see if she would be agreeable.   CSW spoke with pt. When asked about SNF pt states "I don't want to make this decision today." CSW explained to pt that pt was medically stable. Pt kept saying "now is not a good time." CSW explained that [facility] has offered pt a bed and again pt states this is not a good time. Pt also said she needed to see the facility prior to going there. CSW explained that pt would d/c straight to facility. CSW is unsure if pt is going to be agreeable. If pt is deemed alert and oriented at the time of d/c and refusing to go to SNF then pt's decisions stands unless MD determines pt doesn't not have the capacity to make this decision, in which a capacity exam should be completed. MD notified.   [name] son is on his way to speak to pt in person. [name] son is going to follow up with CSW after.     Expected Discharge Plan: [place] Barriers to Discharge: Continued Medical Work up  Expected Discharge Plan and Services Expected Discharge Plan: [place] In-house Referral: Clinical Social Work Discharge Planning Services: NA Post Acute Care Choice: [place] Living arrangements for the past 2 months: Single Family Home                                Social Determinants of Health (SDOH) Interventions    Readmission Risk Interventions Readmission Risk Prevention Plan 10/18/2018 10/15/2018  Transportation Screening - Complete  HRI or [place] - Complete  Social Work Consult for [place] Planning/Counseling Complete -  Palliative Care Screening - Not Applicable  Medication Review [employer]) - Complete  Some recent data might be hidden

## 2019-02-10 NOTE — Progress Notes (Addendum)
PROGRESS NOTE  Teresa [name] [id number] DOB: 18-Sep-1940 DOA: 02/02/2019 PCP: [name], MD  Brief History    78 year old chronically ill female with chronic systolic CHF, EF of 123456, severe MR, moderate TR, type 2 diabetes mellitus, stage III chronic chronic kidney disease, paroxysmal atrial fibrillation on Eliquis, CAD, hypothyroidism, multiple frequent hospitalizations with CHF was recently hospitalized and discharged few weeks ago with CHF exacerbation complicated by acute kidney injury. -Went to rehab and then went home 5 days ago, subsequently continued to decline with worsening dyspnea, hypoxemia, physical debility and decline, family was unable to provide the level of care that was needed for her at home and brought her to the emergency room where she was found to have acute on chronic systolic CHF again -Hospitalization complicated by A. fib RVR. The patient has been evaluated by PT/OT. They have recommended SNFplacement. She is awaiting placement.  Consultants   Psychiatry  Procedures   None  Antibiotics   Anti-infectives (From admission, onward)   None       Subjective  The patient is resting comfortably. No new complaints.  Objective   Vitals:  Vitals:   02/10/19 1018 02/10/19 1400  BP: 129/64 112/72  Pulse: 80 86  Resp: 16 20  Temp:  98.1 F (36.7 C)  SpO2: 97% 97%    Exam:  Constitutional:   The patient is awake and alert. She is not oriented to situation or date. No acute distress. Respiratory:   No increased work of breathing.   No wheezes, rales, or rhonchi.  Tactile fremitus Cardiovascular:   Regular rate and rhythm.  No murmurs, ectopy, or gallups  No lateral PMI. No thrills. Abdomen:   Abdomen is soft, non-tender, non-distended.  No hernias, masses, or organomegaly   Normoactive bowel sounds. Musculoskeletal:   No cyanosis, clubbing, or edema Skin:   No rashes, lesions, ulcers  palpation of skin: no induration or  nodules Neurologic:   CN 2-12 intact  Sensation all 4 extremities intact Psychiatric:   Mental status o Mood, affect appropriate o Orientation to person, place, time   judgment and insight appear intact   I have personally reviewed the following:   Today's Data   Vitals  Scheduled Meds:  apixaban  5 mg Oral BID   busPIRone  7.5 mg Oral Daily   digoxin  0.125 mg Oral Daily   furosemide  40 mg Oral BID   levothyroxine  100 mcg Oral QAC breakfast   magnesium oxide  200 mg Oral BID   metoprolol succinate  50 mg Oral Daily   nystatin   Topical BID   potassium chloride  40 mEq Oral Daily   sodium chloride flush  3 mL Intravenous Q12H   Continuous Infusions:  sodium chloride      Principal Problem:   Acute on chronic systolic (congestive) heart failure (HCC) Active Problems:   Dyslipidemia   Essential (primary) hypertension   Diabetes mellitus type 2, diet-controlled (HCC)   Hypothyroidism   Anxiety and depression   Acute kidney injury superimposed on CKD (HCC)   Atrial fibrillation with RVR (HCC)   Dementia without behavioral disturbance (HCC)   Severe muscle deconditioning   LOS: 8 days   A & P  Acute on chronic systolic CHF exacerbation: The patient has had recurrent hospitalizations for same, EF is 20% with severe MR and moderate TR. Palliative care has been involved in the patient's care many times in the past and palliative focused care recommended by cardiology  during recent hospitalization as well. On this inpatient stay the patient has a fluid balance that is negative for 10.645.7 Liters. She was converted to oral diuretics on 02/08/2019. OVERALL prognosis is poor, Called and discussed with son [name], continue conservative management only at this point with goal to transition to SNF with palliative care follow-up when bed available. Home with hospice is other option but family doesn't not think they can provide the care she needs at home. PT has  recommended that the patient be discharged to SNF, but the patient hasn't decided to go to SNF.   Atrial Fibrillation: With RVR. Rate is now controlled on metoprolol and digoxin. She is on Eliquis for stroke prophylaxis. Cardizem not preferred for rate controlled due to her very low EF.  AKI on stage 3 CKD: Baseline creatinine around 1.8-2 range, 1.3 today. Likely cardiorenal.  HTN: Controlled on Toprol XL.  DMII: Diet controlled. Monitor CBGs, defer insulin for now unless it spikes.  Anxiety/depression/dementia: Patient suffered loss of her daughter in July and has been declining since then. She has underlying depression/anxiety and takes Buspar and Klonopin. Based on prior hospitalization and son's report, strongly suspect underlying dementia that has been unmasked by recent life stressors and medical problems. Thismay be playing a significant role in the patient's inability to make decision about discharge plans. She is pleasant, oriented to self and place at this time, but apparently experienced significant sundowning while in SNF and home.  Hypothyroidism: Continue Synthroid at current dose  Deconditioning: Recommendation is for SNF at discharge. She is now too difficult to care for in her son's home and he reports that she will need placement. SW consulted, PT/OT consulted, SNF recommended, awaiting placement.  I have seen and examined this patient myself. I have spent 32 minutes in her evaluation and care.  DVT prophylaxis:Apixaban  Code Status:DNR- confirmed with son Family Communication:No family at bedside. Disposition Plan:SNF with palliative care follow-up when bed available.  Lewanna Petrak, DO [facility] Direct contact: see [email or web address]  7PM-7AM contact night coverage as above 02/10/2019, 3:31 PM  LOS: 8 days

## 2019-02-11 LAB — BASIC METABOLIC PANEL
Anion gap: 10 (ref 5–15)
BUN: 27 mg/dL — ABNORMAL HIGH (ref 8–23)
CO2: 29 mmol/L (ref 22–32)
Calcium: 8.8 mg/dL — ABNORMAL LOW (ref 8.9–10.3)
Chloride: 94 mmol/L — ABNORMAL LOW (ref 98–111)
Creatinine, Ser: 1.34 mg/dL — ABNORMAL HIGH (ref 0.44–1.00)
GFR calc Af Amer: 44 mL/min — ABNORMAL LOW (ref >60)
GFR calc non Af Amer: 38 mL/min — ABNORMAL LOW (ref >60)
Glucose, Bld: 103 mg/dL — ABNORMAL HIGH (ref 70–99)
Potassium: 4.4 mmol/L (ref 3.5–5.1)
Sodium: 133 mmol/L — ABNORMAL LOW (ref 135–145)

## 2019-02-11 LAB — GLUCOSE, CAPILLARY
Glucose-Capillary: 89 mg/dL (ref 70–99)
Glucose-Capillary: 102 mg/dL — ABNORMAL HIGH (ref 70–99)
Glucose-Capillary: 110 mg/dL — ABNORMAL HIGH (ref 70–99)
Glucose-Capillary: 120 mg/dL — ABNORMAL HIGH (ref 70–99)

## 2019-02-11 NOTE — TOC Progression Note (Signed)
Transition of Care [facility] - Progression Note    Patient Details  Name: Teresa [name] MRN: [id number] Date of Birth: [birth year]  Transition of Care [facility] CM/SW Contact  Eileen Stanford, LCSW Phone Number: 02/11/2019, 3:47 PM  Clinical Narrative:   CSW received a call from pt's son today stating pt is saying she will go to rehab she just wants to leave the hospital. [name] son states she keeps calling him saying she is scared. Pt has a facility willing to take her and COVID-19 test has been ordered. CSW went to bedside to confirm with pt that she is agreeable to go to SNF once her test results come back. In the beginning of the conversation pt was confused about "rehab." NT was also present in the room and spoke to pt as well and pt did agree to go to SNF. Then further in the conversation when CSW explained to pt that we would transfer her over there when her results came back she states "I don't want to make that decision today". Pt went back and forth upon agreeing and not agreeing to SNF. Pt seemed confused during the conversation--CSW continued to explain the process. Pt is aware she has no where to go, but still continues to go back and forth. Pt request that her son call her in 60 minutes--message was given to pt's son. [name] son is coming to the hospital tonight and will talk to pt. CSW notified facility that test was pending.    Expected Discharge Plan: [place] Barriers to Discharge: Continued Medical Work up  Expected Discharge Plan and Services Expected Discharge Plan: [place] In-house Referral: Clinical Social Work Discharge Planning Services: NA Post Acute Care Choice: [place] Living arrangements for the past 2 months: Single Family Home                                       Social Determinants of Health (SDOH) Interventions    Readmission Risk Interventions Readmission Risk Prevention Plan 10/18/2018 10/15/2018   Transportation Screening - Complete  HRI or [place] - Complete  Social Work Consult for [place] Planning/Counseling Complete -  Palliative Care Screening - Not Applicable  Medication Review Press photographer) - Complete  Some recent data might be hidden

## 2019-02-11 NOTE — Progress Notes (Signed)
PROGRESS NOTE  Teresa [name] [id number] DOB: 1940/06/30 DOA: 02/02/2019 PCP: [name], MD  Brief History    78 year old chronically ill female with chronic systolic CHF, EF of 123456, severe MR, moderate TR, type 2 diabetes mellitus, stage III chronic chronic kidney disease, paroxysmal atrial fibrillation on Eliquis, CAD, hypothyroidism, multiple frequent hospitalizations with CHF was recently hospitalized and discharged few weeks ago with CHF exacerbation complicated by acute kidney injury. -Went to rehab and then went home 5 days ago, subsequently continued to decline with worsening dyspnea, hypoxemia, physical debility and decline, family was unable to provide the level of care that was needed for her at home and brought her to the emergency room where she was found to have acute on chronic systolic CHF again -Hospitalization complicated by A. fib RVR. The patient has been evaluated by PT/OT. They have recommended SNFplacement. She is awaiting placement.  Consultants  None  Procedures   None  Antibiotics   Anti-infectives (From admission, onward)   None      Subjective  The patient is resting comfortably. No new complaints.  Objective   Vitals:  Vitals:   02/11/19 0520 02/11/19 1418  BP: 132/75 (!) 124/56  Pulse: 94 86  Resp:  (!) 22  Temp: 98.6 F (37 C) 98.7 F (37.1 C)  SpO2: 96% 98%    Exam:  Constitutional:   The patient is awake and alert. She is not oriented to situation or date. No acute distress. She is refusing to answer questions for me that would allow me to determine if she has capacity. Respiratory:   No increased work of breathing.   No wheezes, rales, or rhonchi.  Tactile fremitus Cardiovascular:   Regular rate and rhythm.  No murmurs, ectopy, or gallups  No lateral PMI. No thrills. Abdomen:   Abdomen is soft, non-tender, non-distended.  No hernias, masses, or organomegaly   Normoactive bowel sounds. Musculoskeletal:    No cyanosis, clubbing, or edema Skin:   No rashes, lesions, ulcers  palpation of skin: no induration or nodules Neurologic:   CN 2-12 intact  Sensation all 4 extremities intact Psychiatric:   Mental status o Mood, affect appropriate o Orientation to person, place, time   judgment and insight appear intact   I have personally reviewed the following:   Today's Data   Vitals  Scheduled Meds:  apixaban  5 mg Oral BID   busPIRone  7.5 mg Oral Daily   digoxin  0.125 mg Oral Daily   furosemide  40 mg Oral BID   levothyroxine  100 mcg Oral QAC breakfast   magnesium oxide  200 mg Oral BID   metoprolol succinate  50 mg Oral Daily   nystatin   Topical BID   potassium chloride  40 mEq Oral Daily   sodium chloride flush  3 mL Intravenous Q12H   Continuous Infusions:  sodium chloride      Principal Problem:   Acute on chronic systolic (congestive) heart failure (HCC) Active Problems:   Dyslipidemia   Essential (primary) hypertension   Diabetes mellitus type 2, diet-controlled (HCC)   Hypothyroidism   Anxiety and depression   Acute kidney injury superimposed on CKD (HCC)   Atrial fibrillation with RVR (HCC)   Dementia without behavioral disturbance (HCC)   Severe muscle deconditioning   LOS: 9 days   A & P  Acute on chronic systolic CHF exacerbation: The patient has had recurrent hospitalizations for same, EF is 20% with severe MR and moderate TR.  Palliative care has been involved in the patient's care many times in the past and palliative focused care recommended by cardiology during recent hospitalization as well. On this inpatient stay the patient has a fluid balance that is negative for 10.645.7 Liters. She was converted to oral diuretics on 02/08/2019. OVERALL prognosis is poor, Called and discussed with son [name], continue conservative management only at this point with goal to transition to SNF with palliative care follow-up when bed available. Home with  hospice is other option but family doesn't not think they can provide the care she needs at home. Pt son spoke with the patient and she is now agreeable to SNF. COVID-19 is being re-checked.  Atrial Fibrillation: With RVR. Rate is now controlled on metoprolol and digoxin. She is on Eliquis for stroke prophylaxis. Cardizem not preferred for rate controlled due to her very low EF.  AKI on stage 3 CKD: Baseline creatinine around 1.8-2 range, 1.34 today. Likely cardiorenal.  HTN: Controlled on Toprol XL.  DMII: Diet controlled. Monitor CBGs, defer insulin for now unless it spikes.  Anxiety/depression/dementia: Patient suffered loss of her daughter in July and has been declining since then. She has underlying depression/anxiety and takes Buspar and Klonopin. Based on prior hospitalization and son's report, strongly suspect underlying dementia that has been unmasked by recent life stressors and medical problems. Thismay be playing a significant role in the patient's inability to make decision about discharge plans. She is pleasant, oriented to self and place at this time, but apparently experienced significant sundowning while in SNF and home.  Hypothyroidism: Continue Synthroid at current dose  Deconditioning: Recommendation is for SNF at discharge. She is now too difficult to care for in her son's home and he reports that she will need placement. SW consulted, PT/OT consulted, SNF recommended, awaiting placement.  I have seen and examined this patient myself. I have spent 36 minutes in her evaluation and care.  DVT prophylaxis:Apixaban  Code Status:DNR- confirmed with son Family Communication:No family at bedside. Disposition Plan:SNF with palliative care follow-up when bed available.  Savon Bordonaro, DO [facility] Direct contact: see [email or web address]  7PM-7AM contact night coverage as above 02/11/2019, 5:46 PM  LOS: 8 days

## 2019-02-11 NOTE — Care Management Important Message (Signed)
Important Message  Patient Details  Name: Teresa [name] MRN: [id number] Date of Birth: [birth year]   Medicare Important Message Given:  Yes     [name] 02/11/2019, 1:08 PM

## 2019-02-11 NOTE — TOC Progression Note (Signed)
Transition of Care [facility]) - Progression Note    Patient Details  Name: Teresa [name] MRN: [id number] Date of Birth: 1940-11-26  Transition of Care [facility]) CM/SW Contact  Eileen Stanford, LCSW Phone Number: 02/11/2019, 11:30 AM  Clinical Narrative:   Pt is refusing to go to SNF and refusing to speak to MD, in order to complete a capacity exam. Psych is consulted. CSW will continue to follow.    Expected Discharge Plan: [place] Barriers to Discharge: Continued Medical Work up  Expected Discharge Plan and Services Expected Discharge Plan: [place] In-house Referral: Clinical Social Work Discharge Planning Services: NA Post Acute Care Choice: [place] Living arrangements for the past 2 months: Single Family Home                                       Social Determinants of Health (SDOH) Interventions    Readmission Risk Interventions Readmission Risk Prevention Plan 10/18/2018 10/15/2018  Transportation Screening - Complete  HRI or [place] - Complete  Social Work Consult for [place] Planning/Counseling Complete -  Palliative Care Screening - Not Applicable  Medication Review Press photographer) - Complete  Some recent data might be hidden

## 2019-02-11 NOTE — Progress Notes (Signed)
Physical Therapy Treatment Patient Details Name: Teresa [name] MRN: [id number] DOB: 1941/04/12 Today's Date: 02/11/2019    History of Present Illness Teresa [name] is a 78 y.o. 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 She was admitted to the [facility] service from 7/14-29 and discharged to SNF; she has since returned home.     PT Comments    Pt is asleep on entry however rouses to verbal and tactile cuing. Pt reports not wanting to sit in chair, however agreeable to sit up for a drink of water. Pt rambling about being "tired" and " wanting to see her son", and "seeing angels." Pt requires maxAx2 for bed mobility today and ultimately request to return to bed. D/c plans remain appropriate.    Follow Up Recommendations  SNF     Equipment Recommendations  Other (comment)(TBD)       Precautions / Restrictions Precautions Precautions: Fall Restrictions Weight Bearing Restrictions: No    Mobility  Bed Mobility Overal bed mobility: Needs Assistance Bed Mobility: Rolling;Sidelying to Sit Rolling: Max assist;+2 for physical assistance Sidelying to sit: +2 for physical assistance;Max assist Supine to sit: Max assist;+2 for physical assistance     General bed mobility comments: maxAx2 for bed mobility today, initially not wanting to move, however agreeable to get to EoB to drink some ater, once in sitting pt reports she is very tired, in return to supine found to be incontinent of stool , requires maxAx2 for rolling for pericare  Transfers                 General transfer comment: pt refuses to stand once EoB      Balance Overall balance assessment: Needs assistance Sitting-balance support: No upper extremity supported;Feet supported Sitting balance-Leahy  Scale: Fair                                      Cognition Arousal/Alertness: Awake/alert Behavior During Therapy: WFL for tasks assessed/performed;Flat affect Overall Cognitive Status: No family/caregiver present to determine baseline cognitive functioning                                 General Comments: Pt asleep on entry but roused to name and lights being turned on. Pt reports "I tired, "I'm ready to go."         General Comments General comments (skin integrity, edema, etc.): Pt incontient of stool and urine, periarea noted to be red and painful, RN treated with medicated powder       Pertinent Vitals/Pain Pain Assessment: Faces Faces Pain Scale: Hurts little more Pain Location: generalized with movement Pain Descriptors / Indicators: Sore;Grimacing;Guarding Pain Intervention(s): Limited activity within patient's tolerance;Monitored during session;Repositioned           PT Goals (current goals can now be found in the care plan section) Acute Rehab PT Goals PT Goal Formulation: With patient Time For Goal Achievement: 02/17/19 Potential to Achieve Goals: Fair Progress towards PT goals: Not progressing toward goals - comment(lethargic)    Frequency    Min 2X/week      PT Plan Current plan remains appropriate;Frequency needs to be updated  AM-PAC PT "6 Clicks" Mobility   Outcome Measure  Help needed turning from your back to your side while in a flat bed without using bedrails?: A Little Help needed moving from lying on your back to sitting on the side of a flat bed without using bedrails?: A Lot Help needed moving to and from a bed to a chair (including a wheelchair)?: A Lot Help needed standing up from a chair using your arms (e.g., wheelchair or bedside chair)?: A Lot Help needed to walk in hospital room?: Total Help needed climbing 3-5 steps with a railing? : Total 6 Click Score: 11    End of Session Equipment Utilized  During Treatment: Gait belt;Oxygen Activity Tolerance: Patient limited by fatigue;Patient limited by lethargy Patient left: in bed;with bed alarm set Nurse Communication: Mobility status PT Visit Diagnosis: Unsteadiness on feet (R26.81);Other abnormalities of gait and mobility (R26.89);Muscle weakness (generalized) (M62.81);Difficulty in walking, not elsewhere classified (R26.2)     Time: [id number] PT Time Calculation (min) (ACUTE ONLY): 22 min  Charges:  $Therapeutic Activity: 8-22 mins                     Mohamadou Maciver B. Migdalia Dk PT, DPT Acute Rehabilitation Services Pager [phone] Office [phone]    [place] 02/11/2019, 11:33 AM

## 2019-02-12 LAB — NOVEL CORONAVIRUS, NAA (HOSP ORDER, SEND-OUT TO REF LAB; TAT 18-24 HRS): SARS-CoV-2, NAA: NOT DETECTED

## 2019-02-12 LAB — GLUCOSE, CAPILLARY
Glucose-Capillary: 95 mg/dL (ref 70–99)
Glucose-Capillary: 115 mg/dL — ABNORMAL HIGH (ref 70–99)
Glucose-Capillary: 164 mg/dL — ABNORMAL HIGH (ref 70–99)

## 2019-02-12 MED ORDER — FUROSEMIDE 40 MG PO TABS: 40.0000 mg | ORAL_TABLET | Freq: Two times a day (BID) | ORAL | 0 refills | Status: DC

## 2019-02-12 MED ORDER — NYSTATIN 100000 UNIT/GM EX POWD: Freq: Two times a day (BID) | CUTANEOUS | 0 refills | Status: DC

## 2019-02-12 MED ORDER — BUSPIRONE HCL 7.5 MG PO TABS: 7.5000 mg | ORAL_TABLET | Freq: Every day | ORAL | 0 refills | Status: DC

## 2019-02-12 MED ORDER — DIGOXIN 125 MCG PO TABS: 0.1250 mg | ORAL_TABLET | Freq: Every day | ORAL | 0 refills | Status: DC

## 2019-02-12 MED ORDER — POTASSIUM CHLORIDE CRYS ER 20 MEQ PO TBCR: 40.0000 meq | EXTENDED_RELEASE_TABLET | Freq: Every day | ORAL | 0 refills | Status: DC

## 2019-02-12 NOTE — Progress Notes (Signed)
PROGRESS NOTE  Teresa [name] [id number] DOB: March 19, 1941 DOA: 02/02/2019 PCP: [name], MD  Brief History    78 year old chronically ill female with chronic systolic CHF, EF of 123456, severe MR, moderate TR, type 2 diabetes mellitus, stage III chronic chronic kidney disease, paroxysmal atrial fibrillation on Eliquis, CAD, hypothyroidism, multiple frequent hospitalizations with CHF was recently hospitalized and discharged few weeks ago with CHF exacerbation complicated by acute kidney injury. -Went to rehab and then went home 5 days ago, subsequently continued to decline with worsening dyspnea, hypoxemia, physical debility and decline, family was unable to provide the level of care that was needed for her at home and brought her to the emergency room where she was found to have acute on chronic systolic CHF again -Hospitalization complicated by A. fib RVR. The patient has been evaluated by PT/OT. They have recommended SNFplacement. She is awaiting placement.  Consultants  None  Procedures   None  Antibiotics   Anti-infectives (From admission, onward)   None      Subjective  The patient is sleeping soundly. She is not awakened.  Objective   Vitals:  Vitals:   02/12/19 1312 02/12/19 1357  BP: 127/71 130/75  Pulse: 80 (!) 111  Resp:  20  Temp:  98.3 F (36.8 C)  SpO2:  98%    Exam:  Constitutional:  The patient is sleeping soundly this morning. She is in no acute distress. Respiratory:   No increased work of breathing.   No wheezes, rales, or rhonchi.  Tactile fremitus Cardiovascular:   Regular rate and rhythm.  No murmurs, ectopy, or gallups  No lateral PMI. No thrills. Abdomen:   Abdomen is soft, non-tender, non-distended.  No hernias, masses, or organomegaly   Normoactive bowel sounds. Musculoskeletal:   No cyanosis, clubbing, or edema Skin:   No rashes, lesions, ulcers  palpation of skin: no induration or nodules Neurologic:   CN  2-12 intact  Sensation all 4 extremities intact Psychiatric:   Mental status o Mood, affect appropriate o Orientation to person, place, time   judgment and insight appear intact   I have personally reviewed the following:   Today's Data   Vitals  Scheduled Meds:  apixaban  5 mg Oral BID   busPIRone  7.5 mg Oral Daily   digoxin  0.125 mg Oral Daily   furosemide  40 mg Oral BID   levothyroxine  100 mcg Oral QAC breakfast   magnesium oxide  200 mg Oral BID   metoprolol succinate  50 mg Oral Daily   nystatin   Topical BID   potassium chloride  40 mEq Oral Daily   sodium chloride flush  3 mL Intravenous Q12H   Continuous Infusions:  sodium chloride      Principal Problem:   Acute on chronic systolic (congestive) heart failure (HCC) Active Problems:   Dyslipidemia   Essential (primary) hypertension   Diabetes mellitus type 2, diet-controlled (HCC)   Hypothyroidism   Anxiety and depression   Acute kidney injury superimposed on CKD (HCC)   Atrial fibrillation with RVR (HCC)   Dementia without behavioral disturbance (HCC)   Severe muscle deconditioning   LOS: 10 days   A & P  Acute on chronic systolic CHF exacerbation: The patient has had recurrent hospitalizations for same, EF is 20% with severe MR and moderate TR. Palliative care has been involved in the patient's care many times in the past and palliative focused care recommended by cardiology during recent hospitalization as well.  On this inpatient stay the patient has a fluid balance that is negative for 10.645.7 Liters. She was converted to oral diuretics on 02/08/2019. OVERALL prognosis is poor, Called and discussed with son Teresa [name], continue conservative management only at this point with goal to transition to SNF with palliative care follow-up when bed available. Home with hospice is other option but family doesn't not think they can provide the care she needs at home. Pt son spoke with the patient and she is  now agreeable to SNF. COVID-19 is being re-checked.  Atrial Fibrillation: With RVR. Rate is now controlled on metoprolol and digoxin. She is on Eliquis for stroke prophylaxis. Cardizem not preferred for rate controlled due to her very low EF.  AKI on stage 3 CKD: Baseline creatinine around 1.8-2 range, 1.34 today. Likely cardiorenal.  HTN: Controlled on Toprol XL.  DMII: Diet controlled. Monitor CBGs, defer insulin for now unless it spikes.  Anxiety/depression/dementia: Patient suffered loss of her daughter in July and has been declining since then. She has underlying depression/anxiety and takes Buspar and Klonopin. Based on prior hospitalization and son's report, strongly suspect underlying dementia that has been unmasked by recent life stressors and medical problems. Thismay be playing a significant role in the patient's inability to make decision about discharge plans. She is pleasant, oriented to self and place at this time, but apparently experienced significant sundowning while in SNF and home.  Hypothyroidism: Continue Synthroid at current dose  Deconditioning: Recommendation is for SNF at discharge. She is now too difficult to care for in her son's home and he reports that she will need placement. SW consulted, PT/OT consulted, SNF recommended, awaiting placement.  I have seen and examined this patient myself. I have spent 28 minutes in her evaluation and care.  DVT prophylaxis:Apixaban  Code Status:DNR- confirmed with son Family Communication:No family at bedside. Disposition Plan:SNF with palliative care follow-up when bed available.  Teresa Collymore, DO [facility] Direct contact: see [email or web address]  7PM-7AM contact night coverage as above 02/12/2019, 2:13 PM  LOS: 8 days

## 2019-02-12 NOTE — Progress Notes (Signed)
Pending COVID test results from 8/28 prior to discharge to [facility], continuing to follow at this time.   [place], [place]

## 2019-02-13 DIAGNOSIS — I4891 Unspecified atrial fibrillation: Secondary | ICD-10-CM

## 2019-02-13 DIAGNOSIS — F419 Anxiety disorder, unspecified: Secondary | ICD-10-CM

## 2019-02-13 DIAGNOSIS — N189 Chronic kidney disease, unspecified: Secondary | ICD-10-CM

## 2019-02-13 DIAGNOSIS — F329 Major depressive disorder, single episode, unspecified: Secondary | ICD-10-CM

## 2019-02-13 DIAGNOSIS — N179 Acute kidney failure, unspecified: Secondary | ICD-10-CM

## 2019-02-13 LAB — GLUCOSE, CAPILLARY
Glucose-Capillary: 120 mg/dL — ABNORMAL HIGH (ref 70–99)
Glucose-Capillary: 179 mg/dL — ABNORMAL HIGH (ref 70–99)

## 2019-02-13 MED ORDER — ALBUTEROL SULFATE HFA 108 (90 BASE) MCG/ACT IN AERS: INHALATION_SPRAY | RESPIRATORY_TRACT | 0 refills | Status: AC

## 2019-02-13 MED ORDER — MAGNESIUM OXIDE 400 (241.3 MG) MG PO TABS: 200.0000 mg | ORAL_TABLET | Freq: Two times a day (BID) | ORAL | 0 refills | Status: AC

## 2019-02-13 MED ORDER — TORSEMIDE 20 MG PO TABS: 20.0000 mg | ORAL_TABLET | Freq: Two times a day (BID) | ORAL | 0 refills | Status: AC

## 2019-02-13 MED ORDER — CLONAZEPAM 0.25 MG PO TBDP: 0.2500 mg | ORAL_TABLET | Freq: Two times a day (BID) | ORAL | 0 refills | Status: AC | PRN

## 2019-02-13 MED ORDER — APIXABAN 5 MG PO TABS: 5.0000 mg | ORAL_TABLET | Freq: Two times a day (BID) | ORAL | 2 refills | Status: AC

## 2019-02-13 MED ORDER — POLYETHYLENE GLYCOL 3350 17 G PO PACK: 17.0000 g | PACK | Freq: Every day | ORAL | 0 refills | Status: AC | PRN

## 2019-02-13 MED ORDER — BUSPIRONE HCL 7.5 MG PO TABS: 7.5000 mg | ORAL_TABLET | Freq: Every day | ORAL | 0 refills | Status: AC

## 2019-02-13 MED ORDER — MELATONIN 3 MG PO TABS: 3.0000 mg | ORAL_TABLET | Freq: Every evening | ORAL | 0 refills | Status: AC | PRN

## 2019-02-13 MED ORDER — METOPROLOL SUCCINATE ER 50 MG PO TB24: 50.0000 mg | ORAL_TABLET | Freq: Every day | ORAL | 11 refills | Status: AC

## 2019-02-13 MED ORDER — LEVOTHYROXINE SODIUM 100 MCG PO TABS: 100.0000 ug | ORAL_TABLET | Freq: Every day | ORAL | 0 refills | Status: AC

## 2019-02-13 MED ORDER — DIGOXIN 125 MCG PO TABS: 0.1250 mg | ORAL_TABLET | Freq: Every day | ORAL | 0 refills | Status: AC

## 2019-02-13 MED ORDER — POTASSIUM CHLORIDE CRYS ER 20 MEQ PO TBCR: 40.0000 meq | EXTENDED_RELEASE_TABLET | Freq: Every day | ORAL | 0 refills | Status: AC

## 2019-02-13 MED ORDER — NYSTATIN 100000 UNIT/GM EX POWD: Freq: Two times a day (BID) | CUTANEOUS | 0 refills | Status: AC

## 2019-02-13 NOTE — Plan of Care (Signed)

## 2019-02-13 NOTE — TOC Transition Note (Signed)
Transition of Care [facility]) - CM/SW Discharge Note   Patient Details  Name: Teresa [name] MRN: [id number] Date of Birth: 1941-01-21  Transition of Care [facility] CM/SW Contact:  Bary Castilla, LCSW Phone Number: [phone] 02/13/2019, 2:06 PM   Clinical Narrative:     CSW has monitored chart in antiipcation of patient being discharged today.  Patient will DC [id number] PG? Anticipated DC date:02/13/19? Family notified:?Son Transport [id number]   Per MD patient ready for DC to [facility]. RN, patient, patient's family, and facility notified of DC. Discharge Summary sent to facility. RN given number for report [id number] room 310. DC packet on chart. Ambulance transport requested for patient.  CSW signing off.   [place], [place] [phone] >d  Final next level of care: Skilled Nursing Facility Barriers to Discharge: No Barriers Identified   Patient Goals and CMS Choice Patient states their goals for this hospitalization and ongoing recovery are:: to get better CMS Medicare.gov Compare Post Acute Care list provided to:: Patient Represenative (must comment) Choice offered to / list presented to : Adult Children  Discharge Placement              Patient chooses bed at: [facility] [place]   Name of family member notified: Son Patient and family notified of of transfer: 02/13/19  Discharge Plan and Services In-house Referral: Clinical Social Work Discharge Planning Services: NA Post Acute Care Choice: [place]                               Social Determinants of Health (SDOH) Interventions     Readmission Risk Interventions Readmission Risk Prevention Plan 10/18/2018 10/15/2018  Transportation Screening - Complete  HRI or [place] - Complete  Social Work Consult for [place] Planning/Counseling Complete -  Palliative Care Screening - Not Applicable  Medication Review Press photographer) - Complete  Some recent data  might be hidden

## 2019-02-13 NOTE — Progress Notes (Signed)
Rx for Clonazepam faxed to [facility] per facility request. Paper Rx with packet.

## 2019-02-13 NOTE — Discharge Summary (Signed)
Physician Discharge Summary  Patient ID: Teresa [name] MRN: [id number] DOB/AGE: 1940-12-27 78 y.o.  Admit date: 02/02/2019 Discharge date: 02/13/2019  Admission Diagnoses:  Discharge Diagnoses:  Principal Problem:   Acute on chronic systolic (congestive) heart failure (HCC) Active Problems:   Dyslipidemia   Essential (primary) hypertension   Diabetes mellitus type 2, diet-controlled (HCC)   Hypothyroidism   Anxiety and depression   Acute kidney injury superimposed on CKD (HCC)   Atrial fibrillation with RVR (HCC)   Dementia without behavioral disturbance (HCC)   Severe muscle deconditioning   Discharged Condition: stable  Hospital Course: Patient is a 78 year old Caucasian female with past medical history significant for chronic systolic CHF (EF of 123456), severe MR, moderate TR, type 2 diabetes mellitus, stage III chronic chronic kidney disease, paroxysmal atrial fibrillation on Eliquis, CAD, hypothyroidism, multiple frequent hospitalizations with CHF was recently hospitalized and discharged few weeks ago with CHF exacerbation complicated by acute kidney injury.  Patient went to rehab unit and was discharged 5 days prior to presentation.  Patient continued to decline with worsening dyspnea, hypoxemia, physical debility and decline.  Patient's family was unable to provide the level of care that was needed for her at home leading to representation to the hospital.  Patient was admitted and managed for congestive heart failure exacerbation that was complicated by atrial fibrillation with rapid ventricular response.  Patient has been optimized.  Patient was assessed by the physical and occupational therapy team and skilled nursing facility placement was recommended.  Patient will be discharged to a skilled nursing facility today.   Acute on chronic systolic CHF exacerbation: The patient has had recurrent hospitalizations for same, EF is 20% with severe MR and moderate TR. Palliative care has  been involved in the patient's care several times in the past and palliative focused care recommended by cardiology during recent hospitalization as well.  Patient has been aggressively diuresed. OVERALL prognosis is poor.  Patient will be discharged to the skilled nursing facility.  Atrial Fibrillation: With RVR. Rate is controlled on metoprolol and digoxin.  Patient is on anticoagulation (Eliquis).    AKI on stage 3 CKD:  AKI has resolved.  Possibly cardiorenal.  Patient renal function is back to baseline.    Hypertension: Controlled onToprol XL.  DMII: Diet controlled. Monitor blood sugar closely.    Anxiety/depression/dementia: Patient suffered loss of her daughter in July and has been declining since then. She has underlying depression/anxiety and takes Buspar and Klonopin.  There are some indications that patient may have undiagnosed dementing illness.  Please continue to assess at the skilled nursing facility.  Hypothyroidism: Continue Synthroid at current dose  Deconditioning: Patient was seen by the PT OT.  Skilled nursing facility has been recommended.  Patient will be discharged to skilled nursing facility today.    Consults: None  Discharge Exam: Blood pressure 114/71, pulse 86, temperature 98 F (36.7 C), temperature source Oral, resp. rate (!) 23, height 5\' 3"  (1.6 m), weight 85.5 kg, SpO2 97 %.  Disposition: Discharge disposition: 03-Skilled Nursing Facility   Discharge Instructions    Diet - low sodium heart healthy   Complete by: As directed    Increase activity slowly   Complete by: As directed      Allergies as of 02/13/2019      Reactions   Ambien [zolpidem Tartrate] Other (See Comments)     Severe lethargy confusion and restlessness   Aspirin Other (See Comments)   HX Bleeding Ulcer    Nsaids  Other (See Comments)   Hx of bleeding ulcers   Tolmetin Rash   Hx of bleeding ulcers      Medication List    STOP taking these medications   citalopram  20 MG tablet Commonly known as: CELEXA   famotidine 20 MG tablet Commonly known as: PEPCID   HYDROcodone-acetaminophen 5-325 MG tablet Commonly known as: NORCO/VICODIN   potassium chloride 10 MEQ CR capsule Commonly known as: MICRO-K     TAKE these medications   AeroChamber MV inhaler Use as instructed   albuterol 108 (90 Base) MCG/ACT inhaler Commonly known as: ProAir HFA USE 2 PUFFS EVERY 6 HOURS AS NEEDED FOR SHORTNESS OF BREATH AND WHEEZING.   apixaban 5 MG Tabs tablet Commonly known as: ELIQUIS Take 1 tablet (5 mg total) by mouth 2 (two) times daily.   busPIRone 7.5 MG tablet Commonly known as: BUSPAR Take 1 tablet (7.5 mg total) by mouth daily.   clonazePAM 0.25 MG disintegrating tablet Commonly known as: KLONOPIN Take 1 tablet (0.25 mg total) by mouth 2 (two) times daily as needed (anxiety . avoid during bedtime).   digoxin 0.125 MG tablet Commonly known as: LANOXIN Take 1 tablet (0.125 mg total) by mouth daily.   levothyroxine 100 MCG tablet Commonly known as: SYNTHROID Take 1 tablet (100 mcg total) by mouth daily before breakfast.   magnesium oxide 400 (241.3 Mg) MG tablet Commonly known as: MAG-OX Take 0.5 tablets (200 mg total) by mouth 2 (two) times daily.   Melatonin 3 MG Tabs Take 1 tablet (3 mg total) by mouth at bedtime as needed (PRN sleep).   metoprolol succinate 50 MG 24 hr tablet Commonly known as: Toprol XL Take 1 tablet (50 mg total) by mouth daily. Take with or immediately following a meal.   nystatin powder Commonly known as: MYCOSTATIN/NYSTOP Apply topically 2 (two) times daily.   polyethylene glycol 17 g packet Commonly known as: MIRALAX / GLYCOLAX Take 17 g by mouth daily as needed for mild constipation.   potassium chloride SA 20 MEQ tablet Commonly known as: K-DUR Take 2 tablets (40 mEq total) by mouth daily.   torsemide 20 MG tablet Commonly known as: Demadex Take 1 tablet (20 mg total) by mouth 2 (two) times daily. What  changed:   how much to take  when to take this      Contact information for after-discharge care    Destination    [facility] Preferred SNF .   Service: Skilled Nursing Contact information: [place] [phone]              Signed: Bonnell Public 02/13/2019, 12:43 PM

## 2019-02-27 DIAGNOSIS — I480 Paroxysmal atrial fibrillation: Secondary | ICD-10-CM | POA: Insufficient documentation

## 2019-02-27 NOTE — Progress Notes (Deleted)
Cardiology Office Note   Date:  02/27/2019   ID:  Teresa [name], DOB 09-Sep-1940, MRN [id number]  PCP:  [name], MD  Cardiologist:   Minus Breeding, MD Referring:  ***  No chief complaint on file.     History of Present Illness: Teresa [name] is a 78 y.o. female who presents who presents for follow up of CHF, exacerbated by acute GI illness, atrial fib, on Eliquis and Toprol, s/p DCCV with conversion to NSR.  ***    She has a high likelihood of returning to atrial fib in the setting of moderate mitral regurg.   She has other history of depression and anxiety, hypothyroidism, and Type II diabetes. With chronic pain in her shoulders.   She was discharged on 08/17/2018, diagnosed with acute GI illness but negative for C-Diff. She was discharged on low dose lasix, 20 mg daily. She was taken off of spironolactone, losartan and carvedilol. She was started on metoprolol succinate 50 mg BID.   She states that she has no way of checking her BP at home. Her son cares for her but she has not been getting out to have her readings done. She is medically compliant as her son provides all of her medications in a pill dispenser.   She has not felt her HR being rapid or irregular. She is sensitive to this and has not experienced this now. She has been feeling less fatigued . No bleeding or excessive bruising. No DOE. She is staying home as directed.     Past Medical History:  Diagnosis Date  . Abdominal pain   . Anxiety    maintained on Xanax tid for years.  . Arthritis    FEET, KNEES, HANDS  . Atrial fibrillation ([place]) 09/2018  . Chronic systolic CHF (congestive heart failure), NYHA class 2 ([place])    EF 25% 2011 MV,  50-55% echo 6/13  . Diabetes mellitus type 2, diet-controlled ([place])   . GERD (gastroesophageal reflux disease)   . Glaucoma    s/p surgery B in 30s.  [place].  . H/O hiatal hernia   . History of glaucoma   . History of small bowel obstruction 2009 AND NOV 2012   . HTN (hypertension)   . Hypothyroidism   . Left ovarian cyst   . Moderate mitral regurgitation   . Moderate tricuspid regurgitation   . Non-healing surgical wound    ABDOMINAL S/P EXPLORATOY LAPAROTOMY 04-18-2011  . Nonischemic cardiomyopathy ([place]) [facility]    EF is 25% per echo February 2012, non ischemic myoview 12/2009.  EF 50% echo 7/12 and plans for ICD cancelled.   . Peptic ulcer disease   . PVC (premature ventricular contraction)   . Spinal stenosis   . Urge urinary incontinence   . Valvular heart disease    Moderate to severe MR, moderate TR, LAE per echo 7/11  . Wears dentures    upper denture-lower partial    Past Surgical History:  Procedure Laterality Date  . ABDOMINAL HERNIA REPAIR  32 YRS AGO   PERITINITIS  . APPENDECTOMY  1979   RUPTURED  . BENIGN RIGHT BREAST TUMOR REMOVED    . CARDIOVASCULAR STRESS TEST  JULY 2011-  DR Shalamar Crays   NO ISCHEMIA  . CARDIOVERSION N/A 08/17/2018   Procedure: CARDIOVERSION;  Surgeon: Sueanne Margarita, MD;  Location: [facility] ENDOSCOPY;  Service: Cardiovascular;  Laterality: N/A;  . EXPLORATOMY LAP. / EXTENSIVE LYSIS ADHESIONS/ SMALL BOWEL RESECTION X2 WITH PRIMARY ANASTOMOSIS  X2  04-18-2011   SMALL BOWEL OBSTRUCTION  . GLAUCOMA SURGERY  1978  . HERNIA REPAIR    . INCISION AND DRAINAGE OF WOUND N/A 09/13/2012   Procedure: INCISION  AND DEBRIDEMENT WOUND abdominal wall ;  Surgeon: Rolm Bookbinder, MD;  Location: [place];  Service: General;  Laterality: N/A;  coordination with Dr Migdalia Dk   . KNEE ARTHROSCOPY W/ MENISCECTOMY  06-05-2004  . TEE WITHOUT CARDIOVERSION N/A 08/17/2018   Procedure: TRANSESOPHAGEAL ECHOCARDIOGRAM (TEE);  Surgeon: Sueanne Margarita, MD;  Location: [facility] ENDOSCOPY;  Service: Cardiovascular;  Laterality: N/A;  . THYROIDECTOMY  1978 GOITOR   AND CERVICAL COLD KNIFE CONE BX  . TONSILLECTOMY  AGE 18  . TRANSTHORACIC ECHOCARDIOGRAM  12-27-2010   EF 45-50%/ MODERATE MV REGURG. / LEFT ATRIUM MILDLY DILATED/ MILD TRICUSPID REGURG.   . TUBAL LIGATION    . VENTRAL HERNIA REPAIR  X4  LAST ONE 20 YRS AGO   ABDOMINAL --  EACH ONE IN DIFFERENT AREA  . VENTRAL HERNIA REPAIR  01/29/2012   Procedure: HERNIA REPAIR VENTRAL ADULT;  Surgeon: Theodoro Kos, DO;  Location: [place];  Service: Plastics;  Laterality: N/A;  . WOUND DEBRIDEMENT  09/25/2011   Procedure: DEBRIDEMENT CLOSURE/ABDOMINAL WOUND;  Surgeon: Theodoro Kos, DO;  Location: [place];  Service: Plastics;  Laterality: N/A;  excision of abdominal wound with primary closure     Current Outpatient Medications  Medication Sig Dispense Refill  . albuterol (PROAIR HFA) 108 (90 Base) MCG/ACT inhaler USE 2 PUFFS EVERY 6 HOURS AS NEEDED FOR SHORTNESS OF BREATH AND WHEEZING. 8.5 g 0  . apixaban (ELIQUIS) 5 MG TABS tablet Take 1 tablet (5 mg total) by mouth 2 (two) times daily. 60 tablet 2  . busPIRone (BUSPAR) 7.5 MG tablet Take 1 tablet (7.5 mg total) by mouth daily. 30 tablet 0  . clonazePAM (KLONOPIN) 0.25 MG disintegrating tablet Take 1 tablet (0.25 mg total) by mouth 2 (two) times daily as needed (anxiety . avoid during bedtime). 10 tablet 0  . digoxin (LANOXIN) 0.125 MG tablet Take 1 tablet (0.125 mg total) by mouth daily. 30 tablet 0  . levothyroxine (SYNTHROID) 100 MCG tablet Take 1 tablet (100 mcg total) by mouth daily before breakfast. 30 tablet 0  . magnesium oxide (MAG-OX) 400 (241.3 Mg) MG tablet Take 0.5 tablets (200 mg total) by mouth 2 (two) times daily. 30 tablet 0  . Melatonin 3 MG TABS Take 1 tablet (3 mg total) by mouth at bedtime as needed (PRN sleep). 10 tablet 0  . metoprolol succinate (TOPROL XL) 50 MG 24 hr tablet Take 1 tablet (50 mg total) by mouth daily. Take with or immediately following a meal. 30 tablet 11  . nystatin (MYCOSTATIN/NYSTOP) powder Apply topically 2 (two) times daily. 15 g 0  . polyethylene glycol (MIRALAX / GLYCOLAX) 17 g packet Take 17 g by mouth daily as needed for mild constipation. 14 each 0  .  potassium chloride SA (K-DUR) 20 MEQ tablet Take 2 tablets (40 mEq total) by mouth daily. 30 tablet 0  . Spacer/Aero-Holding Chambers (AEROCHAMBER MV) inhaler Use as instructed 1 each 0  . torsemide (DEMADEX) 20 MG tablet Take 1 tablet (20 mg total) by mouth 2 (two) times daily. 60 tablet 0   No current facility-administered medications for this visit.     Allergies:   Ambien [zolpidem tartrate], Aspirin, Nsaids, and Tolmetin    ROS:  Please see the history of present illness.   Otherwise, review  of systems are positive for {NONE DEFAULTED:18576::"none"}.   All other systems are reviewed and negative.    PHYSICAL EXAM: VS:  There were no vitals taken for this visit. , BMI There is no height or weight on file to calculate BMI. GENERAL:  Well appearing NECK:  No jugular venous distention, waveform within normal limits, carotid upstroke brisk and symmetric, no bruits, no thyromegaly LUNGS:  Clear to auscultation bilaterally CHEST:  Unremarkable HEART:  PMI not displaced or sustained,S1 and S2 within normal limits, no S3, no S4, no clicks, no rubs, *** murmurs ABD:  Flat, positive bowel sounds normal in frequency in pitch, no bruits, no rebound, no guarding, no midline pulsatile mass, no hepatomegaly, no splenomegaly EXT:  2 plus pulses throughout, no edema, no cyanosis no clubbing    ***GENERAL:  Well appearing HEENT:  Pupils equal round and reactive, fundi not visualized, oral mucosa unremarkable NECK:  No jugular venous distention, waveform within normal limits, carotid upstroke brisk and symmetric, no bruits, no thyromegaly LYMPHATICS:  No cervical, inguinal adenopathy LUNGS:  Clear to auscultation bilaterally BACK:  No CVA tenderness CHEST:  Unremarkable HEART:  PMI not displaced or sustained,S1 and S2 within normal limits, no S3, no S4, no clicks, no rubs, *** murmurs ABD:  Flat, positive bowel sounds normal in frequency in pitch, no bruits, no rebound, no guarding, no midline  pulsatile mass, no hepatomegaly, no splenomegaly EXT:  2 plus pulses throughout, no edema, no cyanosis no clubbing SKIN:  No rashes no nodules NEURO:  Cranial nerves II through XII grossly intact, motor grossly intact throughout PSYCH:  Cognitively intact, oriented to person place and time    EKG:  EKG {ACTION; IS/IS [id number] ordered today. The ekg ordered today demonstrates ***   Recent Labs: 11/01/2018: TSH 8.630 01/10/2019: Magnesium 1.8 02/02/2019: ALT 28; B Natriuretic Peptide 1,480.7 02/05/2019: Hemoglobin 11.5; Platelets 184 02/11/2019: BUN 27; Creatinine, Ser 1.34; Potassium 4.4; Sodium 133    Lipid Panel    Component Value Date/Time   CHOL 226 (H) 06/07/2018 1450   TRIG 213 (H) 06/07/2018 1450   HDL 33 (L) 06/07/2018 1450   CHOLHDL 6.8 (H) 06/07/2018 1450   CHOLHDL 6.4 (H) 02/26/2016 1133   VLDL 33 (H) 02/26/2016 1133   LDLCALC 150 (H) 06/07/2018 1450   LDLDIRECT 182.5 09/12/2010 0812      Wt Readings from Last 3 Encounters:  02/13/19 188 lb 9.6 oz (85.5 kg)  01/13/19 205 lb 0.4 oz (93 kg)  12/01/18 195 lb (88.5 kg)      Other studies Reviewed: Additional studies/ records that were reviewed today include: ***. Review of the above records demonstrates:  Please see elsewhere in the note.  ***   ASSESSMENT AND PLAN:  Atrial fibrillation:   ***  As this is telephone visit, unable to document her rhythm. She states that she can feel when her HR is irregular and does not feel this now. She is asymptomatic, no bleeding with apixaban.   I will have her next appointment be in person, to check her BP and do EKG whenit is safe for her to leave her home and come to the office. No changes in her regimen.   Hypertension:   ***  Unable to document BP response to medication changes as she does not have BP cuff at home. I recommended that her son get a BP cuff and take it for her at least once a day and record. She is to call us for symptoms of dizziness,  near syncope or  elevated BP, such as headache or chest pressure.    Current medicines are reviewed at length with the patient today.  The patient {ACTIONS; HAS/DOES NOT HAVE:19233} concerns regarding medicines.  The following changes have been made:  {PLAN; NO CHANGE:13088:s}  Labs/ tests ordered today include: *** No orders of the defined types were placed in this encounter.    Disposition:   FU with ***    Signed, Minus Breeding, MD  02/27/2019 3:39 PM    [facility]

## 2019-02-28 ENCOUNTER — Ambulatory Visit: Payer: Medicare Other | Admitting: Cardiology

## 2019-03-04 ENCOUNTER — Ambulatory Visit: Payer: Medicare Other | Admitting: Family Medicine

## 2019-10-19 NOTE — Telephone Encounter (Signed)
See prior CRM

## 2020-07-24 IMAGING — US US RENAL
1 series · 14 of 25 positions shown · non-contrast
Comparison: MRI 09/30/2016

CLINICAL DATA: Acute renal failure

EXAM:
RENAL / URINARY TRACT ULTRASOUND COMPLETE

[Series 1: us renal · 14 of 32 slices shown]
[im 1/32]
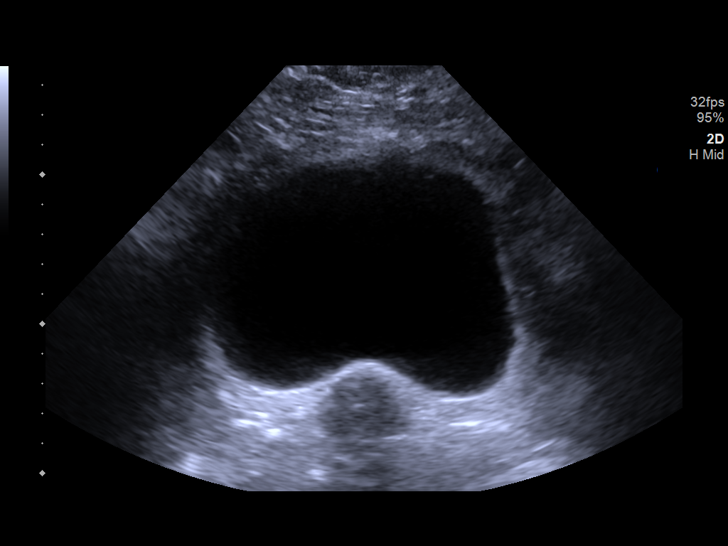
[im 3/32]
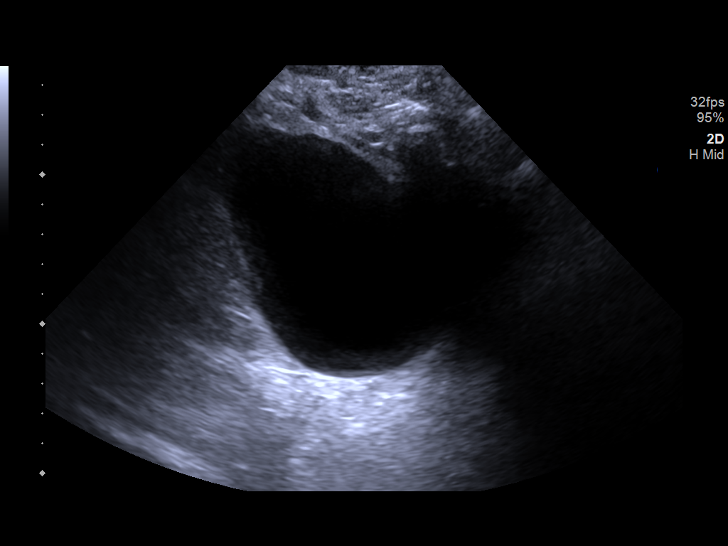
[im 6/32]
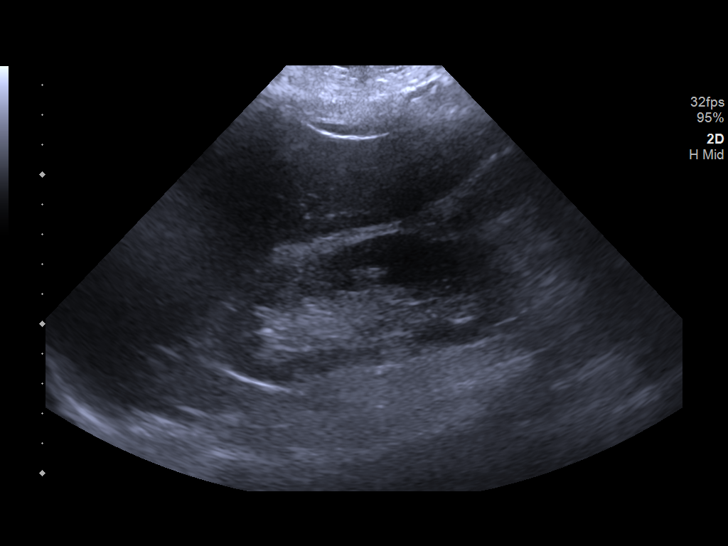
[im 8/32]
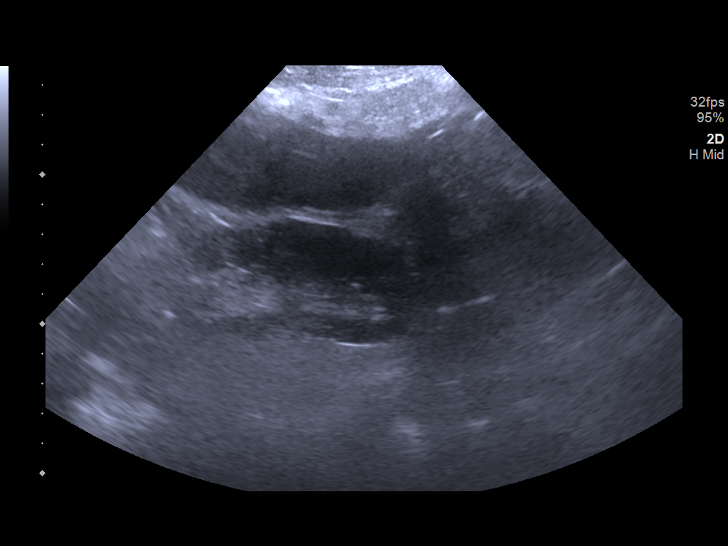
[im 11/32]
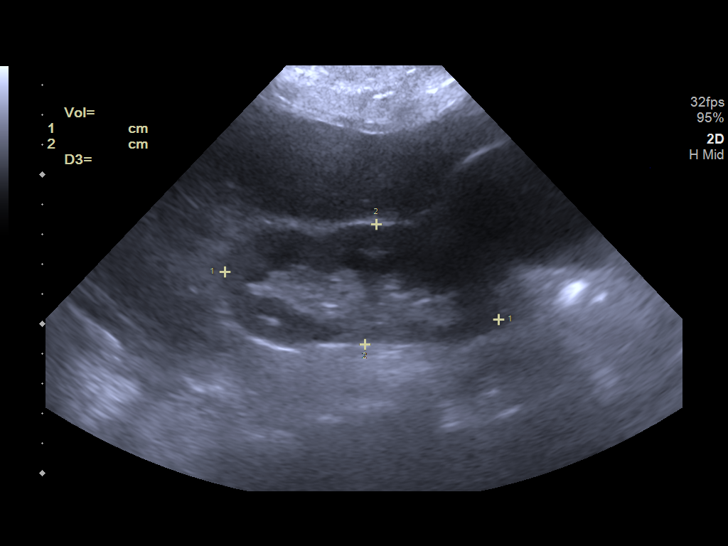
[im 12/32]
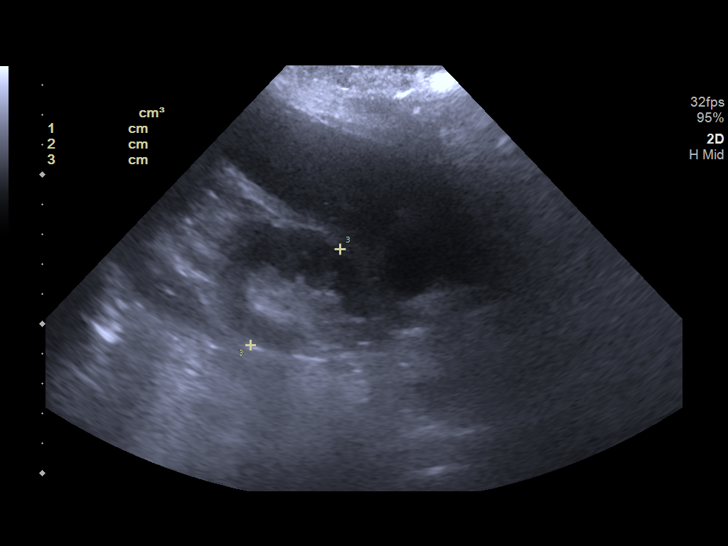
[im 15/32]
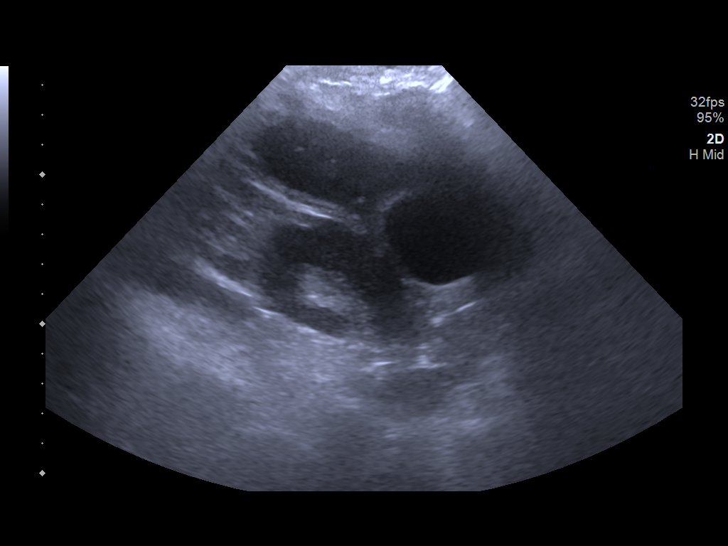
[im 17/32]
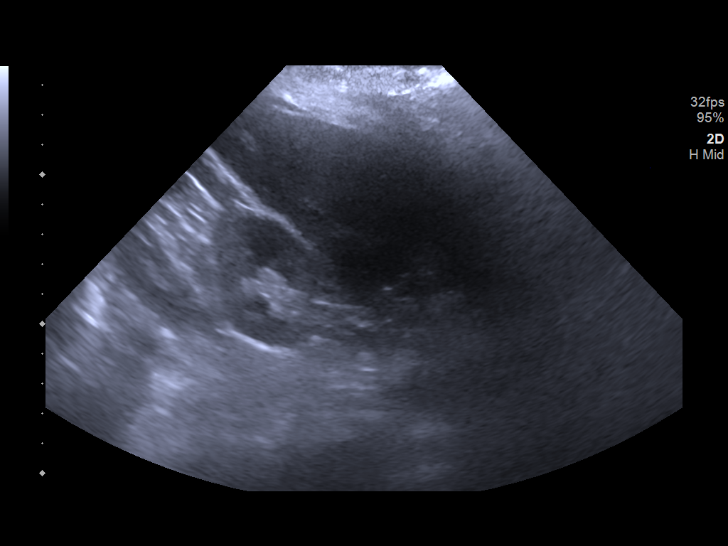
[im 20/32]
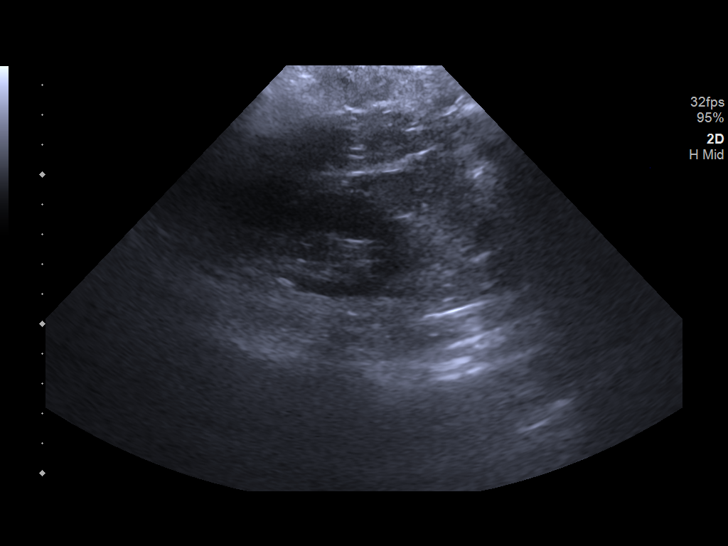
[im 21/32]
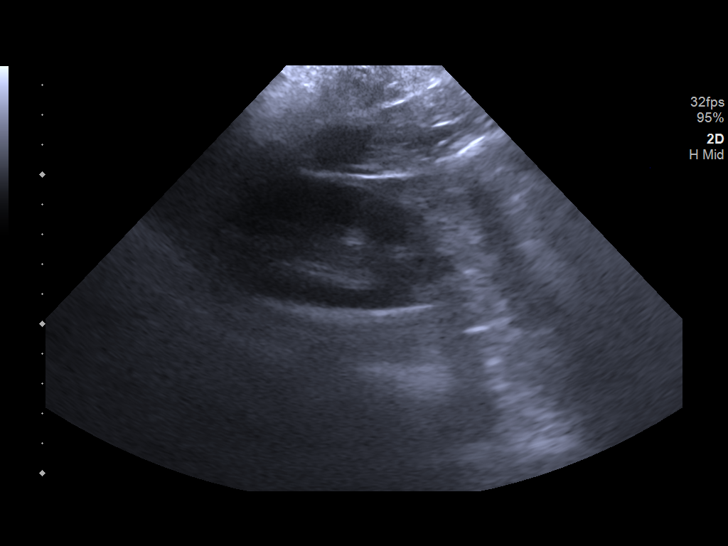
[im 24/32]
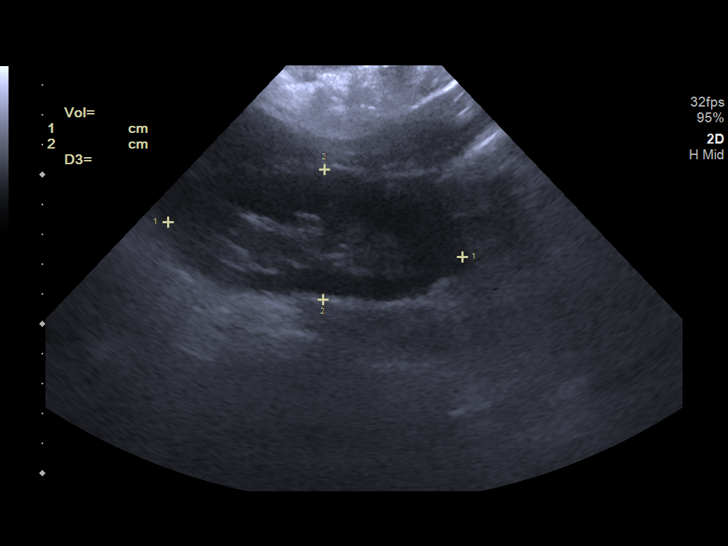
[im 26/32]
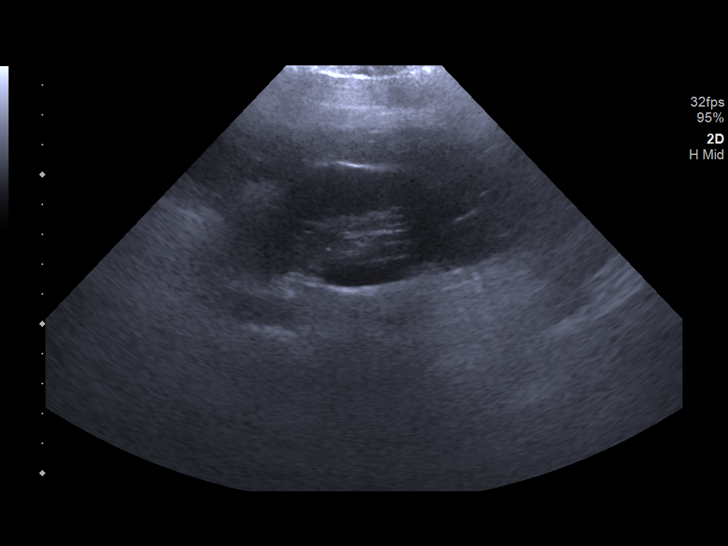
[im 29/32]
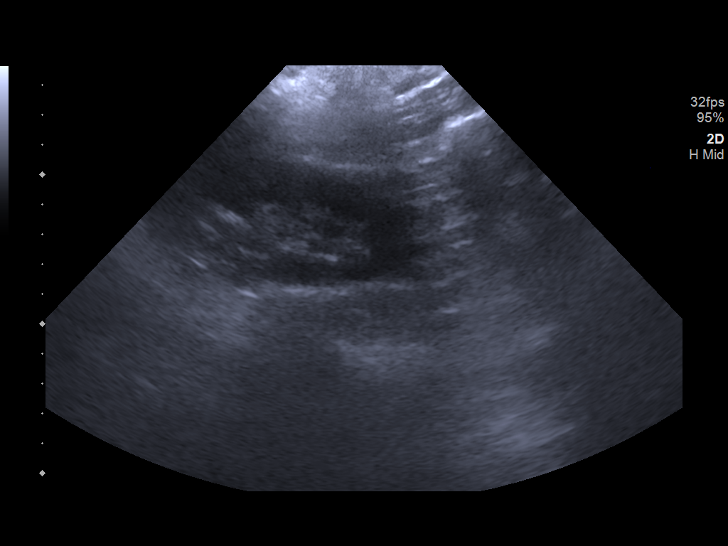
[im 32/32]
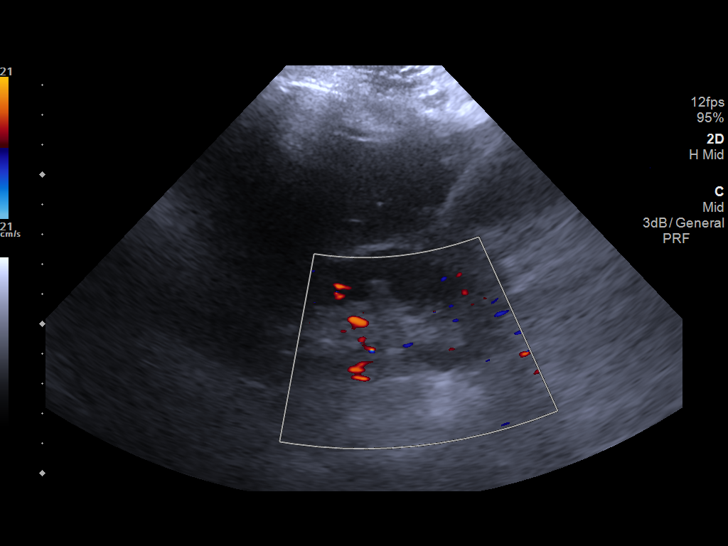

[14 of 25 positions shown; findings below may reference images not displayed]

FINDINGS: Right Kidney:

Renal measurements: 9.3 x 4.0 x 4.4 cm = volume: 87 mL .
Echogenicity within normal limits. No mass or hydronephrosis
visualized.

Left Kidney:

Renal measurements: 9.9 x 4.3 x 4.0 cm = volume: 91 mL. Echogenicity
within normal limits. No mass or hydronephrosis visualized.

Bladder:

Appears normal for degree of bladder distention.
IMPRESSION: Unremarkable renal ultrasound.

## 2023-06-03 ENCOUNTER — Emergency Department (HOSPITAL_COMMUNITY)
Admission: EM | Admit: 2023-06-03 | Discharge: 2023-06-03 | Disposition: A | Discharge status: Home / Self Care | Payer: Medicare Other | Attending: Emergency Medicine | Admitting: Emergency Medicine

## 2023-06-03 DIAGNOSIS — I5022 Chronic systolic (congestive) heart failure: Secondary | ICD-10-CM | POA: Diagnosis not present

## 2023-06-03 DIAGNOSIS — Z7901 Long term (current) use of anticoagulants: Secondary | ICD-10-CM | POA: Diagnosis not present

## 2023-06-03 DIAGNOSIS — E119 Type 2 diabetes mellitus without complications: Secondary | ICD-10-CM | POA: Diagnosis not present

## 2023-06-03 DIAGNOSIS — R451 Restlessness and agitation: Secondary | ICD-10-CM | POA: Diagnosis present

## 2023-06-03 LAB — CBC
HCT: 36.3 % (ref 36.0–46.0)
Hemoglobin: 12.3 g/dL (ref 12.0–15.0)
MCH: 34.3 pg — ABNORMAL HIGH (ref 26.0–34.0)
MCHC: 33.9 g/dL (ref 30.0–36.0)
MCV: 101.1 fL — ABNORMAL HIGH (ref 80.0–100.0)
Platelets: 172 10*3/uL (ref 150–400)
RBC: 3.59 MIL/uL — ABNORMAL LOW (ref 3.87–5.11)
RDW: 13.4 % (ref 11.5–15.5)
WBC: 8.4 10*3/uL (ref 4.0–10.5)
nRBC: 0 % (ref 0.0–0.2)

## 2023-06-03 LAB — BASIC METABOLIC PANEL
Anion gap: 13 (ref 5–15)
BUN: 43 mg/dL — ABNORMAL HIGH (ref 8–23)
CO2: 27 mmol/L (ref 22–32)
Calcium: 8.9 mg/dL (ref 8.9–10.3)
Chloride: 94 mmol/L — ABNORMAL LOW (ref 98–111)
Creatinine, Ser: 1.18 mg/dL — ABNORMAL HIGH (ref 0.44–1.00)
GFR, Estimated: 46 mL/min — ABNORMAL LOW (ref >60)
Glucose, Bld: 200 mg/dL — ABNORMAL HIGH (ref 70–99)
Potassium: 3.4 mmol/L — ABNORMAL LOW (ref 3.5–5.1)
Sodium: 134 mmol/L — ABNORMAL LOW (ref 135–145)

## 2023-06-03 LAB — URINALYSIS, ROUTINE W REFLEX MICROSCOPIC
Bilirubin Urine: NEGATIVE
Glucose, UA: 150 mg/dL — AB
Hgb urine dipstick: NEGATIVE
Ketones, ur: NEGATIVE mg/dL
Leukocytes,Ua: NEGATIVE
Nitrite: NEGATIVE
Protein, ur: NEGATIVE mg/dL
Specific Gravity, Urine: 1.012 (ref 1.005–1.030)
pH: 6 (ref 5.0–8.0)
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
## 2023-06-03 NOTE — ED Notes (Signed)
[facility] contacted 

## 2023-06-03 NOTE — Discharge Instructions (Signed)
Please have the patient assessed by her nurse practitioner in the morning.  Patient may return to ED with any new or worsening symptoms.

## 2023-06-03 NOTE — ED Triage Notes (Signed)
Pt arrives EMS from [facility] with reports of agitation tonight. Staff and EMS report pt was combative with staff, yelling and trying to go into other pts rooms. Pt arrives calm, alert and oriented x4. Pt reports "I know if I want to go home I need to behave and follow the rules." Pt reports she remembers being upset due to them wanting her to come to the hospital. Pt reports "my son thinks I have a UTI when I get upset."

## 2024-04-24 ENCOUNTER — Emergency Department (HOSPITAL_COMMUNITY)

## 2024-04-24 ENCOUNTER — Other Ambulatory Visit: Payer: Self-pay

## 2024-04-24 ENCOUNTER — Encounter (HOSPITAL_COMMUNITY): Payer: Self-pay | Admitting: Emergency Medicine

## 2024-04-24 ENCOUNTER — Emergency Department (HOSPITAL_COMMUNITY)
Admission: EM | Admit: 2024-04-24 | Discharge: 2024-04-24 | Disposition: A | Discharge status: Skilled Nursing Facility | Attending: Emergency Medicine | Admitting: Emergency Medicine

## 2024-04-24 DIAGNOSIS — F0390 Unspecified dementia without behavioral disturbance: Secondary | ICD-10-CM | POA: Insufficient documentation

## 2024-04-24 DIAGNOSIS — Z23 Encounter for immunization: Secondary | ICD-10-CM | POA: Insufficient documentation

## 2024-04-24 DIAGNOSIS — W19XXXA Unspecified fall, initial encounter: Secondary | ICD-10-CM | POA: Insufficient documentation

## 2024-04-24 DIAGNOSIS — Z7901 Long term (current) use of anticoagulants: Secondary | ICD-10-CM | POA: Diagnosis not present

## 2024-04-24 DIAGNOSIS — S0990XA Unspecified injury of head, initial encounter: Secondary | ICD-10-CM | POA: Diagnosis present

## 2024-04-24 DIAGNOSIS — S0101XA Laceration without foreign body of scalp, initial encounter: Secondary | ICD-10-CM | POA: Insufficient documentation

## 2024-04-24 LAB — I-STAT CHEM 8, ED
BUN: 20 mg/dL (ref 8–23)
Calcium, Ion: 1.13 mmol/L — ABNORMAL LOW (ref 1.15–1.40)
Chloride: 107 mmol/L (ref 98–111)
Creatinine, Ser: 1.4 mg/dL — ABNORMAL HIGH (ref 0.44–1.00)
Glucose, Bld: 128 mg/dL — ABNORMAL HIGH (ref 70–99)
HCT: 34 % — ABNORMAL LOW (ref 36.0–46.0)
Hemoglobin: 11.6 g/dL — ABNORMAL LOW (ref 12.0–15.0)
Potassium: 4.3 mmol/L (ref 3.5–5.1)
Sodium: 142 mmol/L (ref 135–145)
TCO2: 25 mmol/L (ref 22–32)

## 2024-04-24 LAB — CBC WITH DIFFERENTIAL/PLATELET
Abs Immature Granulocytes: 0.15 K/uL — ABNORMAL HIGH (ref 0.00–0.07)
Basophils Absolute: 0.1 K/uL (ref 0.0–0.1)
Basophils Relative: 1 %
Eosinophils Absolute: 0.3 K/uL (ref 0.0–0.5)
Eosinophils Relative: 3 %
HCT: 36.5 % (ref 36.0–46.0)
Hemoglobin: 11.2 g/dL — ABNORMAL LOW (ref 12.0–15.0)
Immature Granulocytes: 2 %
Lymphocytes Relative: 18 %
Lymphs Abs: 1.5 K/uL (ref 0.7–4.0)
MCH: 32.8 pg (ref 26.0–34.0)
MCHC: 30.7 g/dL (ref 30.0–36.0)
MCV: 107 fL — ABNORMAL HIGH (ref 80.0–100.0)
Monocytes Absolute: 1 K/uL (ref 0.1–1.0)
Monocytes Relative: 12 %
Neutro Abs: 5.3 K/uL (ref 1.7–7.7)
Neutrophils Relative %: 64 %
Platelets: 165 K/uL (ref 150–400)
RBC: 3.41 MIL/uL — ABNORMAL LOW (ref 3.87–5.11)
RDW: 14.9 % (ref 11.5–15.5)
WBC: 8.4 K/uL (ref 4.0–10.5)
nRBC: 0 % (ref 0.0–0.2)

## 2024-04-24 MED ORDER — TETANUS-DIPHTH-ACELL PERTUSSIS 5-2-15.5 LF-MCG/0.5 IM SUSP
0.5000 mL | Freq: Once | INTRAMUSCULAR | Status: AC
  Administered 2024-04-24: 0.5 mL via INTRAMUSCULAR
  Filled 2024-04-24: qty 0.5

## 2024-04-24 MED ORDER — LIDOCAINE-EPINEPHRINE-TETRACAINE (LET) TOPICAL GEL
3.0000 mL | Freq: Once | TOPICAL | Status: AC
  Administered 2024-04-24: 3 mL via TOPICAL
  Filled 2024-04-24: qty 3

## 2024-04-24 NOTE — ED Triage Notes (Signed)
 Pt BIB EMS from [facility]. Pt was getting up to go the bathroom and had a mechanical fall. She did hit her head and she is on blood thinners, eliquis . Hx dementia. GCS 15  152/80 105P 165cbg

## 2024-04-24 NOTE — ED Provider Notes (Signed)
 [place] EMERGENCY DEPARTMENT AT [facility] Provider Note   CSN: [id number] Arrival date & time: 04/24/24  9748     Patient presents with: Teresa [name] is a 83 y.o. female.   The history is provided by the EMS personnel. The history is limited by the condition of the patient (level 5 caveat dementia).  Fall This is a new problem. The current episode started less than 1 hour ago. The problem occurs constantly. The problem has been resolved. Pertinent negatives include no chest pain. Associated symptoms comments: 2 scalp laceration on eliquis . Nothing aggravates the symptoms. Nothing relieves the symptoms. She has tried nothing for the symptoms. The treatment provided no relief.       Prior to Admission medications   Medication Sig Start Date End Date Taking? Authorizing Provider  albuterol  (PROAIR  HFA) 108 (90 Base) MCG/ACT inhaler USE 2 PUFFS EVERY 6 HOURS AS NEEDED FOR SHORTNESS OF BREATH AND WHEEZING. 02/13/19   Rosario Leatrice FERNS, MD  apixaban  (ELIQUIS ) 5 MG TABS tablet Take 1 tablet (5 mg total) by mouth 2 (two) times daily. 02/13/19   Rosario Leatrice FERNS, MD  busPIRone  (BUSPAR ) 7.5 MG tablet Take 1 tablet (7.5 mg total) by mouth daily. 02/13/19   Rosario Leatrice FERNS, MD  clonazePAM  (KLONOPIN ) 0.25 MG disintegrating tablet Take 1 tablet (0.25 mg total) by mouth 2 (two) times daily as needed (anxiety . avoid during bedtime). 02/13/19   Rosario Leatrice FERNS, MD  digoxin  (LANOXIN ) 0.125 MG tablet Take 1 tablet (0.125 mg total) by mouth daily. 02/13/19   Rosario Leatrice FERNS, MD  levothyroxine  (SYNTHROID ) 100 MCG tablet Take 1 tablet (100 mcg total) by mouth daily before breakfast. 02/13/19   Rosario Leatrice FERNS, MD  magnesium  oxide (MAG-OX) 400 (241.3 Mg) MG tablet Take 0.5 tablets (200 mg total) by mouth 2 (two) times daily. 02/13/19   Rosario Leatrice FERNS, MD  Melatonin 3 MG TABS Take 1 tablet (3 mg total) by mouth at bedtime as needed (PRN sleep). 02/13/19   Rosario Leatrice FERNS, MD  metoprolol  succinate (TOPROL  XL) 50 MG 24 hr tablet Take 1 tablet (50 mg total) by mouth daily. Take with or immediately following a meal. 02/13/19 03/15/19  Rosario Leatrice FERNS, MD  nystatin  (MYCOSTATIN /NYSTOP ) powder Apply topically 2 (two) times daily. 02/13/19   Rosario Leatrice FERNS, MD  polyethylene glycol (MIRALAX  / GLYCOLAX ) 17 g packet Take 17 g by mouth daily as needed for mild constipation. 02/13/19   Rosario Leatrice FERNS, MD  potassium chloride  SA (K-DUR) 20 MEQ tablet Take 2 tablets (40 mEq total) by mouth daily. 02/13/19   Rosario Leatrice FERNS, MD  Spacer/Aero-Holding Chambers (AEROCHAMBER MV) inhaler Use as instructed 05/01/16   Nestor, Jennings E, MD  torsemide  (DEMADEX ) 20 MG tablet Take 1 tablet (20 mg total) by mouth 2 (two) times daily. 02/13/19   Rosario Leatrice FERNS, MD    Allergies: Ambien  [zolpidem  tartrate], Aspirin, Nsaids, and Tolmetin    Review of Systems  Unable to perform ROS: Dementia  Cardiovascular:  Negative for chest pain.  Skin:  Positive for wound.    Updated Vital Signs BP 129/73   Pulse 97   Temp 97.8 F (36.6 C)   Resp 20   Ht 5' 3 (1.6 m)   Wt 86.2 kg   SpO2 97%   BMI 33.66 kg/m   Physical Exam Vitals and nursing note reviewed.  Constitutional:      General: She is not in acute distress.  Appearance: She is well-developed.  HENT:     Head: Normocephalic.      Nose: Nose normal.     Mouth/Throat:     Mouth: Mucous membranes are moist.     Pharynx: Oropharynx is clear.  Eyes:     Pupils: Pupils are equal, round, and reactive to light.  Cardiovascular:     Rate and Rhythm: Normal rate and regular rhythm.     Pulses: Normal pulses.     Heart sounds: Normal heart sounds.  Pulmonary:     Effort: Pulmonary effort is normal. No respiratory distress.     Breath sounds: Normal breath sounds.  Abdominal:     General: Bowel sounds are normal. There is no distension.     Palpations: Abdomen is soft.     Tenderness: There is no  abdominal tenderness. There is no guarding or rebound.  Musculoskeletal:        General: Normal range of motion.     Cervical back: Neck supple.  Skin:    General: Skin is warm and dry.     Capillary Refill: Capillary refill takes less than 2 seconds.     Findings: No erythema or rash.  Neurological:     Deep Tendon Reflexes: Reflexes normal.  Psychiatric:        Mood and Affect: Mood normal.     (all labs ordered are listed, but only abnormal results are displayed) Results for orders placed or performed during the hospital encounter of 04/24/24  CBC with Differential   Collection Time: 04/24/24  3:08 AM  Result Value Ref Range   WBC 8.4 4.0 - 10.5 K/uL   RBC 3.41 (L) 3.87 - 5.11 MIL/uL   Hemoglobin 11.2 (L) 12.0 - 15.0 g/dL   HCT 63.4 63.9 - 53.9 %   MCV 107.0 (H) 80.0 - 100.0 fL   MCH 32.8 26.0 - 34.0 pg   MCHC 30.7 30.0 - 36.0 g/dL   RDW 85.0 88.4 - 84.4 %   Platelets 165 150 - 400 K/uL   nRBC 0.0 0.0 - 0.2 %   Neutrophils Relative % 64 %   Neutro Abs 5.3 1.7 - 7.7 K/uL   Lymphocytes Relative 18 %   Lymphs Abs 1.5 0.7 - 4.0 K/uL   Monocytes Relative 12 %   Monocytes Absolute 1.0 0.1 - 1.0 K/uL   Eosinophils Relative 3 %   Eosinophils Absolute 0.3 0.0 - 0.5 K/uL   Basophils Relative 1 %   Basophils Absolute 0.1 0.0 - 0.1 K/uL   Immature Granulocytes 2 %   Abs Immature Granulocytes 0.15 (H) 0.00 - 0.07 K/uL  I-stat chem 8, ED (not at [facility], [facility] or [facility])   Collection Time: 04/24/24  3:11 AM  Result Value Ref Range   Sodium 142 135 - 145 mmol/L   Potassium 4.3 3.5 - 5.1 mmol/L   Chloride 107 98 - 111 mmol/L   BUN 20 8 - 23 mg/dL   Creatinine, Ser 8.59 (H) 0.44 - 1.00 mg/dL   Glucose, Bld 871 (H) 70 - 99 mg/dL   Calcium , Ion 1.13 (L) 1.15 - 1.40 mmol/L   TCO2 25 22 - 32 mmol/L   Hemoglobin 11.6 (L) 12.0 - 15.0 g/dL   HCT 65.9 (L) 63.9 - 53.9 %   CT Cervical Spine Wo Contrast Result Date: 04/24/2024 EXAM: CT CERVICAL SPINE WITHOUT CONTRAST 04/24/2024 03:36:59 AM  TECHNIQUE: CT of the cervical spine was performed without the administration of intravenous contrast. Multiplanar reformatted images are  provided for review. Automated exposure control, iterative reconstruction, and/or weight based adjustment of the mA/kV was utilized to reduce the radiation dose to as low as reasonably achievable. COMPARISON: None available. CLINICAL HISTORY: Polytrauma, blunt. FINDINGS: CERVICAL SPINE: BONES AND ALIGNMENT: No acute fracture or traumatic malalignment. DEGENERATIVE CHANGES: Mild degenerative changes of the mid cervical spine. SOFT TISSUES: No prevertebral soft tissue swelling. IMPRESSION: 1. No acute abnormality of the cervical spine. Electronically signed by: Pinkie Pebbles MD 04/24/2024 03:40 AM EST RP Workstation: [id number]   CT Head Wo Contrast Result Date: 04/24/2024 EXAM: CT HEAD WITHOUT CONTRAST 04/24/2024 03:36:59 AM TECHNIQUE: CT of the head was performed without the administration of intravenous contrast. Automated exposure control, iterative reconstruction, and/or weight based adjustment of the mA/kV was utilized to reduce the radiation dose to as low as reasonably achievable. COMPARISON: Comparison 02/10/2019. CLINICAL HISTORY: Polytrauma, blunt. FINDINGS: BRAIN AND VENTRICLES: No acute hemorrhage. No evidence of acute infarct. Periventricular white matter decreased attenuation consistent with small vessel ischemic changes. Age-related atrophy with prominent ventricles, sulci and cisterns. No extra-axial collection. No mass effect or midline shift. Intracranial atherosclerosis. ORBITS: No acute abnormality. SINUSES: Near complete opacification of left maxillary sinus. SOFT TISSUES AND SKULL: No acute soft tissue abnormality. No skull fracture. IMPRESSION: 1. No acute intracranial abnormality. Electronically signed by: Pinkie Pebbles MD 04/24/2024 03:39 AM EST RP Workstation: [id number]   DG Chest Portable 1 View Result Date: 04/24/2024 EXAM: 1 VIEW(S) XRAY OF  THE CHEST 04/24/2024 03:14:00 AM COMPARISON: 02/03/2019 CLINICAL HISTORY: fall on thinners FINDINGS: LUNGS AND PLEURA: No focal pulmonary opacity. No pulmonary edema. Stable chronic blunting of the left costophrenic angle. No pneumothorax. HEART AND MEDIASTINUM: Stable cardiomegaly. Thoracic aortic atherosclerosis. BONES AND SOFT TISSUES: No acute osseous abnormality. IMPRESSION: 1. No acute cardiopulmonary process. Electronically signed by: Pinkie Pebbles MD 04/24/2024 03:17 AM EST RP Workstation: [id number]    None  Radiology: CT Cervical Spine Wo Contrast Result Date: 04/24/2024 EXAM: CT CERVICAL SPINE WITHOUT CONTRAST 04/24/2024 03:36:59 AM TECHNIQUE: CT of the cervical spine was performed without the administration of intravenous contrast. Multiplanar reformatted images are provided for review. Automated exposure control, iterative reconstruction, and/or weight based adjustment of the mA/kV was utilized to reduce the radiation dose to as low as reasonably achievable. COMPARISON: None available. CLINICAL HISTORY: Polytrauma, blunt. FINDINGS: CERVICAL SPINE: BONES AND ALIGNMENT: No acute fracture or traumatic malalignment. DEGENERATIVE CHANGES: Mild degenerative changes of the mid cervical spine. SOFT TISSUES: No prevertebral soft tissue swelling. IMPRESSION: 1. No acute abnormality of the cervical spine. Electronically signed by: Pinkie Pebbles MD 04/24/2024 03:40 AM EST RP Workstation: [id number]   CT Head Wo Contrast Result Date: 04/24/2024 EXAM: CT HEAD WITHOUT CONTRAST 04/24/2024 03:36:59 AM TECHNIQUE: CT of the head was performed without the administration of intravenous contrast. Automated exposure control, iterative reconstruction, and/or weight based adjustment of the mA/kV was utilized to reduce the radiation dose to as low as reasonably achievable. COMPARISON: Comparison 02/10/2019. CLINICAL HISTORY: Polytrauma, blunt. FINDINGS: BRAIN AND VENTRICLES: No acute hemorrhage. No evidence of  acute infarct. Periventricular white matter decreased attenuation consistent with small vessel ischemic changes. Age-related atrophy with prominent ventricles, sulci and cisterns. No extra-axial collection. No mass effect or midline shift. Intracranial atherosclerosis. ORBITS: No acute abnormality. SINUSES: Near complete opacification of left maxillary sinus. SOFT TISSUES AND SKULL: No acute soft tissue abnormality. No skull fracture. IMPRESSION: 1. No acute intracranial abnormality. Electronically signed by: Pinkie Pebbles MD 04/24/2024 03:39 AM EST RP Workstation: [id number]   DG Chest Portable 1 View Result  Date: 04/24/2024 EXAM: 1 VIEW(S) XRAY OF THE CHEST 04/24/2024 03:14:00 AM COMPARISON: 02/03/2019 CLINICAL HISTORY: fall on thinners FINDINGS: LUNGS AND PLEURA: No focal pulmonary opacity. No pulmonary edema. Stable chronic blunting of the left costophrenic angle. No pneumothorax. HEART AND MEDIASTINUM: Stable cardiomegaly. Thoracic aortic atherosclerosis. BONES AND SOFT TISSUES: No acute osseous abnormality. IMPRESSION: 1. No acute cardiopulmonary process. Electronically signed by: Pinkie Pebbles MD 04/24/2024 03:17 AM EST RP Workstation: [id number]     .Laceration Repair  Date/Time: 04/24/2024 4:13 AM  Performed by: Nettie Earing, MD Authorized by: Nettie Earing, MD   Consent:    Consent obtained:  Verbal   Consent given by:  Patient   Risks discussed:  Infection, need for additional repair, nerve damage, pain and poor cosmetic result   Alternatives discussed:  No treatment Universal protocol:    Patient identity confirmed:  Arm band Anesthesia:    Anesthesia method:  Topical application   Topical anesthetic:  LET Laceration details:    Location:  Scalp   Scalp location:  R parietal   Length (cm):  1   Depth (mm):  0.5 Pre-procedure details:    Preparation:  Patient was prepped and draped in usual sterile fashion Exploration:    Imaging obtained: x-ray     Imaging outcome:  foreign body not noted     Wound extent: fascia not violated     Contaminated: no   Treatment:    Area cleansed with:  Povidone-iodine, chlorhexidine  and saline   Amount of cleaning:  Extensive   Irrigation solution:  Sterile saline   Debridement:  None   Undermining:  None Skin repair:    Repair method:  Staples   Number of staples:  3 Approximation:    Approximation:  Close Repair type:    Repair type:  Simple Post-procedure details:    Dressing:  Open (no dressing)   Procedure completion:  Tolerated well, no immediate complications .Laceration Repair  Date/Time: 04/24/2024 4:17 AM  Performed by: Nettie Earing, MD Authorized by: Nettie Earing, MD   Consent:    Consent obtained:  Verbal   Risks discussed:  Need for additional repair, nerve damage, pain, poor cosmetic result and poor wound healing Universal protocol:    Patient identity confirmed:  Arm band Anesthesia:    Anesthesia method:  Topical application   Topical anesthetic:  LET Laceration details:    Location:  Scalp   Length (cm):  2   Depth (mm):  1 Pre-procedure details:    Preparation:  Patient was prepped and draped in usual sterile fashion Exploration:    Hemostasis achieved with:  Direct pressure   Wound extent: fascia not violated   Treatment:    Area cleansed with:  Povidone-iodine, chlorhexidine  and saline   Amount of cleaning:  Extensive   Irrigation solution:  Sterile saline Skin repair:    Repair method:  Staples   Number of staples:  3 Approximation:    Approximation:  Close Repair type:    Repair type:  Simple Post-procedure details:    Dressing:  Open (no dressing)   Procedure completion:  Tolerated well, no immediate complications    Medications Ordered in the ED  lidocaine -EPINEPHrine -tetracaine (LET) topical gel (3 mLs Topical Given 04/24/24 0302)  Tdap (ADACEL) injection 0.5 mL (0.5 mLs Intramuscular Given 04/24/24 0301)  Medical Decision  Making Fall on thinners eliquis    Amount and/or Complexity of Data Reviewed Independent Historian: EMS    Details: See above  External Data Reviewed: notes.    Details: Previous notes reviewed  Labs: ordered.    Details: Normal white 8.4, hemoglobin slight low 11.2, normal platelets.  Normal sodium 142, normal potassium 4.3, creatinine slight elevation 1.4  Radiology: ordered and independent interpretation performed.    Details: Negative head CT  Risk Prescription drug management. Risk Details: Stapled in the ED head, stapled in the ED.  Stable for discharge.       Final diagnoses:  Fall, initial encounter  Laceration of scalp, initial encounter    No signs of systemic illness or infection. The patient is nontoxic-appearing on exam and vital signs are within normal limits.  I have reviewed the triage vital signs and the nursing notes. Pertinent labs & imaging results that were available during my care of the patient were reviewed by me and considered in my medical decision making (see chart for details). After history, exam, and medical workup I feel the patient has been appropriately medically screened and is safe for discharge home. Pertinent diagnoses were discussed with the patient. Patient was given return precautions.      ED Discharge Orders     None          Demetreus Lothamer, MD 04/24/24 [phone]
